# Patient Record
Sex: Female | Born: 1985 | Race: Black or African American | Hispanic: No | Marital: Single | Smoking: Current every day smoker
Health system: Southern US, Community
[De-identification: ages and names within clinical notes are randomized; demographics above are authoritative.]

## PROBLEM LIST (undated history)

## (undated) ENCOUNTER — Inpatient Hospital Stay (HOSPITAL_COMMUNITY): Payer: Self-pay

## (undated) DIAGNOSIS — F329 Major depressive disorder, single episode, unspecified: Secondary | ICD-10-CM

## (undated) DIAGNOSIS — J45909 Unspecified asthma, uncomplicated: Secondary | ICD-10-CM

## (undated) DIAGNOSIS — F32A Depression, unspecified: Secondary | ICD-10-CM

## (undated) DIAGNOSIS — R87629 Unspecified abnormal cytological findings in specimens from vagina: Secondary | ICD-10-CM

## (undated) DIAGNOSIS — F419 Anxiety disorder, unspecified: Secondary | ICD-10-CM

## (undated) DIAGNOSIS — C801 Malignant (primary) neoplasm, unspecified: Secondary | ICD-10-CM

## (undated) DIAGNOSIS — M1990 Unspecified osteoarthritis, unspecified site: Secondary | ICD-10-CM

## (undated) DIAGNOSIS — D649 Anemia, unspecified: Secondary | ICD-10-CM

## (undated) DIAGNOSIS — R519 Headache, unspecified: Secondary | ICD-10-CM

## (undated) DIAGNOSIS — I10 Essential (primary) hypertension: Secondary | ICD-10-CM

## (undated) HISTORY — PX: NO PAST SURGERIES: SHX2092

## (undated) HISTORY — DX: Unspecified abnormal cytological findings in specimens from vagina: R87.629

## (undated) HISTORY — DX: Anemia, unspecified: D64.9

---
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
## 2007-06-17 DIAGNOSIS — O42919 Preterm premature rupture of membranes, unspecified as to length of time between rupture and onset of labor, unspecified trimester: Secondary | ICD-10-CM

## 2007-12-23 ENCOUNTER — Emergency Department (HOSPITAL_COMMUNITY): Admission: EM | Admit: 2007-12-23 | Discharge: 2007-12-23 | Payer: Self-pay | Admitting: Emergency Medicine

## 2008-01-13 ENCOUNTER — Emergency Department (HOSPITAL_COMMUNITY): Admission: EM | Admit: 2008-01-13 | Discharge: 2008-01-13 | Payer: Self-pay | Admitting: Emergency Medicine

## 2009-10-06 ENCOUNTER — Ambulatory Visit (HOSPITAL_COMMUNITY): Admission: RE | Admit: 2009-10-06 | Discharge: 2009-10-06 | Payer: Self-pay | Admitting: Family Medicine

## 2009-10-13 ENCOUNTER — Ambulatory Visit (HOSPITAL_COMMUNITY)
Admission: RE | Admit: 2009-10-13 | Discharge: 2009-10-13 | Payer: Self-pay | Source: Home / Self Care | Admitting: Family Medicine

## 2010-03-16 ENCOUNTER — Emergency Department (HOSPITAL_COMMUNITY)
Admission: EM | Admit: 2010-03-16 | Discharge: 2010-03-16 | Payer: Self-pay | Source: Home / Self Care | Admitting: Emergency Medicine

## 2010-05-14 ENCOUNTER — Emergency Department (HOSPITAL_COMMUNITY)
Admission: EM | Admit: 2010-05-14 | Discharge: 2010-05-14 | Disposition: A | Discharge status: Home / Self Care | Payer: Self-pay | Attending: Emergency Medicine | Admitting: Emergency Medicine

## 2010-05-14 DIAGNOSIS — Y92009 Unspecified place in unspecified non-institutional (private) residence as the place of occurrence of the external cause: Secondary | ICD-10-CM | POA: Insufficient documentation

## 2010-05-14 DIAGNOSIS — S91109A Unspecified open wound of unspecified toe(s) without damage to nail, initial encounter: Secondary | ICD-10-CM | POA: Insufficient documentation

## 2010-05-14 DIAGNOSIS — W268XXA Contact with other sharp object(s), not elsewhere classified, initial encounter: Secondary | ICD-10-CM | POA: Insufficient documentation

## 2010-07-02 NOTE — Consult Note (Signed)
Kristin Simmons, Kristin Simmons              ACCOUNT NO.:  [id number]   MEDICAL RECORD NO.:  [id number]          PATIENT TYPE:  OIB   LOCATION:  A415                          FACILITY:  [facility]   PHYSICIAN:  Lazaro Arms, M.D.   DATE OF BIRTH:  [birth year]   DATE OF CONSULTATION:  DATE OF DISCHARGE:  11/01/2005                                   CONSULTATION   LABOR AND DELIVERY NOTE   Kristin Simmons is a 25 year old, African-American female, gravida 1, para 0, at [redacted]  weeks gestation, who came in for her third visit to the Labor and Delivery  with pre-term contractions.  Her cervix was reported to be a fingertip, 25,  and ballotable.  She was hydrated and underwent Terbutaline.  She had been  on Terbutaline but had not been taking it for about six days.  She was given  ampicillin.  She had already had betamethasone on her previous admission.  Her contractions stopped after Terbutaline protocol.  The baby was reactive.  Her cervix was rechecked.  To me, she is really closed, you cannot get a  fingertip in, but she does have a soft lower uterine segment, and the baby  is ballotable.  As a result, she is discharged to home to restart her  Terbutaline every six hours, and I will see her in the office on Friday.  She was given instructions and precautions for her term prior to that time.      Lazaro Arms, M.D.  Electronically Signed     LHE/MEDQ  D:  11/01/2005  T:  11/01/2005  Job:  [id number]

## 2010-07-02 NOTE — Consult Note (Signed)
Kristin Simmons, Kristin Simmons              ACCOUNT NO.:  [id number]   MEDICAL RECORD NO.:  [id number]          PATIENT TYPE:  EMS   LOCATION:  ED                            FACILITY:  [facility]   PHYSICIAN:  Tilda Burrow, M.D. DATE OF BIRTH:  [birth year]   DATE OF CONSULTATION:  09/09/2004  DATE OF DISCHARGE:                                   CONSULTATION   CHIEF COMPLAINT:  Stomach pain since Sunday September 05, 2004.   HISTORY OF PRESENT ILLNESS:  A 25 year old female gravida 0, para 0,  sexually active without birth control, high school drop out (kicked out)  whom I delivered 17 years ago is seen in the emergency room by Dr. Margretta Ditty  for abdominal pain.  She is afebrile with mild to moderate discomfort with  white count 11,200, 78 neutrophils, normal electrolytes, negative pregnancy  test, normal liver function tests with pelvic ultrasound performed showing  some hemorrhagic fluid in the cul-de-sac.  Mild to moderate volume.  Right  para ovarian cyst with patient currently menstruating with moderate  thickening of the endometrium consistent with menstrual flow.   PHYSICAL EXAMINATION:  Pelvic exam shows nonpurulent bloody fluid, heavy  menstrual flow volume present, uterus is nontender, nonpurulent lochia.   IMPRESSION:  Dysmenorrhea, retrograde menses versus residual fluid from  ovulation.   PLAN:  The patient is requesting to be placed on Depo Provera.  I think that  it is certainly prudent until she can be seen in the health department for  subsequent care.  Will check GC/Chlamydia culture due to unprotected  intercourse with patient to follow up through [facility]  Department for future contraception needs.   DISCHARGE MEDICATIONS:  Tylenol #3 x30 tablets.   FOLLOWUP:  Health Department.       JVF/MEDQ  D:  09/09/2004  T:  09/10/2004  Job:  [id number]

## 2010-07-02 NOTE — H&P (Signed)
NAMEALLURE, GREASER              ACCOUNT NO.:  [id number]   MEDICAL RECORD NO.:  [id number]          PATIENT TYPE:  OIB   LOCATION:  LDR2                          FACILITY:  [facility]   PHYSICIAN:  Tilda Burrow, M.D. DATE OF BIRTH:  May 24, 1985   DATE OF ADMISSION:  11/22/2005  DATE OF DISCHARGE:  LH                                HISTORY & PHYSICAL   REASON FOR ADMISSION:  Pregnancy at 34 weeks and 4 days with preterm uterine  contractions.   MEDICAL HISTORY:  Is negative.   SURGICAL HISTORY:  Is negative.   ALLERGIES:  She has no known allergies.   FAMILY HISTORY:  Positive for hypertension, breast cancer and thyroid  disease.   Blood type is O positive. UDS is positive on initial exam and on admit to  the hospital for [facility]. Rubella is immune. Hepatitis B surface antigen is  negative. HIV is nonreactive. 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 The patient is taking  prophylaxis with Valtrex 1 g p.o. daily. Serology is nonreactive. Pap  normal. GC and chlamydia are negative. AFP:  The patient declined. Twenty-  week hemoglobin 10.9, 28-week hematocrit 32.8. One-hour glucose is 89.  Sickle cell screen is negative.   PHYSICAL EXAMINATION:  VITAL SIGNS:  Are stable.  CERVIX:  Is 1 cm, 90% effaced. Presenting part is -3 station. Membranes are  noted to be bulging.   ASSESSMENT:  Preterm contractions at 34 and 4.   PLAN:  I discussed with Ebone and her mother the impact of THC use on  pregnancy, how it is related to childhood leukemia. Also discussed with her  at length her refusal to take subcu terbutaline. She states that she does  not like to take shots. She is willing to take the pills, but she thinks  that her baby will be fine to be born at this stage and will refuse any  other treatment than the pills.   We are going to monitor for another hour. If no uterine contractions,  discharge her home on the p.o. Brethine with bedrest to follow up in the  office on Friday.      Zerita Boers,  Lanier Clam      Tilda Burrow, M.D.  Electronically Signed    DL/MEDQ  D:  51/88/4166  T:  11/23/2005  Job:  [id number]

## 2010-07-02 NOTE — Op Note (Signed)
NAMESHACORIA, LATIF              ACCOUNT NO.:  [id number]   MEDICAL RECORD NO.:  [id number]          PATIENT TYPE:  INP   LOCATION:  A401                          FACILITY:  [facility]   PHYSICIAN:  Tilda Burrow, M.D. DATE OF BIRTH:  1985/12/23   DATE OF PROCEDURE:  12/11/2005  DATE OF DISCHARGE:                                 OPERATIVE REPORT   Delivery 2:45 p.m.  Roneshia progressed nicely in spontaneous labor.  Epidural was in place and she had excellent analgesic effect.  She had a  significant bradycardia which upon examination showed her to be completely  dilated.  The bradycardia responded to oxygen, position changes.  She then  was able to push nicely under the support of two family members delivering  over an intact perineum a healthy female infant Apgars 8 and 9.  Amniotic  fluid was without malodor.  There were no lacerations requiring repair,  first degree periurethral lacerations only that will heal spontaneously.   There was no nuchal cord.  The head was delivered from the right occiput  anterior position.  The right shoulder and arm delivered before the fetal  body after rotating the baby clockwise sufficiently to allow this to happen.  Cord blood gases were obtained and are documented elsewhere.  Placenta  delivered intact.  Tomasa Blase presentation, three-vessel cord confirmed,  central insertion of the cord into the placenta.  The estimated blood loss  was quite normal at 350 mL with epidural catheter removed, tip was  visualized as intact.      Tilda Burrow, M.D.  Electronically Signed     JVF/MEDQ  D:  12/11/2005  T:  12/12/2005  Job:  [id number]   cc:   Dr. Gerda Diss

## 2010-07-02 NOTE — H&P (Signed)
NAMESUMMERLYN, FICKEL              ACCOUNT NO.:  [id number]   MEDICAL RECORD NO.:  [id number]          PATIENT TYPE:  OIB   LOCATION:  LDR4                          FACILITY:  [facility]   PHYSICIAN:  Tilda Burrow, M.D. DATE OF BIRTH:  16-Apr-1985   DATE OF ADMISSION:  08/15/2005  DATE OF DISCHARGE:  LH                                HISTORY & PHYSICAL   REASON FOR ADMISSION:  Pregnancy at 22 weeks with uterine contractions.   HISTORY OF PRESENT ILLNESS:  Kristin Simmons presents to the hospital complaining of  cramping on and off during the biggest part of the morning that has not  gotten any better.   MEDICAL HISTORY:  Negative.   SURGICAL HISTORY:  Negative.   ALLERGIES:  She has no known allergies.   FAMILY HISTORY:  Positive for hypertension, breast cancer, and thyroid  disease.   PHYSICAL EXAMINATION:  Vital signs are stable.  Fetal heart rate pattern is  stable.  There is an irritable-type contraction pattern noted.  Cervix is  clear, long, firm.   PLAN:  We are going to observation admit, get a CB, a catheterized UA, UDS.  Push fluids since she said she has not had much to drink today, and if her  contractions persist an hour after pushing fluids we are going to give her  Brethine 2.5 p.o. and observe.      Zerita Boers, Lanier Clam      Tilda Burrow, M.D.  Electronically Signed    DL/MEDQ  D:  16/11/9602  T:  08/15/2005  Job:  [id number]

## 2010-07-02 NOTE — H&P (Signed)
NAMEMAURICA, OMURA              ACCOUNT NO.:  [id number]   MEDICAL RECORD NO.:  [id number]          PATIENT TYPE:  OIB   LOCATION:  LDR2                          FACILITY:  [facility]   PHYSICIAN:  Tilda Burrow, M.D. DATE OF BIRTH:  [birth year]   DATE OF ADMISSION:  11/22/2005  DATE OF DISCHARGE:  10/10/2007LH                                HISTORY & PHYSICAL   Kristin Simmons is a 25 year old, gravida 1, para 0 who is 34 weeks 5 days gestation  who came in with complaints of uterine contractions.  She has had multiple  hospital admissions for preterm contractions which never have changed her  cervix.  It appears that may be what is going on right now.  Her cervix has  been evaluated anywhere from fingertip to closed in the past and that is  what we aer getting this evening.  She is having regular mild to moderate  uterine contractions every 2-4 minutes.  Fetal heart rate is reactive with  an occasional very mild variable.  Her urinalysis is negative except for  THC.  Her vital signs are within normal limits.  She is a very petite, small-  framed female which does exaggerate her contractions to some degree.  Initially, Dalynn refused all treatments saying the Victorino Dike in our office  had changed her due date back to sometime in October placing her at [redacted] weeks  gestation.  I tried to explain to Kristin Simmons about how late pregnancy  ultrasounds are much less accurate than early pregnancy ultrasounds.  She  hung up the phone on me early in the conversation.  I did explain all of  that to her mother and explained that we would not ever change the due date  based on a third trimester ultrasound and went into quite a lot of detail as  to the reasons why.  Additionally, I explained to her that at 34-1/[redacted] weeks  gestation, there is a strong possibility of having under developed lungs,  and it was in the baby's best interest to stop the labor if we could.  After  some discussion with her mother, Kristin Simmons did  consent to some subcu  terbutaline and some Ambien but has refused any further treatment.  So, at  this point, that is what we will do.  In the past, we have been successful  at stopping her contractions with terbutaline.  Also hopefully that will be  the case tonight.  I have also offered her some IM narcotic with the hopes  of also quieting her uterus and letting her get some rest.   IMPRESSION:  1. Intrauterine pregnancy at 34-1/2 weeks, preterm contractions versus      preterm labor.  2. Patient refusal of all recommended treatment.   PLAN:  We will offer subcu terbutaline and IM narcotic and p.o. sleeping  pill which at this point is all Kristin Simmons is consenting to.  She specifically  refused an IV and any further tocolytics.      Kristin Simmons, C.N.M.      Tilda Burrow, M.D.  Electronically Signed   FC/MEDQ  D:  11/23/2005  T:  11/23/2005  Job:  [id number]

## 2010-07-02 NOTE — Consult Note (Signed)
[name], [name]              ACCOUNT NO.:  [id number]   MEDICAL RECORD NO.:  [id number]          PATIENT TYPE:  OIB   LOCATION:  A415                          FACILITY:  [facility]   PHYSICIAN:  Tilda Burrow, M.D. DATE OF BIRTH:  04-Jul-1985   DATE OF CONSULTATION:  DATE OF DISCHARGE:  11/25/2005                                   CONSULTATION   Observation x1-1/2 hours.   Kristin Simmons presents at 34 weeks 6 days complaining of pelvic discomfort.  Cervix is fingertip.  Vertex is well applied.  External monitoring shows no  evidence of active labor.  She has _uterine irritability_ and mild  contraction.  She is already on Brethine 5 mg every 4 hours.   PLAN:  To simply add Ambien prescription to her current medication regimen.  Encouraged her to take tub baths and vary activity p.r.n.  Return for  bleeding, gush of fluid, etc.      Tilda Burrow, M.D.  Electronically Signed     JVF/MEDQ  D:  11/25/2005  T:  11/26/2005  Job:  [id number]

## 2010-07-02 NOTE — Op Note (Signed)
Kristin Simmons, Kristin Simmons              ACCOUNT NO.:  [id number]   MEDICAL RECORD NO.:  [id number]          PATIENT TYPE:  INP   LOCATION:  A401                          FACILITY:  [facility]   PHYSICIAN:  Tilda Burrow, M.D. DATE OF BIRTH:  02/25/1985   DATE OF PROCEDURE:  12/11/2005  DATE OF DISCHARGE:                                 OPERATIVE REPORT   PROCEDURE PERFORMED:  Epidural catheter placement.   Continuous lumbar epidural catheter placed using loss of resistance  technique at the L3-4 interspace.  First attempt was successful.  The  patient had scoliosis which was accounted for in the angulation of the  needle which was oriented slightly cephalad and to the right.  At a depth of  4.5 cm we found the epidural space.  Had 5 mL of 1.5% Xylocaine with  epinephrine instilled, then the catheter inserted 3 cm in to the epidural  space with good analgesic effect.  There was slightly patchy effect on the  patient's left side.  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  Nonetheless, she is  maintaining good blood pressures.  Catheter was taped to the back and is  receiving a 12 mL continuous infusion, having received a 7 mL initial bolus  of 0.125% Marcaine with fentanyl solution.  T12 analgesic effect noted.      Tilda Burrow, M.D.  Electronically Signed     JVF/MEDQ  D:  12/11/2005  T:  12/12/2005  Job:  [id number]

## 2010-07-02 NOTE — H&P (Signed)
Kristin Simmons, Kristin Simmons              ACCOUNT NO.:  [id number]   MEDICAL RECORD NO.:  [id number]          PATIENT TYPE:  INP   LOCATION:  A401                          FACILITY:  [facility]   PHYSICIAN:  Tilda Burrow, M.D. DATE OF BIRTH:  Jun 10, 1985   DATE OF ADMISSION:  12/11/2005  DATE OF DISCHARGE:  LH                                HISTORY & PHYSICAL   ADMITTING DIAGNOSIS:  Pregnancy at 37 weeks 1 day latent phase labor.   HISTORY OF PRESENT ILLNESS:  This 25 year old female gravida 1, para 0, LMP?  February 25, 2005 or March 16, 2005, with ultrasound [facility] of January 02, 2006 is admitted at 65 and 1 by today's ultrasound criteria for pregnancy  course followed through our office through 12 prenatal visits with  appropriate weight gain and fundal height.  She is seen in our office with  blood type O positive, urine drug screen positive for THC, Rubella immunity  present, hemoglobin 12, hematocrit 36, hepatitis, HIV, RPR, GC, and  Chlamydia are all negative.  MSAFP declined.  Glucose tolerance test 89 mg  percent, Sickledex negative.  She is admitted for labor management after  presenting early in the morning hours of the 27th of October with  contraction discomfort.  She is having a slow labor prodromal pattern with  long contractions lasting almost 2 minutes occurring about every 10 minutes.  These are barely indentable without any uterine tenderness between  contractions.  Cervical exam shows the cervix to be 2 cm, 100%, -1.  Membrane rupture is performed and cervix easily stretches to 4 cm.  She is  admitted for labor management.   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   SURGICAL HISTORY:  Negative.   ALLERGIES:  None.   MEDICATIONS:  None.   SOCIAL HISTORY:  A high school dropout.  Supportive partner, Scotty Court,  23 years-old. This is her first child.   PHYSICAL EXAMINATION:  GENERAL:  General exam shows a healthy-appearing  African American  female alert and oriented x3, somnolent at this time of  exam at 9:30 a.m.  HEENT:  Pupils equal, round, and reactive.  CARDIOVASCULAR:  Exam unremarkable.  ABDOMEN:  Term-size fetus at 36-37 cm, estimated fetal weight 6 pounds.  CERVIX:  4 cm, 100%, -1, vertex, mid position.  Scalp electrode in place.  Reactive fetal heart rate tracing without decelerations.   PLAN:  Allow to labor spontaneously.  May require Pitocin augmentation  later.  Epidural most likely.  The patient plans to bottle feed.  Has a  suspected female child.      Tilda Burrow, M.D.  Electronically Signed     JVF/MEDQ  D:  12/11/2005  T:  12/12/2005  Job:  [id number]   cc:   [facility]   Scott A. Gerda Diss, MD  Fax: [phone]

## 2010-07-02 NOTE — Consult Note (Signed)
Kristin Simmons, Kristin Simmons              ACCOUNT NO.:  [id number]   MEDICAL RECORD NO.:  [id number]          PATIENT TYPE:  OIB   LOCATION:  LDR1                          FACILITY:  [facility]   PHYSICIAN:  Tilda Burrow, M.D. DATE OF BIRTH:  [birth year]   DATE OF CONSULTATION:  DATE OF DISCHARGE:                                   CONSULTATION   CHIEF COMPLAINT:  Contractions.   HISTORY:  This 25 year old female, gravida 1, para 0 with very petite  stature and extremely prominent uterus within her slim body habitus is seen  in Labor and Delivery at [redacted] weeks gestation, complaining of uterine  contractions. She has an extremely unusual uterine contraction pattern. She  will have multiple bursts of contractions over a 10 minute time frame with 5  or 6 small contractions that palpably are mild to moderate in intensity,  followed by 5-10 minutes of no contractions.  This has been going on for two  hours of observation. Urinalysis is negative.  She will be admitted at this  time for terbutaline protocol.   PAST MEDICAL HISTORY:  Benign.   PAST SURGICAL HISTORY:  Negative.   EARLY CHILDHOOD:  Notable for air pocket on her side, unknown if this  represents pneumothorax or something else.   SOCIAL HISTORY:  She smokes cigarettes, four per day.  Alcohol, denied.  Recreational drugs denied but THC positive in her urine at her initial  visit. She is going for her GED.  Supportive family structure.   PHYSICAL EXAMINATION:  GENERAL:  A healthy appearing fair-complected African-  American female, alert, oriented x 3 in no acute distress.  ABDOMEN:  Palpably barely identifiable contractions on the very protuberant  little uterus.   PLAN:  Terbutaline protocol.      Tilda Burrow, M.D.  Electronically Signed     JVF/MEDQ  D:  10/17/2005  T:  10/17/2005  Job:  [id number]

## 2010-07-02 NOTE — Discharge Summary (Signed)
[name], Kristin Simmons              ACCOUNT NO.:  [id number]   MEDICAL RECORD NO.:  [id number]          PATIENT TYPE:  OIB   LOCATION:  LDR4                          FACILITY:  [facility]   PHYSICIAN:  Tilda Burrow, M.D. DATE OF BIRTH:  1985/03/01   DATE OF ADMISSION:  08/15/2005  DATE OF DISCHARGE:  07/02/2007LH                                 DISCHARGE SUMMARY   ADMITTING DIAGNOSES:  1.  Pregnancy 22 weeks.  2.  Uterine contractions.  3.  Rule out urinary tract infection.   HOSPITAL SUMMARY:  Patient was admitted for cramping all morning with  patient concern resulting in her presenting to the hospital for evaluation  on August 15, 2005.   The patient had a catheterized urinalysis performed which revealed  moderate  hemoglobin, large nitrites, and large leukocyte esterase suggesting urinary  tract infection.  Urine drug screen was positive for cannabinoids.  White  count was normal at 8900, hemoglobin 10, hematocrit 33.  After the diagnosis  of urinary tract infection was made the patient was subsequently discharged  home four and a half hours after initial admission on Macrobid 100 mg b.i.d.  x1 week with follow-up as scheduled in the office.   ADDENDUM:  Urinalysis returns showing 10 to the fifth E. coli sensitive to  all agents including Macrodantin.      Tilda Burrow, M.D.  Electronically Signed     JVF/MEDQ  D:  09/01/2005  T:  09/02/2005  Job:  [id number]

## 2010-07-02 NOTE — Discharge Summary (Signed)
Kristin Simmons, Kristin Simmons              ACCOUNT NO.:  [id number]   MEDICAL RECORD NO.:  [id number]          PATIENT TYPE:  OIB   LOCATION:  LDR1                          FACILITY:  [facility]   PHYSICIAN:  Tilda Burrow, M.D. DATE OF BIRTH:  1985-11-14   DATE OF ADMISSION:  10/17/2005  DATE OF DISCHARGE:  LH                                 DISCHARGE SUMMARY   ADMISSION DIAGNOSIS:  Contractions.   DISCHARGE DIAGNOSES:  1. Preterm labor, resolved.  2. Pregnancy at [redacted] weeks gestation, not delivered.   PROCEDURE PERFORMED:  1. Terbutaline protocol tocolysis.  2. Betamethasone injection times one.   DISCHARGE INSTRUCTIONS:  Follow-up in 24 hours at [facility] for  second betamethasone injection.   HOSPITAL COURSE:  This 25 year old primiparous petite female was admitted  for terbutaline tocolysis after admitting with contraction pattern that is  atypical, but palpably mild to moderate intensity contractions.  The cervix  remains closed.  Urinalysis is negative.  The patient is afebrile with  normal fetal heart rate.   The patient was admitted and placed on terbutaline protocol.  Received group  beta Streptococcus culture.  Betamethasone injection times one.  She was  kept overnight and responded to the four doses of subcutaneous terbutaline,  then switched to oral terbutaline.  She will be discharged home on  terbutaline 2.5 mg per oral every six hours times one week and then  discontinue the terbutaline at that point.  Follow-up in 24 hours for a  second betamethasone shot.      Tilda Burrow, M.D.  Electronically Signed     JVF/MEDQ  D:  10/18/2005  T:  10/18/2005  Job:  [id number]   cc:   [facility]

## 2010-09-08 ENCOUNTER — Emergency Department (HOSPITAL_COMMUNITY)
Admission: EM | Admit: 2010-09-08 | Discharge: 2010-09-08 | Disposition: A | Discharge status: Home / Self Care | Payer: Self-pay | Attending: Emergency Medicine | Admitting: Emergency Medicine

## 2010-09-08 ENCOUNTER — Emergency Department (HOSPITAL_COMMUNITY): Payer: Self-pay

## 2010-09-08 DIAGNOSIS — F17200 Nicotine dependence, unspecified, uncomplicated: Secondary | ICD-10-CM | POA: Insufficient documentation

## 2010-09-08 DIAGNOSIS — J40 Bronchitis, not specified as acute or chronic: Secondary | ICD-10-CM | POA: Insufficient documentation

## 2010-09-08 DIAGNOSIS — J069 Acute upper respiratory infection, unspecified: Secondary | ICD-10-CM | POA: Insufficient documentation

## 2010-09-08 DIAGNOSIS — I10 Essential (primary) hypertension: Secondary | ICD-10-CM | POA: Insufficient documentation

## 2010-09-08 HISTORY — DX: Essential (primary) hypertension: I10

## 2010-09-08 LAB — URINALYSIS, ROUTINE W REFLEX MICROSCOPIC
Glucose, UA: NEGATIVE mg/dL
Leukocytes, UA: NEGATIVE
Specific Gravity, Urine: 1.025 (ref 1.005–1.030)
Urobilinogen, UA: 2 mg/dL — ABNORMAL HIGH (ref 0.0–1.0)

## 2010-09-08 MED ORDER — BENZONATATE 100 MG PO CAPS: 100.0000 mg | ORAL_CAPSULE | Freq: Three times a day (TID) | ORAL | Status: AC

## 2010-09-08 MED ORDER — ALBUTEROL SULFATE HFA 108 (90 BASE) MCG/ACT IN AERS
2.0000 | INHALATION_SPRAY | RESPIRATORY_TRACT | Status: AC
  Administered 2010-09-08: 2 via RESPIRATORY_TRACT
  Filled 2010-09-08: qty 6.7

## 2010-09-08 NOTE — ED Notes (Signed)
Persistent cough for one week, cannot sleep. No other sx

## 2010-09-08 NOTE — ED Notes (Deleted)
Also vomiting intermittently for one week

## 2010-09-08 NOTE — ED Provider Notes (Signed)
History     Chief Complaint  Patient presents with  . Cough   Patient is a 25 y.o. female presenting with cough. The history is provided by the patient.  Cough The current episode started more than 2 days ago. The problem has not changed since onset.The cough is non-productive. There has been no fever. Pertinent negatives include no chest pain, no chills, no sweats, no weight loss, no ear pain, no sore throat, no myalgias, no shortness of breath and no wheezing. She has tried nothing for the symptoms. She is a smoker.  Pt was seen at 0625.  Per pt, c/o gradual onset and persistence of intermittent cough x5 days.  Denies fevers, no CP/SOB, no rash, no abd pain, no N/V/D.     Past Medical History  Diagnosis Date  . Hypertension     History reviewed. No pertinent past surgical history.  History reviewed. No pertinent family history.  History  Substance Use Topics  . Smoking status: Current Everyday Smoker -- 1.0 packs/day  . Smokeless tobacco: Not on file  . Alcohol Use: 10.2 oz/week    17 Shots of liquor per week    OB History    Grav Para Term Preterm Abortions TAB SAB Ect Mult Living                  Review of Systems  Constitutional: Negative for chills and weight loss.  HENT: Negative for ear pain and sore throat.   Respiratory: Positive for cough. Negative for shortness of breath and wheezing.   Cardiovascular: Negative for chest pain.  Musculoskeletal: Negative for myalgias.   ROS: Statement: All systems negative except as marked or noted in the HPI; Constitutional: Negative for fever and chills. ; ; Eyes: Negative for eye pain and discharge. ; ; ENMT: Negative for ear pain, hoarseness, nasal congestion, sinus pressure and sore throat. ; ; Cardiovascular: Negative for chest pain, palpitations, diaphoresis, dyspnea and peripheral edema. ; ; Respiratory: +cough, Negative for wheezing and stridor. ; ; Gastrointestinal: Negative for nausea, vomiting, diarrhea and abdominal  pain. ; ; Genitourinary: Negative for dysuria, flank pain and hematuria. ; ; Musculoskeletal: Negative for back pain and neck pain. ; ; Skin: Negative for rash and skin lesion. ; ; Neuro: Negative for headache, lightheadedness and neck stiffness. ;    Physical Exam  BP 134/102  Pulse 71  Temp(Src) 98.9 F (37.2 C) (Oral)  Resp 16  Ht 5\' 2"  (1.575 m)  Wt 188 lb (85.276 kg)  BMI 34.39 kg/m2  SpO2 100%  LMP 08/21/2010  Physical Exam 0635: Physical examination:  Nursing notes reviewed; Vital signs and O2 SAT reviewed;  Constitutional: Well developed, Well nourished, Well hydrated, In no acute distress; Head:  Normocephalic, atraumatic; Eyes: EOMI, PERRL, No scleral icterus; ENMT: Mouth and pharynx normal, Mucous membranes moist, +edemetous nasal turbinates bilat with clear rhinorrhea. ; Neck: Supple, Full range of motion, No lymphadenopathy; Cardiovascular: Regular rate and rhythm, No murmur, rub, or gallop; Respiratory: Breath sounds clear & equal bilaterally, No rales, rhonchi, wheezes, or rub, Normal respiratory effort/excursion; Chest: Nontender, Movement normal; Abdomen: Soft, Nontender, Nondistended, Normal bowel sounds; Genitourinary: No CVA tenderness; Extremities: Pulses normal, No tenderness, No edema, No calf edema or asymmetry.; Neuro: AA&Ox3, Major CN grossly intact.  No gross focal motor or sensory deficits in extremities.; Skin: Color normal, Warm, Dry   ED Course  Procedures  MDM  MDM Reviewed: nursing note and vitals Interpretation: labs and x-ray   Results for orders placed  during the hospital encounter of 09/08/10  URINALYSIS, ROUTINE W REFLEX MICROSCOPIC      Component Value Range   Color, Urine YELLOW  YELLOW    Appearance CLEAR  CLEAR    Specific Gravity, Urine 1.025  1.005 - 1.030    pH 6.0  5.0 - 8.0    Glucose, UA NEGATIVE  NEGATIVE (mg/dL)   Hgb urine dipstick NEGATIVE  NEGATIVE    Bilirubin Urine NEGATIVE  NEGATIVE    Ketones, ur TRACE (*) NEGATIVE  (mg/dL)   Protein, ur NEGATIVE  NEGATIVE (mg/dL)   Urobilinogen, UA 2.0 (*) 0.0 - 1.0 (mg/dL)   Nitrite NEGATIVE  NEGATIVE    Leukocytes, UA NEGATIVE  NEGATIVE   PREGNANCY, URINE      Component Value Range   Preg Test, Ur NEGATIVE     Dg Chest 2 View  09/08/2010  *RADIOLOGY REPORT*  Clinical Data: Cough.  CHEST - 2 VIEW  Comparison: 12/23/2007  Findings: The heart size and vascularity are normal and the lungs are clear except for slight peribronchial thickening consistent with bronchitis.  No effusions.  No significant osseous abnormality.  IMPRESSION: Mild bronchitic changes.  Original Report Authenticated By: Gwynn Burly, M.D.    7:58 AM:  No pneumonia on CXR.  Likely bronchitis, URI.  Wants to go home now.  Given MDI here teach and treat.  Dx testing d/w pt.  Questions answered.  Verb understanding, agreeable to d/c home with outpt f/u  [facility] Allison Quarry, DO 09/08/10 1609

## 2010-09-08 NOTE — ED Notes (Signed)
Pt to xray

## 2010-11-16 LAB — POCT I-STAT, CHEM 8
BUN: 7
Calcium, Ion: 1.19
Chloride: 104
Hemoglobin: 16 — ABNORMAL HIGH
TCO2: 28

## 2010-11-17 ENCOUNTER — Encounter (HOSPITAL_COMMUNITY): Payer: Self-pay | Admitting: *Deleted

## 2010-11-17 ENCOUNTER — Emergency Department (HOSPITAL_COMMUNITY)
Admission: EM | Admit: 2010-11-17 | Discharge: 2010-11-18 | Disposition: A | Discharge status: Home / Self Care | Payer: Self-pay | Attending: Emergency Medicine | Admitting: Emergency Medicine

## 2010-11-17 DIAGNOSIS — H9209 Otalgia, unspecified ear: Secondary | ICD-10-CM | POA: Insufficient documentation

## 2010-11-17 DIAGNOSIS — H9210 Otorrhea, unspecified ear: Secondary | ICD-10-CM | POA: Insufficient documentation

## 2010-11-17 DIAGNOSIS — H60399 Other infective otitis externa, unspecified ear: Secondary | ICD-10-CM | POA: Insufficient documentation

## 2010-11-17 DIAGNOSIS — H6090 Unspecified otitis externa, unspecified ear: Secondary | ICD-10-CM

## 2010-11-17 DIAGNOSIS — Z79899 Other long term (current) drug therapy: Secondary | ICD-10-CM | POA: Insufficient documentation

## 2010-11-17 DIAGNOSIS — F17200 Nicotine dependence, unspecified, uncomplicated: Secondary | ICD-10-CM | POA: Insufficient documentation

## 2010-11-17 MED ORDER — PENICILLIN V POTASSIUM 250 MG PO TABS
500.0000 mg | ORAL_TABLET | Freq: Once | ORAL | Status: AC
  Administered 2010-11-17: 500 mg via ORAL
  Filled 2010-11-17: qty 2

## 2010-11-17 MED ORDER — NEOMYCIN-POLYMYXIN-HC 3.5-10000-1 OT SOLN
3.0000 [drp] | Freq: Three times a day (TID) | OTIC | Status: DC
  Administered 2010-11-17: 3 [drp] via OTIC
  Filled 2010-11-17: qty 10

## 2010-11-17 MED ORDER — PENICILLIN V POTASSIUM 500 MG PO TABS: ORAL_TABLET | ORAL | Status: DC

## 2010-11-17 MED ORDER — ONDANSETRON HCL 4 MG PO TABS
4.0000 mg | ORAL_TABLET | Freq: Once | ORAL | Status: AC
  Administered 2010-11-17: 4 mg via ORAL
  Filled 2010-11-17: qty 1

## 2010-11-17 MED ORDER — TRAMADOL HCL 50 MG PO TABS
100.0000 mg | ORAL_TABLET | Freq: Once | ORAL | Status: AC
  Administered 2010-11-17: 100 mg via ORAL
  Filled 2010-11-17: qty 2

## 2010-11-17 MED ORDER — TRAMADOL HCL 50 MG PO TABS: ORAL_TABLET | ORAL | Status: DC

## 2010-11-17 NOTE — ED Provider Notes (Signed)
History     CSN: [id number] Arrival date & time: 11/17/2010 10:53 PM  Chief Complaint  Patient presents with  . Otalgia    (Consider location/radiation/quality/duration/timing/severity/associated sxs/prior treatment) Patient is a 25 y.o. female presenting with ear pain. The history is provided by the patient.  Otalgia This is a new problem. The current episode started 2 days ago. There is pain in the left ear. The problem occurs daily. The problem has been gradually worsening. The pain is moderate. Associated symptoms include ear discharge. Pertinent negatives include no hearing loss, no abdominal pain, no neck pain and no cough.    Past Medical History  Diagnosis Date  . Hypertension     History reviewed. No pertinent past surgical history.  History reviewed. No pertinent family history.  History  Substance Use Topics  . Smoking status: Current Everyday Smoker -- 1.0 packs/day  . Smokeless tobacco: Not on file  . Alcohol Use: 10.2 oz/week    17 Shots of liquor per week    OB History    Grav Para Term Preterm Abortions TAB SAB Ect Mult Living                  Review of Systems  Constitutional: Negative for activity change.       All ROS Neg except as noted in HPI  HENT: Positive for ear pain and ear discharge. Negative for hearing loss, nosebleeds and neck pain.   Eyes: Negative for photophobia and discharge.  Respiratory: Negative for cough, shortness of breath and wheezing.   Cardiovascular: Negative for chest pain and palpitations.  Gastrointestinal: Negative for abdominal pain and blood in stool.  Genitourinary: Negative for dysuria, frequency and hematuria.  Musculoskeletal: Negative for back pain and arthralgias.  Skin: Negative.   Neurological: Negative for dizziness, seizures and speech difficulty.  Psychiatric/Behavioral: Negative for hallucinations and confusion.    Allergies  Latex  Home Medications   Current Outpatient Rx  Name Route Sig  Dispense Refill  . ALBUTEROL SULFATE HFA 108 (90 BASE) MCG/ACT IN AERS Inhalation Inhale 2 puffs into the lungs every 6 (six) hours as needed. For asthma     . ETONOGESTREL 68 MG [place] IMPL Subcutaneous Inject 1 each into the skin once.        BP 125/86  Pulse 91  Temp(Src) 98.5 F (36.9 C) (Oral)  Resp 18  Ht 5\' 1"  (1.549 m)  Wt 99 lb (44.906 kg)  BMI 18.71 kg/m2  SpO2 100%  LMP 11/12/2010  Physical Exam  Nursing note and vitals reviewed. Constitutional: She is oriented to person, place, and time. She appears well-developed and well-nourished.  Non-toxic appearance.  HENT:  Head: Normocephalic.  Right Ear: Tympanic membrane and external ear normal.  Left Ear: Tympanic membrane normal.       Pt has a small abscess of the external canal of the left ear. No red streaks or swollen lymph nodes around the ear.  Eyes: EOM and lids are normal. Pupils are equal, round, and reactive to light.  Neck: Normal range of motion. Neck supple. Carotid bruit is not present.  Cardiovascular: Normal rate, regular rhythm, normal heart sounds, intact distal pulses and normal pulses.   Pulmonary/Chest: Breath sounds normal. No respiratory distress.  Abdominal: Soft. Bowel sounds are normal. There is no tenderness. There is no guarding.  Musculoskeletal: Normal range of motion.  Lymphadenopathy:       Head (right side): No submandibular adenopathy present.       Head (left side):  No submandibular adenopathy present.    She has no cervical adenopathy.  Neurological: She is alert and oriented to person, place, and time. She has normal strength. No cranial nerve deficit or sensory deficit.  Skin: Skin is warm and dry.  Psychiatric: She has a normal mood and affect. Her speech is normal.    ED Course  Procedures (including critical care time)  Labs Reviewed - No data to display No results found.   Dx: Abscess left external auditory canal   MDM  I have reviewed nursing notes, vital signs, and all  appropriate lab and imaging results for this patient.        Kathie Dike, [place] 11/22/10 [phone]

## 2010-11-17 NOTE — ED Notes (Signed)
Patient with left ear pain

## 2010-11-23 NOTE — ED Provider Notes (Signed)
Medical screening examination/treatment/procedure(s) were performed by non-physician practitioner and as supervising physician I was immediately available for consultation/collaboration.   Vida Roller, MD 11/23/10 [phone]

## 2011-06-01 ENCOUNTER — Emergency Department (HOSPITAL_COMMUNITY)
Admission: EM | Admit: 2011-06-01 | Discharge: 2011-06-01 | Disposition: A | Discharge status: Home / Self Care | Payer: Self-pay | Attending: Emergency Medicine | Admitting: Emergency Medicine

## 2011-06-01 ENCOUNTER — Encounter (HOSPITAL_COMMUNITY): Payer: Self-pay | Admitting: *Deleted

## 2011-06-01 DIAGNOSIS — M549 Dorsalgia, unspecified: Secondary | ICD-10-CM | POA: Insufficient documentation

## 2011-06-01 DIAGNOSIS — N390 Urinary tract infection, site not specified: Secondary | ICD-10-CM | POA: Insufficient documentation

## 2011-06-01 DIAGNOSIS — F17200 Nicotine dependence, unspecified, uncomplicated: Secondary | ICD-10-CM | POA: Insufficient documentation

## 2011-06-01 DIAGNOSIS — Z79899 Other long term (current) drug therapy: Secondary | ICD-10-CM | POA: Insufficient documentation

## 2011-06-01 DIAGNOSIS — R52 Pain, unspecified: Secondary | ICD-10-CM | POA: Insufficient documentation

## 2011-06-01 DIAGNOSIS — I10 Essential (primary) hypertension: Secondary | ICD-10-CM | POA: Insufficient documentation

## 2011-06-01 LAB — URINALYSIS, ROUTINE W REFLEX MICROSCOPIC
Bilirubin Urine: NEGATIVE
Glucose, UA: NEGATIVE mg/dL
Ketones, ur: NEGATIVE mg/dL
Protein, ur: 100 mg/dL — AB
pH: 6 (ref 5.0–8.0)

## 2011-06-01 LAB — URINE MICROSCOPIC-ADD ON

## 2011-06-01 MED ORDER — NITROFURANTOIN MONOHYD MACRO 100 MG PO CAPS
100.0000 mg | ORAL_CAPSULE | Freq: Once | ORAL | Status: AC
  Administered 2011-06-01: 100 mg via ORAL
  Filled 2011-06-01: qty 1

## 2011-06-01 MED ORDER — IBUPROFEN 400 MG PO TABS
600.0000 mg | ORAL_TABLET | Freq: Once | ORAL | Status: AC
  Administered 2011-06-01: 600 mg via ORAL
  Filled 2011-06-01 (×2): qty 1

## 2011-06-01 MED ORDER — NITROFURANTOIN MONOHYD MACRO 100 MG PO CAPS: 100.0000 mg | ORAL_CAPSULE | Freq: Two times a day (BID) | ORAL | Status: AC

## 2011-06-01 NOTE — Discharge Instructions (Signed)
Back Pain, Adult Low back pain is very common. About 1 in 5 people have back pain.The cause of low back pain is rarely dangerous. The pain often gets better over time.About half of people with a sudden onset of back pain feel better in just 2 weeks. About 8 in 10 people feel better by 6 weeks.  CAUSES Some common causes of back pain include:  Strain of the muscles or ligaments supporting the spine.   Wear and tear (degeneration) of the spinal discs.   Arthritis.   Direct injury to the back.  DIAGNOSIS Most of the time, the direct cause of low back pain is not known.However, back pain can be treated effectively even when the exact cause of the pain is unknown.Answering your caregiver's questions about your overall health and symptoms is one of the most accurate ways to make sure the cause of your pain is not dangerous. If your caregiver needs more information, he or she may order lab work or imaging tests (X-rays or MRIs).However, even if imaging tests show changes in your back, this usually does not require surgery. HOME CARE INSTRUCTIONS For many people, back pain returns.Since low back pain is rarely dangerous, it is often a condition that people can learn to manageon their own.   Remain active. It is stressful on the back to sit or stand in one place. Do not sit, drive, or stand in one place for more than 30 minutes at a time. Take short walks on level surfaces as soon as pain allows.Try to increase the length of time you walk each day.   Do not stay in bed.Resting more than 1 or 2 days can delay your recovery.   Do not avoid exercise or work.Your body is made to move.It is not dangerous to be active, even though your back may hurt.Your back will likely heal faster if you return to being active before your pain is gone.   Pay attention to your body when you bend and lift. Many people have less discomfortwhen lifting if they bend their knees, keep the load close to their  bodies,and avoid twisting. Often, the most comfortable positions are those that put less stress on your recovering back.   Find a comfortable position to sleep. Use a firm mattress and lie on your side with your knees slightly bent. If you lie on your back, put a pillow under your knees.   Only take over-the-counter or prescription medicines as directed by your caregiver. Over-the-counter medicines to reduce pain and inflammation are often the most helpful.Your caregiver may prescribe muscle relaxant drugs.These medicines help dull your pain so you can more quickly return to your normal activities and healthy exercise.   Put ice on the injured area.   Put ice in a plastic bag.   Place a towel between your skin and the bag.   Leave the ice on for 15 to 20 minutes, 3 to 4 times a day for the first 2 to 3 days. After that, ice and heat may be alternated to reduce pain and spasms.   Ask your caregiver about trying back exercises and gentle massage. This may be of some benefit.   Avoid feeling anxious or stressed.Stress increases muscle tension and can worsen back pain.It is important to recognize when you are anxious or stressed and learn ways to manage it.Exercise is a great option.  SEEK MEDICAL CARE IF:  You have pain that is not relieved with rest or medicine.   You have   pain that does not improve in 1 week.   You have new symptoms.   You are generally not feeling well.  SEEK IMMEDIATE MEDICAL CARE IF:   You have pain that radiates from your back into your legs.   You develop new bowel or bladder control problems.   You have unusual weakness or numbness in your arms or legs.   You develop nausea or vomiting.   You develop abdominal pain.   You feel faint.  Document Released: 01/31/2005 Document Revised: 01/20/2011 Document Reviewed: 06/21/2010 [facility] Patient Information 2012 [place], [place].Urinary Tract Infection Infections of the urinary tract can start in several  places. A bladder infection (cystitis), a kidney infection (pyelonephritis), and a prostate infection (prostatitis) are different types of urinary tract infections (UTIs). They usually get better if treated with medicines (antibiotics) that kill germs. Take all the medicine until it is gone. You or your child may feel better in a few days, but TAKE ALL MEDICINE or the infection may not respond and may become more difficult to treat. HOME CARE INSTRUCTIONS   Drink enough water and fluids to keep the urine clear or pale yellow. Cranberry juice is especially recommended, in addition to large amounts of water.   Avoid caffeine, tea, and carbonated beverages. They tend to irritate the bladder.   Alcohol may irritate the prostate.   Only take over-the-counter or prescription medicines for pain, discomfort, or fever as directed by your caregiver.  To prevent further infections:  Empty the bladder often. Avoid holding urine for long periods of time.   After a bowel movement, women should cleanse from front to back. Use each tissue only once.   Empty the bladder before and after sexual intercourse.  FINDING OUT THE RESULTS OF YOUR TEST Not all test results are available during your visit. If your or your child's test results are not back during the visit, make an appointment with your caregiver to find out the results. Do not assume everything is normal if you have not heard from your caregiver or the medical facility. It is important for you to follow up on all test results. SEEK MEDICAL CARE IF:   There is back pain.   Your baby is older than 3 months with a rectal temperature of 100.5 F (38.1 C) or higher for more than 1 day.   Your or your child's problems (symptoms) are no better in 3 days. Return sooner if you or your child is getting worse.  SEEK IMMEDIATE MEDICAL CARE IF:   There is severe back pain or lower abdominal pain.   You or your child develops chills.   You have a fever.    Your baby is older than 3 months with a rectal temperature of 102 F (38.9 C) or higher.   Your baby is 49 months old or younger with a rectal temperature of 100.4 F (38 C) or higher.   There is nausea or vomiting.   There is continued burning or discomfort with urination.  MAKE SURE YOU:   Understand these instructions.   Will watch your condition.   Will get help right away if you are not doing well or get worse.  Document Released: 11/10/2004 Document Revised: 01/20/2011 Document Reviewed: 06/15/2006 [facility] Patient Information 2012 [place], [place].  Take the antibiotic as directed.  Drink plenty of fluids.  Follow up with your MD as needed.

## 2011-06-01 NOTE — ED Provider Notes (Signed)
History     CSN: [id number]  Arrival date & time 06/01/11  [id number]   First MD Initiated Contact with Patient 06/01/11 [phone]      Chief Complaint  Patient presents with  . Back Pain    (Consider location/radiation/quality/duration/timing/severity/associated sxs/prior treatment) HPI Comments: Denies fever or UTI sxs.  No radiation.  No obvious hematuria.  No h/o kidney stones.  Patient is a 26 y.o. female presenting with back pain. The history is provided by the patient. No language interpreter was used.  Back Pain  This is a new problem. The current episode started 3 to 5 hours ago. The problem occurs constantly. The problem has not changed since onset.The pain is associated with no known injury. Pain location: R CVA region. The pain is moderate. The pain is the same all the time. Pertinent negatives include no fever and no dysuria. She has tried nothing for the symptoms.    Past Medical History  Diagnosis Date  . Hypertension     History reviewed. No pertinent past surgical history.  No family history on file.  History  Substance Use Topics  . Smoking status: Current Everyday Smoker -- 1.0 packs/day  . Smokeless tobacco: Not on file  . Alcohol Use: 10.2 oz/week    17 Shots of liquor per week    OB History    Grav Para Term Preterm Abortions TAB SAB Ect Mult Living                  Review of Systems  Constitutional: Negative for fever and chills.  Genitourinary: Negative for dysuria, urgency, frequency, hematuria, decreased urine volume and vaginal bleeding.  Musculoskeletal: Positive for back pain.  All other systems reviewed and are negative.    Allergies  Latex  Home Medications   Current Outpatient Rx  Name Route Sig Dispense Refill  . ALBUTEROL SULFATE HFA 108 (90 BASE) MCG/ACT IN AERS Inhalation Inhale 2 puffs into the lungs every 6 (six) hours as needed. For asthma     . ETONOGESTREL 68 MG [place] IMPL Subcutaneous Inject 1 each into the skin once.      Marland Kitchen  NITROFURANTOIN MONOHYD MACRO 100 MG PO CAPS Oral Take 1 capsule (100 mg total) by mouth 2 (two) times daily. 10 capsule 0  . PENICILLIN V POTASSIUM 500 MG PO TABS  2 po bid with food 28 tablet 0  . TRAMADOL HCL 50 MG PO TABS  1 po q6h prn pain 15 tablet 0    BP 133/89  Pulse 79  Temp(Src) 98.1 F (36.7 C) (Oral)  Resp 16  Ht 5\' 1"  (1.549 m)  Wt 112 lb (50.803 kg)  BMI 21.16 kg/m2  SpO2 100%  Physical Exam  Nursing note and vitals reviewed. Constitutional: She is oriented to person, place, and time. She appears well-developed and well-nourished. No distress.  HENT:  Head: Normocephalic and atraumatic.  Eyes: EOM are normal.  Neck: Normal range of motion.  Cardiovascular: Normal rate, regular rhythm and normal heart sounds.   Pulmonary/Chest: Effort normal and breath sounds normal.  Abdominal: Soft. She exhibits no distension. There is no tenderness.  Genitourinary: No vaginal discharge found.  Musculoskeletal: Normal range of motion.       Arms:      Dull pain.   No PT.  nop radiation   Neurological: She is alert and oriented to person, place, and time.  Skin: Skin is warm and dry.  Psychiatric: She has a normal mood and affect. Judgment normal.  ED Course  Procedures (including critical care time)  Labs Reviewed  URINALYSIS, ROUTINE W REFLEX MICROSCOPIC - Abnormal; Notable for the following:    Hgb urine dipstick LARGE (*)    Protein, ur 100 (*)    Nitrite POSITIVE (*)    Leukocytes, UA TRACE (*)    All other components within normal limits  URINE MICROSCOPIC-ADD ON - Abnormal; Notable for the following:    Squamous Epithelial / LPF FEW (*)    Bacteria, UA MANY (*)    All other components within normal limits  URINE CULTURE   No results found.   1. UTI (lower urinary tract infection)   2. Back pain, acute       MDM  Back pain secondary to UTI.   rx-macrobid, 10 Ibuprofen Increase fluid intake.   F/u with [facility] prn        Worthy Rancher,  PA 06/01/11 1119

## 2011-06-01 NOTE — ED Notes (Signed)
Woke up this morning with lower right sided back pain radiating to RLQ.  Denies injury.  Denies n/v/d.  Also c/o knot above umbilicus.

## 2011-06-01 NOTE — ED Provider Notes (Signed)
Medical screening examination/treatment/procedure(s) were performed by non-physician practitioner and as supervising physician I was immediately available for consultation/collaboration.  Doug Sou, MD 06/01/11 1655

## 2011-06-03 LAB — URINE CULTURE

## 2011-06-04 NOTE — ED Notes (Signed)
Patient treated with Macrobid. Sensitive to same. Per protocol MD.

## 2012-09-04 ENCOUNTER — Emergency Department (HOSPITAL_COMMUNITY): Payer: Medicaid Other

## 2012-09-04 ENCOUNTER — Emergency Department (HOSPITAL_COMMUNITY)
Admission: EM | Admit: 2012-09-04 | Discharge: 2012-09-04 | Discharge status: Against Medical Advice | Payer: Medicaid Other | Attending: Emergency Medicine | Admitting: Emergency Medicine

## 2012-09-04 ENCOUNTER — Encounter (HOSPITAL_COMMUNITY): Payer: Self-pay

## 2012-09-04 DIAGNOSIS — R109 Unspecified abdominal pain: Secondary | ICD-10-CM

## 2012-09-04 DIAGNOSIS — R1013 Epigastric pain: Secondary | ICD-10-CM | POA: Insufficient documentation

## 2012-09-04 DIAGNOSIS — R52 Pain, unspecified: Secondary | ICD-10-CM | POA: Insufficient documentation

## 2012-09-04 DIAGNOSIS — F17200 Nicotine dependence, unspecified, uncomplicated: Secondary | ICD-10-CM | POA: Insufficient documentation

## 2012-09-04 DIAGNOSIS — Z3202 Encounter for pregnancy test, result negative: Secondary | ICD-10-CM | POA: Insufficient documentation

## 2012-09-04 DIAGNOSIS — R0602 Shortness of breath: Secondary | ICD-10-CM | POA: Insufficient documentation

## 2012-09-04 DIAGNOSIS — I10 Essential (primary) hypertension: Secondary | ICD-10-CM | POA: Insufficient documentation

## 2012-09-04 DIAGNOSIS — Z79899 Other long term (current) drug therapy: Secondary | ICD-10-CM | POA: Insufficient documentation

## 2012-09-04 DIAGNOSIS — R079 Chest pain, unspecified: Secondary | ICD-10-CM | POA: Insufficient documentation

## 2012-09-04 DIAGNOSIS — Z91040 Latex allergy status: Secondary | ICD-10-CM | POA: Insufficient documentation

## 2012-09-04 LAB — CBC WITH DIFFERENTIAL/PLATELET
Eosinophils Absolute: 0.6 10*3/uL (ref 0.0–0.7)
Hemoglobin: 13.5 g/dL (ref 12.0–15.0)
Lymphocytes Relative: 34 % (ref 12–46)
Lymphs Abs: 2.3 10*3/uL (ref 0.7–4.0)
MCH: 31.5 pg (ref 26.0–34.0)
Monocytes Relative: 6 % (ref 3–12)
Neutrophils Relative %: 50 % (ref 43–77)
Platelets: 276 10*3/uL (ref 150–400)
RBC: 4.29 MIL/uL (ref 3.87–5.11)
WBC: 6.7 10*3/uL (ref 4.0–10.5)

## 2012-09-04 LAB — URINALYSIS, ROUTINE W REFLEX MICROSCOPIC
Glucose, UA: NEGATIVE mg/dL
Hgb urine dipstick: NEGATIVE
Leukocytes, UA: NEGATIVE
Specific Gravity, Urine: 1.02 (ref 1.005–1.030)
pH: 6 (ref 5.0–8.0)

## 2012-09-04 LAB — COMPREHENSIVE METABOLIC PANEL
ALT: 25 U/L (ref 0–35)
AST: 25 U/L (ref 0–37)
Calcium: 9 mg/dL (ref 8.4–10.5)
Creatinine, Ser: 0.96 mg/dL (ref 0.50–1.10)
GFR calc Af Amer: >90 mL/min (ref >90)
Glucose, Bld: 105 mg/dL — ABNORMAL HIGH (ref 70–99)
Sodium: 134 mEq/L — ABNORMAL LOW (ref 135–145)
Total Protein: 6.8 g/dL (ref 6.0–8.3)

## 2012-09-04 LAB — PREGNANCY, URINE: Preg Test, Ur: NEGATIVE

## 2012-09-04 MED ORDER — SODIUM CHLORIDE 0.9 % IV BOLUS (SEPSIS): 500.0000 mL | Freq: Once | INTRAVENOUS | Status: DC

## 2012-09-04 MED ORDER — SODIUM CHLORIDE 0.9 % IV SOLN: INTRAVENOUS | Status: DC

## 2012-09-04 MED ORDER — ONDANSETRON HCL 4 MG/2ML IJ SOLN
4.0000 mg | Freq: Once | INTRAMUSCULAR | Status: DC
  Filled 2012-09-04: qty 2

## 2012-09-04 MED ORDER — HYDROMORPHONE HCL PF 1 MG/ML IJ SOLN
1.0000 mg | Freq: Once | INTRAMUSCULAR | Status: DC
  Filled 2012-09-04: qty 1

## 2012-09-04 MED ORDER — PANTOPRAZOLE SODIUM 40 MG IV SOLR
40.0000 mg | Freq: Once | INTRAVENOUS | Status: DC
  Filled 2012-09-04: qty 40

## 2012-09-04 NOTE — ED Notes (Signed)
Patient states she does not need anything at this time. 

## 2012-09-04 NOTE — ED Notes (Signed)
EDP back in with pt. Pt is leaving AMA

## 2012-09-04 NOTE — ED Notes (Signed)
In to start IV pt states she does not like needles and refused her IV. EDP aware

## 2012-09-04 NOTE — ED Notes (Signed)
Complain of epigastric pain and burning.

## 2012-09-04 NOTE — ED Provider Notes (Signed)
History    This chart was scribed for Shelda Jakes, MD, by Yevette Edwards, ED Scribe. This patient was seen in room APA03/APA03 and the patient's care was started at 7:27 AM.  CSN: [id number] Arrival date & time 09/04/12  7829  First MD Initiated Contact with Patient 09/04/12 0715     Chief Complaint  Patient presents with  . Abdominal Pain    Patient is a 27 y.o. female presenting with abdominal pain. The history is provided by the patient. No language interpreter was used.  Abdominal Pain This is a new problem. The current episode started 12 to 24 hours ago. The problem occurs constantly. The problem has been gradually worsening. Associated symptoms include chest pain (Epigastric pain radiates to her chest. ), abdominal pain and shortness of breath. Nothing aggravates the symptoms. Nothing relieves the symptoms. She has tried rest for the symptoms. The treatment provided no relief.   HPI Comments: Kristin Simmons is a 27 y.o. female who presents to the Emergency Department complaining of constant, gradually increasening epigastric pain which began 7:30 pm yesterday evening. The pt describes the pain as "burning," and she ranks her pain as 10/10 at its worse and 6/10 currently. She reports that the pain increases when she is attempting to sleep.  The pt states that the pain radiates to her chest, but not to her back, and that it causes her mild SOB. She denies experiencing any nausea, emesis, or diarrhea as associated symptoms. Nothing worsens or lessens the pain. She has a h/o of HTN. The pt is a daily smoker, and she uses alcohol.   Past Medical History  Diagnosis Date  . Hypertension    History reviewed. No pertinent past surgical history. No family history on file. History  Substance Use Topics  . Smoking status: Current Every Day Smoker -- 1.00 packs/day  . Smokeless tobacco: Not on file  . Alcohol Use: 10.2 oz/week    17 Shots of liquor per week   No OB history  provided.  Review of Systems  Constitutional: Negative for fever and chills.  HENT: Negative for sore throat and neck pain.   Eyes: Negative for visual disturbance.  Respiratory: Positive for shortness of breath. Negative for cough.   Cardiovascular: Positive for chest pain (Epigastric pain radiates to her chest. ). Negative for leg swelling.  Gastrointestinal: Positive for abdominal pain. Negative for nausea, vomiting and diarrhea.  Genitourinary: Negative for dysuria.  Musculoskeletal: Negative for back pain.  Skin: Negative for rash.  Hematological: Does not bruise/bleed easily.  All other systems reviewed and are negative.    Allergies  Latex  Home Medications   Current Outpatient Rx  Name  Route  Sig  Dispense  Refill  . albuterol (PROVENTIL HFA;VENTOLIN HFA) 108 (90 BASE) MCG/ACT inhaler   Inhalation   Inhale 2 puffs into the lungs every 6 (six) hours as needed. For asthma          . Etonogestrel (IMPLANON) 68 MG IMPL   Subcutaneous   Inject 1 each into the skin once.           . penicillin v potassium (VEETID) 500 MG tablet      2 po bid with food   28 tablet   0   . traMADol (ULTRAM) 50 MG tablet      1 po q6h prn pain   15 tablet   0    Triage Vitals: BP 138/7  Pulse 77  Temp(Src) 98 F (  36.7 C) (Oral)  Resp 20  Ht 5\' 3"  (1.6 m)  Wt 124 lb (56.246 kg)  BMI 21.97 kg/m2  SpO2 99%  LMP 09/01/2012  Physical Exam  Nursing note and vitals reviewed. Constitutional: She is oriented to person, place, and time. She appears well-developed and well-nourished. No distress.  HENT:  Head: Normocephalic and atraumatic.  Mouth/Throat: Oropharynx is clear and moist. No oropharyngeal exudate.  Eyes: Conjunctivae and EOM are normal. Pupils are equal, round, and reactive to light. Right eye exhibits no discharge. Left eye exhibits no discharge. No scleral icterus.  Neck: Normal range of motion. Neck supple. No JVD present. No tracheal deviation present. No  thyromegaly present.  Cardiovascular: Normal rate, regular rhythm, normal heart sounds and intact distal pulses.  Exam reveals no gallop and no friction rub.   No murmur heard. Pulmonary/Chest: Effort normal and breath sounds normal. No respiratory distress. She has no wheezes. She has no rales.  Abdominal: Soft. Bowel sounds are normal. She exhibits no distension and no mass. There is no tenderness.  Musculoskeletal: Normal range of motion. She exhibits no edema and no tenderness.  Lymphadenopathy:    She has no cervical adenopathy.  Neurological: She is alert and oriented to person, place, and time. Coordination normal.  Skin: Skin is warm and dry. No rash noted. No erythema.  Psychiatric: She has a normal mood and affect. Her behavior is normal.    ED Course  Procedures (including critical care time)  DIAGNOSTIC STUDIES: Oxygen Saturation is 99% on room air, normal by my interpretation.    COORDINATION OF CARE:  7:31 AM-Discussed treatment plan with patient which includes a CT scan, IV fluids, and pain medication, and the patient agreed to the plan.   Labs Reviewed  COMPREHENSIVE METABOLIC PANEL - Abnormal; Notable for the following:    Sodium 134 (*)    Glucose, Bld 105 (*)    BUN 5 (*)    Albumin 3.3 (*)    GFR calc non Af Amer 82 (*)    All other components within normal limits  CBC WITH DIFFERENTIAL - Abnormal; Notable for the following:    Eosinophils Relative 9 (*)    All other components within normal limits  URINALYSIS, ROUTINE W REFLEX MICROSCOPIC - Abnormal; Notable for the following:    Ketones, ur TRACE (*)    All other components within normal limits  LIPASE, BLOOD  PREGNANCY, URINE    Results for orders placed during the hospital encounter of 09/04/12  COMPREHENSIVE METABOLIC PANEL      Result Value Range   Sodium 134 (*) 135 - 145 mEq/L   Potassium 4.4  3.5 - 5.1 mEq/L   Chloride 100  96 - 112 mEq/L   CO2 27  19 - 32 mEq/L   Glucose, Bld 105 (*) 70 -  99 mg/dL   BUN 5 (*) 6 - 23 mg/dL   Creatinine, Ser 0.86  0.50 - 1.10 mg/dL   Calcium 9.0  8.4 - 57.8 mg/dL   Total Protein 6.8  6.0 - 8.3 g/dL   Albumin 3.3 (*) 3.5 - 5.2 g/dL   AST 25  0 - 37 U/L   ALT 25  0 - 35 U/L   Alkaline Phosphatase 66  39 - 117 U/L   Total Bilirubin 0.5  0.3 - 1.2 mg/dL   GFR calc non Af Amer 82 (*) >90 mL/min   GFR calc Af Amer >90  >90 mL/min  LIPASE, BLOOD  Result Value Range   Lipase 23  11 - 59 U/L  CBC WITH DIFFERENTIAL      Result Value Range   WBC 6.7  4.0 - 10.5 K/uL   RBC 4.29  3.87 - 5.11 MIL/uL   Hemoglobin 13.5  12.0 - 15.0 g/dL   HCT 16.1  09.6 - 04.5 %   MCV 92.8  78.0 - 100.0 fL   MCH 31.5  26.0 - 34.0 pg   MCHC 33.9  30.0 - 36.0 g/dL   RDW 40.9  81.1 - 91.4 %   Platelets 276  150 - 400 K/uL   Neutrophils Relative % 50  43 - 77 %   Neutro Abs 3.4  1.7 - 7.7 K/uL   Lymphocytes Relative 34  12 - 46 %   Lymphs Abs 2.3  0.7 - 4.0 K/uL   Monocytes Relative 6  3 - 12 %   Monocytes Absolute 0.4  0.1 - 1.0 K/uL   Eosinophils Relative 9 (*) 0 - 5 %   Eosinophils Absolute 0.6  0.0 - 0.7 K/uL   Basophils Relative 0  0 - 1 %   Basophils Absolute 0.0  0.0 - 0.1 K/uL  URINALYSIS, ROUTINE W REFLEX MICROSCOPIC      Result Value Range   Color, Urine YELLOW  YELLOW   APPearance CLEAR  CLEAR   Specific Gravity, Urine 1.020  1.005 - 1.030   pH 6.0  5.0 - 8.0   Glucose, UA NEGATIVE  NEGATIVE mg/dL   Hgb urine dipstick NEGATIVE  NEGATIVE   Bilirubin Urine NEGATIVE  NEGATIVE   Ketones, ur TRACE (*) NEGATIVE mg/dL   Protein, ur NEGATIVE  NEGATIVE mg/dL   Urobilinogen, UA 0.2  0.0 - 1.0 mg/dL   Nitrite NEGATIVE  NEGATIVE   Leukocytes, UA NEGATIVE  NEGATIVE  PREGNANCY, URINE      Result Value Range   Preg Test, Ur NEGATIVE  NEGATIVE      No results found. 1. Abdominal pain, acute     MDM  Patient refusing workup of any type. Offered ultrasound or CT scan for duct pain medicine patient does not want anything done once to go home in  situ be okay. Patient will be signing out AMA. Certain risk are involved patient was warned of those risks. [name] lab workup so far negative.  I personally performed the services described in this documentation, which was scribed in my presence. The recorded information has been reviewed and is accurate.     Shelda Jakes, MD 09/04/12 [phone]

## 2012-09-04 NOTE — ED Notes (Signed)
Pt agreeded to have IV and CT scan. When nurse attempted to start IV pt jerked her arm back and said she just could not do it. Also, states she has to be out of here by 11 AM because she has a class. EDP aware and will talk to pt again

## 2014-04-12 ENCOUNTER — Emergency Department (HOSPITAL_COMMUNITY): Payer: Medicaid Other

## 2014-04-12 ENCOUNTER — Emergency Department (HOSPITAL_COMMUNITY)
Admission: EM | Admit: 2014-04-12 | Discharge: 2014-04-12 | Disposition: A | Discharge status: Home / Self Care | Payer: Medicaid Other | Attending: Emergency Medicine | Admitting: Emergency Medicine

## 2014-04-12 ENCOUNTER — Encounter (HOSPITAL_COMMUNITY): Payer: Self-pay | Admitting: *Deleted

## 2014-04-12 DIAGNOSIS — R0602 Shortness of breath: Secondary | ICD-10-CM | POA: Insufficient documentation

## 2014-04-12 DIAGNOSIS — Z8659 Personal history of other mental and behavioral disorders: Secondary | ICD-10-CM | POA: Diagnosis not present

## 2014-04-12 DIAGNOSIS — I10 Essential (primary) hypertension: Secondary | ICD-10-CM | POA: Insufficient documentation

## 2014-04-12 DIAGNOSIS — R0789 Other chest pain: Secondary | ICD-10-CM | POA: Diagnosis not present

## 2014-04-12 DIAGNOSIS — R079 Chest pain, unspecified: Secondary | ICD-10-CM | POA: Diagnosis present

## 2014-04-12 DIAGNOSIS — Z720 Tobacco use: Secondary | ICD-10-CM | POA: Diagnosis not present

## 2014-04-12 DIAGNOSIS — Z91040 Latex allergy status: Secondary | ICD-10-CM | POA: Diagnosis not present

## 2014-04-12 HISTORY — DX: Depression, unspecified: F32.A

## 2014-04-12 HISTORY — DX: Major depressive disorder, single episode, unspecified: F32.9

## 2014-04-12 HISTORY — DX: Anxiety disorder, unspecified: F41.9

## 2014-04-12 LAB — COMPREHENSIVE METABOLIC PANEL
ALK PHOS: 57 U/L (ref 39–117)
ALT: 25 U/L (ref 0–35)
ANION GAP: 2 — AB (ref 5–15)
AST: 23 U/L (ref 0–37)
Albumin: 4.2 g/dL (ref 3.5–5.2)
BILIRUBIN TOTAL: 0.7 mg/dL (ref 0.3–1.2)
BUN: 11 mg/dL (ref 6–23)
CHLORIDE: 111 mmol/L (ref 96–112)
CO2: 21 mmol/L (ref 19–32)
Calcium: 8.9 mg/dL (ref 8.4–10.5)
Creatinine, Ser: 1.02 mg/dL (ref 0.50–1.10)
GFR calc Af Amer: 87 mL/min — ABNORMAL LOW (ref >90)
GFR, EST NON AFRICAN AMERICAN: 75 mL/min — AB (ref >90)
GLUCOSE: 122 mg/dL — AB (ref 70–99)
POTASSIUM: 3.5 mmol/L (ref 3.5–5.1)
Sodium: 134 mmol/L — ABNORMAL LOW (ref 135–145)
TOTAL PROTEIN: 7.5 g/dL (ref 6.0–8.3)

## 2014-04-12 LAB — CBC WITH DIFFERENTIAL/PLATELET
BASOS ABS: 0 10*3/uL (ref 0.0–0.1)
Basophils Relative: 1 % (ref 0–1)
EOS ABS: 0.1 10*3/uL (ref 0.0–0.7)
EOS PCT: 2 % (ref 0–5)
HEMATOCRIT: 37.4 % (ref 36.0–46.0)
HEMOGLOBIN: 12.8 g/dL (ref 12.0–15.0)
LYMPHS ABS: 2.2 10*3/uL (ref 0.7–4.0)
Lymphocytes Relative: 35 % (ref 12–46)
MCH: 30 pg (ref 26.0–34.0)
MCHC: 34.2 g/dL (ref 30.0–36.0)
MCV: 87.6 fL (ref 78.0–100.0)
MONO ABS: 0.3 10*3/uL (ref 0.1–1.0)
Monocytes Relative: 4 % (ref 3–12)
NEUTROS ABS: 3.6 10*3/uL (ref 1.7–7.7)
NEUTROS PCT: 58 % (ref 43–77)
PLATELETS: 347 10*3/uL (ref 150–400)
RBC: 4.27 MIL/uL (ref 3.87–5.11)
RDW: 14.4 % (ref 11.5–15.5)
WBC: 6.2 10*3/uL (ref 4.0–10.5)

## 2014-04-12 LAB — SEDIMENTATION RATE: SED RATE: 2 mm/h (ref 0–22)

## 2014-04-12 LAB — D-DIMER, QUANTITATIVE (NOT AT ARMC)

## 2014-04-12 LAB — TROPONIN I: Troponin I: <0.03 ng/mL (ref <0.031)

## 2014-04-12 MED ORDER — NAPROXEN 250 MG PO TABS: ORAL_TABLET | ORAL | Status: DC

## 2014-04-12 MED ORDER — KETOROLAC TROMETHAMINE 30 MG/ML IJ SOLN
30.0000 mg | Freq: Once | INTRAMUSCULAR | Status: AC
  Administered 2014-04-12: 30 mg via INTRAVENOUS
  Filled 2014-04-12: qty 1

## 2014-04-12 NOTE — ED Notes (Addendum)
Pt reporting chest pain that started today.  Pt reporting that she's been weak since yesterday.  Pt reporting increased stress.  Reports breathing is easier if she leans forward and to the right.

## 2014-04-12 NOTE — ED Notes (Signed)
Discharge instructions given, pt demonstrated teach back and verbal understanding. No concerns voiced.  

## 2014-04-12 NOTE — ED Provider Notes (Signed)
CSN: [id number]     Arrival date & time 04/12/14  0035 History   This chart was scribed for Janice Norrie, MD by Chester Holstein, ED Scribe. This patient was seen in room APA08/APA08 and the patient's care was started at 1:02 AM.    Chief Complaint  Patient presents with  . Chest Pain      Patient is a 29 y.o. female presenting with chest pain. The history is provided by the patient. No language interpreter was used.  Chest Pain Associated symptoms: shortness of breath   Associated symptoms: no fever, no nausea and not vomiting    HPI Comments: Kristin Simmons is a 30 y.o. female with PMHx of HTN who presents to the Emergency Department complaining of constant left sided chest pressure with onset today.  Pt notes associated SOB with the pain, headache, and  Lightheadedness that started yesterday. Pt notes lying flat aggravates the pain as does deep breathing. She also did notes a pleuritic component to the pain., and lying on side or sitting up and leaning to right side alleviates the pain. Pt denies similar pain in past. Pt is a smoker 1 pack per week.  Pt notes occasional EtOH use. 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 She sleeps one hour between her shifts.  Pt also has 82 month old at home that the father takes care of while she is at work. Pt states she has not been getting enough sleep. Pt denies nausea, vomiting, fever, and swelling in legs.   Family history is unknown for coronary artery disease.  PCP [facility]   Past Medical History  Diagnosis Date  . Hypertension   . Depression   . Anxiety    History reviewed. No pertinent past surgical history. History reviewed. No pertinent family history. History  Substance Use Topics  . Smoking status: Current Every Day Smoker -- 1.00 packs/day  . Smokeless tobacco: Not on file  . Alcohol Use: 0.6 oz/week    1 Cans of beer per week      Comment: daily   Employed Smokes 1 ppweek  OB History    No data available     Review of Systems  Constitutional: Negative for fever.  Respiratory: Positive for shortness of breath.   Cardiovascular: Positive for chest pain. Negative for leg swelling.  Gastrointestinal: Negative for nausea and vomiting.  All other systems reviewed and are negative.     Allergies  Latex  Home Medications   None, no BCP's BP 128/89 mmHg  Pulse 105  Temp(Src) 98.2 F (36.8 C) (Oral)  Resp 18  Ht 5\' 6"  (1.676 m)  Wt 98 lb (44.453 kg)  BMI 15.83 kg/m2  LMP 11/22/2013  Vital signs normal except for tachycardia  Physical Exam  Constitutional: She is oriented to person, place, and time. She appears well-developed and well-nourished.  Non-toxic appearance. She does not appear ill. No distress.  HENT:  Head: Normocephalic and atraumatic.  Right Ear: External ear normal.  Left Ear: External ear normal.  Nose: Nose normal. No mucosal edema or rhinorrhea.  Mouth/Throat: Oropharynx is clear and moist and mucous membranes are normal. No dental abscesses or uvula swelling.  Eyes: Conjunctivae and EOM are normal. Pupils are equal, round, and reactive to light.  Neck: Normal range of motion and full passive range of motion without pain. Neck supple.  Cardiovascular: Normal rate, regular rhythm  and normal heart sounds.  Exam reveals no gallop and no friction rub.   No murmur heard. Pulmonary/Chest: Effort normal and breath sounds normal. No respiratory distress. She has no wheezes. She has no rhonchi. She has no rales. She exhibits tenderness. She exhibits no crepitus.    Abdominal: Soft. Normal appearance and bowel sounds are normal. She exhibits no distension. There is no tenderness. There is no rebound and no guarding.  Musculoskeletal: Normal range of motion. She exhibits no edema or tenderness.  Moves all extremities well.   Neurological: She is alert and oriented to person, place, and  time. She has normal strength. No cranial nerve deficit.  Skin: Skin is warm, dry and intact. No rash noted. No erythema. No pallor.  Psychiatric: She has a normal mood and affect. Her speech is normal and behavior is normal. Her mood appears not anxious.  Nursing note and vitals reviewed.   ED Course  Procedures (including critical care time)  Medications  ketorolac (TORADOL) 30 MG/ML injection 30 mg (30 mg Intravenous Given 04/12/14 0159)    DIAGNOSTIC STUDIES:  COORDINATION OF CARE: 1:09 AM Discussed treatment plan with patient at beside, the patient agrees with the plan and has no further questions at this time.  Recheck it 2 AM patient still has pain.  Recheck 03:15 patient is sleeping. She states her pain is improved. She is now able to lay flat and even on her left side with minimal discomfort.   Labs Review Results for orders placed or performed during the hospital encounter of 04/12/14  Comprehensive metabolic panel  Result Value Ref Range   Sodium 134 (L) 135 - 145 mmol/L   Potassium 3.5 3.5 - 5.1 mmol/L   Chloride 111 96 - 112 mmol/L   CO2 21 19 - 32 mmol/L   Glucose, Bld 122 (H) 70 - 99 mg/dL   BUN 11 6 - 23 mg/dL   Creatinine, Ser 1.02 0.50 - 1.10 mg/dL   Calcium 8.9 8.4 - 10.5 mg/dL   Total Protein 7.5 6.0 - 8.3 g/dL   Albumin 4.2 3.5 - 5.2 g/dL   AST 23 0 - 37 U/L   ALT 25 0 - 35 U/L   Alkaline Phosphatase 57 39 - 117 U/L   Total Bilirubin 0.7 0.3 - 1.2 mg/dL   GFR calc non Af Amer 75 (L) >90 mL/min   GFR calc Af Amer 87 (L) >90 mL/min   Anion gap 2 (L) 5 - 15  CBC with Differential  Result Value Ref Range   WBC 6.2 4.0 - 10.5 K/uL   RBC 4.27 3.87 - 5.11 MIL/uL   Hemoglobin 12.8 12.0 - 15.0 g/dL   HCT 37.4 36.0 - 46.0 %   MCV 87.6 78.0 - 100.0 fL   MCH 30.0 26.0 - 34.0 pg   MCHC 34.2 30.0 - 36.0 g/dL   RDW 14.4 11.5 - 15.5 %   Platelets 347 150 - 400 K/uL   Neutrophils Relative % 58 43 - 77 %   Neutro Abs 3.6 1.7 - 7.7 K/uL   Lymphocytes Relative  35 12 - 46 %   Lymphs Abs 2.2 0.7 - 4.0 K/uL   Monocytes Relative 4 3 - 12 %   Monocytes Absolute 0.3 0.1 - 1.0 K/uL   Eosinophils Relative 2 0 - 5 %   Eosinophils Absolute 0.1 0.0 - 0.7 K/uL   Basophils Relative 1 0 - 1 %   Basophils Absolute 0.0 0.0 - 0.1 K/uL  Troponin  I  Result Value Ref Range   Troponin I <0.03 <0.031 ng/mL  Sedimentation rate  Result Value Ref Range   Sed Rate 2 0 - 22 mm/hr   Laboratory interpretation all normal     Imaging Review Dg Chest 2 View  04/12/2014   CLINICAL DATA:  Left-sided chest pain radiating to the left shoulder. Shortness of breath. Nonproductive cough starting today. Weakness and lightheadedness for 2 days. History of hypertension.  EXAM: CHEST  2 VIEW  COMPARISON:  09/08/2010  FINDINGS: Hyperinflation. Mild pectus excavatum. The heart size and mediastinal contours are within normal limits. Both lungs are clear. The visualized skeletal structures are unremarkable.  IMPRESSION: No active cardiopulmonary disease.   Electronically Signed   By: Lucienne Capers M.D.   On: 04/12/2014 02:39     EKG Interpretation   Date/Time:  Saturday April 12 2014 00:54:22 EST Ventricular Rate:  96 PR Interval:  126 QRS Duration: 88 QT Interval:  352 QTC Calculation: 445 R Axis:   75 Text Interpretation:  Sinus rhythm RSR' in V1 or V2, probably normal  variant ST elevation suggests acute pericarditis No significant change  since last tracing 23 Dec 2007 Confirmed by [facility]  MD-I,  ([id number]) on  04/12/2014 1:13:32 AM      MDM  although patient's EKG is read as possible acute pericarditis it is unchanged from a prior EKG. her pain was compatible with diagnosis of pericarditis in that it is very positional. She has no pericardial friction rub. [name] pain did improve with anti-inflammatories. Patient has started a new job in the last 2 weeks and she is working 2 shifts every day. She also has a 4 month at home and is getting minimal sleep. Her pain is  felt to be musculoskeletal.    Final diagnoses:  Chest wall pain    New Prescriptions   NAPROXEN (NAPROSYN) 250 MG TABLET    Take 1 po BID with food prn pain    Plan discharge   I personally performed the services described in this documentation, which was scribed in my presence. The recorded information has been reviewed and considered.  Rolland Porter, MD, FACEP     Janice Norrie, MD 04/12/14 [phone]

## 2014-04-12 NOTE — Discharge Instructions (Signed)
Use ice and heat on your chest wall for comfort. Take the naproxen for your chest wall pain. TRY TO GET MORE REST!!! Recheck if you get worse again such as fever, cough, struggling to breathe.  Chest Wall Pain Chest wall pain is pain felt in or around the chest bones and muscles. It may take up to 6 weeks to get better. It may take longer if you are active. Chest wall pain can happen on its own. Other times, things like germs, injury, coughing, or exercise can cause the pain. HOME CARE   Avoid activities that make you tired or cause pain. Try not to use your chest, belly (abdominal), or side muscles. Do not use heavy weights.  Put ice on the sore area.  Put ice in a plastic bag.  Place a towel between your skin and the bag.  Leave the ice on for 15-20 minutes for the first 2 days.  Only take medicine as told by your doctor. GET HELP RIGHT AWAY IF:   You have more pain or are very uncomfortable.  You have a fever.  Your chest pain gets worse.  You have new problems.  You feel sick to your stomach (nauseous) or throw up (vomit).  You start to sweat or feel lightheaded.  You have a cough with mucus (phlegm).  You cough up blood. MAKE SURE YOU:   Understand these instructions.  Will watch your condition.  Will get help right away if you are not doing well or get worse. Document Released: 07/20/2007 Document Revised: 04/25/2011 Document Reviewed: 09/27/2010 [facility] Patient Information 2015 [place], [place]. This information is not intended to replace advice given to you by your health care provider. Make sure you discuss any questions you have with your health care provider.

## 2014-08-13 ENCOUNTER — Emergency Department (HOSPITAL_COMMUNITY)
Admission: EM | Admit: 2014-08-13 | Discharge: 2014-08-14 | Payer: Medicaid Other | Attending: Emergency Medicine | Admitting: Emergency Medicine

## 2014-08-13 ENCOUNTER — Encounter (HOSPITAL_COMMUNITY): Payer: Self-pay | Admitting: Emergency Medicine

## 2014-08-13 ENCOUNTER — Emergency Department (HOSPITAL_COMMUNITY): Payer: Medicaid Other

## 2014-08-13 DIAGNOSIS — F17210 Nicotine dependence, cigarettes, uncomplicated: Secondary | ICD-10-CM | POA: Insufficient documentation

## 2014-08-13 DIAGNOSIS — Z8541 Personal history of malignant neoplasm of cervix uteri: Secondary | ICD-10-CM | POA: Insufficient documentation

## 2014-08-13 DIAGNOSIS — N730 Acute parametritis and pelvic cellulitis: Secondary | ICD-10-CM

## 2014-08-13 DIAGNOSIS — R110 Nausea: Secondary | ICD-10-CM | POA: Insufficient documentation

## 2014-08-13 DIAGNOSIS — N390 Urinary tract infection, site not specified: Secondary | ICD-10-CM

## 2014-08-13 DIAGNOSIS — R102 Pelvic and perineal pain: Secondary | ICD-10-CM

## 2014-08-13 DIAGNOSIS — R109 Unspecified abdominal pain: Secondary | ICD-10-CM | POA: Diagnosis present

## 2014-08-13 DIAGNOSIS — D259 Leiomyoma of uterus, unspecified: Secondary | ICD-10-CM

## 2014-08-13 HISTORY — DX: Malignant (primary) neoplasm, unspecified: C80.1

## 2014-08-13 LAB — URINALYSIS, ROUTINE W REFLEX MICROSCOPIC
Bilirubin Urine: NEGATIVE
GLUCOSE, UA: NEGATIVE mg/dL
HGB URINE DIPSTICK: NEGATIVE
KETONES UR: NEGATIVE mg/dL
Nitrite: NEGATIVE
Specific Gravity, Urine: 1.025 (ref 1.005–1.030)
UROBILINOGEN UA: 1 mg/dL (ref 0.0–1.0)
pH: 6 (ref 5.0–8.0)

## 2014-08-13 LAB — CBC WITH DIFFERENTIAL/PLATELET
BASOS ABS: 0 10*3/uL (ref 0.0–0.1)
BASOS PCT: 0 % (ref 0–1)
EOS ABS: 0.1 10*3/uL (ref 0.0–0.7)
EOS PCT: 1 % (ref 0–5)
HEMATOCRIT: 42 % (ref 36.0–46.0)
HEMOGLOBIN: 14.1 g/dL (ref 12.0–15.0)
LYMPHS ABS: 1.7 10*3/uL (ref 0.7–4.0)
Lymphocytes Relative: 14 % (ref 12–46)
MCH: 30.7 pg (ref 26.0–34.0)
MCHC: 33.6 g/dL (ref 30.0–36.0)
MCV: 91.3 fL (ref 78.0–100.0)
MONO ABS: 0.5 10*3/uL (ref 0.1–1.0)
MONOS PCT: 4 % (ref 3–12)
NEUTROS ABS: 9.8 10*3/uL — AB (ref 1.7–7.7)
NEUTROS PCT: 81 % — AB (ref 43–77)
Platelets: 315 10*3/uL (ref 150–400)
RBC: 4.6 MIL/uL (ref 3.87–5.11)
RDW: 13.4 % (ref 11.5–15.5)
WBC: 12.2 10*3/uL — ABNORMAL HIGH (ref 4.0–10.5)

## 2014-08-13 LAB — BASIC METABOLIC PANEL
ANION GAP: 9 (ref 5–15)
BUN: 9 mg/dL (ref 6–20)
CALCIUM: 9.2 mg/dL (ref 8.9–10.3)
CHLORIDE: 102 mmol/L (ref 101–111)
CO2: 26 mmol/L (ref 22–32)
CREATININE: 1.1 mg/dL — AB (ref 0.44–1.00)
Glucose, Bld: 90 mg/dL (ref 65–99)
POTASSIUM: 4 mmol/L (ref 3.5–5.1)
Sodium: 137 mmol/L (ref 135–145)

## 2014-08-13 LAB — HEPATIC FUNCTION PANEL
ALT: 27 U/L (ref 14–54)
AST: 21 U/L (ref 15–41)
Albumin: 4.3 g/dL (ref 3.5–5.0)
Alkaline Phosphatase: 51 U/L (ref 38–126)
BILIRUBIN INDIRECT: 2.1 mg/dL — AB (ref 0.3–0.9)
Bilirubin, Direct: 0.3 mg/dL (ref 0.1–0.5)
TOTAL PROTEIN: 7.8 g/dL (ref 6.5–8.1)
Total Bilirubin: 2.4 mg/dL — ABNORMAL HIGH (ref 0.3–1.2)

## 2014-08-13 LAB — PREGNANCY, URINE: Preg Test, Ur: NEGATIVE

## 2014-08-13 LAB — URINE MICROSCOPIC-ADD ON

## 2014-08-13 LAB — LIPASE, BLOOD: LIPASE: 15 U/L — AB (ref 22–51)

## 2014-08-13 MED ORDER — DEXTROSE 5 % IV SOLN
1.0000 g | Freq: Once | INTRAVENOUS | Status: AC
  Administered 2014-08-13: 1 g via INTRAVENOUS
  Filled 2014-08-13: qty 10

## 2014-08-13 MED ORDER — ONDANSETRON HCL 4 MG/2ML IJ SOLN
4.0000 mg | Freq: Once | INTRAMUSCULAR | Status: AC
  Administered 2014-08-13: 4 mg via INTRAVENOUS
  Filled 2014-08-13: qty 2

## 2014-08-13 MED ORDER — IOHEXOL 300 MG/ML  SOLN
25.0000 mL | INTRAMUSCULAR | Status: AC
  Administered 2014-08-13: 25 mL via ORAL

## 2014-08-13 MED ORDER — HYDROMORPHONE HCL 1 MG/ML IJ SOLN
1.0000 mg | Freq: Once | INTRAMUSCULAR | Status: AC
  Administered 2014-08-13: 1 mg via INTRAVENOUS
  Filled 2014-08-13: qty 1

## 2014-08-13 MED ORDER — MORPHINE SULFATE 2 MG/ML IJ SOLN
2.0000 mg | Freq: Once | INTRAMUSCULAR | Status: AC
  Administered 2014-08-13: 2 mg via INTRAVENOUS
  Filled 2014-08-13: qty 1

## 2014-08-13 MED ORDER — HYDROMORPHONE HCL 1 MG/ML IJ SOLN
INTRAMUSCULAR | Status: AC
  Administered 2014-08-13: 1 mg via INTRAVENOUS
  Filled 2014-08-13: qty 1

## 2014-08-13 MED ORDER — SODIUM CHLORIDE 0.9 % IV BOLUS (SEPSIS)
1000.0000 mL | Freq: Once | INTRAVENOUS | Status: AC
  Administered 2014-08-13: 1000 mL via INTRAVENOUS

## 2014-08-13 MED ORDER — HYDROMORPHONE HCL 1 MG/ML IJ SOLN
1.0000 mg | Freq: Once | INTRAMUSCULAR | Status: AC
Start: 2014-08-13 — End: 2014-08-13
  Administered 2014-08-13: 1 mg via INTRAVENOUS

## 2014-08-13 MED ORDER — MORPHINE SULFATE 4 MG/ML IJ SOLN
4.0000 mg | Freq: Once | INTRAMUSCULAR | Status: AC
  Administered 2014-08-13: 4 mg via INTRAVENOUS
  Filled 2014-08-13: qty 1

## 2014-08-13 NOTE — ED Notes (Addendum)
Ice given to Pt. Per Dr. Eulis Foster.

## 2014-08-13 NOTE — ED Provider Notes (Signed)
CSN: [id number]     Arrival date & time 08/13/14  1501 History   First MD Initiated Contact with Patient 08/13/14 1806     Chief Complaint  Patient presents with  . Abdominal Pain     Patient is a 29 y.o. female presenting with abdominal pain. The history is provided by the patient.  Abdominal Pain Pain location:  Generalized Pain quality: aching   Pain radiates to:  Does not radiate Pain severity:  Severe Onset quality:  Sudden Duration:  1 day Timing:  Constant Progression:  Worsening Chronicity:  New Relieved by:  Nothing Worsened by:  Movement and palpation Associated symptoms: nausea   Associated symptoms: no constipation, no diarrhea, no dysuria, no fever, no vaginal bleeding and no vomiting   Risk factors: has not had multiple surgeries   pt reports onset of abd pain yesterday She has never had this before She reports h/o cervical cancer but reports not having treatment as of yet   Past Medical History  Diagnosis Date  . Hypertension   . Depression   . Anxiety   . Cancer     cervical   History reviewed. No pertinent past surgical history. No family history on file. History  Substance Use Topics  . Smoking status: Current Every Day Smoker -- 1.00 packs/day  . Smokeless tobacco: Not on file  . Alcohol Use: 0.6 oz/week    1 Cans of beer per week     Comment: daily   OB History    No data available     Review of Systems  Constitutional: Negative for fever.  Gastrointestinal: Positive for nausea and abdominal pain. Negative for vomiting, diarrhea and constipation.  Genitourinary: Negative for dysuria and vaginal bleeding.  All other systems reviewed and are negative.     Allergies  Latex  Home Medications   Prior to Admission medications   Medication Sig Start Date End Date Taking? Authorizing Provider  ferrous sulfate 325 (65 FE) MG tablet Take 325 mg by mouth daily with breakfast.   Yes Historical Provider, MD   BP 116/87 mmHg  Pulse 94   Temp(Src) 98.3 F (36.8 C) (Oral)  Resp 18  SpO2 100%  LMP  Physical Exam CONSTITUTIONAL: uncomfortable, ill appearing HEAD: Normocephalic/atraumatic EYES: EOMI/PERRL ENMT: Mucous membranes moist NECK: supple no meningeal signs SPINE/BACK:entire spine nontender CV: S1/S2 noted, no murmurs/rubs/gallops noted LUNGS: Lungs are clear to auscultation bilaterally, no apparent distress ABDOMEN: soft, mild distention, diffuse moderate tenderness in all 4 quadrants GU:no cva tenderness NEURO: Pt is awake/alert/appropriate, moves all extremitiesx4.  No facial droop.   EXTREMITIES: pulses normal/equal, full ROM SKIN: warm, color normal PSYCH: anxious  ED Course  Procedures  Medications  ondansetron (ZOFRAN) injection 4 mg (4 mg Intravenous Given 08/13/14 1911)  morphine 4 MG/ML injection 4 mg (4 mg Intravenous Given 08/13/14 1912)    7:26 PM Will start with acute abdominal series at this time 7:57 PM Pt with significant abd tenderness Possible mass on xray Will need CT imaging Due to power outage, CT unavailable at this hospital No surgical capabilities Will need transfer to [facility] for CT imaging D/w dr Eulis Foster Accepts in transfer   She reports she is not getting treatment for cervical CA at this time  Labs Review Labs Reviewed  URINALYSIS, [place] (NOT AT [facility]) - Abnormal; Notable for the following:    Protein, ur TRACE (*)    Leukocytes, UA MODERATE (*)    All other components within normal  limits  CBC WITH DIFFERENTIAL/PLATELET - Abnormal; Notable for the following:    WBC 12.2 (*)    Neutrophils Relative % 81 (*)    Neutro Abs 9.8 (*)    All other components within normal limits  BASIC METABOLIC PANEL - Abnormal; Notable for the following:    Creatinine, Ser 1.10 (*)    All other components within normal limits  URINE MICROSCOPIC-ADD ON - Abnormal; Notable for the following:    Squamous Epithelial / LPF FEW (*)    Bacteria, UA MANY (*)    All  other components within normal limits  PREGNANCY, URINE  HEPATIC FUNCTION PANEL  LIPASE, BLOOD    Imaging Review Dg Abd Acute W/chest  08/13/2014   CLINICAL DATA:  Periumbilical abdominal pain since last night. History of cervical cancer, hypertension. Smoker. Initial encounter.  EXAM: DG ABDOMEN ACUTE W/ 1V CHEST  COMPARISON:  Chest radiographs 04/12/2014.  FINDINGS: The heart size and mediastinal contours are normal. The lungs are clear. There is no pleural effusion or pneumothorax. No acute osseous findings are identified. Prominent nipple shadows appear unchanged.  The bowel gas pattern appears nonobstructive. There is an asymmetric 10 cm oval soft tissue density in the right mid abdomen. This could be related to the liver, kidney or gallbladder, although an abdominal mass cannot be excluded based on these radiographs. Small pelvic calcifications are likely phleboliths. No acute osseous findings demonstrated. Occult spinal dysraphism noted at the lumbosacral junction.  IMPRESSION: 1. Cannot exclude right-sided abdominal mass. Further evaluation with abdominal pelvic CT recommended, especially given the patient's history of cervical cancer. 2. No evidence of bowel obstruction. 3. No active cardiopulmonary process.   Electronically Signed   By: Richardean Sale M.D.   On: 08/13/2014 19:39     EKG Interpretation None      MDM   Final diagnoses:  Abdominal pain, unspecified abdominal location    Nursing notes including past medical history and social history reviewed and considered in documentation Labs/vital reviewed myself and considered during evaluation xrays/imaging reviewed by myself and considered during evaluation    Ripley Fraise, MD 08/13/14 6144

## 2014-08-13 NOTE — ED Provider Notes (Signed)
10:00- 2 was transferred here from [facility], hospital emergency department for a CT of the abdomen. She has had abdominal pain for 1 day. She has a history of "cervical cancer", type and extent, are unknown. She has received multiple doses of narcotics, while being treated in the emergency department, and during transport. She denies nausea, vomiting, cough, shortness of breath or chest pain. She finished her last menses, 2 days ago and has begun to spot today. She takes Depo-Provera and her next injection is due in about 2 weeks.  Exam- alert, calm, cooperative. She is somewhat sedated from narcotic analgesia. Abdomen somewhat distended, diffusely tender around the periumbilical area with questionable mass in this region.  00:40- the patient is still comfortable. Pelvic examination was not done earlier and will be done at this time.  Pelvic exam-Normal external female genitalia. Vagina with moderate amount of bloody drainage, which appears thickened with discharge. On bimanual examination the cervix is very tender. There is indistinct mass in the pelvis. There is left adnexal tenderness, without palpable mass. There is no right adnexal tenderness.    CRITICAL CARE Performed by: Richarda Blade Total critical care time: 40 minutes Critical care time was exclusive of separately billable procedures and treating other patients. Critical care was necessary to treat or prevent imminent or life-threatening deterioration. Critical care was time spent personally by me on the following activities: development of treatment plan with patient and/or surrogate as well as nursing, discussions with consultants, evaluation of patient's response to treatment, examination of patient, obtaining history from patient or surrogate, ordering and performing treatments and interventions, ordering and review of laboratory studies, ordering and review of radiographic studies, pulse oximetry and re-evaluation of patient's  condition.  Assessment- pelvic pain, likely multifactorial. There is a large uterine leiomyoma. The acute pain which started within the last 24 hours is likely infectious. Therefore, believe that she has PID. Pelvic ultrasound has been ordered to evaluate for hydrosalpinx versus pyosalpinx.           Final diagnoses:  Abdominal pain, unspecified abdominal location  UTI (lower urinary tract infection)  Pelvic pain in female  PID (acute pelvic inflammatory disease)  Uterine leiomyoma, unspecified location    Medications  iohexol (OMNIPAQUE) 300 MG/ML solution 25 mL (25 mLs Oral Contrast Given 08/13/14 2159)  cefoTEtan (CEFOTAN) 2 g in dextrose 5 % 50 mL IVPB (not administered)  doxycycline (VIBRAMYCIN) 100 mg in dextrose 5 % 250 mL IVPB (not administered)  ondansetron (ZOFRAN) injection 4 mg (4 mg Intravenous Given 08/13/14 1911)  morphine 4 MG/ML injection 4 mg (4 mg Intravenous Given 08/13/14 1912)  HYDROmorphone (DILAUDID) injection 1 mg (1 mg Intravenous Given 08/13/14 1938)  sodium chloride 0.9 % bolus 1,000 mL (0 mLs Intravenous Stopped 08/13/14 2302)  morphine 2 MG/ML injection 2 mg (2 mg Intravenous Given 08/13/14 2010)  cefTRIAXone (ROCEPHIN) 1 g in dextrose 5 % 50 mL IVPB (0 g Intravenous Stopped 08/13/14 2036)  HYDROmorphone (DILAUDID) injection 1 mg (1 mg Intravenous Given 08/13/14 2203)  ondansetron (ZOFRAN) injection 4 mg (4 mg Intravenous Given 08/13/14 2204)  iohexol (OMNIPAQUE) 300 MG/ML solution 80 mL (80 mLs Intravenous Contrast Given 08/14/14 0004)    Patient Vitals for the past 24 hrs:  BP Temp Temp src Pulse Resp SpO2  08/14/14 0108 121/75 mmHg 99.5 F (37.5 C) Oral 110 18 98 %  08/13/14 2330 122/74 mmHg - - 107 - 97 %  08/13/14 2300 120/74 mmHg - - 96 - 99 %  08/13/14  2200 132/77 mmHg - - 107 - 97 %  08/13/14 2036 117/68 mmHg 98.7 F (37.1 C) Axillary 99 18 98 %  08/13/14 1943 114/65 mmHg - - 105 18 97 %  08/13/14 1658 116/87 mmHg 98.3 F (36.8 C) - 94 18 100 %   08/13/14 1509 116/67 mmHg 98.1 F (36.7 C) Oral 89 14 99 %       Results for orders placed or performed during the hospital encounter of 08/13/14  Urinalysis, Routine w reflex microscopic (not at [facility])  Result Value Ref Range   Color, Urine YELLOW YELLOW   APPearance CLEAR CLEAR   Specific Gravity, Urine 1.025 1.005 - 1.030   pH 6.0 5.0 - 8.0   Glucose, UA NEGATIVE NEGATIVE mg/dL   Hgb urine dipstick NEGATIVE NEGATIVE   Bilirubin Urine NEGATIVE NEGATIVE   Ketones, ur NEGATIVE NEGATIVE mg/dL   Protein, ur TRACE (A) NEGATIVE mg/dL   Urobilinogen, UA 1.0 0.0 - 1.0 mg/dL   Nitrite NEGATIVE NEGATIVE   Leukocytes, UA MODERATE (A) NEGATIVE  CBC with Differential  Result Value Ref Range   WBC 12.2 (H) 4.0 - 10.5 K/uL   RBC 4.60 3.87 - 5.11 MIL/uL   Hemoglobin 14.1 12.0 - 15.0 g/dL   HCT 42.0 36.0 - 46.0 %   MCV 91.3 78.0 - 100.0 fL   MCH 30.7 26.0 - 34.0 pg   MCHC 33.6 30.0 - 36.0 g/dL   RDW 13.4 11.5 - 15.5 %   Platelets 315 150 - 400 K/uL   Neutrophils Relative % 81 (H) 43 - 77 %   Neutro Abs 9.8 (H) 1.7 - 7.7 K/uL   Lymphocytes Relative 14 12 - 46 %   Lymphs Abs 1.7 0.7 - 4.0 K/uL   Monocytes Relative 4 3 - 12 %   Monocytes Absolute 0.5 0.1 - 1.0 K/uL   Eosinophils Relative 1 0 - 5 %   Eosinophils Absolute 0.1 0.0 - 0.7 K/uL   Basophils Relative 0 0 - 1 %   Basophils Absolute 0.0 0.0 - 0.1 K/uL  Basic metabolic panel  Result Value Ref Range   Sodium 137 135 - 145 mmol/L   Potassium 4.0 3.5 - 5.1 mmol/L   Chloride 102 101 - 111 mmol/L   CO2 26 22 - 32 mmol/L   Glucose, Bld 90 65 - 99 mg/dL   BUN 9 6 - 20 mg/dL   Creatinine, Ser 1.10 (H) 0.44 - 1.00 mg/dL   Calcium 9.2 8.9 - 10.3 mg/dL   GFR calc non Af Amer >60 >60 mL/min   GFR calc Af Amer >60 >60 mL/min   Anion gap 9 5 - 15  Pregnancy, urine  Result Value Ref Range   Preg Test, Ur NEGATIVE NEGATIVE  Urine microscopic-add on  Result Value Ref Range   Squamous Epithelial / LPF FEW (A) RARE   WBC, UA TOO  NUMEROUS TO COUNT <3 WBC/hpf   Bacteria, UA MANY (A) RARE  Hepatic function panel  Result Value Ref Range   Total Protein 7.8 6.5 - 8.1 g/dL   Albumin 4.3 3.5 - 5.0 g/dL   AST 21 15 - 41 U/L   ALT 27 14 - 54 U/L   Alkaline Phosphatase 51 38 - 126 U/L   Total Bilirubin 2.4 (H) 0.3 - 1.2 mg/dL   Bilirubin, Direct 0.3 0.1 - 0.5 mg/dL   Indirect Bilirubin 2.1 (H) 0.3 - 0.9 mg/dL  Lipase, blood  Result Value Ref Range   Lipase 15 (L)  22 - 51 U/L     Ct Abdomen Pelvis W Contrast  08/14/2014   CLINICAL DATA:  Severe lower abdominal pain for 1 day. History of cancer, hypertension.  EXAM: CT ABDOMEN AND PELVIS WITH CONTRAST  TECHNIQUE: Multidetector CT imaging of the abdomen and pelvis was performed using the standard protocol following bolus administration of intravenous contrast.  CONTRAST:  35mL OMNIPAQUE IOHEXOL 300 MG/ML  SOLN  COMPARISON:  Acute abdominal series March 14, 2014  FINDINGS: LUNG BASES: Included view of the lung bases are clear. Visualized heart and pericardium are unremarkable.  SOLID ORGANS: The liver demonstrates 7 mm flash filling hemangioma in RIGHT lobe of the liver, mild focal fatty infiltration about the falciform ligament, mild hepatomegaly. Spleen, gallbladder, pancreas and adrenal glands are unremarkable.  GASTROINTESTINAL TRACT: The stomach, small and large bowel are normal in course and caliber without inflammatory changes. Moderate amount of retained large bowel stool. Normal appendix.  KIDNEYS/ URINARY TRACT: Kidneys are orthotopic, demonstrating symmetric enhancement. 3 mm RIGHT upper pole nephrolithiasis. No hydronephrosis or solid renal masses. The unopacified ureters are normal in course and caliber. Urinary bladder is partially distended and unremarkable.  PERITONEUM/RETROPERITONEUM: Aortoiliac vessels are normal in course and caliber. No lymphadenopathy by CT size criteria. Enlarged, likely leiomyomatosis uterus. Tubular cystic structure in LEFT lower quadrant  measuring at least 12 a 50 mm. Small amount of free fluid in the pelvis. No intraperitoneal free air.  SOFT TISSUE/OSSEOUS STRUCTURES: Non-suspicious.  IMPRESSION: Enlarged, likely leiomyomatosis uterus. In addition, tubular cystic structure in LEFT pelvis could reflect hydrosalpinx, versus pyosalpinx and would be better characterized on pelvic sonogram.  Hepatomegaly, no abdominal mass.  3 mm nonobstructing RIGHT upper pole nephrolithiasis.   Electronically Signed   By: Elon Alas M.D.   On: 08/14/2014 00:33   Dg Abd Acute W/chest  08/13/2014   CLINICAL DATA:  Periumbilical abdominal pain since last night. History of cervical cancer, hypertension. Smoker. Initial encounter.  EXAM: DG ABDOMEN ACUTE W/ 1V CHEST  COMPARISON:  Chest radiographs 04/12/2014.  FINDINGS: The heart size and mediastinal contours are normal. The lungs are clear. There is no pleural effusion or pneumothorax. No acute osseous findings are identified. Prominent nipple shadows appear unchanged.  The bowel gas pattern appears nonobstructive. There is an asymmetric 10 cm oval soft tissue density in the right mid abdomen. This could be related to the liver, kidney or gallbladder, although an abdominal mass cannot be excluded based on these radiographs. Small pelvic calcifications are likely phleboliths. No acute osseous findings demonstrated. Occult spinal dysraphism noted at the lumbosacral junction.  IMPRESSION: 1. Cannot exclude right-sided abdominal mass. Further evaluation with abdominal pelvic CT recommended, especially given the patient's history of cervical cancer. 2. No evidence of bowel obstruction. 3. No active cardiopulmonary process.   Electronically Signed   By: Richardean Sale M.D.   On: 08/13/2014 19:39     08:81- care to Dr. Roxanne Mins to evaluate after pelvic ultrasound and consider disposition. He will likely need to contact gynecology for help with ongoing management.  Daleen Bo, MD 08/16/14 [phone]

## 2014-08-13 NOTE — ED Notes (Signed)
Pt c/o abd pain since last night.  Denies n/v/d.   

## 2014-08-13 NOTE — ED Notes (Signed)
Pt requesting something for pain and food and drink, EDP made aware

## 2014-08-14 ENCOUNTER — Emergency Department (HOSPITAL_COMMUNITY): Payer: Medicaid Other

## 2014-08-14 ENCOUNTER — Encounter (HOSPITAL_COMMUNITY): Payer: Self-pay

## 2014-08-14 LAB — GC/CHLAMYDIA PROBE AMP (~~LOC~~) NOT AT ARMC
Chlamydia: NEGATIVE
NEISSERIA GONORRHEA: POSITIVE — AB

## 2014-08-14 LAB — WET PREP, GENITAL
Clue Cells Wet Prep HPF POC: NONE SEEN
Trich, Wet Prep: NONE SEEN
Yeast Wet Prep HPF POC: NONE SEEN

## 2014-08-14 LAB — HIV ANTIBODY (ROUTINE TESTING W REFLEX): HIV SCREEN 4TH GENERATION: NONREACTIVE

## 2014-08-14 LAB — RPR: RPR Ser Ql: NONREACTIVE

## 2014-08-14 MED ORDER — CIPROFLOXACIN HCL 500 MG PO TABS: 500.0000 mg | ORAL_TABLET | Freq: Two times a day (BID) | ORAL | Status: DC

## 2014-08-14 MED ORDER — IOHEXOL 300 MG/ML  SOLN
80.0000 mL | Freq: Once | INTRAMUSCULAR | Status: AC | PRN
  Administered 2014-08-14: 80 mL via INTRAVENOUS

## 2014-08-14 MED ORDER — METOCLOPRAMIDE HCL 5 MG/ML IJ SOLN
10.0000 mg | Freq: Once | INTRAMUSCULAR | Status: AC
  Administered 2014-08-14: 10 mg via INTRAVENOUS
  Filled 2014-08-14: qty 2

## 2014-08-14 MED ORDER — DOXYCYCLINE HYCLATE 100 MG PO CAPS: 100.0000 mg | ORAL_CAPSULE | Freq: Two times a day (BID) | ORAL | Status: DC

## 2014-08-14 MED ORDER — DEXTROSE 5 % IV SOLN
100.0000 mg | Freq: Once | INTRAVENOUS | Status: DC
  Filled 2014-08-14: qty 100

## 2014-08-14 MED ORDER — OXYCODONE-ACETAMINOPHEN 5-325 MG PO TABS: 1.0000 | ORAL_TABLET | ORAL | Status: DC | PRN

## 2014-08-14 MED ORDER — DEXTROSE 5 % IV SOLN
2.0000 g | Freq: Two times a day (BID) | INTRAVENOUS | Status: DC
  Administered 2014-08-14: 2 g via INTRAVENOUS
  Filled 2014-08-14 (×2): qty 2

## 2014-08-14 MED ORDER — DOXYCYCLINE HYCLATE 100 MG PO TABS
100.0000 mg | ORAL_TABLET | Freq: Once | ORAL | Status: AC
  Administered 2014-08-14: 100 mg via ORAL
  Filled 2014-08-14: qty 1

## 2014-08-14 MED ORDER — HYDROMORPHONE HCL 1 MG/ML IJ SOLN
1.0000 mg | Freq: Once | INTRAMUSCULAR | Status: AC
  Administered 2014-08-14: 1 mg via INTRAVENOUS
  Filled 2014-08-14: qty 1

## 2014-08-14 MED ORDER — DIPHENHYDRAMINE HCL 50 MG/ML IJ SOLN
25.0000 mg | Freq: Once | INTRAMUSCULAR | Status: AC
  Administered 2014-08-14: 25 mg via INTRAVENOUS
  Filled 2014-08-14: qty 1

## 2014-08-14 MED FILL — Fentanyl Citrate Preservative Free (PF) Inj 100 MCG/2ML: INTRAMUSCULAR | Qty: 2 | Status: AC

## 2014-08-14 NOTE — ED Notes (Signed)
Patient in ultrasound.

## 2014-08-14 NOTE — ED Provider Notes (Signed)
Patient initially seen and evaluated by Dr. Eulis Foster with diagnosis of PID. She has been sent for ultrasound to evaluate for possible hydrosalpinx or pyosalpinx. Ultrasound shows no evidence of hydrosalpinx or pyosalpinx. She has required a lot of pain medication and I have discussed the case with Dr. Elonda Husky of gynecology service to see if she should be admitted for pain control. He felt that she should be managed as an outpatient if possible. He did recommend adding ciprofloxacin to her outpatient regimen. I discussed with the patient whether she would be comfortable going home with analgesia 6 with the understanding that she could return if she did not get adequate pain relief and patient was agreeable to this. She is discharged with prescriptions for doxycycline, ciprofloxacin, and oxycodone-acetaminophen.   Results for orders placed or performed during the hospital encounter of 08/13/14  Urinalysis, Routine w reflex microscopic (not at [facility])  Result Value Ref Range   Color, Urine YELLOW YELLOW   APPearance CLEAR CLEAR   Specific Gravity, Urine 1.025 1.005 - 1.030   pH 6.0 5.0 - 8.0   Glucose, UA NEGATIVE NEGATIVE mg/dL   Hgb urine dipstick NEGATIVE NEGATIVE   Bilirubin Urine NEGATIVE NEGATIVE   Ketones, ur NEGATIVE NEGATIVE mg/dL   Protein, ur TRACE (A) NEGATIVE mg/dL   Urobilinogen, UA 1.0 0.0 - 1.0 mg/dL   Nitrite NEGATIVE NEGATIVE   Leukocytes, UA MODERATE (A) NEGATIVE  CBC with Differential  Result Value Ref Range   WBC 12.2 (H) 4.0 - 10.5 K/uL   RBC 4.60 3.87 - 5.11 MIL/uL   Hemoglobin 14.1 12.0 - 15.0 g/dL   HCT 42.0 36.0 - 46.0 %   MCV 91.3 78.0 - 100.0 fL   MCH 30.7 26.0 - 34.0 pg   MCHC 33.6 30.0 - 36.0 g/dL   RDW 13.4 11.5 - 15.5 %   Platelets 315 150 - 400 K/uL   Neutrophils Relative % 81 (H) 43 - 77 %   Neutro Abs 9.8 (H) 1.7 - 7.7 K/uL   Lymphocytes Relative 14 12 - 46 %   Lymphs Abs 1.7 0.7 - 4.0 K/uL   Monocytes Relative 4 3 - 12 %   Monocytes Absolute 0.5 0.1 - 1.0  K/uL   Eosinophils Relative 1 0 - 5 %   Eosinophils Absolute 0.1 0.0 - 0.7 K/uL   Basophils Relative 0 0 - 1 %   Basophils Absolute 0.0 0.0 - 0.1 K/uL  Basic metabolic panel  Result Value Ref Range   Sodium 137 135 - 145 mmol/L   Potassium 4.0 3.5 - 5.1 mmol/L   Chloride 102 101 - 111 mmol/L   CO2 26 22 - 32 mmol/L   Glucose, Bld 90 65 - 99 mg/dL   BUN 9 6 - 20 mg/dL   Creatinine, Ser 1.10 (H) 0.44 - 1.00 mg/dL   Calcium 9.2 8.9 - 10.3 mg/dL   GFR calc non Af Amer >60 >60 mL/min   GFR calc Af Amer >60 >60 mL/min   Anion gap 9 5 - 15  Pregnancy, urine  Result Value Ref Range   Preg Test, Ur NEGATIVE NEGATIVE  Urine microscopic-add on  Result Value Ref Range   Squamous Epithelial / LPF FEW (A) RARE   WBC, UA TOO NUMEROUS TO COUNT <3 WBC/hpf   Bacteria, UA MANY (A) RARE  Hepatic function panel  Result Value Ref Range   Total Protein 7.8 6.5 - 8.1 g/dL   Albumin 4.3 3.5 - 5.0 g/dL   AST 21  15 - 41 U/L   ALT 27 14 - 54 U/L   Alkaline Phosphatase 51 38 - 126 U/L   Total Bilirubin 2.4 (H) 0.3 - 1.2 mg/dL   Bilirubin, Direct 0.3 0.1 - 0.5 mg/dL   Indirect Bilirubin 2.1 (H) 0.3 - 0.9 mg/dL  Lipase, blood  Result Value Ref Range   Lipase 15 (L) 22 - 51 U/L   US Transvaginal Non-ob  08/14/2014   CLINICAL DATA:  Pelvic pain, follow-up CT scan.  EXAM: TRANSABDOMINAL AND TRANSVAGINAL ULTRASOUND OF PELVIS  TECHNIQUE: Both transabdominal and transvaginal ultrasound examinations of the pelvis were performed. Transabdominal technique was performed for global imaging of the pelvis including uterus, ovaries, adnexal regions, and pelvic cul-de-sac. It was necessary to proceed with endovaginal exam following the transabdominal exam to visualize the adnexa and endometrium.  COMPARISON:  CT abdomen and pelvis August 14, 2014 at 0008 hours. MRI of the pelvis October 13, 2009  FINDINGS: Uterus  Measurements: 9.7 x 4.6 x 5.7 cm. No fibroids or other mass visualized. Mild heterogeneous echotexture can be  seen with adenomyosis.  Endometrium  Thickness: 4 mm. No focal abnormality visualized. Ill-defined endometrial/myometrial junction.  Right ovary  Not sonographically identified, patient was unable to tolerate further imaging.  Left ovary  Measurements: 3.6 x 1.6 x 2.5 cm. Normal appearance/no adnexal mass.  Other findings  Tubular free fluid in the LEFT lower quadrant which does not extend to the ovary.  IMPRESSION: Heterogeneous uterus with appearance of adenomyosis, no discrete leiomyoma.  Free fluid within LEFT pelvis could reflect ruptured ovarian cyst, without sonographic findings of hydrosalpinx.   Electronically Signed   By: Elon Alas M.D.   On: 08/14/2014 01:45   US Pelvis Complete  08/14/2014   CLINICAL DATA:  Pelvic pain, follow-up CT scan.  EXAM: TRANSABDOMINAL AND TRANSVAGINAL ULTRASOUND OF PELVIS  TECHNIQUE: Both transabdominal and transvaginal ultrasound examinations of the pelvis were performed. Transabdominal technique was performed for global imaging of the pelvis including uterus, ovaries, adnexal regions, and pelvic cul-de-sac. It was necessary to proceed with endovaginal exam following the transabdominal exam to visualize the adnexa and endometrium.  COMPARISON:  CT abdomen and pelvis August 14, 2014 at 0008 hours. MRI of the pelvis October 13, 2009  FINDINGS: Uterus  Measurements: 9.7 x 4.6 x 5.7 cm. No fibroids or other mass visualized. Mild heterogeneous echotexture can be seen with adenomyosis.  Endometrium  Thickness: 4 mm. No focal abnormality visualized. Ill-defined endometrial/myometrial junction.  Right ovary  Not sonographically identified, patient was unable to tolerate further imaging.  Left ovary  Measurements: 3.6 x 1.6 x 2.5 cm. Normal appearance/no adnexal mass.  Other findings  Tubular free fluid in the LEFT lower quadrant which does not extend to the ovary.  IMPRESSION: Heterogeneous uterus with appearance of adenomyosis, no discrete leiomyoma.  Free fluid within LEFT  pelvis could reflect ruptured ovarian cyst, without sonographic findings of hydrosalpinx.   Electronically Signed   By: Elon Alas M.D.   On: 08/14/2014 01:45   Ct Abdomen Pelvis W Contrast  08/14/2014   CLINICAL DATA:  Severe lower abdominal pain for 1 day. History of cancer, hypertension.  EXAM: CT ABDOMEN AND PELVIS WITH CONTRAST  TECHNIQUE: Multidetector CT imaging of the abdomen and pelvis was performed using the standard protocol following bolus administration of intravenous contrast.  CONTRAST:  38mL OMNIPAQUE IOHEXOL 300 MG/ML  SOLN  COMPARISON:  Acute abdominal series March 14, 2014  FINDINGS: LUNG BASES: Included view of the lung bases are  clear. Visualized heart and pericardium are unremarkable.  SOLID ORGANS: The liver demonstrates 7 mm flash filling hemangioma in RIGHT lobe of the liver, mild focal fatty infiltration about the falciform ligament, mild hepatomegaly. Spleen, gallbladder, pancreas and adrenal glands are unremarkable.  GASTROINTESTINAL TRACT: The stomach, small and large bowel are normal in course and caliber without inflammatory changes. Moderate amount of retained large bowel stool. Normal appendix.  KIDNEYS/ URINARY TRACT: Kidneys are orthotopic, demonstrating symmetric enhancement. 3 mm RIGHT upper pole nephrolithiasis. No hydronephrosis or solid renal masses. The unopacified ureters are normal in course and caliber. Urinary bladder is partially distended and unremarkable.  PERITONEUM/RETROPERITONEUM: Aortoiliac vessels are normal in course and caliber. No lymphadenopathy by CT size criteria. Enlarged, likely leiomyomatosis uterus. Tubular cystic structure in LEFT lower quadrant measuring at least 12 a 50 mm. Small amount of free fluid in the pelvis. No intraperitoneal free air.  SOFT TISSUE/OSSEOUS STRUCTURES: Non-suspicious.  IMPRESSION: Enlarged, likely leiomyomatosis uterus. In addition, tubular cystic structure in LEFT pelvis could reflect hydrosalpinx, versus  pyosalpinx and would be better characterized on pelvic sonogram.  Hepatomegaly, no abdominal mass.  3 mm nonobstructing RIGHT upper pole nephrolithiasis.   Electronically Signed   By: Elon Alas M.D.   On: 08/14/2014 00:33   Dg Abd Acute W/chest  08/13/2014   CLINICAL DATA:  Periumbilical abdominal pain since last night. History of cervical cancer, hypertension. Smoker. Initial encounter.  EXAM: DG ABDOMEN ACUTE W/ 1V CHEST  COMPARISON:  Chest radiographs 04/12/2014.  FINDINGS: The heart size and mediastinal contours are normal. The lungs are clear. There is no pleural effusion or pneumothorax. No acute osseous findings are identified. Prominent nipple shadows appear unchanged.  The bowel gas pattern appears nonobstructive. There is an asymmetric 10 cm oval soft tissue density in the right mid abdomen. This could be related to the liver, kidney or gallbladder, although an abdominal mass cannot be excluded based on these radiographs. Small pelvic calcifications are likely phleboliths. No acute osseous findings demonstrated. Occult spinal dysraphism noted at the lumbosacral junction.  IMPRESSION: 1. Cannot exclude right-sided abdominal mass. Further evaluation with abdominal pelvic CT recommended, especially given the patient's history of cervical cancer. 2. No evidence of bowel obstruction. 3. No active cardiopulmonary process.   Electronically Signed   By: Richardean Sale M.D.   On: 08/13/2014 32:20      Delora Fuel, MD 25/42/70 [id number]

## 2014-08-14 NOTE — Discharge Instructions (Signed)
Return if pain is not being adequately controlled, or if you start running a high fever or start vomiting.  Pelvic Inflammatory Disease Pelvic inflammatory disease (PID) refers to an infection in some or all of the female organs. The infection can be in the uterus, ovaries, fallopian tubes, or the surrounding tissues in the pelvis. PID can cause abdominal or pelvic pain that comes on suddenly (acute pelvic pain). PID is a serious infection because it can lead to lasting (chronic) pelvic pain or the inability to have children (infertile).  CAUSES  The infection is often caused by the normal bacteria found in the vaginal tissues. PID may also be caused by an infection that is spread during sexual contact. PID can also occur following:   The birth of a baby.   A miscarriage.   An abortion.   Major pelvic surgery.   The use of an intrauterine device (IUD).   A sexual assault.  RISK FACTORS Certain factors can put a person at higher risk for PID, such as:  Being younger than 25 years.  Being sexually active at [place] age.  Usingnonbarrier contraception.  Havingmultiple sexual partners.  Having sex with someone who has symptoms of a genital infection.  Using oral contraception. Other times, certain behaviors can increase the possibility of getting PID, such as:  Having sex during your period.  Using a vaginal douche.  Having an intrauterine device (IUD) in place. SYMPTOMS   Abdominal or pelvic pain.   Fever.   Chills.   Abnormal vaginal discharge.  Abnormal uterine bleeding.   Unusual pain shortly after finishing your period. DIAGNOSIS  Your caregiver will choose some of the following methods to make a diagnosis, such as:   Performinga physical exam and history. A pelvic exam typically reveals a very tender uterus and surrounding pelvis.   Ordering laboratory tests including a pregnancy test, blood tests, and urine test.  Orderingcultures of the  vagina and cervix to check for a sexually transmitted infection (STI).  Performing an ultrasound.   Performing a laparoscopic procedure to look inside the pelvis.  TREATMENT   Antibiotic medicines may be prescribed and taken by mouth.   Sexual partners may be treated when the infection is caused by a sexually transmitted disease (STD).   Hospitalization may be needed to give antibiotics intravenously.  Surgery may be needed, but this is rare. It may take weeks until you are completely well. If you are diagnosed with PID, you should also be checked for human immunodeficiency virus (HIV). HOME CARE INSTRUCTIONS   If given, take your antibiotics as directed. Finish the medicine even if you start to feel better.   Only take over-the-counter or prescription medicines for pain, discomfort, or fever as directed by your caregiver.   Do not have sexual intercourse until treatment is completed or as directed by your caregiver. If PID is confirmed, your recent sexual partner(s) will need treatment.   Keep your follow-up appointments. SEEK MEDICAL CARE IF:   You have increased or abnormal vaginal discharge.   You need prescription medicine for your pain.   You vomit.   You cannot take your medicines.   Your partner has an STD.  SEEK IMMEDIATE MEDICAL CARE IF:   You have a fever.   You have increased abdominal or pelvic pain.   You have chills.   You have pain when you urinate.   You are not better after 72 hours following treatment.  MAKE SURE YOU:   Understand these instructions.  Will watch your condition.  Will get help right away if you are not doing well or get worse. Document Released: 01/31/2005 Document Revised: 05/28/2012 Document Reviewed: 01/27/2011 [facility] Patient Information 2015 [place], [place]. This information is not intended to replace advice given to you by your health care provider. Make sure you discuss any questions you have with your  health care provider.  Ciprofloxacin tablets What is this medicine? CIPROFLOXACIN (sip roe FLOX a sin) is a quinolone antibiotic. It is used to treat certain kinds of bacterial infections. It will not work for colds, flu, or other viral infections. This medicine may be used for other purposes; ask your health care provider or pharmacist if you have questions. COMMON BRAND NAME(S): Cipro What should I tell my health care provider before I take this medicine? They need to know if you have any of these conditions: -bone problems -cerebral disease -joint problems -irregular heartbeat -kidney disease -liver disease -myasthenia gravis -seizure disorder -tendon problems -an unusual or allergic reaction to ciprofloxacin, other antibiotics or medicines, foods, dyes, or preservatives -pregnant or trying to get pregnant -breast-feeding How should I use this medicine? Take this medicine by mouth with a glass of water. Follow the directions on the prescription label. Take your medicine at regular intervals. Do not take your medicine more often than directed. Take all of your medicine as directed even if you think your are better. Do not skip doses or stop your medicine early. You can take this medicine with food or on an empty stomach. It can be taken with a meal that contains dairy or calcium, but do not take it alone with a dairy product, like milk or yogurt or calcium-fortified juice. A special MedGuide will be given to you by the pharmacist with each prescription and refill. Be sure to read this information carefully each time. Talk to your pediatrician regarding the use of this medicine in children. Special care may be needed. Overdosage: If you think you have taken too much of this medicine contact a poison control center or emergency room at once. NOTE: This medicine is only for you. Do not share this medicine with others. What if I miss a dose? If you miss a dose, take it as soon as you can.  If it is almost time for your next dose, take only that dose. Do not take double or extra doses. What may interact with this medicine? Do not take this medicine with any of the following medications: -cisapride -droperidol -terfenadine -tizanidine This medicine may also interact with the following medications: -antacids -birth control pills -caffeine -cyclosporin -didanosine (ddI) buffered tablets or powder -medicines for diabetes -medicines for inflammation like ibuprofen, naproxen -methotrexate -multivitamins -omeprazole -phenytoin -probenecid -sucralfate -theophylline -warfarin This list may not describe all possible interactions. Give your health care provider a list of all the medicines, herbs, non-prescription drugs, or dietary supplements you use. Also tell them if you smoke, drink alcohol, or use illegal drugs. Some items may interact with your medicine. What should I watch for while using this medicine? Tell your doctor or health care professional if your symptoms do not improve. Do not treat diarrhea with over the counter products. Contact your doctor if you have diarrhea that lasts more than 2 days or if it is severe and watery. You may get drowsy or dizzy. Do not drive, use machinery, or do anything that needs mental alertness until you know how this medicine affects you. Do not stand or sit up quickly, especially if you  are an older patient. This reduces the risk of dizzy or fainting spells. This medicine can make you more sensitive to the sun. Keep out of the sun. If you cannot avoid being in the sun, wear protective clothing and use sunscreen. Do not use sun lamps or tanning beds/booths. Avoid antacids, aluminum, calcium, iron, magnesium, and zinc products for 6 hours before and 2 hours after taking a dose of this medicine. What side effects may I notice from receiving this medicine? Side effects that you should report to your doctor or health care professional as soon  as possible: - allergic reactions like skin rash, itching or hives, swelling of the face, lips, or tongue - breathing problems - confusion, nightmares or hallucinations - feeling faint or lightheaded, falls - irregular heartbeat - joint, muscle or tendon pain or swelling - pain or trouble passing urine -persistent headache with or without blurred vision - redness, blistering, peeling or loosening of the skin, including inside the mouth - seizure - unusual pain, numbness, tingling, or weakness Side effects that usually do not require medical attention (report to your doctor or health care professional if they continue or are bothersome): - diarrhea - nausea or stomach upset - white patches or sores in the mouth This list may not describe all possible side effects. Call your doctor for medical advice about side effects. You may report side effects to FDA at 1-800-FDA-1088. Where should I keep my medicine? Keep out of the reach of children. Store at room temperature below 30 degrees C (86 degrees F). Keep container tightly closed. Throw away any unused medicine after the expiration date. NOTE: This sheet is a summary. It may not cover all possible information. If you have questions about this medicine, talk to your doctor, pharmacist, or health care provider.  2015, Elsevier/Gold Standard. (2012-09-06 16:10:46)  Doxycycline tablets or capsules What is this medicine? DOXYCYCLINE (dox i SYE kleen) is a tetracycline antibiotic. It kills certain bacteria or stops their growth. It is used to treat many kinds of infections, like dental, skin, respiratory, and urinary tract infections. It also treats acne, Lyme disease, malaria, and certain sexually transmitted infections. This medicine may be used for other purposes; ask your health care provider or pharmacist if you have questions. COMMON BRAND NAME(S): Acticlate, Adoxa, Adoxa CK, Adoxa Pak, Adoxa TT, Alodox, Avidoxy, Doxal, Monodox,  Morgidox 1x, Morgidox 1x Kit, Morgidox 2x, Morgidox 2x Kit, Ocudox, Vibra-Tabs, Vibramycin What should I tell my health care provider before I take this medicine? They need to know if you have any of these conditions: -liver disease -long exposure to sunlight like working outdoors -stomach problems like colitis -an unusual or allergic reaction to doxycycline, tetracycline antibiotics, other medicines, foods, dyes, or preservatives -pregnant or trying to get pregnant -breast-feeding How should I use this medicine? Take this medicine by mouth with a full glass of water. Follow the directions on the prescription label. It is best to take this medicine without food, but if it upsets your stomach take it with food. Take your medicine at regular intervals. Do not take your medicine more often than directed. Take all of your medicine as directed even if you think you are better. Do not skip doses or stop your medicine early. Talk to your pediatrician regarding the use of this medicine in children. Special care may be needed. While this drug may be prescribed for children as young as 66 years old for selected conditions, precautions do apply. Overdosage: If you think you have  taken too much of this medicine contact a poison control center or emergency room at once. NOTE: This medicine is only for you. Do not share this medicine with others. What if I miss a dose? If you miss a dose, take it as soon as you can. If it is almost time for your next dose, take only that dose. Do not take double or extra doses. What may interact with this medicine? -antacids -barbiturates -birth control pills -bismuth subsalicylate -carbamazepine -methoxyflurane -other antibiotics -phenytoin -vitamins that contain iron -warfarin This list may not describe all possible interactions. Give your health care provider a list of all the medicines, herbs, non-prescription drugs, or dietary supplements you use. Also tell them if  you smoke, drink alcohol, or use illegal drugs. Some items may interact with your medicine. What should I watch for while using this medicine? Tell your doctor or health care professional if your symptoms do not improve. Do not treat diarrhea with over the counter products. Contact your doctor if you have diarrhea that lasts more than 2 days or if it is severe and watery. Do not take this medicine just before going to bed. It may not dissolve properly when you lay down and can cause pain in your throat. Drink plenty of fluids while taking this medicine to also help reduce irritation in your throat. This medicine can make you more sensitive to the sun. Keep out of the sun. If you cannot avoid being in the sun, wear protective clothing and use sunscreen. Do not use sun lamps or tanning beds/booths. Birth control pills may not work properly while you are taking this medicine. Talk to your doctor about using an extra method of birth control. If you are being treated for a sexually transmitted infection, avoid sexual contact until you have finished your treatment. Your sexual partner may also need treatment. Avoid antacids, aluminum, calcium, magnesium, and iron products for 4 hours before and 2 hours after taking a dose of this medicine. If you are using this medicine to prevent malaria, you should still protect yourself from contact with mosquitos. Stay in screened-in areas, use mosquito nets, keep your body covered, and use an insect repellent. What side effects may I notice from receiving this medicine? Side effects that you should report to your doctor or health care professional as soon as possible: -allergic reactions like skin rash, itching or hives, swelling of the face, lips, or tongue -difficulty breathing -fever -itching in the rectal or genital area -pain on swallowing -redness, blistering, peeling or loosening of the skin, including inside the mouth -severe stomach pain or cramps -unusual  bleeding or bruising -unusually weak or tired -yellowing of the eyes or skin Side effects that usually do not require medical attention (report to your doctor or health care professional if they continue or are bothersome): -diarrhea -loss of appetite -nausea, vomiting This list may not describe all possible side effects. Call your doctor for medical advice about side effects. You may report side effects to FDA at 1-800-FDA-1088. Where should I keep my medicine? Keep out of the reach of children. Store at room temperature, below 30 degrees C (86 degrees F). Protect from light. Keep container tightly closed. Throw away any unused medicine after the expiration date. Taking this medicine after the expiration date can make you seriously ill. NOTE: This sheet is a summary. It may not cover all possible information. If you have questions about this medicine, talk to your doctor, pharmacist, or health care provider.  2015, Elsevier/Gold Standard. (2012-12-07 13:58:06)  Acetaminophen; Oxycodone tablets What is this medicine? ACETAMINOPHEN; OXYCODONE (a set a MEE noe fen; ox i KOE done) is a pain reliever. It is used to treat mild to moderate pain. This medicine may be used for other purposes; ask your health care provider or pharmacist if you have questions. COMMON BRAND NAME(S): Endocet, Magnacet, Narvox, Percocet, Perloxx, Primalev, Primlev, Roxicet, Xolox What should I tell my health care provider before I take this medicine? They need to know if you have any of these conditions: -brain tumor -Crohn's disease, inflammatory bowel disease, or ulcerative colitis -drug abuse or addiction -head injury -heart or circulation problems -if you often drink alcohol -kidney disease or problems going to the bathroom -liver disease -lung disease, asthma, or breathing problems -an unusual or allergic reaction to acetaminophen, oxycodone, other opioid analgesics, other medicines, foods, dyes, or  preservatives -pregnant or trying to get pregnant -breast-feeding How should I use this medicine? Take this medicine by mouth with a full glass of water. Follow the directions on the prescription label. Take your medicine at regular intervals. Do not take your medicine more often than directed. Talk to your pediatrician regarding the use of this medicine in children. Special care may be needed. Patients over 27 years old may have a stronger reaction and need a smaller dose. Overdosage: If you think you have taken too much of this medicine contact a poison control center or emergency room at once. NOTE: This medicine is only for you. Do not share this medicine with others. What if I miss a dose? If you miss a dose, take it as soon as you can. If it is almost time for your next dose, take only that dose. Do not take double or extra doses. What may interact with this medicine? -alcohol -antihistamines -barbiturates like amobarbital, butalbital, butabarbital, methohexital, pentobarbital, phenobarbital, thiopental, and secobarbital -benztropine -drugs for bladder problems like solifenacin, trospium, oxybutynin, tolterodine, hyoscyamine, and methscopolamine -drugs for breathing problems like ipratropium and tiotropium -drugs for certain stomach or intestine problems like propantheline, homatropine methylbromide, glycopyrrolate, atropine, belladonna, and dicyclomine -general anesthetics like etomidate, ketamine, nitrous oxide, propofol, desflurane, enflurane, halothane, isoflurane, and sevoflurane -medicines for depression, anxiety, or psychotic disturbances -medicines for sleep -muscle relaxants -naltrexone -narcotic medicines (opiates) for pain -phenothiazines like perphenazine, thioridazine, chlorpromazine, mesoridazine, fluphenazine, prochlorperazine, promazine, and trifluoperazine -scopolamine -tramadol -trihexyphenidyl This list may not describe all possible interactions. Give your health  care provider a list of all the medicines, herbs, non-prescription drugs, or dietary supplements you use. Also tell them if you smoke, drink alcohol, or use illegal drugs. Some items may interact with your medicine. What should I watch for while using this medicine? Tell your doctor or health care professional if your pain does not go away, if it gets worse, or if you have new or a different type of pain. You may develop tolerance to the medicine. Tolerance means that you will need a higher dose of the medication for pain relief. Tolerance is normal and is expected if you take this medicine for a long time. Do not suddenly stop taking your medicine because you may develop a severe reaction. Your body becomes used to the medicine. This does NOT mean you are addicted. Addiction is a behavior related to getting and using a drug for a non-medical reason. If you have pain, you have a medical reason to take pain medicine. Your doctor will tell you how much medicine to take. If your doctor wants you to  stop the medicine, the dose will be slowly lowered over time to avoid any side effects. You may get drowsy or dizzy. Do not drive, use machinery, or do anything that needs mental alertness until you know how this medicine affects you. Do not stand or sit up quickly, especially if you are an older patient. This reduces the risk of dizzy or fainting spells. Alcohol may interfere with the effect of this medicine. Avoid alcoholic drinks. There are different types of narcotic medicines (opiates) for pain. If you take more than one type at the same time, you may have more side effects. Give your health care provider a list of all medicines you use. Your doctor will tell you how much medicine to take. Do not take more medicine than directed. Call emergency for help if you have problems breathing. The medicine will cause constipation. Try to have a bowel movement at least every 2 to 3 days. If you do not have a bowel movement  for 3 days, call your doctor or health care professional. Do not take Tylenol (acetaminophen) or medicines that have acetaminophen with this medicine. Too much acetaminophen can be very dangerous. Many nonprescription medicines contain acetaminophen. Always read the labels carefully to avoid taking more acetaminophen. What side effects may I notice from receiving this medicine? Side effects that you should report to your doctor or health care professional as soon as possible: -allergic reactions like skin rash, itching or hives, swelling of the face, lips, or tongue -breathing difficulties, wheezing -confusion -light headedness or fainting spells -severe stomach pain -unusually weak or tired -yellowing of the skin or the whites of the eyes Side effects that usually do not require medical attention (report to your doctor or health care professional if they continue or are bothersome): -dizziness -drowsiness -nausea -vomiting This list may not describe all possible side effects. Call your doctor for medical advice about side effects. You may report side effects to FDA at 1-800-FDA-1088. Where should I keep my medicine? Keep out of the reach of children. This medicine can be abused. Keep your medicine in a safe place to protect it from theft. Do not share this medicine with anyone. Selling or giving away this medicine is dangerous and against the law. Store at room temperature between 20 and 25 degrees C (68 and 77 degrees F). Keep container tightly closed. Protect from light. This medicine may cause accidental overdose and death if it is taken by other adults, children, or pets. Flush any unused medicine down the toilet to reduce the chance of harm. Do not use the medicine after the expiration date. NOTE: This sheet is a summary. It may not cover all possible information. If you have questions about this medicine, talk to your doctor, pharmacist, or health care provider.  2015, [employer]vier/[employer]  Standard. (2012-09-24 13:17:35)

## 2014-08-15 ENCOUNTER — Telehealth (HOSPITAL_BASED_OUTPATIENT_CLINIC_OR_DEPARTMENT_OTHER): Payer: Self-pay | Admitting: Emergency Medicine

## 2014-08-15 NOTE — Telephone Encounter (Signed)
Positive Gonorrhea culture Chart sent to EDP for review

## 2014-08-16 ENCOUNTER — Telehealth: Payer: Self-pay | Admitting: Emergency Medicine

## 2014-08-17 ENCOUNTER — Telehealth: Payer: Self-pay | Admitting: Emergency Medicine

## 2014-08-18 ENCOUNTER — Telehealth (HOSPITAL_COMMUNITY): Payer: Self-pay

## 2014-08-18 NOTE — ED Notes (Signed)
Unable to reach by telephone. Letter sent to address on record.  

## 2014-08-22 ENCOUNTER — Telehealth (HOSPITAL_COMMUNITY): Payer: Self-pay

## 2014-08-22 NOTE — Telephone Encounter (Signed)
Pt calling for STD results after receiving letter in mail.  ID verified x 2.  Pt informed (+) gonorrhea and tx received in ED is approp. Tx., abstain from sex for 2 wks and notify partner(s) for testing and tx.

## 2015-01-30 ENCOUNTER — Emergency Department (HOSPITAL_COMMUNITY)
Admission: EM | Admit: 2015-01-30 | Discharge: 2015-01-30 | Disposition: A | Discharge status: Home Health | Payer: Medicaid Other | Attending: Emergency Medicine | Admitting: Emergency Medicine

## 2015-01-30 ENCOUNTER — Emergency Department (HOSPITAL_COMMUNITY): Payer: Medicaid Other

## 2015-01-30 ENCOUNTER — Encounter (HOSPITAL_COMMUNITY): Payer: Self-pay | Admitting: Emergency Medicine

## 2015-01-30 DIAGNOSIS — Y998 Other external cause status: Secondary | ICD-10-CM | POA: Insufficient documentation

## 2015-01-30 DIAGNOSIS — Z91040 Latex allergy status: Secondary | ICD-10-CM | POA: Insufficient documentation

## 2015-01-30 DIAGNOSIS — F17200 Nicotine dependence, unspecified, uncomplicated: Secondary | ICD-10-CM | POA: Diagnosis not present

## 2015-01-30 DIAGNOSIS — Y9389 Activity, other specified: Secondary | ICD-10-CM | POA: Insufficient documentation

## 2015-01-30 DIAGNOSIS — I10 Essential (primary) hypertension: Secondary | ICD-10-CM | POA: Diagnosis not present

## 2015-01-30 DIAGNOSIS — Z79899 Other long term (current) drug therapy: Secondary | ICD-10-CM | POA: Insufficient documentation

## 2015-01-30 DIAGNOSIS — S81011A Laceration without foreign body, right knee, initial encounter: Secondary | ICD-10-CM

## 2015-01-30 DIAGNOSIS — Y9289 Other specified places as the place of occurrence of the external cause: Secondary | ICD-10-CM | POA: Insufficient documentation

## 2015-01-30 DIAGNOSIS — R000 Tachycardia, unspecified: Secondary | ICD-10-CM | POA: Diagnosis not present

## 2015-01-30 DIAGNOSIS — Z792 Long term (current) use of antibiotics: Secondary | ICD-10-CM | POA: Diagnosis not present

## 2015-01-30 DIAGNOSIS — Z8659 Personal history of other mental and behavioral disorders: Secondary | ICD-10-CM | POA: Insufficient documentation

## 2015-01-30 DIAGNOSIS — Z8541 Personal history of malignant neoplasm of cervix uteri: Secondary | ICD-10-CM | POA: Diagnosis not present

## 2015-01-30 DIAGNOSIS — S8991XA Unspecified injury of right lower leg, initial encounter: Secondary | ICD-10-CM | POA: Diagnosis present

## 2015-01-30 MED ORDER — POVIDONE-IODINE 10 % EX SOLN
CUTANEOUS | Status: AC
  Filled 2015-01-30: qty 118

## 2015-01-30 MED ORDER — BUPIVACAINE-EPINEPHRINE (PF) 0.5% -1:200000 IJ SOLN
INTRAMUSCULAR | Status: AC
  Filled 2015-01-30: qty 30

## 2015-01-30 NOTE — Discharge Instructions (Signed)
Keep the wound clean and dry. Use triple antibiotic ointment on the wound to prevent infection. Ice packs may help with the pain.  You can take ibuprofen 600 mg + acetaminophen 650 mg 4 times a day for pain. The sutures need to be removed in 12-14 days. Recheck sooner if it gets infected. NO KNEELING or bending your knee hard like running or the stitches may pop open.    Laceration Care, Adult A laceration is a cut that goes through all layers of the skin. The cut also goes into the tissue that is right under the skin. Some cuts heal on their own. Others need to be closed with stitches (sutures), staples, skin adhesive strips, or wound glue. Taking care of your cut lowers your risk of infection and helps your cut to heal better. HOW TO TAKE CARE OF YOUR CUT For stitches or staples:  Keep the wound clean and dry.  If you were given a bandage (dressing), you should change it at least one time per day or as told by your doctor. You should also change it if it gets wet or dirty.  Keep the wound completely dry for the first 24 hours or as told by your doctor. After that time, you may take a shower or a bath. However, make sure that the wound is not soaked in water until after the stitches or staples have been removed.  Clean the wound one time each day or as told by your doctor:  Wash the wound with soap and water.  Rinse the wound with water until all of the soap comes off.  Pat the wound dry with a clean towel. Do not rub the wound.  After you clean the wound, put a thin layer of antibiotic ointment on it as told by your doctor. This ointment:  Helps to prevent infection.  Keeps the bandage from sticking to the wound.  Have your stitches or staples removed as told by your doctor.   General Instructions  To help prevent scarring, make sure to cover your wound with sunscreen whenever you are outside after stitches are removed, after adhesive strips are removed, or when wound glue stays  in place and the wound is healed. Make sure to wear a sunscreen of at least 30 SPF.  Take over-the-counter and prescription medicines only as told by your doctor.  If you were given antibiotic medicine or ointment, take or apply it as told by your doctor. Do not stop using the antibiotic even if your wound is getting better.  Do not scratch or pick at the wound.  Keep all follow-up visits as told by your doctor. This is important.  Check your wound every day for signs of infection. Watch for:  Redness, swelling, or pain.  Fluid, blood, or pus.  Raise (elevate) the injured area above the level of your heart while you are sitting or lying down, if possible. GET HELP IF:  You got a tetanus shot and you have any of these problems at the injection site:  Swelling.  Very bad pain.  Redness.  Bleeding.  You have a fever.  A wound that was closed breaks open.  You notice a bad smell coming from your wound or your bandage.  You notice something coming out of the wound, such as wood or glass.  Medicine does not help your pain.  You have more redness, swelling, or pain at the site of your wound.  You have fluid, blood, or pus coming from  your wound.  You notice a change in the color of your skin near your wound.  You need to change the bandage often because fluid, blood, or pus is coming from the wound.  You start to have a new rash.  You start to have numbness around the wound. GET HELP RIGHT AWAY IF:  You have very bad swelling around the wound.  Your pain suddenly gets worse and is very bad.  You notice painful lumps near the wound or on skin that is anywhere on your body.  You have a red streak going away from your wound.  The wound is on your hand or foot and you cannot move a finger or toe like you usually can.  The wound is on your hand or foot and you notice that your fingers or toes look pale or bluish.   This information is not intended to replace advice  given to you by your health care provider. Make sure you discuss any questions you have with your health care provider.   Document Released: 07/20/2007 Document Revised: 06/17/2014 Document Reviewed: 01/27/2014 Elsevier Interactive Patient Education [employer].
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
## 2015-07-22 ENCOUNTER — Encounter (HOSPITAL_COMMUNITY): Payer: Self-pay | Admitting: Emergency Medicine

## 2015-07-22 ENCOUNTER — Emergency Department (HOSPITAL_COMMUNITY)
Admission: EM | Admit: 2015-07-22 | Discharge: 2015-07-22 | Disposition: A | Discharge status: Home / Self Care | Payer: Medicaid Other | Attending: Emergency Medicine | Admitting: Emergency Medicine

## 2015-07-22 DIAGNOSIS — Z8589 Personal history of malignant neoplasm of other organs and systems: Secondary | ICD-10-CM | POA: Insufficient documentation

## 2015-07-22 DIAGNOSIS — Z791 Long term (current) use of non-steroidal anti-inflammatories (NSAID): Secondary | ICD-10-CM | POA: Insufficient documentation

## 2015-07-22 DIAGNOSIS — I10 Essential (primary) hypertension: Secondary | ICD-10-CM | POA: Insufficient documentation

## 2015-07-22 DIAGNOSIS — F329 Major depressive disorder, single episode, unspecified: Secondary | ICD-10-CM | POA: Insufficient documentation

## 2015-07-22 DIAGNOSIS — N938 Other specified abnormal uterine and vaginal bleeding: Secondary | ICD-10-CM | POA: Diagnosis not present

## 2015-07-22 DIAGNOSIS — F17200 Nicotine dependence, unspecified, uncomplicated: Secondary | ICD-10-CM | POA: Diagnosis not present

## 2015-07-22 DIAGNOSIS — N939 Abnormal uterine and vaginal bleeding, unspecified: Secondary | ICD-10-CM | POA: Diagnosis present

## 2015-07-22 LAB — WET PREP, GENITAL
Clue Cells Wet Prep HPF POC: NONE SEEN
Sperm: NONE SEEN
Trich, Wet Prep: NONE SEEN
WBC, Wet Prep HPF POC: NONE SEEN
Yeast Wet Prep HPF POC: NONE SEEN

## 2015-07-22 LAB — URINALYSIS, ROUTINE W REFLEX MICROSCOPIC
Bilirubin Urine: NEGATIVE
Glucose, UA: NEGATIVE mg/dL
Ketones, ur: NEGATIVE mg/dL
LEUKOCYTES UA: NEGATIVE
NITRITE: NEGATIVE
PH: 6 (ref 5.0–8.0)
Protein, ur: 30 mg/dL — AB
SPECIFIC GRAVITY, URINE: 1.025 (ref 1.005–1.030)

## 2015-07-22 LAB — BASIC METABOLIC PANEL WITH GFR
Anion gap: 5 (ref 5–15)
BUN: 10 mg/dL (ref 6–20)
CO2: 27 mmol/L (ref 22–32)
Calcium: 8.7 mg/dL — ABNORMAL LOW (ref 8.9–10.3)
Chloride: 106 mmol/L (ref 101–111)
Creatinine, Ser: 0.89 mg/dL (ref 0.44–1.00)
GFR calc Af Amer: >60 mL/min
GFR calc non Af Amer: >60 mL/min
Glucose, Bld: 82 mg/dL (ref 65–99)
Potassium: 4.3 mmol/L (ref 3.5–5.1)
Sodium: 138 mmol/L (ref 135–145)

## 2015-07-22 LAB — URINE MICROSCOPIC-ADD ON

## 2015-07-22 LAB — CBC WITH DIFFERENTIAL/PLATELET
Basophils Absolute: 0 10*3/uL (ref 0.0–0.1)
Basophils Relative: 1 %
EOS PCT: 3 %
Eosinophils Absolute: 0.1 10*3/uL (ref 0.0–0.7)
HEMATOCRIT: 36.6 % (ref 36.0–46.0)
Hemoglobin: 12.3 g/dL (ref 12.0–15.0)
LYMPHS PCT: 41 %
Lymphs Abs: 1.8 10*3/uL (ref 0.7–4.0)
MCH: 30.9 pg (ref 26.0–34.0)
MCHC: 33.6 g/dL (ref 30.0–36.0)
MCV: 92 fL (ref 78.0–100.0)
MONO ABS: 0.3 10*3/uL (ref 0.1–1.0)
MONOS PCT: 7 %
NEUTROS ABS: 2.2 10*3/uL (ref 1.7–7.7)
Neutrophils Relative %: 48 %
PLATELETS: 234 10*3/uL (ref 150–400)
RBC: 3.98 MIL/uL (ref 3.87–5.11)
RDW: 14.2 % (ref 11.5–15.5)
WBC: 4.4 10*3/uL (ref 4.0–10.5)

## 2015-07-22 LAB — POC URINE PREG, ED: Preg Test, Ur: NEGATIVE

## 2015-07-22 MED ORDER — SODIUM CHLORIDE 0.9 % IV BOLUS (SEPSIS)
1000.0000 mL | Freq: Once | INTRAVENOUS | Status: AC
  Administered 2015-07-22: 1000 mL via INTRAVENOUS

## 2015-07-22 MED ORDER — IBUPROFEN 400 MG PO TABS: 400.0000 mg | ORAL_TABLET | Freq: Four times a day (QID) | ORAL | Status: DC | PRN

## 2015-07-22 MED ORDER — KETOROLAC TROMETHAMINE 30 MG/ML IJ SOLN
30.0000 mg | Freq: Once | INTRAMUSCULAR | Status: AC
  Administered 2015-07-22: 30 mg via INTRAVENOUS
  Filled 2015-07-22: qty 1

## 2015-07-22 MED ORDER — HYDROCODONE-ACETAMINOPHEN 5-325 MG PO TABS: 1.0000 | ORAL_TABLET | ORAL | Status: DC | PRN

## 2015-07-22 NOTE — ED Provider Notes (Signed)
CSN: [id number]     Arrival date & time 07/22/15  1036 History  By signing my name below, I, Kristin Simmons, attest that this documentation has been prepared under the direction and in the presence of No att. providers found.   Electronically Signed: Nicole Simmons, ED Scribe. 07/22/2015. 12:36 PM   Chief Complaint  Patient presents with  . Vaginal Bleeding    The history is provided by the patient. No language interpreter was used.   HPI Comments: Kristin Simmons is a 30 y.o. female who presents to the Emergency Department complaining of gradual onset, vaginal bleeding, ongoing since last night. When pt woke up this morning, she reports blood "was covering the bed". Pt reports mild, cramping, abdominal pain. She states her last normal period was in January but she had spotting last month. Pt does not believe she is pregnant. No other associated symptoms noted. No worsening or alleviating factors noted. Pt denies any other pertinent symptoms.  Past Medical History  Diagnosis Date  . Hypertension   . Depression   . Anxiety   . Cancer [facility])     cervical   History reviewed. No pertinent past surgical history. History reviewed. No pertinent family history. Social History  Substance Use Topics  . Smoking status: Current Every Day Smoker -- 1.00 packs/day  . Smokeless tobacco: None  . Alcohol Use: 0.6 oz/week    1 Cans of beer per week     Comment: daily   OB History    No data available     Review of Systems  Gastrointestinal: Positive for abdominal pain.  Genitourinary: Positive for vaginal bleeding.  All other systems reviewed and are negative.    Allergies  Latex  Home Medications   Prior to Admission medications   Medication Sig Start Date End Date Taking? Authorizing Provider  HYDROcodone-acetaminophen (NORCO/VICODIN) 5-325 MG tablet Take 1 tablet by mouth every 4 (four) hours as needed. 07/22/15   Isla Pence, MD  ibuprofen (ADVIL,MOTRIN) 400 MG tablet Take  1 tablet (400 mg total) by mouth every 6 (six) hours as needed. 07/22/15   Isla Pence, MD   BP 153/112 mmHg  Pulse 71  Temp(Src) 98.2 F (36.8 C) (Oral)  Resp 16  Ht 5\' 4"  (1.626 m)  Wt 112 lb (50.803 kg)  BMI 19.22 kg/m2  SpO2 100%  LMP 02/21/2015 Physical Exam  Constitutional: She is oriented to person, place, and time. She appears well-developed and well-nourished.  HENT:  Head: Normocephalic.  Eyes: EOM are normal.  Neck: Normal range of motion.  Cardiovascular: Normal rate, regular rhythm and normal heart sounds.  Exam reveals no gallop.   No murmur heard. Pulmonary/Chest: Effort normal and breath sounds normal. No respiratory distress. She has no wheezes. She has no rales.  Abdominal: Soft. Bowel sounds are normal. She exhibits no distension. There is tenderness. There is no rebound and no guarding.  Suprapubic TTP.  Genitourinary: Uterus is tender. Right adnexum displays no tenderness. Left adnexum displays no tenderness. There is bleeding in the vagina.  Musculoskeletal: Normal range of motion.  Neurological: She is alert and oriented to person, place, and time.  Psychiatric: She has a normal mood and affect.  Nursing note and vitals reviewed.   ED Course  Procedures (including critical care time) DIAGNOSTIC STUDIES: Oxygen Saturation is 100% on RA, normal by my interpretation.    COORDINATION OF CARE: 11:02 AM Discussed treatment plan with pt at bedside and pt agreed to plan.  Labs Review Labs  Reviewed  BASIC METABOLIC PANEL - Abnormal; Notable for the following:    Calcium 8.7 (*)    All other components within normal limits  URINALYSIS, ROUTINE W REFLEX MICROSCOPIC (NOT AT [facility]) - Abnormal; Notable for the following:    Hgb urine dipstick LARGE (*)    Protein, ur 30 (*)    All other components within normal limits  URINE MICROSCOPIC-ADD ON - Abnormal; Notable for the following:    Squamous Epithelial / LPF 0-5 (*)    Bacteria, UA RARE (*)    All other  components within normal limits  WET PREP, GENITAL  CBC WITH DIFFERENTIAL/PLATELET  POC URINE PREG, ED  GC/CHLAMYDIA PROBE AMP ([place]) NOT AT [employer]    Imaging Review No results found. I have personally reviewed and evaluated these images and lab results as part of my medical decision-making.   EKG Interpretation None      MDM  PT FEELS A LITTLE BETTER.  I SUSPECT HEAVY BLEEDING IS DUE TO ABNORMAL PERIODS FOR THE LAST FEW MONTHS.   VITALS ARE STABLE.  NO INDICATION FOR PROVERA AT THIS TIME AS BLEEDING STARTED ONLY TODAY AND IT IS NOT TOO BAD ON EXAM.  PT IS INSTR TO F/U WITH OBGYN.  RETURN IF WORSE. Final diagnoses:  DUB (dysfunctional uterine bleeding)    I personally performed the services described in this documentation, which was scribed in my presence. The recorded information has been reviewed and is accurate.   Isla Pence, MD 07/22/15 [phone]

## 2015-07-22 NOTE — Discharge Instructions (Signed)

## 2015-07-22 NOTE — ED Notes (Signed)
Pt reports vaginal bleeding and abdominal cramping since this morning with clots, pt reports last "real" period was in January but spotted last month.  PT denies being pregnant.

## 2015-07-22 NOTE — ED Notes (Signed)
MD at bedside. 

## 2015-07-23 LAB — GC/CHLAMYDIA PROBE AMP (~~LOC~~) NOT AT ARMC
CHLAMYDIA, DNA PROBE: NEGATIVE
NEISSERIA GONORRHEA: NEGATIVE

## 2015-11-29 ENCOUNTER — Encounter (HOSPITAL_COMMUNITY): Payer: Self-pay | Admitting: *Deleted

## 2015-11-29 ENCOUNTER — Emergency Department (HOSPITAL_COMMUNITY)
Admission: EM | Admit: 2015-11-29 | Discharge: 2015-11-29 | Disposition: A | Discharge status: Home / Self Care | Payer: Medicaid Other | Attending: Emergency Medicine | Admitting: Emergency Medicine

## 2015-11-29 ENCOUNTER — Emergency Department (HOSPITAL_COMMUNITY): Payer: Medicaid Other

## 2015-11-29 DIAGNOSIS — Z8541 Personal history of malignant neoplasm of cervix uteri: Secondary | ICD-10-CM | POA: Insufficient documentation

## 2015-11-29 DIAGNOSIS — I10 Essential (primary) hypertension: Secondary | ICD-10-CM | POA: Diagnosis not present

## 2015-11-29 DIAGNOSIS — F17200 Nicotine dependence, unspecified, uncomplicated: Secondary | ICD-10-CM | POA: Diagnosis not present

## 2015-11-29 DIAGNOSIS — M549 Dorsalgia, unspecified: Secondary | ICD-10-CM | POA: Insufficient documentation

## 2015-11-29 DIAGNOSIS — J40 Bronchitis, not specified as acute or chronic: Secondary | ICD-10-CM | POA: Diagnosis not present

## 2015-11-29 DIAGNOSIS — Z91040 Latex allergy status: Secondary | ICD-10-CM | POA: Insufficient documentation

## 2015-11-29 DIAGNOSIS — R05 Cough: Secondary | ICD-10-CM | POA: Diagnosis present

## 2015-11-29 LAB — PREGNANCY, URINE: Preg Test, Ur: POSITIVE — AB

## 2015-11-29 MED ORDER — AMOXICILLIN 500 MG PO CAPS: 500.0000 mg | ORAL_CAPSULE | Freq: Three times a day (TID) | ORAL | 0 refills | Status: DC

## 2015-11-29 MED ORDER — ALBUTEROL SULFATE HFA 108 (90 BASE) MCG/ACT IN AERS
2.0000 | INHALATION_SPRAY | RESPIRATORY_TRACT | Status: DC | PRN
  Administered 2015-11-29: 2 via RESPIRATORY_TRACT
  Filled 2015-11-29: qty 6.7

## 2015-11-29 NOTE — ED Provider Notes (Signed)
[facility] DEPT Provider Note   CSN: [id number] Arrival date & time: 11/29/15  1105  By signing my name below, I, Avnee Patel, attest that this documentation has been prepared under the direction and in the presence of Milton Ferguson, MD  Electronically Signed: Delton Prairie, ED Scribe. 11/29/15. 11:32 AM.   History   Chief Complaint Chief Complaint  Patient presents with  . Cough  . Diarrhea    The history is provided by the patient. No language interpreter was used.  Cough  The current episode started yesterday. The problem has not changed since onset.There has been no fever. Associated symptoms include sore throat, shortness of breath and wheezing. Pertinent negatives include no chest pain and no headaches. She has tried nothing for the symptoms. She is a smoker.  Diarrhea  Associated symptoms include cough. Pertinent negatives include no abdominal pain and no headaches.   HPI Comments:  Kristin Simmons is a 30 y.o. female who presents to the Emergency Department complaining of a productive cough x 1 day. Pt state her cough produces yellow phlegm. She notes associated sore throat, wheezing, SOB, chest discomfort and mild back pain. Pt states she is a smoker and smokes ~ 1 pack a day. No alleviating factors noted. Pt denies any other complaints at this time.   Past Medical History:  Diagnosis Date  . Anxiety   . Cancer (HCC)    cervical  . Depression   . Hypertension     There are no active problems to display for this patient.   No past surgical history on file.  OB History    No data available       Home Medications    Prior to Admission medications   Medication Sig Start Date End Date Taking? Authorizing Provider  HYDROcodone-acetaminophen (NORCO/VICODIN) 5-325 MG tablet Take 1 tablet by mouth every 4 (four) hours as needed. 07/22/15   Isla Pence, MD  ibuprofen (ADVIL,MOTRIN) 400 MG tablet Take 1 tablet (400 mg total) by mouth every 6 (six) hours as  needed. 07/22/15   Isla Pence, MD    Family History No family history on file.  Social History Social History  Substance Use Topics  . Smoking status: Current Every Day Smoker    Packs/day: 1.00  . Smokeless tobacco: Never Used  . Alcohol use 0.6 oz/week    1 Cans of beer per week     Comment: daily     Allergies   Latex   Review of Systems Review of Systems  Constitutional: Negative for appetite change and fatigue.  HENT: Positive for sore throat. Negative for congestion, ear discharge and sinus pressure.   Eyes: Negative for discharge.  Respiratory: Positive for cough, chest tightness, shortness of breath and wheezing.   Cardiovascular: Negative for chest pain.  Gastrointestinal: Negative for abdominal pain and diarrhea.  Genitourinary: Negative for frequency and hematuria.  Musculoskeletal: Positive for back pain.  Skin: Negative for rash.  Neurological: Negative for seizures and headaches.  Psychiatric/Behavioral: Negative for hallucinations.     Physical Exam Updated Vital Signs BP 117/83 (BP Location: Left Arm)   Pulse 82   Temp 97.9 F (36.6 C) (Temporal)   Resp 18   Ht 5\' 4"  (1.626 m)   Wt 110 lb (49.9 kg)   LMP 10/27/2015   SpO2 100%   BMI 18.88 kg/m   Physical Exam  Constitutional: She is oriented to person, place, and time. She appears well-developed.  HENT:  Head: Normocephalic.  Eyes: Conjunctivae  and EOM are normal. No scleral icterus.  Neck: Neck supple. No thyromegaly present.  Cardiovascular: Normal rate and regular rhythm.  Exam reveals no gallop and no friction rub.   No murmur heard. Pulmonary/Chest: No stridor. She has wheezes. She has no rales. She exhibits no tenderness.  Mild wheezing throughout.   Abdominal: She exhibits no distension. There is no tenderness. There is no rebound.  Musculoskeletal: Normal range of motion. She exhibits no edema.  Lymphadenopathy:    She has no cervical adenopathy.  Neurological: She is  oriented to person, place, and time. She exhibits normal muscle tone. Coordination normal.  Skin: No rash noted. No erythema.  Psychiatric: She has a normal mood and affect. Her behavior is normal.     ED Treatments / Results  DIAGNOSTIC STUDIES:  Oxygen Saturation is 100% on RA, normal by my interpretation.    COORDINATION OF CARE:  11:29 AM Discussed treatment plan with pt at bedside and pt agreed to plan.  Labs (all labs ordered are listed, but only abnormal results are displayed) Labs Reviewed - No data to display  EKG  EKG Interpretation None       Radiology No results found.  Procedures Procedures (including critical care time)  Medications Ordered in ED Medications - No data to display   Initial Impression / Assessment and Plan / ED Course  I have reviewed the triage vital signs and the nursing notes.  Pertinent labs & imaging results that were available during my care of the patient were reviewed by me and considered in my medical decision making (see chart for details).  Clinical Course      Final Clinical Impressions(s) / ED Diagnoses   Final diagnoses:  None    New Prescriptions New Prescriptions   No medications on file       Milton Ferguson, MD 12/02/15 1237

## 2015-11-29 NOTE — Discharge Instructions (Signed)
Use the albuterol inhaler every 4-6 hours as needed for wheezing and cough. Follow-up with your family doctor if not improving. [facility] with [facility] for [facility]

## 2015-11-29 NOTE — ED Triage Notes (Addendum)
Pt comes in with cough and nasal congestion starting yesterday day. Sore throat is present. Denies any vomiting. Pt has had diarrhea for 3 days. Denies abdominal pain.   Also, pt states she is late for her period and thinks she could be pregnant.

## 2015-11-29 NOTE — ED Provider Notes (Signed)
[facility] DEPT Provider Note   CSN: [id number] Arrival date & time: 11/29/15  1105     History   Chief Complaint Chief Complaint  Patient presents with  . Cough  . Diarrhea    HPI Kristin Simmons is a 30 y.o. female.  HPI  Past Medical History:  Diagnosis Date  . Anxiety   . Cancer (HCC)    cervical  . Depression   . Hypertension     There are no active problems to display for this patient.   No past surgical history on file.  OB History    No data available       Home Medications    Prior to Admission medications   Medication Sig Start Date End Date Taking? Authorizing Provider  amoxicillin (AMOXIL) 500 MG capsule Take 1 capsule (500 mg total) by mouth 3 (three) times daily. 11/29/15   Milton Ferguson, MD  HYDROcodone-acetaminophen (NORCO/VICODIN) 5-325 MG tablet Take 1 tablet by mouth every 4 (four) hours as needed. Patient not taking: Reported on 11/29/2015 07/22/15   Isla Pence, MD  ibuprofen (ADVIL,MOTRIN) 400 MG tablet Take 1 tablet (400 mg total) by mouth every 6 (six) hours as needed. Patient not taking: Reported on 11/29/2015 07/22/15   Isla Pence, MD    Family History No family history on file.  Social History Social History  Substance Use Topics  . Smoking status: Current Every Day Smoker    Packs/day: 1.00  . Smokeless tobacco: Never Used  . Alcohol use 0.6 oz/week    1 Cans of beer per week     Comment: daily     Allergies   Latex   Review of Systems Review of Systems   Physical Exam Updated Vital Signs BP 117/83 (BP Location: Left Arm)   Pulse 82   Temp 97.9 F (36.6 C) (Temporal)   Resp 18   Ht 5\' 4"  (1.626 m)   Wt 110 lb (49.9 kg)   LMP 10/27/2015   SpO2 100%   BMI 18.88 kg/m   Physical Exam   ED Treatments / Results  Labs (all labs ordered are listed, but only abnormal results are displayed) Labs Reviewed  PREGNANCY, URINE - Abnormal; Notable for the following:       Result Value   Preg Test,  Ur POSITIVE (*)    All other components within normal limits    EKG  EKG Interpretation None       Radiology Dg Chest 2 View  Result Date: 11/29/2015 CLINICAL DATA:  Cough and congestion EXAM: CHEST  2 VIEW COMPARISON:  August 13, 2014 FINDINGS: The heart size and mediastinal contours are within normal limits. Both lungs are clear. The visualized skeletal structures are unremarkable. IMPRESSION: No active cardiopulmonary disease. Electronically Signed   By: Dorise Bullion III M.D   On: 11/29/2015 12:09    Procedures Procedures (including critical care time)  Medications Ordered in ED Medications  albuterol (PROVENTIL HFA;VENTOLIN HFA) 108 (90 Base) MCG/ACT inhaler 2 puff (2 puffs Inhalation Given 11/29/15 1213)     Initial Impression / Assessment and Plan / ED Course  I have reviewed the triage vital signs and the nursing notes.  Pertinent labs & imaging results that were available during my care of the patient were reviewed by me and considered in my medical decision making (see chart for details).  Clinical Course    Patient with bronchitis and bronchospasm. She is put on amoxicillin and albuterol. Patient also had a positive pregnancy  test. According to her last menstrual period she should be about [redacted] weeks pregnant. Patient will follow-up with OB/GYN  The chart was scribed for me under my direct supervision.  I personally performed the history, physical, and medical decision making and all procedures in the evaluation of this patient..   Final Clinical Impressions(s) / ED Diagnoses   Final diagnoses:  Bronchitis    New Prescriptions New Prescriptions   AMOXICILLIN (AMOXIL) 500 MG CAPSULE    Take 1 capsule (500 mg total) by mouth 3 (three) times daily.     Milton Ferguson, MD 11/29/15 [phone]

## 2015-12-08 ENCOUNTER — Ambulatory Visit (INDEPENDENT_AMBULATORY_CARE_PROVIDER_SITE_OTHER): Payer: Medicaid Other | Admitting: Adult Health

## 2015-12-08 ENCOUNTER — Encounter: Payer: Self-pay | Admitting: Adult Health

## 2015-12-08 VITALS — BP 130/60 | HR 94 | Ht 64.0 in | Wt 103.0 lb

## 2015-12-08 DIAGNOSIS — F329 Major depressive disorder, single episode, unspecified: Secondary | ICD-10-CM

## 2015-12-08 DIAGNOSIS — Z3201 Encounter for pregnancy test, result positive: Secondary | ICD-10-CM

## 2015-12-08 DIAGNOSIS — N938 Other specified abnormal uterine and vaginal bleeding: Secondary | ICD-10-CM

## 2015-12-08 DIAGNOSIS — O3680X0 Pregnancy with inconclusive fetal viability, not applicable or unspecified: Secondary | ICD-10-CM

## 2015-12-08 DIAGNOSIS — Z3490 Encounter for supervision of normal pregnancy, unspecified, unspecified trimester: Secondary | ICD-10-CM

## 2015-12-08 DIAGNOSIS — F17200 Nicotine dependence, unspecified, uncomplicated: Secondary | ICD-10-CM | POA: Diagnosis not present

## 2015-12-08 DIAGNOSIS — N926 Irregular menstruation, unspecified: Secondary | ICD-10-CM

## 2015-12-08 DIAGNOSIS — F32A Depression, unspecified: Secondary | ICD-10-CM

## 2015-12-08 LAB — POCT URINE PREGNANCY: PREG TEST UR: POSITIVE — AB

## 2015-12-08 MED ORDER — PRENATAL PLUS 27-1 MG PO TABS: 1.0000 | ORAL_TABLET | Freq: Every day | ORAL | 11 refills | Status: DC

## 2015-12-08 MED ORDER — ESCITALOPRAM OXALATE 10 MG PO TABS: 10.0000 mg | ORAL_TABLET | Freq: Every day | ORAL | 11 refills | Status: DC

## 2015-12-08 NOTE — Progress Notes (Signed)
Subjective:     Patient ID: Kristin Simmons, female   DOB: 1985/08/22, 30 y.o.   MRN: [id number]  HPI [name] is a 30 year old black female,single, in for UPT, has missed a period then had spotting but it stopped.She is a smoker but is trying to decrease her cigarettes. She has history of depression but is not on meds. She has been in jail in past, but has her children.  Review of Systems +missed period, then spotted but it stopped Reviewed past medical,surgical, social and family history. Reviewed medications and allergies.     Objective:   Physical Exam BP 130/60 (BP Location: Left Arm, Patient Position: Sitting, Cuff Size: Normal)   Pulse 94   Ht 5\' 4"  (1.626 m)   Wt 103 lb (46.7 kg)   LMP 10/27/2015 (Approximate)   BMI 17.68 kg/m    UPT +, about 6 weeks by LMP with EDD 08/02/16, Skin warm and dry. Neck: mid line trachea, normal thyroid, good ROM, no lymphadenopathy noted. Lungs: clear to ausculation bilaterally. Cardiovascular: regular rate and rhythm.Abdomen is soft and non tender, PHQ 9 score 13, and she says she is depressed but denies any suicidal ideations. Will Rx lexapro, she thinks she has taken before in past.Medicaid form given.  Face time 20 minutes.  Assessment:     1. Pregnancy examination or test, positive result   2. Pregnancy, unspecified gestational age   38. Current every day smoker   4. Depression, unspecified depression type   5. Encounter to determine fetal viability of pregnancy, not applicable or unspecified fetus       Plan:     Meds ordered this encounter  Medications  . prenatal vitamin w/FE, FA (PRENATAL 1 + 1) 27-1 MG TABS tablet    Sig: Take 1 tablet by mouth daily at 12 noon.    Dispense:  30 each    Refill:  11    Order Specific Question:   Supervising Provider    Answer:   Elonda Husky, LUTHER H [[id number]]  . escitalopram (LEXAPRO) 10 MG tablet    Sig: Take 1 tablet (10 mg total) by mouth daily.    Dispense:  30 tablet    Refill:  11    Order  Specific Question:   Supervising Provider    Answer:   Tania Ade H [[id number]]  Take lexapro daily   Return in 1 week for dating [place] Decrease cigarettes Review handout on first trimester

## 2015-12-08 NOTE — Patient Instructions (Addendum)
First Trimester of Pregnancy The first trimester of pregnancy is from week 1 until the end of week 12 (months 1 through 3). A week after a sperm fertilizes an egg, the egg will implant on the wall of the uterus. This embryo will begin to develop into a baby. Genes from you and your partner are forming the baby. The female genes determine whether the baby is a boy or a girl. At 6-8 weeks, the eyes and face are formed, and the heartbeat can be seen on ultrasound. At the end of 12 weeks, all the baby's organs are formed.  Now that you are pregnant, you will want to do everything you can to have a healthy baby. Two of the most important things are to get good prenatal care and to follow your health care provider's instructions. Prenatal care is all the medical care you receive before the baby's birth. This care will help prevent, find, and treat any problems during the pregnancy and childbirth. BODY CHANGES Your body goes through many changes during pregnancy. The changes vary from woman to woman.   You may gain or lose a couple of pounds at first.  You may feel sick to your stomach (nauseous) and throw up (vomit). If the vomiting is uncontrollable, call your health care provider.  You may tire easily.  You may develop headaches that can be relieved by medicines approved by your health care provider.  You may urinate more often. Painful urination may mean you have a bladder infection.  You may develop heartburn as a result of your pregnancy.  You may develop constipation because certain hormones are causing the muscles that push waste through your intestines to slow down.  You may develop hemorrhoids or swollen, bulging veins (varicose veins).  Your breasts may begin to grow larger and become tender. Your nipples may stick out more, and the tissue that surrounds them (areola) may become darker.  Your gums may bleed and may be sensitive to brushing and flossing.  Dark spots or blotches (chloasma,  mask of pregnancy) may develop on your face. This will likely fade after the baby is born.  Your menstrual periods will stop.  You may have a loss of appetite.  You may develop cravings for certain kinds of food.  You may have changes in your emotions from day to day, such as being excited to be pregnant or being concerned that something may go wrong with the pregnancy and baby.  You may have more vivid and strange dreams.  You may have changes in your hair. These can include thickening of your hair, rapid growth, and changes in texture. Some women also have hair loss during or after pregnancy, or hair that feels dry or thin. Your hair will most likely return to normal after your baby is born. WHAT TO EXPECT AT YOUR PRENATAL VISITS During a routine prenatal visit:  You will be weighed to make sure you and the baby are growing normally.  Your blood pressure will be taken.  Your abdomen will be measured to track your baby's growth.  The fetal heartbeat will be listened to starting around week 10 or 12 of your pregnancy.  Test results from any previous visits will be discussed. Your health care provider may ask you:  How you are feeling.  If you are feeling the baby move.  If you have had any abnormal symptoms, such as leaking fluid, bleeding, severe headaches, or abdominal cramping.  If you are using any tobacco products,   including cigarettes, chewing tobacco, and electronic cigarettes.  If you have any questions. Other tests that may be performed during your first trimester include:  Blood tests to find your blood type and to check for the presence of any previous infections. They will also be used to check for low iron levels (anemia) and Rh antibodies. Later in the pregnancy, blood tests for diabetes will be done along with other tests if problems develop.  Urine tests to check for infections, diabetes, or protein in the urine.  An ultrasound to confirm the proper growth  and development of the baby.  An amniocentesis to check for possible genetic problems.  Fetal screens for spina bifida and Down syndrome.  You may need other tests to make sure you and the baby are doing well.  HIV (human immunodeficiency virus) testing. Routine prenatal testing includes screening for HIV, unless you choose not to have this test. HOME CARE INSTRUCTIONS  Medicines  Follow your health care provider's instructions regarding medicine use. Specific medicines may be either safe or unsafe to take during pregnancy.  Take your prenatal vitamins as directed.  If you develop constipation, try taking a stool softener if your health care provider approves. Diet  Eat regular, well-balanced meals. Choose a variety of foods, such as meat or vegetable-based protein, fish, milk and low-fat dairy products, vegetables, fruits, and whole grain breads and cereals. Your health care provider will help you determine the amount of weight gain that is right for you.  Avoid raw meat and uncooked cheese. These carry germs that can cause birth defects in the baby.  Eating four or five small meals rather than three large meals a day may help relieve nausea and vomiting. If you start to feel nauseous, eating a few soda crackers can be helpful. Drinking liquids between meals instead of during meals also seems to help nausea and vomiting.  If you develop constipation, eat more high-fiber foods, such as fresh vegetables or fruit and whole grains. Drink enough fluids to keep your urine clear or pale yellow. Activity and Exercise  Exercise only as directed by your health care provider. Exercising will help you:  Control your weight.  Stay in shape.  Be prepared for labor and delivery.  Experiencing pain or cramping in the lower abdomen or low back is a good sign that you should stop exercising. Check with your health care provider before continuing normal exercises.  Try to avoid standing for long  periods of time. Move your legs often if you must stand in one place for a long time.  Avoid heavy lifting.  Wear low-heeled shoes, and practice good posture.  You may continue to have sex unless your health care provider directs you otherwise. Relief of Pain or Discomfort  Wear a good support bra for breast tenderness.   Take warm sitz baths to soothe any pain or discomfort caused by hemorrhoids. Use hemorrhoid cream if your health care provider approves.   Rest with your legs elevated if you have leg cramps or low back pain.  If you develop varicose veins in your legs, wear support hose. Elevate your feet for 15 minutes, 3-4 times a day. Limit salt in your diet. Prenatal Care  Schedule your prenatal visits by the twelfth week of pregnancy. They are usually scheduled monthly at first, then more often in the last 2 months before delivery.  Write down your questions. Take them to your prenatal visits.  Keep all your prenatal visits as directed by your   health care provider. Safety  Wear your seat belt at all times when driving.  Make a list of emergency phone numbers, including numbers for family, friends, the hospital, and police and fire departments. General Tips  Ask your health care provider for a referral to a local prenatal education class. Begin classes no later than at the beginning of month 6 of your pregnancy.  Ask for help if you have counseling or nutritional needs during pregnancy. Your health care provider can offer advice or refer you to specialists for help with various needs.  Do not use hot tubs, steam rooms, or saunas.  Do not douche or use tampons or scented sanitary pads.  Do not cross your legs for long periods of time.  Avoid cat litter boxes and soil used by cats. These carry germs that can cause birth defects in the baby and possibly loss of the fetus by miscarriage or stillbirth.  Avoid all smoking, herbs, alcohol, and medicines not prescribed by  your health care provider. Chemicals in these affect the formation and growth of the baby.  Do not use any tobacco products, including cigarettes, chewing tobacco, and electronic cigarettes. If you need help quitting, ask your health care provider. You may receive counseling support and other resources to help you quit.  Schedule a dentist appointment. At home, brush your teeth with a soft toothbrush and be gentle when you floss. SEEK MEDICAL CARE IF:   You have dizziness.  You have mild pelvic cramps, pelvic pressure, or nagging pain in the abdominal area.  You have persistent nausea, vomiting, or diarrhea.  You have a bad smelling vaginal discharge.  You have pain with urination.  You notice increased swelling in your face, hands, legs, or ankles. SEEK IMMEDIATE MEDICAL CARE IF:   You have a fever.  You are leaking fluid from your vagina.  You have spotting or bleeding from your vagina.  You have severe abdominal cramping or pain.  You have rapid weight gain or loss.  You vomit blood or material that looks like coffee grounds.  You are exposed to [place] measles and have never had them.  You are exposed to fifth disease or chickenpox.  You develop a severe headache.  You have shortness of breath.  You have any kind of trauma, such as from a fall or a car accident.   This information is not intended to replace advice given to you by your health care provider. Make sure you discuss any questions you have with your health care provider.   Document Released: 01/25/2001 Document Revised: 02/21/2014 Document Reviewed: 12/11/2012 Elsevier Interactive Patient Education [employer]. Return in 1 week for [place]  Take lexapro  Decrease cigarettes

## 2015-12-15 ENCOUNTER — Ambulatory Visit (INDEPENDENT_AMBULATORY_CARE_PROVIDER_SITE_OTHER): Payer: Medicaid Other

## 2015-12-15 DIAGNOSIS — O3680X0 Pregnancy with inconclusive fetal viability, not applicable or unspecified: Secondary | ICD-10-CM | POA: Diagnosis not present

## 2015-12-15 DIAGNOSIS — Z3A09 9 weeks gestation of pregnancy: Secondary | ICD-10-CM

## 2015-12-15 NOTE — Progress Notes (Signed)
[place] 8+3 wks,single IUP w/ys,pos fht 152 bpm,normal ov's bilat,crl 19.5 mm

## 2015-12-24 ENCOUNTER — Encounter: Payer: Medicaid Other | Admitting: Women's Health

## 2015-12-24 ENCOUNTER — Encounter: Payer: Self-pay | Admitting: *Deleted

## 2016-01-13 ENCOUNTER — Other Ambulatory Visit (HOSPITAL_COMMUNITY)
Admission: RE | Admit: 2016-01-13 | Discharge: 2016-01-13 | Disposition: A | Discharge status: Home / Self Care | Payer: Medicaid Other | Source: Ambulatory Visit | Attending: Obstetrics & Gynecology | Admitting: Obstetrics & Gynecology

## 2016-01-13 ENCOUNTER — Ambulatory Visit (INDEPENDENT_AMBULATORY_CARE_PROVIDER_SITE_OTHER): Payer: Medicaid Other | Admitting: Women's Health

## 2016-01-13 ENCOUNTER — Encounter: Payer: Self-pay | Admitting: Women's Health

## 2016-01-13 VITALS — BP 102/70 | HR 74 | Wt 107.0 lb

## 2016-01-13 DIAGNOSIS — Z124 Encounter for screening for malignant neoplasm of cervix: Secondary | ICD-10-CM | POA: Diagnosis not present

## 2016-01-13 DIAGNOSIS — O09219 Supervision of pregnancy with history of pre-term labor, unspecified trimester: Secondary | ICD-10-CM

## 2016-01-13 DIAGNOSIS — Z113 Encounter for screening for infections with a predominantly sexual mode of transmission: Secondary | ICD-10-CM | POA: Diagnosis present

## 2016-01-13 DIAGNOSIS — O99311 Alcohol use complicating pregnancy, first trimester: Secondary | ICD-10-CM | POA: Diagnosis not present

## 2016-01-13 DIAGNOSIS — O99331 Smoking (tobacco) complicating pregnancy, first trimester: Secondary | ICD-10-CM

## 2016-01-13 DIAGNOSIS — Z01411 Encounter for gynecological examination (general) (routine) with abnormal findings: Secondary | ICD-10-CM | POA: Insufficient documentation

## 2016-01-13 DIAGNOSIS — O99341 Other mental disorders complicating pregnancy, first trimester: Secondary | ICD-10-CM | POA: Diagnosis not present

## 2016-01-13 DIAGNOSIS — F329 Major depressive disorder, single episode, unspecified: Secondary | ICD-10-CM

## 2016-01-13 DIAGNOSIS — Z3481 Encounter for supervision of other normal pregnancy, first trimester: Secondary | ICD-10-CM

## 2016-01-13 DIAGNOSIS — Z1151 Encounter for screening for human papillomavirus (HPV): Secondary | ICD-10-CM | POA: Insufficient documentation

## 2016-01-13 DIAGNOSIS — Z3490 Encounter for supervision of normal pregnancy, unspecified, unspecified trimester: Secondary | ICD-10-CM | POA: Insufficient documentation

## 2016-01-13 DIAGNOSIS — R636 Underweight: Secondary | ICD-10-CM | POA: Insufficient documentation

## 2016-01-13 DIAGNOSIS — Z3A13 13 weeks gestation of pregnancy: Secondary | ICD-10-CM

## 2016-01-13 DIAGNOSIS — F17200 Nicotine dependence, unspecified, uncomplicated: Secondary | ICD-10-CM

## 2016-01-13 DIAGNOSIS — O09899 Supervision of other high risk pregnancies, unspecified trimester: Secondary | ICD-10-CM | POA: Insufficient documentation

## 2016-01-13 DIAGNOSIS — F32A Depression, unspecified: Secondary | ICD-10-CM

## 2016-01-13 LAB — OB RESULTS CONSOLE GC/CHLAMYDIA: Gonorrhea: NEGATIVE

## 2016-01-13 MED ORDER — ESCITALOPRAM OXALATE 20 MG PO TABS: 20.0000 mg | ORAL_TABLET | Freq: Every day | ORAL | 6 refills | Status: DC

## 2016-01-13 MED ORDER — DOXYLAMINE-PYRIDOXINE 10-10 MG PO TBEC: DELAYED_RELEASE_TABLET | ORAL | 6 refills | Status: DC

## 2016-01-13 NOTE — Patient Instructions (Signed)

## 2016-01-13 NOTE — Progress Notes (Signed)
Subjective:  Kristin Simmons is a 30 y.o. [phone] African American female at [redacted]w[redacted]d by 8wk u/s, being seen today for her first obstetrical visit.  Her obstetrical history is significant for PTB x 2- 28 & 24wks d/t PTL, took 17P last pregnancy and delivered @ 40wks; etoh during pregnancy- drank margarita on her bday 12d ago- states she craved beer entire last pregnancy and drank small amts, smoker: 1ppd prior to pregnancy, now only 3-4/day and working on cutting those out; depression- was started on lexapro 10mg  at preg test visit, states she can't tell any difference. Denies SI/HI.  States prior to pregnancy drank ~ 3x/wk, has only had that one margarita since learning of pregnancy. Pregnancy history fully reviewed. H/O abnormal pap last year, never went back for f/u  Patient reports n/v- requests meds. Denies vb, cramping, uti s/s, abnormal/malodorous vag d/c, or vulvovaginal itching/irritation.  BP 102/70   Pulse 74   Wt 107 lb (48.5 kg)   LMP 10/27/2015 (Approximate)   BMI 18.37 kg/m   HISTORY: OB History  Gravida Para Term Preterm AB Living  5 3 1 2 1 3   SAB TAB Ectopic Multiple Live Births  1       3    # Outcome Date GA Lbr Len/2nd Weight Sex Delivery Anes PTL Lv  5 Current           4 SAB 2016          3 Term 12/02/13 [redacted]w[redacted]d  5 lb 4 oz (2.381 kg) F Vag-Spont   LIV  2 Preterm 06/17/07 [redacted]w[redacted]d  4 lb 11 oz (2.126 kg) F Vag-Spont   LIV  1 Preterm 12/11/05 [redacted]w[redacted]d  5 lb 9 oz (2.523 kg) F Vag-Spont   LIV     Past Medical History:  Diagnosis Date  . Anemia   . Anxiety   . Cancer (HCC)    cervical  . Depression   . Hypertension   . Vaginal Pap smear, abnormal    History reviewed. No pertinent surgical history. Family History  Problem Relation Age of Onset  . Alzheimer's disease Paternal Grandmother   . Cancer Maternal Grandmother   . Cancer Maternal Grandfather   . Hypertension Father   . Anemia Mother   . Hypertension Mother   . Thyroid disease Mother   . Diabetes Sister    . Hypertension Sister   . Mental illness Brother   . Asthma Daughter   . Bronchitis Daughter   . Asthma Daughter   . Bronchitis Daughter   . Asthma Daughter   . Bronchitis Daughter     Exam   System:     General: Well developed & nourished, no acute distress   Skin: Warm & dry, normal coloration and turgor, no rashes   Neurologic: Alert & oriented, normal mood   Cardiovascular: Regular rate & rhythm   Respiratory: Effort & rate normal, LCTAB, acyanotic   Abdomen: Soft, non tender   Extremities: normal strength, tone   Pelvic Exam:    Perineum: Normal perineum   Vulva: Normal, no lesions   Vagina:  Normal mucosa, normal discharge   Cervix: Normal, bulbous, appears closed   Uterus: Normal size/shape/contour for GA   Thin prep pap smear obtained w/ reflex high risk HPV cotesting FHR: 160 via doppler   Assessment:   Pregnancy: [id number] Patient Active Problem List   Diagnosis Date Noted  . Supervision of normal pregnancy 01/13/2016  . Smoker 01/13/2016  . Depression 01/13/2016  . Alcohol  use affecting pregnancy, first trimester 01/13/2016  . History of preterm delivery, currently pregnant 01/13/2016    [redacted]w[redacted]d [id number] New OB visit ETOH during pregnancy N/V Depression H/O PTB x 2 Smoker Underweight  Plan:  Initial labs drawn Continue prenatal vitamins Problem list reviewed and updated Reviewed n/v relief measures and warning s/s to report Rx diclegis, prior auth approved through [employer] today Advised no further ETOH use during pregnancy Reviewed recommended weight gain based on pre-gravid BMI Encouraged well-balanced diet Genetic Screening discussed Integrated Screen/AFP: declined Cystic fibrosis screening discussed declined Ultrasound discussed; fetal survey: requested Follow up in 2 weeks for visit [facility] completed Offered 17P, accepted, ordered today Advised smoking cessation, discussed risks to fetus while pregnant, to infant pp, and to herself.  Offered QuitlineNC, declined   Increased Lexapro to 20mg  daily Pt spoke w/ pregnancy care manager after visit today  Tawnya Crook CNM, [facility] 01/13/2016 1:27 PM

## 2016-01-14 LAB — CBC
HEMATOCRIT: 40 % (ref 34.0–46.6)
HEMOGLOBIN: 12.7 g/dL (ref 11.1–15.9)
MCH: 30.2 pg (ref 26.6–33.0)
MCHC: 31.8 g/dL (ref 31.5–35.7)
MCV: 95 fL (ref 79–97)
Platelets: 356 10*3/uL (ref 150–379)
RBC: 4.21 x10E6/uL (ref 3.77–5.28)
RDW: 13.7 % (ref 12.3–15.4)
WBC: 9.3 10*3/uL (ref 3.4–10.8)

## 2016-01-14 LAB — UNABLE TO VOID

## 2016-01-14 LAB — HIV ANTIBODY (ROUTINE TESTING W REFLEX): HIV Screen 4th Generation wRfx: NONREACTIVE

## 2016-01-14 LAB — RUBELLA SCREEN: RUBELLA: 1.74 {index} (ref >0.99)

## 2016-01-14 LAB — RPR: RPR: NONREACTIVE

## 2016-01-14 LAB — ABO/RH: Rh Factor: POSITIVE

## 2016-01-14 LAB — HEPATITIS B SURFACE ANTIGEN: HEP B S AG: NEGATIVE

## 2016-01-14 LAB — VARICELLA ZOSTER ANTIBODY, IGG: Varicella zoster IgG: 1623 index (ref >165)

## 2016-01-14 LAB — ANTIBODY SCREEN: ANTIBODY SCREEN: NEGATIVE

## 2016-01-15 LAB — CYTOLOGY - PAP
Chlamydia: NEGATIVE
DIAGNOSIS: UNDETERMINED — AB
HPV (WINDOPATH): NOT DETECTED
Neisseria Gonorrhea: NEGATIVE

## 2016-01-18 ENCOUNTER — Encounter: Payer: Self-pay | Admitting: Women's Health

## 2016-01-18 ENCOUNTER — Telehealth: Payer: Self-pay | Admitting: *Deleted

## 2016-01-18 DIAGNOSIS — R87619 Unspecified abnormal cytological findings in specimens from cervix uteri: Secondary | ICD-10-CM | POA: Insufficient documentation

## 2016-01-20 ENCOUNTER — Other Ambulatory Visit: Payer: Medicaid Other

## 2016-01-20 ENCOUNTER — Encounter: Payer: Medicaid Other | Admitting: Advanced Practice Midwife

## 2016-01-26 ENCOUNTER — Telehealth: Payer: Self-pay | Admitting: *Deleted

## 2016-01-26 NOTE — Telephone Encounter (Signed)
Spoke with pt. Pt had some bleeding after pap. Bleeding had stopped, but yesterday, she started bleeding again. Pt had sex. I advised it can be normal to have bleeding after a BM or after sex. Pt was advised to not have sex for 7 days from the last time she wipes any color. Pt voiced understanding. Pt missed appt last week so call was transferred to front desk to reschedule. [place]

## 2016-02-01 ENCOUNTER — Telehealth: Payer: Self-pay | Admitting: *Deleted

## 2016-02-01 NOTE — Telephone Encounter (Signed)
Pt aware pap was slightly abnormal and it needs to be repeated in 1 year. Pt voiced understanding. [place]

## 2016-02-04 ENCOUNTER — Encounter: Payer: Self-pay | Admitting: Obstetrics and Gynecology

## 2016-02-04 ENCOUNTER — Ambulatory Visit (INDEPENDENT_AMBULATORY_CARE_PROVIDER_SITE_OTHER): Payer: Medicaid Other | Admitting: Obstetrics and Gynecology

## 2016-02-04 ENCOUNTER — Other Ambulatory Visit: Payer: Medicaid Other

## 2016-02-04 VITALS — BP 104/60 | HR 79 | Wt 104.8 lb

## 2016-02-04 DIAGNOSIS — Z3482 Encounter for supervision of other normal pregnancy, second trimester: Secondary | ICD-10-CM

## 2016-02-04 DIAGNOSIS — Z3A16 16 weeks gestation of pregnancy: Secondary | ICD-10-CM | POA: Diagnosis not present

## 2016-02-04 DIAGNOSIS — O09899 Supervision of other high risk pregnancies, unspecified trimester: Secondary | ICD-10-CM

## 2016-02-04 DIAGNOSIS — Z331 Pregnant state, incidental: Secondary | ICD-10-CM | POA: Diagnosis not present

## 2016-02-04 DIAGNOSIS — O99332 Smoking (tobacco) complicating pregnancy, second trimester: Secondary | ICD-10-CM | POA: Diagnosis not present

## 2016-02-04 DIAGNOSIS — O09212 Supervision of pregnancy with history of pre-term labor, second trimester: Secondary | ICD-10-CM | POA: Diagnosis not present

## 2016-02-04 DIAGNOSIS — O09219 Supervision of pregnancy with history of pre-term labor, unspecified trimester: Secondary | ICD-10-CM

## 2016-02-04 DIAGNOSIS — Z1389 Encounter for screening for other disorder: Secondary | ICD-10-CM | POA: Diagnosis not present

## 2016-02-04 DIAGNOSIS — O99342 Other mental disorders complicating pregnancy, second trimester: Secondary | ICD-10-CM

## 2016-02-04 LAB — POCT URINALYSIS DIPSTICK
GLUCOSE UA: NEGATIVE
Ketones, UA: NEGATIVE
Leukocytes, UA: NEGATIVE
NITRITE UA: NEGATIVE
Protein, UA: NEGATIVE
RBC UA: NEGATIVE

## 2016-02-04 NOTE — Progress Notes (Signed)
Patient ID: Kristin Simmons, female   DOB: 02-Nov-1985, 30 y.o.   MRN: [id number]   High Risk Pregnancy HROB Diagnosis(es):   H/o PTB x 2 (28 & 24w)  [id number] [redacted]w[redacted]d Estimated Date of Delivery: 07/23/16    BP weight and urine results reviewed and noted. Blood pressure 104/60, pulse 79, weight 104 lb 12.8 oz (47.5 kg), last menstrual period 10/27/2015.  Urinalysis:NEGATIVE for all                  HPI: The patient is being seen today for ongoing management of the above.  Chief Complaint  Patient presents with  . Routine Prenatal Visit     Today she reports she had a syncopal episode last night, fell forward and struck her upper lip on the stove. She denies additional injuries and states she is doing otherwise well. Pt also states she is doing well avoiding alcohol after her last discussion in this office. She states she is interested in tubal ligation after delivery. Pt states her smallest child that delivered at 24w weighed 4 lbs.   Patient reports good fetal movement, denies any bleeding and no rupture of membranes symptoms or regular contractions.   Fundal Height:  10 cm Fetal Heart rate:  138 bpm Physical Examination: Abdomen - soft, nontender, nondistended, no masses or organomegaly                                     Edema:  none              HEENT: swollen upper lip with intact teeth and external 1.5 cm lac, in apposition. Fetal Surveillance Testing today:  none  Lab and sonogram results have been reviewed.  Assessment:  1.  Pregnancy at [redacted]w[redacted]d,  [id number]   :  Estimated Date of Delivery: 07/23/16                         2.  H/o PTB x 2 (28 & 24w) dubious history                         3 face/LIP injury allegedly due to fall. Medication(s) Plans:  Start 17P at [place]:  17P at 17w  Follow up in 2 weeks for appointment for high risk OB care, weekly for 17P   By signing my name below, I, Hansel Feinstein, attest that this documentation has been prepared under the  direction and in the presence of Jonnie Kind, MD. Electronically Signed: Hansel Feinstein, ED Scribe. 02/04/16. 10:52 AM.  I personally performed the services described in this documentation, which was SCRIBED in my presence. The recorded information has been reviewed and considered accurate. It has been edited as necessary during review. Jonnie Kind, MD

## 2016-02-05 LAB — URINE CULTURE: Organism ID, Bacteria: NO GROWTH

## 2016-02-12 NOTE — Telephone Encounter (Signed)
Was unable to reach pt by phone, she has been in for ov since.

## 2016-02-15 NOTE — L&D Delivery Note (Addendum)
31 y/o [id number] at [redacted]w[redacted]d, came by EMS in active labor, found to be complete with a bulging bag. Ampicillin x1, betamethasone x1, and magnesium 6mg  bolus was given prior to delivery. Patient delivered a viable female infant in cephalic, LOA position, en caul after two involuntary pushes. No nuchal cord. Baby vigorous and attempting to cry. Cord clamped x2 and cut, sent to warmer to be assessed by NICU team/NNT. Cord gas collected and sent. Placenta delivered spontaneously intact, with 3VC. Fundus firm on exam with massage and pitocin. Bladder emptied on bed pan Good hemostasis noted.  Anesthesia: None (attempted nitrous) Laceration: None Suture: N/A Good hemostasis noted. EBL: 61cc  Mom recovering in LDR.   Baby to NICU, vigorous.   Apgars: Pending, baby to NICU Weight: 610 gm; 1 lb 5.5 oz  Cord gas pH 7.39 (verbal)  Katherine Basset, DO [facility] [employer] [facility] [place] [facility] 04/11/2016, 9:17 PM   This note was originally written by me, has NOT been edited by Para March, RN.  Zenda Alpers, DO  04/18/2016 1:57 AM

## 2016-02-18 ENCOUNTER — Encounter: Payer: Medicaid Other | Admitting: Obstetrics and Gynecology

## 2016-02-18 ENCOUNTER — Encounter: Payer: Medicaid Other | Admitting: Advanced Practice Midwife

## 2016-02-18 ENCOUNTER — Encounter (INDEPENDENT_AMBULATORY_CARE_PROVIDER_SITE_OTHER): Payer: Self-pay

## 2016-02-19 ENCOUNTER — Encounter: Payer: Self-pay | Admitting: Obstetrics & Gynecology

## 2016-02-19 ENCOUNTER — Ambulatory Visit (INDEPENDENT_AMBULATORY_CARE_PROVIDER_SITE_OTHER): Payer: Medicaid Other | Admitting: Obstetrics & Gynecology

## 2016-02-19 VITALS — BP 95/48 | HR 87 | Wt 111.0 lb

## 2016-02-19 DIAGNOSIS — O99332 Smoking (tobacco) complicating pregnancy, second trimester: Secondary | ICD-10-CM

## 2016-02-19 DIAGNOSIS — O99342 Other mental disorders complicating pregnancy, second trimester: Secondary | ICD-10-CM

## 2016-02-19 DIAGNOSIS — Z3A18 18 weeks gestation of pregnancy: Secondary | ICD-10-CM

## 2016-02-19 DIAGNOSIS — Z3482 Encounter for supervision of other normal pregnancy, second trimester: Secondary | ICD-10-CM

## 2016-02-19 DIAGNOSIS — O09212 Supervision of pregnancy with history of pre-term labor, second trimester: Secondary | ICD-10-CM | POA: Diagnosis not present

## 2016-02-19 MED ORDER — HYDROXYPROGESTERONE CAPROATE 250 MG/ML IM OIL
250.0000 mg | TOPICAL_OIL | Freq: Once | INTRAMUSCULAR | Status: AC
  Administered 2016-02-19: 250 mg via INTRAMUSCULAR

## 2016-02-19 NOTE — Progress Notes (Signed)
[id number] [redacted]w[redacted]d Estimated Date of Delivery: 07/23/16  Blood pressure (!) 95/48, pulse 87, weight 111 lb (50.3 kg), last menstrual period 10/27/2015.   BP weight and urine results all reviewed and noted.  Please refer to the obstetrical flow sheet for the fundal height and fetal heart rate documentation:  Patient reports good fetal movement, denies any bleeding and no rupture of membranes symptoms or regular contractions. Patient is without complaints. All questions were answered.  No orders of the defined types were placed in this encounter.   Plan:  Continued routine obstetrical care, sonogram 2 weeks  No Follow-up on file.

## 2016-02-26 ENCOUNTER — Encounter: Payer: Self-pay | Admitting: *Deleted

## 2016-02-26 ENCOUNTER — Ambulatory Visit (INDEPENDENT_AMBULATORY_CARE_PROVIDER_SITE_OTHER): Payer: Medicaid Other | Admitting: *Deleted

## 2016-02-26 VITALS — BP 121/67 | HR 80 | Ht 64.0 in | Wt 112.5 lb

## 2016-02-26 DIAGNOSIS — Z331 Pregnant state, incidental: Secondary | ICD-10-CM

## 2016-02-26 DIAGNOSIS — Z1389 Encounter for screening for other disorder: Secondary | ICD-10-CM | POA: Diagnosis not present

## 2016-02-26 DIAGNOSIS — O09212 Supervision of pregnancy with history of pre-term labor, second trimester: Secondary | ICD-10-CM | POA: Diagnosis not present

## 2016-02-26 DIAGNOSIS — O09892 Supervision of other high risk pregnancies, second trimester: Secondary | ICD-10-CM

## 2016-02-26 DIAGNOSIS — Z3482 Encounter for supervision of other normal pregnancy, second trimester: Secondary | ICD-10-CM

## 2016-02-26 LAB — POCT URINALYSIS DIPSTICK
Blood, UA: NEGATIVE
GLUCOSE UA: NEGATIVE
KETONES UA: NEGATIVE
LEUKOCYTES UA: NEGATIVE
NITRITE UA: NEGATIVE
Protein, UA: NEGATIVE

## 2016-02-26 MED ORDER — HYDROXYPROGESTERONE CAPROATE 250 MG/ML IM OIL
250.0000 mg | TOPICAL_OIL | Freq: Once | INTRAMUSCULAR | Status: AC
  Administered 2016-02-26: 250 mg via INTRAMUSCULAR

## 2016-02-26 NOTE — Progress Notes (Signed)
Pt here for 17P. Pt tolerated shot well. Return in 1 week for next shot. JSY 

## 2016-02-27 LAB — PMP SCREEN PROFILE (10S), URINE
AMPHETAMINE SCRN UR: NEGATIVE ng/mL
BARBITURATE SCRN UR: NEGATIVE ng/mL
Benzodiazepine Screen, Urine: NEGATIVE ng/mL
CANNABINOIDS UR QL SCN: NEGATIVE ng/mL
COCAINE(METAB.) SCREEN, URINE: POSITIVE ng/mL
Creatinine(Crt), U: 37.4 mg/dL (ref 20.0–300.0)
METHADONE SCREEN, URINE: NEGATIVE ng/mL
OPIATE SCRN UR: NEGATIVE ng/mL
Oxycodone+Oxymorphone Ur Ql Scn: NEGATIVE ng/mL
PCP Scrn, Ur: NEGATIVE ng/mL
Ph of Urine: 7.9 (ref 4.5–8.9)
Propoxyphene, Screen: NEGATIVE ng/mL

## 2016-03-04 ENCOUNTER — Other Ambulatory Visit: Payer: Self-pay | Admitting: Obstetrics & Gynecology

## 2016-03-04 ENCOUNTER — Ambulatory Visit: Payer: Medicaid Other

## 2016-03-04 ENCOUNTER — Encounter: Payer: Self-pay | Admitting: *Deleted

## 2016-03-04 ENCOUNTER — Ambulatory Visit (INDEPENDENT_AMBULATORY_CARE_PROVIDER_SITE_OTHER): Payer: Medicaid Other | Admitting: *Deleted

## 2016-03-04 VITALS — BP 112/50 | HR 86 | Ht 64.0 in | Wt 112.0 lb

## 2016-03-04 DIAGNOSIS — Z363 Encounter for antenatal screening for malformations: Secondary | ICD-10-CM

## 2016-03-04 DIAGNOSIS — O09212 Supervision of pregnancy with history of pre-term labor, second trimester: Secondary | ICD-10-CM

## 2016-03-04 DIAGNOSIS — O09892 Supervision of other high risk pregnancies, second trimester: Secondary | ICD-10-CM

## 2016-03-04 DIAGNOSIS — Z3A20 20 weeks gestation of pregnancy: Secondary | ICD-10-CM | POA: Diagnosis not present

## 2016-03-04 MED ORDER — HYDROXYPROGESTERONE CAPROATE 250 MG/ML IM OIL
250.0000 mg | TOPICAL_OIL | Freq: Once | INTRAMUSCULAR | Status: AC
  Administered 2016-03-04: 250 mg via INTRAMUSCULAR

## 2016-03-04 NOTE — Progress Notes (Signed)
Pt here for 17P. Pt tolerated shot well. Return in 1 week for 17P. JSY

## 2016-03-11 ENCOUNTER — Other Ambulatory Visit: Payer: Self-pay | Admitting: Obstetrics and Gynecology

## 2016-03-11 ENCOUNTER — Other Ambulatory Visit: Payer: Medicaid Other

## 2016-03-11 ENCOUNTER — Other Ambulatory Visit (INDEPENDENT_AMBULATORY_CARE_PROVIDER_SITE_OTHER): Payer: Medicaid Other

## 2016-03-11 ENCOUNTER — Encounter: Payer: Self-pay | Admitting: Obstetrics and Gynecology

## 2016-03-11 ENCOUNTER — Ambulatory Visit: Payer: Medicaid Other

## 2016-03-11 ENCOUNTER — Ambulatory Visit (INDEPENDENT_AMBULATORY_CARE_PROVIDER_SITE_OTHER): Payer: Medicaid Other | Admitting: Obstetrics and Gynecology

## 2016-03-11 VITALS — BP 126/68 | HR 72 | Wt 116.4 lb

## 2016-03-11 DIAGNOSIS — Z363 Encounter for antenatal screening for malformations: Secondary | ICD-10-CM

## 2016-03-11 DIAGNOSIS — Z3482 Encounter for supervision of other normal pregnancy, second trimester: Secondary | ICD-10-CM

## 2016-03-11 DIAGNOSIS — Z3A21 21 weeks gestation of pregnancy: Secondary | ICD-10-CM

## 2016-03-11 DIAGNOSIS — Z1389 Encounter for screening for other disorder: Secondary | ICD-10-CM

## 2016-03-11 DIAGNOSIS — O09212 Supervision of pregnancy with history of pre-term labor, second trimester: Secondary | ICD-10-CM | POA: Diagnosis not present

## 2016-03-11 DIAGNOSIS — O99332 Smoking (tobacco) complicating pregnancy, second trimester: Secondary | ICD-10-CM

## 2016-03-11 DIAGNOSIS — Z331 Pregnant state, incidental: Secondary | ICD-10-CM | POA: Diagnosis not present

## 2016-03-11 DIAGNOSIS — O321XX1 Maternal care for breech presentation, fetus 1: Secondary | ICD-10-CM

## 2016-03-11 DIAGNOSIS — Z8751 Personal history of pre-term labor: Secondary | ICD-10-CM

## 2016-03-11 DIAGNOSIS — O99342 Other mental disorders complicating pregnancy, second trimester: Secondary | ICD-10-CM

## 2016-03-11 LAB — POCT URINALYSIS DIPSTICK
Blood, UA: NEGATIVE
GLUCOSE UA: NEGATIVE
Ketones, UA: NEGATIVE
Leukocytes, UA: NEGATIVE
NITRITE UA: NEGATIVE
Protein, UA: NEGATIVE

## 2016-03-11 MED ORDER — HYDROXYPROGESTERONE CAPROATE 250 MG/ML IM OIL
250.0000 mg | TOPICAL_OIL | Freq: Once | INTRAMUSCULAR | Status: AC
  Administered 2016-03-11: 250 mg via INTRAMUSCULAR

## 2016-03-11 NOTE — Progress Notes (Signed)
[place] 20+6 wks,breech,ant fundal pl gr 0, cx 4.3 cm,svp of fluid 5.6 cm,normal right ovary,left ovary not visualized,fhr 140 bpm,efw 346 g,anatomy complete,no obvious abnormalities seen

## 2016-03-11 NOTE — Progress Notes (Signed)
Kristin Simmons is a 31 y.o. female   High Risk Pregnancy HROB Diagnosis(es):   H/o PTB x 2 (28 & 24w)  [id number] [redacted]w[redacted]d Estimated Date of Delivery: 07/23/16    HPI: The patient is being seen today for ongoing management of the above. No pt complaints at this time. Pt notes desire for tubal postpartum   BP weight and urine results reviewed and noted. Blood pressure 126/68, pulse 72, weight 116 lb 6.4 oz (52.8 kg), last menstrual period 10/27/2015.   Urinalysis:NEGATIVE   Lab and sonogram results have been reviewed. Comments: normal; Review of anatomy scan shows nml female infant    Assessment:  1.  Pregnancy at [redacted]w[redacted]d,  [id number]   :  Estimated Date of Delivery: 07/23/16                         2.  H/o PTB x 2 (28 & 24w) dubious history  Medication(s) Plans:  Continue 17P  Treatment Plan:  Follow up in 4 weeks for appointment for high risk OB care  By signing my name below, I, Evelene Croon, attest that this documentation has been prepared under the direction and in the presence of Jonnie Kind, MD . Electronically Signed: Evelene Croon, Scribe. 03/11/2016. 1:49 PM. I personally performed the services described in this documentation, which was SCRIBED in my presence. The recorded information has been reviewed and considered accurate. It has been edited as necessary during review. Jonnie Kind, MD

## 2016-03-11 NOTE — Progress Notes (Signed)
17P given in left ventrogluteal with no complications. Pt to return next week for next injection.

## 2016-03-18 ENCOUNTER — Ambulatory Visit: Payer: Medicaid Other

## 2016-03-28 ENCOUNTER — Encounter: Payer: Medicaid Other | Admitting: Women's Health

## 2016-03-29 ENCOUNTER — Encounter: Payer: Medicaid Other | Admitting: Obstetrics & Gynecology

## 2016-04-01 ENCOUNTER — Ambulatory Visit (INDEPENDENT_AMBULATORY_CARE_PROVIDER_SITE_OTHER): Payer: Medicaid Other | Admitting: *Deleted

## 2016-04-01 DIAGNOSIS — O09212 Supervision of pregnancy with history of pre-term labor, second trimester: Secondary | ICD-10-CM | POA: Diagnosis not present

## 2016-04-01 DIAGNOSIS — O09899 Supervision of other high risk pregnancies, unspecified trimester: Secondary | ICD-10-CM

## 2016-04-01 DIAGNOSIS — Z8751 Personal history of pre-term labor: Secondary | ICD-10-CM

## 2016-04-01 DIAGNOSIS — O09219 Supervision of pregnancy with history of pre-term labor, unspecified trimester: Secondary | ICD-10-CM

## 2016-04-01 MED ORDER — HYDROXYPROGESTERONE CAPROATE 250 MG/ML IM OIL
250.0000 mg | TOPICAL_OIL | Freq: Once | INTRAMUSCULAR | Status: AC
  Administered 2016-04-01: 250 mg via INTRAMUSCULAR

## 2016-04-08 ENCOUNTER — Encounter: Payer: Self-pay | Admitting: *Deleted

## 2016-04-08 ENCOUNTER — Ambulatory Visit (INDEPENDENT_AMBULATORY_CARE_PROVIDER_SITE_OTHER): Payer: Medicaid Other | Admitting: *Deleted

## 2016-04-08 VITALS — BP 102/60 | HR 76 | Wt 116.0 lb

## 2016-04-08 DIAGNOSIS — Z1389 Encounter for screening for other disorder: Secondary | ICD-10-CM

## 2016-04-08 DIAGNOSIS — O09212 Supervision of pregnancy with history of pre-term labor, second trimester: Secondary | ICD-10-CM | POA: Diagnosis not present

## 2016-04-08 DIAGNOSIS — Z8751 Personal history of pre-term labor: Secondary | ICD-10-CM

## 2016-04-08 DIAGNOSIS — Z331 Pregnant state, incidental: Secondary | ICD-10-CM

## 2016-04-08 MED ORDER — HYDROXYPROGESTERONE CAPROATE 250 MG/ML IM OIL
250.0000 mg | TOPICAL_OIL | Freq: Once | INTRAMUSCULAR | Status: AC
  Administered 2016-04-08: 250 mg via INTRAMUSCULAR

## 2016-04-11 ENCOUNTER — Inpatient Hospital Stay (HOSPITAL_COMMUNITY)
Admission: AD | Admit: 2016-04-11 | Discharge: 2016-04-13 | DRG: 775 | Disposition: A | Discharge status: Home / Self Care | Payer: Medicaid Other | Source: Ambulatory Visit | Attending: Obstetrics and Gynecology | Admitting: Obstetrics and Gynecology

## 2016-04-11 ENCOUNTER — Encounter (HOSPITAL_COMMUNITY): Payer: Self-pay | Admitting: *Deleted

## 2016-04-11 ENCOUNTER — Encounter: Payer: Medicaid Other | Admitting: Women's Health

## 2016-04-11 ENCOUNTER — Telehealth: Payer: Self-pay | Admitting: *Deleted

## 2016-04-11 DIAGNOSIS — Z3A25 25 weeks gestation of pregnancy: Secondary | ICD-10-CM

## 2016-04-11 DIAGNOSIS — F1210 Cannabis abuse, uncomplicated: Secondary | ICD-10-CM | POA: Diagnosis present

## 2016-04-11 DIAGNOSIS — O99314 Alcohol use complicating childbirth: Secondary | ICD-10-CM | POA: Diagnosis present

## 2016-04-11 DIAGNOSIS — Z91040 Latex allergy status: Secondary | ICD-10-CM | POA: Diagnosis not present

## 2016-04-11 DIAGNOSIS — F1410 Cocaine abuse, uncomplicated: Secondary | ICD-10-CM | POA: Diagnosis not present

## 2016-04-11 DIAGNOSIS — Z87891 Personal history of nicotine dependence: Secondary | ICD-10-CM

## 2016-04-11 DIAGNOSIS — O99324 Drug use complicating childbirth: Secondary | ICD-10-CM | POA: Diagnosis not present

## 2016-04-11 DIAGNOSIS — Z3042 Encounter for surveillance of injectable contraceptive: Secondary | ICD-10-CM

## 2016-04-11 LAB — CBC
HCT: 30.9 % — ABNORMAL LOW (ref 36.0–46.0)
HEMOGLOBIN: 11 g/dL — AB (ref 12.0–15.0)
MCH: 30.5 pg (ref 26.0–34.0)
MCHC: 35.6 g/dL (ref 30.0–36.0)
MCV: 85.6 fL (ref 78.0–100.0)
PLATELETS: 263 10*3/uL (ref 150–400)
RBC: 3.61 MIL/uL — AB (ref 3.87–5.11)
RDW: 13.9 % (ref 11.5–15.5)
WBC: 10.8 10*3/uL — ABNORMAL HIGH (ref 4.0–10.5)

## 2016-04-11 LAB — RAPID URINE DRUG SCREEN, HOSP PERFORMED
AMPHETAMINES: NOT DETECTED
BARBITURATES: NOT DETECTED
Benzodiazepines: NOT DETECTED
Cocaine: NOT DETECTED
OPIATES: NOT DETECTED
TETRAHYDROCANNABINOL: NOT DETECTED

## 2016-04-11 LAB — TYPE AND SCREEN
ABO/RH(D): O POS
ANTIBODY SCREEN: NEGATIVE

## 2016-04-11 LAB — ABO/RH: ABO/RH(D): O POS

## 2016-04-11 MED ORDER — OXYCODONE-ACETAMINOPHEN 5-325 MG PO TABS: 1.0000 | ORAL_TABLET | ORAL | Status: DC | PRN

## 2016-04-11 MED ORDER — PHENYLEPHRINE 40 MCG/ML (10ML) SYRINGE FOR IV PUSH (FOR BLOOD PRESSURE SUPPORT)
80.0000 ug | PREFILLED_SYRINGE | INTRAVENOUS | Status: DC | PRN
  Filled 2016-04-11: qty 5

## 2016-04-11 MED ORDER — LACTATED RINGERS IV SOLN: 500.0000 mL | INTRAVENOUS | Status: DC | PRN

## 2016-04-11 MED ORDER — MAGNESIUM SULFATE BOLUS VIA INFUSION
4.0000 g | Freq: Once | INTRAVENOUS | Status: DC
  Filled 2016-04-11: qty 500

## 2016-04-11 MED ORDER — LIDOCAINE HCL (PF) 1 % IJ SOLN
30.0000 mL | INTRAMUSCULAR | Status: DC | PRN
  Filled 2016-04-11: qty 30

## 2016-04-11 MED ORDER — DIPHENHYDRAMINE HCL 50 MG/ML IJ SOLN: 12.5000 mg | INTRAMUSCULAR | Status: DC | PRN

## 2016-04-11 MED ORDER — SOD CITRATE-CITRIC ACID 500-334 MG/5ML PO SOLN: 30.0000 mL | ORAL | Status: DC | PRN

## 2016-04-11 MED ORDER — EPHEDRINE 5 MG/ML INJ
10.0000 mg | INTRAVENOUS | Status: DC | PRN
  Filled 2016-04-11: qty 4

## 2016-04-11 MED ORDER — OXYCODONE-ACETAMINOPHEN 5-325 MG PO TABS
2.0000 | ORAL_TABLET | ORAL | Status: DC | PRN
  Administered 2016-04-11: 2 via ORAL
  Filled 2016-04-11: qty 2

## 2016-04-11 MED ORDER — MAGNESIUM SULFATE 50 % IJ SOLN: 6.0000 g | Freq: Once | INTRAMUSCULAR | Status: DC

## 2016-04-11 MED ORDER — MAGNESIUM SULFATE BOLUS VIA INFUSION
6.0000 g | Freq: Once | INTRAVENOUS | Status: AC
  Administered 2016-04-11: 6 g via INTRAVENOUS
  Filled 2016-04-11: qty 500

## 2016-04-11 MED ORDER — FLEET ENEMA 7-19 GM/118ML RE ENEM: 1.0000 | ENEMA | RECTAL | Status: DC | PRN

## 2016-04-11 MED ORDER — ONDANSETRON HCL 4 MG/2ML IJ SOLN: 4.0000 mg | Freq: Four times a day (QID) | INTRAMUSCULAR | Status: DC | PRN

## 2016-04-11 MED ORDER — PHENYLEPHRINE 40 MCG/ML (10ML) SYRINGE FOR IV PUSH (FOR BLOOD PRESSURE SUPPORT)
PREFILLED_SYRINGE | INTRAVENOUS | Status: AC
  Filled 2016-04-11: qty 20

## 2016-04-11 MED ORDER — IBUPROFEN 600 MG PO TABS
600.0000 mg | ORAL_TABLET | Freq: Four times a day (QID) | ORAL | Status: DC
  Administered 2016-04-11 – 2016-04-13 (×7): 600 mg via ORAL
  Filled 2016-04-11 (×7): qty 1

## 2016-04-11 MED ORDER — OXYTOCIN BOLUS FROM INFUSION
500.0000 mL | Freq: Once | INTRAVENOUS | Status: AC
  Administered 2016-04-11: 500 mL via INTRAVENOUS

## 2016-04-11 MED ORDER — AMPICILLIN SODIUM 2 G IJ SOLR
2.0000 g | Freq: Once | INTRAMUSCULAR | Status: AC
  Administered 2016-04-11: 2 g via INTRAVENOUS
  Filled 2016-04-11: qty 2000

## 2016-04-11 MED ORDER — FENTANYL CITRATE (PF) 100 MCG/2ML IJ SOLN
INTRAMUSCULAR | Status: AC
  Administered 2016-04-11: 100 ug
  Filled 2016-04-11: qty 2

## 2016-04-11 MED ORDER — LACTATED RINGERS IV SOLN: 500.0000 mL | Freq: Once | INTRAVENOUS | Status: DC

## 2016-04-11 MED ORDER — BETAMETHASONE SOD PHOS & ACET 6 (3-3) MG/ML IJ SUSP
12.0000 mg | Freq: Once | INTRAMUSCULAR | Status: AC
  Administered 2016-04-11: 12 mg via INTRAMUSCULAR
  Filled 2016-04-11: qty 2

## 2016-04-11 MED ORDER — LACTATED RINGERS IV SOLN
INTRAVENOUS | Status: DC
  Administered 2016-04-11: 19:00:00 via INTRAVENOUS

## 2016-04-11 MED ORDER — FENTANYL 2.5 MCG/ML BUPIVACAINE 1/10 % EPIDURAL INFUSION (WH - ANES): 14.0000 mL/h | INTRAMUSCULAR | Status: DC | PRN

## 2016-04-11 MED ORDER — MAGNESIUM SULFATE 50 % IJ SOLN
2.0000 g/h | INTRAVENOUS | Status: DC
  Administered 2016-04-11: 2 g/h via INTRAVENOUS
  Filled 2016-04-11: qty 80

## 2016-04-11 MED ORDER — MAGNESIUM SULFATE 50 % IJ SOLN: 2.0000 g/h | INTRAVENOUS | Status: DC

## 2016-04-11 MED ORDER — OXYTOCIN 40 UNITS IN LACTATED RINGERS INFUSION - SIMPLE MED
2.5000 [IU]/h | INTRAVENOUS | Status: DC
  Filled 2016-04-11: qty 1000

## 2016-04-11 MED ORDER — FENTANYL CITRATE (PF) 100 MCG/2ML IJ SOLN: 100.0000 ug | Freq: Once | INTRAMUSCULAR | Status: DC

## 2016-04-11 MED ORDER — ACETAMINOPHEN 325 MG PO TABS
650.0000 mg | ORAL_TABLET | ORAL | Status: DC | PRN
  Administered 2016-04-12: 650 mg via ORAL
  Filled 2016-04-11: qty 2

## 2016-04-11 MED ORDER — FENTANYL 2.5 MCG/ML BUPIVACAINE 1/10 % EPIDURAL INFUSION (WH - ANES)
INTRAMUSCULAR | Status: DC
Start: 2016-04-11 — End: 2016-04-11
  Filled 2016-04-11: qty 100

## 2016-04-11 NOTE — H&P (Signed)
LABOR AND DELIVERY ADMISSION HISTORY AND PHYSICAL NOTE  Kristin Simmons is a 31 y.o. female [phone] with IUP at [redacted]w[redacted]d by 8 wk [place] presenting for preterm labor.   Patient states this morning she started to have some contractions that progressed and became very intense this evening, came in by EMS. She had some light bright red blood per vagina this AM, when arrived in MAU the VB became more.   In MAU noted to by complete with bulging bag and vertex presentation by bedside US.   She reports positive fetal movement. She denies leakage of fluid.  Prenatal History/Complications:  Past Medical History: Past Medical History:  Diagnosis Date  . Anemia   . Anxiety   . Cancer (HCC)    cervical  . Depression   . Hypertension   . Vaginal Pap smear, abnormal     Past Surgical History: No past surgical history on file.  Obstetrical History: OB History    Gravida Para Term Preterm AB Living   5 3 1 2 1 3    SAB TAB Ectopic Multiple Live Births   1       3      Social History: Social History   Social History  . Marital status: Single    Spouse name: N/A  . Number of children: N/A  . Years of education: N/A   Social History Main Topics  . Smoking status: Former Smoker    Packs/day: 0.50    Years: 15.00    Types: E-cigarettes  . Smokeless tobacco: Never Used     Comment: stopped ecig 3 days ago  . Alcohol use No  . Drug use: No  . Sexual activity: Yes    Birth control/ protection: None   Other Topics Concern  . Not on file   Social History Narrative  . No narrative on file    Family History: Family History  Problem Relation Age of Onset  . Alzheimer's disease Paternal Grandmother   . Cancer Maternal Grandmother   . Cancer Maternal Grandfather   . Hypertension Father   . Anemia Mother   . Hypertension Mother   . Thyroid disease Mother   . Diabetes Sister   . Hypertension Sister   . Mental illness Brother   . Asthma Daughter   . Bronchitis Daughter   . Asthma  Daughter   . Bronchitis Daughter   . Asthma Daughter   . Bronchitis Daughter     Allergies: Allergies  Allergen Reactions  . Latex Itching and Rash    Prescriptions Prior to Admission  Medication Sig Dispense Refill Last Dose  . Doxylamine-Pyridoxine (DICLEGIS) 10-10 MG TBEC 2 tabs q hs, if sx persist add 1 tab q am on day 3, if sx persist add 1 tab q afternoon on day 4 (Patient not taking: Reported on 03/11/2016) 100 tablet 6 Not Taking  . escitalopram (LEXAPRO) 20 MG tablet Take 1 tablet (20 mg total) by mouth daily. (Patient not taking: Reported on 03/11/2016) 30 tablet 6 Not Taking  . prenatal vitamin w/FE, FA (PRENATAL 1 + 1) 27-1 MG TABS tablet Take 1 tablet by mouth daily at 12 noon. 30 each 11 Taking     Review of Systems   All systems reviewed and negative except as stated in HPI  Last menstrual period 10/27/2015. General appearance: alert, cooperative, appears stated age and moderate distress Lungs: clear to auscultation bilaterally Heart: regular rate and rhythm Abdomen: soft, non-tender; bowel sounds normal Extremities: No calf swelling  or tenderness Presentation: cephalic by bedside US Fetal monitoring: 155, min to mod var, no accels no decels Uterine activity: Frequent contractions on admission Dilation: 10 Exam by:: Altamease Oiler NP   Prenatal labs: ABO, Rh: O/Positive/-- (11/29 1525) Antibody: Negative (11/29 1525) Rubella: !Error! IMMUNE RPR: Non Reactive (11/29 1525)  HBsAg: Negative (11/29 1525)  HIV: Non Reactive (11/29 1525)  GBS:   Unknown 1 hr Glucola: Unknown (too early) Genetic screening:  declined Anatomy US: Normal, female  Prenatal Transfer Tool  Maternal Diabetes: No Genetic Screening: Declined Maternal Ultrasounds/Referrals: Normal Fetal Ultrasounds or other Referrals:  None Maternal Substance Abuse:  Yes:  Type: Smoker, Marijuana, Cocaine, Other: Alcohol Significant Maternal Medications:  Meds include: Other:  Makena; Lexapro Significant  Maternal Lab Results: Lab values include: Other:  GBS unknown  No results found for this or any previous visit (from the past 24 hour(s)).  Patient Active Problem List   Diagnosis Date Noted  . Preterm delivery 04/11/2016  . Abnormal Pap smear of cervix 01/18/2016  . Supervision of normal pregnancy 01/13/2016  . Smoker 01/13/2016  . Depression 01/13/2016  . Alcohol use affecting pregnancy, first trimester 01/13/2016  . History of preterm delivery, currently pregnant 01/13/2016  . Underweight 01/13/2016    Assessment: Kristin Simmons is a 31 y.o. [phone] at [redacted]w[redacted]d here for precipitous/imminent preterm labor.   #Labor: Preterm, intact; 6g Mag bolus followed by 2g per hour for CP prophylaxis and tocolysis; BMZ given, repeat in 24 hours #Pain:  Nothing for right now #FWB:  Cat II (25 week tracing, normal) #ID:   GBS unknown - Ampicillin for GBS prophylaxis #MOF:  Unsure - will address later #MOC: Unsure - will address later #Circ:  N/A - girl   Katherine Basset, DO [place] for [facility], [facility] 04/11/2016, 6:59 PM

## 2016-04-11 NOTE — OB Triage Provider Note (Signed)
I was called to the room STAT by RN stating the patient was feeling pelvic pressure and bright red vaginal blood was noted on the bed  Patient appeared in pain.  Speculum done:  Vagina - membranes bulging.  Bimanual exam: Dilation: 10 Dilation Complete Date: 04/11/16 Dilation Complete Time: 1830 Presentation: Vertex Exam by:: J.  NP Chaperone present for exam.   + heart tones  Delivery is imminent  Patient rushed to birthing suits. Dr. Vanetta Shawl called, NICU notified. Betamethasone ordered  Lezlie Lye, NP 04/11/2016 10:37 PM

## 2016-04-11 NOTE — MAU Note (Signed)
1827, pt arrived by EMS, c/o contractions and bleeding.  Rasch NP to bedside. Pt has hx of PTD x2

## 2016-04-11 NOTE — Progress Notes (Addendum)
Assumed care of pt at this time. Pt texting. No complaints at this time. Denies rectal pressure. Will cont to monitor.   1950: Nitrous oxide initiated. Pt breathing thru ctx.   1958: pt cont to breathe nitrous oxide thru ctx.   2007: anesthesia notified. Report status of pt given. In another room placing epidural.  2027: pt cont to breath nitrous oxide during ctx. Awaiting anesthesia.   2033: pt c/o increasing pressure 2034: dr. Vanetta Shawl at bs.   2048: birth of viable female. NICU team called.  2050: NICU team at bs.   2057: 154mcg fentanyl admin per MD order  2101: delivery of  placenta  2217: notified High risk ob.   2218: pt to 3rd floor via wheelchair.
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
## 2016-04-12 ENCOUNTER — Encounter (HOSPITAL_COMMUNITY): Payer: Self-pay

## 2016-04-12 LAB — RPR: RPR Ser Ql: NONREACTIVE

## 2016-04-12 MED ORDER — SENNOSIDES-DOCUSATE SODIUM 8.6-50 MG PO TABS
2.0000 | ORAL_TABLET | ORAL | Status: DC
  Administered 2016-04-12 – 2016-04-13 (×2): 2 via ORAL
  Filled 2016-04-12 (×2): qty 2

## 2016-04-12 MED ORDER — SODIUM CHLORIDE 0.9 % IV SOLN: 250.0000 mL | INTRAVENOUS | Status: DC | PRN

## 2016-04-12 MED ORDER — OXYTOCIN 40 UNITS IN LACTATED RINGERS INFUSION - SIMPLE MED: 2.5000 [IU]/h | INTRAVENOUS | Status: DC | PRN

## 2016-04-12 MED ORDER — SIMETHICONE 80 MG PO CHEW
80.0000 mg | CHEWABLE_TABLET | ORAL | Status: DC | PRN
  Administered 2016-04-12: 80 mg via ORAL
  Filled 2016-04-12: qty 1

## 2016-04-12 MED ORDER — BENZOCAINE-MENTHOL 20-0.5 % EX AERO: 1.0000 "application " | INHALATION_SPRAY | CUTANEOUS | Status: DC | PRN

## 2016-04-12 MED ORDER — ACETAMINOPHEN 325 MG PO TABS
650.0000 mg | ORAL_TABLET | ORAL | Status: DC | PRN
  Administered 2016-04-12: 650 mg via ORAL
  Filled 2016-04-12: qty 2

## 2016-04-12 MED ORDER — COCONUT OIL OIL
1.0000 "application " | TOPICAL_OIL | Status: DC | PRN
  Administered 2016-04-13: 1 via TOPICAL
  Filled 2016-04-12: qty 120

## 2016-04-12 MED ORDER — FAMOTIDINE 20 MG PO TABS
20.0000 mg | ORAL_TABLET | Freq: Two times a day (BID) | ORAL | Status: DC
  Administered 2016-04-12 – 2016-04-13 (×2): 20 mg via ORAL
  Filled 2016-04-12 (×2): qty 1

## 2016-04-12 MED ORDER — GI COCKTAIL ~~LOC~~
30.0000 mL | Freq: Once | ORAL | Status: AC
  Administered 2016-04-12: 30 mL via ORAL
  Filled 2016-04-12: qty 30

## 2016-04-12 MED ORDER — ONDANSETRON HCL 4 MG PO TABS: 4.0000 mg | ORAL_TABLET | ORAL | Status: DC | PRN

## 2016-04-12 MED ORDER — DIPHENHYDRAMINE HCL 25 MG PO CAPS: 25.0000 mg | ORAL_CAPSULE | Freq: Four times a day (QID) | ORAL | Status: DC | PRN

## 2016-04-12 MED ORDER — SODIUM CHLORIDE 0.9% FLUSH: 3.0000 mL | INTRAVENOUS | Status: DC | PRN

## 2016-04-12 MED ORDER — ESCITALOPRAM OXALATE 20 MG PO TABS
20.0000 mg | ORAL_TABLET | Freq: Every day | ORAL | Status: DC
  Administered 2016-04-12 – 2016-04-13 (×2): 20 mg via ORAL
  Filled 2016-04-12 (×3): qty 1

## 2016-04-12 MED ORDER — OXYCODONE-ACETAMINOPHEN 5-325 MG PO TABS
1.0000 | ORAL_TABLET | Freq: Four times a day (QID) | ORAL | Status: DC | PRN
  Administered 2016-04-12: 1 via ORAL
  Filled 2016-04-12: qty 1

## 2016-04-12 MED ORDER — DIBUCAINE 1 % RE OINT: 1.0000 "application " | TOPICAL_OINTMENT | RECTAL | Status: DC | PRN

## 2016-04-12 MED ORDER — ZOLPIDEM TARTRATE 5 MG PO TABS
5.0000 mg | ORAL_TABLET | Freq: Every evening | ORAL | Status: DC | PRN
  Administered 2016-04-12: 5 mg via ORAL
  Filled 2016-04-12: qty 1

## 2016-04-12 MED ORDER — ONDANSETRON HCL 4 MG/2ML IJ SOLN: 4.0000 mg | INTRAMUSCULAR | Status: DC | PRN

## 2016-04-12 MED ORDER — SODIUM CHLORIDE 0.9% FLUSH: 3.0000 mL | Freq: Two times a day (BID) | INTRAVENOUS | Status: DC

## 2016-04-12 MED ORDER — PRENATAL MULTIVITAMIN CH
1.0000 | ORAL_TABLET | Freq: Every day | ORAL | Status: DC
  Administered 2016-04-12: 1 via ORAL
  Filled 2016-04-12: qty 1

## 2016-04-12 MED ORDER — WITCH HAZEL-GLYCERIN EX PADS: 1.0000 "application " | MEDICATED_PAD | CUTANEOUS | Status: DC | PRN

## 2016-04-12 MED ORDER — TETANUS-DIPHTH-ACELL PERTUSSIS 5-2.5-18.5 LF-MCG/0.5 IM SUSP: 0.5000 mL | Freq: Once | INTRAMUSCULAR | Status: DC
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
## 2016-04-12 NOTE — Lactation Note (Signed)
This note was copied from a baby's chart. Lactation Consultation Note  Patient Name: Kristin Simmons [id number] Date: 04/12/2016 Reason for consult: Initial assessment;NICU baby Breastfeeding consultation services and support information given to patient.  Providing Breastmilk For Your Baby in NICU booklet also given.  Mom states she would like to pump so her breasts get bigger.  Discussed the importance of breast milk for early baby.  Instructed on supply and demand.  Instructed to pump 8-12 times/24 hours.  Explained to mom she may not obtain much the first few days.  Taught hand expression to follow pumping.  Mom has talked to [facility] and arranged to pick up a pump after discharge.  Encouraged to call for assist/cocnerns.  Maternal Data Has patient been taught Hand Expression?: Yes  Feeding    LATCH Score/Interventions                      Lactation Tools Discussed/Used WIC Program: Yes Pump Review: Setup, frequency, and cleaning;Milk Storage Initiated by:: [place] Date initiated:: 04/12/16   Consult Status Consult Status: Follow-up Date: 04/13/16 Follow-up type: In-patient    Ave Filter 04/12/2016, 11:34 AM

## 2016-04-12 NOTE — Progress Notes (Signed)
Post Partum Day 1 Subjective: complains of chest discomfort and SOB. No diaphoresis, radiation of chest discomfort, nausea. No pleurisy  Objective: Blood pressure (!) 112/97, pulse 63, temperature 97.8 F (36.6 C), temperature source Oral, resp. rate 18, last menstrual period 10/27/2015, SpO2 100 %.  Physical Exam:  General: alert, cooperative and no distress. Pt comfortable in bed. Lochia: appropriate Uterine Fundus: firm DVT Evaluation: No evidence of DVT seen on physical exam. Negative Homan's sign.   Recent Labs  04/11/16 1840  HGB 11.0*  HCT 30.9*    Assessment/Plan: GI cocktail - if not effective, will get CXR and labs, although pt very comfortable.   LOS: 1 day   Truett Mainland 04/12/2016, 10:05 AM

## 2016-04-12 NOTE — Progress Notes (Signed)
Pt sleeping when I attempted to visit.  Will attempt another visit tomorrow or follow up in NICU.  [place], [place] Pager, [phone] 3:01 PM    04/12/16 1500  Clinical Encounter Type  Visited With Patient not available

## 2016-04-13 ENCOUNTER — Encounter: Payer: Medicaid Other | Admitting: Obstetrics and Gynecology

## 2016-04-13 DIAGNOSIS — Z3042 Encounter for surveillance of injectable contraceptive: Secondary | ICD-10-CM

## 2016-04-13 MED ORDER — DOCUSATE SODIUM 100 MG PO CAPS: 100.0000 mg | ORAL_CAPSULE | Freq: Two times a day (BID) | ORAL | 0 refills | Status: DC

## 2016-04-13 MED ORDER — IBUPROFEN 600 MG PO TABS: 600.0000 mg | ORAL_TABLET | Freq: Four times a day (QID) | ORAL | 0 refills | Status: DC

## 2016-04-13 MED ORDER — MEDROXYPROGESTERONE ACETATE 150 MG/ML IM SUSP
150.0000 mg | Freq: Once | INTRAMUSCULAR | Status: AC
  Administered 2016-04-13: 150 mg via INTRAMUSCULAR
  Filled 2016-04-13: qty 1

## 2016-04-13 NOTE — Lactation Note (Signed)
This note was copied from a baby's chart. Lactation Consultation Note  Patient Name: Kristin Simmons [id number] Date: 04/13/2016 Reason for consult: Follow-up assessment   With this mom of a NICU baby, now 1 hours old, and 25 4/7 weeks CGA, very small, , weight 1 lb 5.5 oz. I gave mom coconut ol to apply to her nipples prior to pumfotable fit. I also reviewed hand expession with mom. She had a steady flow of colostrum/transitional milk dripping into the bottles. Basic pumping teaching done with mom, and she has a [facility] appoinment for 3 pm today, at [facility], to get a DEP. Mom encouraged to eat when hungry, and stay hydrated. Mom will call for questions/conerns.    Maternal Data    Feeding    LATCH Score/Interventions                      Lactation Tools Discussed/Used WIC Program:  (mom has appointmetn today a 3 pm at [facility] for a DEP)   Consult Status Consult Status: PRN Follow-up type: In-patient (NICU)    [name] 04/13/2016, 12:35 PM

## 2016-04-13 NOTE — Discharge Summary (Signed)
OB Discharge Summary     Patient Name: Kristin Simmons DOB: 1985-04-03 MRN: [id number]  Date of admission: 04/11/2016 Delivering MD: Katherine Basset [facility]   Date of discharge: 04/13/2016  Admitting diagnosis: 26w ctx, bleeding Intrauterine pregnancy: [redacted]w[redacted]d     Secondary diagnosis:  Active Problems:   Preterm delivery   Encounter for management and injection of injectable progestin contraceptive  Additional problems: None     Discharge diagnosis: Preterm Pregnancy Delivered                                                                                                Post partum procedures:Birth control given  Augmentation: None, spontaneous vaginal delivery, en caul.  Complications: None  Hospital course:  Onset of Labor With Vaginal Delivery     31 y.o. yo [id number] at [redacted]w[redacted]d was admitted in Active Labor on 04/11/2016. Patient had an uncomplicated precipitous preterm labor course as follows:  Membrane Rupture Time/Date: 8:48 PM ,04/11/2016   Intrapartum Procedures: Episiotomy: None [1]                                         Lacerations:  None [1]  Patient had a delivery of a Viable infant en caul. 04/11/2016  Information for the patient's newborn:  Miel, Doung [id number]  Delivery Method: Vaginal, Spontaneous Delivery (Filed from Delivery Summary)    Pateint had an uncomplicated postpartum course.  She is ambulating, tolerating a regular diet, passing flatus, and urinating well. Social work has seen the patient, declines any barrier to care. Patient is discharged home in stable condition on 04/13/16.   Physical exam  Vitals:   04/12/16 0827 04/12/16 1040 04/12/16 1616 04/13/16 0800  BP: (!) 112/97 117/70 133/84 120/75  Pulse: 63 69 62 (!) 55  Resp: 18 18 18 18   Temp: 97.8 F (36.6 C) 98.7 F (37.1 C)  98.4 F (36.9 C)  TempSrc: Oral Oral  Oral  SpO2: 100% 100% 98%    General: alert, cooperative and no distress Lochia: appropriate Uterine Fundus:  firm Incision: N/A DVT Evaluation: No evidence of DVT seen on physical exam. Negative Homan's sign. No cords or calf tenderness. Labs: Lab Results  Component Value Date   WBC 10.8 (H) 04/11/2016   HGB 11.0 (L) 04/11/2016   HCT 30.9 (L) 04/11/2016   MCV 85.6 04/11/2016   PLT 263 04/11/2016   CMP Latest Ref Rng & Units 07/22/2015  Glucose 65 - 99 mg/dL 82  BUN 6 - 20 mg/dL 10  Creatinine 0.44 - 1.00 mg/dL 0.89  Sodium 135 - 145 mmol/L 138  Potassium 3.5 - 5.1 mmol/L 4.3  Chloride 101 - 111 mmol/L 106  CO2 22 - 32 mmol/L 27  Calcium 8.9 - 10.3 mg/dL 8.7(L)  Total Protein 6.5 - 8.1 g/dL -  Total Bilirubin 0.3 - 1.2 mg/dL -  Alkaline Phos 38 - 126 U/L -  AST 15 - 41 U/L -  ALT 14 - 54 U/L -    Discharge  instruction: per After Visit Summary and "Baby and Me Booklet".  After visit meds:  Allergies as of 04/13/2016      Reactions   Latex Itching, Rash      Medication List    TAKE these medications   albuterol 108 (90 Base) MCG/ACT inhaler Commonly known as:  PROVENTIL HFA;VENTOLIN HFA Inhale 2 puffs into the lungs every 6 (six) hours as needed for wheezing or shortness of breath.   docusate sodium 100 MG capsule Commonly known as:  COLACE Take 1 capsule (100 mg total) by mouth 2 (two) times daily.   escitalopram 20 MG tablet Commonly known as:  LEXAPRO Take 1 tablet (20 mg total) by mouth daily.   ibuprofen 600 MG tablet Commonly known as:  ADVIL,MOTRIN Take 1 tablet (600 mg total) by mouth every 6 (six) hours.   prenatal vitamin w/FE, FA 27-1 MG Tabs tablet Take 1 tablet by mouth daily at 12 noon.       Diet: routine diet  Activity: Advance as tolerated. Pelvic rest for 6 weeks.   Outpatient follow up:6 weeks Follow up Appt:Future Appointments Date Time Provider [place]  04/13/2016 1:30 PM Jonnie Kind, MD [facility]   Follow up Visit:No Follow-up on file.  Postpartum contraception: Depo Provera - given in hospital prior to  discharge  Newborn Data: Live born female  Birth Weight: 1 lb 5.5 oz (610 g) APGAR: ,   Baby Feeding: Bottle and Breast Disposition:NICU - preterm at 16 weeks   04/13/2016 Katherine Basset, DO  OB Fellow

## 2016-04-13 NOTE — Progress Notes (Signed)
Pt  Ambulated out teaching complete  

## 2016-04-13 NOTE — Progress Notes (Signed)
Post Partum Day 2 Subjective: no complaints, up ad lib, voiding, tolerating PO and + flatus  Objective: Blood pressure 120/75, pulse (!) 55, temperature 98.4 F (36.9 C), temperature source Oral, resp. rate 18, last menstrual period 10/27/2015, SpO2 98 %.  Physical Exam:  General: alert, cooperative and no distress Lochia: appropriate Uterine Fundus: firm Incision: healing well DVT Evaluation: No evidence of DVT seen on physical exam.   Recent Labs  04/11/16 1840  HGB 11.0*  HCT 30.9*    Assessment/Plan: Social Work consult and Contraception Depo   LOS: 2 days   Clyde Hill Bing, DO, PGY-1 04/13/2016, 9:18 AM   OB FELLOW POSTPARTUM PROGRESS NOTE ATTESTATION  I have seen and examined this patient and agree with above documentation in the resident's note. Patient is discharged today, spoke with Education officer, museum, no barriers to discharge. Baby to stay in NICU until full term. Depo Provera to be given before discharge. See discharge summary.  Katherine Basset, DO OB Fellow 9:59 AM

## 2016-04-13 NOTE — Discharge Instructions (Signed)
Preterm Birth °Preterm birth is a birth that happens before 37 weeks of pregnancy. Most pregnancies last about 39-41 weeks. Every week in the womb is important and is beneficial to the health of the infant. Infants born before 37 weeks of pregnancy are at a higher risk for complications. Depending on when the infant was born, he or she may be: °· Late preterm. Born between 32 weeks and 37 weeks of pregnancy. °· Very preterm. Born at less than 32 weeks of pregnancy. °· Extremely preterm. Born at less than 25 weeks of pregnancy. °The earlier a baby is born, the more likely the child will have issues related to prematurity. Complications and problems that can be seen in infants born too early include: °· Problems breathing (respiratory distress syndrome). °· Low birth weight. °· Problems feeding. °· Sleeping problems. °· Yellowing of the skin (jaundice). °· Infections such as pneumonia. °Babies born very preterm or extremely preterm are at risk for more serious medical issues. These include: °· More severe breathing issues. °· Eyesight issues. °· Brain development issues (intraventricular hemorrhage). °· Behavioral and emotional development issues. °· Growth and developmental delays. °· Cerebral palsy. °· Serious feeding or bowel complications (necrotizing enterocolitis). °What are the causes? °There are two broad categories of preterm birth. °· Spontaneous preterm birth. This is a birth resulting from preterm labor (not medically induced) or preterm premature rupture of membranes (PPROM). °· Indicated preterm birth. This is a birth resulting from labor being medically induced due to health, personal, or social reasons. °What increases the risk? °Preterm birth may be related to certain medical conditions, lifestyle factors, or demographic factors encountered by the mother or fetus. °· Medical conditions include: °¨ Multiple gestations (twins, triplets, and so on). °¨ Infection. °¨ Diabetes. °¨ Heart disease. °¨ Kidney  disease. °¨ Cervical or uterine abnormalities. °¨ Being underweight. °¨ High blood pressure or preeclampsia. °¨ Premature rupture of membranes (PROM). °¨ Birth defects in the fetus. °· Lifestyle factors include: °¨ Poor prenatal care. °¨ Poor nutrition or anemia. °¨ Cigarette smoking. °¨ Consuming alcohol. °¨ High levels of stress and lack of social or emotional support. °¨ Exposure to chemical or environmental toxins. °¨ Substance abuse. °· Demographic factors include: °¨ African-American ethnicity. °¨ Age (younger than 18 or older than 29 years of age). °¨ Low socioeconomic status. °Women with a history of preterm labor or who become pregnant within 18 months of giving birth are also at increased risk for preterm birth. °How is this diagnosed? °Your health care provider may request additional tests to diagnose underlying complications resulting from preterm birth. Tests on the infant may include: °· Physical exam. °· Blood tests. °· Chest X-rays. °· Heart-lung monitoring. °How is this treated? °After birth, special care will be taken to assess any problems or complications for the infant. Supportive care will be provided for the infant. Treatment depends on what problems are present and any complications that develop. Some preterm infants are cared for in a neonatal intensive care unit. In general, care may include: °· Maintaining temperature and oxygen in a clear heated box (baby isolette). °· Monitoring the infant's heart rate, breathing, and level of oxygen in the blood. °· Monitoring for signs of infection and, if needed, giving IV antibiotic medicine. °· Inserting a feeding tube (nose, mouth) or giving IV nutrition if unable to feed. °· Inserting a breathing tube (ventilation). °· Respiration support (continuous positive airway pressure [CPAP] or oxygen). °Treatment will change as the infant builds up strength and is able   to breathe and eat on his or her own. For some infants, no special treatment is  necessary. Parents may be educated on the potential health risks of prematurity to the infant. Follow these instructions at home:  Understand your infant's special conditions and needs. It may be reassuring to learn about infant CPR.  Monitor your infant in the car seat until he or she grows and matures. Infant car seats can cause breathing difficulties for preterm infants.  Keep your infant warm. Dress your infant in layers and keep him or her away from drafts, especially in cold months of the year.  Wash your hands thoroughly after going to the bathroom or changing a diaper. Late preterm infants may be more prone to infection.  Follow all your health care provider's instructions for providing support and care to your preterm infant.  Get support from organizations and groups that understand your challenges.  Follow up with your infant's health care provider as directed. Prevention  There are some things you can do to help lower your risk of having a preterm infant in the future. These include:  Good prenatal care throughout the entire pregnancy. See a health care provider regularly for advice and tests.  Management of underlying medical conditions.  Proper self-care and lifestyle changes.  Proper diet and weight control.  Watching for signs of various infections. Where to find more information: March of Dimes: www.marchofdimes.com Prematurity.org: www.prematurity.org Contact a health care provider if:  Your infant has feeding difficulties.  Your infant has sleeping difficulties.  Your infant has breathing difficulties.  Your infant's skin starts to look yellow.  Your infant shows signs of infection, such as a stuffy nose, fever, crying, or bluish color of the skin. This information is not intended to replace advice given to you by your health care provider. Make sure you discuss any questions you have with your health care provider. Document Released: 04/23/2003 Document  Revised: 07/09/2015 Document Reviewed: 08/30/2012 Elsevier Interactive Patient Education  2017 [employer].   Preventing Preterm Birth Preterm birth is when your baby is delivered between 33 weeks and 23 weeks of pregnancy. A full-term pregnancy lasts for at least 37 weeks. Preterm birth can be dangerous for your baby because the last few weeks of pregnancy are an important time for your baby's brain and lungs to grow. Many things can cause a baby to be born early. Sometimes the cause is not known. There are certain factors that make you more likely to experience preterm birth, such as:  Having a previous baby born preterm.  Being pregnant with twins or other multiples.  Having had fertility treatment.  Being overweight or underweight at the start of your pregnancy.  Having any of the following during pregnancy:  An infection, including a urinary tract infection (UTI) or an STI (sexually transmitted infection).  High blood pressure.  Diabetes.  Vaginal bleeding.  Being age 31 or older.  Being age 2 or younger.  Getting pregnant within 6 months of a previous pregnancy.  Suffering extreme stress or physical or emotional abuse during pregnancy.  Standing for long periods of time during pregnancy, such as working at a job that requires standing. What are the risks? The most serious risk of preterm birth is that the baby may not survive. This is more likely to happen if a baby is born before 42 weeks. Other risks and complications of preterm birth may include your baby having:  Breathing problems.  Brain damage that affects movement and coordination (  cerebral palsy).  Feeding difficulties.  Vision or hearing problems.  Infections or inflammation of the digestive tract (colitis).  Developmental delays.  Learning disabilities.  Higher risk for diabetes, heart disease, and high blood pressure later in life. What can I do to lower my risk? Medical care  The most  important thing you can do to lower your risk for preterm birth is to get routine medical care during pregnancy (prenatal care). If you have a high risk of preterm birth, you may be referred to a health care provider who specializes in managing high-risk pregnancies (perinatologist). You may be given medicine to help prevent preterm birth. Lifestyle changes  Certain lifestyle changes can also lower your risk of preterm birth:  Wait at least 6 months after a pregnancy to become pregnant again.  Try to plan pregnancy for when you are between 6 and 80 years old.  Get to a healthy weight before getting pregnant. If you are overweight, work with your health care provider to safely lose weight.  Do not use any products that contain nicotine or tobacco, such as cigarettes and e-cigarettes. If you need help quitting, ask your health care provider.  Do not drink alcohol.  Do not use drugs. Where to find support: For more support, consider:  Talking with your health care provider.  Talking with a therapist or substance abuse counselor, if you need help quitting.  Working with a diet and nutrition specialist (dietitian) or a Physiological scientist to maintain a healthy weight.  Joining a support group. Where to find more information: Learn more about preventing preterm birth from:  Centers for Disease Control and Prevention: VoipObserver.com.br  March of Dimes: marchofdimes.org/complications/premature-babies.aspx  American Pregnancy Association: americanpregnancy.org/labor-and-birth/premature-labor Contact a health care provider if:  You have any of the following signs of preterm labor before 37 weeks:  A change or increase in vaginal discharge.  Fluid leaking from your vagina.  Pressure or cramps in your lower abdomen.  A backache that does not go away or gets worse.  Regular tightening (contractions) in your lower  abdomen. Summary  Preterm birth means having your baby during weeks 42-37 of pregnancy.  Preterm birth may put your baby at risk for physical and mental problems.  Getting good prenatal care can help prevent preterm birth.  You can lower your risk of preterm birth by making certain lifestyle changes, such as not smoking and not using alcohol. This information is not intended to replace advice given to you by your health care provider. Make sure you discuss any questions you have with your health care provider. Document Released: 03/17/2015 Document Revised: 10/10/2015 Document Reviewed: 10/10/2015 Elsevier Interactive Patient Education  2017 [place].   Vaginal Delivery, Care After Refer to this sheet in the next few weeks. These instructions provide you with information about caring for yourself after vaginal delivery. Your health care provider may also give you more specific instructions. Your treatment has been planned according to current medical practices, but problems sometimes occur. Call your health care provider if you have any problems or questions. What can I expect after the procedure? After vaginal delivery, it is common to have:  Some bleeding from your vagina.  Soreness in your abdomen, your vagina, and the area of skin between your vaginal opening and your anus (perineum).  Pelvic cramps.  Fatigue. Follow these instructions at home: Medicines   Take over-the-counter and prescription medicines only as told by your health care provider.  If you were prescribed an antibiotic medicine,  take it as told by your health care provider. Do not stop taking the antibiotic until it is finished. Driving    Do not drive or operate heavy machinery while taking prescription pain medicine.  Do not drive for 24 hours if you received a sedative. Lifestyle   Do not drink alcohol. This is especially important if you are breastfeeding or taking medicine to relieve pain.  Do  not use tobacco products, including cigarettes, chewing tobacco, or e-cigarettes. If you need help quitting, ask your health care provider. Eating and drinking   Drink at least 8 eight-ounce glasses of water every day unless you are told not to by your health care provider. If you choose to breastfeed your baby, you may need to drink more water than this.  Eat high-fiber foods every day. These foods may help prevent or relieve constipation. High-fiber foods include:  Whole grain cereals and breads.  Brown rice.  Beans.  Fresh fruits and vegetables. Activity   Return to your normal activities as told by your health care provider. Ask your health care provider what activities are safe for you.  Rest as much as possible. Try to rest or take a nap when your baby is sleeping.  Do not lift anything that is heavier than your baby or 10 lb (4.5 kg) until your health care provider says that it is safe.  Talk with your health care provider about when you can engage in sexual activity. This may depend on your:  Risk of infection.  Rate of healing.  Comfort and desire to engage in sexual activity. Vaginal Care   If you have an episiotomy or a vaginal tear, check the area every day for signs of infection. Check for:  More redness, swelling, or pain.  More fluid or blood.  Warmth.  Pus or a bad smell.  Do not use tampons or douches until your health care provider says this is safe.  Watch for any blood clots that may pass from your vagina. These may look like clumps of dark red, brown, or black discharge. General instructions   Keep your perineum clean and dry as told by your health care provider.  Wear loose, comfortable clothing.  Wipe from front to back when you use the toilet.  Ask your health care provider if you can shower or take a bath. If you had an episiotomy or a perineal tear during labor and delivery, your health care provider may tell you not to take baths for a  certain length of time.  Wear a bra that supports your breasts and fits you well.  If possible, have someone help you with household activities and help care for your baby for at least a few days after you leave the hospital.  Keep all follow-up visits for you and your baby as told by your health care provider. This is important. Contact a health care provider if:  You have:  Vaginal discharge that has a bad smell.  Difficulty urinating.  Pain when urinating.  A sudden increase or decrease in the frequency of your bowel movements.  More redness, swelling, or pain around your episiotomy or vaginal tear.  More fluid or blood coming from your episiotomy or vaginal tear.  Pus or a bad smell coming from your episiotomy or vaginal tear.  A fever.  A rash.  Little or no interest in activities you used to enjoy.  Questions about caring for yourself or your baby.  Your episiotomy or vaginal tear feels  warm to the touch.  Your episiotomy or vaginal tear is separating or does not appear to be healing.  Your breasts are painful, hard, or turn red.  You feel unusually sad or worried.  You feel nauseous or you vomit.  You pass large blood clots from your vagina. If you pass a blood clot from your vagina, save it to show to your health care provider. Do not flush blood clots down the toilet without having your health care provider look at them.  You urinate more than usual.  You are dizzy or light-headed.  You have not breastfed at all and you have not had a menstrual period for 12 weeks after delivery.  You have stopped breastfeeding and you have not had a menstrual period for 12 weeks after you stopped breastfeeding. Get help right away if:  You have:  Pain that does not go away or does not get better with medicine.  Chest pain.  Difficulty breathing.  Blurred vision or spots in your vision.  Thoughts about hurting yourself or your baby.  You develop pain in your  abdomen or in one of your legs.  You develop a severe headache.  You faint.  You bleed from your vagina so much that you fill two sanitary pads in one hour. This information is not intended to replace advice given to you by your health care provider. Make sure you discuss any questions you have with your health care provider. Document Released: 01/29/2000 Document Revised: 07/15/2015 Document Reviewed: 02/15/2015 Elsevier Interactive Patient Education  2017 [employer].

## 2016-04-19 NOTE — Addendum Note (Signed)
Addended by: Gaylyn Rong A on: 04/19/2016 04:01 PM   Modules accepted: Level of Service

## 2016-04-19 NOTE — Progress Notes (Signed)
Makena 250mg  given IM right ventrogluteal at [id number] with no complications.

## 2016-04-29 ENCOUNTER — Ambulatory Visit (INDEPENDENT_AMBULATORY_CARE_PROVIDER_SITE_OTHER): Payer: Medicaid Other | Admitting: Obstetrics and Gynecology

## 2016-04-29 ENCOUNTER — Encounter: Payer: Self-pay | Admitting: Obstetrics and Gynecology

## 2016-04-29 VITALS — BP 128/82 | HR 71 | Wt 107.6 lb

## 2016-04-29 DIAGNOSIS — R1084 Generalized abdominal pain: Secondary | ICD-10-CM

## 2016-04-29 DIAGNOSIS — N898 Other specified noninflammatory disorders of vagina: Secondary | ICD-10-CM | POA: Diagnosis not present

## 2016-04-29 DIAGNOSIS — B9689 Other specified bacterial agents as the cause of diseases classified elsewhere: Secondary | ICD-10-CM | POA: Diagnosis not present

## 2016-04-29 DIAGNOSIS — N760 Acute vaginitis: Secondary | ICD-10-CM | POA: Diagnosis not present

## 2016-04-29 DIAGNOSIS — Z118 Encounter for screening for other infectious and parasitic diseases: Secondary | ICD-10-CM

## 2016-04-29 DIAGNOSIS — Z1159 Encounter for screening for other viral diseases: Secondary | ICD-10-CM

## 2016-04-29 MED ORDER — METRONIDAZOLE 500 MG PO TABS: 500.0000 mg | ORAL_TABLET | Freq: Two times a day (BID) | ORAL | 1 refills | Status: DC

## 2016-04-29 NOTE — Patient Instructions (Signed)

## 2016-04-29 NOTE — Progress Notes (Signed)
°   [facility] Visit  04/29/2016          Patient name: Kristin Simmons MRN [id number]  Date of birth: [birth year]  CC & HPI:  Kristin Simmons is a 31 y.o. female presenting today for periumbilical/epigastric abdominal pain. No modifying factors noted. She is ~ 3 weeks postpartum and notes continued light vaginal bleeding that is improving. She has received a Depo shot for birth control. She has no other acute complaints at this time.   ROS:  ROS Otherwise negative for acute change except as noted in the HPI.  Pertinent History Reviewed:   Reviewed: Significant for cervical cancer Medical         Past Medical History:  Diagnosis Date   Anemia    Anxiety    Cancer ([place])    cervical   Depression    Hypertension    Vaginal Pap smear, abnormal                               Surgical Hx:    Past Surgical History:  Procedure Laterality Date   NO PAST SURGERIES     Medications: Reviewed & Updated - see associated section                       Current Outpatient Prescriptions:    albuterol (PROVENTIL HFA;VENTOLIN HFA) 108 (90 Base) MCG/ACT inhaler, Inhale 2 puffs into the lungs every 6 (six) hours as needed for wheezing or shortness of breath., Disp: , Rfl:    docusate sodium (COLACE) 100 MG capsule, Take 1 capsule (100 mg total) by mouth 2 (two) times daily., Disp: 30 capsule, Rfl: 0   escitalopram (LEXAPRO) 20 MG tablet, Take 1 tablet (20 mg total) by mouth daily., Disp: 30 tablet, Rfl: 6   ibuprofen (ADVIL,MOTRIN) 600 MG tablet, Take 1 tablet (600 mg total) by mouth every 6 (six) hours., Disp: 30 tablet, Rfl: 0   prenatal vitamin w/FE, FA (PRENATAL 1 + 1) 27-1 MG TABS tablet, Take 1 tablet by mouth daily at 12 noon., Disp: 30 each, Rfl: 11   Social History: Reviewed -  reports that she has been smoking Cigarettes.  She has a 15.00 pack-year smoking history. She has never used smokeless tobacco.  Objective Findings:  Vitals: Blood pressure 128/82, pulse 71,  weight 107 lb 9.6 oz (48.8 kg), last menstrual period 10/27/2015, unknown if currently breastfeeding.  Physical Examination: General appearance - alert, well appearing, and in no distress Mental status - alert, oriented to person, place, and time Pelvic -  VULVA: normal appearing vulva with no masses, tenderness or lesions, VAGINA:  Moderate-heavy vaginal discharge UTERUS: 8-10 weeks size, minimally enlarged WET MOUNT done - results:  normal lochaie   Assessment & Plan:   A:  1.Bacterial Vaginosis  2. Functional GI pain   P:  1. Metronidazole for 7 days  2. Stool softeners      By signing my name below, I, Evelene Croon, attest that this documentation has been prepared under the direction and in the presence of Jonnie Kind, MD . Electronically Signed: Evelene Croon, Scribe. 04/29/2016. 9:50 AM.I personally performed the services described in this documentation, which was SCRIBED in my presence. The recorded information has been reviewed and considered accurate. It has been edited as necessary during review. Jonnie Kind, MD

## 2016-05-02 LAB — GC/CHLAMYDIA PROBE AMP
Chlamydia trachomatis, NAA: NEGATIVE
NEISSERIA GONORRHOEAE BY PCR: NEGATIVE

## 2016-05-18 ENCOUNTER — Ambulatory Visit (INDEPENDENT_AMBULATORY_CARE_PROVIDER_SITE_OTHER): Payer: Medicaid Other | Admitting: Advanced Practice Midwife

## 2016-05-18 ENCOUNTER — Encounter: Payer: Self-pay | Admitting: Advanced Practice Midwife
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
## 2016-06-29 ENCOUNTER — Ambulatory Visit: Payer: Medicaid Other

## 2016-06-29 ENCOUNTER — Encounter: Payer: Self-pay | Admitting: *Deleted

## 2016-07-27 ENCOUNTER — Ambulatory Visit: Payer: Medicaid Other | Admitting: Women's Health

## 2016-10-27 ENCOUNTER — Emergency Department (HOSPITAL_COMMUNITY)
Admission: EM | Admit: 2016-10-27 | Discharge: 2016-10-27 | Disposition: A | Discharge status: Home / Self Care | Payer: Medicaid Other | Attending: Emergency Medicine | Admitting: Emergency Medicine

## 2016-10-27 ENCOUNTER — Encounter (HOSPITAL_COMMUNITY): Payer: Self-pay | Admitting: *Deleted

## 2016-10-27 DIAGNOSIS — R51 Headache: Secondary | ICD-10-CM | POA: Insufficient documentation

## 2016-10-27 DIAGNOSIS — R519 Headache, unspecified: Secondary | ICD-10-CM

## 2016-10-27 DIAGNOSIS — Z79899 Other long term (current) drug therapy: Secondary | ICD-10-CM | POA: Insufficient documentation

## 2016-10-27 DIAGNOSIS — I10 Essential (primary) hypertension: Secondary | ICD-10-CM | POA: Diagnosis not present

## 2016-10-27 DIAGNOSIS — Z8541 Personal history of malignant neoplasm of cervix uteri: Secondary | ICD-10-CM | POA: Insufficient documentation

## 2016-10-27 DIAGNOSIS — R251 Tremor, unspecified: Secondary | ICD-10-CM

## 2016-10-27 DIAGNOSIS — F17210 Nicotine dependence, cigarettes, uncomplicated: Secondary | ICD-10-CM | POA: Insufficient documentation

## 2016-10-27 LAB — CBC WITH DIFFERENTIAL/PLATELET
Basophils Absolute: 0 10*3/uL (ref 0.0–0.1)
Basophils Relative: 0 %
EOS ABS: 0 10*3/uL (ref 0.0–0.7)
Eosinophils Relative: 0 %
HCT: 39.9 % (ref 36.0–46.0)
Hemoglobin: 13.6 g/dL (ref 12.0–15.0)
LYMPHS ABS: 2.3 10*3/uL (ref 0.7–4.0)
Lymphocytes Relative: 28 %
MCH: 30.6 pg (ref 26.0–34.0)
MCHC: 34.1 g/dL (ref 30.0–36.0)
MCV: 89.7 fL (ref 78.0–100.0)
Monocytes Absolute: 0.3 10*3/uL (ref 0.1–1.0)
Monocytes Relative: 4 %
Neutro Abs: 5.5 10*3/uL (ref 1.7–7.7)
Neutrophils Relative %: 68 %
Platelets: 298 10*3/uL (ref 150–400)
RBC: 4.45 MIL/uL (ref 3.87–5.11)
RDW: 14.6 % (ref 11.5–15.5)
WBC: 8.3 10*3/uL (ref 4.0–10.5)

## 2016-10-27 LAB — BASIC METABOLIC PANEL
Anion gap: 9 (ref 5–15)
BUN: 9 mg/dL (ref 6–20)
CHLORIDE: 105 mmol/L (ref 101–111)
CO2: 23 mmol/L (ref 22–32)
CREATININE: 0.86 mg/dL (ref 0.44–1.00)
Calcium: 8.6 mg/dL — ABNORMAL LOW (ref 8.9–10.3)
GFR calc Af Amer: >60 mL/min (ref >60)
GFR calc non Af Amer: >60 mL/min (ref >60)
Glucose, Bld: 121 mg/dL — ABNORMAL HIGH (ref 65–99)
POTASSIUM: 3.8 mmol/L (ref 3.5–5.1)
SODIUM: 137 mmol/L (ref 135–145)

## 2016-10-27 LAB — URINALYSIS, ROUTINE W REFLEX MICROSCOPIC
Bilirubin Urine: NEGATIVE
Glucose, UA: NEGATIVE mg/dL
Hgb urine dipstick: NEGATIVE
Ketones, ur: NEGATIVE mg/dL
LEUKOCYTES UA: NEGATIVE
NITRITE: NEGATIVE
Protein, ur: NEGATIVE mg/dL
SPECIFIC GRAVITY, URINE: 1.011 (ref 1.005–1.030)
pH: 6 (ref 5.0–8.0)

## 2016-10-27 LAB — PREGNANCY, URINE: PREG TEST UR: NEGATIVE

## 2016-10-27 NOTE — ED Triage Notes (Signed)
Pt c/o headache, shaking, nausea a few days ago, trouble sleeping a few weeks ago,

## 2016-10-27 NOTE — Discharge Instructions (Signed)
Continue your medications as previously prescribed.  Follow-up with your prescribing physician to discuss your medications.

## 2016-10-27 NOTE — ED Provider Notes (Signed)
[facility] DEPT Provider Note   CSN: [id number] Arrival date & time: 10/27/16  0227     History   Chief Complaint Chief Complaint  Patient presents with  . Headache    HPI Kristin Simmons is a 31 y.o. female.  Patient is a 31 year old female with past medical history of bipolar disorder, anxiety, depression, hypertension. She presents today for evaluation of headache, feeling shaky, and generalized malaise over the past several weeks. She reports she was started on circumflex well and Zoloft 2 months ago and is concerned this may be causing her symptoms. She denies any fevers or chills. She denies any visual disturbances. Her last menstrual period was one week ago and was normal.   The history is provided by the patient.  Headache   This is a new problem. Episode onset: 2 weeks ago. Episode frequency: intermittently. The problem has been gradually worsening. The headache is associated with nothing. The pain is located in the frontal region. The quality of the pain is described as throbbing. The pain is moderate. The pain does not radiate. Pertinent negatives include no anorexia and no fever. She has tried nothing for the symptoms.    Past Medical History:  Diagnosis Date  . Anemia   . Anxiety   . Cancer (HCC)    cervical  . Depression   . Hypertension   . Vaginal Pap smear, abnormal     Patient Active Problem List   Diagnosis Date Noted  . BV (bacterial vaginosis) 04/29/2016  . Encounter for management and injection of injectable progestin contraceptive 04/13/2016  . Preterm delivery 04/11/2016  . Abnormal Pap smear of cervix 01/18/2016  . Supervision of normal pregnancy 01/13/2016  . Smoker 01/13/2016  . Depression 01/13/2016  . Alcohol use affecting pregnancy, first trimester 01/13/2016  . History of preterm delivery, currently pregnant 01/13/2016  . Underweight 01/13/2016    Past Surgical History:  Procedure Laterality Date  . NO PAST SURGERIES      OB  History    Gravida Para Term Preterm AB Living   5 4 1 3 1 4    SAB TAB Ectopic Multiple Live Births   1       4       Home Medications    Prior to Admission medications   Medication Sig Start Date End Date Taking? Authorizing Provider  QUEtiapine (SEROQUEL) 100 MG tablet Take 100 mg by mouth at bedtime.   Yes [provider]  sertraline (ZOLOFT) 100 MG tablet Take 100 mg by mouth daily.   Yes [provider]  albuterol (PROVENTIL HFA;VENTOLIN HFA) 108 (90 Base) MCG/ACT inhaler Inhale 2 puffs into the lungs every 6 (six) hours as needed for wheezing or shortness of breath.    [provider]  docusate sodium (COLACE) 100 MG capsule Take 1 capsule (100 mg total) by mouth 2 (two) times daily. 04/13/16   Mumaw, Lauralyn Primes, DO  escitalopram (LEXAPRO) 20 MG tablet Take 1 tablet (20 mg total) by mouth daily. 01/13/16   Roma Schanz, CNM  ibuprofen (ADVIL,MOTRIN) 600 MG tablet Take 1 tablet (600 mg total) by mouth every 6 (six) hours. 04/13/16   Mumaw, Lauralyn Primes, DO  prenatal vitamin w/FE, FA (PRENATAL 1 + 1) 27-1 MG TABS tablet Take 1 tablet by mouth daily at 12 noon. 12/08/15   Estill Dooms, NP    Family History Family History  Problem Relation Age of Onset  . Alzheimer's disease Paternal Grandmother   .  Cancer Maternal Grandmother   . Cancer Maternal Grandfather   . Hypertension Father   . Anemia Mother   . Hypertension Mother   . Thyroid disease Mother   . Diabetes Sister   . Hypertension Sister   . Mental illness Brother   . Asthma Daughter   . Bronchitis Daughter   . Asthma Daughter   . Bronchitis Daughter   . Asthma Daughter   . Bronchitis Daughter     Social History Social History  Substance Use Topics  . Smoking status: Current Every Day Smoker    Packs/day: 1.00    Years: 15.00    Types: Cigarettes  . Smokeless tobacco: Never Used     Comment: stopped ecig 3 days ago  . Alcohol use 0.6 oz/week    1 Cans of  beer per week     Allergies   Latex   Review of Systems Review of Systems  Constitutional: Negative for fever.  Gastrointestinal: Negative for anorexia.  Neurological: Positive for headaches.  All other systems reviewed and are negative.    Physical Exam Updated Vital Signs BP (!) 142/99   Pulse (!) 114   Temp 98.4 F (36.9 C) (Oral)   Resp 20   Ht 5\' 4"  (1.626 m)   Wt 54.4 kg (120 lb)   LMP 10/16/2016   SpO2 99%   BMI 20.60 kg/m   Physical Exam  Constitutional: She is oriented to person, place, and time. She appears well-developed and well-nourished. No distress.  HENT:  Head: Normocephalic and atraumatic.  Mouth/Throat: Oropharynx is clear and moist.  Eyes: Pupils are equal, round, and reactive to light. EOM are normal.  Neck: Normal range of motion. Neck supple.  Cardiovascular: Normal rate and regular rhythm.  Exam reveals no gallop and no friction rub.   No murmur heard. Pulmonary/Chest: Effort normal and breath sounds normal. No respiratory distress. She has no wheezes.  Abdominal: Soft. Bowel sounds are normal. She exhibits no distension. There is no tenderness.  Musculoskeletal: Normal range of motion.  Lymphadenopathy:    She has no cervical adenopathy.  Neurological: She is alert and oriented to person, place, and time. No cranial nerve deficit. She exhibits normal muscle tone. Coordination normal.  Skin: Skin is warm and dry. She is not diaphoretic.  Nursing note and vitals reviewed.    ED Treatments / Results  Labs (all labs ordered are listed, but only abnormal results are displayed) Labs Reviewed  BASIC METABOLIC PANEL  CBC WITH DIFFERENTIAL/PLATELET  URINALYSIS, ROUTINE W REFLEX MICROSCOPIC  PREGNANCY, URINE    EKG  EKG Interpretation None       Radiology No results found.  Procedures Procedures (including critical care time)  Medications Ordered in ED Medications - No data to display   Initial Impression / Assessment and  Plan / ED Course  I have reviewed the triage vital signs and the nursing notes.  Pertinent labs & imaging results that were available during my care of the patient were reviewed by me and considered in my medical decision making (see chart for details).  Patient presents here with multiple complaints including shakiness, headache, and generally not feeling well over the past several weeks. Her physical examination is unremarkable and laboratory studies are normal. Her EKG is unchanged and urinalysis and pregnancy test are both negative.  I am uncertain as to the exact etiology of her symptoms, however nothing appears emergent. She is been ambulatory throughout the department with no difficulty and appears clinically well.  Her symptoms may be side effects from her recently prescribed seroquel and zoloft. I will have her follow-up with her prescribing physician to determine the appropriate course of action.  Final Clinical Impressions(s) / ED Diagnoses   Final diagnoses:  None    New Prescriptions New Prescriptions   No medications on file     Veryl Speak, MD 10/27/16 [phone]

## 2016-12-15 IMAGING — DX DG CHEST 2V
2 series · 2 of 2 positions shown · non-contrast
Comparison: August 13, 2014

CLINICAL DATA: Cough and congestion

EXAM:
CHEST  2 VIEW

[chest pa]
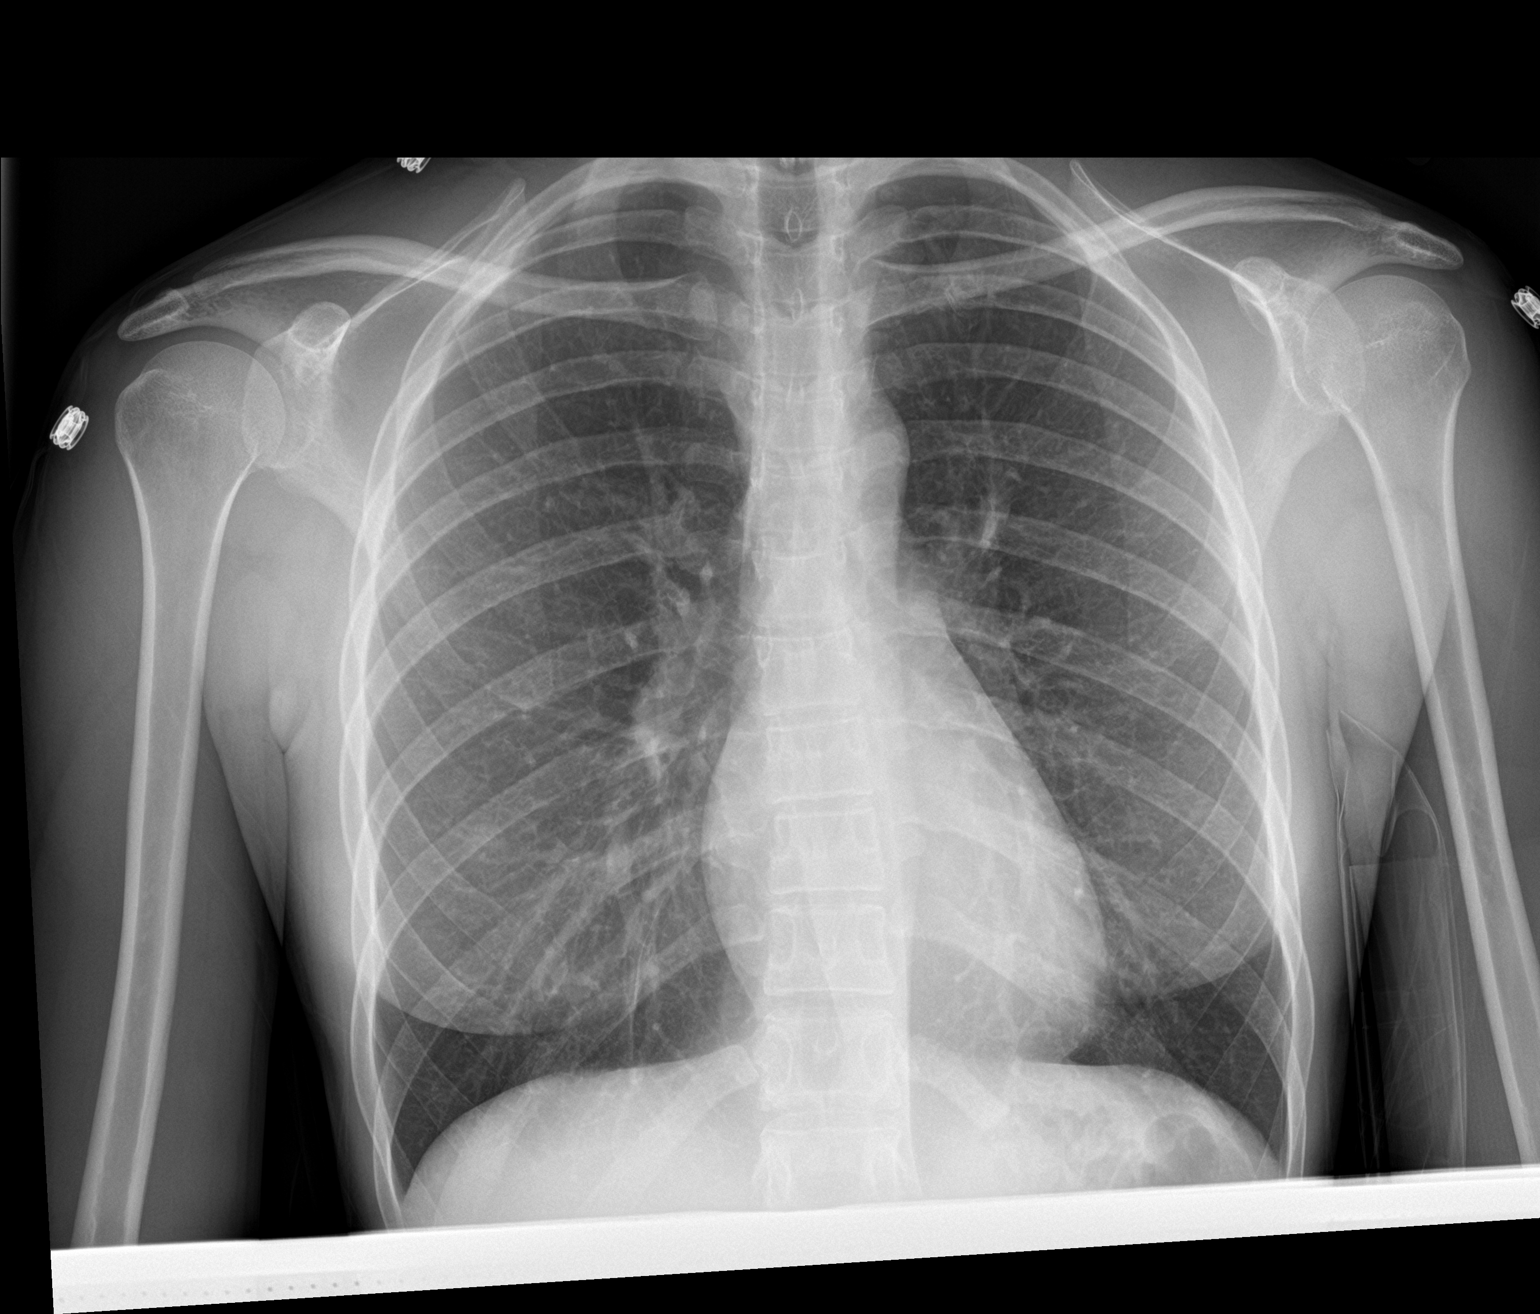

[chest lat]
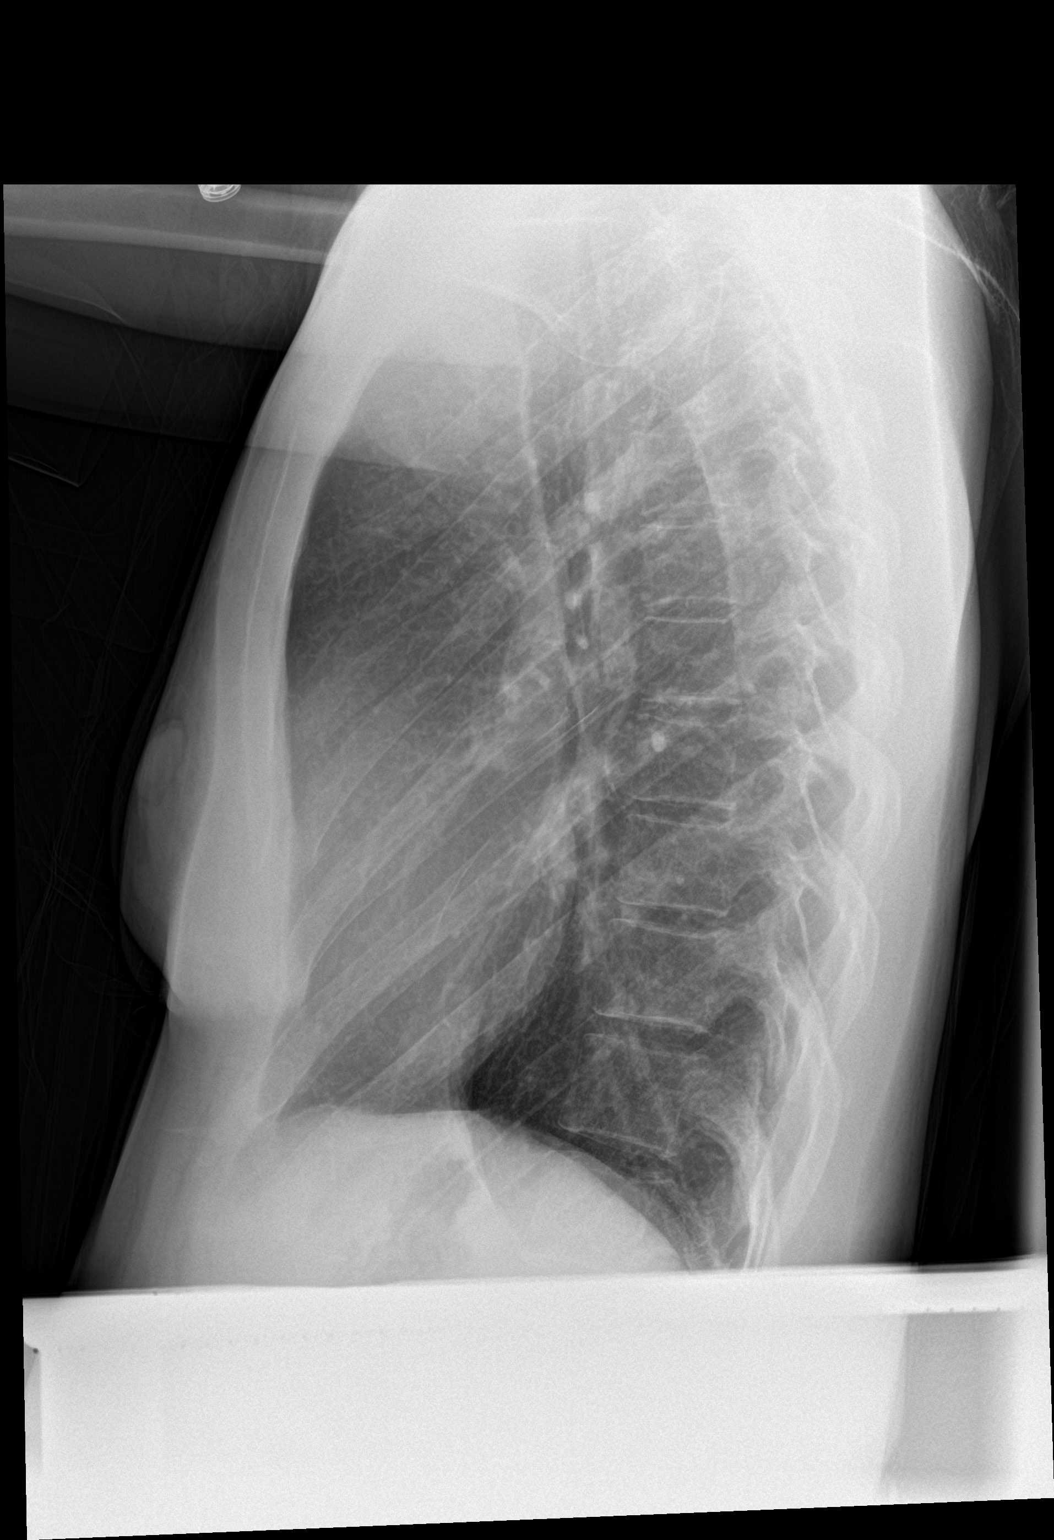

[2 of 2 positions shown; findings below may reference images not displayed]

FINDINGS: The heart size and mediastinal contours are within normal limits.
Both lungs are clear. The visualized skeletal structures are
unremarkable.
IMPRESSION: No active cardiopulmonary disease.

## 2017-05-11 ENCOUNTER — Emergency Department (HOSPITAL_COMMUNITY)
Admission: EM | Admit: 2017-05-11 | Discharge: 2017-05-12 | Disposition: A | Discharge status: Home / Self Care | Payer: Medicaid Other | Attending: Emergency Medicine | Admitting: Emergency Medicine

## 2017-05-11 ENCOUNTER — Encounter (HOSPITAL_COMMUNITY): Payer: Self-pay | Admitting: Emergency Medicine

## 2017-05-11 ENCOUNTER — Other Ambulatory Visit: Payer: Self-pay

## 2017-05-11 DIAGNOSIS — R197 Diarrhea, unspecified: Secondary | ICD-10-CM

## 2017-05-11 DIAGNOSIS — Z79899 Other long term (current) drug therapy: Secondary | ICD-10-CM | POA: Insufficient documentation

## 2017-05-11 DIAGNOSIS — F329 Major depressive disorder, single episode, unspecified: Secondary | ICD-10-CM | POA: Insufficient documentation

## 2017-05-11 DIAGNOSIS — F17210 Nicotine dependence, cigarettes, uncomplicated: Secondary | ICD-10-CM | POA: Insufficient documentation

## 2017-05-11 DIAGNOSIS — Z8541 Personal history of malignant neoplasm of cervix uteri: Secondary | ICD-10-CM | POA: Insufficient documentation

## 2017-05-11 DIAGNOSIS — R112 Nausea with vomiting, unspecified: Secondary | ICD-10-CM | POA: Insufficient documentation

## 2017-05-11 DIAGNOSIS — I10 Essential (primary) hypertension: Secondary | ICD-10-CM | POA: Insufficient documentation

## 2017-05-11 DIAGNOSIS — F419 Anxiety disorder, unspecified: Secondary | ICD-10-CM | POA: Insufficient documentation

## 2017-05-11 DIAGNOSIS — Z91040 Latex allergy status: Secondary | ICD-10-CM | POA: Insufficient documentation

## 2017-05-11 LAB — I-STAT CHEM 8, ED
BUN: 9 mg/dL (ref 6–20)
CHLORIDE: 106 mmol/L (ref 101–111)
Calcium, Ion: 1.15 mmol/L (ref 1.15–1.40)
Creatinine, Ser: 1.2 mg/dL — ABNORMAL HIGH (ref 0.44–1.00)
Glucose, Bld: 112 mg/dL — ABNORMAL HIGH (ref 65–99)
HCT: 45 % (ref 36.0–46.0)
HEMOGLOBIN: 15.3 g/dL — AB (ref 12.0–15.0)
POTASSIUM: 3.7 mmol/L (ref 3.5–5.1)
SODIUM: 143 mmol/L (ref 135–145)
TCO2: 22 mmol/L (ref 22–32)

## 2017-05-11 LAB — POC URINE PREG, ED: Preg Test, Ur: NEGATIVE

## 2017-05-11 MED ORDER — SODIUM CHLORIDE 0.9 % IV BOLUS
1000.0000 mL | Freq: Once | INTRAVENOUS | Status: AC
  Administered 2017-05-11: 1000 mL via INTRAVENOUS

## 2017-05-11 MED ORDER — ONDANSETRON HCL 4 MG/2ML IJ SOLN
4.0000 mg | Freq: Once | INTRAMUSCULAR | Status: AC
  Administered 2017-05-11: 4 mg via INTRAVENOUS
  Filled 2017-05-11: qty 2

## 2017-05-11 NOTE — ED Triage Notes (Signed)
Pt c/o vomiting since yesterday with diarrhea since this morning and has felt weak, pt reports increased stress

## 2017-05-11 NOTE — ED Provider Notes (Signed)
[facility] EMERGENCY DEPARTMENT Provider Note   CSN: [id number] Arrival date & time: 05/11/17  2229     History   Chief Complaint Chief Complaint  Patient presents with  . Emesis    HPI Kristin Simmons is a 32 y.o. female.  The history is provided by the patient.  Emesis   This is a new problem. The current episode started 12 to 24 hours ago. The problem occurs 2 to 4 times per day. The problem has been gradually worsening. The emesis has an appearance of stomach contents. There has been no fever. Associated symptoms include cough and diarrhea. Pertinent negatives include no abdominal pain and no fever.  Patient with history of anemia/anxiety presents with vomiting and diarrhea for the past 12-24 hours.  She reports multiple episodes of both.  She reports both nonbloody.  No fever.  No new abdominal pain.  She reports chronic back pain.  She also reports mild cough.  No travel.  No sick contacts. She reports dizziness upon standing. Past Medical History:  Diagnosis Date  . Anemia   . Anxiety   . Cancer (HCC)    cervical  . Depression   . Hypertension   . Vaginal Pap smear, abnormal     Patient Active Problem List   Diagnosis Date Noted  . BV (bacterial vaginosis) 04/29/2016  . Encounter for management and injection of injectable progestin contraceptive 04/13/2016  . Preterm delivery 04/11/2016  . Abnormal Pap smear of cervix 01/18/2016  . Supervision of normal pregnancy 01/13/2016  . Smoker 01/13/2016  . Depression 01/13/2016  . Alcohol use affecting pregnancy, first trimester 01/13/2016  . History of preterm delivery, currently pregnant 01/13/2016  . Underweight 01/13/2016    Past Surgical History:  Procedure Laterality Date  . NO PAST SURGERIES       OB History    Gravida  5   Para  4   Term  1   Preterm  3   AB  1   Living  4     SAB  1   TAB      Ectopic      Multiple      Live Births  4            Home Medications    Prior  to Admission medications   Medication Sig Start Date End Date Taking? Authorizing Provider  albuterol (PROVENTIL HFA;VENTOLIN HFA) 108 (90 Base) MCG/ACT inhaler Inhale 2 puffs into the lungs every 6 (six) hours as needed for wheezing or shortness of breath.    [provider]  docusate sodium (COLACE) 100 MG capsule Take 1 capsule (100 mg total) by mouth 2 (two) times daily. 04/13/16   Mumaw, Lauralyn Primes, DO  escitalopram (LEXAPRO) 20 MG tablet Take 1 tablet (20 mg total) by mouth daily. 01/13/16   Roma Schanz, CNM  ibuprofen (ADVIL,MOTRIN) 600 MG tablet Take 1 tablet (600 mg total) by mouth every 6 (six) hours. 04/13/16   Mumaw, Lauralyn Primes, DO  prenatal vitamin w/FE, FA (PRENATAL 1 + 1) 27-1 MG TABS tablet Take 1 tablet by mouth daily at 12 noon. 12/08/15   Estill Dooms, NP  QUEtiapine (SEROQUEL) 100 MG tablet Take 100 mg by mouth at bedtime.    [provider]  sertraline (ZOLOFT) 100 MG tablet Take 100 mg by mouth daily.    [provider]    Family History Family History  Problem Relation Age of Onset  . Alzheimer's disease  Paternal Grandmother   . Cancer Maternal Grandmother   . Cancer Maternal Grandfather   . Hypertension Father   . Anemia Mother   . Hypertension Mother   . Thyroid disease Mother   . Diabetes Sister   . Hypertension Sister   . Mental illness Brother   . Asthma Daughter   . Bronchitis Daughter   . Asthma Daughter   . Bronchitis Daughter   . Asthma Daughter   . Bronchitis Daughter     Social History Social History   Tobacco Use  . Smoking status: Current Every Day Smoker    Packs/day: 1.00    Years: 15.00    Pack years: 15.00    Types: Cigarettes  . Smokeless tobacco: Never Used  . Tobacco comment: stopped ecig 3 days ago  Substance Use Topics  . Alcohol use: Yes    Alcohol/week: 0.6 - 1.2 oz    Types: 1 - 2 Cans of beer per week    Comment: monthly  . Drug use: No    Types: Marijuana      Allergies   Latex   Review of Systems Review of Systems  Constitutional: Negative for fever.  Respiratory: Positive for cough.   Cardiovascular: Negative for chest pain.  Gastrointestinal: Positive for diarrhea and vomiting. Negative for abdominal pain.  Genitourinary: Positive for menstrual problem. Negative for dysuria.  Neurological: Positive for light-headedness.  All other systems reviewed and are negative.    Physical Exam Updated Vital Signs BP (!) 113/93 (BP Location: Right Arm)   Pulse (!) 112   Temp 98.5 F (36.9 C) (Oral)   Resp 14   Ht 1.626 m (5\' 4" )   Wt 56.7 kg (125 lb)   LMP  (Approximate)   SpO2 98%   BMI 21.46 kg/m   Physical Exam CONSTITUTIONAL: Well developed/well nourished HEAD: Normocephalic/atraumatic EYES: EOMI/PERRL, no icterus ENMT: Mucous membranes dry NECK: supple no meningeal signs SPINE/BACK:entire spine nontender CV: S1/S2 noted, no murmurs/rubs/gallops noted LUNGS: Lungs are clear to auscultation bilaterally, no apparent distress ABDOMEN: soft, nontender, no rebound or guarding, bowel sounds noted throughout abdomen GU:no cva tenderness NEURO: Pt is awake/alert/appropriate, moves all extremitiesx4.  No facial droop.   EXTREMITIES: pulses normal/equal, full ROM SKIN: warm, color normal PSYCH: no abnormalities of mood noted, alert and oriented to situation   ED Treatments / Results  Labs (all labs ordered are listed, but only abnormal results are displayed) Labs Reviewed  URINALYSIS, ROUTINE W REFLEX MICROSCOPIC - Abnormal; Notable for the following components:      Result Value   Color, Urine COLORLESS (*)    Specific Gravity, Urine 1.002 (*)    All other components within normal limits  I-STAT CHEM 8, ED - Abnormal; Notable for the following components:   Creatinine, Ser 1.20 (*)    Glucose, Bld 112 (*)    Hemoglobin 15.3 (*)    All other components within normal limits  POC URINE PREG, ED     EKG None  Radiology No results found.  Procedures Procedures (including critical care time)  Medications Ordered in ED Medications  ondansetron (ZOFRAN) injection 4 mg (4 mg Intravenous Given 05/11/17 2340)  sodium chloride 0.9 % bolus 1,000 mL (0 mLs Intravenous Stopped 05/12/17 0025)     Initial Impression / Assessment and Plan / ED Course  I have reviewed the triage vital signs and the nursing notes.  Pertinent labs  results that were available during my care of the patient were reviewed by me  and considered in my medical decision making (see chart for details).     11:49 PM Plan to rehydrate and reassess.  Patient has no focal abdominal tenderness.  At time of discharge:  Patient improved.  She ambulated without difficulty.  She is taking p.o. Fluids. No abdominal pain reported.  I feel she is appropriate for discharge.  Suspect viral illness. She reports recent irregular periods Will refer to GYN.  She also reports she may have cervical cancer but has been lost to follow-up.  I strongly encouraged her to call OB/GYN for further evaluation in the next week Final Clinical Impressions(s) / ED Diagnoses   Final diagnoses:  Nausea vomiting and diarrhea    ED Discharge Orders    None       Ripley Fraise, MD 05/12/17 [phone]

## 2017-05-12 ENCOUNTER — Other Ambulatory Visit: Payer: Self-pay

## 2017-05-12 LAB — URINALYSIS, ROUTINE W REFLEX MICROSCOPIC
Bilirubin Urine: NEGATIVE
Glucose, UA: NEGATIVE mg/dL
Hgb urine dipstick: NEGATIVE
KETONES UR: NEGATIVE mg/dL
Leukocytes, UA: NEGATIVE
NITRITE: NEGATIVE
PH: 6 (ref 5.0–8.0)
Protein, ur: NEGATIVE mg/dL
Specific Gravity, Urine: 1.002 — ABNORMAL LOW (ref 1.005–1.030)

## 2017-05-12 MED ORDER — ONDANSETRON 8 MG PO TBDP: ORAL_TABLET | ORAL | 0 refills | Status: DC

## 2017-05-12 NOTE — ED Notes (Signed)
Pt ambulated down hallway with steady gait, pt denies dizziness or weakness with ambulation.

## 2017-05-12 NOTE — Discharge Instructions (Addendum)
°  SEEK IMMEDIATE MEDICAL ATTENTION IF: The pain does not go away or becomes severe, particularly over the next 8-12 hours.  A temperature above 100.61F develops.  Repeated vomiting occurs (multiple episodes).  The pain becomes localized to portions of the abdomen. The right side could possibly be appendicitis. In an adult, the left lower portion of the abdomen could be colitis or diverticulitis.  Blood is being passed in stools or vomit (bright red or black tarry stools).  Return also if you develop chest pain, difficulty breathing, dizziness or fainting, or become confused, poorly responsive, or inconsolable.  As we discussed, please follow-up with gynecology for concerns about your missed periods, as well as follow-up for her cervical cancer

## 2017-05-12 NOTE — ED Notes (Signed)
Pt states she is two weeks late for her period menstrual cycle.  POC urine pregnancy test negative

## 2017-05-12 NOTE — ED Notes (Signed)
Pt given water, pt states "I feel much better now"

## 2017-07-29 ENCOUNTER — Emergency Department (HOSPITAL_COMMUNITY)
Admission: EM | Admit: 2017-07-29 | Discharge: 2017-07-29 | Disposition: A | Discharge status: Home / Self Care | Payer: Medicaid Other | Source: Home / Self Care

## 2017-07-29 NOTE — ED Notes (Signed)
Called for triage, no answer

## 2017-07-29 NOTE — ED Notes (Signed)
2nd call for triage, no answer.

## 2017-09-05 ENCOUNTER — Other Ambulatory Visit: Payer: Self-pay | Admitting: Obstetrics and Gynecology

## 2017-09-05 DIAGNOSIS — O3680X0 Pregnancy with inconclusive fetal viability, not applicable or unspecified: Secondary | ICD-10-CM

## 2017-09-11 ENCOUNTER — Ambulatory Visit (INDEPENDENT_AMBULATORY_CARE_PROVIDER_SITE_OTHER): Payer: Medicaid Other

## 2017-09-11 DIAGNOSIS — O3680X0 Pregnancy with inconclusive fetal viability, not applicable or unspecified: Secondary | ICD-10-CM | POA: Diagnosis not present

## 2017-09-11 NOTE — Progress Notes (Signed)
[place] 9+4 wks,single IUP w/ys,positive fht 157 bpm,normal ovaries bilat,subchorionic hemorrhage 3.9 x .9 x 1.7,crl 27.4 mm

## 2017-09-12 DIAGNOSIS — F419 Anxiety disorder, unspecified: Secondary | ICD-10-CM | POA: Diagnosis present

## 2017-09-13 DIAGNOSIS — F4321 Adjustment disorder with depressed mood: Secondary | ICD-10-CM | POA: Insufficient documentation

## 2017-09-20 ENCOUNTER — Ambulatory Visit (INDEPENDENT_AMBULATORY_CARE_PROVIDER_SITE_OTHER): Payer: Medicaid Other | Admitting: Women's Health

## 2017-09-20 ENCOUNTER — Encounter: Payer: Self-pay | Admitting: Women's Health

## 2017-09-20 ENCOUNTER — Other Ambulatory Visit: Payer: Self-pay | Admitting: Women's Health

## 2017-09-20 VITALS — BP 127/78 | HR 75 | Ht 64.0 in | Wt 129.0 lb

## 2017-09-20 DIAGNOSIS — N760 Acute vaginitis: Secondary | ICD-10-CM | POA: Diagnosis not present

## 2017-09-20 DIAGNOSIS — Z3201 Encounter for pregnancy test, result positive: Secondary | ICD-10-CM | POA: Diagnosis not present

## 2017-09-20 DIAGNOSIS — B9689 Other specified bacterial agents as the cause of diseases classified elsewhere: Secondary | ICD-10-CM

## 2017-09-20 DIAGNOSIS — N938 Other specified abnormal uterine and vaginal bleeding: Secondary | ICD-10-CM | POA: Diagnosis not present

## 2017-09-20 DIAGNOSIS — O209 Hemorrhage in early pregnancy, unspecified: Secondary | ICD-10-CM

## 2017-09-20 LAB — POCT URINE PREGNANCY: PREG TEST UR: POSITIVE — AB

## 2017-09-20 LAB — POCT WET PREP (WET MOUNT)
CLUE CELLS WET PREP WHIFF POC: POSITIVE
TRICHOMONAS WET PREP HPF POC: ABSENT

## 2017-09-20 MED ORDER — METRONIDAZOLE 500 MG PO TABS: 500.0000 mg | ORAL_TABLET | Freq: Two times a day (BID) | ORAL | 0 refills | Status: DC

## 2017-09-20 NOTE — Progress Notes (Signed)
   Work-in GYN VISIT Patient name: Kristin Simmons MRN [id number]  Date of birth: 04-07-1985 Chief Complaint:   having vaginal bleeding (started yesterday)  History of Present Illness:   Kristin Simmons is a 32 y.o. [phone] African American female at [redacted]w[redacted]d being seen today as a walk-in for report of vaginal bleeding since yesterday. Started off as bright red, heavy yesterday, has lightened today. No cramping. Last sex 4d ago. Denies abnormal discharge, itching/odor/irritation.  Taking pnv. No n/v, some gagging. Has new ob 8/22.   No LMP recorded (lmp unknown). Patient is pregnant. Review of Systems:   Pertinent items are noted in HPI Denies fever/chills, dizziness, headaches, visual disturbances, fatigue, shortness of breath, chest pain, abdominal pain, vomiting, abnormal vaginal discharge/itching/odor/irritation, problems with periods, bowel movements, urination, or intercourse unless otherwise stated above.  Pertinent History Reviewed:  Reviewed past medical,surgical, social, obstetrical and family history.  Reviewed problem list, medications and allergies. Physical Assessment:   Vitals:   09/20/17 1552  BP: 127/78  Pulse: 75  Weight: 129 lb (58.5 kg)  Height: 5\' 4"  (1.626 m)  Body mass index is 22.14 kg/m.       Physical Examination:   General appearance: alert, well appearing, and in no distress  Mental status: alert, oriented to person, place, and time  Skin: warm & dry   Cardiovascular: normal heart rate noted  Respiratory: normal respiratory effort, no distress  Abdomen: soft, non-tender   Pelvic: VULVA: normal appearing vulva with no masses, tenderness or lesions, VAGINA: normal appearing vagina with normal color and mod amt maroon colored malodorous discharge, no lesions, CERVIX: normal appearing cervix without discharge or lesions  Extremities: no edema   Results for orders placed or performed in visit on 09/20/17 (from the past 24 hour(s))  POCT urine pregnancy   Collection Time: 09/20/17  3:56 PM  Result Value Ref Range   Preg Test, Ur Positive (A) Negative  POCT Wet Prep [facility])   Collection Time: 09/20/17  4:10 PM  Result Value Ref Range   Source Wet Prep POC vaginal    WBC, Wet Prep HPF POC few    Bacteria Wet Prep HPF POC Few Few   BACTERIA WET PREP MORPHOLOGY POC     Clue Cells Wet Prep HPF POC Many (A) None   Clue Cells Wet Prep Whiff POC Positive Whiff    Yeast Wet Prep HPF POC None None   KOH Wet Prep POC  None   Trichomonas Wet Prep HPF POC Absent Absent    Assessment & Plan:  1) [redacted]w[redacted]d pregnant  2) Vaginal bleeding> Rh+, pelvic rest x at least 7d from last time seeing anything that resembles blood. Also send gc/ct. Discussed warning s/s to report, reasons to seek care.  3) BV> Rx metronidazole 500mg  BID x 7d for BV, no sex while taking   Meds:  Meds ordered this encounter  Medications  . metroNIDAZOLE (FLAGYL) 500 MG tablet    Sig: Take 1 tablet (500 mg total) by mouth 2 (two) times daily.    Dispense:  14 tablet    Refill:  0    Order Specific Question:   Supervising Provider    Answer:   Tania Ade H [[id number]]    Orders Placed This Encounter  Procedures  . GC/Chlamydia Probe Amp  . POCT urine pregnancy  . POCT Wet Prep [facility])    Return for As scheduled.  [place], [facility] 09/20/2017 4:13 PM

## 2017-09-22 LAB — GC/CHLAMYDIA PROBE AMP
Chlamydia trachomatis, NAA: NEGATIVE
NEISSERIA GONORRHOEAE BY PCR: NEGATIVE

## 2017-10-05 ENCOUNTER — Ambulatory Visit: Payer: Medicaid Other | Admitting: *Deleted

## 2017-10-05 ENCOUNTER — Encounter: Payer: Medicaid Other | Admitting: Women's Health

## 2017-10-19 ENCOUNTER — Encounter: Payer: Self-pay | Admitting: Women's Health

## 2017-10-19 ENCOUNTER — Ambulatory Visit (INDEPENDENT_AMBULATORY_CARE_PROVIDER_SITE_OTHER): Payer: Medicaid Other | Admitting: Women's Health

## 2017-10-19 ENCOUNTER — Ambulatory Visit: Payer: Medicaid Other | Admitting: *Deleted

## 2017-10-19 VITALS — BP 111/76 | HR 78 | Wt 132.0 lb

## 2017-10-19 DIAGNOSIS — Z1389 Encounter for screening for other disorder: Secondary | ICD-10-CM

## 2017-10-19 DIAGNOSIS — Z87898 Personal history of other specified conditions: Secondary | ICD-10-CM | POA: Insufficient documentation

## 2017-10-19 DIAGNOSIS — O99332 Smoking (tobacco) complicating pregnancy, second trimester: Secondary | ICD-10-CM

## 2017-10-19 DIAGNOSIS — O09212 Supervision of pregnancy with history of pre-term labor, second trimester: Secondary | ICD-10-CM | POA: Diagnosis not present

## 2017-10-19 DIAGNOSIS — O09899 Supervision of other high risk pregnancies, unspecified trimester: Secondary | ICD-10-CM

## 2017-10-19 DIAGNOSIS — Z3490 Encounter for supervision of normal pregnancy, unspecified, unspecified trimester: Secondary | ICD-10-CM | POA: Insufficient documentation

## 2017-10-19 DIAGNOSIS — F17210 Nicotine dependence, cigarettes, uncomplicated: Secondary | ICD-10-CM

## 2017-10-19 DIAGNOSIS — F1491 Cocaine use, unspecified, in remission: Secondary | ICD-10-CM | POA: Insufficient documentation

## 2017-10-19 DIAGNOSIS — Z363 Encounter for antenatal screening for malformations: Secondary | ICD-10-CM

## 2017-10-19 DIAGNOSIS — O09219 Supervision of pregnancy with history of pre-term labor, unspecified trimester: Secondary | ICD-10-CM

## 2017-10-19 DIAGNOSIS — Z331 Pregnant state, incidental: Secondary | ICD-10-CM

## 2017-10-19 DIAGNOSIS — Z3482 Encounter for supervision of other normal pregnancy, second trimester: Secondary | ICD-10-CM

## 2017-10-19 DIAGNOSIS — Z3A15 15 weeks gestation of pregnancy: Secondary | ICD-10-CM

## 2017-10-19 MED ORDER — DOXYLAMINE-PYRIDOXINE 10-10 MG PO TBEC: DELAYED_RELEASE_TABLET | ORAL | 6 refills | Status: DC

## 2017-10-19 NOTE — Progress Notes (Signed)
INITIAL OBSTETRICAL VISIT Patient name: Kristin Simmons MRN [id number]  Date of birth: 04-19-1985 Chief Complaint:   Initial Prenatal Visit ("burning in stomach")  History of Present Illness:   Kristin Simmons is a 32 y.o. [phone] African American female at [redacted]w[redacted]d by 9wk u/s, with an Estimated Date of Delivery: 04/12/18 being seen today for her initial obstetrical visit.   Her obstetrical history is significant for 28, 24, 25wk PTB d/t labor, one 40wk birth.   Today she reports n/v, problems sleeping. H/O cocaine use, etoh during pregnancy- denies any of that this pregnancy. Smoking 4-5cigarettes/day, down from 1ppd prior to pregnancy. Does not have custody of her children right now, in foster care x 2 years, gets them back 9/26. Center of abdomen has been 'burning', and has knot above belly button that comes and goes x 49yr.  No LMP recorded (lmp unknown). Patient is pregnant. Last pap recently at [facility] per pt. Results were: normal Review of Systems:   Pertinent items are noted in HPI Denies cramping/contractions, leakage of fluid, vaginal bleeding, abnormal vaginal discharge w/ itching/odor/irritation, headaches, visual changes, shortness of breath, chest pain, abdominal pain, severe nausea/vomiting, or problems with urination or bowel movements unless otherwise stated above.  Pertinent History Reviewed:  Reviewed past medical,surgical, social, obstetrical and family history.  Reviewed problem list, medications and allergies. OB History  Gravida Para Term Preterm AB Living  6 4 1 3 1 4   SAB TAB Ectopic Multiple Live Births  1       4    # Outcome Date GA Lbr Len/2nd Weight Sex Delivery Anes PTL Lv  6 Current           5 Preterm 04/11/16 [redacted]w[redacted]d  1 lb 5 oz (0.595 kg) F Vag-Spont None N LIV     Complications: Preterm labor  4 SAB 2016          3 Term 12/02/13 [redacted]w[redacted]d  5 lb 4 oz (2.381 kg) F Vag-Spont None N LIV  2 Preterm 06/17/07 [redacted]w[redacted]d  4 lb 11 oz (2.126 kg) F Vag-Spont None N LIV   Complications: Preterm premature rupture of membranes (PPROM) delivered, current hospitalization  1 Preterm 12/11/05 [redacted]w[redacted]d  5 lb 9 oz (2.523 kg) F Vag-Spont EPI N LIV     Complications: Preterm premature rupture of membranes (PPROM) delivered, current hospitalization   Physical Assessment:   Vitals:   10/19/17 1434  BP: 111/76  Pulse: 78  Weight: 132 lb (59.9 kg)  Body mass index is 22.66 kg/m.       Physical Examination:  General appearance - well appearing, and in no distress  Mental status - alert, oriented to person, place, and time  Psych:  She has a normal mood and affect  Skin - warm and dry, normal color, no suspicious lesions noted  Chest - effort normal, all lung fields clear to auscultation bilaterally  Heart - normal rate and regular rhythm  Abdomen - soft, nontender, ~1cm firm round mass above umbilicus, co-exam w/ JVF feels it is a hernia/sq fat  Extremities:  No swelling or varicosities noted  Thin prep pap is not done  Fetal Heart Rate (bpm): 150 via doppler  No results found for this or any previous visit (from the past 24 hour(s)).  Assessment & Plan:  1) Low-Risk Pregnancy [id number] at [redacted]w[redacted]d with an Estimated Date of Delivery: 04/12/18   2) Initial OB visit  3) H/O PTB x 3> 28, 24, 25wks, offered Makena, accepted, ordered today,  start next week  4) H/O cocaine and etoh use> denies current use  5) Smoker> has cut down from 1ppd to 4-5/day, counseled x 3-60mins, advised cessation, discussed risks to fetus while pregnant, to infant pp, and to herself.   6) N/V> rx diclegis  Meds:  Meds ordered this encounter  Medications  . Doxylamine-Pyridoxine (DICLEGIS) 10-10 MG TBEC    Sig: 2 tabs q hs, if sx persist add 1 tab q am on day 3, if sx persist add 1 tab q afternoon on day 4    Dispense:  100 tablet    Refill:  6    Order Specific Question:   Supervising Provider    Answer:   Florian Buff [[id number]]    Initial labs obtained Get pap results from  [facility] Continue prenatal vitamins Reviewed n/v relief measures and warning s/s to report Reviewed recommended weight gain based on pre-gravid BMI Encouraged well-balanced diet Genetic Screening discussed Quad Screen: declined Cystic fibrosis screening discussed declined Ultrasound discussed; fetal survey: requested CCNC completed>spoke w/ PCM Declined flu shot. She was advised that the flu shot is recommended during pregnancy to help protect her and her baby. We discussed that it is an inactivated vaccine-so side effects are minimal, it is considered safe to receive during any trimester, and pregnant women are at a higher risk of developing potential complications from the flu, including death. She was given printed information from the CDC regarding the flu shot and the flu.    Follow-up: Return in about 1 week (around 10/26/2017) for Weekly 17P, then 3wks for LROB, anatomy u/s.   Orders Placed This Encounter  Procedures  . GC/Chlamydia Probe Amp  . Urine Culture  . US OB Comp + 14 Wk  . Urinalysis, Routine w reflex microscopic  . Obstetric Panel, Including HIV  . Pain Management Screening Profile (10S)  . POC Urinalysis Dipstick OB    Roma Schanz CNM, [facility] 10/19/2017 3:47 PM

## 2017-10-19 NOTE — Patient Instructions (Addendum)
Kristin Simmons, I greatly value your feedback.  If you receive a survey following your visit with [place] today, we appreciate you taking the time to fill it out.  Thanks, Knute Neu, CNM, WHNP-BC  Tips to Help You Sleep Better:   Get into a bedtime routine, try to do the same thing every night before going to bed to try to help your body wind down  Warm baths  Avoid caffeine for at least 3 hours before going to sleep   Keep your room at a slightly cooler temperature, can try running a fan  Turn off TV, lights, phone, electronics  Lots of pillows if needed to help you get comfortable  Lavender scented items can help you sleep. You can place lavender essential oil on a cotton ball and place under your pillowcase, or place in a diffuser. [employer] has a lavender scented sleep line (plug-ins, sprays, etc). Look in the pillow aisle for lavender scented pillows.   If none of the above things help, you can try 1/2 to 1 tablet of benadryl, unisom, or tylenol pm. Do not take this every night, only when you really need it.      Second Trimester of Pregnancy The second trimester is from week 14 through week 27 (months 4 through 6). The second trimester is often a time when you feel your best. Your body has adjusted to being pregnant, and you begin to feel better physically. Usually, morning sickness has lessened or quit completely, you may have more energy, and you may have an increase in appetite. The second trimester is also a time when the fetus is growing rapidly. At the end of the sixth month, the fetus is about 9 inches long and weighs about 1 pounds. You will likely begin to feel the baby move (quickening) between 16 and 20 weeks of pregnancy. Body changes during your second trimester Your body continues to go through many changes during your second trimester. The changes vary from woman to woman.  Your weight will continue to increase. You will notice your lower abdomen bulging out.  You may  begin to get stretch marks on your hips, abdomen, and breasts.  You may develop headaches that can be relieved by medicines. The medicines should be approved by your health care provider.  You may urinate more often because the fetus is pressing on your bladder.  You may develop or continue to have heartburn as a result of your pregnancy.  You may develop constipation because certain hormones are causing the muscles that push waste through your intestines to slow down.  You may develop hemorrhoids or swollen, bulging veins (varicose veins).  You may have back pain. This is caused by: ? Weight gain. ? Pregnancy hormones that are relaxing the joints in your pelvis. ? A shift in weight and the muscles that support your balance.  Your breasts will continue to grow and they will continue to become tender.  Your gums may bleed and may be sensitive to brushing and flossing.  Dark spots or blotches (chloasma, mask of pregnancy) may develop on your face. This will likely fade after the baby is born.  A dark line from your belly button to the pubic area (linea nigra) may appear. This will likely fade after the baby is born.  You may have changes in your hair. These can include thickening of your hair, rapid growth, and changes in texture. Some women also have hair loss during or after pregnancy, or hair that  feels dry or thin. Your hair will most likely return to normal after your baby is born.  What to expect at prenatal visits During a routine prenatal visit:  You will be weighed to make sure you and the fetus are growing normally.  Your blood pressure will be taken.  Your abdomen will be measured to track your baby's growth.  The fetal heartbeat will be listened to.  Any test results from the previous visit will be discussed.  Your health care provider may ask you:  How you are feeling.  If you are feeling the baby move.  If you have had any abnormal symptoms, such as leaking  fluid, bleeding, severe headaches, or abdominal cramping.  If you are using any tobacco products, including cigarettes, chewing tobacco, and electronic cigarettes.  If you have any questions.  Other tests that may be performed during your second trimester include:  Blood tests that check for: ? Low iron levels (anemia). ? High blood sugar that affects pregnant women (gestational diabetes) between 28 and 28 weeks. ? Rh antibodies. This is to check for a protein on red blood cells (Rh factor).  Urine tests to check for infections, diabetes, or protein in the urine.  An ultrasound to confirm the proper growth and development of the baby.  An amniocentesis to check for possible genetic problems.  Fetal screens for spina bifida and Down syndrome.  HIV (human immunodeficiency virus) testing. Routine prenatal testing includes screening for HIV, unless you choose not to have this test.  Follow these instructions at home: Medicines  Follow your health care provider's instructions regarding medicine use. Specific medicines may be either safe or unsafe to take during pregnancy.  Take a prenatal vitamin that contains at least 600 micrograms (mcg) of folic acid.  If you develop constipation, try taking a stool softener if your health care provider approves. Eating and drinking  Eat a balanced diet that includes fresh fruits and vegetables, whole grains, good sources of protein such as meat, eggs, or tofu, and low-fat dairy. Your health care provider will help you determine the amount of weight gain that is right for you.  Avoid raw meat and uncooked cheese. These carry germs that can cause birth defects in the baby.  If you have low calcium intake from food, talk to your health care provider about whether you should take a daily calcium supplement.  Limit foods that are high in fat and processed sugars, such as fried and sweet foods.  To prevent constipation: ? Drink enough fluid to keep  your urine clear or pale yellow. ? Eat foods that are high in fiber, such as fresh fruits and vegetables, whole grains, and beans. Activity  Exercise only as directed by your health care provider. Most women can continue their usual exercise routine during pregnancy. Try to exercise for 30 minutes at least 5 days a week. Stop exercising if you experience uterine contractions.  Avoid heavy lifting, wear low heel shoes, and practice good posture.  A sexual relationship may be continued unless your health care provider directs you otherwise. Relieving pain and discomfort  Wear a good support bra to prevent discomfort from breast tenderness.  Take warm sitz baths to soothe any pain or discomfort caused by hemorrhoids. Use hemorrhoid cream if your health care provider approves.  Rest with your legs elevated if you have leg cramps or low back pain.  If you develop varicose veins, wear support hose. Elevate your feet for 15 minutes, 3-4 times  a day. Limit salt in your diet. Prenatal Care  Write down your questions. Take them to your prenatal visits.  Keep all your prenatal visits as told by your health care provider. This is important. Safety  Wear your seat belt at all times when driving.  Make a list of emergency phone numbers, including numbers for family, friends, the hospital, and police and fire departments. General instructions  Ask your health care provider for a referral to a local prenatal education class. Begin classes no later than the beginning of month 6 of your pregnancy.  Ask for help if you have counseling or nutritional needs during pregnancy. Your health care provider can offer advice or refer you to specialists for help with various needs.  Do not use hot tubs, steam rooms, or saunas.  Do not douche or use tampons or scented sanitary pads.  Do not cross your legs for long periods of time.  Avoid cat litter boxes and soil used by cats. These carry germs that can  cause birth defects in the baby and possibly loss of the fetus by miscarriage or stillbirth.  Avoid all smoking, herbs, alcohol, and unprescribed drugs. Chemicals in these products can affect the formation and growth of the baby.  Do not use any products that contain nicotine or tobacco, such as cigarettes and e-cigarettes. If you need help quitting, ask your health care provider.  Visit your dentist if you have not gone yet during your pregnancy. Use a soft toothbrush to brush your teeth and be gentle when you floss. Contact a health care provider if:  You have dizziness.  You have mild pelvic cramps, pelvic pressure, or nagging pain in the abdominal area.  You have persistent nausea, vomiting, or diarrhea.  You have a bad smelling vaginal discharge.  You have pain when you urinate. Get help right away if:  You have a fever.  You are leaking fluid from your vagina.  You have spotting or bleeding from your vagina.  You have severe abdominal cramping or pain.  You have rapid weight gain or weight loss.  You have shortness of breath with chest pain.  You notice sudden or extreme swelling of your face, hands, ankles, feet, or legs.  You have not felt your baby move in over an hour.  You have severe headaches that do not go away when you take medicine.  You have vision changes. Summary  The second trimester is from week 14 through week 27 (months 4 through 6). It is also a time when the fetus is growing rapidly.  Your body goes through many changes during pregnancy. The changes vary from woman to woman.  Avoid all smoking, herbs, alcohol, and unprescribed drugs. These chemicals affect the formation and growth your baby.  Do not use any tobacco products, such as cigarettes, chewing tobacco, and e-cigarettes. If you need help quitting, ask your health care provider.  Contact your health care provider if you have any questions. Keep all prenatal visits as told by your health  care provider. This is important. This information is not intended to replace advice given to you by your health care provider. Make sure you discuss any questions you have with your health care provider. Document Released: 01/25/2001 Document Revised: 07/09/2015 Document Reviewed: 04/03/2012 Elsevier Interactive Patient Education  2017 [employer].

## 2017-10-26 ENCOUNTER — Ambulatory Visit (INDEPENDENT_AMBULATORY_CARE_PROVIDER_SITE_OTHER): Payer: Medicaid Other

## 2017-10-26 VITALS — BP 124/68 | HR 98 | Ht 64.0 in | Wt 134.0 lb

## 2017-10-26 DIAGNOSIS — O09212 Supervision of pregnancy with history of pre-term labor, second trimester: Secondary | ICD-10-CM | POA: Diagnosis not present

## 2017-10-26 DIAGNOSIS — Z331 Pregnant state, incidental: Secondary | ICD-10-CM

## 2017-10-26 DIAGNOSIS — Z1389 Encounter for screening for other disorder: Secondary | ICD-10-CM

## 2017-10-26 DIAGNOSIS — Z3482 Encounter for supervision of other normal pregnancy, second trimester: Secondary | ICD-10-CM

## 2017-10-26 DIAGNOSIS — O09892 Supervision of other high risk pregnancies, second trimester: Secondary | ICD-10-CM

## 2017-10-26 LAB — POCT URINALYSIS DIPSTICK OB
Glucose, UA: NEGATIVE
Ketones, UA: NEGATIVE
LEUKOCYTES UA: NEGATIVE
NITRITE UA: NEGATIVE
PROTEIN: NEGATIVE
RBC UA: NEGATIVE

## 2017-10-26 MED ORDER — HYDROXYPROGESTERONE CAPROATE 275 MG/1.1ML ~~LOC~~ SOAJ
275.0000 mg | Freq: Once | SUBCUTANEOUS | Status: AC
  Administered 2017-10-26: 275 mg via SUBCUTANEOUS

## 2017-10-26 NOTE — Progress Notes (Signed)
Pt here for first Makena 275 mg IM given lt arm SQ. Tolerated well. Return 1 week for next injection. Pad CMA

## 2017-11-02 ENCOUNTER — Ambulatory Visit (INDEPENDENT_AMBULATORY_CARE_PROVIDER_SITE_OTHER): Payer: Medicaid Other

## 2017-11-02 VITALS — BP 113/62 | HR 90 | Ht 64.0 in | Wt 131.0 lb

## 2017-11-02 DIAGNOSIS — O09212 Supervision of pregnancy with history of pre-term labor, second trimester: Secondary | ICD-10-CM | POA: Diagnosis not present

## 2017-11-02 DIAGNOSIS — Z331 Pregnant state, incidental: Secondary | ICD-10-CM

## 2017-11-02 DIAGNOSIS — Z1389 Encounter for screening for other disorder: Secondary | ICD-10-CM

## 2017-11-02 DIAGNOSIS — O09892 Supervision of other high risk pregnancies, second trimester: Secondary | ICD-10-CM

## 2017-11-02 LAB — POCT URINALYSIS DIPSTICK OB
Blood, UA: NEGATIVE
GLUCOSE, UA: NEGATIVE
Ketones, UA: NEGATIVE
Leukocytes, UA: NEGATIVE
Nitrite, UA: NEGATIVE
POC,PROTEIN,UA: NEGATIVE

## 2017-11-02 MED ORDER — HYDROXYPROGESTERONE CAPROATE 275 MG/1.1ML ~~LOC~~ SOAJ
275.0000 mg | Freq: Once | SUBCUTANEOUS | Status: AC
  Administered 2017-11-02: 275 mg via SUBCUTANEOUS

## 2017-11-02 NOTE — Progress Notes (Signed)
Pt here 17p injection given rt arm SQ. Tolerated well.return 1 week for next injection.Pad CMA

## 2017-11-07 ENCOUNTER — Ambulatory Visit (INDEPENDENT_AMBULATORY_CARE_PROVIDER_SITE_OTHER): Payer: Medicaid Other

## 2017-11-07 ENCOUNTER — Ambulatory Visit (INDEPENDENT_AMBULATORY_CARE_PROVIDER_SITE_OTHER): Payer: Medicaid Other | Admitting: Advanced Practice Midwife

## 2017-11-07 ENCOUNTER — Encounter: Payer: Self-pay | Admitting: Advanced Practice Midwife

## 2017-11-07 VITALS — BP 105/61 | HR 95

## 2017-11-07 DIAGNOSIS — Z363 Encounter for antenatal screening for malformations: Secondary | ICD-10-CM | POA: Diagnosis not present

## 2017-11-07 DIAGNOSIS — Z3482 Encounter for supervision of other normal pregnancy, second trimester: Secondary | ICD-10-CM

## 2017-11-07 DIAGNOSIS — O09219 Supervision of pregnancy with history of pre-term labor, unspecified trimester: Secondary | ICD-10-CM

## 2017-11-07 DIAGNOSIS — O09899 Supervision of other high risk pregnancies, unspecified trimester: Secondary | ICD-10-CM

## 2017-11-07 DIAGNOSIS — Z3A17 17 weeks gestation of pregnancy: Secondary | ICD-10-CM

## 2017-11-07 DIAGNOSIS — Z331 Pregnant state, incidental: Secondary | ICD-10-CM

## 2017-11-07 DIAGNOSIS — Z1389 Encounter for screening for other disorder: Secondary | ICD-10-CM

## 2017-11-07 LAB — POCT URINALYSIS DIPSTICK OB
GLUCOSE, UA: NEGATIVE
KETONES UA: NEGATIVE
Leukocytes, UA: NEGATIVE
Nitrite, UA: NEGATIVE
POC,PROTEIN,UA: NEGATIVE
RBC UA: NEGATIVE

## 2017-11-07 NOTE — Progress Notes (Signed)
.    [id number] [redacted]w[redacted]d Estimated Date of Delivery: 04/12/18  Blood pressure 105/61, pulse 95, not currently breastfeeding.   BP weight and urine results all reviewed and noted.  Please refer to the obstetrical flow sheet for the fundal height and fetal heart rate documentation:  Patient denies any bleeding and no rupture of membranes symptoms or regular contractions. Patient is without complaints. All questions were answered.   Physical Assessment:   Vitals:   11/07/17 1457  BP: 105/61  Pulse: 95  There is no height or weight on file to calculate BMI.        Physical Examination:   General appearance: Well appearing, and in no distress  Mental status: Alert, oriented to person, place, and time  Skin: Warm & dry  Cardiovascular: Normal heart rate noted  Respiratory: Normal respiratory effort, no distress  Abdomen: Soft, gravid, nontender  Pelvic: Cervical exam deferred         Extremities:    Fetal Status:         [place] 17+5 wks,breech/transverse right,cx 4.3 cm,posterior pl gr 0,normal ovaries bilat,fhr 142 bpm,svp of fluid 4.5 cm,EFW 194 g 28%,anatomy complete,no obvious abnormalities   Results for orders placed or performed in visit on 11/07/17 (from the past 24 hour(s))  POC Urinalysis Dipstick OB   Collection Time: 11/07/17  3:01 PM  Result Value Ref Range   Color, UA     Clarity, UA     Glucose, UA Negative Negative   Bilirubin, UA     Ketones, UA neg    Spec Grav, UA     Blood, UA neg    pH, UA     POC Protein UA Negative Negative, Trace   Urobilinogen, UA     Nitrite, UA neg    Leukocytes, UA Negative Negative   Appearance     Odor       Orders Placed This Encounter  Procedures  . POC Urinalysis Dipstick OB    Plan:  Continued routine obstetrical care,   Return in about 4 weeks (around 12/05/2017) for HROB, 17P Weekly.

## 2017-11-07 NOTE — Progress Notes (Signed)
[place] 17+5 wks,breech/transverse right,cx 4.3 cm,posterior pl gr 0,normal ovaries bilat,fhr 142 bpm,svp of fluid 4.5 cm,EFW 194 g 28%,anatomy complete,no obvious abnormalities

## 2017-11-08 LAB — PMP SCREEN PROFILE (10S), URINE
AMPHETAMINE SCREEN URINE: NEGATIVE ng/mL
BARBITURATE SCREEN URINE: NEGATIVE ng/mL
BENZODIAZEPINE SCREEN, URINE: NEGATIVE ng/mL
CANNABINOIDS UR QL SCN: NEGATIVE ng/mL
Cocaine (Metab) Scrn, Ur: NEGATIVE ng/mL
Creatinine(Crt), U: 237.3 mg/dL (ref 20.0–300.0)
Methadone Screen, Urine: NEGATIVE ng/mL
OPIATE SCREEN URINE: NEGATIVE ng/mL
OXYCODONE+OXYMORPHONE UR QL SCN: NEGATIVE ng/mL
PHENCYCLIDINE QUANTITATIVE URINE: NEGATIVE ng/mL
Ph of Urine: 7.3 (ref 4.5–8.9)
Propoxyphene Scrn, Ur: NEGATIVE ng/mL

## 2017-11-09 LAB — URINE CULTURE

## 2017-11-09 LAB — GC/CHLAMYDIA PROBE AMP
CHLAMYDIA, DNA PROBE: NEGATIVE
Neisseria gonorrhoeae by PCR: NEGATIVE

## 2017-11-16 ENCOUNTER — Encounter: Payer: Self-pay | Admitting: *Deleted

## 2017-11-16 ENCOUNTER — Ambulatory Visit: Payer: Medicaid Other | Admitting: *Deleted

## 2017-11-17 NOTE — Progress Notes (Signed)
Patient not in waiting room when called back.

## 2017-11-21 ENCOUNTER — Ambulatory Visit: Payer: Medicaid Other

## 2017-11-23 ENCOUNTER — Other Ambulatory Visit: Payer: Self-pay

## 2017-11-23 ENCOUNTER — Encounter: Payer: Self-pay | Admitting: *Deleted

## 2017-11-23 ENCOUNTER — Encounter (INDEPENDENT_AMBULATORY_CARE_PROVIDER_SITE_OTHER): Payer: Self-pay

## 2017-11-23 ENCOUNTER — Ambulatory Visit (INDEPENDENT_AMBULATORY_CARE_PROVIDER_SITE_OTHER): Payer: Medicaid Other | Admitting: *Deleted

## 2017-11-23 VITALS — BP 139/79 | HR 107 | Wt 137.0 lb

## 2017-11-23 DIAGNOSIS — Z8751 Personal history of pre-term labor: Secondary | ICD-10-CM

## 2017-11-23 DIAGNOSIS — Z1389 Encounter for screening for other disorder: Secondary | ICD-10-CM

## 2017-11-23 DIAGNOSIS — Z331 Pregnant state, incidental: Secondary | ICD-10-CM

## 2017-11-23 LAB — POCT URINALYSIS DIPSTICK OB
Blood, UA: NEGATIVE
Glucose, UA: NEGATIVE
KETONES UA: NEGATIVE
Leukocytes, UA: NEGATIVE
NITRITE UA: NEGATIVE
PROTEIN: NEGATIVE

## 2017-11-23 MED ORDER — HYDROXYPROGESTERONE CAPROATE 275 MG/1.1ML ~~LOC~~ SOAJ
275.0000 mg | Freq: Once | SUBCUTANEOUS | Status: AC
  Administered 2017-11-23: 275 mg via SUBCUTANEOUS

## 2017-11-23 NOTE — Progress Notes (Signed)
Pt given Makena 275mg  SQ left arm without complications. Advised to return in 1 week for next injection. Denies vaginal bleeding, LOF, contractions. Has felt some baby movement.

## 2017-11-24 ENCOUNTER — Telehealth: Payer: Self-pay | Admitting: *Deleted

## 2017-11-24 MED ORDER — CITRANATAL ASSURE 35-1 & 300 MG PO MISC: ORAL | 11 refills | Status: DC

## 2017-11-24 NOTE — Telephone Encounter (Signed)
Pt is requesting Rx for PNV. Samples given until Rx could be sent in.  Advised that I would send her request to a provider and she could check with her pharmacy in the next 24 hours.  [facility] samples x 10 boxes Lot [id number] Exp 12/19

## 2017-11-30 ENCOUNTER — Ambulatory Visit: Payer: Medicaid Other

## 2017-12-01 ENCOUNTER — Encounter: Payer: Self-pay | Admitting: *Deleted

## 2017-12-01 ENCOUNTER — Ambulatory Visit (INDEPENDENT_AMBULATORY_CARE_PROVIDER_SITE_OTHER): Payer: Medicaid Other | Admitting: *Deleted

## 2017-12-01 VITALS — BP 123/74 | HR 95

## 2017-12-01 DIAGNOSIS — O09219 Supervision of pregnancy with history of pre-term labor, unspecified trimester: Secondary | ICD-10-CM | POA: Diagnosis not present

## 2017-12-01 DIAGNOSIS — O09899 Supervision of other high risk pregnancies, unspecified trimester: Secondary | ICD-10-CM

## 2017-12-01 MED ORDER — HYDROXYPROGESTERONE CAPROATE 275 MG/1.1ML ~~LOC~~ SOAJ
275.0000 mg | Freq: Once | SUBCUTANEOUS | Status: AC
  Administered 2017-12-01: 275 mg via SUBCUTANEOUS

## 2017-12-01 NOTE — Addendum Note (Signed)
Addended by: Octaviano Glow on: 12/01/2017 01:52 PM   Modules accepted: Level of Service

## 2017-12-01 NOTE — Progress Notes (Signed)
17P given in right arm with no complications. Pt to return next week for injection. Urine sample not provided. Pt stated "I went while I was at the [employer]

## 2017-12-05 ENCOUNTER — Encounter: Payer: Self-pay | Admitting: Obstetrics and Gynecology

## 2017-12-05 ENCOUNTER — Other Ambulatory Visit: Payer: Self-pay

## 2017-12-05 ENCOUNTER — Encounter (HOSPITAL_COMMUNITY): Payer: Self-pay

## 2017-12-05 ENCOUNTER — Inpatient Hospital Stay (HOSPITAL_COMMUNITY)
Admission: AD | Admit: 2017-12-05 | Discharge: 2017-12-05 | Disposition: A | Discharge status: Home / Self Care | Payer: Medicaid Other | Source: Ambulatory Visit | Attending: Obstetrics and Gynecology | Admitting: Obstetrics and Gynecology

## 2017-12-05 ENCOUNTER — Ambulatory Visit (INDEPENDENT_AMBULATORY_CARE_PROVIDER_SITE_OTHER): Payer: Medicaid Other | Admitting: Obstetrics and Gynecology

## 2017-12-05 VITALS — BP 126/72 | HR 88 | Wt 137.4 lb

## 2017-12-05 DIAGNOSIS — F419 Anxiety disorder, unspecified: Secondary | ICD-10-CM | POA: Diagnosis not present

## 2017-12-05 DIAGNOSIS — Z79899 Other long term (current) drug therapy: Secondary | ICD-10-CM | POA: Diagnosis not present

## 2017-12-05 DIAGNOSIS — Z1389 Encounter for screening for other disorder: Secondary | ICD-10-CM

## 2017-12-05 DIAGNOSIS — Z3A21 21 weeks gestation of pregnancy: Secondary | ICD-10-CM

## 2017-12-05 DIAGNOSIS — O26892 Other specified pregnancy related conditions, second trimester: Secondary | ICD-10-CM | POA: Diagnosis present

## 2017-12-05 DIAGNOSIS — F329 Major depressive disorder, single episode, unspecified: Secondary | ICD-10-CM | POA: Insufficient documentation

## 2017-12-05 DIAGNOSIS — O98312 Other infections with a predominantly sexual mode of transmission complicating pregnancy, second trimester: Secondary | ICD-10-CM | POA: Insufficient documentation

## 2017-12-05 DIAGNOSIS — O99332 Smoking (tobacco) complicating pregnancy, second trimester: Secondary | ICD-10-CM | POA: Diagnosis not present

## 2017-12-05 DIAGNOSIS — A599 Trichomoniasis, unspecified: Secondary | ICD-10-CM | POA: Diagnosis not present

## 2017-12-05 DIAGNOSIS — F17210 Nicotine dependence, cigarettes, uncomplicated: Secondary | ICD-10-CM | POA: Insufficient documentation

## 2017-12-05 DIAGNOSIS — O99342 Other mental disorders complicating pregnancy, second trimester: Secondary | ICD-10-CM | POA: Insufficient documentation

## 2017-12-05 DIAGNOSIS — R102 Pelvic and perineal pain: Secondary | ICD-10-CM | POA: Diagnosis not present

## 2017-12-05 DIAGNOSIS — Z8249 Family history of ischemic heart disease and other diseases of the circulatory system: Secondary | ICD-10-CM | POA: Diagnosis not present

## 2017-12-05 DIAGNOSIS — Z331 Pregnant state, incidental: Secondary | ICD-10-CM

## 2017-12-05 DIAGNOSIS — O09212 Supervision of pregnancy with history of pre-term labor, second trimester: Secondary | ICD-10-CM

## 2017-12-05 DIAGNOSIS — O162 Unspecified maternal hypertension, second trimester: Secondary | ICD-10-CM | POA: Insufficient documentation

## 2017-12-05 DIAGNOSIS — Z3492 Encounter for supervision of normal pregnancy, unspecified, second trimester: Secondary | ICD-10-CM

## 2017-12-05 DIAGNOSIS — Z3482 Encounter for supervision of other normal pregnancy, second trimester: Secondary | ICD-10-CM

## 2017-12-05 DIAGNOSIS — O98812 Other maternal infectious and parasitic diseases complicating pregnancy, second trimester: Secondary | ICD-10-CM

## 2017-12-05 DIAGNOSIS — A5901 Trichomonal vulvovaginitis: Secondary | ICD-10-CM

## 2017-12-05 DIAGNOSIS — Z609 Problem related to social environment, unspecified: Secondary | ICD-10-CM | POA: Insufficient documentation

## 2017-12-05 LAB — URINALYSIS, ROUTINE W REFLEX MICROSCOPIC
Bilirubin Urine: NEGATIVE
GLUCOSE, UA: NEGATIVE mg/dL
Hgb urine dipstick: NEGATIVE
Ketones, ur: NEGATIVE mg/dL
Leukocytes, UA: NEGATIVE
Nitrite: NEGATIVE
Protein, ur: NEGATIVE mg/dL
SPECIFIC GRAVITY, URINE: 1.005 (ref 1.005–1.030)
pH: 6 (ref 5.0–8.0)

## 2017-12-05 LAB — WET PREP, GENITAL
Clue Cells Wet Prep HPF POC: NONE SEEN
Yeast Wet Prep HPF POC: NONE SEEN

## 2017-12-05 MED ORDER — METRONIDAZOLE 500 MG PO TABS
2000.0000 mg | ORAL_TABLET | Freq: Once | ORAL | Status: AC
  Administered 2017-12-05: 2000 mg via ORAL
  Filled 2017-12-05: qty 4

## 2017-12-05 NOTE — MAU Provider Note (Signed)
History     CSN: [id number]  Arrival date and time: 12/05/17 1257   First Provider Initiated Contact with Patient 12/05/17 1416      Chief Complaint  Patient presents with  . Pelvic Pain   HPI  Kristin Simmons is a 32 y.o. [id number] at [redacted]w[redacted]d who presents to MAU with chief complaint of abdominal pressure for the past three weeks. Patient states she feels a "lightning throb" in her vagina as she changes positions or during her work day when she is walking for multiple hours. Patient works third shift and states she is unable to rest during her work shift and spends her time off "running up and down stairs doing all the chores" for her household.   Denies vaginal bleeding, leaking of fluid, decreased fetal movement, fever, falls, or recent illness.    Patient Active Problem List   Diagnosis Date Noted  . Trichomoniasis 12/05/2017  . Supervision of normal pregnancy 10/19/2017  . History of cocaine use 10/19/2017  . Abnormal Pap smear of cervix 01/18/2016  . Smoker 01/13/2016  . Depression 01/13/2016  . Alcohol use affecting pregnancy, first trimester 01/13/2016  . History of preterm delivery, currently pregnant 01/13/2016  . Underweight 01/13/2016    OB History    Gravida  6   Para  4   Term  1   Preterm  3   AB  1   Living  4     SAB  1   TAB      Ectopic      Multiple      Live Births  4           Past Medical History:  Diagnosis Date  . Anemia   . Anxiety   . Cancer (HCC)    cervical  . Depression   . Hypertension   . Vaginal Pap smear, abnormal     Past Surgical History:  Procedure Laterality Date  . NO PAST SURGERIES      Family History  Problem Relation Age of Onset  . Alzheimer's disease Paternal Grandmother   . Cancer Maternal Grandmother   . Cancer Maternal Grandfather   . Hypertension Father   . Anemia Mother   . Hypertension Mother   . Thyroid disease Mother   . Diabetes Sister   . Hypertension Sister   . Mental illness  Brother   . Asthma Daughter   . Bronchitis Daughter   . Asthma Daughter   . Bronchitis Daughter   . Asthma Daughter   . Bronchitis Daughter     Social History   Tobacco Use  . Smoking status: Current Every Day Smoker    Packs/day: 0.25    Years: 15.00    Pack years: 3.75    Types: Cigarettes  . Smokeless tobacco: Never Used  Substance Use Topics  . Alcohol use: Not Currently    Alcohol/week: 1.0 - 2.0 standard drinks    Types: 1 - 2 Cans of beer per week  . Drug use: No    Types: Marijuana    Comment: last use 2007    Allergies:  Allergies  Allergen Reactions  . Latex Itching and Rash    Medications Prior to Admission  Medication Sig Dispense Refill Last Dose  . Doxylamine-Pyridoxine (DICLEGIS) 10-10 MG TBEC 2 tabs q hs, if sx persist add 1 tab q am on day 3, if sx persist add 1 tab q afternoon on day 4 100 tablet 6 Taking  . Prenat  w/o A-FeCbGl-DSS-FA-DHA (CITRANATAL ASSURE) 35-1 & 300 MG tablet One tablet and one capsule daily (Patient not taking: Reported on 12/01/2017) 60 tablet 11 Not Taking  . prenatal vitamin w/FE, FA (PRENATAL 1 + 1) 27-1 MG TABS tablet Take 1 tablet by mouth daily at 12 noon. 30 each 11 Taking  . QUEtiapine (SEROQUEL) 100 MG tablet Take 100 mg by mouth at bedtime.   Not Taking  . sertraline (ZOLOFT) 100 MG tablet Take 100 mg by mouth daily.   Not Taking    Review of Systems  Constitutional: Negative for chills, fatigue and fever.  Respiratory: Negative for shortness of breath.   Gastrointestinal: Negative for abdominal pain, nausea and vomiting.       Denies abdominal cramps, endorses pressure  Genitourinary: Positive for vaginal pain. Negative for difficulty urinating, dyspareunia, flank pain, pelvic pain, vaginal bleeding and vaginal discharge.  Musculoskeletal: Negative for back pain.  Neurological: Negative for dizziness and headaches.  All other systems reviewed and are negative.  Physical Exam   Blood pressure 125/69, pulse 96,  temperature 98.4 F (36.9 C), resp. rate 18, height 5\' 4"  (1.626 m), weight 62.1 kg, not currently breastfeeding.  Physical Exam  Nursing note and vitals reviewed. Constitutional: She is oriented to person, place, and time. She appears well-developed and well-nourished.  Respiratory: Effort normal and breath sounds normal.  GI: Soft. She exhibits no distension. There is no tenderness. There is no rebound and no CVA tenderness.  Gravid  Musculoskeletal: Normal range of motion.  Neurological: She is alert and oriented to person, place, and time. She has normal reflexes.  Skin: Skin is warm.  Psychiatric: She has a normal mood and affect. Her behavior is normal. Judgment and thought content normal.    MAU Course  Procedures  MDM  --Patient unable to stay for 20 minutes of evaluation following Flagyl administration due to child care issue --Likely pubic symphysis pain combined with round ligament pain. Discussed that I cannot write patient out of work for these complaints  Patient Vitals for the past 24 hrs:  BP Temp Pulse Resp Height Weight  12/05/17 1429 125/69 - - - - -  12/05/17 1327 119/73 98.4 F (36.9 C) 96 18 - -  12/05/17 1315 - - - - 5\' 4"  (1.626 m) 62.1 kg    Results for orders placed or performed during the hospital encounter of 12/05/17 (from the past 24 hour(s))  Urinalysis, Routine w reflex microscopic     Status: Abnormal   Collection Time: 12/05/17  1:40 PM  Result Value Ref Range   Color, Urine AMBER (A) YELLOW   APPearance CLOUDY (A) CLEAR   Specific Gravity, Urine 1.005 1.005 - 1.030   pH 6.0 5.0 - 8.0   Glucose, UA NEGATIVE NEGATIVE mg/dL   Hgb urine dipstick NEGATIVE NEGATIVE   Bilirubin Urine NEGATIVE NEGATIVE   Ketones, ur NEGATIVE NEGATIVE mg/dL   Protein, ur NEGATIVE NEGATIVE mg/dL   Nitrite NEGATIVE NEGATIVE   Leukocytes, UA NEGATIVE NEGATIVE  Wet prep, genital     Status: Abnormal   Collection Time: 12/05/17  1:47 PM  Result Value Ref Range    Yeast Wet Prep HPF POC NONE SEEN NONE SEEN   Trich, Wet Prep PRESENT (A) NONE SEEN   Clue Cells Wet Prep HPF POC NONE SEEN NONE SEEN   WBC, Wet Prep HPF POC FEW (A) NONE SEEN   Sperm PRESENT     Meds ordered this encounter  Medications  . metroNIDAZOLE (FLAGYL) tablet  2,000 mg   Assessment and Plan  --32 y.o. [id number] at [redacted]w[redacted]d  --FHT 142 by Doppler --Round ligament vs. Pubic symphysis pain. Patient to consider maternity belt, compression socks for work --Hx PTD, compliant with weekly 17P at [facility] --POS Trichomoniasis, treated in MAU. Given paper rx for expedited partner treatment --Discharge home in stable condition  Darlina Rumpf, [place] 12/05/2017, 3:12 PM

## 2017-12-05 NOTE — Discharge Instructions (Signed)

## 2017-12-05 NOTE — Progress Notes (Signed)
HIGH-RISK PREGNANCY VISIT Patient name: Kristin Simmons MRN [id number]  Date of birth: 1985-12-21 Chief Complaint:   Routine Prenatal Visit (went to [facility] today with throbbing pain and diarrhea)  History of Present Illness:   Kristin Simmons is a 32 y.o. [id number] female at [redacted]w[redacted]d with an Estimated Date of Delivery: 04/12/18 being seen today for ongoing management of a high-risk pregnancy complicated byhistory of prior preterm labor. She obtains 17p weekly. She notes that she feels "shooting" pain in her abdominal region, which she is contributing to the round ligament pain. 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 She would like a BTL. She was evaluated today and diagnosed with trich as well as she was given her first treatment of flagyl today.    Today she reports shooting pain to abdomen. Contractions: Not present. Vag. Bleeding: None.  Movement: Present. denies leaking of fluid.  Review of Systems:   Pertinent items are noted in HPI Denies abnormal vaginal discharge w/ itching/odor/irritation, headaches, visual changes, shortness of breath, chest pain, abdominal pain, severe nausea/vomiting, or problems with urination or bowel movements unless otherwise stated above.   Pertinent History Reviewed:  Reviewed past medical,surgical, social, obstetrical and family history.  Reviewed problem list, medications and allergies. Physical Assessment:   Vitals:   12/05/17 1525  BP: 126/72  Pulse: 88  Weight: 137 lb 6.4 oz (62.3 kg)  Body mass index is 23.58 kg/m.           Physical Examination:   General appearance: alert, well appearing, and in no distress  Mental status: alert, oriented to person, place, and time  Skin: warm & dry   Extremities: Edema: None    Cardiovascular: normal heart rate noted  Respiratory: normal respiratory effort, no distress  Abdomen: gravid, soft, non-tender  Pelvic: Vagina  with heavy discharge consistent for trich.  Cervical exam performed. On bedside US, Cervix is on average 3.4 cm in length with funneling above it, below a 2.1 cm cylinder. Cervical length measured 3 times and at 3.6, 3.3, and 3.4 cm.  Fetal Status: Fetal Heart Rate (bpm): 156 Fundal Height: 24 cm Movement: Present    Fetal Surveillance Testing today: Bedside US   Results for orders placed or performed during the hospital encounter of 12/05/17 (from the past 24 hour(s))  Urinalysis, Routine w reflex microscopic   Collection Time: 12/05/17  1:40 PM  Result Value Ref Range   Color, Urine AMBER (A) YELLOW   APPearance CLOUDY (A) CLEAR   Specific Gravity, Urine 1.005 1.005 - 1.030   pH 6.0 5.0 - 8.0   Glucose, UA NEGATIVE NEGATIVE mg/dL   Hgb urine dipstick NEGATIVE NEGATIVE   Bilirubin Urine NEGATIVE NEGATIVE   Ketones, ur NEGATIVE NEGATIVE mg/dL   Protein, ur NEGATIVE NEGATIVE mg/dL   Nitrite NEGATIVE NEGATIVE   Leukocytes, UA NEGATIVE NEGATIVE  Wet prep, genital   Collection Time: 12/05/17  1:47 PM  Result Value Ref Range   Yeast Wet Prep HPF POC NONE SEEN NONE SEEN   Trich, Wet Prep PRESENT (A) NONE SEEN   Clue Cells Wet Prep HPF POC NONE SEEN NONE SEEN   WBC, Wet Prep HPF POC FEW (A) NONE SEEN   Sperm PRESENT     Assessment & Plan:  1) High-risk pregnancy [id number] at [redacted]w[redacted]d with an Estimated Date of Delivery: 04/12/18 , good cervical length but FUNNELLING ABOVE THE 3.5 CM CERVIX.  2)hX PRIOR Pre-term labor, stable with 17p injections weekly  3) Ttrichomoniasis vaginitis, treated today  Meds: No orders of the defined types were placed in this encounter.   Labs/procedures today:   Treatment Plan:  Continue with Flagyl Rx  Reviewed: Preterm labor symptoms and general obstetric precautions including but not limited to vaginal bleeding, contractions, leaking of fluid and fetal movement were reviewed in detail with the patient.  All questions were answered.  Follow-up: No  follow-ups on file.  Orders Placed This Encounter  Procedures  . POC Urinalysis Dipstick OB   Jonnie Kind, MD 12/05/2017 4:02 PM  By signing my name below, I, Soijett Blue, attest that this documentation has been prepared under the direction and in the presence of Jonnie Kind, MD. Electronically Signed: Soijett Blue, Presenter, broadcasting. 12/05/17. 4:02 PM.  I personally performed the services described in this documentation, which was SCRIBED in my presence. The recorded information has been reviewed and considered accurate. It has been edited as necessary during review. Jonnie Kind, MD

## 2017-12-05 NOTE — MAU Note (Signed)
Flagyl given at 1427, pt was instructed that she needed to stay 20 min following. RN explained reasons for instructing pt to stay in case of reaction pt did not wait full 20 min, provider made aware.

## 2017-12-05 NOTE — Addendum Note (Signed)
Addended by: Linton Rump on: 12/05/2017 04:28 PM   Modules accepted: Orders

## 2017-12-05 NOTE — MAU Note (Addendum)
Pt presents to MAU with c/o pelvic and vaginal pain that started x 3-4 weeks, usually happens while working due to standing. Pt also states that she has had diarrhea x 1 month, 4 times in the last 24 hours. Pt denies VB and LOF. +FM

## 2017-12-07 ENCOUNTER — Ambulatory Visit (INDEPENDENT_AMBULATORY_CARE_PROVIDER_SITE_OTHER): Payer: Medicaid Other

## 2017-12-07 VITALS — BP 124/74 | HR 100 | Ht 64.0 in | Wt 139.0 lb

## 2017-12-07 DIAGNOSIS — O09212 Supervision of pregnancy with history of pre-term labor, second trimester: Secondary | ICD-10-CM

## 2017-12-07 DIAGNOSIS — O09892 Supervision of other high risk pregnancies, second trimester: Secondary | ICD-10-CM

## 2017-12-07 DIAGNOSIS — Z1389 Encounter for screening for other disorder: Secondary | ICD-10-CM

## 2017-12-07 DIAGNOSIS — Z331 Pregnant state, incidental: Secondary | ICD-10-CM

## 2017-12-07 LAB — POCT URINALYSIS DIPSTICK OB
Glucose, UA: NEGATIVE
Leukocytes, UA: NEGATIVE
NITRITE UA: NEGATIVE
RBC UA: NEGATIVE

## 2017-12-07 MED ORDER — HYDROXYPROGESTERONE CAPROATE 275 MG/1.1ML ~~LOC~~ SOAJ
275.0000 mg | Freq: Once | SUBCUTANEOUS | Status: AC
  Administered 2017-12-07: 275 mg via SUBCUTANEOUS

## 2017-12-07 NOTE — Progress Notes (Signed)
Pt here for Makena 275 mg given SQ lt arm. Tolerated well. Return 1 week for next injection.Sample [facility] four box given.Fraser Din CMA

## 2017-12-13 ENCOUNTER — Ambulatory Visit: Payer: Medicaid Other

## 2017-12-13 VITALS — BP 115/72 | HR 84 | Ht 64.0 in | Wt 137.0 lb

## 2017-12-13 DIAGNOSIS — O09892 Supervision of other high risk pregnancies, second trimester: Secondary | ICD-10-CM

## 2017-12-13 DIAGNOSIS — O09212 Supervision of pregnancy with history of pre-term labor, second trimester: Principal | ICD-10-CM

## 2017-12-13 MED ORDER — HYDROXYPROGESTERONE CAPROATE 275 MG/1.1ML ~~LOC~~ SOAJ
275.0000 mg | Freq: Once | SUBCUTANEOUS | Status: AC
  Administered 2017-12-13: 275 mg via SUBCUTANEOUS

## 2017-12-13 NOTE — Progress Notes (Signed)
PT here Makena injection 275 mg SQ rt  Arm. Tolerated well. Return 1 week for next injection. Pad CMA

## 2017-12-18 ENCOUNTER — Other Ambulatory Visit: Payer: Self-pay | Admitting: Obstetrics and Gynecology

## 2017-12-18 DIAGNOSIS — Z0375 Encounter for suspected cervical shortening ruled out: Secondary | ICD-10-CM

## 2017-12-20 ENCOUNTER — Ambulatory Visit (INDEPENDENT_AMBULATORY_CARE_PROVIDER_SITE_OTHER): Payer: Medicaid Other

## 2017-12-20 ENCOUNTER — Encounter: Payer: Self-pay | Admitting: Obstetrics and Gynecology

## 2017-12-20 ENCOUNTER — Ambulatory Visit (INDEPENDENT_AMBULATORY_CARE_PROVIDER_SITE_OTHER): Payer: Medicaid Other | Admitting: Obstetrics and Gynecology

## 2017-12-20 ENCOUNTER — Other Ambulatory Visit: Payer: Self-pay

## 2017-12-20 VITALS — BP 130/73 | HR 86 | Wt 139.0 lb

## 2017-12-20 DIAGNOSIS — Z0375 Encounter for suspected cervical shortening ruled out: Secondary | ICD-10-CM

## 2017-12-20 DIAGNOSIS — Z3A23 23 weeks gestation of pregnancy: Secondary | ICD-10-CM

## 2017-12-20 DIAGNOSIS — O09219 Supervision of pregnancy with history of pre-term labor, unspecified trimester: Secondary | ICD-10-CM

## 2017-12-20 DIAGNOSIS — O09212 Supervision of pregnancy with history of pre-term labor, second trimester: Secondary | ICD-10-CM

## 2017-12-20 DIAGNOSIS — Z3482 Encounter for supervision of other normal pregnancy, second trimester: Secondary | ICD-10-CM

## 2017-12-20 DIAGNOSIS — O09899 Supervision of other high risk pregnancies, unspecified trimester: Secondary | ICD-10-CM

## 2017-12-20 DIAGNOSIS — Z1389 Encounter for screening for other disorder: Secondary | ICD-10-CM

## 2017-12-20 DIAGNOSIS — A599 Trichomoniasis, unspecified: Secondary | ICD-10-CM

## 2017-12-20 DIAGNOSIS — Z331 Pregnant state, incidental: Secondary | ICD-10-CM

## 2017-12-20 DIAGNOSIS — Z3402 Encounter for supervision of normal first pregnancy, second trimester: Secondary | ICD-10-CM

## 2017-12-20 LAB — POCT URINALYSIS DIPSTICK OB
Blood, UA: NEGATIVE
Glucose, UA: NEGATIVE
KETONES UA: NEGATIVE
Leukocytes, UA: NEGATIVE
Nitrite, UA: NEGATIVE
POC,PROTEIN,UA: NEGATIVE

## 2017-12-20 MED ORDER — HYDROXYPROGESTERONE CAPROATE 275 MG/1.1ML ~~LOC~~ SOAJ
275.0000 mg | SUBCUTANEOUS | Status: AC
  Administered 2017-12-20 – 2018-03-05 (×7): 275 mg via SUBCUTANEOUS

## 2017-12-20 NOTE — Progress Notes (Signed)
Pt given makena 275mg  SQ left arm without complications. Advised to return in 1 week for next injection.

## 2017-12-20 NOTE — Assessment & Plan Note (Signed)
Proof of cure______

## 2017-12-20 NOTE — Progress Notes (Signed)
Patient ID: NESA DISTEL, female   DOB: 1985/05/10, 32 y.o.   MRN: [id number]    [facility] PREGNANCY VISIT Patient name: KIERRA JEZEWSKI MRN [id number]  Date of birth: Jan 07, 1986 Chief Complaint:   Routine Prenatal Visit (u/s today; 17P)  History of Present Illness:   MARLIN BRYS is a 32 y.o. [id number] female at [redacted]w[redacted]d with an Estimated Date of Delivery: 04/12/18 being seen today for ongoing management of a high-risk pregnancy complicated by prior preterm delivery. Considering Nexplanon for birthcontrol, had an IUD and didn't like it says she could feel IUD sexually Today she reports no complaints.  . Vag. Bleeding: None.  Movement: Present. denies leaking of fluid.  Review of Systems:   Pertinent items are noted in HPI Denies abnormal vaginal discharge w/ itching/odor/irritation, headaches, visual changes, shortness of breath, chest pain, abdominal pain, severe nausea/vomiting, or problems with urination or bowel movements unless otherwise stated above. Pertinent History Reviewed:  Reviewed past medical,surgical, social, obstetrical and family history.  Reviewed problem list, medications and allergies. Physical Assessment:   Vitals:   12/20/17 1520  BP: 130/73  Pulse: 86  Weight: 139 lb (63 kg)  Body mass index is 23.86 kg/m.           Physical Examination:   General appearance: alert, well appearing, and in no distress and oriented to person, place, and time  Mental status: alert, oriented to person, place, and time, normal mood, behavior, speech, dress, motor activity, and thought processes, affect appropriate to mood  Skin: warm & dry   Extremities: Edema: None    Cardiovascular: normal heart rate noted  Respiratory: normal respiratory effort, no distress  Abdomen: gravid, soft, non-tender  Pelvic: Cervical exam deferred         Fetal Status: Fetal Heart Rate (bpm): 164 u/s Fundal Height: 24 cm Movement: Present    Fetal Surveillance Testing today: [place] 61+6  wks,cephalic,cx length 4.3 cm w/and w/o pressure,posterior placenta gr 1,afi 15 cm,fhr 164 bpm  Results for orders placed or performed in visit on 12/20/17 (from the past 24 hour(s))  POC Urinalysis Dipstick OB   Collection Time: 12/20/17  3:19 PM  Result Value Ref Range   Color, UA     Clarity, UA     Glucose, UA Negative Negative   Bilirubin, UA     Ketones, UA neg    Spec Grav, UA     Blood, UA neg    pH, UA     POC,PROTEIN,UA Negative Negative, Trace   Urobilinogen, UA     Nitrite, UA neg    Leukocytes, UA Negative Negative   Appearance     Odor      Assessment & Plan:  1) High-risk pregnancy [id number] at [redacted]w[redacted]d with an Estimated Date of Delivery: 04/12/18   2) prior at least 1 pre-term delivery, stable, currently on 17 P  Meds:  Meds ordered this encounter  Medications  . HYDROXYprogesterone Caproate SOAJ 275 mg    Labs/procedures today: U/S, 17p injection given  Treatment Plan:   1 week f/u 17p injection 4 week f/u for HROB visit Continue routine obstetrical care  Follow-up: Return in about 1 week (around 12/27/2017) for 17P, then 4 week ob visit.  Orders Placed This Encounter  Procedures  . POC Urinalysis Dipstick OB   By signing my name below, I, Samul Dada, attest that this documentation has been prepared under the direction and in the presence of Jonnie Kind, MD. Electronically Signed: Samul Dada Medical  Scribe. 12/20/17. 3:31 PM.  I personally performed the services described in this documentation, which was SCRIBED in my presence. The recorded information has been reviewed and considered accurate. It has been edited as necessary during review. Jonnie Kind, MD

## 2017-12-20 NOTE — Progress Notes (Signed)
[place] 72+1 wks,cephalic,cx length 4.3 cm w/and w/o pressure,posterior placenta gr 1,afi 15 cm,fhr 164 bpm

## 2017-12-27 ENCOUNTER — Ambulatory Visit (INDEPENDENT_AMBULATORY_CARE_PROVIDER_SITE_OTHER): Payer: Medicaid Other | Admitting: *Deleted

## 2017-12-27 ENCOUNTER — Encounter: Payer: Self-pay | Admitting: *Deleted

## 2017-12-27 ENCOUNTER — Encounter (INDEPENDENT_AMBULATORY_CARE_PROVIDER_SITE_OTHER): Payer: Self-pay

## 2017-12-27 VITALS — BP 119/68 | HR 91 | Ht 64.0 in | Wt 140.0 lb

## 2017-12-27 DIAGNOSIS — Z3482 Encounter for supervision of other normal pregnancy, second trimester: Secondary | ICD-10-CM

## 2017-12-27 DIAGNOSIS — O09212 Supervision of pregnancy with history of pre-term labor, second trimester: Secondary | ICD-10-CM | POA: Diagnosis not present

## 2017-12-27 DIAGNOSIS — Z1389 Encounter for screening for other disorder: Secondary | ICD-10-CM

## 2017-12-27 DIAGNOSIS — Z331 Pregnant state, incidental: Secondary | ICD-10-CM

## 2017-12-27 DIAGNOSIS — O09892 Supervision of other high risk pregnancies, second trimester: Secondary | ICD-10-CM

## 2017-12-27 LAB — POCT URINALYSIS DIPSTICK OB
Blood, UA: NEGATIVE
GLUCOSE, UA: NEGATIVE
Ketones, UA: NEGATIVE
LEUKOCYTES UA: NEGATIVE
Nitrite, UA: NEGATIVE
PROTEIN: NEGATIVE

## 2017-12-27 NOTE — Progress Notes (Signed)
Pt here for 17P. Pt received shot in right arm. Pt tolerated shot. Return in 1 week for next shot. [place]

## 2018-01-03 ENCOUNTER — Encounter (INDEPENDENT_AMBULATORY_CARE_PROVIDER_SITE_OTHER): Payer: Self-pay

## 2018-01-03 ENCOUNTER — Ambulatory Visit: Payer: Medicaid Other | Admitting: *Deleted

## 2018-01-03 DIAGNOSIS — Z8751 Personal history of pre-term labor: Secondary | ICD-10-CM

## 2018-01-03 NOTE — Progress Notes (Signed)
Pt in for 17 P. Refused to leave urine sample. Stated that she is in a hurry.  17 P given  in left arm.

## 2018-01-04 ENCOUNTER — Encounter (HOSPITAL_COMMUNITY): Payer: Self-pay | Admitting: Emergency Medicine

## 2018-01-04 ENCOUNTER — Other Ambulatory Visit: Payer: Self-pay

## 2018-01-04 ENCOUNTER — Emergency Department (HOSPITAL_COMMUNITY): Payer: Medicaid Other

## 2018-01-04 ENCOUNTER — Emergency Department (HOSPITAL_COMMUNITY)
Admission: EM | Admit: 2018-01-04 | Discharge: 2018-01-04 | Disposition: A | Discharge status: Home / Self Care | Payer: Medicaid Other | Attending: Emergency Medicine | Admitting: Emergency Medicine

## 2018-01-04 DIAGNOSIS — F17210 Nicotine dependence, cigarettes, uncomplicated: Secondary | ICD-10-CM | POA: Insufficient documentation

## 2018-01-04 DIAGNOSIS — J189 Pneumonia, unspecified organism: Secondary | ICD-10-CM

## 2018-01-04 DIAGNOSIS — R059 Cough, unspecified: Secondary | ICD-10-CM

## 2018-01-04 DIAGNOSIS — R110 Nausea: Secondary | ICD-10-CM | POA: Diagnosis present

## 2018-01-04 DIAGNOSIS — Z79899 Other long term (current) drug therapy: Secondary | ICD-10-CM | POA: Diagnosis not present

## 2018-01-04 DIAGNOSIS — I10 Essential (primary) hypertension: Secondary | ICD-10-CM | POA: Diagnosis not present

## 2018-01-04 DIAGNOSIS — R197 Diarrhea, unspecified: Secondary | ICD-10-CM | POA: Insufficient documentation

## 2018-01-04 DIAGNOSIS — R05 Cough: Secondary | ICD-10-CM

## 2018-01-04 LAB — URINALYSIS, ROUTINE W REFLEX MICROSCOPIC
Bilirubin Urine: NEGATIVE
Glucose, UA: NEGATIVE mg/dL
HGB URINE DIPSTICK: NEGATIVE
Ketones, ur: NEGATIVE mg/dL
Leukocytes, UA: NEGATIVE
Nitrite: NEGATIVE
Protein, ur: NEGATIVE mg/dL
SPECIFIC GRAVITY, URINE: 1.008 (ref 1.005–1.030)
pH: 7 (ref 5.0–8.0)

## 2018-01-04 LAB — CBC WITH DIFFERENTIAL/PLATELET
Abs Immature Granulocytes: 0.05 10*3/uL (ref 0.00–0.07)
BASOS ABS: 0 10*3/uL (ref 0.0–0.1)
BASOS PCT: 1 %
EOS ABS: 0.1 10*3/uL (ref 0.0–0.5)
Eosinophils Relative: 2 %
HCT: 26.1 % — ABNORMAL LOW (ref 36.0–46.0)
Hemoglobin: 8.6 g/dL — ABNORMAL LOW (ref 12.0–15.0)
IMMATURE GRANULOCYTES: 1 %
Lymphocytes Relative: 30 %
Lymphs Abs: 2.6 10*3/uL (ref 0.7–4.0)
MCH: 29.5 pg (ref 26.0–34.0)
MCHC: 33 g/dL (ref 30.0–36.0)
MCV: 89.4 fL (ref 80.0–100.0)
Monocytes Absolute: 0.5 10*3/uL (ref 0.1–1.0)
Monocytes Relative: 6 %
NEUTROS ABS: 5.4 10*3/uL (ref 1.7–7.7)
NEUTROS PCT: 60 %
NRBC: 0 % (ref 0.0–0.2)
Platelets: 270 10*3/uL (ref 150–400)
RBC: 2.92 MIL/uL — AB (ref 3.87–5.11)
RDW: 12.8 % (ref 11.5–15.5)
WBC: 8.7 10*3/uL (ref 4.0–10.5)

## 2018-01-04 LAB — COMPREHENSIVE METABOLIC PANEL
ALBUMIN: 3.1 g/dL — AB (ref 3.5–5.0)
ALT: 14 U/L (ref 0–44)
AST: 16 U/L (ref 15–41)
Alkaline Phosphatase: 41 U/L (ref 38–126)
Anion gap: 9 (ref 5–15)
CHLORIDE: 107 mmol/L (ref 98–111)
CO2: 22 mmol/L (ref 22–32)
Calcium: 8.8 mg/dL — ABNORMAL LOW (ref 8.9–10.3)
Creatinine, Ser: 0.65 mg/dL (ref 0.44–1.00)
GFR calc Af Amer: >60 mL/min (ref >60)
Glucose, Bld: 99 mg/dL (ref 70–99)
POTASSIUM: 3.7 mmol/L (ref 3.5–5.1)
Sodium: 138 mmol/L (ref 135–145)
Total Bilirubin: 0.7 mg/dL (ref 0.3–1.2)
Total Protein: 6.3 g/dL — ABNORMAL LOW (ref 6.5–8.1)

## 2018-01-04 LAB — POC OCCULT BLOOD, ED: Fecal Occult Bld: NEGATIVE

## 2018-01-04 MED ORDER — SODIUM CHLORIDE 0.9 % IV BOLUS
1000.0000 mL | Freq: Once | INTRAVENOUS | Status: AC
  Administered 2018-01-04: 1000 mL via INTRAVENOUS

## 2018-01-04 MED ORDER — METOCLOPRAMIDE HCL 10 MG PO TABS: 10.0000 mg | ORAL_TABLET | Freq: Four times a day (QID) | ORAL | 0 refills | Status: DC | PRN

## 2018-01-04 MED ORDER — SODIUM CHLORIDE 0.9 % IV SOLN
1.0000 g | Freq: Once | INTRAVENOUS | Status: AC
  Administered 2018-01-04: 1 g via INTRAVENOUS
  Filled 2018-01-04: qty 10

## 2018-01-04 MED ORDER — AZITHROMYCIN 250 MG PO TABS: 250.0000 mg | ORAL_TABLET | Freq: Every day | ORAL | 0 refills | Status: DC

## 2018-01-04 MED ORDER — SODIUM CHLORIDE 0.9 % IV SOLN
INTRAVENOUS | Status: DC | PRN
  Administered 2018-01-04: 250 mL via INTRAVENOUS

## 2018-01-04 NOTE — ED Triage Notes (Signed)
Pt states having n/v/d x 3 weeks and a cough worsening at night x 1 week. Saw OB yesterday and states they did not treated her

## 2018-01-04 NOTE — ED Notes (Signed)
Patient transported to X-ray 

## 2018-01-04 NOTE — ED Provider Notes (Signed)
[facility] EMERGENCY DEPARTMENT Provider Note   CSN: [id number] Arrival date & time: 01/04/18  1216     History   Chief Complaint Chief Complaint  Patient presents with  . Nausea    HPI Kristin Simmons is a 32 y.o. female.  HPI Patient states that she has had 1 week of cough.  She states this is worse at night.  She is exposed to multiple children with similar symptoms.  She denies any fever or chills.  She does have occasional nausea and posttussive emesis.  She has had several weeks of loose stools.  She denies any abdominal pain, vaginal bleeding.  She is roughly 7 months pregnant with seen by her OB/GYN yesterday. Past Medical History:  Diagnosis Date  . Anemia   . Anxiety   . Cancer (HCC)    cervical  . Depression   . Hypertension   . Vaginal Pap smear, abnormal     Patient Active Problem List   Diagnosis Date Noted  . Trichomoniasis 12/05/2017  . High risk social situation  12/05/2017  . Supervision of normal pregnancy 10/19/2017  . History of cocaine use 10/19/2017  . Abnormal Pap smear of cervix 01/18/2016  . Smoker 01/13/2016  . Depression 01/13/2016  . Alcohol use affecting pregnancy, first trimester 01/13/2016  . History of preterm delivery, currently pregnant 01/13/2016  . Underweight 01/13/2016    Past Surgical History:  Procedure Laterality Date  . NO PAST SURGERIES       OB History    Gravida  6   Para  4   Term  1   Preterm  3   AB  1   Living  4     SAB  1   TAB      Ectopic      Multiple      Live Births  4            Home Medications    Prior to Admission medications   Medication Sig Start Date End Date Taking? Authorizing Provider  prenatal vitamin w/FE, FA (PRENATAL 1 + 1) 27-1 MG TABS tablet Take 1 tablet by mouth daily at 12 noon. 12/08/15  Yes Derrek Monaco A, NP  QUEtiapine (SEROQUEL) 100 MG tablet Take 100 mg by mouth at bedtime.   Yes [provider]  sertraline (ZOLOFT) 100 MG tablet  Take 100 mg by mouth daily.   Yes [provider]  azithromycin (ZITHROMAX) 250 MG tablet Take 1 tablet (250 mg total) by mouth daily. Take first 2 tablets together, then 1 every day until finished. 01/04/18   Julianne Rice, MD  metoCLOPramide (REGLAN) 10 MG tablet Take 1 tablet (10 mg total) by mouth every 6 (six) hours as needed for nausea. 01/04/18   Julianne Rice, MD    Family History Family History  Problem Relation Age of Onset  . Alzheimer's disease Paternal Grandmother   . Cancer Maternal Grandmother   . Cancer Maternal Grandfather   . Hypertension Father   . Anemia Mother   . Hypertension Mother   . Thyroid disease Mother   . Diabetes Sister   . Hypertension Sister   . Mental illness Brother   . Asthma Daughter   . Bronchitis Daughter   . Asthma Daughter   . Bronchitis Daughter   . Asthma Daughter   . Bronchitis Daughter     Social History Social History   Tobacco Use  . Smoking status: Current Every Day Smoker  Packs/day: 0.25    Years: 15.00    Pack years: 3.75    Types: Cigarettes  . Smokeless tobacco: Never Used  Substance Use Topics  . Alcohol use: Not Currently    Alcohol/week: 1.0 - 2.0 standard drinks    Types: 1 - 2 Cans of beer per week  . Drug use: No    Types: Marijuana    Comment: last use 2007     Allergies   Latex   Review of Systems Review of Systems  Constitutional: Negative for chills and fever.  HENT: Negative for sore throat and trouble swallowing.   Respiratory: Positive for cough. Negative for shortness of breath.   Cardiovascular: Negative for chest pain, palpitations and leg swelling.  Gastrointestinal: Positive for diarrhea, nausea and vomiting. Negative for abdominal pain, blood in stool and constipation.  Genitourinary: Negative for dysuria, flank pain and frequency.  Musculoskeletal: Negative for back pain, myalgias and neck pain.  Skin: Negative for rash and wound.  Neurological: Negative for dizziness,  weakness, light-headedness, numbness and headaches.  Psychiatric/Behavioral: The patient is nervous/anxious.   All other systems reviewed and are negative.    Physical Exam Updated Vital Signs BP 114/70   Pulse 82   Temp 98.7 F (37.1 C) (Oral)   Resp 18   Ht 5\' 4"  (1.626 m)   Wt 63.5 kg   LMP  (LMP Unknown)   SpO2 100%   BMI 24.03 kg/m   Physical Exam  Constitutional: She is oriented to person, place, and time. She appears well-developed and well-nourished. No distress.  HENT:  Head: Normocephalic and atraumatic.  Mouth/Throat: Oropharynx is clear and moist.  Eyes: Pupils are equal, round, and reactive to light. EOM are normal.  Neck: Normal range of motion. Neck supple.  Cardiovascular: Normal rate and regular rhythm. Exam reveals no gallop and no friction rub.  No murmur heard. Pulmonary/Chest: Effort normal.  Diminished breath sounds in bilateral bases  Abdominal: Soft. Bowel sounds are normal. There is no tenderness. There is no rebound and no guarding.  Gravid nontender abdomen  Musculoskeletal: Normal range of motion. She exhibits no edema or tenderness.  No lower extremity swelling, asymmetry or tenderness.  No midline thoracic or lumbar tenderness.  No CVA tenderness.  Neurological: She is alert and oriented to person, place, and time.  Moving all extremities without focal deficit.  Sensation intact.  Skin: Skin is warm and dry. No rash noted. She is not diaphoretic. No erythema.  Psychiatric: Her behavior is normal.  Anxious  Nursing note and vitals reviewed.    ED Treatments / Results  Labs (all labs ordered are listed, but only abnormal results are displayed) Labs Reviewed  CBC WITH DIFFERENTIAL/PLATELET - Abnormal; Notable for the following components:      Result Value   RBC 2.92 (*)    Hemoglobin 8.6 (*)    HCT 26.1 (*)    All other components within normal limits  COMPREHENSIVE METABOLIC PANEL - Abnormal; Notable for the following components:    BUN <5 (*)    Calcium 8.8 (*)    Total Protein 6.3 (*)    Albumin 3.1 (*)    All other components within normal limits  URINALYSIS, ROUTINE W REFLEX MICROSCOPIC  POC OCCULT BLOOD, ED    EKG None  Radiology Dg Chest 1 View  Result Date: 01/04/2018 CLINICAL DATA:  Cough.  Nausea, vomiting, diarrhea. EXAM: CHEST  1 VIEW COMPARISON:  11/29/2015. FINDINGS: Mediastinum and hilar structures normal. Heart size normal.  Nodular opacity noted over the left lung base. This may represent a nipple shadow. Repeat PA lateral chest x-ray with nipple markers suggested. Mild bibasilar atelectasis/infiltrates. No pleural effusion or pneumothorax. IMPRESSION: 1.  Mild bibasilar atelectasis/infiltrates. 2. Nodular density noted over the left lung base. This may represent a nipple shadow. Repeat PA and lateral chest x-ray suggested with nipple markers. Electronically Signed   By: Marcello Moores  Register   On: 01/04/2018 13:45    Procedures Procedures (including critical care time)  Medications Ordered in ED Medications  0.9 %  sodium chloride infusion (250 mLs Intravenous New Bag/Given 01/04/18 1504)  sodium chloride 0.9 % bolus 1,000 mL (0 mLs Intravenous Stopped 01/04/18 1433)  cefTRIAXone (ROCEPHIN) 1 g in sodium chloride 0.9 % 100 mL IVPB (0 g Intravenous Stopped 01/04/18 1637)     Initial Impression / Assessment and Plan / ED Course  I have reviewed the triage vital signs and the nursing notes.  Pertinent labs & imaging results that were available during my care of the patient were reviewed by me and considered in my medical decision making (see chart for details).     Questionable bibasilar pneumonia on x-ray.  Given patient's respiratory symptoms will treat with antibiotics.  Patient is well-appearing.  No vomiting in the emergency department.  She is anemic.  Negative Hemoccult stool.  This may be related to her pregnancy.  She is advised to follow closely with her OB/GYN.  Return precautions  given.  Final Clinical Impressions(s) / ED Diagnoses   Final diagnoses:  Community acquired pneumonia, unspecified laterality  Nausea  Diarrhea, unspecified type    ED Discharge Orders         Ordered    azithromycin (ZITHROMAX) 250 MG tablet  Daily     01/04/18 1800    metoCLOPramide (REGLAN) 10 MG tablet  Every 6 hours PRN     01/04/18 1800           Julianne Rice, MD 01/04/18 1810

## 2018-01-10 ENCOUNTER — Other Ambulatory Visit: Payer: Self-pay

## 2018-01-10 ENCOUNTER — Ambulatory Visit (INDEPENDENT_AMBULATORY_CARE_PROVIDER_SITE_OTHER): Payer: Medicaid Other | Admitting: *Deleted

## 2018-01-10 ENCOUNTER — Encounter: Payer: Self-pay | Admitting: *Deleted

## 2018-01-10 ENCOUNTER — Encounter (INDEPENDENT_AMBULATORY_CARE_PROVIDER_SITE_OTHER): Payer: Self-pay

## 2018-01-10 VITALS — BP 130/76 | HR 95 | Wt 140.0 lb

## 2018-01-10 DIAGNOSIS — O09899 Supervision of other high risk pregnancies, unspecified trimester: Secondary | ICD-10-CM

## 2018-01-10 DIAGNOSIS — O09219 Supervision of pregnancy with history of pre-term labor, unspecified trimester: Secondary | ICD-10-CM | POA: Diagnosis not present

## 2018-01-10 DIAGNOSIS — Z331 Pregnant state, incidental: Secondary | ICD-10-CM

## 2018-01-10 DIAGNOSIS — Z1389 Encounter for screening for other disorder: Secondary | ICD-10-CM

## 2018-01-10 LAB — POCT URINALYSIS DIPSTICK OB
Glucose, UA: NEGATIVE
KETONES UA: NEGATIVE
Leukocytes, UA: NEGATIVE
NITRITE UA: NEGATIVE
RBC UA: NEGATIVE

## 2018-01-10 NOTE — Progress Notes (Signed)
Pt given Makena 275mg  SQ right arm without complications. Denies vaginal bleeding, LOF, contractions. States baby is moving well. Advised to return in 1 week for next injection.

## 2018-01-17 ENCOUNTER — Encounter: Payer: Medicaid Other | Admitting: Advanced Practice Midwife

## 2018-01-18 ENCOUNTER — Encounter: Payer: Medicaid Other | Admitting: Advanced Practice Midwife

## 2018-01-23 ENCOUNTER — Ambulatory Visit (INDEPENDENT_AMBULATORY_CARE_PROVIDER_SITE_OTHER): Payer: Medicaid Other | Admitting: Obstetrics & Gynecology

## 2018-01-23 VITALS — BP 137/70 | HR 94 | Wt 139.0 lb

## 2018-01-23 DIAGNOSIS — Z3493 Encounter for supervision of normal pregnancy, unspecified, third trimester: Secondary | ICD-10-CM

## 2018-01-23 DIAGNOSIS — Z1389 Encounter for screening for other disorder: Secondary | ICD-10-CM

## 2018-01-23 DIAGNOSIS — Z3A28 28 weeks gestation of pregnancy: Secondary | ICD-10-CM

## 2018-01-23 DIAGNOSIS — Z331 Pregnant state, incidental: Secondary | ICD-10-CM

## 2018-01-23 LAB — POCT URINALYSIS DIPSTICK OB
Blood, UA: NEGATIVE
Glucose, UA: NEGATIVE
KETONES UA: NEGATIVE
Leukocytes, UA: NEGATIVE
NITRITE UA: NEGATIVE
PROTEIN: NEGATIVE

## 2018-01-23 NOTE — Progress Notes (Signed)
   LOW-RISK PREGNANCY VISIT Patient name: Kristin Simmons MRN [id number]  Date of birth: [birth year] Chief Complaint:   Routine Prenatal Visit  History of Present Illness:   Kristin Simmons is a 32 y.o. [id number] female at [redacted]w[redacted]d with an Estimated Date of Delivery: 04/12/18 being seen today for ongoing management of a low-risk pregnancy.  Today she reports no complaints. Contractions: Not present. Vag. Bleeding: None.  Movement: Present. denies leaking of fluid. Review of Systems:   Pertinent items are noted in HPI Denies abnormal vaginal discharge w/ itching/odor/irritation, headaches, visual changes, shortness of breath, chest pain, abdominal pain, severe nausea/vomiting, or problems with urination or bowel movements unless otherwise stated above. Pertinent History Reviewed:  Reviewed past medical,surgical, social, obstetrical and family history.  Reviewed problem list, medications and allergies. Physical Assessment:   Vitals:   01/23/18 1523  BP: 137/70  Pulse: 94  Weight: 139 lb (63 kg)  Body mass index is 23.86 kg/m.        Physical Examination:   General appearance: Well appearing, and in no distress  Mental status: Alert, oriented to person, place, and time  Skin: Warm & dry  Cardiovascular: Normal heart rate noted  Respiratory: Normal respiratory effort, no distress  Abdomen: Soft, gravid, nontender  Pelvic: Cervical exam deferred         Extremities: Edema: None  Fetal Status:     Movement: Present    Results for orders placed or performed in visit on 01/23/18 (from the past 24 hour(s))  POC Urinalysis Dipstick OB   Collection Time: 01/23/18  3:25 PM  Result Value Ref Range   Color, UA     Clarity, UA     Glucose, UA Negative Negative   Bilirubin, UA     Ketones, UA neg    Spec Grav, UA     Blood, UA neg    pH, UA     POC,PROTEIN,UA Negative Negative, Trace, Small (1+), Moderate (2+), Large (3+), 4+   Urobilinogen, UA     Nitrite, UA neg    Leukocytes, UA  Negative Negative   Appearance     Odor      Assessment & Plan:  1) Low-risk pregnancy [id number] at [redacted]w[redacted]d with an Estimated Date of Delivery: 04/12/18   2) History PTD, on 17P weekly   Meds: No orders of the defined types were placed in this encounter.  Labs/procedures today:   Plan:  Continue routine obstetrical care   Reviewed: Preterm labor symptoms and general obstetric precautions including but not limited to vaginal bleeding, contractions, leaking of fluid and fetal movement were reviewed in detail with the patient.  All questions were answered  Follow-up: Return in about 1 week (around 01/30/2018) for PN2 + makena.  Orders Placed This Encounter  Procedures  . POC Urinalysis Dipstick OB   Florian Buff  01/23/2018 3:45 PM

## 2018-01-24 ENCOUNTER — Ambulatory Visit: Payer: Medicaid Other

## 2018-01-29 ENCOUNTER — Other Ambulatory Visit: Payer: Medicaid Other

## 2018-01-29 ENCOUNTER — Encounter: Payer: Medicaid Other | Admitting: Obstetrics & Gynecology

## 2018-01-29 DIAGNOSIS — Z131 Encounter for screening for diabetes mellitus: Secondary | ICD-10-CM

## 2018-01-29 DIAGNOSIS — Z3483 Encounter for supervision of other normal pregnancy, third trimester: Secondary | ICD-10-CM

## 2018-01-29 DIAGNOSIS — Z3A29 29 weeks gestation of pregnancy: Secondary | ICD-10-CM

## 2018-01-30 LAB — CBC
HEMATOCRIT: 28.1 % — AB (ref 34.0–46.6)
Hemoglobin: 9.3 g/dL — ABNORMAL LOW (ref 11.1–15.9)
MCH: 29.7 pg (ref 26.6–33.0)
MCHC: 33.1 g/dL (ref 31.5–35.7)
MCV: 90 fL (ref 79–97)
Platelets: 265 10*3/uL (ref 150–450)
RBC: 3.13 x10E6/uL — ABNORMAL LOW (ref 3.77–5.28)
RDW: 13.1 % (ref 12.3–15.4)
WBC: 7.8 10*3/uL (ref 3.4–10.8)

## 2018-01-30 LAB — ANTIBODY SCREEN: Antibody Screen: NEGATIVE

## 2018-01-30 LAB — GLUCOSE TOLERANCE, 2 HOURS W/ 1HR
Glucose, 1 hour: 130 mg/dL (ref 65–179)
Glucose, 2 hour: 111 mg/dL (ref 65–152)
Glucose, Fasting: 82 mg/dL (ref 65–91)

## 2018-01-30 LAB — HIV ANTIBODY (ROUTINE TESTING W REFLEX): HIV Screen 4th Generation wRfx: NONREACTIVE

## 2018-01-30 LAB — RPR: RPR Ser Ql: NONREACTIVE

## 2018-01-30 NOTE — Progress Notes (Signed)
This encounter was created in error - please disregard.

## 2018-02-02 ENCOUNTER — Encounter: Payer: Medicaid Other | Admitting: Obstetrics & Gynecology

## 2018-02-05 ENCOUNTER — Encounter: Payer: Self-pay | Admitting: Obstetrics & Gynecology

## 2018-02-05 ENCOUNTER — Ambulatory Visit (INDEPENDENT_AMBULATORY_CARE_PROVIDER_SITE_OTHER): Payer: Medicaid Other | Admitting: Obstetrics & Gynecology

## 2018-02-05 VITALS — BP 129/66 | HR 91 | Wt 141.5 lb

## 2018-02-05 DIAGNOSIS — Z331 Pregnant state, incidental: Secondary | ICD-10-CM

## 2018-02-05 DIAGNOSIS — Z3A30 30 weeks gestation of pregnancy: Secondary | ICD-10-CM

## 2018-02-05 DIAGNOSIS — Z3493 Encounter for supervision of normal pregnancy, unspecified, third trimester: Secondary | ICD-10-CM

## 2018-02-05 DIAGNOSIS — Z1389 Encounter for screening for other disorder: Secondary | ICD-10-CM

## 2018-02-05 LAB — POCT URINALYSIS DIPSTICK OB
GLUCOSE, UA: NEGATIVE
Ketones, UA: NEGATIVE
Leukocytes, UA: NEGATIVE
Nitrite, UA: NEGATIVE
POC,PROTEIN,UA: NEGATIVE
RBC UA: NEGATIVE

## 2018-02-05 NOTE — Progress Notes (Signed)
   LOW-RISK PREGNANCY VISIT Patient name: Kristin Simmons MRN [id number]  Date of birth: March 15, 1985 Chief Complaint:   Routine Prenatal Visit  History of Present Illness:   Kristin Simmons is a 32 y.o. [id number] female at 107w4d with an Estimated Date of Delivery: 04/12/18 being seen today for ongoing management of a low-risk pregnancy.  Today she reports no complaints. Contractions: Not present. Vag. Bleeding: None.  Movement: Present. denies leaking of fluid. Review of Systems:   Pertinent items are noted in HPI Denies abnormal vaginal discharge w/ itching/odor/irritation, headaches, visual changes, shortness of breath, chest pain, abdominal pain, severe nausea/vomiting, or problems with urination or bowel movements unless otherwise stated above. Pertinent History Reviewed:  Reviewed past medical,surgical, social, obstetrical and family history.  Reviewed problem list, medications and allergies. Physical Assessment:   Vitals:   02/05/18 1435  BP: 129/66  Pulse: 91  Weight: 141 lb 8 oz (64.2 kg)  Body mass index is 24.29 kg/m.        Physical Examination:   General appearance: Well appearing, and in no distress  Mental status: Alert, oriented to person, place, and time  Skin: Warm & dry  Cardiovascular: Normal heart rate noted  Respiratory: Normal respiratory effort, no distress  Abdomen: Soft, gravid, nontender  Pelvic: Cervical exam deferred         Extremities:    Fetal Status: Fetal Heart Rate (bpm): 147 Fundal Height: 31 cm Movement: Present    Results for orders placed or performed in visit on 02/05/18 (from the past 24 hour(s))  POC Urinalysis Dipstick OB   Collection Time: 02/05/18  2:45 PM  Result Value Ref Range   Color, UA     Clarity, UA     Glucose, UA Negative Negative   Bilirubin, UA     Ketones, UA neg    Spec Grav, UA     Blood, UA neg    pH, UA     POC,PROTEIN,UA Negative Negative, Trace, Small (1+), Moderate (2+), Large (3+), 4+   Urobilinogen, UA       Nitrite, UA neg    Leukocytes, UA Negative Negative   Appearance     Odor      Assessment & Plan:  1) Low-risk pregnancy [id number] at [redacted]w[redacted]d with an Estimated Date of Delivery: 04/12/18   2) wants to sign BTL papers   Meds: No orders of the defined types were placed in this encounter.  Labs/procedures today:   Plan:  Continue routine obstetrical care   Reviewed: Preterm labor symptoms and general obstetric precautions including but not limited to vaginal bleeding, contractions, leaking of fluid and fetal movement were reviewed in detail with the patient.  All questions were answered  Follow-up: Return in about 2 weeks (around 02/19/2018) for [place].  Orders Placed This Encounter  Procedures  . POC Urinalysis Dipstick OB   Mertie Clause Eure  02/05/2018 3:12 PM

## 2018-02-12 ENCOUNTER — Ambulatory Visit: Payer: Medicaid Other

## 2018-02-13 ENCOUNTER — Ambulatory Visit (INDEPENDENT_AMBULATORY_CARE_PROVIDER_SITE_OTHER): Payer: Medicaid Other

## 2018-02-13 ENCOUNTER — Encounter (INDEPENDENT_AMBULATORY_CARE_PROVIDER_SITE_OTHER): Payer: Self-pay

## 2018-02-13 VITALS — BP 130/80 | HR 92 | Ht 64.0 in | Wt 142.0 lb

## 2018-02-13 DIAGNOSIS — Z331 Pregnant state, incidental: Secondary | ICD-10-CM

## 2018-02-13 DIAGNOSIS — O09893 Supervision of other high risk pregnancies, third trimester: Secondary | ICD-10-CM

## 2018-02-13 DIAGNOSIS — Z3493 Encounter for supervision of normal pregnancy, unspecified, third trimester: Secondary | ICD-10-CM

## 2018-02-13 DIAGNOSIS — Z1389 Encounter for screening for other disorder: Secondary | ICD-10-CM

## 2018-02-13 DIAGNOSIS — O09213 Supervision of pregnancy with history of pre-term labor, third trimester: Principal | ICD-10-CM

## 2018-02-13 LAB — POCT URINALYSIS DIPSTICK OB
GLUCOSE, UA: NEGATIVE
Ketones, UA: NEGATIVE
Nitrite, UA: NEGATIVE
RBC UA: NEGATIVE

## 2018-02-13 MED ORDER — HYDROXYPROGESTERONE CAPROATE 275 MG/1.1ML ~~LOC~~ SOAJ
275.0000 mg | Freq: Once | SUBCUTANEOUS | Status: AC
  Administered 2018-02-13: 275 mg via SUBCUTANEOUS

## 2018-02-13 NOTE — Progress Notes (Signed)
PT here for [facility] 275mg  given SQ lt arm. Tolerated well. Return 1 week for next injection.Pad CMA

## 2018-02-14 NOTE — L&D Delivery Note (Signed)
Patient: Kristin Simmons MRN: [id number]  GBS status: negative, IAP given: not indicated  Patient is a 33 y.o. now [id number] s/p NSVD at [redacted]w[redacted]d, who was admitted for SOL/SROM. SROM 5h 22m prior to delivery with clear fluid.    Delivery Note At 6:33 AM a viable female was delivered via Vaginal, Spontaneous (Presentation: cephalic; OA).  APGAR: 9, 9; weight 5 lb 9.4 oz (2535 g).   Placenta status: intact, 3-vessel.  Cord:  with the following complications: none.   Anesthesia: epidural  Episiotomy: None Lacerations: None Suture Repair: None Est. Blood Loss (mL): 0  Mom to postpartum.  Baby to Couplet care / Skin to Skin.  Aura Camps 03/26/2018, 10:08 AM   Head delivered OA. No nuchal cord present. Shoulder and body delivered in usual fashion. Infant with spontaneous cry, placed on mother's abdomen, dried and bulb suctioned. Cord clamped x 2 after 1-minute delay, and cut by family member. Cord blood drawn. Placenta delivered spontaneously with gentle cord traction. Fundus firm with massage and Pitocin. Perineum inspected and found to have no lacerations, with good hemostasis achieved.

## 2018-02-19 ENCOUNTER — Ambulatory Visit (INDEPENDENT_AMBULATORY_CARE_PROVIDER_SITE_OTHER): Payer: Medicaid Other | Admitting: Obstetrics & Gynecology

## 2018-02-19 VITALS — BP 126/76 | HR 94 | Wt 140.0 lb

## 2018-02-19 DIAGNOSIS — Z331 Pregnant state, incidental: Secondary | ICD-10-CM

## 2018-02-19 DIAGNOSIS — Z3A32 32 weeks gestation of pregnancy: Secondary | ICD-10-CM

## 2018-02-19 DIAGNOSIS — Z3483 Encounter for supervision of other normal pregnancy, third trimester: Secondary | ICD-10-CM

## 2018-02-19 DIAGNOSIS — Z1389 Encounter for screening for other disorder: Secondary | ICD-10-CM

## 2018-02-19 LAB — POCT URINALYSIS DIPSTICK OB
Glucose, UA: NEGATIVE
Ketones, UA: NEGATIVE
LEUKOCYTES UA: NEGATIVE
NITRITE UA: NEGATIVE
POC,PROTEIN,UA: NEGATIVE
RBC UA: NEGATIVE

## 2018-02-19 NOTE — Progress Notes (Signed)
   LOW-RISK PREGNANCY VISIT Patient name: MCKINZEY ENTWISTLE MRN [id number]  Date of birth: December 19, 1985 Chief Complaint:   Routine Prenatal Visit (17P)  History of Present Illness:   SHELINA LUO is a 33 y.o. [id number] female at [redacted]w[redacted]d with an Estimated Date of Delivery: 04/12/18 being seen today for ongoing management of a low-risk pregnancy.  Today she reports no complaints. Contractions: Not present. Vag. Bleeding: None.  Movement: Present. denies leaking of fluid. Review of Systems:   Pertinent items are noted in HPI Denies abnormal vaginal discharge w/ itching/odor/irritation, headaches, visual changes, shortness of breath, chest pain, abdominal pain, severe nausea/vomiting, or problems with urination or bowel movements unless otherwise stated above. Pertinent History Reviewed:  Reviewed past medical,surgical, social, obstetrical and family history.  Reviewed problem list, medications and allergies. Physical Assessment:   Vitals:   02/19/18 1501  BP: 126/76  Pulse: 94  Weight: 140 lb (63.5 kg)  Body mass index is 24.03 kg/m.        Physical Examination:   General appearance: Well appearing, and in no distress  Mental status: Alert, oriented to person, place, and time  Skin: Warm & dry  Cardiovascular: Normal heart rate noted  Respiratory: Normal respiratory effort, no distress  Abdomen: Soft, gravid, nontender  Pelvic: Cervical exam deferred         Extremities: Edema: None  Fetal Status: Fetal Heart Rate (bpm): 150 Fundal Height: 33 cm Movement: Present    Results for orders placed or performed in visit on 02/19/18 (from the past 24 hour(s))  POC Urinalysis Dipstick OB   Collection Time: 02/19/18  3:10 PM  Result Value Ref Range   Color, UA     Clarity, UA     Glucose, UA Negative Negative   Bilirubin, UA     Ketones, UA neg    Spec Grav, UA     Blood, UA neg    pH, UA     POC,PROTEIN,UA Negative Negative, Trace, Small (1+), Moderate (2+), Large (3+), 4+   Urobilinogen, UA     Nitrite, UA neg    Leukocytes, UA Negative Negative   Appearance     Odor      Assessment & Plan:  1) Low-risk pregnancy [id number] at [redacted]w[redacted]d with an Estimated Date of Delivery: 04/12/18      Meds: No orders of the defined types were placed in this encounter.  Labs/procedures today:   Plan:  Continue routine obstetrical care   Reviewed: Preterm labor symptoms and general obstetric precautions including but not limited to vaginal bleeding, contractions, leaking of fluid and fetal movement were reviewed in detail with the patient.  All questions were answered  Follow-up: Return in about 2 weeks (around 03/05/2018) for [place].  Orders Placed This Encounter  Procedures  . POC Urinalysis Dipstick OB   Florian Buff  02/19/2018 3:17 PM

## 2018-02-26 ENCOUNTER — Ambulatory Visit (INDEPENDENT_AMBULATORY_CARE_PROVIDER_SITE_OTHER): Payer: Medicaid Other | Admitting: *Deleted

## 2018-02-26 VITALS — BP 131/74 | HR 95 | Wt 142.0 lb

## 2018-02-26 DIAGNOSIS — Z8751 Personal history of pre-term labor: Secondary | ICD-10-CM

## 2018-02-26 NOTE — Progress Notes (Signed)
Makena given SQ in left arm. Patient tolerated well.

## 2018-02-27 ENCOUNTER — Telehealth: Payer: Self-pay | Admitting: *Deleted

## 2018-02-27 NOTE — Telephone Encounter (Signed)
Patient states she is having pain in her leg in which Dr Elonda Husky told her at her last visit she had sciatica.  She is requesting something for the pain as she is having difficulty moving and walking.  Informed patient that sciatica is common during pregnancy and encouraged her to try leaning over her bed or getting on her hands and knees to relieve some of the pain. Patient states she has tried that and it is not helping.  I also informed her that pain medication is typically not prescribed during pregnancy as it crosses the placenta and to the baby.  Patient stated ok and that she would discuss at her next visit.

## 2018-03-05 ENCOUNTER — Ambulatory Visit (INDEPENDENT_AMBULATORY_CARE_PROVIDER_SITE_OTHER): Payer: Medicaid Other | Admitting: Obstetrics & Gynecology

## 2018-03-05 VITALS — BP 121/72 | HR 87 | Wt 139.5 lb

## 2018-03-05 DIAGNOSIS — Z331 Pregnant state, incidental: Secondary | ICD-10-CM

## 2018-03-05 DIAGNOSIS — Z3483 Encounter for supervision of other normal pregnancy, third trimester: Secondary | ICD-10-CM

## 2018-03-05 DIAGNOSIS — Z1389 Encounter for screening for other disorder: Secondary | ICD-10-CM

## 2018-03-05 DIAGNOSIS — Z3A34 34 weeks gestation of pregnancy: Secondary | ICD-10-CM

## 2018-03-05 LAB — POCT URINALYSIS DIPSTICK OB
GLUCOSE, UA: NEGATIVE
Leukocytes, UA: NEGATIVE
Nitrite, UA: NEGATIVE
RBC UA: NEGATIVE

## 2018-03-05 NOTE — Progress Notes (Signed)
   LOW-RISK PREGNANCY VISIT Patient name: Kristin Simmons MRN [id number]  Date of birth: 06/04/1985 Chief Complaint:   Routine Prenatal Visit  History of Present Illness:   Kristin Simmons is a 33 y.o. [id number] female at [redacted]w[redacted]d with an Estimated Date of Delivery: 04/12/18 being seen today for ongoing management of a low-risk pregnancy.  Today she reports backache. Contractions: Not present. Vag. Bleeding: None.  Movement: Present. denies leaking of fluid. Review of Systems:   Pertinent items are noted in HPI Denies abnormal vaginal discharge w/ itching/odor/irritation, headaches, visual changes, shortness of breath, chest pain, abdominal pain, severe nausea/vomiting, or problems with urination or bowel movements unless otherwise stated above. Pertinent History Reviewed:  Reviewed past medical,surgical, social, obstetrical and family history.  Reviewed problem list, medications and allergies. Physical Assessment:   Vitals:   03/05/18 1405  BP: 121/72  Pulse: 87  Weight: 139 lb 8 oz (63.3 kg)  Body mass index is 23.95 kg/m.        Physical Examination:   General appearance: Well appearing, and in no distress  Mental status: Alert, oriented to person, place, and time  Skin: Warm & dry  Cardiovascular: Normal heart rate noted  Respiratory: Normal respiratory effort, no distress  Abdomen: Soft, gravid, nontender  Pelvic: Cervical exam deferred         Extremities: Edema: None  Fetal Status: Fetal Heart Rate (bpm): 135 Fundal Height: 35 cm Movement: Present    Results for orders placed or performed in visit on 03/05/18 (from the past 24 hour(s))  POC Urinalysis Dipstick OB   Collection Time: 03/05/18  2:08 PM  Result Value Ref Range   Color, UA     Clarity, UA     Glucose, UA Negative Negative   Bilirubin, UA     Ketones, UA large    Spec Grav, UA     Blood, UA neg    pH, UA     POC,PROTEIN,UA Small (1+) Negative, Trace, Small (1+), Moderate (2+), Large (3+), 4+   Urobilinogen, UA     Nitrite, UA neg    Leukocytes, UA Negative Negative   Appearance     Odor      Assessment & Plan:  1) Low-risk pregnancy [id number] at [redacted]w[redacted]d with an Estimated Date of Delivery: 04/12/18   2) Hx of PTL/PTD   Meds: No orders of the defined types were placed in this encounter.  Labs/procedures today: 17P  Plan:  Continue routine obstetrical care   Reviewed: Preterm labor symptoms and general obstetric precautions including but not limited to vaginal bleeding, contractions, leaking of fluid and fetal movement were reviewed in detail with the patient.  All questions were answered  Follow-up: Return in about 2 weeks (around 03/19/2018) for 1 week 17P, 2 weeks ob visit, LROB.  Orders Placed This Encounter  Procedures  . POC Urinalysis Dipstick OB   Mertie Clause   03/05/2018 2:18 PM

## 2018-03-12 ENCOUNTER — Ambulatory Visit (INDEPENDENT_AMBULATORY_CARE_PROVIDER_SITE_OTHER): Payer: Medicaid Other

## 2018-03-12 VITALS — BP 127/74 | HR 103 | Ht 64.0 in | Wt 140.6 lb

## 2018-03-12 DIAGNOSIS — Z1389 Encounter for screening for other disorder: Secondary | ICD-10-CM

## 2018-03-12 DIAGNOSIS — Z331 Pregnant state, incidental: Secondary | ICD-10-CM

## 2018-03-12 DIAGNOSIS — O09213 Supervision of pregnancy with history of pre-term labor, third trimester: Secondary | ICD-10-CM

## 2018-03-12 DIAGNOSIS — O09893 Supervision of other high risk pregnancies, third trimester: Secondary | ICD-10-CM

## 2018-03-12 LAB — POCT URINALYSIS DIPSTICK OB
Blood, UA: NEGATIVE
Glucose, UA: NEGATIVE
KETONES UA: NEGATIVE
Leukocytes, UA: NEGATIVE
NITRITE UA: NEGATIVE

## 2018-03-12 MED ORDER — HYDROXYPROGESTERONE CAPROATE 275 MG/1.1ML ~~LOC~~ SOAJ
275.0000 mg | Freq: Once | SUBCUTANEOUS | Status: AC
  Administered 2018-03-12: 275 mg via SUBCUTANEOUS

## 2018-03-12 NOTE — Progress Notes (Signed)
PT here for makena 275 mg SQ rt arm. Tolerated well. Return 1 weeks for next shot. Pad CMA

## 2018-03-19 ENCOUNTER — Encounter: Payer: Self-pay | Admitting: Obstetrics & Gynecology

## 2018-03-19 ENCOUNTER — Ambulatory Visit (INDEPENDENT_AMBULATORY_CARE_PROVIDER_SITE_OTHER): Payer: Medicaid Other | Admitting: Obstetrics & Gynecology

## 2018-03-19 VITALS — BP 124/77 | HR 80 | Wt 142.5 lb

## 2018-03-19 DIAGNOSIS — Z3483 Encounter for supervision of other normal pregnancy, third trimester: Secondary | ICD-10-CM | POA: Diagnosis not present

## 2018-03-19 DIAGNOSIS — Z3A36 36 weeks gestation of pregnancy: Secondary | ICD-10-CM | POA: Diagnosis not present

## 2018-03-19 DIAGNOSIS — Z331 Pregnant state, incidental: Secondary | ICD-10-CM

## 2018-03-19 DIAGNOSIS — Z1389 Encounter for screening for other disorder: Secondary | ICD-10-CM

## 2018-03-19 LAB — POCT URINALYSIS DIPSTICK OB
GLUCOSE, UA: NEGATIVE
Nitrite, UA: NEGATIVE
POC,PROTEIN,UA: NEGATIVE

## 2018-03-19 NOTE — Progress Notes (Signed)
   LOW-RISK PREGNANCY VISIT Patient name: Kristin Simmons MRN [id number]  Date of birth: 1986-01-05 Chief Complaint:   Routine Prenatal Visit (GBS, GC/CHL; having trouble walking)  History of Present Illness:   Kristin Simmons is a 33 y.o. [id number] female at [redacted]w[redacted]d with an Estimated Date of Delivery: 04/12/18 being seen today for ongoing management of a low-risk pregnancy.  Today she reports backache. Contractions: Irregular. Vag. Bleeding: None.  Movement: Present. denies leaking of fluid. Review of Systems:   Pertinent items are noted in HPI Denies abnormal vaginal discharge w/ itching/odor/irritation, headaches, visual changes, shortness of breath, chest pain, abdominal pain, severe nausea/vomiting, or problems with urination or bowel movements unless otherwise stated above. Pertinent History Reviewed:  Reviewed past medical,surgical, social, obstetrical and family history.  Reviewed problem list, medications and allergies. Physical Assessment:   Vitals:   03/19/18 1442  BP: 124/77  Pulse: 80  Weight: 142 lb 8 oz (64.6 kg)  Body mass index is 24.46 kg/m.        Physical Examination:   General appearance: Well appearing, and in no distress  Mental status: Alert, oriented to person, place, and time  Skin: Warm & dry  Cardiovascular: Normal heart rate noted  Respiratory: Normal respiratory effort, no distress  Abdomen: Soft, gravid, nontender  Pelvic: Cervical exam performed  Dilation: 1 Effacement (%): Thick Station: -3FT TH -3  Extremities: Edema: None  Fetal Status: Fetal Heart Rate (bpm): 145 Fundal Height: 35 cm Movement: Present Presentation: Vertex  Results for orders placed or performed in visit on 03/19/18 (from the past 24 hour(s))  POC Urinalysis Dipstick OB   Collection Time: 03/19/18  2:43 PM  Result Value Ref Range   Color, UA     Clarity, UA     Glucose, UA Negative Negative   Bilirubin, UA     Ketones, UA 1+    Spec Grav, UA     Blood, UA trace    pH, UA      POC,PROTEIN,UA Negative Negative, Trace, Small (1+), Moderate (2+), Large (3+), 4+   Urobilinogen, UA     Nitrite, UA neg    Leukocytes, UA Small (1+) (A) Negative   Appearance     Odor      Assessment & Plan:  1) Low-risk pregnancy [id number] at [redacted]w[redacted]d with an Estimated Date of Delivery: 04/12/18      Meds: No orders of the defined types were placed in this encounter.  Labs/procedures today:   Plan:  Continue routine obstetrical care   Reviewed: Term labor symptoms and general obstetric precautions including but not limited to vaginal bleeding, contractions, leaking of fluid and fetal movement were reviewed in detail with the patient.  All questions were answered  Follow-up: Return in about 1 week (around 03/26/2018) for LROB.  Orders Placed This Encounter  Procedures  . GC/Chlamydia Probe Amp  . Strep Gp B NAA  . POC Urinalysis Dipstick OB   Florian Buff 03/19/2018 3:28 PM

## 2018-03-21 LAB — STREP GP B NAA: STREP GROUP B AG: NEGATIVE

## 2018-03-22 LAB — GC/CHLAMYDIA PROBE AMP
CHLAMYDIA, DNA PROBE: NEGATIVE
Neisseria gonorrhoeae by PCR: NEGATIVE

## 2018-03-26 ENCOUNTER — Encounter: Payer: Medicaid Other | Admitting: Women's Health

## 2018-03-26 ENCOUNTER — Inpatient Hospital Stay (HOSPITAL_COMMUNITY): Payer: Medicaid Other | Admitting: Anesthesiology

## 2018-03-26 ENCOUNTER — Encounter (HOSPITAL_COMMUNITY): Payer: Self-pay | Admitting: *Deleted

## 2018-03-26 ENCOUNTER — Inpatient Hospital Stay (HOSPITAL_COMMUNITY)
Admission: EM | Admit: 2018-03-26 | Discharge: 2018-03-28 | DRG: 798 | Disposition: A | Discharge status: Home / Self Care | Payer: Medicaid Other | Attending: Obstetrics and Gynecology | Admitting: Obstetrics and Gynecology

## 2018-03-26 ENCOUNTER — Other Ambulatory Visit: Payer: Self-pay

## 2018-03-26 DIAGNOSIS — O09219 Supervision of pregnancy with history of pre-term labor, unspecified trimester: Secondary | ICD-10-CM

## 2018-03-26 DIAGNOSIS — O134 Gestational [pregnancy-induced] hypertension without significant proteinuria, complicating childbirth: Secondary | ICD-10-CM | POA: Diagnosis present

## 2018-03-26 DIAGNOSIS — R1084 Generalized abdominal pain: Secondary | ICD-10-CM

## 2018-03-26 DIAGNOSIS — O99334 Smoking (tobacco) complicating childbirth: Secondary | ICD-10-CM | POA: Diagnosis present

## 2018-03-26 DIAGNOSIS — O4292 Full-term premature rupture of membranes, unspecified as to length of time between rupture and onset of labor: Secondary | ICD-10-CM | POA: Diagnosis present

## 2018-03-26 DIAGNOSIS — F17210 Nicotine dependence, cigarettes, uncomplicated: Secondary | ICD-10-CM | POA: Diagnosis present

## 2018-03-26 DIAGNOSIS — Z3A37 37 weeks gestation of pregnancy: Secondary | ICD-10-CM

## 2018-03-26 DIAGNOSIS — F419 Anxiety disorder, unspecified: Secondary | ICD-10-CM | POA: Diagnosis present

## 2018-03-26 DIAGNOSIS — O99311 Alcohol use complicating pregnancy, first trimester: Secondary | ICD-10-CM

## 2018-03-26 DIAGNOSIS — O09899 Supervision of other high risk pregnancies, unspecified trimester: Secondary | ICD-10-CM

## 2018-03-26 DIAGNOSIS — F32A Depression, unspecified: Secondary | ICD-10-CM | POA: Diagnosis present

## 2018-03-26 DIAGNOSIS — F1491 Cocaine use, unspecified, in remission: Secondary | ICD-10-CM

## 2018-03-26 DIAGNOSIS — F329 Major depressive disorder, single episode, unspecified: Secondary | ICD-10-CM | POA: Diagnosis present

## 2018-03-26 DIAGNOSIS — O9902 Anemia complicating childbirth: Secondary | ICD-10-CM | POA: Diagnosis present

## 2018-03-26 DIAGNOSIS — F17200 Nicotine dependence, unspecified, uncomplicated: Secondary | ICD-10-CM | POA: Diagnosis present

## 2018-03-26 DIAGNOSIS — D649 Anemia, unspecified: Secondary | ICD-10-CM | POA: Diagnosis present

## 2018-03-26 DIAGNOSIS — A599 Trichomoniasis, unspecified: Secondary | ICD-10-CM | POA: Diagnosis present

## 2018-03-26 DIAGNOSIS — O165 Unspecified maternal hypertension, complicating the puerperium: Secondary | ICD-10-CM | POA: Diagnosis present

## 2018-03-26 DIAGNOSIS — Z3483 Encounter for supervision of other normal pregnancy, third trimester: Secondary | ICD-10-CM

## 2018-03-26 DIAGNOSIS — O4202 Full-term premature rupture of membranes, onset of labor within 24 hours of rupture: Secondary | ICD-10-CM

## 2018-03-26 DIAGNOSIS — O4290 Premature rupture of membranes, unspecified as to length of time between rupture and onset of labor, unspecified weeks of gestation: Secondary | ICD-10-CM | POA: Diagnosis present

## 2018-03-26 DIAGNOSIS — Z302 Encounter for sterilization: Secondary | ICD-10-CM

## 2018-03-26 DIAGNOSIS — Z609 Problem related to social environment, unspecified: Secondary | ICD-10-CM

## 2018-03-26 DIAGNOSIS — O99344 Other mental disorders complicating childbirth: Secondary | ICD-10-CM | POA: Diagnosis present

## 2018-03-26 DIAGNOSIS — Z87898 Personal history of other specified conditions: Secondary | ICD-10-CM

## 2018-03-26 LAB — COMPREHENSIVE METABOLIC PANEL
ALT: 10 U/L (ref 0–44)
AST: 16 U/L (ref 15–41)
Albumin: 3 g/dL — ABNORMAL LOW (ref 3.5–5.0)
Alkaline Phosphatase: 78 U/L (ref 38–126)
Anion gap: 10 (ref 5–15)
BUN: <5 mg/dL — ABNORMAL LOW (ref 6–20)
CO2: 20 mmol/L — ABNORMAL LOW (ref 22–32)
Calcium: 9 mg/dL (ref 8.9–10.3)
Chloride: 106 mmol/L (ref 98–111)
Creatinine, Ser: 0.58 mg/dL (ref 0.44–1.00)
GFR calc Af Amer: >60 mL/min (ref >60)
GFR calc non Af Amer: >60 mL/min (ref >60)
Glucose, Bld: 98 mg/dL (ref 70–99)
POTASSIUM: 3.3 mmol/L — AB (ref 3.5–5.1)
Sodium: 136 mmol/L (ref 135–145)
Total Bilirubin: 0.7 mg/dL (ref 0.3–1.2)
Total Protein: 6.2 g/dL — ABNORMAL LOW (ref 6.5–8.1)

## 2018-03-26 LAB — RAPID URINE DRUG SCREEN, HOSP PERFORMED
Amphetamines: NOT DETECTED
Barbiturates: NOT DETECTED
Benzodiazepines: NOT DETECTED
Cocaine: NOT DETECTED
Opiates: NOT DETECTED
TETRAHYDROCANNABINOL: NOT DETECTED

## 2018-03-26 LAB — CBC
HCT: 26.5 % — ABNORMAL LOW (ref 36.0–46.0)
Hemoglobin: 9.2 g/dL — ABNORMAL LOW (ref 12.0–15.0)
MCH: 30.3 pg (ref 26.0–34.0)
MCHC: 34.7 g/dL (ref 30.0–36.0)
MCV: 87.2 fL (ref 80.0–100.0)
Platelets: 335 10*3/uL (ref 150–400)
RBC: 3.04 MIL/uL — ABNORMAL LOW (ref 3.87–5.11)
RDW: 13.7 % (ref 11.5–15.5)
WBC: 8.9 10*3/uL (ref 4.0–10.5)
nRBC: 0 % (ref 0.0–0.2)

## 2018-03-26 LAB — PROTEIN / CREATININE RATIO, URINE
Creatinine, Urine: 66 mg/dL
Protein Creatinine Ratio: 0.24 mg/mg{Cre} — ABNORMAL HIGH (ref 0.00–0.15)
Total Protein, Urine: 16 mg/dL

## 2018-03-26 LAB — TYPE AND SCREEN
ABO/RH(D): O POS
Antibody Screen: NEGATIVE

## 2018-03-26 LAB — RPR: RPR Ser Ql: NONREACTIVE

## 2018-03-26 LAB — HEPATITIS B SURFACE ANTIGEN: Hepatitis B Surface Ag: NEGATIVE

## 2018-03-26 MED ORDER — IBUPROFEN 600 MG PO TABS
600.0000 mg | ORAL_TABLET | Freq: Four times a day (QID) | ORAL | Status: DC
  Administered 2018-03-26 – 2018-03-28 (×9): 600 mg via ORAL
  Filled 2018-03-26 (×9): qty 1

## 2018-03-26 MED ORDER — DIBUCAINE 1 % RE OINT: 1.0000 "application " | TOPICAL_OINTMENT | RECTAL | Status: DC | PRN

## 2018-03-26 MED ORDER — TETANUS-DIPHTH-ACELL PERTUSSIS 5-2.5-18.5 LF-MCG/0.5 IM SUSP: 0.5000 mL | Freq: Once | INTRAMUSCULAR | Status: DC

## 2018-03-26 MED ORDER — COCONUT OIL OIL: 1.0000 "application " | TOPICAL_OIL | Status: DC | PRN

## 2018-03-26 MED ORDER — SIMETHICONE 80 MG PO CHEW: 80.0000 mg | CHEWABLE_TABLET | ORAL | Status: DC | PRN

## 2018-03-26 MED ORDER — MEASLES, MUMPS & RUBELLA VAC IJ SOLR
0.5000 mL | Freq: Once | INTRAMUSCULAR | Status: DC
  Filled 2018-03-26: qty 0.5

## 2018-03-26 MED ORDER — FENTANYL 2.5 MCG/ML BUPIVACAINE 1/10 % EPIDURAL INFUSION (WH - ANES)
14.0000 mL/h | INTRAMUSCULAR | Status: DC | PRN
  Administered 2018-03-26: 14 mL/h via EPIDURAL
  Filled 2018-03-26: qty 100

## 2018-03-26 MED ORDER — LACTATED RINGERS IV SOLN
INTRAVENOUS | Status: DC
  Administered 2018-03-26: via INTRAVENOUS

## 2018-03-26 MED ORDER — ACETAMINOPHEN 325 MG PO TABS
650.0000 mg | ORAL_TABLET | ORAL | Status: DC | PRN
  Administered 2018-03-26 – 2018-03-28 (×6): 650 mg via ORAL
  Filled 2018-03-26 (×6): qty 2

## 2018-03-26 MED ORDER — LACTATED RINGERS IV SOLN: 500.0000 mL | Freq: Once | INTRAVENOUS | Status: DC

## 2018-03-26 MED ORDER — EPHEDRINE 5 MG/ML INJ
10.0000 mg | INTRAVENOUS | Status: DC | PRN
  Filled 2018-03-26: qty 2

## 2018-03-26 MED ORDER — ONDANSETRON HCL 4 MG/2ML IJ SOLN: 4.0000 mg | INTRAMUSCULAR | Status: DC | PRN

## 2018-03-26 MED ORDER — ACETAMINOPHEN 325 MG PO TABS: 650.0000 mg | ORAL_TABLET | ORAL | Status: DC | PRN

## 2018-03-26 MED ORDER — LACTATED RINGERS IV SOLN
INTRAVENOUS | Status: DC
  Administered 2018-03-26: 03:00:00 via INTRAVENOUS

## 2018-03-26 MED ORDER — ONDANSETRON HCL 4 MG PO TABS: 4.0000 mg | ORAL_TABLET | ORAL | Status: DC | PRN

## 2018-03-26 MED ORDER — BENZOCAINE-MENTHOL 20-0.5 % EX AERO
1.0000 "application " | INHALATION_SPRAY | CUTANEOUS | Status: DC | PRN
  Administered 2018-03-26: 1 via TOPICAL
  Filled 2018-03-26: qty 56

## 2018-03-26 MED ORDER — ONDANSETRON HCL 4 MG/2ML IJ SOLN: 4.0000 mg | Freq: Four times a day (QID) | INTRAMUSCULAR | Status: DC | PRN

## 2018-03-26 MED ORDER — OXYCODONE HCL 5 MG PO TABS
5.0000 mg | ORAL_TABLET | Freq: Four times a day (QID) | ORAL | Status: AC | PRN
  Administered 2018-03-26 – 2018-03-27 (×2): 5 mg via ORAL
  Filled 2018-03-26 (×3): qty 1

## 2018-03-26 MED ORDER — SENNOSIDES-DOCUSATE SODIUM 8.6-50 MG PO TABS
2.0000 | ORAL_TABLET | ORAL | Status: DC
  Administered 2018-03-26 – 2018-03-27 (×2): 2 via ORAL
  Filled 2018-03-26 (×2): qty 2

## 2018-03-26 MED ORDER — LIDOCAINE-EPINEPHRINE (PF) 2 %-1:200000 IJ SOLN
INTRAMUSCULAR | Status: DC | PRN
  Administered 2018-03-26: 3 mL via EPIDURAL
  Administered 2018-03-26: 4 mL via EPIDURAL

## 2018-03-26 MED ORDER — SERTRALINE HCL 50 MG PO TABS
50.0000 mg | ORAL_TABLET | Freq: Every day | ORAL | Status: DC
  Administered 2018-03-26 – 2018-03-27 (×2): 50 mg via ORAL
  Filled 2018-03-26 (×2): qty 1

## 2018-03-26 MED ORDER — PRENATAL MULTIVITAMIN CH
1.0000 | ORAL_TABLET | Freq: Every day | ORAL | Status: DC
  Administered 2018-03-26 – 2018-03-28 (×2): 1 via ORAL
  Filled 2018-03-26 (×3): qty 1

## 2018-03-26 MED ORDER — LACTATED RINGERS IV SOLN: 500.0000 mL | INTRAVENOUS | Status: DC | PRN

## 2018-03-26 MED ORDER — OXYTOCIN BOLUS FROM INFUSION
500.0000 mL | Freq: Once | INTRAVENOUS | Status: AC
  Administered 2018-03-26: 500 mL via INTRAVENOUS

## 2018-03-26 MED ORDER — SOD CITRATE-CITRIC ACID 500-334 MG/5ML PO SOLN: 30.0000 mL | ORAL | Status: DC | PRN

## 2018-03-26 MED ORDER — PHENYLEPHRINE 40 MCG/ML (10ML) SYRINGE FOR IV PUSH (FOR BLOOD PRESSURE SUPPORT)
80.0000 ug | PREFILLED_SYRINGE | INTRAVENOUS | Status: DC | PRN
  Filled 2018-03-26: qty 10

## 2018-03-26 MED ORDER — DIPHENHYDRAMINE HCL 25 MG PO CAPS
25.0000 mg | ORAL_CAPSULE | Freq: Four times a day (QID) | ORAL | Status: DC | PRN
  Administered 2018-03-26: 25 mg via ORAL
  Filled 2018-03-26: qty 1

## 2018-03-26 MED ORDER — OXYTOCIN 40 UNITS IN NORMAL SALINE INFUSION - SIMPLE MED
2.5000 [IU]/h | INTRAVENOUS | Status: DC
  Administered 2018-03-26: 2.5 [IU]/h via INTRAVENOUS
  Filled 2018-03-26: qty 1000

## 2018-03-26 MED ORDER — FENTANYL CITRATE (PF) 100 MCG/2ML IJ SOLN: 100.0000 ug | INTRAMUSCULAR | Status: DC | PRN

## 2018-03-26 MED ORDER — PHENYLEPHRINE 40 MCG/ML (10ML) SYRINGE FOR IV PUSH (FOR BLOOD PRESSURE SUPPORT)
80.0000 ug | PREFILLED_SYRINGE | INTRAVENOUS | Status: DC | PRN
  Filled 2018-03-26 (×2): qty 10

## 2018-03-26 MED ORDER — DIPHENHYDRAMINE HCL 50 MG/ML IJ SOLN: 12.5000 mg | INTRAMUSCULAR | Status: DC | PRN

## 2018-03-26 MED ORDER — LIDOCAINE HCL (PF) 1 % IJ SOLN
30.0000 mL | INTRAMUSCULAR | Status: DC | PRN
  Filled 2018-03-26: qty 30

## 2018-03-26 MED ORDER — WITCH HAZEL-GLYCERIN EX PADS: 1.0000 "application " | MEDICATED_PAD | CUTANEOUS | Status: DC | PRN

## 2018-03-26 NOTE — Anesthesia Procedure Notes (Signed)
Epidural Patient location during procedure: OB Start time: 03/26/2018 2:55 AM End time: 03/26/2018 3:10 AM  Staffing Anesthesiologist: Freddrick March, MD Performed: anesthesiologist   Preanesthetic Checklist Completed: patient identified, pre-op evaluation, timeout performed, IV checked, risks and benefits discussed and monitors and equipment checked  Epidural Patient position: sitting Prep: site prepped and draped and DuraPrep Patient monitoring: continuous pulse ox, blood pressure, heart rate and cardiac monitor Approach: midline Location: L3-L4 Injection technique: LOR air  Needle:  Needle type: Tuohy  Needle gauge: 17 G Needle length: 9 cm Needle insertion depth: 5 cm Catheter type: closed end flexible Catheter size: 19 Gauge Catheter at skin depth: 10 cm Test dose: negative  Assessment Sensory level: T8 Events: blood not aspirated, injection not painful, no injection resistance, negative IV test and no paresthesia  Additional Notes Patient identified. Risks/Benefits/Options discussed with patient including but not limited to bleeding, infection, nerve damage, paralysis, failed block, incomplete pain control, headache, blood pressure changes, nausea, vomiting, reactions to medication both or allergic, itching and postpartum back pain. Confirmed with bedside nurse the patient's most recent platelet count. Confirmed with patient that they are not currently taking any anticoagulation, have any bleeding history or any family history of bleeding disorders. Patient expressed understanding and wished to proceed. All questions were answered. Sterile technique was used throughout the entire procedure. Please see nursing notes for vital signs. Test dose was given through epidural catheter and negative prior to continuing to dose epidural or start infusion. Warning signs of high block given to the patient including shortness of breath, tingling/numbness in hands, complete motor block,  or any concerning symptoms with instructions to call for help. Patient was given instructions on fall risk and not to get out of bed. All questions and concerns addressed with instructions to call with any issues or inadequate analgesia.  Reason for block:procedure for pain

## 2018-03-26 NOTE — ED Triage Notes (Signed)
Pt c/o contractions and my water broke 10 minutes ago, pt assisted to bed, has "green" fluid leakning from vaginal area, Dr Tomi Bamberger at bedside, vaginal exam performed by Dr Tomi Bamberger with report of cervical dilation to "two finger tips"

## 2018-03-26 NOTE — H&P (Signed)
LABOR AND DELIVERY ADMISSION HISTORY AND PHYSICAL NOTE  Kristin Simmons is a 33 y.o. female [phone] with IUP at [redacted]w[redacted]d by 9 week [place] presenting for PROM.  Reports that water broke around 1am then contractions started shortly after.  She reports positive fetal movement. She denies leakage of fluid or vaginal bleeding.  Prenatal History/Complications: PNC at [facility] Pregnancy complications:  - smoker - depression - alcohol use in pregnancy - hx of preterm delivery - hx of cocaine use  Past Medical History: Past Medical History:  Diagnosis Date  . Anemia   . Anxiety   . Cancer (HCC)    cervical  . Depression   . Hypertension   . Vaginal Pap smear, abnormal     Past Surgical History: Past Surgical History:  Procedure Laterality Date  . NO PAST SURGERIES      Obstetrical History: OB History    Gravida  6   Para  4   Term  1   Preterm  3   AB  1   Living  4     SAB  1   TAB      Ectopic      Multiple      Live Births  4           Social History: Social History   Socioeconomic History  . Marital status: Single    Spouse name: Not on file  . Number of children: 4  . Years of education: Not on file  . Highest education level: Not on file  Occupational History  . Not on file  Social Needs  . Financial resource strain: Not on file  . Food insecurity:    Worry: Not on file    Inability: Not on file  . Transportation needs:    Medical: Not on file    Non-medical: Not on file  Tobacco Use  . Smoking status: Current Every Day Smoker    Packs/day: 0.25    Years: 15.00    Pack years: 3.75    Types: Cigarettes  . Smokeless tobacco: Never Used  Substance and Sexual Activity  . Alcohol use: Not Currently    Alcohol/week: 1.0 - 2.0 standard drinks    Types: 1 - 2 Cans of beer per week  . Drug use: No    Types: Marijuana    Comment: last use 2007  . Sexual activity: Yes    Birth control/protection: None  Lifestyle  . Physical activity:    Days  per week: Not on file    Minutes per session: Not on file  . Stress: Not on file  Relationships  . Social connections:    Talks on phone: Not on file    Gets together: Not on file    Attends religious service: Not on file    Active member of club or organization: Not on file    Attends meetings of clubs or organizations: Not on file    Relationship status: Not on file  Other Topics Concern  . Not on file  Social History Narrative  . Not on file    Family History: Family History  Problem Relation Age of Onset  . Alzheimer's disease Paternal Grandmother   . Cancer Maternal Grandmother   . Cancer Maternal Grandfather   . Hypertension Father   . Anemia Mother   . Hypertension Mother   . Thyroid disease Mother   . Diabetes Sister   . Hypertension Sister   . Mental illness Brother   .  Asthma Daughter   . Bronchitis Daughter   . Asthma Daughter   . Bronchitis Daughter   . Asthma Daughter   . Bronchitis Daughter     Allergies: Allergies  Allergen Reactions  . Latex Itching and Rash    Facility-Administered Medications Prior to Admission  Medication Dose Route Frequency Provider Last Rate Last Dose  . HYDROXYprogesterone Caproate SOAJ 275 mg  275 mg Subcutaneous Weekly Jonnie Kind, MD   275 mg at 03/05/18 1438   Medications Prior to Admission  Medication Sig Dispense Refill Last Dose  . prenatal vitamin w/FE, FA (PRENATAL 1 + 1) 27-1 MG TABS tablet Take 1 tablet by mouth daily at 12 noon. 30 each 11 Taking  . QUEtiapine (SEROQUEL) 100 MG tablet Take 100 mg by mouth at bedtime.   Not Taking  . sertraline (ZOLOFT) 100 MG tablet Take 100 mg by mouth daily.   Not Taking     Review of Systems  All systems reviewed and negative except as stated in HPI  Physical Exam Blood pressure (!) 146/93, pulse 86, temperature 98.1 F (36.7 C), temperature source Oral, resp. rate 19, height 5\' 4"  (1.626 m), weight 64.6 kg, SpO2 100 %, not currently breastfeeding. General  appearance: alert, oriented, distress due to pain Lungs: normal respiratory effort Heart: regular rate Abdomen: soft, non-tender; gravid, FH appropriate for GA Extremities: No calf swelling or tenderness Presentation: cephalic Fetal monitoring: baseline 120s/minimal variability/10x10/contractions difficult to trace, but no obvious decels  Uterine activity: difficult to trace, pt reports regular painful contractions     Prenatal labs: ABO, Rh:  O positive Antibody: Negative (12/16 0916) Rubella:  Immune RPR: Non Reactive (12/16 0916)  HBsAg:   Ordered on admission HIV: Non Reactive (12/16 0916)  GC/Chlamydia: negative GBS: Negative (02/03 1600)  2-hr GTT: 82/130/111 Genetic screening:  declined Anatomy US: normal  Prenatal Transfer Tool  Maternal Diabetes: No Genetic Screening: Declined Maternal Ultrasounds/Referrals: Normal Fetal Ultrasounds or other Referrals:  None Maternal Substance Abuse:  No Significant Maternal Medications:  None Significant Maternal Lab Results: None  No results found for this or any previous visit (from the past 24 hour(s)).  Patient Active Problem List   Diagnosis Date Noted  . Trichomoniasis 12/05/2017  . High risk social situation  12/05/2017  . Supervision of normal pregnancy 10/19/2017  . History of cocaine use 10/19/2017  . Abnormal Pap smear of cervix 01/18/2016  . Smoker 01/13/2016  . Depression 01/13/2016  . Alcohol use affecting pregnancy, first trimester 01/13/2016  . History of preterm delivery, currently pregnant 01/13/2016  . Underweight 01/13/2016    Assessment: Kristin Simmons is a 33 y.o. [id number] at [redacted]w[redacted]d here for PROM at term  #Labor: progressing. Will augment with pitocin if necessary  #Pain: Desires epidural #FWB: Cat 2 (reassuring) #ID:  GBS neg #MOF: Bottle #MOC: Nexplanon #Circ:  NA  Aura Camps, MD  03/26/2018, 2:05 AM

## 2018-03-26 NOTE — ED Provider Notes (Signed)
[facility] EMERGENCY DEPARTMENT Provider Note   CSN: [id number] Arrival date & time: 03/26/18  0055  Time seen 12:55 AM   History   Chief Complaint Chief Complaint  Patient presents with  . Labor Eval    HPI Kristin Simmons is a 33 y.o. female.  HPI patient states she is [redacted] weeks pregnant followed by [facility].  She is G6, P4 Ab1, she states her prior pregnancies were all premature.  She states 10 minutes prior to arrival she had acute onset of abdominal pain that "comes and goes" and states her water broke.  PCP Patient, No Pcp Per   Past Medical History:  Diagnosis Date  . Anemia   . Anxiety   . Cancer (HCC)    cervical  . Depression   . Hypertension   . Vaginal Pap smear, abnormal     Patient Active Problem List   Diagnosis Date Noted  . Trichomoniasis 12/05/2017  . High risk social situation  12/05/2017  . Supervision of normal pregnancy 10/19/2017  . History of cocaine use 10/19/2017  . Abnormal Pap smear of cervix 01/18/2016  . Smoker 01/13/2016  . Depression 01/13/2016  . Alcohol use affecting pregnancy, first trimester 01/13/2016  . History of preterm delivery, currently pregnant 01/13/2016  . Underweight 01/13/2016    Past Surgical History:  Procedure Laterality Date  . NO PAST SURGERIES       OB History    Gravida  6   Para  4   Term  1   Preterm  3   AB  1   Living  4     SAB  1   TAB      Ectopic      Multiple      Live Births  4            Home Medications    Prior to Admission medications   Medication Sig Start Date End Date Taking? Authorizing Provider  prenatal vitamin w/FE, FA (PRENATAL 1 + 1) 27-1 MG TABS tablet Take 1 tablet by mouth daily at 12 noon. 12/08/15   Estill Dooms, NP  QUEtiapine (SEROQUEL) 100 MG tablet Take 100 mg by mouth at bedtime.    [provider]  sertraline (ZOLOFT) 100 MG tablet Take 100 mg by mouth daily.    [provider]    Family History Family  History  Problem Relation Age of Onset  . Alzheimer's disease Paternal Grandmother   . Cancer Maternal Grandmother   . Cancer Maternal Grandfather   . Hypertension Father   . Anemia Mother   . Hypertension Mother   . Thyroid disease Mother   . Diabetes Sister   . Hypertension Sister   . Mental illness Brother   . Asthma Daughter   . Bronchitis Daughter   . Asthma Daughter   . Bronchitis Daughter   . Asthma Daughter   . Bronchitis Daughter     Social History Social History   Tobacco Use  . Smoking status: Current Every Day Smoker    Packs/day: 0.25    Years: 15.00    Pack years: 3.75    Types: Cigarettes  . Smokeless tobacco: Never Used  Substance Use Topics  . Alcohol use: Not Currently    Alcohol/week: 1.0 - 2.0 standard drinks    Types: 1 - 2 Cans of beer per week  . Drug use: No    Types: Marijuana    Comment: last use 2007  Allergies   Latex   Review of Systems Review of Systems  All other systems reviewed and are negative.    Physical Exam Updated Vital Signs BP (!) 149/82   Pulse 91   Temp 98.7 F (37.1 C) (Oral)   Resp (!) 24   Ht 5\' 4"  (1.626 m)   Wt 64.6 kg   LMP  (LMP Unknown)   SpO2 100%   BMI 24.45 kg/m   Physical Exam Vitals signs and nursing note reviewed. Exam conducted with a chaperone present.  Constitutional:      General: She is in acute distress.     Appearance: Normal appearance.  HENT:     Head: Normocephalic and atraumatic.     Right Ear: External ear normal.     Left Ear: External ear normal.     Nose: Nose normal.  Eyes:     Extraocular Movements: Extraocular movements intact.     Conjunctiva/sclera: Conjunctivae normal.  Neck:     Musculoskeletal: Normal range of motion.  Cardiovascular:     Rate and Rhythm: Normal rate.  Pulmonary:     Effort: Pulmonary effort is normal. No respiratory distress.  Abdominal:     Comments: Abdomen is consistent with dates, the baby seems to be laying on the left side.    Genitourinary:    General: Normal vulva.     Comments: Patient is noted to have some clear fluid coming from her vagina.  There is no blood.  When I examined her she is dilated approximately 2 finger widths.  Patient was examined with sterile gloves. Musculoskeletal: Normal range of motion.  Skin:    General: Skin is warm and dry.  Neurological:     General: No focal deficit present.     Mental Status: She is alert and oriented to person, place, and time.     Cranial Nerves: No cranial nerve deficit.  Psychiatric:        Mood and Affect: Mood normal.        Behavior: Behavior normal.        Thought Content: Thought content normal.      ED Treatments / Results  Labs (all labs ordered are listed, but only abnormal results are displayed) Labs Reviewed - No data to display  EKG None  Radiology No results found.  Procedures Procedures (including critical care time)  Medications Ordered in ED Medications - No data to display   Initial Impression / Assessment and Plan / ED Course  I have reviewed the triage vital signs and the nursing notes.  Pertinent labs & imaging results that were available during my care of the patient were reviewed by me and considered in my medical decision making (see chart for details).     1 AM patient was discussed with Dr. Elonda Husky, [facility] on-call.  I was going to test her fluid but he states it is not necessary.  He states to send patient to MAU.  1:05 AM patient discussed with Dr. Arlina Robes , at [facility] and he states to have her sent to the MAU.  Fetal heart rate was in the 130s.  EMS was contacted.  IV was started.  Final Clinical Impressions(s) / ED Diagnoses   Final diagnoses:  Amniotic fluid leaking  Generalized abdominal pain    ED Discharge Orders    None     Transfer to [facility] MAU  Rolland Porter, MD, Barbette Or, MD 03/26/18 [phone]
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
## 2018-03-26 NOTE — ED Notes (Addendum)
Pt gave verbal permission to be transferred, form signed by spouse, witnessed by Luis Abed and Chiropodist

## 2018-03-26 NOTE — Clinical Social Work Maternal (Addendum)
CLINICAL SOCIAL WORK MATERNAL/CHILD NOTE  Patient Details  Name: Kristin Simmons MRN: [id number] Date of Birth: Dec 26, 1985  Date:  03/26/2018  Clinical Social Worker Initiating Note:  Kingsley Spittle LCSW  Date/Time: Initiated:  03/26/18/1410     Child's Name:  Kristin Simmons   Biological Parents:  Mother, Father(FOB: Kristin Simmons)   Need for Interpreter:  None   Reason for Referral:  Current CPS Involvement   Address:   [address], [place]    Phone number:  [phone] (home)     Additional phone number: N/A  Household Members/Support Persons (HM/SP):       HM/SP Name Relationship DOB or Age  HM/SP -1        HM/SP -2        HM/SP -3        HM/SP -4        HM/SP -5        HM/SP -6        HM/SP -7        HM/SP -8          Natural Supports (not living in the home):  Children, Spouse/significant other(Kristin Simmons (12), Kristin Simmons (10), Kristin Simmons (4), Kristin Simmons (2))   Professional Supports:     Employment: Full-time(not working at the moment )   Type of Work: Clinical biochemist    Education:      Homebound arranged:    Museum/gallery curator Resources:  Medicaid   Other Resources:  [employer], Physicist, medical    Cultural/Religious Considerations Which May Impact Care:  N/A  Strengths:  Ability to meet basic needs , Home prepared for child (No pediatrcian chosen)   Psychotropic Medications:         Pediatrician:       Pediatrician List:   [place]      Pediatrician Fax Number:    Risk Factors/Current Problems:  Other (Comment)(CPS involvement )   Cognitive State:  Alert    Mood/Affect:  Flat , Irritable , Calm    CSW Assessment: CSW met with MOB via bedside after receiving consult due to other four children not being in MOB's custody. MOB seemed guarded and not very fourth coming during conversation however answered all of CSW's questions. This is MOB's 5th  child, all girls- Kristin Simmons(42), Kristin Simmons (10), Kristin Simmons (4), Kristin Simmons (2), and Kristin Simmons (newborn). MOB was open regarding not having custody of other 4 children- 2 oldest children are living with her sister and 2 youngest children are living with "Ms. Kristin Simmons" who is their foster parent. MOB stated children were placed into foster care due to her drug use. FOB, Kristin Simmons, is currently involved in care however MOB/ FOB are not living together at this time. MOB informed CSW that she has a court case this Thursday 2/13 to regain custody of her children.   CSW questioned how MOB was feeling emotionally- MOB stated "fine". CSW questioned if MOB has ever had any anxiety/ depression- MOB stated she was previously taking Zoloft and Seroquel for depression but stopped taking it once she found out she was pregnant. MOB voiced wanting to talk to her MD about possibly starting the prescriptions again. MOB stated she felt comfortable asking the MD questions about prescriptions/ wanting to start them. CSW also encouraged MOB to evaluate her emotions during postpartum and reach out to her supports in the event she feels  anxious/ depressed. CSW briefly spoke with MOB regarding SIDS, safe sleeping and second hand smoke, MOB stated she is a current smoker and is aware of removing her shirt. MOB also stated "all my kids have asthma".   CSW informed MOB of CPS notification and umbilical cord drug screen. MOB voiced understanding but seemed frustrated but stating "ive worked hard to get my kids back and I dont need anymore trouble". CSW will continue to follow up. CPS report has been completed with [facility]. At this time there are barriers to discharge- CSW will follow up with CPS.   CSW Plan/Description:  Sudden Infant Death Syndrome (SIDS) Education, Perinatal Mood and Anxiety Disorder (PMADs) Education, Hospital Drug Screen Policy Information, Child Protective Service Report , CSW Awaiting CPS Disposition Plan, CSW Will  Continue to Monitor Umbilical Cord Tissue Drug Screen Results and Make Report if Kristin Spence, LCSW 03/26/2018, 2:50 PM

## 2018-03-26 NOTE — Anesthesia Preprocedure Evaluation (Signed)
Anesthesia Evaluation  Patient identified by MRN, date of birth, ID band Patient awake    Reviewed: Allergy & Precautions, NPO status , [name] Chart, lab work & pertinent test results  Airway Mallampati: I  TM Distance: >3 FB Neck ROM: Full    Dental no notable dental hx.    Pulmonary neg pulmonary ROS, Current Smoker,    Pulmonary exam normal breath sounds clear to auscultation       Cardiovascular hypertension, negative cardio ROS Normal cardiovascular exam Rhythm:Regular Rate:Normal     Neuro/Psych PSYCHIATRIC DISORDERS Anxiety Depression negative neurological ROS     GI/Hepatic negative GI ROS, (+)     substance abuse  alcohol use and cocaine use,   Endo/Other  negative endocrine ROS  Renal/GU negative Renal ROS  negative genitourinary   Musculoskeletal negative musculoskeletal ROS (+)   Abdominal   Peds  Hematology  (+) Blood dyscrasia, anemia ,   Anesthesia Other Findings   Reproductive/Obstetrics (+) Pregnancy                             Anesthesia Physical Anesthesia Plan  ASA: II  Anesthesia Plan: Epidural   Post-op Pain Management:    Induction:   PONV Risk Score and Plan: Treatment may vary due to age or medical condition  Airway Management Planned: Natural Airway  Additional Equipment:   Intra-op Plan:   Post-operative Plan:   Informed Consent: I have reviewed the patients History and Physical, chart, labs and discussed the procedure including the risks, benefits and alternatives for the proposed anesthesia with the patient or authorized representative who has indicated his/her understanding and acceptance.       Plan Discussed with: Anesthesiologist  Anesthesia Plan Comments: (Patient identified. Risks, benefits, options discussed with patient including but not limited to bleeding, infection, nerve damage, paralysis, failed block, incomplete pain  control, headache, blood pressure changes, nausea, vomiting, reactions to medication, itching, and post partum back pain. Confirmed with bedside nurse the patient's most recent platelet count. Confirmed with the patient that they are not taking any anticoagulation, have any bleeding history or any family history of bleeding disorders. Patient expressed understanding and wishes to proceed. All questions were answered. )        Anesthesia Quick Evaluation

## 2018-03-26 NOTE — Anesthesia Postprocedure Evaluation (Signed)
Anesthesia Post Note  Patient: Kristin Simmons  Procedure(s) Performed: AN AD HOC LABOR EPIDURAL     Patient location during evaluation: Mother Baby Anesthesia Type: Epidural Level of consciousness: awake and alert and oriented Pain management: satisfactory to patient Vital Signs Assessment: post-procedure vital signs reviewed and stable Respiratory status: respiratory function stable Cardiovascular status: stable Postop Assessment: no headache, no backache, epidural receding, patient able to bend at knees, no signs of nausea or vomiting and adequate PO intake Anesthetic complications: no    Last Vitals:  Vitals:   03/26/18 0800 03/26/18 0925  BP: 123/80 115/85  Pulse: 67 67  Resp: 18 18  Temp: 36.6 C 36.6 C  SpO2:      Last Pain:  Vitals:   03/26/18 0925  TempSrc: Oral  PainSc: 0-No pain   Pain Goal:                   ,

## 2018-03-27 ENCOUNTER — Inpatient Hospital Stay (HOSPITAL_COMMUNITY): Payer: Medicaid Other | Admitting: Certified Registered Nurse Anesthetist

## 2018-03-27 ENCOUNTER — Encounter (HOSPITAL_COMMUNITY)
Admission: EM | Disposition: A | Discharge status: Home / Self Care | Payer: Self-pay | Source: Home / Self Care | Attending: Obstetrics and Gynecology

## 2018-03-27 ENCOUNTER — Encounter (HOSPITAL_COMMUNITY): Payer: Self-pay

## 2018-03-27 DIAGNOSIS — Z302 Encounter for sterilization: Secondary | ICD-10-CM

## 2018-03-27 HISTORY — PX: TUBAL LIGATION: SHX77

## 2018-03-27 SURGERY — LIGATION, FALLOPIAN TUBE, POSTPARTUM
Anesthesia: Epidural | Site: Abdomen | Laterality: Bilateral | Wound class: Clean Contaminated

## 2018-03-27 MED ORDER — OXYCODONE HCL 5 MG PO TABS
5.0000 mg | ORAL_TABLET | ORAL | Status: DC | PRN
  Administered 2018-03-27 – 2018-03-28 (×4): 5 mg via ORAL
  Filled 2018-03-27 (×4): qty 1

## 2018-03-27 MED ORDER — PROMETHAZINE HCL 25 MG/ML IJ SOLN: 6.2500 mg | INTRAMUSCULAR | Status: DC | PRN

## 2018-03-27 MED ORDER — FENTANYL CITRATE (PF) 100 MCG/2ML IJ SOLN: 25.0000 ug | INTRAMUSCULAR | Status: DC | PRN

## 2018-03-27 MED ORDER — FAMOTIDINE 20 MG PO TABS
40.0000 mg | ORAL_TABLET | Freq: Once | ORAL | Status: AC
  Administered 2018-03-27: 40 mg via ORAL
  Filled 2018-03-27: qty 2

## 2018-03-27 MED ORDER — ACETAMINOPHEN 10 MG/ML IV SOLN: 1000.0000 mg | Freq: Once | INTRAVENOUS | Status: DC | PRN

## 2018-03-27 MED ORDER — SODIUM CHLORIDE 0.9 % IR SOLN
Status: DC | PRN
  Administered 2018-03-27: 1

## 2018-03-27 MED ORDER — BUPIVACAINE HCL (PF) 0.25 % IJ SOLN
INTRAMUSCULAR | Status: DC | PRN
  Administered 2018-03-27: 30 mL

## 2018-03-27 MED ORDER — FENTANYL CITRATE (PF) 100 MCG/2ML IJ SOLN
INTRAMUSCULAR | Status: DC | PRN
  Administered 2018-03-27: 50 ug via INTRAVENOUS

## 2018-03-27 MED ORDER — LACTATED RINGERS IV SOLN
INTRAVENOUS | Status: DC | PRN
  Administered 2018-03-27: 09:00:00 via INTRAVENOUS

## 2018-03-27 MED ORDER — SODIUM BICARBONATE 8.4 % IV SOLN
INTRAVENOUS | Status: DC | PRN
  Administered 2018-03-27 (×3): 5 mL via EPIDURAL

## 2018-03-27 MED ORDER — LACTATED RINGERS IV SOLN: INTRAVENOUS | Status: DC

## 2018-03-27 MED ORDER — BUPIVACAINE HCL (PF) 0.25 % IJ SOLN
INTRAMUSCULAR | Status: AC
  Filled 2018-03-27: qty 30

## 2018-03-27 MED ORDER — ONDANSETRON HCL 4 MG/2ML IJ SOLN
INTRAMUSCULAR | Status: AC
  Filled 2018-03-27: qty 2

## 2018-03-27 MED ORDER — MEPERIDINE HCL 25 MG/ML IJ SOLN: 6.2500 mg | INTRAMUSCULAR | Status: DC | PRN

## 2018-03-27 MED ORDER — ONDANSETRON HCL 4 MG/2ML IJ SOLN
INTRAMUSCULAR | Status: DC | PRN
  Administered 2018-03-27: 4 mg via INTRAVENOUS

## 2018-03-27 MED ORDER — MIDAZOLAM HCL 5 MG/5ML IJ SOLN
INTRAMUSCULAR | Status: DC | PRN
  Administered 2018-03-27 (×2): 1 mg via INTRAVENOUS

## 2018-03-27 MED ORDER — ACETAMINOPHEN 160 MG/5ML PO SOLN
325.0000 mg | Freq: Once | ORAL | Status: AC
  Administered 2018-03-27: 650 mg via ORAL
  Filled 2018-03-27: qty 20.3

## 2018-03-27 MED ORDER — FENTANYL CITRATE (PF) 100 MCG/2ML IJ SOLN
INTRAMUSCULAR | Status: AC
  Filled 2018-03-27: qty 2

## 2018-03-27 MED ORDER — MIDAZOLAM HCL 2 MG/2ML IJ SOLN
INTRAMUSCULAR | Status: AC
  Filled 2018-03-27: qty 2

## 2018-03-27 MED ORDER — METOCLOPRAMIDE HCL 10 MG PO TABS
10.0000 mg | ORAL_TABLET | Freq: Once | ORAL | Status: AC
  Administered 2018-03-27: 10 mg via ORAL
  Filled 2018-03-27: qty 1

## 2018-03-27 MED ORDER — ACETAMINOPHEN 325 MG PO TABS: 325.0000 mg | ORAL_TABLET | Freq: Once | ORAL | Status: AC

## 2018-03-27 SURGICAL SUPPLY — 22 items
BLADE SURG 11 STRL SS (BLADE) ×3 IMPLANT
CLIP FILSHIE TUBAL LIGA STRL (Clip) ×3 IMPLANT
DRESSING OPSITE X SMALL 2X3 (GAUZE/BANDAGES/DRESSINGS) ×2 IMPLANT
DRSG OPSITE POSTOP 3X4 (GAUZE/BANDAGES/DRESSINGS) ×3 IMPLANT
DURAPREP 26ML APPLICATOR (WOUND CARE) ×3 IMPLANT
GLOVE BIOGEL PI IND STRL 7.0 (GLOVE) ×1 IMPLANT
GLOVE BIOGEL PI IND STRL 7.5 (GLOVE) ×1 IMPLANT
GLOVE BIOGEL PI INDICATOR 7.0 (GLOVE) ×2
GLOVE BIOGEL PI INDICATOR 7.5 (GLOVE) ×2
GLOVE ECLIPSE 7.5 STRL STRAW (GLOVE) ×3 IMPLANT
GOWN STRL REUS W/TWL LRG LVL3 (GOWN DISPOSABLE) ×6 IMPLANT
HIBICLENS CHG 4% 4OZ BTL (MISCELLANEOUS) ×3 IMPLANT
NEEDLE HYPO 22GX1.5 SAFETY (NEEDLE) ×3 IMPLANT
NS IRRIG 1000ML POUR BTL (IV SOLUTION) ×3 IMPLANT
PACK ABDOMINAL MINOR (CUSTOM PROCEDURE TRAY) ×3 IMPLANT
PROTECTOR NERVE ULNAR (MISCELLANEOUS) ×3 IMPLANT
SPONGE LAP 4X18 RFD (DISPOSABLE) IMPLANT
SUT VICRYL 0 UR6 27IN ABS (SUTURE) ×3 IMPLANT
SUT VICRYL 4-0 PS2 18IN ABS (SUTURE) ×3 IMPLANT
SYR CONTROL 10ML LL (SYRINGE) ×3 IMPLANT
TOWEL OR 17X24 6PK STRL BLUE (TOWEL DISPOSABLE) ×6 IMPLANT
TRAY FOLEY W/BAG SLVR 14FR (SET/KITS/TRAYS/PACK) ×3 IMPLANT

## 2018-03-27 NOTE — Progress Notes (Signed)
Post Partum Day 1 Subjective: no complaints, up ad lib, voiding, tolerating PO, + flatus and Patient was pleasant and cooperative. She denies experiencing HA, dizziness, SOB, CHX pain or fatiue. Admits to some cramping but is well controlled on medication.   Objective: Blood pressure 122/80, pulse 67, temperature 98 F (36.7 C), temperature source Oral, resp. rate 18, height 5\' 4"  (1.626 m), weight 64.6 kg, SpO2 100 %, unknown if currently breastfeeding.  Physical Exam:  General: alert, cooperative, appears stated age and no distress Lochia: appropriate Uterine Fundus: firm Incision: N/A DVT Evaluation: No evidence of DVT seen on physical exam.  Recent Labs    03/26/18 0228  HGB 9.2*  HCT 26.5*    Assessment/Plan: Contraception BTL papers have been signed, patient has been NPO sine midnight and has had no issue bottle feeding.   LOS: 1 day   Marcello Fennel 03/27/2018, 7:46 AM

## 2018-03-27 NOTE — Progress Notes (Signed)
Post Partum Day 1 Subjective: no complaints, up ad lib, voiding and tolerating PO  Objective: Blood pressure 126/85, pulse 62, temperature 98 F (36.7 C), temperature source Oral, resp. rate 18, height 5\' 4"  (1.626 m), weight 64.6 kg, SpO2 99 %, unknown if currently breastfeeding.  Physical Exam:  General: alert, cooperative and no distress Lochia: appropriate Uterine Fundus: firm DVT Evaluation: No evidence of DVT seen on physical exam. Negative Homan's sign. No cords or calf tenderness. No significant calf/ankle edema.  Recent Labs    03/26/18 0228  HGB 9.2*  HCT 26.5*    Assessment/Plan: BTL today. NPO since last night. No other complaints SW consult: currently barriers to discharge.   Risks of procedure discussed with patient including but not limited to: risk of regret, permanence of method, bleeding, infection, injury to surrounding organs and need for additional procedures.  Failure risk of 1 -2 % with increased risk of ectopic gestation if pregnancy occurs was also discussed with patient.    Truett Mainland, DO 03/27/2018 8:48 AM      LOS: 1 day   Truett Mainland 03/27/2018, 8:47 AM

## 2018-03-27 NOTE — Progress Notes (Signed)
CSW spoke with CPS worker Vikki Ports [phone] ext. [phone]) via telephone.  CPS informed CSW that there are barriers to infant discharging to MOB.  CPS will meet with MOB today at hospital to develop a safety disposition plan. CPS will follow-up with CSW after meeting with MOB.  There are barriers to infant's discharge.   Laurey Arrow, MSW, LCSW Clinical Social Work [phone]

## 2018-03-27 NOTE — Progress Notes (Signed)
CSW spoke with CPS worker Vikki Ports [phone] ext. [phone]) via telephone.  CPS informed CSW that there are barriers to infant discharging to M[facility].  CPS will meet with MOB today at hospital to develop a safety disposition plan. CPS will follow-up with CSW after meeting with MOB.  There are barriers to infant's discharge.   Laurey Arrow, MSW, LCSW Clinical Social Work [phone]

## 2018-03-27 NOTE — Anesthesia Preprocedure Evaluation (Signed)
Anesthesia Evaluation  Patient identified by MRN, date of birth, ID band Patient awake    Reviewed: Allergy & Precautions, NPO status , [name] Chart, lab work & pertinent test results  Airway Mallampati: I  TM Distance: >3 FB Neck ROM: Full    Dental  (+) Teeth Intact, Dental Advisory Given   Pulmonary Current Smoker,    breath sounds clear to auscultation       Cardiovascular hypertension,  Rhythm:Regular Rate:Normal     Neuro/Psych Anxiety Depression    GI/Hepatic negative GI ROS, Neg liver ROS,   Endo/Other  negative endocrine ROS  Renal/GU negative Renal ROS     Musculoskeletal negative musculoskeletal ROS (+)   Abdominal Normal abdominal exam  (+)   Peds  Hematology   Anesthesia Other Findings   Reproductive/Obstetrics                             Anesthesia Physical Anesthesia Plan  ASA: II  Anesthesia Plan: Epidural   Post-op Pain Management:    Induction: Intravenous  PONV Risk Score and Plan: 2 and Ondansetron and Propofol infusion  Airway Management Planned: Natural Airway and Simple Face Mask  Additional Equipment: None  Intra-op Plan:   Post-operative Plan:   Informed Consent: I have reviewed the patients History and Physical, chart, labs and discussed the procedure including the risks, benefits and alternatives for the proposed anesthesia with the patient or authorized representative who has indicated his/her understanding and acceptance.       Plan Discussed with: CRNA  Anesthesia Plan Comments:         Anesthesia Quick Evaluation

## 2018-03-27 NOTE — Op Note (Addendum)
Kristin Simmons 03/27/2018  PREOPERATIVE DIAGNOSIS:  Undesired fertility  POSTOPERATIVE DIAGNOSIS:  Undesired fertility  PROCEDURE:  Postpartum Bilateral Tubal Sterilization using Filshie Clips   SURGEON: Surgeon(s) and Role:    * Truett Mainland, DO - Primary    * Glenice Bow, DO - Fellow  ANESTHESIA:  Epidural COMPLICATIONS:  None immediate.  ESTIMATED BLOOD LOSS:  3 mL FLUIDS: 1500 cc LR. URINE OUTPUT:  In/out cath to be done at completion of case  INDICATIONS: 33 y.o. yo [id number]  with undesired fertility,status post vaginal delivery, desires permanent sterilization. Risks and benefits of procedure discussed with patient including permanence of method, bleeding, infection, injury to surrounding organs and need for additional procedures. Risk failure of 0.5-1% with increased risk of ectopic gestation if pregnancy occurs was also discussed with patient.   FINDINGS:  Normal uterus, tubes, and ovaries.  TECHNIQUE:  The patient was taken to the operating room where her epidural anesthesia was dosed up to surgical level and found to be adequate.  She was then placed in the dorsal supine position and prepped and draped in sterile fashion.  After an adequate timeout was performed, attention was turned to the patient's abdomen where a small transverse skin incision was made under the umbilical fold. The incision was taken down to the layer of fascia using the scalpel, and fascia was incised, and extended bilaterally using Mayo scissors. The peritoneum was entered in a blunt fashion. Attention was then turned to the patient's uterus, and right fallopian tube was identified and followed out to the fimbriated end.  A Filshie clip was placed on the left fallopian tube about 2 cm from the cornual attachment, with care given to incorporate the underlying mesosalpinx.  A similar process was carried out on the left side allowing for bilateral tubal sterilization.  Good hemostasis was noted  overall.The instruments were then removed from the patient's abdomen and the fascial incision was repaired with 0 Vicryl, and the skin was closed with a 3-0 Monocryl subcuticular stitch. 20cc of 0.25% Marcaine injected into subcutaneous tissue after procedure. The patient tolerated the procedure well.  Sponge, lap, and needle counts were correct times two.  The patient was then taken to the recovery room in stable condition.  Lambert Mody. Juleen China, DO OB/GYN Fellow

## 2018-03-27 NOTE — Anesthesia Postprocedure Evaluation (Signed)
Anesthesia Post Note  Patient: Kristin Simmons  Procedure(s) Performed: POST PARTUM TUBAL LIGATION (Bilateral Abdomen)     Patient location during evaluation: PACU Anesthesia Type: Epidural Level of consciousness: oriented and awake and alert Pain management: pain level controlled Vital Signs Assessment: post-procedure vital signs reviewed and stable Respiratory status: spontaneous breathing, respiratory function stable and patient connected to nasal cannula oxygen Cardiovascular status: blood pressure returned to baseline and stable Postop Assessment: no headache, no backache, no apparent nausea or vomiting, epidural receding and patient able to bend at knees Anesthetic complications: no    Last Vitals:  Vitals:   03/27/18 1115 03/27/18 1132  BP: 109/74 109/77  Pulse: 61 62  Resp: 18 18  Temp: 36.8 C 36.8 C  SpO2: 96% 100%    Last Pain:  Vitals:   03/27/18 1132  TempSrc: Oral  PainSc: 0-No pain   Pain Goal: Patients Stated Pain Goal: 3 (03/27/18 0745)              Epidural/Spinal Function Cutaneous sensation: Able to Wiggle Toes (03/27/18 1132), Patient able to flex knees: Yes (03/27/18 1132), Patient able to lift hips off bed: Yes (03/27/18 1132), Back pain beyond tenderness at insertion site: No (03/27/18 1132), Progressively worsening motor and/or sensory loss: No (03/27/18 1132), Bowel and/or bladder incontinence post epidural: No (03/27/18 1132)  Effie Berkshire

## 2018-03-27 NOTE — Anesthesia Postprocedure Evaluation (Signed)
Anesthesia Post Note  Patient: Kristin Simmons  Procedure(s) Performed: POST PARTUM TUBAL LIGATION (Bilateral Abdomen)     Patient location during evaluation: Mother Baby Anesthesia Type: Epidural Level of consciousness: awake and alert Pain management: pain level controlled Vital Signs Assessment: post-procedure vital signs reviewed and stable Respiratory status: spontaneous breathing, nonlabored ventilation and respiratory function stable Cardiovascular status: stable Postop Assessment: no headache, no backache, epidural receding, no apparent nausea or vomiting, patient able to bend at knees, adequate PO intake and able to ambulate Anesthetic complications: no    Last Vitals:  Vitals:   03/27/18 1132 03/27/18 1238  BP: 109/77 120/82  Pulse: 62 65  Resp: 18 18  Temp: 36.8 C 36.7 C  SpO2: 100% 100%    Last Pain:  Vitals:   03/27/18 1238  TempSrc: Oral  PainSc: 1    Pain Goal: Patients Stated Pain Goal: 3 (03/27/18 0745)              Epidural/Spinal Function Cutaneous sensation: Able to Wiggle Toes (03/27/18 1238), Patient able to flex knees: Yes (03/27/18 1238), Patient able to lift hips off bed: Yes (03/27/18 1238), Back pain beyond tenderness at insertion site: No (03/27/18 1238), Progressively worsening motor and/or sensory loss: No (03/27/18 1238), Bowel and/or bladder incontinence post epidural: No (03/27/18 1238)  , [place]

## 2018-03-27 NOTE — Progress Notes (Signed)
CSW met with CPS worker Vikki Ports.  CPS worker confirmed that MOB has a court hearing on Thursday (2/13) and the county is recommending trial placement with MOB for MOB's older children.  CPS communicated that MOB has been compliant with her CPS plan and at this time there are no barriers to infant discharging to [facility] when medically ready.   CPS plans to continue to offer family resources and supports after infant discharges.   Laurey Arrow, MSW, LCSW Clinical Social Work [phone]

## 2018-03-27 NOTE — Addendum Note (Signed)
Addendum  created 03/27/18 1306 by Hewitt Blade, CRNA   Clinical Note Signed

## 2018-03-27 NOTE — Transfer of Care (Signed)
Immediate Anesthesia Transfer of Care Note  Patient: Kristin Simmons  Procedure(s) Performed: POST PARTUM TUBAL LIGATION (Bilateral Abdomen)  Patient Location: PACU  Anesthesia Type:Epidural  Level of Consciousness: awake, alert  and oriented  Airway & Oxygen Therapy: Patient Spontanous Breathing and Patient connected to nasal cannula oxygen  Post-op Assessment: Report given to RN and Post -op Vital signs reviewed and stable  Post vital signs: Reviewed and stable  Last Vitals:  Vitals Value Taken Time  BP    Temp    Pulse    Resp    SpO2      Last Pain:  Vitals:   03/27/18 0745  TempSrc: Oral  PainSc: 3       Patients Stated Pain Goal: 3 (00/86/76 1950)  Complications: No apparent anesthesia complications

## 2018-03-28 MED ORDER — OXYCODONE HCL 5 MG PO TABS: 5.0000 mg | ORAL_TABLET | Freq: Four times a day (QID) | ORAL | 0 refills | Status: AC | PRN

## 2018-03-28 MED ORDER — AMLODIPINE BESYLATE 10 MG PO TABS
10.0000 mg | ORAL_TABLET | Freq: Every day | ORAL | Status: DC
  Administered 2018-03-28: 10 mg via ORAL
  Filled 2018-03-28: qty 1

## 2018-03-28 MED ORDER — IBUPROFEN 600 MG PO TABS: 600.0000 mg | ORAL_TABLET | Freq: Four times a day (QID) | ORAL | 0 refills | Status: DC

## 2018-03-28 MED ORDER — SERTRALINE HCL 50 MG PO TABS: 50.0000 mg | ORAL_TABLET | Freq: Every day | ORAL | 0 refills | Status: DC

## 2018-03-28 MED ORDER — ACETAMINOPHEN 325 MG PO TABS: 650.0000 mg | ORAL_TABLET | Freq: Four times a day (QID) | ORAL | 0 refills | Status: DC | PRN

## 2018-03-28 MED ORDER — AMLODIPINE BESYLATE 10 MG PO TABS: 10.0000 mg | ORAL_TABLET | Freq: Every day | ORAL | 1 refills | Status: DC

## 2018-03-29 ENCOUNTER — Encounter (HOSPITAL_COMMUNITY): Payer: Self-pay | Admitting: Family Medicine

## 2018-04-02 ENCOUNTER — Encounter: Payer: Self-pay | Admitting: *Deleted

## 2018-04-18 ENCOUNTER — Encounter: Payer: Self-pay | Admitting: Women's Health

## 2018-04-18 ENCOUNTER — Ambulatory Visit (INDEPENDENT_AMBULATORY_CARE_PROVIDER_SITE_OTHER): Payer: Medicaid Other | Admitting: Women's Health

## 2018-04-18 VITALS — BP 170/100 | HR 80 | Ht 64.0 in | Wt 127.0 lb

## 2018-04-18 DIAGNOSIS — R102 Pelvic and perineal pain: Secondary | ICD-10-CM | POA: Diagnosis not present

## 2018-04-18 DIAGNOSIS — B9689 Other specified bacterial agents as the cause of diseases classified elsewhere: Secondary | ICD-10-CM

## 2018-04-18 DIAGNOSIS — Z0130 Encounter for examination of blood pressure without abnormal findings: Secondary | ICD-10-CM | POA: Diagnosis not present

## 2018-04-18 DIAGNOSIS — N760 Acute vaginitis: Secondary | ICD-10-CM | POA: Diagnosis not present

## 2018-04-18 DIAGNOSIS — O165 Unspecified maternal hypertension, complicating the puerperium: Secondary | ICD-10-CM

## 2018-04-18 LAB — POCT WET PREP (WET MOUNT)
Clue Cells Wet Prep Whiff POC: POSITIVE
Trichomonas Wet Prep HPF POC: ABSENT

## 2018-04-18 MED ORDER — TRIAMTERENE-HCTZ 37.5-25 MG PO TABS: 1.0000 | ORAL_TABLET | Freq: Every day | ORAL | 1 refills | Status: DC

## 2018-04-18 MED ORDER — METRONIDAZOLE 500 MG PO TABS: 500.0000 mg | ORAL_TABLET | Freq: Two times a day (BID) | ORAL | 0 refills | Status: DC

## 2018-04-18 NOTE — Patient Instructions (Signed)
Take both blood pressure medicines at least an hour before you come to have your blood pressure checked

## 2018-04-18 NOTE — Progress Notes (Signed)
GYN VISIT Patient name: Kristin Simmons MRN [id number]  Date of birth: Mar 20, 1985 Chief Complaint:   Blood Pressure Check  History of Present Illness:   Kristin Simmons is a 33 y.o. [id number] African American female 3wks s/p SVB, being seen today for bp check. 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 Did not come for bp check as scheduled. No h/o HTN during /after previous pregnancies, same FOB. Having headaches, no visual changes/epigastric pain. Having lower pelvic pain, has been taking oxycodone that was rx'd at d/c from hospital, and hasn't been helping. Discussed with the patient and all questioned fully answered. She will call me if any problems arise. Bottlefeeding. Denies problems w/ PPD/anxiety. Had BTL in hospital, hasn't been sexually active since birth.   No LMP recorded. Review of Systems:   Pertinent items are noted in HPI Denies fever/chills, dizziness, headaches, visual disturbances, fatigue, shortness of breath, chest pain, abdominal pain, vomiting, abnormal vaginal discharge/itching/odor/irritation, problems with periods, bowel movements, urination, or intercourse unless otherwise stated above.  Pertinent History Reviewed:  Reviewed past medical,surgical, social, obstetrical and family history.  Reviewed problem list, medications and allergies. Physical Assessment:   Vitals:   04/18/18 1540 04/18/18 1541  BP: (!) 173/101 (!) 170/100  Pulse: 62 80  Weight:  127 lb (57.6 kg)  Height:  5\' 4"  (1.626 m)  Body mass index is 21.8 kg/m.       Physical Examination:   General appearance: alert, well appearing, and in no distress  Mental status: alert, oriented to person, place, and time  Skin: warm & dry   Cardiovascular: normal heart rate noted  Respiratory: normal respiratory effort, no distress  Abdomen: soft, tender bilateral lower pelvis, not in center  Pelvic: UTERUS: uterus is normal size, shape, consistency and nontender, ADNEXA:  normal adnexa in size, bilateral tenderness, Lt >Rt, and no masses Malodorous light red d/c on exam glove  Extremities: no edema, DTRs 2+, no clonus  Results for orders placed or performed in visit on 04/18/18 (from the past 24 hour(s))  POCT Wet Prep [facility])   Collection Time: 04/18/18  4:12 PM  Result Value Ref Range   Source Wet Prep POC vaginal    WBC, Wet Prep HPF POC mod    Bacteria Wet Prep HPF POC Few Few   BACTERIA WET PREP MORPHOLOGY POC     Clue Cells Wet Prep HPF POC Many (A) None   Clue Cells Wet Prep Whiff POC Positive Whiff    Yeast Wet Prep HPF POC None None   KOH Wet Prep POC     Trichomonas Wet Prep HPF POC Absent Absent    Assessment & Plan:  1) 3wks s/p SVB  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 metroNIDAZOLE (FLAGYL) 500 MG tablet    Sig: Take 1 tablet (500 mg total) by mouth 2 (two) times daily.    Dispense:  14 tablet    Refill:  0    Order Specific Question:   Supervising Provider    Answer:   Elonda Husky, LUTHER H [[id number]]  . triamterene-hydrochlorothiazide (MAXZIDE-25) 37.5-25 MG tablet    Sig: Take 1 tablet by mouth daily.  Dispense:  30 tablet    Refill:  1    Order Specific Question:   Supervising Provider    Answer:   Tania Ade H [[id number]]    Orders Placed This Encounter  Procedures  . GC/Chlamydia Probe Amp  . POCT Wet Prep [facility])    Return in about 2 days (around 04/20/2018) for bp check with nurse; then 3wks from now for pp visit.  [place], [facility] 04/18/2018 4:14 PM

## 2018-04-20 ENCOUNTER — Encounter: Payer: Self-pay | Admitting: Obstetrics & Gynecology

## 2018-05-07 ENCOUNTER — Telehealth: Payer: Self-pay | Admitting: *Deleted

## 2018-05-07 NOTE — Telephone Encounter (Signed)
Attempted to call patient regarding COVID 19 restrictions. Patient phone rang once and voicemail full.

## 2018-05-08 ENCOUNTER — Ambulatory Visit: Payer: Medicaid Other | Admitting: Women's Health

## 2018-05-08 ENCOUNTER — Telehealth: Payer: Self-pay | Admitting: Women's Health

## 2018-05-08 NOTE — Telephone Encounter (Signed)
Patient had an appointment with Knute Neu today for postpartum and missed it.  Please review, phone visit or reschedule?  [phone]

## 2018-05-08 NOTE — Telephone Encounter (Signed)
Spoke with pt letting her know she will need to reschedule postpartum visit. Advised to call tomorrow after 8:30 to schedule appt. Pt voiced understanding. [place]

## 2018-10-04 ENCOUNTER — Telehealth: Payer: Self-pay | Admitting: Women's Health

## 2018-10-04 ENCOUNTER — Telehealth: Payer: Self-pay | Admitting: *Deleted

## 2018-10-04 NOTE — Telephone Encounter (Signed)
Pt requesting a refill of her blood pressure medication.

## 2018-10-04 NOTE — Telephone Encounter (Signed)
Called patient to let her know she needs office visit for BP follow-up.  Unable to leave message.

## 2019-01-21 IMAGING — DX DG CHEST 1V
1 series · 1 of 1 positions shown · non-contrast
Comparison: 11/29/2015.

CLINICAL DATA: Cough.  Nausea, vomiting, diarrhea.

EXAM:
CHEST  1 VIEW

[chest pa]
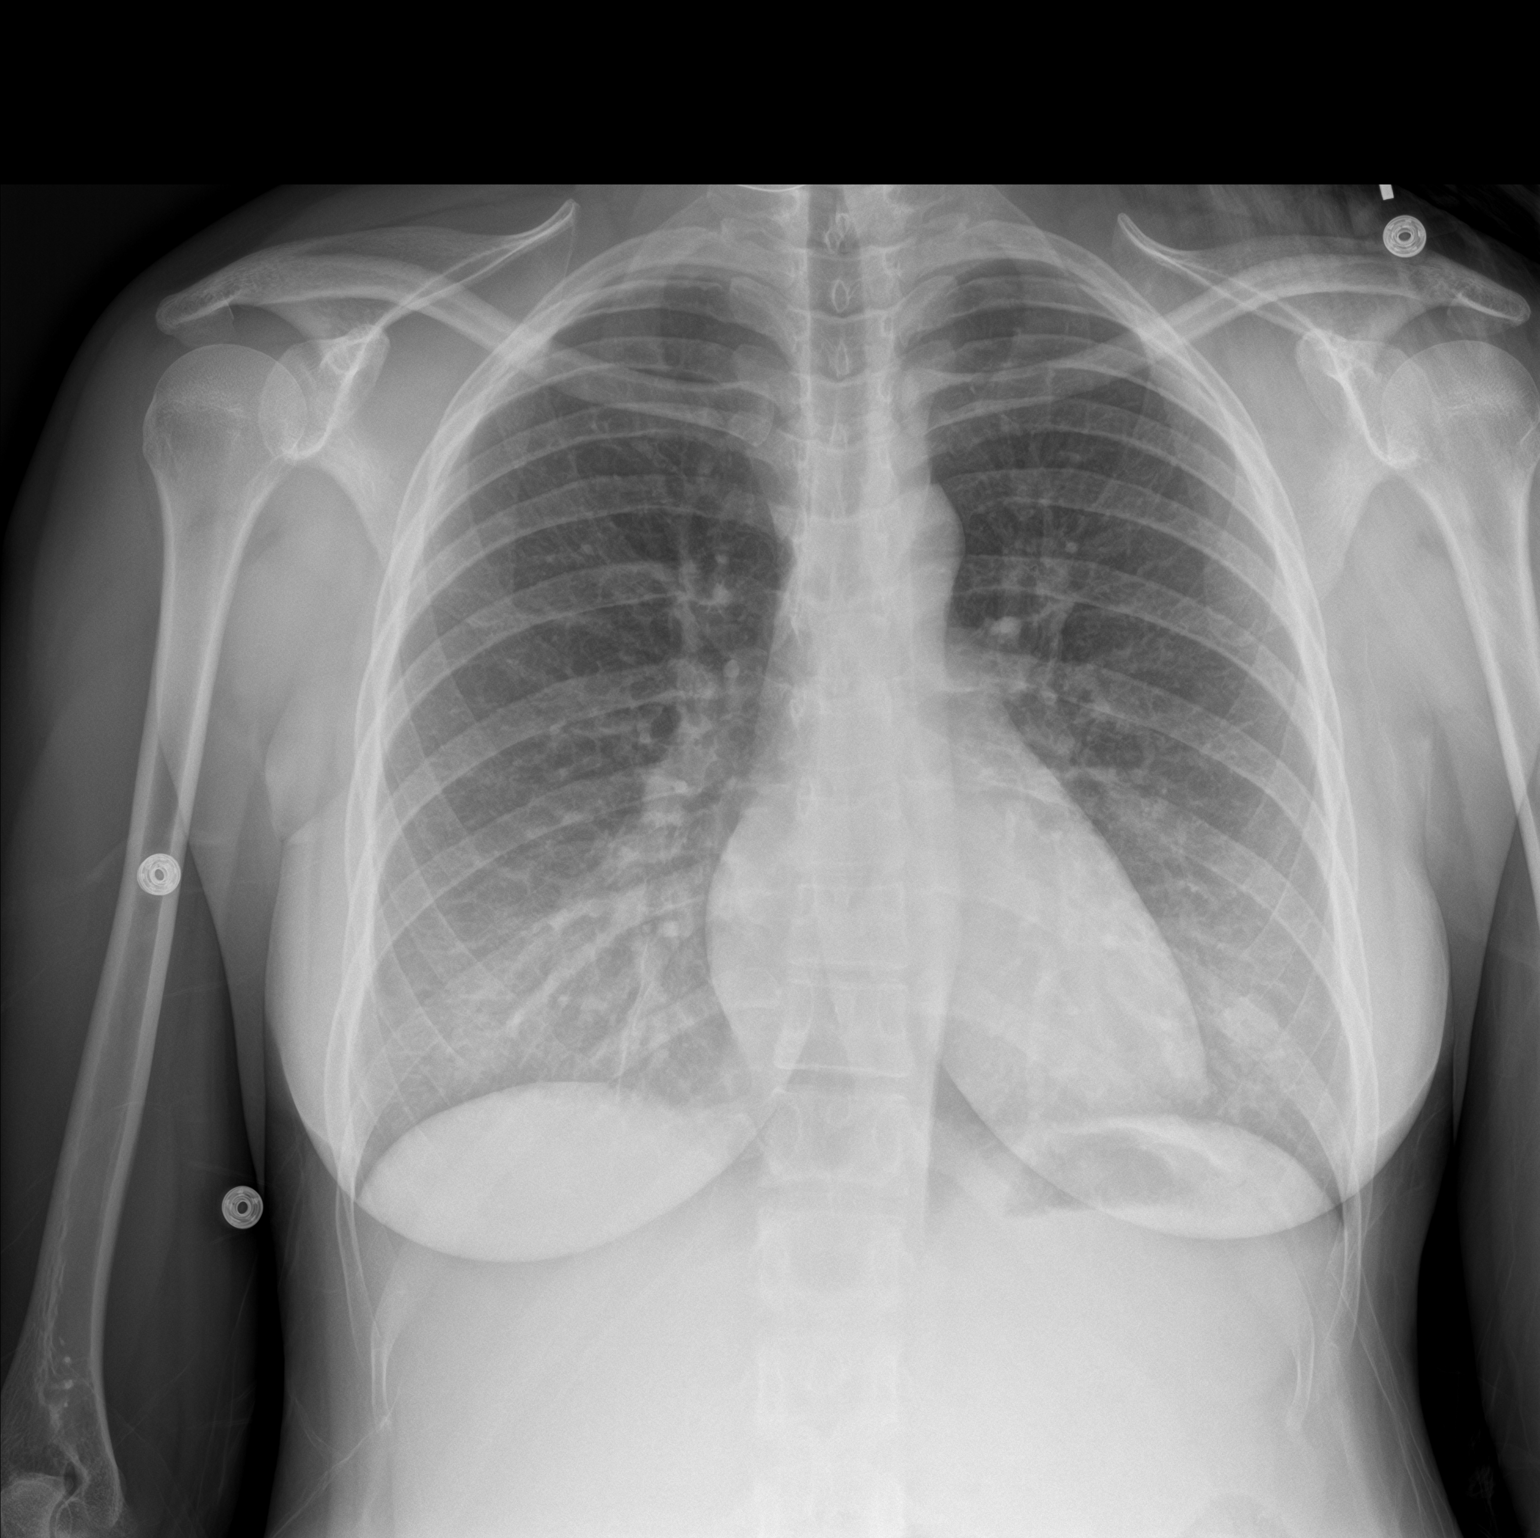

[1 of 1 positions shown; findings below may reference images not displayed]

FINDINGS: Mediastinum and hilar structures normal. Heart size normal. Nodular
opacity noted over the left lung base. This may represent a nipple
shadow. Repeat PA lateral chest x-ray with nipple markers suggested.
Mild bibasilar atelectasis/infiltrates. No pleural effusion or
pneumothorax.
IMPRESSION: 1.  Mild bibasilar atelectasis/infiltrates.

2. Nodular density noted over the left lung base. This may represent
a nipple shadow. Repeat PA and lateral chest x-ray suggested with
nipple markers.

## 2019-07-02 ENCOUNTER — Other Ambulatory Visit: Payer: Self-pay

## 2019-07-02 ENCOUNTER — Encounter (HOSPITAL_COMMUNITY): Payer: Self-pay | Admitting: Emergency Medicine

## 2019-07-02 ENCOUNTER — Emergency Department (HOSPITAL_COMMUNITY)
Admission: EM | Admit: 2019-07-02 | Discharge: 2019-07-02 | Disposition: A | Discharge status: Home / Self Care | Payer: Medicaid Other | Attending: Emergency Medicine | Admitting: Emergency Medicine

## 2019-07-02 DIAGNOSIS — A549 Gonococcal infection, unspecified: Secondary | ICD-10-CM | POA: Diagnosis not present

## 2019-07-02 DIAGNOSIS — I10 Essential (primary) hypertension: Secondary | ICD-10-CM | POA: Diagnosis not present

## 2019-07-02 DIAGNOSIS — Z202 Contact with and (suspected) exposure to infections with a predominantly sexual mode of transmission: Secondary | ICD-10-CM

## 2019-07-02 DIAGNOSIS — Z79899 Other long term (current) drug therapy: Secondary | ICD-10-CM | POA: Insufficient documentation

## 2019-07-02 DIAGNOSIS — Z87891 Personal history of nicotine dependence: Secondary | ICD-10-CM | POA: Diagnosis not present

## 2019-07-02 DIAGNOSIS — Z91040 Latex allergy status: Secondary | ICD-10-CM | POA: Insufficient documentation

## 2019-07-02 LAB — PREGNANCY, URINE: Preg Test, Ur: NEGATIVE

## 2019-07-02 LAB — URINALYSIS, ROUTINE W REFLEX MICROSCOPIC
Bilirubin Urine: NEGATIVE
Glucose, UA: NEGATIVE mg/dL
Hgb urine dipstick: NEGATIVE
Ketones, ur: NEGATIVE mg/dL
Leukocytes,Ua: NEGATIVE
Nitrite: NEGATIVE
Protein, ur: NEGATIVE mg/dL
Specific Gravity, Urine: 1.012 (ref 1.005–1.030)
pH: 8 (ref 5.0–8.0)

## 2019-07-02 MED ORDER — CEFTRIAXONE SODIUM 500 MG IJ SOLR
500.0000 mg | Freq: Once | INTRAMUSCULAR | Status: AC
  Administered 2019-07-02: 500 mg via INTRAMUSCULAR
  Filled 2019-07-02: qty 500

## 2019-07-02 MED ORDER — AZITHROMYCIN 250 MG PO TABS
1000.0000 mg | ORAL_TABLET | Freq: Once | ORAL | Status: AC
  Administered 2019-07-02: 1000 mg via ORAL
  Filled 2019-07-02: qty 4

## 2019-07-02 MED ORDER — LIDOCAINE HCL (PF) 1 % IJ SOLN
INTRAMUSCULAR | Status: AC
  Filled 2019-07-02: qty 2

## 2019-07-02 NOTE — Discharge Instructions (Addendum)
We have sent your urine for we have tested your urine for gonorrhea and chlamydia today.  The results will take a couple of days to come back.  We have treated you for gonorrhea and chlamydia.  Please follow-up with your primary care doctor.  Return to the emergency department if you have any new or worsening symptoms

## 2019-07-02 NOTE — ED Triage Notes (Signed)
Pt reports she would like to be checked for STD, reports she has not had sex with a specific partner in about one month but she found his paperwork where he was tested for STDs; reports hx of chlamydia; no symptoms

## 2019-07-02 NOTE — ED Provider Notes (Signed)
[facility] EMERGENCY DEPARTMENT Provider Note   CSN: [id number] Arrival date & time: 07/02/19  1656     History Chief Complaint  Patient presents with  . Exposure to STD    Kristin Simmons is a 34 y.o. female.  Patient is a 34 year old female with a history of anemia, anxiety, hypertension presenting to the emergency department for STD check.  Patient reports that she had sexual intercourse with her female partner 1 month ago.  She reports recently that she found out that her partner had cheated on her prior to their sexual intercourse.  She reports she would like to be tested.  She denies any symptoms including vaginal discharge, vaginal bleeding, pelvic pain, dysuria.  She reports that she does not want a pelvic exam and would like her testing to be through her urine.  She declines any HIV testing as she reports she sees the health department for this        Past Medical History:  Diagnosis Date  . Anemia   . Anxiety   . Cancer (HCC)    cervical  . Depression   . Hypertension   . Vaginal Pap smear, abnormal     Patient Active Problem List   Diagnosis Date Noted  . PROM (premature rupture of membranes) 03/26/2018  . Postpartum hypertension 03/26/2018  . Trichomoniasis 12/05/2017  . High risk social situation  12/05/2017  . Supervision of normal pregnancy 10/19/2017  . History of cocaine use 10/19/2017  . Adjustment disorder with depressed mood 09/13/2017  . Anxiety disorder, unspecified 09/12/2017  . Abnormal Pap smear of cervix 01/18/2016  . Smoker 01/13/2016  . Depression 01/13/2016  . Alcohol use affecting pregnancy, first trimester 01/13/2016  . History of preterm delivery, currently pregnant 01/13/2016  . Underweight 01/13/2016    Past Surgical History:  Procedure Laterality Date  . NO PAST SURGERIES    . TUBAL LIGATION Bilateral 03/27/2018   Procedure: POST PARTUM TUBAL LIGATION;  Surgeon: Truett Mainland, DO;  Location: [place];  Service:  Gynecology;  Laterality: Bilateral;     OB History    Gravida  6   Para  5   Term  2   Preterm  3   AB  1   Living  5     SAB  1   TAB      Ectopic      Multiple  0   Live Births  5           Family History  Problem Relation Age of Onset  . Alzheimer's disease Paternal Grandmother   . Cancer Maternal Grandmother   . Cancer Maternal Grandfather   . Hypertension Father   . Anemia Mother   . Hypertension Mother   . Thyroid disease Mother   . Diabetes Sister   . Hypertension Sister   . Mental illness Brother   . Asthma Daughter   . Bronchitis Daughter   . Asthma Daughter   . Bronchitis Daughter   . Asthma Daughter   . Bronchitis Daughter     Social History   Tobacco Use  . Smoking status: Former Smoker    Packs/day: 0.25    Years: 15.00    Pack years: 3.75    Types: Cigarettes  . Smokeless tobacco: Never Used  Substance Use Topics  . Alcohol use: Not Currently    Alcohol/week: 1.0 - 2.0 standard drinks    Types: 1 - 2 Cans of beer per week  .  Drug use: No    Types: Marijuana    Comment: last use 2007    Home Medications Prior to Admission medications   Medication Sig Start Date End Date Taking? Authorizing Provider  amLODipine (NORVASC) 10 MG tablet Take 1 tablet (10 mg total) by mouth daily. 03/28/18   Roma Schanz, CNM  metroNIDAZOLE (FLAGYL) 500 MG tablet Take 1 tablet (500 mg total) by mouth 2 (two) times daily. 04/18/18   Roma Schanz, CNM  prenatal vitamin w/FE, FA (PRENATAL 1 + 1) 27-1 MG TABS tablet Take 1 tablet by mouth daily at 12 noon. Patient not taking: Reported on 04/18/2018 12/08/15   Derrek Monaco A, NP  sertraline (ZOLOFT) 50 MG tablet Take 1 tablet (50 mg total) by mouth at bedtime. 03/28/18   Daisy Floro, DO  triamterene-hydrochlorothiazide (MAXZIDE-25) 37.5-25 MG tablet Take 1 tablet by mouth daily. 04/18/18   Roma Schanz, CNM    Allergies    Latex  Review of Systems   Review of Systems    Constitutional: Negative for fever.  Gastrointestinal: Negative for abdominal pain and nausea.  Genitourinary: Negative for dysuria, flank pain, menstrual problem, pelvic pain, vaginal bleeding and vaginal discharge.  Musculoskeletal: Negative for back pain.    Physical Exam Updated Vital Signs BP (!) 158/107 (BP Location: Left Arm)   Pulse 83   Temp 98.4 F (36.9 C) (Oral)   Resp 16   Ht 5\' 4"  (1.626 m)   Wt 49.9 kg   LMP 06/24/2019   SpO2 98%   BMI 18.88 kg/m   Physical Exam Vitals and nursing note reviewed.  Constitutional:      General: She is not in acute distress.    Appearance: Normal appearance. She is not ill-appearing, toxic-appearing or diaphoretic.  HENT:     Head: Normocephalic.  Eyes:     Conjunctiva/sclera: Conjunctivae normal.  Cardiovascular:     Rate and Rhythm: Normal rate and regular rhythm.  Pulmonary:     Effort: Pulmonary effort is normal.  Skin:    General: Skin is dry.  Neurological:     Mental Status: She is alert.  Psychiatric:        Mood and Affect: Mood normal.     ED Results / Procedures / Treatments   Labs (all labs ordered are listed, but only abnormal results are displayed) Labs Reviewed  URINALYSIS, ROUTINE W REFLEX MICROSCOPIC  PREGNANCY, URINE  GC/CHLAMYDIA PROBE AMP ([place]) NOT AT [facility]    EKG None  Radiology No results found.  Procedures Procedures (including critical care time)  Medications Ordered in ED Medications  lidocaine (PF) (XYLOCAINE) 1 % injection (has no administration in time range)  cefTRIAXone (ROCEPHIN) injection 500 mg (500 mg Intramuscular Given 07/02/19 1802)  azithromycin (ZITHROMAX) tablet 1,000 mg (1,000 mg Oral Given 07/02/19 1802)    ED Course  I have reviewed the triage vital signs and the nursing notes.  Pertinent labs & imaging results that were available during my care of the patient were reviewed by me and considered in my medical decision making (see chart for  details).  Clinical Course as of Jul 01 1808  Tue Jul 02, 2019  1743 Patient presents today to the emergency department for STD testing.  She is asymptomatic.  She declines pelvic exam and wants testing through her urine.  I explained to her the limitations of testing through her urine versus a pelvic exam.  She understands.  She also declines HIV testing.  She  did opt to be treated for gonorrhea and chlamydia prior to being discharged.   [KM]    Clinical Course User Index [KM] Kristine Royal   MDM Rules/Calculators/A&P                      Based on review of vitals, medical screening exam, lab work and/or imaging, there does not appear to be an acute, emergent etiology for the patient's symptoms. Counseled pt on good return precautions and encouraged both PCP and ED follow-up as needed.  Prior to discharge, I also discussed incidental imaging findings with patient in detail and advised appropriate, recommended follow-up in detail.  Clinical Impression: 1. STD exposure     Disposition: Discharge  Prior to providing a prescription for a controlled substance, I independently reviewed the patient's recent prescription history on the [place]. The patient had no recent or regular prescriptions and was deemed appropriate for a brief, less than 3 day prescription of narcotic for acute analgesia.  This note was prepared with assistance of Systems analyst. Occasional wrong-word or sound-a-like substitutions may have occurred due to the inherent limitations of voice recognition software.  Final Clinical Impression(s) / ED Diagnoses Final diagnoses:  STD exposure    Rx / DC Orders ED Discharge Orders    None       Kristine Royal 07/02/19 1810    Margette Fast, MD 07/03/19 1241

## 2019-07-03 ENCOUNTER — Other Ambulatory Visit: Payer: Self-pay | Admitting: Women's Health

## 2019-07-05 LAB — GC/CHLAMYDIA PROBE AMP (~~LOC~~) NOT AT ARMC
Chlamydia: NEGATIVE
Comment: NEGATIVE
Comment: NORMAL
Neisseria Gonorrhea: POSITIVE — AB

## 2021-03-14 ENCOUNTER — Other Ambulatory Visit: Payer: Self-pay

## 2021-03-14 ENCOUNTER — Emergency Department (HOSPITAL_COMMUNITY): Payer: Medicaid Other

## 2021-03-14 ENCOUNTER — Emergency Department (HOSPITAL_COMMUNITY)
Admission: EM | Admit: 2021-03-14 | Discharge: 2021-03-14 | Disposition: A | Discharge status: Home / Self Care | Payer: Medicaid Other | Attending: Emergency Medicine | Admitting: Emergency Medicine

## 2021-03-14 ENCOUNTER — Encounter (HOSPITAL_COMMUNITY): Payer: Self-pay | Admitting: Emergency Medicine

## 2021-03-14 DIAGNOSIS — Z91040 Latex allergy status: Secondary | ICD-10-CM | POA: Diagnosis not present

## 2021-03-14 DIAGNOSIS — I160 Hypertensive urgency: Secondary | ICD-10-CM | POA: Insufficient documentation

## 2021-03-14 DIAGNOSIS — Z8541 Personal history of malignant neoplasm of cervix uteri: Secondary | ICD-10-CM | POA: Insufficient documentation

## 2021-03-14 DIAGNOSIS — I10 Essential (primary) hypertension: Secondary | ICD-10-CM | POA: Diagnosis not present

## 2021-03-14 DIAGNOSIS — R209 Unspecified disturbances of skin sensation: Secondary | ICD-10-CM | POA: Insufficient documentation

## 2021-03-14 DIAGNOSIS — M546 Pain in thoracic spine: Secondary | ICD-10-CM | POA: Diagnosis present

## 2021-03-14 DIAGNOSIS — Z79899 Other long term (current) drug therapy: Secondary | ICD-10-CM | POA: Insufficient documentation

## 2021-03-14 DIAGNOSIS — N9489 Other specified conditions associated with female genital organs and menstrual cycle: Secondary | ICD-10-CM | POA: Diagnosis not present

## 2021-03-14 LAB — RAPID URINE DRUG SCREEN, HOSP PERFORMED
Amphetamines: NOT DETECTED
Barbiturates: NOT DETECTED
Benzodiazepines: POSITIVE — AB
Cocaine: NOT DETECTED
Opiates: NOT DETECTED
Tetrahydrocannabinol: NOT DETECTED

## 2021-03-14 LAB — CBC WITH DIFFERENTIAL/PLATELET
Abs Immature Granulocytes: 0.02 10*3/uL (ref 0.00–0.07)
Basophils Absolute: 0.1 10*3/uL (ref 0.0–0.1)
Basophils Relative: 1 %
Eosinophils Absolute: 0.1 10*3/uL (ref 0.0–0.5)
Eosinophils Relative: 2 %
HCT: 36.8 % (ref 36.0–46.0)
Hemoglobin: 12.6 g/dL (ref 12.0–15.0)
Immature Granulocytes: 0 %
Lymphocytes Relative: 34 %
Lymphs Abs: 2.3 10*3/uL (ref 0.7–4.0)
MCH: 34.4 pg — ABNORMAL HIGH (ref 26.0–34.0)
MCHC: 34.2 g/dL (ref 30.0–36.0)
MCV: 100.5 fL — ABNORMAL HIGH (ref 80.0–100.0)
Monocytes Absolute: 0.3 10*3/uL (ref 0.1–1.0)
Monocytes Relative: 4 %
Neutro Abs: 4.1 10*3/uL (ref 1.7–7.7)
Neutrophils Relative %: 59 %
Platelets: 317 10*3/uL (ref 150–400)
RBC: 3.66 MIL/uL — ABNORMAL LOW (ref 3.87–5.11)
RDW: 13.8 % (ref 11.5–15.5)
WBC: 7 10*3/uL (ref 4.0–10.5)
nRBC: 0 % (ref 0.0–0.2)

## 2021-03-14 LAB — COMPREHENSIVE METABOLIC PANEL
ALT: 47 U/L — ABNORMAL HIGH (ref 0–44)
AST: 60 U/L — ABNORMAL HIGH (ref 15–41)
Albumin: 3.5 g/dL (ref 3.5–5.0)
Alkaline Phosphatase: 66 U/L (ref 38–126)
Anion gap: 5 (ref 5–15)
BUN: 8 mg/dL (ref 6–20)
CO2: 24 mmol/L (ref 22–32)
Calcium: 8.2 mg/dL — ABNORMAL LOW (ref 8.9–10.3)
Chloride: 103 mmol/L (ref 98–111)
Creatinine, Ser: 0.62 mg/dL (ref 0.44–1.00)
GFR, Estimated: >60 mL/min (ref >60)
Glucose, Bld: 94 mg/dL (ref 70–99)
Potassium: 4.1 mmol/L (ref 3.5–5.1)
Sodium: 132 mmol/L — ABNORMAL LOW (ref 135–145)
Total Bilirubin: 0.5 mg/dL (ref 0.3–1.2)
Total Protein: 7 g/dL (ref 6.5–8.1)

## 2021-03-14 LAB — I-STAT CHEM 8, ED
BUN: 6 mg/dL (ref 6–20)
Calcium, Ion: 1.15 mmol/L (ref 1.15–1.40)
Chloride: 101 mmol/L (ref 98–111)
Creatinine, Ser: 0.7 mg/dL (ref 0.44–1.00)
Glucose, Bld: 93 mg/dL (ref 70–99)
HCT: 44 % (ref 36.0–46.0)
Hemoglobin: 15 g/dL (ref 12.0–15.0)
Potassium: 4.3 mmol/L (ref 3.5–5.1)
Sodium: 136 mmol/L (ref 135–145)
TCO2: 24 mmol/L (ref 22–32)

## 2021-03-14 LAB — TROPONIN I (HIGH SENSITIVITY): Troponin I (High Sensitivity): <2 ng/L (ref <18)

## 2021-03-14 LAB — I-STAT BETA HCG BLOOD, ED (MC, WL, AP ONLY): I-stat hCG, quantitative: <5 m[IU]/mL (ref <5)

## 2021-03-14 MED ORDER — HYDROMORPHONE HCL 1 MG/ML IJ SOLN
1.0000 mg | Freq: Once | INTRAMUSCULAR | Status: AC
  Administered 2021-03-14: 1 mg via INTRAVENOUS
  Filled 2021-03-14: qty 1

## 2021-03-14 MED ORDER — TRIAMTERENE-HCTZ 37.5-25 MG PO TABS: 1.0000 | ORAL_TABLET | Freq: Every day | ORAL | 1 refills | Status: DC

## 2021-03-14 MED ORDER — IBUPROFEN 600 MG PO TABS: 600.0000 mg | ORAL_TABLET | Freq: Four times a day (QID) | ORAL | 0 refills | Status: DC | PRN

## 2021-03-14 MED ORDER — KETOROLAC TROMETHAMINE 15 MG/ML IJ SOLN
15.0000 mg | Freq: Once | INTRAMUSCULAR | Status: AC
  Administered 2021-03-14: 15 mg via INTRAVENOUS
  Filled 2021-03-14: qty 1

## 2021-03-14 MED ORDER — METHOCARBAMOL 500 MG PO TABS: 500.0000 mg | ORAL_TABLET | Freq: Three times a day (TID) | ORAL | 0 refills | Status: DC | PRN

## 2021-03-14 MED ORDER — AMLODIPINE BESYLATE 10 MG PO TABS: 10.0000 mg | ORAL_TABLET | Freq: Every day | ORAL | 1 refills | Status: DC

## 2021-03-14 MED ORDER — AMLODIPINE BESYLATE 5 MG PO TABS
10.0000 mg | ORAL_TABLET | Freq: Once | ORAL | Status: AC
  Administered 2021-03-14: 10 mg via ORAL
  Filled 2021-03-14: qty 2

## 2021-03-14 MED ORDER — IOHEXOL 350 MG/ML SOLN
100.0000 mL | Freq: Once | INTRAVENOUS | Status: AC | PRN
  Administered 2021-03-14: 100 mL via INTRAVENOUS

## 2021-03-14 NOTE — Discharge Instructions (Signed)
It is important to take the blood pressure medicines prescribed to keep your blood pressure under control.  You are also being prescribed ibuprofen and a muscle relaxer.  Do not drive, drink alcohol, or taking combination with other medicines.  You may still take Tylenol for pain.  If at any point you develop new or worsening back pain, chest pain, numbness or weakness in the arms or legs, incontinence, fever, or any other new/concerning symptoms the return to the ER for evaluation.

## 2021-03-14 NOTE — ED Notes (Signed)
Patient reports itching to back after pain medication. MD made aware.

## 2021-03-14 NOTE — ED Triage Notes (Signed)
Pt to the ED with back pain and right arm numbness and tingling that began last night.

## 2021-03-14 NOTE — ED Provider Notes (Signed)
[facility] EMERGENCY DEPARTMENT Provider Note   CSN: [id number] Arrival date & time: 03/14/21  1359     History  Chief Complaint  Patient presents with   Back Pain    Kristin Simmons is a 36 y.o. female.  HPI 36 year old female presents with severe right-sided thoracic back pain.  For started around 4 AM.  Pain feels like someone hit her in the back.  At around 8 AM she noticed that she was getting some tingling in her right arm depending on position.  Tingling seems to be whenever she is laying flat and her arm is hanging low.  Otherwise she notes that when she lifts her right arm above her head it seems like her back pain gets a little better.  There is no headache, neck pain, abdominal pain.  No weakness in her extremities and there is no incontinence.  There is no real numbness as opposed to the tingling she gets on and off.  Sitting upright like she is when I saw her is the best position.  Her chest has been hurting a little bit but she thinks it is from holding her shoulder above her head.  She has tried a heating pad but nothing else for pain.  She denies she could be pregnant.  When asked about social history she endorses previous cocaine use years ago but denies any recent cocaine use. No history of cancer or weight loss. She has a history of HTN but states she does not take medicine for this  Home Medications Prior to Admission medications   Medication Sig Start Date End Date Taking? Authorizing Provider  ibuprofen (ADVIL) 600 MG tablet Take 1 tablet (600 mg total) by mouth every 6 (six) hours as needed (for pain). 03/14/21  Yes Sherwood Gambler, MD  methocarbamol (ROBAXIN) 500 MG tablet Take 1 tablet (500 mg total) by mouth every 8 (eight) hours as needed for muscle spasms. 03/14/21  Yes Sherwood Gambler, MD  amLODipine (NORVASC) 10 MG tablet Take 1 tablet (10 mg total) by mouth daily. 03/14/21   Sherwood Gambler, MD  prenatal vitamin w/FE, FA (PRENATAL 1 + 1) 27-1 MG TABS tablet  Take 1 tablet by mouth daily at 12 noon. Patient not taking: Reported on 04/18/2018 12/08/15   Estill Dooms, NP  triamterene-hydrochlorothiazide (MAXZIDE-25) 37.5-25 MG tablet Take 1 tablet by mouth daily. 03/14/21   Sherwood Gambler, MD      Allergies    Latex    Review of Systems   Review of Systems  Respiratory:  Negative for shortness of breath.   Cardiovascular:  Positive for chest pain.  Gastrointestinal:  Negative for abdominal pain.  Musculoskeletal:  Positive for back pain. Negative for neck pain.  Neurological:  Positive for numbness. Negative for weakness and headaches.   Physical Exam Updated Vital Signs BP (!) 177/118    Pulse 64    Temp 98.3 F (36.8 C) (Oral)    Resp 14    Ht 5\' 4"  (1.626 m)    LMP 02/13/2021 (Approximate)    SpO2 100%    BMI 18.88 kg/m  Physical Exam Vitals and nursing note reviewed.  Constitutional:      Appearance: She is well-developed.  HENT:     Head: Normocephalic and atraumatic.  Cardiovascular:     Rate and Rhythm: Normal rate and regular rhythm.     Pulses:          Radial pulses are 2+ on the right side and 2+ on  the left side.     Heart sounds: Normal heart sounds.  Pulmonary:     Effort: Pulmonary effort is normal.     Breath sounds: Normal breath sounds.  Abdominal:     Palpations: Abdomen is soft.     Tenderness: There is no abdominal tenderness.  Musculoskeletal:     Cervical back: No tenderness. No spinous process tenderness or muscular tenderness.     Thoracic back: No tenderness.  Skin:    General: Skin is warm and dry.  Neurological:     Mental Status: She is alert.     Comments: 5/5 strength in all 4 extremities. Grossly normal sensation    ED Results / Procedures / Treatments   Labs (all labs ordered are listed, but only abnormal results are displayed) Labs Reviewed  COMPREHENSIVE METABOLIC PANEL - Abnormal; Notable for the following components:      Result Value   Sodium 132 (*)    Calcium 8.2 (*)    AST  60 (*)    ALT 47 (*)    All other components within normal limits  CBC WITH DIFFERENTIAL/PLATELET - Abnormal; Notable for the following components:   RBC 3.66 (*)    MCV 100.5 (*)    MCH 34.4 (*)    All other components within normal limits  RAPID URINE DRUG SCREEN, HOSP PERFORMED - Abnormal; Notable for the following components:   Benzodiazepines POSITIVE (*)    All other components within normal limits  I-STAT BETA HCG BLOOD, ED ([facility], [facility], [facility] ONLY)  I-STAT CHEM 8, ED  TROPONIN I (HIGH SENSITIVITY)    EKG EKG Interpretation  Date/Time:  Sunday March 14 2021 15:43:51 EST Ventricular Rate:  90 PR Interval:  143 QRS Duration: 69 QT Interval:  392 QTC Calculation: 480 R Axis:   74 Text Interpretation: Sinus rhythm Probable left atrial enlargement RSR' in V1 or V2, probably normal variant Borderline T abnormalities, anterior leads ST elev, probable normal early repol pattern Borderline prolonged QT interval Confirmed by Sherwood Gambler [phone]) on 03/14/2021 4:23:22 PM  Radiology DG Chest 2 View  Result Date: 03/14/2021 CLINICAL DATA:  Right-sided thoracic pain. EXAM: CHEST - 2 VIEW COMPARISON:  January 04, 2018 FINDINGS: The heart size and mediastinal contours are within normal limits. No focal consolidation. No pleural effusion. No pneumothorax. Nodular opacities project over the bilateral hemithorax, consistent with nipple shadows. The visualized skeletal structures are unremarkable. IMPRESSION: No active cardiopulmonary disease. Electronically Signed   By: Dahlia Bailiff M.D.   On: 03/14/2021 15:46   CT Angio Chest/Abd/Pel for Dissection W and/or Wo Contrast  Result Date: 03/14/2021 CLINICAL DATA:  Acute aortic syndrome suspected. back pain and right arm numbness and tingling that began last night. Hx of cervical cancer, HTN. EXAM: CT ANGIOGRAPHY CHEST, ABDOMEN AND PELVIS TECHNIQUE: Non-contrast CT of the chest was initially obtained. Multidetector CT imaging through the chest,  abdomen and pelvis was performed using the standard protocol during bolus administration of intravenous contrast. Multiplanar reconstructed images and MIPs were obtained and reviewed to evaluate the vascular anatomy. RADIATION DOSE REDUCTION: This exam was performed according to the departmental dose-optimization program which includes automated exposure control, adjustment of the mA and/or kV according to patient size and/or use of iterative reconstruction technique. CONTRAST:  153mL OMNIPAQUE IOHEXOL 350 MG/ML SOLN COMPARISON:  None. FINDINGS: CTA CHEST FINDINGS Cardiovascular: Preferential opacification of the thoracic aorta. No evidence of thoracic aortic aneurysm or dissection. Normal heart size. No pericardial effusion. The main pulmonary artery is  normal in caliber. No central or segmental pulmonary embolus. Mediastinum/Nodes: No enlarged mediastinal, hilar, or axillary lymph nodes. Thyroid gland, trachea, and esophagus demonstrate no significant findings. Lungs/Pleura: No focal consolidation. No pulmonary nodule. No pulmonary mass. No pleural effusion. No pneumothorax. Musculoskeletal: No chest wall abnormality. No suspicious lytic or blastic osseous lesions. No acute displaced fracture. Review of the MIP images confirms the above findings. CTA ABDOMEN AND PELVIS FINDINGS VASCULAR Aorta: Atherosclerotic plaque. Normal caliber aorta without aneurysm, dissection, vasculitis or significant stenosis. Celiac: Patent without evidence of aneurysm, dissection, vasculitis or significant stenosis. SMA: Patent without evidence of aneurysm, dissection, vasculitis or significant stenosis. Renals: Both renal arteries are patent without evidence of aneurysm, dissection, vasculitis, fibromuscular dysplasia or significant stenosis. IMA: Patent without evidence of aneurysm, dissection, vasculitis or significant stenosis. Inflow: Patent without evidence of aneurysm, dissection, vasculitis or significant stenosis. Veins: No  obvious venous abnormality within the limitations of this arterial phase study. Review of the MIP images confirms the above findings. NON-VASCULAR Hepatobiliary: No focal liver abnormality. No gallstones, gallbladder wall thickening, or pericholecystic fluid. No biliary dilatation. Pancreas: No focal lesion. Normal pancreatic contour. No surrounding inflammatory changes. No main pancreatic ductal dilatation. Spleen: Normal in size without focal abnormality. Adrenals/Urinary Tract: No adrenal nodule bilaterally. Bilateral kidneys enhance symmetrically. No hydronephrosis. No hydroureter. The urinary bladder is unremarkable. Stomach/Bowel: Stomach is within normal limits. No evidence of bowel wall thickening or dilatation. The appendix is not definitely identified with no inflammatory changes in the right lower quadrant to suggest acute appendicitis. Lymphatic: No lymphadenopathy. Reproductive: Bilateral tubal ligation. Prominent sized uterus. Otherwise uterus and bilateral adnexa are unremarkable. Other: Nonspecific trace free fluid within the pelvis. No intraperitoneal free gas. No organized fluid collection. Musculoskeletal: No abdominal wall hernia or abnormality. No suspicious lytic or blastic osseous lesions. No acute displaced fracture. Multilevel degenerative changes of the spine. Review of the MIP images confirms the above findings. IMPRESSION: 1. No acute aortic abnormality. 2. No pulmonary embolus. 3. No acute intrathoracic, intra-abdominal, intrapelvic abnormality. Electronically Signed   By: Iven Finn M.D.   On: 03/14/2021 17:10    Procedures Procedures    Medications Ordered in ED Medications  HYDROmorphone (DILAUDID) injection 1 mg (1 mg Intravenous Given 03/14/21 1535)  iohexol (OMNIPAQUE) 350 MG/ML injection 100 mL (100 mLs Intravenous Contrast Given 03/14/21 1636)  ketorolac (TORADOL) 15 MG/ML injection 15 mg (15 mg Intravenous Given 03/14/21 1753)  amLODipine (NORVASC) tablet 10 mg (10  mg Oral Given 03/14/21 1839)    ED Course/ Medical Decision Making/ A&P                           Medical Decision Making Amount and/or Complexity of Data Reviewed Labs: ordered. Radiology: ordered.  Risk Prescription drug management.   Patient ultimately appears to probably have muscular back pain. With her intermittent tingling there could be some radiculopathy. However her neuro exam is benign. Intact distal pulses. Given her HTN and no reproducible symptoms, workup for aortic dissection pursued. I have viewed CXR and CT images, no acute pneumothorax, PE or dissection. Trop negative and ECG without ischemia on my view. Thus ACS seems quite unlikely.   Labs otherwise without acute abnormalities that would cause her symptoms. She is not pregnant.  Currently pain is much better. I think treatment of her HTN with meds she's supposed to be on is prudent. I have prescribed these, in addition to Ibuprofen and robaxin. Doubt spinal cord emergency. Appears stable  for d/c home with return precautions.         Final Clinical Impression(s) / ED Diagnoses Final diagnoses:  Acute right-sided thoracic back pain  Hypertensive urgency    Rx / DC Orders ED Discharge Orders          Ordered    amLODipine (NORVASC) 10 MG tablet  Daily        03/14/21 1828    triamterene-hydrochlorothiazide (MAXZIDE-25) 37.5-25 MG tablet  Daily        03/14/21 1828    ibuprofen (ADVIL) 600 MG tablet  Every 6 hours PRN        03/14/21 1828    methocarbamol (ROBAXIN) 500 MG tablet  Every 8 hours PRN        03/14/21 1828              Sherwood Gambler, MD 03/15/21 [phone]

## 2021-03-17 ENCOUNTER — Other Ambulatory Visit: Payer: Self-pay

## 2021-03-17 ENCOUNTER — Encounter (HOSPITAL_COMMUNITY): Payer: Self-pay | Admitting: *Deleted

## 2021-03-17 ENCOUNTER — Emergency Department (HOSPITAL_COMMUNITY)
Admission: EM | Admit: 2021-03-17 | Discharge: 2021-03-17 | Disposition: A | Discharge status: Home / Self Care | Payer: Medicaid Other | Attending: Emergency Medicine | Admitting: Emergency Medicine

## 2021-03-17 DIAGNOSIS — Z79899 Other long term (current) drug therapy: Secondary | ICD-10-CM | POA: Diagnosis not present

## 2021-03-17 DIAGNOSIS — I160 Hypertensive urgency: Secondary | ICD-10-CM

## 2021-03-17 DIAGNOSIS — R202 Paresthesia of skin: Secondary | ICD-10-CM | POA: Diagnosis not present

## 2021-03-17 DIAGNOSIS — M546 Pain in thoracic spine: Secondary | ICD-10-CM

## 2021-03-17 DIAGNOSIS — M25511 Pain in right shoulder: Secondary | ICD-10-CM | POA: Insufficient documentation

## 2021-03-17 MED ORDER — OMEPRAZOLE 20 MG PO CPDR: 20.0000 mg | DELAYED_RELEASE_CAPSULE | Freq: Every day | ORAL | 0 refills | Status: DC

## 2021-03-17 MED ORDER — LIDOCAINE 5 % EX PTCH: 1.0000 | MEDICATED_PATCH | CUTANEOUS | 0 refills | Status: DC

## 2021-03-17 MED ORDER — AMLODIPINE BESYLATE 5 MG PO TABS
10.0000 mg | ORAL_TABLET | Freq: Once | ORAL | Status: AC
Start: 2021-03-17 — End: 2021-03-17
  Administered 2021-03-17: 10 mg via ORAL
  Filled 2021-03-17: qty 2

## 2021-03-17 MED ORDER — OXYCODONE-ACETAMINOPHEN 5-325 MG PO TABS
1.0000 | ORAL_TABLET | Freq: Once | ORAL | Status: AC
  Administered 2021-03-17: 1 via ORAL
  Filled 2021-03-17: qty 1

## 2021-03-17 MED ORDER — OXYCODONE-ACETAMINOPHEN 5-325 MG PO TABS
1.0000 | ORAL_TABLET | Freq: Four times a day (QID) | ORAL | 0 refills | Status: DC | PRN
Start: 2021-03-17 — End: 2022-03-09

## 2021-03-17 NOTE — ED Triage Notes (Signed)
Pt c/o right shoulder pain that radiates down right arm with tingling in her right fingers. Symptoms started 03/13/21. Pt reports she was seen here on 03/14/21 but hasn't been able to sleep since due to the pain.

## 2021-03-17 NOTE — Discharge Instructions (Addendum)
Call Dr. Ruthe Mannan office to arrange a follow-up appointment for next week if your symptoms are not improving.  Please follow-up with your primary care provider.  I have listed some clinics that you may contact to establish primary care if needed.  Return to emergency department for any new or worsening symptoms.  Stop taking the ibuprofen as this is what is likely causing your stomach upset.  Your blood pressure today was elevated.  It is important that you take your blood pressure medication every day as directed.

## 2021-03-19 NOTE — ED Provider Notes (Signed)
[facility] EMERGENCY DEPARTMENT Provider Note   CSN: [id number] Arrival date & time: 03/17/21  1138     History  Chief Complaint  Patient presents with   Arm Pain    Kristin Simmons is a 35 y.o. female.   Arm Pain Pertinent negatives include no headaches.      Kristin Simmons is a 36 y.o. female who presents to the Emergency Department complaining of persistent right shoulder and upper back pain.  She was seen here on 03/13/21 for same.  She was prescribed NSAID and muscle relaxer which she states are not controlling her pain.  She has intermittent numbness and tingling into her right arm as well.  She denies headaches, chest pain and shortness of breath.  No discoloration or swelling of her arm.  Denies known injury.    Home Medications Prior to Admission medications   Medication Sig Start Date End Date Taking? Authorizing Provider  lidocaine (LIDODERM) 5 % Place 1 patch onto the skin daily. Remove & Discard patch within 12 hours or as directed by MD 03/17/21  Yes , , PA-C  omeprazole (PRILOSEC) 20 MG capsule Take 1 capsule (20 mg total) by mouth daily. 03/17/21  Yes , , PA-C  oxyCODONE-acetaminophen (PERCOCET/ROXICET) 5-325 MG tablet Take 1 tablet by mouth every 6 (six) hours as needed. 03/17/21  Yes , , PA-C  amLODipine (NORVASC) 10 MG tablet Take 1 tablet (10 mg total) by mouth daily. 03/14/21   Sherwood Gambler, MD  methocarbamol (ROBAXIN) 500 MG tablet Take 1 tablet (500 mg total) by mouth every 8 (eight) hours as needed for muscle spasms. 03/14/21   Sherwood Gambler, MD  prenatal vitamin w/FE, FA (PRENATAL 1 + 1) 27-1 MG TABS tablet Take 1 tablet by mouth daily at 12 noon. Patient not taking: Reported on 04/18/2018 12/08/15   Estill Dooms, NP  triamterene-hydrochlorothiazide (MAXZIDE-25) 37.5-25 MG tablet Take 1 tablet by mouth daily. 03/14/21   Sherwood Gambler, MD      Allergies    Latex    Review of Systems   Review of Systems   Constitutional:  Negative for chills and fever.  HENT:  Negative for congestion.   Eyes:  Negative for visual disturbance.  Gastrointestinal:  Negative for nausea and vomiting.  Musculoskeletal:  Positive for arthralgias (right shoulder and right upper back pain). Negative for back pain.  Skin:  Negative for rash.  Neurological:  Negative for dizziness, weakness and headaches.       Intermittent tingling right arm  Psychiatric/Behavioral:  Negative for confusion.   All other systems reviewed and are negative.  Physical Exam Updated Vital Signs BP (!) 139/111    Pulse 80    Temp (!) 97.4 F (36.3 C) (Oral)    Resp 12    Ht 5\' 4"  (1.626 m)    Wt 56.2 kg    LMP  (Within Weeks)    SpO2 99%    BMI 21.28 kg/m  Physical Exam Vitals and nursing note reviewed.  Constitutional:      General: She is not in acute distress.    Appearance: Normal appearance.  HENT:     Head: Atraumatic.  Cardiovascular:     Rate and Rhythm: Normal rate and regular rhythm.     Pulses: Normal pulses.  Pulmonary:     Effort: Pulmonary effort is normal. No respiratory distress.  Musculoskeletal:        General: Normal range of motion.     Cervical back: Normal  range of motion. No tenderness.     Comments: Ttp of the right trapezius and upper rhomboid muscles. No edema pain reproduced with ROM of the right shoulder   Skin:    General: Skin is warm.     Capillary Refill: Capillary refill takes less than 2 seconds.     Findings: No rash.  Neurological:     General: No focal deficit present.     Mental Status: She is alert.     Sensory: Sensation is intact. No sensory deficit.     Motor: Motor function is intact. No weakness.     Coordination: Coordination is intact.    ED Results / Procedures / Treatments   Labs (all labs ordered are listed, but only abnormal results are displayed) Labs Reviewed - No data to display  EKG None  Radiology No results found.  Procedures Procedures    Medications  Ordered in ED Medications  oxyCODONE-acetaminophen (PERCOCET/ROXICET) 5-325 MG per tablet 1 tablet (1 tablet Oral Given 03/17/21 1313)  amLODipine (NORVASC) tablet 10 mg (10 mg Oral Given 03/17/21 1440)    ED Course/ Medical Decision Making/ A&P                           Medical Decision Making Pt here for pain control.  Seen here 03/14/21 for same.  States muscle relaxer and Ibuprofen not helping.    No new injury.  Unable to sleep due to pain.    Amount and/or Complexity of Data Reviewed External Data Reviewed: labs, radiology and notes.    Details: ER notes, imaging and labs from prior visit on 03/14/21 reviewed by me.  CT angio chest and CXR w/o evidence of acute findings.  Risk Prescription drug management.   Pt here for pain control of right shoulder/arm  sx's not controlled with NSAID.  She is non toxic appearing.  Doubt septic joint, sx's possibly radicular.  No reported injury.  No indication for repeat imaging at this time.   She is hypertensive, but admits to not taking her anti hypertensive medications.  Discussed risks involoved in medication non compliance.  Labs from 03/14/21 reviewed.  No evidence of end organ damage.  She verbalized understanding and agrees to take her medications when she returns home.    I will provide short course of narcotic, database reviewed.  She appears appropriate for d/c home.  Discussed return precautions.            Final Clinical Impression(s) / ED Diagnoses Final diagnoses:  Acute right-sided thoracic back pain  Hypertensive urgency    Rx / DC Orders ED Discharge Orders          Ordered    oxyCODONE-acetaminophen (PERCOCET/ROXICET) 5-325 MG tablet  Every 6 hours PRN        03/17/21 1450    lidocaine (LIDODERM) 5 %  Every 24 hours        03/17/21 1450    omeprazole (PRILOSEC) 20 MG capsule  Daily        03/17/21 1451              Kem Parkinson, PA-C 03/20/21 1120    Godfrey Pick, MD 03/20/21 1730

## 2021-04-15 ENCOUNTER — Encounter: Payer: Self-pay | Admitting: Orthopaedic Surgery

## 2021-04-15 ENCOUNTER — Ambulatory Visit (INDEPENDENT_AMBULATORY_CARE_PROVIDER_SITE_OTHER): Payer: Medicaid Other

## 2021-04-15 ENCOUNTER — Other Ambulatory Visit: Payer: Self-pay

## 2021-04-15 ENCOUNTER — Ambulatory Visit (INDEPENDENT_AMBULATORY_CARE_PROVIDER_SITE_OTHER): Payer: Medicaid Other | Admitting: Orthopaedic Surgery

## 2021-04-15 VITALS — Ht 64.0 in | Wt 120.0 lb

## 2021-04-15 DIAGNOSIS — M4722 Other spondylosis with radiculopathy, cervical region: Secondary | ICD-10-CM | POA: Diagnosis not present

## 2021-04-15 DIAGNOSIS — M542 Cervicalgia: Secondary | ICD-10-CM | POA: Diagnosis not present

## 2021-04-15 MED ORDER — PREDNISONE 10 MG (21) PO TBPK: ORAL_TABLET | ORAL | 0 refills | Status: DC

## 2021-04-15 NOTE — Progress Notes (Signed)
? ?Office Visit Note ?  ?Patient: Kristin Simmons           ?Date of Birth: Feb 02, 1986           ?MRN: [id number] ?Visit Date: 04/15/2021 ?             ?Requested by: Royce Macadamia D., PA-C ?[address] ?[address] ?[place],  [place] [address] ?PCP: Raiford Simmonds., PA-C ? ? ?Assessment & Plan: ?Visit Diagnoses:  ?1. Neck pain   ?2. Other spondylosis with radiculopathy, cervical region   ? ? ?Plan: Prednisone Dosepak 10 mg sent into her pharmacy she lives in [place].  Outpatient therapy at [facility] for neck and right arm radicular pain evaluation and treatment.  Recheck 4 weeks if she is having persistent symptoms we will proceed with MRI imaging of her cervical spine. ? ?Follow-Up Instructions: Return in about 4 weeks (around 05/13/2021).  ? ?Orders:  ?Orders Placed This Encounter  ?Procedures  ? XR Cervical Spine 2 or 3 views  ? ?Meds ordered this encounter  ?Medications  ? predniSONE (STERAPRED UNI-PAK 21 TAB) 10 MG (21) TBPK tablet  ?  Sig: TAKE 6,5,4,3,2,1 one tablet less each day with food.  ?  Dispense:  21 tablet  ?  Refill:  0  ? ? ? ? Procedures: ?No procedures performed ? ? ?Clinical Data: ?No additional findings. ? ? ?Subjective: ?Chief Complaint  ?Patient presents with  ? Neck - Pain  ? Right Arm - Pain  ? ? ?HPI 36 year old female here with onset of pain 03/14/2020 after she had slept on her daughter's bed fall asleep on the clock couch.  Patient woke with pain in her neck radiates down to her right arm and her fourth and fifth finger but also some numbness thumb and index finger.  She keeps her elbow bent has pain with neck rotation at times she states she cannot turn to the right.  She had some posterior headaches.  No past history of significant neck injury she is the mother of 5 children, past history of depression, anxiety alcohol use cocaine use, underweight.  Patient denies gait problems.  She states the pain is 8 out of 10. ? ?Review of Systems no myelopathic symptoms although review  of systems noncontributory to HPI. ? ? ?Objective: ?Vital Signs: Ht 5\' 4"  (1.626 m)   Wt 120 lb (54.4 kg)   BMI 20.60 kg/m?  ? ?Physical Exam ?Constitutional:   ?   Appearance: She is well-developed.  ?HENT:  ?   Head: Normocephalic.  ?   Right Ear: External ear normal.  ?   Left Ear: External ear normal. There is no impacted cerumen.  ?Eyes:  ?   Pupils: Pupils are equal, round, and reactive to light.  ?Neck:  ?   Thyroid: No thyromegaly.  ?   Trachea: No tracheal deviation.  ?Cardiovascular:  ?   Rate and Rhythm: Normal rate.  ?Pulmonary:  ?   Effort: Pulmonary effort is normal.  ?Abdominal:  ?   Palpations: Abdomen is soft.  ?Musculoskeletal:  ?   Cervical back: No rigidity.  ?Skin: ?   General: Skin is warm and dry.  ?Neurological:  ?   Mental Status: She is alert and oriented to person, place, and time.  ?Psychiatric:     ?   Behavior: Behavior normal.  ? ? ?Ortho Exam patient has brachial plexus tenderness on the right arm is thin she has pain that radiates to the dorsum  of the wrist with palpation over the radial nerve distal third of the arm.  Lateral condyle is moderately tender.  She has some discoloration over the elbow where she has been applying ice packs to her arm.  Positive Spurling on the right negative on the left no lower extremity clonus normal heel toe gait. ? ?Specialty Comments:  ?No specialty comments available. ? ?Imaging: ?No results found. ? ? ?PMFS History: ?Patient Active Problem List  ? Diagnosis Date Noted  ? Other spondylosis with radiculopathy, cervical region 04/15/2021  ? PROM (premature rupture of membranes) 03/26/2018  ? Postpartum hypertension 03/26/2018  ? Trichomoniasis 12/05/2017  ? High risk social situation  12/05/2017  ? Supervision of normal pregnancy 10/19/2017  ? History of cocaine use 10/19/2017  ? Adjustment disorder with depressed mood 09/13/2017  ? Anxiety disorder, unspecified 09/12/2017  ? Abnormal Pap smear of cervix 01/18/2016  ? Smoker 01/13/2016  ?  Depression 01/13/2016  ? Alcohol use affecting pregnancy, first trimester 01/13/2016  ? History of preterm delivery, currently pregnant 01/13/2016  ? Underweight 01/13/2016  ? ?Past Medical History:  ?Diagnosis Date  ? Anemia   ? Anxiety   ? Cancer [facility])   ? cervical  ? Depression   ? Hypertension   ? Vaginal Pap smear, abnormal   ?  ?Family History  ?Problem Relation Age of Onset  ? Alzheimer's disease Paternal Grandmother   ? Cancer Maternal Grandmother   ? Cancer Maternal Grandfather   ? Hypertension Father   ? Anemia Mother   ? Hypertension Mother   ? Thyroid disease Mother   ? Diabetes Sister   ? Hypertension Sister   ? Mental illness Brother   ? Asthma Daughter   ? Bronchitis Daughter   ? Asthma Daughter   ? Bronchitis Daughter   ? Asthma Daughter   ? Bronchitis Daughter   ?  ?Past Surgical History:  ?Procedure Laterality Date  ? NO PAST SURGERIES    ? TUBAL LIGATION Bilateral 03/27/2018  ? Procedure: POST PARTUM TUBAL LIGATION;  Surgeon: Truett Mainland, DO;  Location: [place];  Service: Gynecology;  Laterality: Bilateral;  ? ?Social History  ? ?Occupational History  ? Not on file  ?Tobacco Use  ? Smoking status: Every Day  ?  Packs/day: 0.25  ?  Years: 15.00  ?  Pack years: 3.75  ?  Types: Cigarettes  ? Smokeless tobacco: Never  ?Vaping Use  ? Vaping Use: Never used  ?Substance and Sexual Activity  ? Alcohol use: Not Currently  ?  Alcohol/week: 14.0 standard drinks  ?  Types: 14 Cans of beer per week  ?  Comment: 2 beers daily  ? Drug use: No  ?  Types: Marijuana  ?  Comment: last use 2007  ? Sexual activity: Yes  ?  Birth control/protection: None  ? ? ? ? ? ? ?

## 2021-04-15 NOTE — Addendum Note (Signed)
Addended by: Meyer Cory on: 04/15/2021 03:30 PM ? ? Modules accepted: Orders ? ?

## 2021-04-22 ENCOUNTER — Other Ambulatory Visit: Payer: Self-pay

## 2021-04-22 ENCOUNTER — Ambulatory Visit (HOSPITAL_COMMUNITY): Payer: Medicaid Other | Attending: Orthopaedic Surgery | Admitting: Physical Therapy

## 2021-04-22 ENCOUNTER — Encounter (HOSPITAL_COMMUNITY): Payer: Self-pay | Admitting: Physical Therapy

## 2021-04-22 DIAGNOSIS — M4722 Other spondylosis with radiculopathy, cervical region: Secondary | ICD-10-CM | POA: Insufficient documentation

## 2021-04-22 DIAGNOSIS — M542 Cervicalgia: Secondary | ICD-10-CM | POA: Diagnosis present

## 2021-04-22 DIAGNOSIS — M5412 Radiculopathy, cervical region: Secondary | ICD-10-CM | POA: Diagnosis present

## 2021-04-22 NOTE — Therapy (Signed)
?OUTPATIENT PHYSICAL THERAPY CERVICAL EVALUATION ? ? ?Patient Name: Kristin Simmons ?MRN: [id number] ?DOB:03-24-1985, 36 y.o., female ?Today's Date: 04/22/2021 ? ? PT End of Session - 04/22/21 1445   ? ? Visit Number 1   ? Number of Visits 8   ? Date for PT Re-Evaluation 05/20/21   ? Authorization Type Medicaid (check auth)   ? Authorization - Visit Number 1   ? Authorization - Number of Visits 1   ? PT Start Time 7078   ? PT Stop Time 1435   ? PT Time Calculation (min) 42 min   ? Activity Tolerance Patient limited by pain   ? Behavior During Therapy [facility] for tasks assessed/performed   ? ?  ?  ? ?  ? ? ?Past Medical History:  ?Diagnosis Date  ? Anemia   ? Anxiety   ? Cancer [facility])   ? cervical  ? Depression   ? Hypertension   ? Vaginal Pap smear, abnormal   ? ?Past Surgical History:  ?Procedure Laterality Date  ? NO PAST SURGERIES    ? TUBAL LIGATION Bilateral 03/27/2018  ? Procedure: POST PARTUM TUBAL LIGATION;  Surgeon: Truett Mainland, DO;  Location: [place];  Service: Gynecology;  Laterality: Bilateral;  ? ?Patient Active Problem List  ? Diagnosis Date Noted  ? Other spondylosis with radiculopathy, cervical region 04/15/2021  ? PROM (premature rupture of membranes) 03/26/2018  ? Postpartum hypertension 03/26/2018  ? Trichomoniasis 12/05/2017  ? High risk social situation  12/05/2017  ? Supervision of normal pregnancy 10/19/2017  ? History of cocaine use 10/19/2017  ? Adjustment disorder with depressed mood 09/13/2017  ? Anxiety disorder, unspecified 09/12/2017  ? Abnormal Pap smear of cervix 01/18/2016  ? Smoker 01/13/2016  ? Depression 01/13/2016  ? Alcohol use affecting pregnancy, first trimester 01/13/2016  ? History of preterm delivery, currently pregnant 01/13/2016  ? Underweight 01/13/2016  ? ? ?PCP: Royce Macadamia D., PA-C ? ?REFERRING PROVIDER: Marybelle Killings, MD ? ?REFERRING DIAG: M54.2 (ICD-10-CM) - Neck pain M47.22 (ICD-10-CM) - Other spondylosis with radiculopathy, cervical region  ? ?THERAPY  DIAG:  ?Cervicalgia ? ?Radiculopathy, cervical region ? ?ONSET DATE: 03/14/21 ? ?SUBJECTIVE:                                                                                                                                                                                                        ? ?SUBJECTIVE STATEMENT: ?Patient presents to therapy with complaint of insidious neck pain beginning 03/14/21. She says she woke up that way. She went to the Doctor  and they sent her to the hospital. She had x-rays and was referred to specialist who referred her here today. She reports no feeling in the lateral 3 fingers of her RT hand. She denies medication for this issue. She has not had any nerve conduction testing.  ? ?PERTINENT HISTORY:  ?NA ? ?PAIN:  ?Are you having pain? Yes ?NPRS scale: 8/10 ?Pain location: RT arm/ axillary  ?Pain orientation: Right  ?PAIN TYPE: sharp and tingling ?Pain description: constant, sharp, and stabbing  ?Aggravating factors: nighttime, laying down, movement  ?Relieving factors: keeping propped up ( in guarded, elbow flexed position) ? ?PRECAUTIONS: None ? ?WEIGHT BEARING RESTRICTIONS No ? ?FALLS:  ?Has patient fallen in last 6 months? No ?Number of falls:  ? ?LIVING ENVIRONMENT: ?Lives with: lives with their family ? ? ?OCCUPATION: Unemployed  ? ?PLOF: Independent ? ?PATIENT GOALS Get function back and start working again  ? ?OBJECTIVE:  ? ?DIAGNOSTIC FINDINGS:  ? Xray from 04/15/21: reversal of mid cervical curvature C5-6 and C6-7 disc base  ?narrowing with 1 mm retrolisthesis C6-7 with posterior spurring.   ? ?COGNITION: ?Overall cognitive status: Within functional limits for tasks assessed ? ? ?POSTURE:  ?Rounded shoulders, slouched seated posture ? ?PALPATION: ?   ?Mod tenderness to palpation about posterior RT deltoid, axillary, RT upper trap  ? ?CERVICAL AROM/PROM ? ?A/PROM A/PROM (deg) ?04/22/2021  ?Flexion 28  ?Extension 6  ?Right lateral flexion   ?Left lateral flexion   ?Right rotation 45   ?Left rotation 60  ? (Blank rows = not tested) ? ?UE AROM/PROM: ? ?Min restriction in RT shoulder flexion  ? ?UE MMT: ? ?MMT Right ?04/22/2021 Left ?04/22/2021  ?Shoulder flexion 4-/5 4+/5  ?Shoulder extension    ?Shoulder abduction    ?Shoulder adduction    ?Shoulder extension 4-/5 4+/5  ?Shoulder internal rotation 3+/5 4+/5  ?Shoulder external rotation    ?Middle trapezius    ?Lower trapezius    ?Elbow flexion    ?Elbow extension    ?Wrist flexion    ?Wrist extension    ?Wrist ulnar deviation    ?Wrist radial deviation    ?Wrist pronation    ?Wrist supination    ?Grip strength    ? (Blank rows = not tested) ? ? ?TODAY'S TREATMENT:  ?04/22/21 ? Overhead tricep stretch 5 x 15"  ?Shoulder IR stretch 2 x 15"  ?Chin tuck x 10 ? ? ?PATIENT EDUCATION:  ?Education details: On Evaluation findings, POC and HEP  ?Person educated: Patient ?Education method: Explanation and Demonstration ?Education comprehension: verbalized understanding and returned demonstration ? ? ?HOME EXERCISE PROGRAM: ?Access Code: [email or web address] ?URL: [email or web address] ?Date: 04/22/2021 ?Prepared by: Josue Hector ? ?Exercises ?Seated Cervical Retraction - 3 x daily - 7 x weekly - 2 sets - 10 reps - 3 second hold ?Standing Overhead Triceps Stretch - 3 x daily - 7 x weekly - 1 sets - 10 reps - 10-15 sec hold ?Standing Shoulder Internal Rotation Stretch with Hands Behind Back - 3 x daily - 7 x weekly - 1 sets - 10 reps - 10-15 sec hold ? ? ?ASSESSMENT: ? ?CLINICAL IMPRESSION: ? ?Patient is a 36 y.o. female who presents to physical therapy with complaint of neck pain. Patient demonstrates decreased strength, ROM restriction, reduced flexibility, increased tenderness to palpation and postural abnormalities which are likely contributing to symptoms of pain and are negatively impacting patient ability to perform ADLs. Patient will benefit from skilled physical therapy services to address these deficits to reduce pain and  improve level of function  with ADLs ? ?OBJECTIVE IMPAIRMENTS decreased ROM, decreased strength, hypomobility, increased fascial restrictions, impaired flexibility, impaired sensation, impaired UE functional use, improper body mechanics, postural dysfunction, and pain.  ? ?ACTIVITY LIMITATIONS cleaning, community activity, driving, occupation, laundry, yard work, and shopping.  ? ?REHAB POTENTIAL: Good ? ?CLINICAL DECISION MAKING: Stable/uncomplicated ? ?EVALUATION COMPLEXITY: Low ? ? ?GOALS: ? ?SHORT TERM GOALS: ? ? ?Patient will be independent with initial HEP and self-management strategies to improve functional outcomes ?Baseline:  ?Target date: 05/06/2021 ?Goal status: INITIAL ? ? ?LONG TERM GOALS: ? ?Patient will be independent with advanced HEP and self-management strategies to improve functional outcomes ?Baseline:  ?Target date: 05/20/2021 ?Goal status: INITIAL ? ?2.  Patient improve RT cervical rotation by 10 degrees in order to improve ability to scan environment for safety and while driving. ?Baseline:  ?Target date: 05/20/2021 ?Goal status: INITIAL ? ?3.  Patient will report at least 70% overall improvement in subjective complaint to indicate improvement in ability to perform ADLs. ?Baseline:  ?Target date: 05/20/2021 ?Goal status: INITIAL ? ?PT FREQUENCY: 2x/week ? ?PT DURATION: 4 weeks ? ?PLANNED INTERVENTIONS: Therapeutic exercises, Therapeutic activity, Neuromuscular re-education, Balance training, Gait training, Patient/Family education, Joint manipulation, Joint mobilization, DME instructions, Dry Needling, Electrical stimulation, Spinal manipulation, Spinal mobilization, Taping, Traction, and Manual therapy ? ?PLAN FOR NEXT SESSION: Assess response to HEP. Progress pain free cervical and RT shoulder mobility as able. STM to RT axillary region for pain and restriction.  ? ?2:47 PM, 04/22/21 ?Josue Hector PT DPT  ?Physical Therapist with [place]  ?[facility]  ?[phone] ? ? ? ?  ?

## 2021-04-28 ENCOUNTER — Ambulatory Visit (HOSPITAL_COMMUNITY): Payer: Medicaid Other | Admitting: Physical Therapy

## 2021-04-28 ENCOUNTER — Telehealth (HOSPITAL_COMMUNITY): Payer: Self-pay | Admitting: Physical Therapy

## 2021-04-28 NOTE — Telephone Encounter (Signed)
First no Show:   ?Called Patient and left her a message of when her next appointment was. ? ?[name], PT CLT ?[phone]  ? ? ?

## 2021-04-30 ENCOUNTER — Telehealth (HOSPITAL_COMMUNITY): Payer: Self-pay | Admitting: Physical Therapy

## 2021-04-30 ENCOUNTER — Ambulatory Visit (HOSPITAL_COMMUNITY): Payer: Medicaid Other | Admitting: Physical Therapy

## 2021-04-30 NOTE — Telephone Encounter (Signed)
2nd no show: ? ?Called pt to remind her of her next appointment.  Which is at 1:15 pm on Wed 3/22.  All other appointments have been cancelled and pt will have to make one appointment at a time.  ? ?[name], PT CLT ?[phone]  ?

## 2021-05-05 ENCOUNTER — Encounter (HOSPITAL_COMMUNITY): Payer: Medicaid Other | Admitting: Physical Therapy

## 2021-05-05 ENCOUNTER — Telehealth (HOSPITAL_COMMUNITY): Payer: Self-pay | Admitting: Physical Therapy

## 2021-05-05 NOTE — Telephone Encounter (Signed)
Pt did not show for 3rd consecutive scheduled PT appointment.  Called and spoke with pateint regarding NS policy and discharge from therapy at this time.  Educated she would have to obtain a new PT order in order to return. ? ?Teena Irani, PTA/CLT, WTA ?[phone] ? ?

## 2021-05-07 ENCOUNTER — Encounter (HOSPITAL_COMMUNITY): Payer: Medicaid Other | Admitting: Physical Therapy

## 2021-05-11 ENCOUNTER — Encounter (HOSPITAL_COMMUNITY): Payer: Medicaid Other | Admitting: Physical Therapy

## 2021-05-13 ENCOUNTER — Ambulatory Visit: Payer: Medicaid Other | Admitting: Orthopaedic Surgery

## 2021-05-14 ENCOUNTER — Encounter (HOSPITAL_COMMUNITY): Payer: Medicaid Other | Admitting: Physical Therapy

## 2021-05-18 ENCOUNTER — Encounter (HOSPITAL_COMMUNITY): Payer: Medicaid Other | Admitting: Physical Therapy

## 2021-05-20 ENCOUNTER — Encounter (HOSPITAL_COMMUNITY): Payer: Medicaid Other | Admitting: Physical Therapy

## 2022-01-20 ENCOUNTER — Encounter (HOSPITAL_COMMUNITY): Payer: Self-pay | Admitting: Physical Therapy

## 2022-01-20 NOTE — Therapy (Signed)
[place], [place], [address] Phone: [phone]   Fax:  [phone]  Patient Details  Name: Kristin Simmons MRN: [id number] Date of Birth: 12-26-1985 Referring Provider:  No ref. provider found  Encounter Date: 01/20/2022  PHYSICAL THERAPY DISCHARGE SUMMARY  Visits from Start of Care: 1  Current functional level related to goals / functional outcomes: NA   Remaining deficits: NA   Education / Equipment: Patient DC per non return    Patient agrees to discharge. Patient goals were not met. Patient is being discharged due to not returning since the last visit.   Elizbeth Squires, PT 01/20/2022, 11:32 AM  [facility] [address] [place], [place], [address] Phone: [phone]   Fax:  [phone]

## 2022-01-31 ENCOUNTER — Emergency Department (HOSPITAL_COMMUNITY): Payer: Medicaid Other

## 2022-01-31 ENCOUNTER — Emergency Department (HOSPITAL_COMMUNITY)
Admission: EM | Admit: 2022-01-31 | Discharge: 2022-01-31 | Disposition: A | Discharge status: Home / Self Care | Payer: Medicaid Other | Attending: Emergency Medicine | Admitting: Emergency Medicine

## 2022-01-31 ENCOUNTER — Encounter (HOSPITAL_COMMUNITY): Payer: Self-pay

## 2022-01-31 DIAGNOSIS — R1110 Vomiting, unspecified: Secondary | ICD-10-CM | POA: Diagnosis not present

## 2022-01-31 DIAGNOSIS — Z859 Personal history of malignant neoplasm, unspecified: Secondary | ICD-10-CM | POA: Insufficient documentation

## 2022-01-31 DIAGNOSIS — K0889 Other specified disorders of teeth and supporting structures: Secondary | ICD-10-CM | POA: Diagnosis present

## 2022-01-31 DIAGNOSIS — I720 Aneurysm of carotid artery: Secondary | ICD-10-CM | POA: Insufficient documentation

## 2022-01-31 DIAGNOSIS — Z91040 Latex allergy status: Secondary | ICD-10-CM | POA: Insufficient documentation

## 2022-01-31 DIAGNOSIS — A691 Other Vincent's infections: Secondary | ICD-10-CM

## 2022-01-31 DIAGNOSIS — K0501 Acute gingivitis, non-plaque induced: Secondary | ICD-10-CM | POA: Diagnosis not present

## 2022-01-31 LAB — BASIC METABOLIC PANEL
Anion gap: 9 (ref 5–15)
BUN: 7 mg/dL (ref 6–20)
CO2: 28 mmol/L (ref 22–32)
Calcium: 9 mg/dL (ref 8.9–10.3)
Chloride: 97 mmol/L — ABNORMAL LOW (ref 98–111)
Creatinine, Ser: 0.68 mg/dL (ref 0.44–1.00)
GFR, Estimated: >60 mL/min (ref >60)
Glucose, Bld: 111 mg/dL — ABNORMAL HIGH (ref 70–99)
Potassium: 3.9 mmol/L (ref 3.5–5.1)
Sodium: 134 mmol/L — ABNORMAL LOW (ref 135–145)

## 2022-01-31 LAB — CBC WITH DIFFERENTIAL/PLATELET
Abs Immature Granulocytes: 0.04 10*3/uL (ref 0.00–0.07)
Basophils Absolute: 0 10*3/uL (ref 0.0–0.1)
Basophils Relative: 0 %
Eosinophils Absolute: 0 10*3/uL (ref 0.0–0.5)
Eosinophils Relative: 0 %
HCT: 37.7 % (ref 36.0–46.0)
Hemoglobin: 13.4 g/dL (ref 12.0–15.0)
Immature Granulocytes: 0 %
Lymphocytes Relative: 11 %
Lymphs Abs: 1 10*3/uL (ref 0.7–4.0)
MCH: 33.6 pg (ref 26.0–34.0)
MCHC: 35.5 g/dL (ref 30.0–36.0)
MCV: 94.5 fL (ref 80.0–100.0)
Monocytes Absolute: 0.5 10*3/uL (ref 0.1–1.0)
Monocytes Relative: 5 %
Neutro Abs: 7.6 10*3/uL (ref 1.7–7.7)
Neutrophils Relative %: 84 %
Platelets: 227 10*3/uL (ref 150–400)
RBC: 3.99 MIL/uL (ref 3.87–5.11)
RDW: 14.7 % (ref 11.5–15.5)
WBC: 9.2 10*3/uL (ref 4.0–10.5)
nRBC: 0 % (ref 0.0–0.2)

## 2022-01-31 LAB — I-STAT CHEM 8, ED
BUN: 5 mg/dL — ABNORMAL LOW (ref 6–20)
Calcium, Ion: 1.12 mmol/L — ABNORMAL LOW (ref 1.15–1.40)
Chloride: 93 mmol/L — ABNORMAL LOW (ref 98–111)
Creatinine, Ser: 0.7 mg/dL (ref 0.44–1.00)
Glucose, Bld: 104 mg/dL — ABNORMAL HIGH (ref 70–99)
HCT: 47 % — ABNORMAL HIGH (ref 36.0–46.0)
Hemoglobin: 16 g/dL — ABNORMAL HIGH (ref 12.0–15.0)
Potassium: 4.1 mmol/L (ref 3.5–5.1)
Sodium: 134 mmol/L — ABNORMAL LOW (ref 135–145)
TCO2: 29 mmol/L (ref 22–32)

## 2022-01-31 LAB — I-STAT BETA HCG BLOOD, ED (MC, WL, AP ONLY): I-stat hCG, quantitative: <5 m[IU]/mL (ref <5)

## 2022-01-31 MED ORDER — IBUPROFEN 600 MG PO TABS: 600.0000 mg | ORAL_TABLET | Freq: Four times a day (QID) | ORAL | 0 refills | Status: DC | PRN

## 2022-01-31 MED ORDER — CHLORHEXIDINE GLUCONATE 0.12% ORAL RINSE (MEDLINE KIT): 15.0000 mL | Freq: Two times a day (BID) | OROMUCOSAL | 0 refills | Status: AC

## 2022-01-31 MED ORDER — ONDANSETRON HCL 4 MG PO TABS: 4.0000 mg | ORAL_TABLET | Freq: Four times a day (QID) | ORAL | 0 refills | Status: DC

## 2022-01-31 MED ORDER — AMOXICILLIN-POT CLAVULANATE 875-125 MG PO TABS: 1.0000 | ORAL_TABLET | Freq: Two times a day (BID) | ORAL | 0 refills | Status: DC

## 2022-01-31 MED ORDER — ONDANSETRON 4 MG PO TBDP
4.0000 mg | ORAL_TABLET | Freq: Once | ORAL | Status: AC
  Administered 2022-01-31: 4 mg via ORAL
  Filled 2022-01-31: qty 1

## 2022-01-31 MED ORDER — METRONIDAZOLE 500 MG PO TABS: 500.0000 mg | ORAL_TABLET | Freq: Two times a day (BID) | ORAL | 0 refills | Status: DC

## 2022-01-31 MED ORDER — HYDROCODONE-ACETAMINOPHEN 5-325 MG PO TABS
1.0000 | ORAL_TABLET | Freq: Once | ORAL | Status: AC
  Administered 2022-01-31: 1 via ORAL
  Filled 2022-01-31: qty 1

## 2022-01-31 MED ORDER — IBUPROFEN 400 MG PO TABS
600.0000 mg | ORAL_TABLET | Freq: Once | ORAL | Status: AC
  Administered 2022-01-31: 600 mg via ORAL
  Filled 2022-01-31: qty 2

## 2022-01-31 MED ORDER — AMOXICILLIN-POT CLAVULANATE 875-125 MG PO TABS
1.0000 | ORAL_TABLET | Freq: Once | ORAL | Status: AC
  Administered 2022-01-31: 1 via ORAL
  Filled 2022-01-31: qty 1

## 2022-01-31 MED ORDER — MAGIC MOUTHWASH W/LIDOCAINE
15.0000 mL | Freq: Once | ORAL | Status: AC
  Administered 2022-01-31: 5 mL via ORAL
  Filled 2022-01-31: qty 15

## 2022-01-31 MED ORDER — METRONIDAZOLE 500 MG PO TABS
500.0000 mg | ORAL_TABLET | Freq: Once | ORAL | Status: AC
  Administered 2022-01-31: 500 mg via ORAL
  Filled 2022-01-31: qty 1

## 2022-01-31 MED ORDER — IOHEXOL 300 MG/ML  SOLN
75.0000 mL | Freq: Once | INTRAMUSCULAR | Status: AC | PRN
  Administered 2022-01-31: 75 mL via INTRAVENOUS

## 2022-01-31 NOTE — Discharge Instructions (Addendum)
At the visit today was overall consistent with acute necrotizing ulcerative gingivitis.  See information packet for details/instructions.  Will treat this with antibiotics over the next 7 days as well as with oral rinses twice daily and ibuprofen for pain management.  Take Zofran as needed for nausea.  I have attached information for ENT/dentistry for follow-up.  Please call at your earliest convenience to set up an appointment.  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  Please see the vascular specialist number to call to set up an appointment.  Please do not hesitate to return to emergency department if the worrisome signs and symptoms we discussed become apparent.

## 2022-01-31 NOTE — ED Triage Notes (Signed)
Pt c/o generalized dental pain since yesterday. Pt also endorses emesis earlier this morning.

## 2022-01-31 NOTE — ED Provider Notes (Signed)
[facility] EMERGENCY DEPARTMENT Provider Note   CSN: [id number] Arrival date & time: 01/31/22  1216     History  Chief Complaint  Patient presents with   Dental Pain   Emesis    Kristin Simmons is a 36 y.o. female.   Dental Pain Emesis   36 year old female presents emergency department with complaints of generalized "come pain.  Patient reports symptom onset yesterday night of diffuse gum pain with subsequent bleeding.  Denies history of similar symptoms in the past.  She reports emesis upon awakening earlier this morning but denies any current feelings of nausea.  Denies blood thinner use.  Denies any acute localized dental pain.  Has noticed swelling on the inside part of her right upper gum as of this morning.  Denies any known drainage from affected site.  Denies fever, difficulty breathing/throat swelling/closing on her.  Past medical history significant for hypertension, anemia, malignancy  Home Medications Prior to Admission medications   Medication Sig Start Date End Date Taking? Authorizing Provider  amoxicillin-clavulanate (AUGMENTIN) 875-125 MG tablet Take 1 tablet by mouth every 12 (twelve) hours. 01/31/22  Yes Dion Saucier A, PA  chlorhexidine gluconate, MEDLINE KIT, (PERIDEX) 0.12 % solution Use as directed 15 mLs in the mouth or throat 2 (two) times daily for 7 days. 01/31/22 02/07/22 Yes Dion Saucier A, PA  ibuprofen (ADVIL) 600 MG tablet Take 1 tablet (600 mg total) by mouth every 6 (six) hours as needed. 01/31/22  Yes Dion Saucier A, PA  metroNIDAZOLE (FLAGYL) 500 MG tablet Take 1 tablet (500 mg total) by mouth 2 (two) times daily. 01/31/22  Yes Dion Saucier A, PA  ondansetron (ZOFRAN) 4 MG tablet Take 1 tablet (4 mg total) by mouth every 6 (six) hours. 01/31/22  Yes Dion Saucier A, PA  amLODipine (NORVASC) 10 MG tablet Take 1 tablet (10 mg total) by mouth daily. 03/14/21   Sherwood Gambler, MD  lidocaine (LIDODERM) 5 % Place 1 patch onto the skin  daily. Remove & Discard patch within 12 hours or as directed by MD 03/17/21   Triplett, Tammy, PA-C  methocarbamol (ROBAXIN) 500 MG tablet Take 1 tablet (500 mg total) by mouth every 8 (eight) hours as needed for muscle spasms. Patient not taking: Reported on 04/15/2021 03/14/21   Sherwood Gambler, MD  omeprazole (PRILOSEC) 20 MG capsule Take 1 capsule (20 mg total) by mouth daily. 03/17/21   Triplett, Tammy, PA-C  oxyCODONE-acetaminophen (PERCOCET/ROXICET) 5-325 MG tablet Take 1 tablet by mouth every 6 (six) hours as needed. Patient not taking: Reported on 04/15/2021 03/17/21   Triplett, Lynelle Smoke, PA-C  predniSONE (STERAPRED UNI-PAK 21 TAB) 10 MG (21) TBPK tablet TAKE 6,5,4,3,2,1 one tablet less each day with food. 04/15/21   Marybelle Killings, MD  prenatal vitamin w/FE, FA (PRENATAL 1 + 1) 27-1 MG TABS tablet Take 1 tablet by mouth daily at 12 noon. Patient not taking: Reported on 04/18/2018 12/08/15   Estill Dooms, NP  triamterene-hydrochlorothiazide (MAXZIDE-25) 37.5-25 MG tablet Take 1 tablet by mouth daily. 03/14/21   Sherwood Gambler, MD      Allergies    Latex    Review of Systems   Review of Systems  Gastrointestinal:  Positive for vomiting.  All other systems reviewed and are negative.   Physical Exam Updated Vital Signs BP (!) 174/115 (BP Location: Right Arm)   Pulse 90   Temp 98.2 F (36.8 C)   Resp 18   Ht _0  (1.626 m)   Wt 49.9  kg   LMP 01/25/2022 (Exact Date)   SpO2 99%   BMI 18.88 kg/m  Physical Exam Vitals and nursing note reviewed.  Constitutional:      General: She is not in acute distress.    Appearance: She is well-developed.  HENT:     Head: Normocephalic and atraumatic.     Mouth/Throat:      Comments: Diffusely tender gingiva and dark appearing.  With caries noted at root teeth diffusely.  No active bleeding noted.  Mass noted on palate as indicated above.  Area tender to the touch with no obvious drainage; firm to the touch as well.  Even bilaterally symmetrical  from the chin.  Tonsils are 1+ bilaterally.  No obvious sublingual or submandibular swelling noted. Eyes:     Conjunctiva/sclera: Conjunctivae normal.  Cardiovascular:     Rate and Rhythm: Normal rate and regular rhythm.     Heart sounds: No murmur heard. Pulmonary:     Effort: Pulmonary effort is normal. No respiratory distress.     Breath sounds: Normal breath sounds.  Abdominal:     Palpations: Abdomen is soft.     Tenderness: There is no abdominal tenderness.  Musculoskeletal:        General: No swelling.     Cervical back: Neck supple.  Skin:    General: Skin is warm and dry.     Capillary Refill: Capillary refill takes less than 2 seconds.  Neurological:     Mental Status: She is alert.  Psychiatric:        Mood and Affect: Mood normal.     ED Results / Procedures / Treatments   Labs (all labs ordered are listed, but only abnormal results are displayed) Labs Reviewed  BASIC METABOLIC PANEL - Abnormal; Notable for the following components:      Result Value   Sodium 134 (*)    Chloride 97 (*)    Glucose, Bld 111 (*)    All other components within normal limits  I-STAT CHEM 8, ED - Abnormal; Notable for the following components:   Sodium 134 (*)    Chloride 93 (*)    BUN 5 (*)    Glucose, Bld 104 (*)    Calcium, Ion 1.12 (*)    Hemoglobin 16.0 (*)    HCT 47.0 (*)    All other components within normal limits  CBC WITH DIFFERENTIAL/PLATELET  I-STAT BETA HCG BLOOD, ED ([facility], [facility], [facility] ONLY)    EKG None  Radiology CT Maxillofacial W Contrast  Result Date: 01/31/2022 CLINICAL DATA:  Pain and swelling.  Dental pain since yesterday. EXAM: CT MAXILLOFACIAL WITH CONTRAST TECHNIQUE: Multidetector CT imaging of the maxillofacial structures was performed with intravenous contrast. Multiplanar CT image reconstructions were also generated. RADIATION DOSE REDUCTION: This exam was performed according to the departmental dose-optimization program which includes automated exposure  control, adjustment of the mA and/or kV according to patient size and/or use of iterative reconstruction technique. CONTRAST:  53m OMNIPAQUE IOHEXOL 300 MG/ML  SOLN COMPARISON:  None Available. FINDINGS: Osseous: Bones of the mid face appear normal. Temporomandibular joints are normal. No CT evidence of dental decay a or periodontal disease, with exception of the following. 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 Orbits: Normal Sinuses: No significant sinus finding. Mild mucosal thickening of the right maxillary sinus floor. Soft tissues: Question mild soft tissue swelling around the lips. No evidence of soft tissue abscess. 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 This is a very concerning lesion and neurovascular consultation in the near term is recommended. IMPRESSION: 1. Question mild soft tissue swelling around the lips. No evidence of soft tissue abscess. 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 This could be the source of the regional inflammation. 2. 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 This is a very concerning lesion and neurovascular consultation in the near term is recommended. Electronically Signed   By: Nelson Chimes M.D.   On: 01/31/2022 16:24    Procedures Procedures    Medications Ordered in ED Medications  ondansetron (ZOFRAN-ODT) disintegrating tablet 4 mg (4 mg Oral Given 01/31/22 1339)  ibuprofen (ADVIL) tablet 600 mg (600 mg Oral Given 01/31/22 1338)  magic mouthwash w/lidocaine (5 mLs Oral Given 01/31/22 1445)  iohexol (OMNIPAQUE) 300 MG/ML solution 75 mL (75 mLs Intravenous Contrast Given 01/31/22 1608)  amoxicillin-clavulanate (AUGMENTIN) 875-125 MG per tablet 1 tablet (1 tablet Oral Given 01/31/22 1851)  metroNIDAZOLE (FLAGYL) tablet 500 mg (500 mg Oral Given 01/31/22 1851)  HYDROcodone-acetaminophen (NORCO/VICODIN) 5-325 MG per  tablet 1 tablet (1 tablet Oral Given 01/31/22 1851)    ED Course/ Medical Decision Making/ A&P Clinical Course as of 01/31/22 1854  Mon Jan 31, 2022  1843 Sodium(!): 134 [CR]    Clinical Course User Index [CR] Wilnette Kales, PA                           Medical Decision Making Amount and/or Complexity of Data Reviewed Labs: ordered. Radiology: ordered.  Risk Prescription drug management.   This patient presents to the ED for concern of mouth pain, this involves an extensive number of treatment options, and is a complaint that carries with it a high risk of complications and morbidity.  The differential diagnosis includes necrotizing ulcerative gingivitis, Ludwig angina, Lemierre's disease, periapical abscess, peritonsillar abscess, anaphylaxis   Co morbidities that complicate the patient evaluation  See HPI   Additional history obtained:  Additional history obtained from EMR External records from outside source obtained and reviewed including hospital records   Lab Tests:  I Ordered, and personally interpreted labs.  The pertinent results include: No leukocytosis noted.  No evidence of anemia.  Platelets within normal range.  Mild hyponatremia with sodium of 134 and mild decrease in chloride of 97; patient tolerating p.o. and elected for outpatient oral rehydration.  Renal function within normal limits.  Beta-hCG negative   Imaging Studies ordered:  I ordered imaging studies including CT maxillofacial I independently visualized and interpreted imaging which showed mild soft tissue swelling around lips.  No evidence of soft tissue abscess.  Illness position of right maxillary tooth 6 with mild lucency around root.  2.5 mm aneurysm arising from right carotid terminus. I agree with the radiologist interpretation   Cardiac Monitoring: / EKG:  The patient was maintained on a cardiac monitor.  I personally viewed and interpreted the cardiac monitored which showed an  underlying rhythm of: Sinus rhythm   Consultations Obtained:  I requested consultation with attending physician Dr. Pearline Cables who evaluated the patient independently and was agreement with treatment plan going forward   Problem List / ED Course / Critical interventions / Medication management  Necrotizing ulcerative gingivitis/carotid artery aneurysm I ordered medication including Zofran for nausea, Advil for pain   Reevaluation of the patient after these medicines showed that the patient improved I have  reviewed the patients home medicines and have made adjustments as needed   Social Determinants of Health:  Poor dental care.  Chronic cigarette use.  Denies illicit drug use   Test / Admission - Considered:  Necrotizing ulcerative gingivitis/carotid artery aneurysm Vitals signs significant for hypertension with blood pressure 174/115; patient with seemingly chronically elevated blood pressure from prior ER visits. Otherwise within normal range and stable throughout visit. Laboratory/imaging studies significant for: See above Patient symptoms mostly concerning for necrotizing ulcerative gingivitis.  Will treat with antibiotic therapy outpatient, chlorhexidine mouthwash, ibuprofen for pain..  No evidence of Ludwig angina, Lemierre's disease.  An incidental finding of carotid artery aneurysm as indicated in scan above; patient educated regarding findings today as well as need for close vascular follow-up outpatient.  Patient also recommended to follow-up with ENT as well as dentistry outpatient for reevaluation of current diagnosis as well as addressing affected teeth.  Patient acknowledged understanding and was agreeable with treatment plan going forward. Worrisome signs and symptoms were discussed with the patient, and the patient acknowledged understanding to return to the ED if noticed. Patient was stable upon discharge.          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

## 2022-02-01 ENCOUNTER — Telehealth: Payer: Self-pay

## 2022-02-01 NOTE — Telephone Encounter (Signed)
[facility] called and advised that patient was approved to come into the office - December 27th at 9:45 with Dr. Kathyrn Sheriff. They will reach out to schedule the patient/confirm appointment

## 2022-02-09 DIAGNOSIS — I671 Cerebral aneurysm, nonruptured: Secondary | ICD-10-CM | POA: Diagnosis not present

## 2022-02-16 ENCOUNTER — Other Ambulatory Visit (HOSPITAL_COMMUNITY): Payer: Self-pay | Admitting: Neurosurgery

## 2022-02-16 DIAGNOSIS — I671 Cerebral aneurysm, nonruptured: Secondary | ICD-10-CM

## 2022-02-24 ENCOUNTER — Other Ambulatory Visit: Payer: Self-pay | Admitting: Neurosurgery

## 2022-03-02 NOTE — Progress Notes (Signed)
Surgical Instructions    Your procedure is scheduled on Tuesday, 03/08/22.  Report to [facility] Main Entrance "A" at 8:00 A.M., then check in with the Admitting office.  Call this number if you have problems the morning of surgery:  [phone]   If you have any questions prior to your surgery date call [phone]: Open Monday-Friday 8am-4pm If you experience any cold or flu symptoms such as cough, fever, chills, shortness of breath, etc. between now and your scheduled surgery, please notify us at the above number     Remember:  Do not eat after midnight the night before your surgery  You may drink clear liquids until 7:00am the morning of your surgery.   Clear liquids allowed are: Water, Non-Citrus Juices (without pulp), Carbonated Beverages, Clear Tea, Black Coffee ONLY (NO MILK, CREAM OR POWDERED CREAMER of any kind), and Gatorade    Take these medicines the morning of surgery with A SIP OF WATER: NONE  As of today, STOP taking any Aspirin (unless otherwise instructed by your surgeon) Aleve, Naproxen, Ibuprofen, Motrin, Advil, Goody's, BC's, all herbal medications, fish oil, and all vitamins.           Do not wear jewelry or makeup. Do not wear lotions, powders, perfumes or deodorant. Do not shave 48 hours prior to surgery.   Do not bring valuables to the hospital. Do not wear nail polish, gel polish, artificial nails, or any other type of covering on natural nails (fingers and toes) If you have artificial nails or gel coating that need to be removed by a nail salon, please have this removed prior to surgery. Artificial nails or gel coating may interfere with anesthesia's ability to adequately monitor your vital signs.  [facility] is not responsible for any belongings or valuables.    Do NOT Smoke (Tobacco/Vaping)  24 hours prior to your procedure  If you use a CPAP at night, you may bring your mask for your overnight stay.   Contacts, glasses, hearing aids, dentures or  partials may not be worn into surgery, please bring cases for these belongings   For patients admitted to the hospital, discharge time will be determined by your treatment team.   Patients discharged the day of surgery will not be allowed to drive home, and someone needs to stay with them for 24 hours.   SURGICAL WAITING ROOM VISITATION Patients having surgery or a procedure may have no more than 2 support people in the waiting area - these visitors may rotate.   Children under the age of 37 must have an adult with them who is not the patient. If the patient needs to stay at the hospital during part of their recovery, the visitor guidelines for inpatient rooms apply. Pre-op nurse will coordinate an appropriate time for 1 support person to accompany patient in pre-op.  This support person may not rotate.   Please refer to [email or web address] for the visitor guidelines for Inpatients (after your surgery is over and you are in a regular room).    Special instructions:    Oral Hygiene is also important to reduce your risk of infection.  Remember - BRUSH YOUR TEETH THE MORNING OF SURGERY WITH YOUR REGULAR TOOTHPASTE   [place]- Preparing For Surgery  Before surgery, you can play an important role. Because skin is not sterile, your skin needs to be as free of germs as possible. You can reduce the number of germs on your skin by washing with CHG (chlorahexidine gluconate) Soap  before surgery.  CHG is an antiseptic cleaner which kills germs and bonds with the skin to continue killing germs even after washing.     Please do not use if you have an allergy to CHG or antibacterial soaps. If your skin becomes reddened/irritated stop using the CHG.  Do not shave (including legs and underarms) for at least 48 hours prior to first CHG shower. It is OK to shave your face.  Please follow these instructions carefully.     Shower the NIGHT BEFORE  SURGERY and the MORNING OF SURGERY with CHG Soap.   If you chose to wash your hair, wash your hair first as usual with your normal shampoo. After you shampoo, rinse your hair and body thoroughly to remove the shampoo.  Then [employer] and genitals (private parts) with your normal soap and rinse thoroughly to remove soap.  After that Use CHG Soap as you would any other liquid soap. You can apply CHG directly to the skin and wash gently with a scrungie or a clean washcloth.   Apply the CHG Soap to your body ONLY FROM THE NECK DOWN.  Do not use on open wounds or open sores. Avoid contact with your eyes, ears, mouth and genitals (private parts). Wash Face and genitals (private parts)  with your normal soap.   Wash thoroughly, paying special attention to the area where your surgery will be performed.  Thoroughly rinse your body with warm water from the neck down.  DO NOT shower/wash with your normal soap after using and rinsing off the CHG Soap.  Pat yourself dry with a CLEAN TOWEL.  Wear CLEAN PAJAMAS to bed the night before surgery  Place CLEAN SHEETS on your bed the night before your surgery  DO NOT SLEEP WITH PETS.   Day of Surgery: Take a shower with CHG soap. Wear Clean/Comfortable clothing the morning of surgery Do not apply any deodorants/lotions.   Remember to brush your teeth WITH YOUR REGULAR TOOTHPASTE.    If you received a COVID test during your pre-op visit, it is requested that you wear a mask when out in public, stay away from anyone that may not be feeling well, and notify your surgeon if you develop symptoms. If you have been in contact with anyone that has tested positive in the last 10 days, please notify your surgeon.    Please read over the following fact sheets that you were given.

## 2022-03-03 ENCOUNTER — Encounter (HOSPITAL_COMMUNITY)
Admission: RE | Admit: 2022-03-03 | Discharge: 2022-03-03 | Disposition: A | Discharge status: Home / Self Care | Payer: 59 | Source: Ambulatory Visit | Attending: Neurosurgery | Admitting: Neurosurgery

## 2022-03-03 ENCOUNTER — Encounter (HOSPITAL_COMMUNITY): Payer: Self-pay

## 2022-03-03 ENCOUNTER — Other Ambulatory Visit: Payer: Self-pay

## 2022-03-03 VITALS — BP 140/87 | HR 98 | Temp 97.9°F | Resp 16 | Ht 64.0 in | Wt 110.5 lb

## 2022-03-03 DIAGNOSIS — R69 Illness, unspecified: Secondary | ICD-10-CM | POA: Diagnosis not present

## 2022-03-03 DIAGNOSIS — Z8541 Personal history of malignant neoplasm of cervix uteri: Secondary | ICD-10-CM | POA: Insufficient documentation

## 2022-03-03 DIAGNOSIS — I671 Cerebral aneurysm, nonruptured: Secondary | ICD-10-CM | POA: Insufficient documentation

## 2022-03-03 DIAGNOSIS — Z01812 Encounter for preprocedural laboratory examination: Secondary | ICD-10-CM | POA: Diagnosis not present

## 2022-03-03 DIAGNOSIS — I10 Essential (primary) hypertension: Secondary | ICD-10-CM | POA: Insufficient documentation

## 2022-03-03 DIAGNOSIS — D649 Anemia, unspecified: Secondary | ICD-10-CM | POA: Insufficient documentation

## 2022-03-03 DIAGNOSIS — F17200 Nicotine dependence, unspecified, uncomplicated: Secondary | ICD-10-CM | POA: Insufficient documentation

## 2022-03-03 DIAGNOSIS — Z01818 Encounter for other preprocedural examination: Secondary | ICD-10-CM

## 2022-03-03 DIAGNOSIS — F1491 Cocaine use, unspecified, in remission: Secondary | ICD-10-CM | POA: Insufficient documentation

## 2022-03-03 LAB — COMPREHENSIVE METABOLIC PANEL
ALT: 31 U/L (ref 0–44)
AST: 49 U/L — ABNORMAL HIGH (ref 15–41)
Albumin: 4.2 g/dL (ref 3.5–5.0)
Alkaline Phosphatase: 55 U/L (ref 38–126)
Anion gap: 12 (ref 5–15)
BUN: 5 mg/dL — ABNORMAL LOW (ref 6–20)
CO2: 25 mmol/L (ref 22–32)
Calcium: 9.4 mg/dL (ref 8.9–10.3)
Chloride: 98 mmol/L (ref 98–111)
Creatinine, Ser: 0.86 mg/dL (ref 0.44–1.00)
GFR, Estimated: >60 mL/min (ref >60)
Glucose, Bld: 92 mg/dL (ref 70–99)
Potassium: 3.8 mmol/L (ref 3.5–5.1)
Sodium: 135 mmol/L (ref 135–145)
Total Bilirubin: 1.2 mg/dL (ref 0.3–1.2)
Total Protein: 8.1 g/dL (ref 6.5–8.1)

## 2022-03-03 LAB — CBC
HCT: 38.3 % (ref 36.0–46.0)
Hemoglobin: 13.8 g/dL (ref 12.0–15.0)
MCH: 34.3 pg — ABNORMAL HIGH (ref 26.0–34.0)
MCHC: 36 g/dL (ref 30.0–36.0)
MCV: 95.3 fL (ref 80.0–100.0)
Platelets: 183 10*3/uL (ref 150–400)
RBC: 4.02 MIL/uL (ref 3.87–5.11)
RDW: 14.4 % (ref 11.5–15.5)
WBC: 5.6 10*3/uL (ref 4.0–10.5)
nRBC: 0 % (ref 0.0–0.2)

## 2022-03-03 LAB — TYPE AND SCREEN
ABO/RH(D): O POS
Antibody Screen: NEGATIVE

## 2022-03-03 NOTE — Progress Notes (Signed)
PCP - Royce Macadamia PA-C Cardiologist - Denies  PPM/ICD - Denies  Chest x-ray - N/A EKG - 03/14/21 Stress Test - Denies ECHO - Denies Cardiac Cath - Denies  Sleep Study - Denies  Diabetes: Denies  Blood Thinner Instructions: N/A Aspirin Instructions: N/A  ERAS Protcol - Yes PRE-SURGERY Ensure or G2- No  COVID TEST- N/A   Anesthesia review: Yes, previous EKG abnormal  Patient denies shortness of breath, fever, cough and chest pain at PAT appointment   All instructions explained to the patient, with a verbal understanding of the material. Patient agrees to go over the instructions while at home for a better understanding. Patient also instructed to self quarantine after being tested for COVID-19. The opportunity to ask questions was provided.

## 2022-03-04 NOTE — Anesthesia Preprocedure Evaluation (Addendum)
Anesthesia Evaluation  Patient identified by MRN, date of birth, ID band Patient awake    Reviewed: Allergy & Precautions, NPO status , [name] Chart, lab work & pertinent test results  History of Anesthesia Complications Negative for: history of anesthetic complications  Airway Mallampati: II  TM Distance: >3 FB Neck ROM: Full    Dental  (+) Dental Advisory Given, Teeth Intact   Pulmonary Current Smoker and Patient abstained from smoking.   Pulmonary exam normal        Cardiovascular hypertension, Pt. on medications Normal cardiovascular exam     Neuro/Psych  PSYCHIATRIC DISORDERS Anxiety Depression    negative neurological ROS     GI/Hepatic negative GI ROS,,,(+)     substance abuse  alcohol use and cocaine use  Endo/Other  negative endocrine ROS    Renal/GU negative Renal ROS     Musculoskeletal  (+) Arthritis ,    Abdominal   Peds  Hematology negative hematology ROS (+)   Anesthesia Other Findings   Reproductive/Obstetrics  s/p tubal ligation                              Anesthesia Physical Anesthesia Plan  ASA: 3  Anesthesia Plan: General   Post-op Pain Management: Tylenol PO (pre-op)*   Induction: Intravenous  PONV Risk Score and Plan: 2 and Treatment may vary due to age or medical condition, Ondansetron, Dexamethasone and Midazolam  Airway Management Planned: Oral ETT  Additional Equipment: Arterial line  Intra-op Plan:   Post-operative Plan: Extubation in OR  Informed Consent: I have reviewed the patients History and Physical, chart, labs and discussed the procedure including the risks, benefits and alternatives for the proposed anesthesia with the patient or authorized representative who has indicated his/her understanding and acceptance.     Dental advisory given  Plan Discussed with: CRNA and Anesthesiologist  Anesthesia Plan Comments: ( )        Anesthesia Quick Evaluation

## 2022-03-04 NOTE — Progress Notes (Signed)
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  History includes smoking, HTN, anemia, cervical cancer, cerebral aneurysm, tubal sterilization 03/27/18.  BP documented 138/110. Recheck was 140/87. She was on amlodipine 10 mg daily and Maxzide 37.5-25 mg daily, but said she had run out of medications, but has been taking her mother's losartan as needed if she feels her BP is high. She was advised that she should not be taking other individual's medication, but instead encouraged to [facility] as as possible for BP re-evaluation and resumption of BP medications as appropriate, preferable prior to surgery since a significantly elevated BP could lead to case cancellation or delay. I will plan to call Dr. Cleotilde Neer office on Monday (03/07/22), as his office was closed prior to 5:00 PM today (03/04/22). I did send him a staff message.   Reviewed with anesthesiologist Wallis Bamberg, MD.   VS: BP (!) 138/110   Pulse 98   Temp 36.6 C   Resp 16   Ht '5\' 4"'$  (1.626 m)   Wt 50.1 kg   LMP 02/25/2022   SpO2 100%   BMI 18.97 kg/m  BP Readings from Last 3 Encounters:  03/03/22 (!) 138/110  01/31/22 (!) 188/121  03/17/21 (!) 139/111     PROVIDERS: Raiford Simmonds., PA-C is PCP. [facility].     LABS: Labs reviewed: Acceptable for surgery. AST 49, previously 60 03/14/21. ALT, total bili, PLT count normal.  (all labs ordered are listed, but only abnormal results are displayed)  Labs Reviewed  COMPREHENSIVE METABOLIC PANEL - Abnormal; Notable for the following components:      Result  Value   BUN 5 (*)    AST 49 (*)    All other components within normal limits  CBC - Abnormal; Notable for the following components:   MCH 34.3 (*)    All other components within normal limits  TYPE AND SCREEN    IMAGES: CT Maxillofacial 01/31/22: IMPRESSION: 1. Question mild soft tissue swelling around the lips. No evidence of soft tissue abscess. 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 This could be the source of the regional inflammation. 2. 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 This is a very concerning lesion and neurovascular consultation in the near term is recommended.  CTA Chest/abd/pelvis 03/14/21: IMPRESSION: 1. No acute aortic abnormality. 2. No pulmonary embolus. 3. No acute intrathoracic, intra-abdominal, intrapelvic abnormality.    EKG: 03/14/21: Sinus rhythm Probable left atrial enlargement RSR' in V1 or V2, probably normal variant Borderline T abnormalities, anterior leads ST elev, probable normal early repol pattern Borderline prolonged QT interval Confirmed by Sherwood Gambler [phone]) on 03/14/2021 4:23:22 PM - Borderline anterior T wave abnormalities in V2-3 appear new, other EKG appears stable when compared to 04/12/14 tracing.   CV: N/A  Past Medical History:  Diagnosis Date   Anemia    Anxiety    Cancer ([place])    cervical   Depression    Hypertension    Vaginal Pap smear, abnormal     Past Surgical History:  Procedure Laterality Date   NO PAST SURGERIES     TUBAL LIGATION Bilateral 03/27/2018   Procedure: POST PARTUM TUBAL LIGATION;  Surgeon: Truett Mainland, DO;  Location: [facility];  Service: Gynecology;  Laterality: Bilateral;    MEDICATIONS:  amLODipine (NORVASC) 10 MG tablet   amoxicillin-clavulanate (AUGMENTIN) 875-125 MG tablet   ibuprofen (ADVIL) 600 MG tablet   lidocaine (LIDODERM) 5 %   methocarbamol (ROBAXIN) 500 MG tablet   metroNIDAZOLE (FLAGYL) 500 MG tablet    omeprazole (PRILOSEC) 20 MG capsule   ondansetron (ZOFRAN) 4 MG tablet   oxyCODONE-acetaminophen (PERCOCET/ROXICET) 5-325 MG tablet   predniSONE (STERAPRED UNI-PAK 21 TAB) 10 MG (21) TBPK tablet   prenatal vitamin w/FE, FA (PRENATAL 1 + 1) 27-1 MG TABS tablet   triamterene-hydrochlorothiazide (MAXZIDE-25) 37.5-25 MG tablet   No current facility-administered medications for this encounter.    Myra Gianotti, PA-C Surgical Short Stay/Anesthesiology [facility] Phone [phone] [facility] Phone [phone] 03/04/2022 6:42 PM

## 2022-03-08 ENCOUNTER — Encounter (HOSPITAL_COMMUNITY): Payer: Self-pay | Admitting: Neurosurgery

## 2022-03-08 ENCOUNTER — Other Ambulatory Visit: Payer: Self-pay

## 2022-03-08 ENCOUNTER — Inpatient Hospital Stay (HOSPITAL_COMMUNITY)
Admission: RE | Admit: 2022-03-08 | Discharge: 2022-03-08 | Disposition: A | Discharge status: Home / Self Care | Payer: 59 | Source: Ambulatory Visit | Attending: Neurosurgery | Admitting: Neurosurgery

## 2022-03-08 ENCOUNTER — Observation Stay (HOSPITAL_BASED_OUTPATIENT_CLINIC_OR_DEPARTMENT_OTHER): Payer: 59 | Admitting: Certified Registered Nurse Anesthetist

## 2022-03-08 ENCOUNTER — Encounter (HOSPITAL_COMMUNITY)
Admission: AD | Disposition: A | Discharge status: Home / Self Care | Payer: Self-pay | Source: Home / Self Care | Attending: Neurosurgery

## 2022-03-08 ENCOUNTER — Observation Stay (HOSPITAL_COMMUNITY): Payer: 59 | Admitting: Vascular Surgery

## 2022-03-08 ENCOUNTER — Inpatient Hospital Stay (HOSPITAL_COMMUNITY)
Admission: AD | Admit: 2022-03-08 | Discharge: 2022-03-09 | DRG: 027 | Disposition: A | Discharge status: Home / Self Care | Payer: 59 | Attending: Neurosurgery | Admitting: Neurosurgery

## 2022-03-08 DIAGNOSIS — Z91148 Patient's other noncompliance with medication regimen for other reason: Secondary | ICD-10-CM

## 2022-03-08 DIAGNOSIS — R69 Illness, unspecified: Secondary | ICD-10-CM | POA: Diagnosis not present

## 2022-03-08 DIAGNOSIS — I671 Cerebral aneurysm, nonruptured: Secondary | ICD-10-CM

## 2022-03-08 DIAGNOSIS — F17210 Nicotine dependence, cigarettes, uncomplicated: Secondary | ICD-10-CM | POA: Diagnosis present

## 2022-03-08 DIAGNOSIS — Z91040 Latex allergy status: Secondary | ICD-10-CM

## 2022-03-08 DIAGNOSIS — F418 Other specified anxiety disorders: Secondary | ICD-10-CM | POA: Diagnosis not present

## 2022-03-08 DIAGNOSIS — Z8541 Personal history of malignant neoplasm of cervix uteri: Secondary | ICD-10-CM

## 2022-03-08 DIAGNOSIS — I10 Essential (primary) hypertension: Secondary | ICD-10-CM

## 2022-03-08 DIAGNOSIS — K0889 Other specified disorders of teeth and supporting structures: Secondary | ICD-10-CM | POA: Diagnosis present

## 2022-03-08 HISTORY — PX: RADIOLOGY WITH ANESTHESIA: SHX6223

## 2022-03-08 HISTORY — PX: IR TRANSCATH/EMBOLIZ: IMG695

## 2022-03-08 HISTORY — PX: IR NEURO EACH ADD'L AFTER BASIC UNI RIGHT (MS): IMG5374

## 2022-03-08 HISTORY — PX: IR ANGIOGRAM FOLLOW UP STUDY: IMG697

## 2022-03-08 HISTORY — PX: IR ANGIO VERTEBRAL SEL VERTEBRAL BILAT MOD SED: IMG5369

## 2022-03-08 HISTORY — PX: IR ANGIO INTRA EXTRACRAN SEL INTERNAL CAROTID BILAT MOD SED: IMG5363

## 2022-03-08 HISTORY — PX: IR 3D INDEPENDENT WKST: IMG2385

## 2022-03-08 LAB — MRSA NEXT GEN BY PCR, NASAL: MRSA by PCR Next Gen: NOT DETECTED

## 2022-03-08 LAB — POCT PREGNANCY, URINE: Preg Test, Ur: NEGATIVE

## 2022-03-08 SURGERY — IR WITH ANESTHESIA
Anesthesia: General

## 2022-03-08 MED ORDER — LIDOCAINE 2% (20 MG/ML) 5 ML SYRINGE
INTRAMUSCULAR | Status: DC | PRN
  Administered 2022-03-08: 60 mg via INTRAVENOUS

## 2022-03-08 MED ORDER — LIDOCAINE HCL 1 % IJ SOLN
INTRAMUSCULAR | Status: AC
  Filled 2022-03-08: qty 20

## 2022-03-08 MED ORDER — MORPHINE SULFATE (PF) 2 MG/ML IV SOLN
1.0000 mg | INTRAVENOUS | Status: DC | PRN
  Administered 2022-03-08: 2 mg via INTRAVENOUS
  Administered 2022-03-08: 1 mg via INTRAVENOUS
  Filled 2022-03-08 (×2): qty 1

## 2022-03-08 MED ORDER — TRIAMTERENE-HCTZ 37.5-25 MG PO TABS
1.0000 | ORAL_TABLET | Freq: Every day | ORAL | Status: DC
  Administered 2022-03-09: 1 via ORAL
  Filled 2022-03-08: qty 1

## 2022-03-08 MED ORDER — CLEVIDIPINE BUTYRATE 0.5 MG/ML IV EMUL
0.0000 mg/h | INTRAVENOUS | Status: DC
  Administered 2022-03-08 – 2022-03-09 (×2): 2 mg/h via INTRAVENOUS
  Filled 2022-03-08 (×2): qty 50

## 2022-03-08 MED ORDER — OXYCODONE HCL 5 MG/5ML PO SOLN: 5.0000 mg | Freq: Once | ORAL | Status: DC | PRN

## 2022-03-08 MED ORDER — PROMETHAZINE HCL 25 MG/ML IJ SOLN: 6.2500 mg | INTRAMUSCULAR | Status: DC | PRN

## 2022-03-08 MED ORDER — DEXAMETHASONE SODIUM PHOSPHATE 10 MG/ML IJ SOLN
INTRAMUSCULAR | Status: DC | PRN
  Administered 2022-03-08: 8 mg via INTRAVENOUS

## 2022-03-08 MED ORDER — IOHEXOL 300 MG/ML  SOLN
100.0000 mL | Freq: Once | INTRAMUSCULAR | Status: AC | PRN
  Administered 2022-03-08: 21 mL via INTRA_ARTERIAL

## 2022-03-08 MED ORDER — AMLODIPINE BESYLATE 10 MG PO TABS
10.0000 mg | ORAL_TABLET | Freq: Every day | ORAL | Status: DC
  Administered 2022-03-09: 10 mg via ORAL
  Filled 2022-03-08: qty 1

## 2022-03-08 MED ORDER — HYDRALAZINE HCL 20 MG/ML IJ SOLN
10.0000 mg | Freq: Once | INTRAMUSCULAR | Status: AC
  Administered 2022-03-08: 10 mg via INTRAVENOUS
  Filled 2022-03-08: qty 1

## 2022-03-08 MED ORDER — HYDRALAZINE HCL 20 MG/ML IJ SOLN: 10.0000 mg | INTRAMUSCULAR | Status: DC | PRN

## 2022-03-08 MED ORDER — HEPARIN SODIUM (PORCINE) 5000 UNIT/ML IJ SOLN
5000.0000 [IU] | Freq: Three times a day (TID) | INTRAMUSCULAR | Status: DC
  Administered 2022-03-08 – 2022-03-09 (×2): 5000 [IU] via SUBCUTANEOUS
  Filled 2022-03-08 (×2): qty 1

## 2022-03-08 MED ORDER — FENTANYL CITRATE (PF) 100 MCG/2ML IJ SOLN
INTRAMUSCULAR | Status: AC
  Filled 2022-03-08: qty 2

## 2022-03-08 MED ORDER — ONDANSETRON HCL 4 MG/2ML IJ SOLN
4.0000 mg | INTRAMUSCULAR | Status: DC | PRN
  Administered 2022-03-08: 4 mg via INTRAVENOUS
  Filled 2022-03-08: qty 2

## 2022-03-08 MED ORDER — PROMETHAZINE HCL 25 MG PO TABS: 25.0000 mg | ORAL_TABLET | Freq: Four times a day (QID) | ORAL | Status: DC | PRN

## 2022-03-08 MED ORDER — LACTATED RINGERS IV SOLN: INTRAVENOUS | Status: DC | PRN

## 2022-03-08 MED ORDER — HEPARIN SODIUM (PORCINE) 1000 UNIT/ML IJ SOLN
INTRAMUSCULAR | Status: DC | PRN
  Administered 2022-03-08: 5000 [IU] via INTRAVENOUS

## 2022-03-08 MED ORDER — SODIUM CHLORIDE 0.9 % IV SOLN: INTRAVENOUS | Status: DC

## 2022-03-08 MED ORDER — HYDROCODONE-ACETAMINOPHEN 5-325 MG PO TABS: 1.0000 | ORAL_TABLET | ORAL | Status: DC | PRN

## 2022-03-08 MED ORDER — ESMOLOL HCL 100 MG/10ML IV SOLN
INTRAVENOUS | Status: DC | PRN
  Administered 2022-03-08 (×2): 30 mg via INTRAVENOUS

## 2022-03-08 MED ORDER — PHENYLEPHRINE HCL-NACL 20-0.9 MG/250ML-% IV SOLN
INTRAVENOUS | Status: DC | PRN
  Administered 2022-03-08: 10 ug/min via INTRAVENOUS

## 2022-03-08 MED ORDER — ONDANSETRON HCL 4 MG PO TABS: 4.0000 mg | ORAL_TABLET | ORAL | Status: DC | PRN

## 2022-03-08 MED ORDER — OXYCODONE HCL 5 MG PO TABS
5.0000 mg | ORAL_TABLET | Freq: Four times a day (QID) | ORAL | Status: DC | PRN
  Administered 2022-03-08: 5 mg via ORAL
  Administered 2022-03-09: 10 mg via ORAL
  Filled 2022-03-08: qty 1
  Filled 2022-03-08: qty 2

## 2022-03-08 MED ORDER — ACETAMINOPHEN 500 MG PO TABS
1000.0000 mg | ORAL_TABLET | Freq: Once | ORAL | Status: AC
  Administered 2022-03-08: 1000 mg via ORAL
  Filled 2022-03-08: qty 2

## 2022-03-08 MED ORDER — PROMETHAZINE HCL 25 MG RE SUPP: 25.0000 mg | Freq: Four times a day (QID) | RECTAL | Status: DC | PRN

## 2022-03-08 MED ORDER — SUGAMMADEX SODIUM 200 MG/2ML IV SOLN
INTRAVENOUS | Status: DC | PRN
  Administered 2022-03-08: 250 mg via INTRAVENOUS

## 2022-03-08 MED ORDER — SODIUM CHLORIDE 0.9 % IV SOLN
25.0000 mg | Freq: Four times a day (QID) | INTRAVENOUS | Status: DC | PRN
  Administered 2022-03-08: 25 mg via INTRAVENOUS
  Filled 2022-03-08: qty 1

## 2022-03-08 MED ORDER — ONDANSETRON HCL 4 MG/2ML IJ SOLN
INTRAMUSCULAR | Status: DC | PRN
  Administered 2022-03-08 (×2): 4 mg via INTRAVENOUS

## 2022-03-08 MED ORDER — IOHEXOL 300 MG/ML  SOLN
100.0000 mL | Freq: Once | INTRAMUSCULAR | Status: AC | PRN
  Administered 2022-03-08: 28 mL via INTRA_ARTERIAL

## 2022-03-08 MED ORDER — FENTANYL CITRATE (PF) 100 MCG/2ML IJ SOLN
25.0000 ug | INTRAMUSCULAR | Status: DC | PRN
  Administered 2022-03-08: 50 ug via INTRAVENOUS

## 2022-03-08 MED ORDER — PANTOPRAZOLE SODIUM 40 MG IV SOLR
40.0000 mg | Freq: Every day | INTRAVENOUS | Status: DC
  Administered 2022-03-08: 40 mg via INTRAVENOUS
  Filled 2022-03-08: qty 10

## 2022-03-08 MED ORDER — LACTATED RINGERS IV SOLN: INTRAVENOUS | Status: DC

## 2022-03-08 MED ORDER — LABETALOL HCL 5 MG/ML IV SOLN
10.0000 mg | INTRAVENOUS | Status: DC | PRN
  Administered 2022-03-08: 40 mg via INTRAVENOUS
  Administered 2022-03-08: 10 mg via INTRAVENOUS
  Administered 2022-03-08: 20 mg via INTRAVENOUS
  Administered 2022-03-08 (×3): 40 mg via INTRAVENOUS
  Administered 2022-03-08: 20 mg via INTRAVENOUS
  Filled 2022-03-08 (×2): qty 8
  Filled 2022-03-08 (×2): qty 4
  Filled 2022-03-08 (×3): qty 8

## 2022-03-08 MED ORDER — ACETAMINOPHEN 325 MG PO TABS: 650.0000 mg | ORAL_TABLET | Freq: Four times a day (QID) | ORAL | Status: DC | PRN

## 2022-03-08 MED ORDER — SUGAMMADEX SODIUM 200 MG/2ML IV SOLN: INTRAVENOUS | Status: DC | PRN

## 2022-03-08 MED ORDER — ORAL CARE MOUTH RINSE: 15.0000 mL | Freq: Once | OROMUCOSAL | Status: AC

## 2022-03-08 MED ORDER — OXYCODONE HCL 5 MG PO TABS: 5.0000 mg | ORAL_TABLET | Freq: Once | ORAL | Status: DC | PRN

## 2022-03-08 MED ORDER — ACETAMINOPHEN 650 MG RE SUPP: 650.0000 mg | RECTAL | Status: DC | PRN

## 2022-03-08 MED ORDER — LABETALOL HCL 5 MG/ML IV SOLN: 10.0000 mg | INTRAVENOUS | Status: DC | PRN

## 2022-03-08 MED ORDER — PROPOFOL 10 MG/ML IV BOLUS
INTRAVENOUS | Status: DC | PRN
  Administered 2022-03-08: 180 mg via INTRAVENOUS

## 2022-03-08 MED ORDER — FENTANYL CITRATE (PF) 250 MCG/5ML IJ SOLN
INTRAMUSCULAR | Status: DC | PRN
  Administered 2022-03-08 (×2): 50 ug via INTRAVENOUS

## 2022-03-08 MED ORDER — ROCURONIUM BROMIDE 10 MG/ML (PF) SYRINGE
PREFILLED_SYRINGE | INTRAVENOUS | Status: DC | PRN
  Administered 2022-03-08: 40 mg via INTRAVENOUS
  Administered 2022-03-08: 60 mg via INTRAVENOUS

## 2022-03-08 MED ORDER — MIDAZOLAM HCL 2 MG/2ML IJ SOLN
INTRAMUSCULAR | Status: AC
  Filled 2022-03-08: qty 2

## 2022-03-08 MED ORDER — ACETAMINOPHEN 325 MG PO TABS: 650.0000 mg | ORAL_TABLET | ORAL | Status: DC | PRN

## 2022-03-08 MED ORDER — CHLORHEXIDINE GLUCONATE CLOTH 2 % EX PADS
6.0000 | MEDICATED_PAD | Freq: Every day | CUTANEOUS | Status: DC
  Administered 2022-03-08: 6 via TOPICAL

## 2022-03-08 MED ORDER — CHLORHEXIDINE GLUCONATE 0.12 % MT SOLN
15.0000 mL | Freq: Once | OROMUCOSAL | Status: AC
  Administered 2022-03-08: 15 mL via OROMUCOSAL
  Filled 2022-03-08: qty 15

## 2022-03-08 MED ORDER — MIDAZOLAM HCL 2 MG/2ML IJ SOLN
INTRAMUSCULAR | Status: DC | PRN
  Administered 2022-03-08: 2 mg via INTRAVENOUS

## 2022-03-08 NOTE — Brief Op Note (Signed)
  NEUROSURGERY BRIEF OPERATIVE  NOTE   PREOP DX: Aneurysm  POSTOP DX: Same  PROCEDURE: Diagnostic cerebral angiogram, Coil embolization of RICA aneurysm  SURGEON: Dr. Consuella Lose, MD  ANESTHESIA: GETA   APPROACH: Right trans-femoral  EBL: Minimal  SPECIMENS: None  COMPLICATIONS: None  CONDITION: Stable to recovery  FINDINGS (Full report in [facility]): 1. 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 2. 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   NConsuella Lose MD [facility]

## 2022-03-08 NOTE — Transfer of Care (Signed)
Immediate Anesthesia Transfer of Care Note  Patient: Kristin Simmons  Procedure(s) Performed: Diagnostic angiogram, Possible Coil Embolization of Aneurysm  Patient Location: PACU  Anesthesia Type:General  Level of Consciousness: awake, alert , and oriented  Airway & Oxygen Therapy: Patient Spontanous Breathing and Patient connected to nasal cannula oxygen  Post-op Assessment: Report given to RN and Post -op Vital signs reviewed and stable  Post vital signs: Reviewed and stable  Last Vitals:  Vitals Value Taken Time  BP 137/99 03/08/22 1207  Temp    Pulse 94 03/08/22 1210  Resp 18 03/08/22 1210  SpO2 100 % 03/08/22 1210  Vitals shown include unvalidated device data.  Last Pain:  Vitals:   03/08/22 0859  TempSrc: Oral         Complications: No notable events documented.

## 2022-03-08 NOTE — Anesthesia Procedure Notes (Signed)
Arterial Line Insertion Start/End1/23/2024 10:27 AM, 03/08/2022 10:30 AM Performed by: Audry Pili, MD, anesthesiologist  Patient location: OOR procedure area. Preanesthetic checklist: patient identified, IV checked, risks and benefits discussed, surgical consent, monitors and equipment checked, pre-op evaluation, timeout performed and anesthesia consent Patient sedated Left, radial was placed Catheter size: 20 G Hand hygiene performed   Attempts: 1 Procedure performed without using ultrasound guided technique. Following insertion, dressing applied and Biopatch. Post procedure assessment: unchanged and normal  Patient tolerated the procedure well with no immediate complications.

## 2022-03-08 NOTE — Anesthesia Procedure Notes (Signed)
Procedure Name: Intubation Date/Time: 03/08/2022 10:26 AM  Performed by: Inda Coke, CRNAPre-anesthesia Checklist: Patient identified, Emergency Drugs available, Suction available, Timeout performed and Patient being monitored Patient Re-evaluated:Patient Re-evaluated prior to induction Oxygen Delivery Method: Circle system utilized Preoxygenation: Pre-oxygenation with 100% oxygen Induction Type: IV induction Ventilation: Mask ventilation without difficulty Laryngoscope Size: Mac and 3 Grade View: Grade I Tube type: Oral Tube size: 7.0 mm Number of attempts: 1 Airway Equipment and Method: Stylet Placement Confirmation: ETT inserted through vocal cords under direct vision, positive ETCO2, CO2 detector and breath sounds checked- equal and bilateral Secured at: 22 cm Tube secured with: Tape Dental Injury: Teeth and Oropharynx as per pre-operative assessment

## 2022-03-08 NOTE — Progress Notes (Signed)
[name] SBP >160 uncontrolled despite PRN labetalol being given. New onset of vomiting, and the pressure behind her eye has began throbbing and turned into a migraine. Kristin Simmons aware, cleviprex and phenergan orders received but if problem persists once blood pressure is below 160, get a CT.

## 2022-03-08 NOTE — Progress Notes (Signed)
Patient complaining of right eye stinging pressure. When assessed, the patient is able to look to the left smoothly, but when looking to the right, her right eye twitches. Dr. Kathyrn Sheriff notified and aware. MD instructed to monitor closely.

## 2022-03-08 NOTE — Anesthesia Postprocedure Evaluation (Signed)
Anesthesia Post Note  Patient: Kristin Simmons  Procedure(s) Performed: Diagnostic angiogram, Possible Coil Embolization of Aneurysm     Patient location during evaluation: PACU Anesthesia Type: General Level of consciousness: awake and alert Pain management: pain level controlled Vital Signs Assessment: post-procedure vital signs reviewed and stable Respiratory status: spontaneous breathing, nonlabored ventilation and respiratory function stable Cardiovascular status: stable and blood pressure returned to baseline Anesthetic complications: no   No notable events documented.  Last Vitals:  Vitals:   03/08/22 1400 03/08/22 1430  BP: (!) 137/97   Pulse: 82 80  Resp: 16 17  Temp:    SpO2: 94% 99%    Last Pain:  Vitals:   03/08/22 1230  TempSrc:   PainSc: [place]

## 2022-03-08 NOTE — H&P (Signed)
Chief Complaint   Aneurysm  History of Present Illness  Kristin Simmons is a 37 year old woman seen for initial consultation as a referral from recent trips to the emergency department. At the time the patient was having some dental pain. As part of her workup in the ED she underwent maxillofacial CT with contrast which incidentally discovered a right carotid aneurysm. She was therefore referred for neurosurgical evaluation. Upon questioning, the patient has dental pain has largely improved. She does not report any chronic history of headaches or visual changes. No new numbness tingling or weakness.   Of note, the patient does have a history of hypertension, and admits poor medication compliance. No history of diabetes, heart disease, previous stroke, or lung, liver, or kidney disease. She is not on any blood thinners or anti-platelet agents. She is a active smoker. There is a history of ruptured brain aneurysm in her paternal aunt   Past Medical History   Past Medical History:  Diagnosis Date   Anemia    Anxiety    Cancer ([place])    cervical   Depression    Hypertension    Vaginal Pap smear, abnormal     Past Surgical History   Past Surgical History:  Procedure Laterality Date   NO PAST SURGERIES     TUBAL LIGATION Bilateral 03/27/2018   Procedure: POST PARTUM TUBAL LIGATION;  Surgeon: Truett Mainland, DO;  Location: [facility];  Service: Gynecology;  Laterality: Bilateral;    Social History   Social History   Tobacco Use   Smoking status: Every Day    Packs/day: 0.50    Years: 15.00    Total pack years: 7.50    Types: Cigarettes   Smokeless tobacco: Never  Vaping Use   Vaping Use: Never used  Substance Use Topics   Alcohol use: Yes    Alcohol/week: 14.0 standard drinks of alcohol    Types: 14 Cans of beer per week    Comment: 2 beers daily   Drug use: No    Types: Marijuana    Comment: last use 2007    Medications   Prior to Admission medications    Medication Sig Start Date End Date Taking? Authorizing Provider  amLODipine (NORVASC) 10 MG tablet Take 1 tablet (10 mg total) by mouth daily. Patient not taking: Reported on 02/25/2022 03/14/21   Sherwood Gambler, MD  amoxicillin-clavulanate (AUGMENTIN) 875-125 MG tablet Take 1 tablet by mouth every 12 (twelve) hours. Patient not taking: Reported on 02/25/2022 01/31/22   Dion Saucier A, PA  ibuprofen (ADVIL) 600 MG tablet Take 1 tablet (600 mg total) by mouth every 6 (six) hours as needed. Patient not taking: Reported on 02/25/2022 01/31/22   Dion Saucier A, PA  lidocaine (LIDODERM) 5 % Place 1 patch onto the skin daily. Remove & Discard patch within 12 hours or as directed by MD Patient not taking: Reported on 02/25/2022 03/17/21   Triplett, Tammy, PA-C  methocarbamol (ROBAXIN) 500 MG tablet Take 1 tablet (500 mg total) by mouth every 8 (eight) hours as needed for muscle spasms. Patient not taking: Reported on 04/15/2021 03/14/21   Sherwood Gambler, MD  metroNIDAZOLE (FLAGYL) 500 MG tablet Take 1 tablet (500 mg total) by mouth 2 (two) times daily. Patient not taking: Reported on 02/25/2022 01/31/22   Dion Saucier A, PA  omeprazole (PRILOSEC) 20 MG capsule Take 1 capsule (20 mg total) by mouth daily. Patient not taking: Reported on 02/25/2022 03/17/21   Kem Parkinson, PA-C  ondansetron [facility])  4 MG tablet Take 1 tablet (4 mg total) by mouth every 6 (six) hours. Patient not taking: Reported on 02/25/2022 01/31/22   Dion Saucier A, PA  oxyCODONE-acetaminophen (PERCOCET/ROXICET) 5-325 MG tablet Take 1 tablet by mouth every 6 (six) hours as needed. Patient not taking: Reported on 04/15/2021 03/17/21   Triplett, Lynelle Smoke, PA-C  predniSONE (STERAPRED UNI-PAK 21 TAB) 10 MG (21) TBPK tablet TAKE 6,5,4,3,2,1 one tablet less each day with food. Patient not taking: Reported on 02/25/2022 04/15/21   Marybelle Killings, MD  prenatal vitamin w/FE, FA (PRENATAL 1 + 1) 27-1 MG TABS tablet Take 1 tablet by mouth daily at 12  noon. Patient not taking: Reported on 04/18/2018 12/08/15   Estill Dooms, NP  triamterene-hydrochlorothiazide (MAXZIDE-25) 37.5-25 MG tablet Take 1 tablet by mouth daily. Patient not taking: Reported on 02/25/2022 03/14/21   Sherwood Gambler, MD    Allergies   Allergies  Allergen Reactions   Latex Itching and Rash    Review of Systems  ROS  Neurologic Exam  Awake, alert, oriented Memory and concentration grossly intact Speech fluent, appropriate CN grossly intact Motor exam: Upper Extremities Deltoid Bicep Tricep Grip  Right 5/5 5/5 5/5 5/5  Left 5/5 5/5 5/5 5/5   Lower Extremities IP Quad PF DF EHL  Right 5/5 5/5 5/5 5/5 5/5  Left 5/5 5/5 5/5 5/5 5/5   Sensation grossly intact to LT  Imaging  Maxillofacial CT with contrast does reveal an ~76m RICA aneurysm  Impression  - 37y.o. 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 She will therefore require treatment of her aneurysm  Plan  - Will plan on diagnostic angiogram, likely coil embolization of RICA aneurysm  I have reviewed the indications for the procedure as well as the details of the procedure and the expected postoperative course and recovery at length with the patient in the office. We have also reviewed in detail the risks, benefits, and alternatives to the procedure. We did review the possibility of inability to coil the aneurysm which may require a future procedure for treatment of the aneurysm (stent vs. open surgery). All questions were answered and Kristin HAUSERrovided informed consent to proceed.  NeConsuella LoseMD [facility]

## 2022-03-09 ENCOUNTER — Encounter (HOSPITAL_COMMUNITY): Payer: Self-pay | Admitting: Neurosurgery

## 2022-03-09 MED ORDER — PANTOPRAZOLE SODIUM 40 MG PO TBEC: 40.0000 mg | DELAYED_RELEASE_TABLET | Freq: Every day | ORAL | Status: DC

## 2022-03-09 MED ORDER — ACETAMINOPHEN 325 MG PO TABS: 650.0000 mg | ORAL_TABLET | ORAL | Status: DC | PRN

## 2022-03-09 NOTE — Progress Notes (Signed)
  NEUROSURGERY PROGRESS NOTE   No issues overnight. Much improved HA/retro-orbital pain today. No access site issues.  EXAM:  BP (!) 130/97   Pulse 69   Temp 98.1 F (36.7 C) (Oral)   Resp 17   Ht '5\' 4"'$  (1.626 m)   Wt 43.2 kg   LMP 02/25/2022   SpO2 90%   BMI 16.35 kg/m   Awake, alert, oriented  Speech fluent, appropriate  CN grossly intact  5/5 BUE/BLE  Right groin soft  IMPRESSION:  37 y.o. female POD#1 s/p coil embolization RICA aneurysm, doing well.  PLAN: - d/c A-line - Mobilize this am - d/c home   Kristin Lose, MD [facility]

## 2022-03-09 NOTE — Progress Notes (Signed)
Patient discharged home. All instructions covered. Patient was educated on compliance with hypertension medications and all follow up appt. IVs removed patient dressed herself, ambulated to the restroom and wheelchair for transport to her mothers car. All belongings sent with patient and mother.

## 2022-03-09 NOTE — Discharge Summary (Signed)
Physician Discharge Summary  Patient ID: Kristin Simmons MRN: [id number] DOB/AGE: [birth year] 37 y.o.  Admit date: 03/08/2022 Discharge date: 03/09/2022  Admission Diagnoses:  Cerebral aneurysm non-ruptured  Discharge Diagnoses:  Same Principal Problem:   Cerebral aneurysm Active Problems:   Aneurysm, cerebral, nonruptured   Discharged Condition: Stable  Hospital Course:  Kristin Simmons is a 37 y.o. female admitted after elective aneurysm coiling. She was at baseline on POD#1, tolerating diet, voiding normally, and ambulating without assistance. She was discharged home in stable condition.  Treatments: Surgery - RICA aneurysm coil embolization  Discharge Exam: Blood pressure (!) 130/97, pulse 69, temperature 98.1 F (36.7 C), temperature source Oral, resp. rate 17, height '5\' 4"'$  (1.626 m), weight 43.2 kg, last menstrual period 02/25/2022, SpO2 90 %. Awake, alert, oriented Speech fluent, appropriate CN grossly intact 5/5 BUE/BLE Wound c/d/i  Disposition: Discharge disposition: 01-Home or Self Care       Discharge Instructions     Call MD for:  redness, tenderness, or signs of infection (pain, swelling, redness, odor or green/yellow discharge around incision site)   Complete by: As directed    Call MD for:  temperature >100.4   Complete by: As directed    Diet - low sodium heart healthy   Complete by: As directed    Discharge instructions   Complete by: As directed    Walk at home as much as possible, at least 4 times / day   Increase activity slowly   Complete by: As directed    Lifting restrictions   Complete by: As directed    No lifting > 10 lbs   May shower / Bathe   Complete by: As directed    48 hours after surgery   May walk up steps   Complete by: As directed    No wound care   Complete by: As directed    Other Restrictions   Complete by: As directed    No bending/twisting at waist      Allergies as of 03/09/2022       Reactions   Latex  Itching, Rash        Medication List     STOP taking these medications    amoxicillin-clavulanate 875-125 MG tablet Commonly known as: AUGMENTIN   ibuprofen 600 MG tablet Commonly known as: ADVIL   lidocaine 5 % Commonly known as: Lidoderm   methocarbamol 500 MG tablet Commonly known as: ROBAXIN   metroNIDAZOLE 500 MG tablet Commonly known as: FLAGYL   omeprazole 20 MG capsule Commonly known as: PRILOSEC   ondansetron 4 MG tablet Commonly known as: ZOFRAN   oxyCODONE-acetaminophen 5-325 MG tablet Commonly known as: PERCOCET/ROXICET   predniSONE 10 MG (21) Tbpk tablet Commonly known as: STERAPRED UNI-PAK 21 TAB   prenatal vitamin w/FE, FA 27-1 MG Tabs tablet       TAKE these medications    acetaminophen 325 MG tablet Commonly known as: TYLENOL Take 2 tablets (650 mg total) by mouth every 4 (four) hours as needed for mild pain (temp > 100.5).   amLODipine 10 MG tablet Commonly known as: NORVASC Take 1 tablet (10 mg total) by mouth daily.   triamterene-hydrochlorothiazide 37.5-25 MG tablet Commonly known as: MAXZIDE-25 Take 1 tablet by mouth daily.        Follow-up Information     Consuella Lose, MD Follow up.   Specialty: Neurosurgery Contact information: [address] [place] [place] [address] [place] [address] [phone]  SignedJairo Ben 03/09/2022, 9:53 AM

## 2022-05-06 ENCOUNTER — Other Ambulatory Visit: Payer: Self-pay | Admitting: Neurosurgery

## 2022-05-23 ENCOUNTER — Encounter (HOSPITAL_COMMUNITY)
Admission: RE | Admit: 2022-05-23 | Discharge: 2022-05-23 | Disposition: A | Discharge status: Home / Self Care | Payer: Medicaid Other | Source: Ambulatory Visit | Attending: Neurosurgery | Admitting: Neurosurgery

## 2022-05-23 ENCOUNTER — Other Ambulatory Visit: Payer: Self-pay

## 2022-05-23 ENCOUNTER — Encounter (HOSPITAL_COMMUNITY): Payer: Self-pay

## 2022-05-23 VITALS — BP 124/89 | HR 72 | Temp 98.2°F | Resp 18 | Ht 64.0 in | Wt 113.6 lb

## 2022-05-23 DIAGNOSIS — I10 Essential (primary) hypertension: Secondary | ICD-10-CM | POA: Diagnosis not present

## 2022-05-23 DIAGNOSIS — Z01818 Encounter for other preprocedural examination: Secondary | ICD-10-CM | POA: Insufficient documentation

## 2022-05-23 DIAGNOSIS — J45909 Unspecified asthma, uncomplicated: Secondary | ICD-10-CM | POA: Insufficient documentation

## 2022-05-23 DIAGNOSIS — I671 Cerebral aneurysm, nonruptured: Secondary | ICD-10-CM | POA: Diagnosis not present

## 2022-05-23 HISTORY — DX: Headache, unspecified: R51.9

## 2022-05-23 HISTORY — DX: Unspecified osteoarthritis, unspecified site: M19.90

## 2022-05-23 HISTORY — DX: Unspecified asthma, uncomplicated: J45.909

## 2022-05-23 LAB — CBC
HCT: 39.1 % (ref 36.0–46.0)
Hemoglobin: 13 g/dL (ref 12.0–15.0)
MCH: 31.6 pg (ref 26.0–34.0)
MCHC: 33.2 g/dL (ref 30.0–36.0)
MCV: 95.1 fL (ref 80.0–100.0)
Platelets: 324 K/uL (ref 150–400)
RBC: 4.11 MIL/uL (ref 3.87–5.11)
RDW: 14.3 % (ref 11.5–15.5)
WBC: 6.4 K/uL (ref 4.0–10.5)
nRBC: 0 % (ref 0.0–0.2)

## 2022-05-23 LAB — COMPREHENSIVE METABOLIC PANEL
ALT: 20 U/L (ref 0–44)
AST: 21 U/L (ref 15–41)
Albumin: 3.7 g/dL (ref 3.5–5.0)
Alkaline Phosphatase: 43 U/L (ref 38–126)
Anion gap: 8 (ref 5–15)
BUN: 7 mg/dL (ref 6–20)
CO2: 25 mmol/L (ref 22–32)
Calcium: 9.1 mg/dL (ref 8.9–10.3)
Chloride: 102 mmol/L (ref 98–111)
Creatinine, Ser: 0.74 mg/dL (ref 0.44–1.00)
GFR, Estimated: >60 mL/min (ref >60)
Glucose, Bld: 80 mg/dL (ref 70–99)
Potassium: 3.6 mmol/L (ref 3.5–5.1)
Sodium: 135 mmol/L (ref 135–145)
Total Bilirubin: 0.7 mg/dL (ref 0.3–1.2)
Total Protein: 7.1 g/dL (ref 6.5–8.1)

## 2022-05-23 NOTE — Progress Notes (Addendum)
Surgical Instructions   Your procedure is scheduled on Tuesday, 05/31/22 at 7:30 AM  Report to [facility] Main Entrance "A" at 5:30 AM then check in with the Admitting office.  Call this number if you have problems the morning of surgery:  [phone]   If you have any questions prior to your surgery date call [phone]: Open Monday-Friday 8am-4pm If you experience any cold or flu symptoms such as cough, fever, chills, shortness of breath, etc. between now and your scheduled surgery, please notify us at the above number    Remember:  Do not eat or drink after midnight the night before your surgery   Take these medicines the morning of surgery with A SIP OF WATER: None  As of today, STOP taking any Aspirin (unless otherwise instructed by your surgeon) Aleve, Naproxen, Ibuprofen, Motrin, Advil, Goody's, BC's, all herbal medications, fish oil and all vitamins.  [facility] is not responsible for any belongings or valuables.    Do NOT Smoke (Tobacco/Vaping)  24 hours prior to your procedure   Contacts, glasses, hearing aids, dentures or partials may not be worn into surgery, please bring cases for these belongings   For patients admitted to the hospital, discharge time will be determined by your treatment team.   SURGICAL WAITING ROOM VISITATION Patients having surgery or a procedure may have no more than 2 support people in the waiting area - these visitors may rotate.   Children under the age of 109 must have an adult with them who is not the patient. If the patient needs to stay at the hospital during part of their recovery, the visitor guidelines for inpatient rooms apply. Pre-op nurse will coordinate an appropriate time for 1 support person to accompany patient in pre-op.  This support person may not rotate.   Please refer to [email or web address] for the visitor guidelines for Inpatients (after your surgery is over and you are in a  regular room).    [facility]- Preparing For Surgery  Before surgery, you can play an important role. Because skin is not sterile, your skin needs to be as free of germs as possible. You can reduce the number of germs on your skin by washing with CHG (chlorahexidine gluconate) Soap before surgery.  CHG is an antiseptic cleaner which kills germs and bonds with the skin to continue killing germs even after washing.    Please do not use if you have an allergy to CHG or antibacterial soaps. If your skin becomes reddened/irritated stop using the CHG.  Do not shave (including legs and underarms) for at least 48 hours prior to first CHG shower. It is OK to shave your face.  Please follow these instructions carefully.    Shower the NIGHT BEFORE SURGERY and the MORNING OF SURGERY with CHG Soap.   If you chose to wash your hair, wash your hair first as usual with your normal shampoo. After you shampoo, rinse your hair and body thoroughly to remove the shampoo.  Then [employer] and genitals (private parts) with your normal soap and rinse thoroughly to remove soap.  After that Use CHG Soap as you would any other liquid soap. You can apply CHG directly to the skin and wash gently with a scrungie or a clean washcloth.   Apply the CHG Soap to your body ONLY FROM THE NECK DOWN.  Do not use on open wounds or open sores. Avoid contact with your eyes, ears, mouth and genitals (private parts). Wash Face and genitals (  private parts)  with your normal soap.   Wash thoroughly, paying special attention to the area where your surgery will be performed.  Thoroughly rinse your body with warm water from the neck down.  DO NOT shower/wash with your normal soap after using and rinsing off the CHG Soap.  Pat yourself dry with a CLEAN TOWEL.  Wear CLEAN PAJAMAS to bed the night before surgery  Place CLEAN SHEETS on your bed the night before your surgery  DO NOT SLEEP WITH PETS.  Day of Surgery: Shower as above Do  not wear jewelry or makeup. Do not wear lotions, powders, perfume or deodorant. Do not shave 48 hours prior to surgery.   Do not bring valuables to the hospital. Do not wear nail polish, gel polish, artificial nails, or any other type of covering on natural nails (fingers and toes) If you have artificial nails or gel coating that need to be removed by a nail salon, please have this removed prior to surgery. Artificial nails or gel coating may interfere with anesthesia's ability to adequately monitor your vital signs.  Special instructions:    Oral Hygiene is also important to reduce your risk of infection.  Remember - BRUSH YOUR TEETH THE MORNING OF SURGERY WITH YOUR REGULAR TOOTHPASTE   Please read over the fact sheets that you were given.

## 2022-05-23 NOTE — Progress Notes (Signed)
PCP - Kizzie Furnish, PA  EKG - 05/23/22      Anesthesia review: for Revonda Standard to review, wrote a note on 03/03/22. Pt's BP today was 124/89  Patient denies shortness of breath, fever, cough and chest pain at PAT appointment   All instructions explained to the patient, with a verbal understanding of the material. Patient agrees to go over the instructions while at home for a better understanding. Patient also instructed to self quarantine after being tested for COVID-19. The opportunity to ask questions was provided.

## 2022-05-24 NOTE — Anesthesia Preprocedure Evaluation (Addendum)
Anesthesia Evaluation  Patient identified by MRN, date of birth, ID band Patient awake    Reviewed: Allergy & Precautions, NPO status , [name] Chart, lab work & pertinent test results  History of Anesthesia Complications Negative for: history of anesthetic complications  Airway Mallampati: II  TM Distance: >3 FB Neck ROM: Full    Dental  (+) Dental Advisory Given   Pulmonary Current Smoker and Patient abstained from smoking.   breath sounds clear to auscultation       Cardiovascular hypertension, Pt. on medications (-) angina  Rhythm:Regular Rate:Normal     Neuro/Psych  Headaches  Anxiety Depression    Cerebral aneurysm    GI/Hepatic ,GERD  Controlled,,(+)     substance abuse  cocaine use  Endo/Other  negative endocrine ROS    Renal/GU negative Renal ROS     Musculoskeletal   Abdominal   Peds  Hematology  (+) Blood dyscrasia, anemia   Anesthesia Other Findings   Reproductive/Obstetrics                              Anesthesia Physical Anesthesia Plan  ASA: 3  Anesthesia Plan: General   Post-op Pain Management: Tylenol PO (pre-op)*   Induction: Intravenous  PONV Risk Score and Plan: 2 and Ondansetron and Dexamethasone  Airway Management Planned: Oral ETT  Additional Equipment: Arterial line  Intra-op Plan:   Post-operative Plan: Extubation in OR  Informed Consent: I have reviewed the patients History and Physical, chart, labs and discussed the procedure including the risks, benefits and alternatives for the proposed anesthesia with the patient or authorized representative who has indicated his/her understanding and acceptance.     Dental advisory given  Plan Discussed with: CRNA and Surgeon  Anesthesia Plan Comments: (PAT note written 05/24/2022 by Shonna Chock, PA-C.  )        Anesthesia Quick Evaluation

## 2022-05-24 NOTE — Progress Notes (Signed)
Anesthesia Chart Review:  Case: [id number] Date/Time: 05/31/22 0715   Procedure: PTERIONAL CRANIOTOMY FOR CLIPPING OF MCA ANEURYSM (Right)   Anesthesia type: General   Pre-op diagnosis: CEREBRAL ANEURYSM   [facility] / [facility]   Surgeons: Lisbeth Renshaw, MD       DISCUSSION: Patient is a 37 year old female scheduled for the above procedure. She underwent diagnostic cerebral angiogram on 03/08/22 showing 6 mm right ophthalmic aneurysm, and irregular 4 mm aneurysm at that origin of the right anterior temporal branch. She underwent coil embolization of the right ophthalmic aneurysm, and is now for intervention for right MCA aneurysm.   History includes smoking, HTN, anemia, asthma, cervical cancer, cerebral aneurysm (s/p coil embolization of RICA/ophthalmic aneurysm 03/08/22), tubal sterilization (03/27/18). Alcohol use is documented as 14 standard drinks per week.  Prior to her 03/08/22 coil embolization of RICA aneurysm, she was taking her mother's losartan's as needed if she felt her BP was high. (She had previously been on amlodipine 10 mg daily and Maxzide 37.5-25 mg daily.). She reported that her BP readings had improved and now she is not taking any anti-hypertensives. BP at 05/23/22 PAT was 124/89.   Anesthesia team to evaluate on the day of surgery.   VS: BP 124/89   Pulse 72   Temp 36.8 C (Oral)   Resp 18   Ht 5\' 4"  (1.626 m)   Wt 51.5 kg   LMP 04/26/2022   SpO2 100%   BMI 19.50 kg/m  BP Readings from Last 3 Encounters:  05/23/22 124/89  03/09/22 (!) 130/96  03/04/22 (!) 140/87    PROVIDERS: Tylene Fantasia., PA-C is PCP. [facility]       LABS: Labs reviewed: Acceptable for surgery. (all labs ordered are listed, but only abnormal results are displayed)  Labs Reviewed  COMPREHENSIVE METABOLIC PANEL  CBC  TYPE AND SCREEN     IMAGES: Diagnostic Cerebral Angiogram with Coil Embolization 03/08/22: IMPRESSION: 1.  Successful coil embolization of a 6 mm right ophthalmic aneurysm with minimal neck filling post treatment. 2. Irregular appearing aneurysm at the origin of the right anterior temporal branch measuring approximately 4 mm in maximal dimension.  CTA Chest/abd/pelvis 03/14/21: IMPRESSION: 1. No acute aortic abnormality. 2. No pulmonary embolus. 3. No acute intrathoracic, intra-abdominal, intrapelvic abnormality.     EKG:  EKG 05/23/22: Normal sinus rhythm RSR prime leads V1 and V2, probably normal variant. Confirmed by Tessa Lerner [phone]) on 05/23/2022 11:47:59 PM  EKG 03/14/21: Sinus rhythm Probable left atrial enlargement RSR' in V1 or V2, probably normal variant Borderline T abnormalities, anterior leads ST elev, probable normal early repol pattern Borderline prolonged QT interval Confirmed by Pricilla Loveless [phone]) on 03/14/2021 4:23:22 PM - Borderline anterior T wave abnormalities in V2-3 appear new, other EKG appears stable when compared to 04/12/14 tracing.     CV: N/A  Past Medical History:  Diagnosis Date   Anemia    Anxiety    Arthritis    Asthma    Cancer    cervical   Depression    Headache    Hypertension    She was taking her mother's blood pressure meds but states she doesn't need bp meds now.   Vaginal Pap smear, abnormal     Past Surgical History:  Procedure Laterality Date   IR 3D INDEPENDENT WKST  03/08/2022   IR ANGIO INTRA EXTRACRAN SEL INTERNAL CAROTID BILAT MOD SED  03/08/2022   IR  ANGIO VERTEBRAL SEL VERTEBRAL BILAT MOD SED  03/08/2022   IR ANGIOGRAM FOLLOW UP STUDY  03/08/2022   IR ANGIOGRAM FOLLOW UP STUDY  03/08/2022   IR ANGIOGRAM FOLLOW UP STUDY  03/08/2022   IR NEURO EACH ADD'L AFTER BASIC UNI RIGHT (MS)  03/08/2022   IR TRANSCATH/EMBOLIZ  03/08/2022   NO PAST SURGERIES     RADIOLOGY WITH ANESTHESIA N/A 03/08/2022   Procedure: Diagnostic angiogram, Possible Coil Embolization of Aneurysm;  Surgeon: Lisbeth Renshaw, MD;  Location: [facility] OR;  Service:  Radiology;  Laterality: N/A;   TUBAL LIGATION Bilateral 03/27/2018   Procedure: POST PARTUM TUBAL LIGATION;  Surgeon: Levie Heritage, DO;  [facility]: [facility];  Service: Gynecology;  Laterality: Bilateral;    MEDICATIONS:  naproxen sodium (ALEVE) 220 MG tablet   acetaminophen (TYLENOL) 325 MG tablet   amLODipine (NORVASC) 10 MG tablet   triamterene-hydrochlorothiazide (MAXZIDE-25) 37.5-25 MG tablet   No current facility-administered medications for this encounter.   She is not currently taking any routine medications.  Shonna Chock, PA-C Surgical Short Stay/Anesthesiology [facility] Phone [phone] [facility] Phone [phone] 05/24/2022 6:57 PM

## 2022-05-31 ENCOUNTER — Other Ambulatory Visit: Payer: Self-pay

## 2022-05-31 ENCOUNTER — Inpatient Hospital Stay (HOSPITAL_COMMUNITY): Payer: Medicaid Other | Admitting: Certified Registered"

## 2022-05-31 ENCOUNTER — Inpatient Hospital Stay (HOSPITAL_COMMUNITY): Payer: Medicaid Other

## 2022-05-31 ENCOUNTER — Encounter (HOSPITAL_COMMUNITY)
Admission: RE | Disposition: A | Discharge status: Rehabilitation Facility | Payer: Self-pay | Source: Home / Self Care | Attending: Neurosurgery

## 2022-05-31 ENCOUNTER — Inpatient Hospital Stay (HOSPITAL_COMMUNITY): Payer: Medicaid Other | Admitting: Vascular Surgery

## 2022-05-31 ENCOUNTER — Encounter (HOSPITAL_COMMUNITY): Payer: Self-pay | Admitting: Neurosurgery

## 2022-05-31 ENCOUNTER — Inpatient Hospital Stay (HOSPITAL_COMMUNITY)
Admission: RE | Admit: 2022-05-31 | Discharge: 2022-06-21 | DRG: 020 | Disposition: A | Discharge status: Rehabilitation Facility | Payer: Medicaid Other | Attending: Neurosurgery | Admitting: Neurosurgery

## 2022-05-31 DIAGNOSIS — Z820 Family history of epilepsy and other diseases of the nervous system: Secondary | ICD-10-CM

## 2022-05-31 DIAGNOSIS — I10 Essential (primary) hypertension: Secondary | ICD-10-CM | POA: Diagnosis present

## 2022-05-31 DIAGNOSIS — I6011 Nontraumatic subarachnoid hemorrhage from right middle cerebral artery: Principal | ICD-10-CM | POA: Diagnosis not present

## 2022-05-31 DIAGNOSIS — R739 Hyperglycemia, unspecified: Secondary | ICD-10-CM | POA: Diagnosis present

## 2022-05-31 DIAGNOSIS — E871 Hypo-osmolality and hyponatremia: Secondary | ICD-10-CM | POA: Diagnosis not present

## 2022-05-31 DIAGNOSIS — D6489 Other specified anemias: Secondary | ICD-10-CM | POA: Diagnosis not present

## 2022-05-31 DIAGNOSIS — J9589 Other postprocedural complications and disorders of respiratory system, not elsewhere classified: Secondary | ICD-10-CM | POA: Diagnosis not present

## 2022-05-31 DIAGNOSIS — I671 Cerebral aneurysm, nonruptured: Secondary | ICD-10-CM | POA: Diagnosis present

## 2022-05-31 DIAGNOSIS — I63511 Cerebral infarction due to unspecified occlusion or stenosis of right middle cerebral artery: Secondary | ICD-10-CM | POA: Diagnosis not present

## 2022-05-31 DIAGNOSIS — R1312 Dysphagia, oropharyngeal phase: Secondary | ICD-10-CM | POA: Diagnosis not present

## 2022-05-31 DIAGNOSIS — I720 Aneurysm of carotid artery: Secondary | ICD-10-CM | POA: Diagnosis present

## 2022-05-31 DIAGNOSIS — L299 Pruritus, unspecified: Secondary | ICD-10-CM | POA: Diagnosis not present

## 2022-05-31 DIAGNOSIS — I67848 Other cerebrovascular vasospasm and vasoconstriction: Secondary | ICD-10-CM | POA: Diagnosis not present

## 2022-05-31 DIAGNOSIS — F17290 Nicotine dependence, other tobacco product, uncomplicated: Secondary | ICD-10-CM | POA: Diagnosis present

## 2022-05-31 DIAGNOSIS — J14 Pneumonia due to Hemophilus influenzae: Secondary | ICD-10-CM | POA: Diagnosis not present

## 2022-05-31 DIAGNOSIS — E876 Hypokalemia: Secondary | ICD-10-CM | POA: Diagnosis not present

## 2022-05-31 DIAGNOSIS — I97418 Intraoperative hemorrhage and hematoma of a circulatory system organ or structure complicating other circulatory system procedure: Secondary | ICD-10-CM | POA: Diagnosis not present

## 2022-05-31 DIAGNOSIS — Z8541 Personal history of malignant neoplasm of cervix uteri: Secondary | ICD-10-CM

## 2022-05-31 DIAGNOSIS — Z9889 Other specified postprocedural states: Principal | ICD-10-CM

## 2022-05-31 DIAGNOSIS — F17210 Nicotine dependence, cigarettes, uncomplicated: Secondary | ICD-10-CM | POA: Diagnosis not present

## 2022-05-31 DIAGNOSIS — I619 Nontraumatic intracerebral hemorrhage, unspecified: Secondary | ICD-10-CM | POA: Diagnosis not present

## 2022-05-31 DIAGNOSIS — J1569 Pneumonia due to other gram-negative bacteria: Secondary | ICD-10-CM | POA: Diagnosis not present

## 2022-05-31 DIAGNOSIS — I609 Nontraumatic subarachnoid hemorrhage, unspecified: Secondary | ICD-10-CM

## 2022-05-31 DIAGNOSIS — T361X5A Adverse effect of cephalosporins and other beta-lactam antibiotics, initial encounter: Secondary | ICD-10-CM | POA: Diagnosis not present

## 2022-05-31 DIAGNOSIS — J690 Pneumonitis due to inhalation of food and vomit: Secondary | ICD-10-CM | POA: Diagnosis present

## 2022-05-31 DIAGNOSIS — G9732 Intraoperative hemorrhage and hematoma of a nervous system organ or structure complicating other procedure: Secondary | ICD-10-CM | POA: Diagnosis not present

## 2022-05-31 DIAGNOSIS — I6010 Nontraumatic subarachnoid hemorrhage from unspecified middle cerebral artery: Secondary | ICD-10-CM

## 2022-05-31 DIAGNOSIS — M25512 Pain in left shoulder: Secondary | ICD-10-CM | POA: Diagnosis not present

## 2022-05-31 DIAGNOSIS — Z91148 Patient's other noncompliance with medication regimen for other reason: Secondary | ICD-10-CM

## 2022-05-31 DIAGNOSIS — R569 Unspecified convulsions: Secondary | ICD-10-CM | POA: Diagnosis not present

## 2022-05-31 DIAGNOSIS — G936 Cerebral edema: Secondary | ICD-10-CM | POA: Diagnosis not present

## 2022-05-31 DIAGNOSIS — F418 Other specified anxiety disorders: Secondary | ICD-10-CM

## 2022-05-31 DIAGNOSIS — I6389 Other cerebral infarction: Secondary | ICD-10-CM | POA: Diagnosis not present

## 2022-05-31 DIAGNOSIS — G935 Compression of brain: Secondary | ICD-10-CM | POA: Diagnosis not present

## 2022-05-31 DIAGNOSIS — I739 Peripheral vascular disease, unspecified: Secondary | ICD-10-CM | POA: Diagnosis not present

## 2022-05-31 DIAGNOSIS — A4159 Other Gram-negative sepsis: Secondary | ICD-10-CM | POA: Diagnosis not present

## 2022-05-31 DIAGNOSIS — G9782 Other postprocedural complications and disorders of nervous system: Secondary | ICD-10-CM | POA: Diagnosis not present

## 2022-05-31 DIAGNOSIS — K5900 Constipation, unspecified: Secondary | ICD-10-CM | POA: Diagnosis not present

## 2022-05-31 DIAGNOSIS — Z8249 Family history of ischemic heart disease and other diseases of the circulatory system: Secondary | ICD-10-CM

## 2022-05-31 DIAGNOSIS — R29712 NIHSS score 12: Secondary | ICD-10-CM | POA: Diagnosis not present

## 2022-05-31 DIAGNOSIS — R7989 Other specified abnormal findings of blood chemistry: Secondary | ICD-10-CM | POA: Diagnosis not present

## 2022-05-31 DIAGNOSIS — R29810 Facial weakness: Secondary | ICD-10-CM | POA: Diagnosis not present

## 2022-05-31 DIAGNOSIS — G8194 Hemiplegia, unspecified affecting left nondominant side: Secondary | ICD-10-CM | POA: Diagnosis not present

## 2022-05-31 DIAGNOSIS — I959 Hypotension, unspecified: Secondary | ICD-10-CM | POA: Diagnosis not present

## 2022-05-31 DIAGNOSIS — Z8349 Family history of other endocrine, nutritional and metabolic diseases: Secondary | ICD-10-CM

## 2022-05-31 DIAGNOSIS — Z818 Family history of other mental and behavioral disorders: Secondary | ICD-10-CM

## 2022-05-31 DIAGNOSIS — Z91040 Latex allergy status: Secondary | ICD-10-CM

## 2022-05-31 DIAGNOSIS — Z825 Family history of asthma and other chronic lower respiratory diseases: Secondary | ICD-10-CM

## 2022-05-31 DIAGNOSIS — I629 Nontraumatic intracranial hemorrhage, unspecified: Secondary | ICD-10-CM | POA: Diagnosis not present

## 2022-05-31 DIAGNOSIS — Z833 Family history of diabetes mellitus: Secondary | ICD-10-CM

## 2022-05-31 DIAGNOSIS — D75839 Thrombocytosis, unspecified: Secondary | ICD-10-CM | POA: Diagnosis not present

## 2022-05-31 DIAGNOSIS — G9349 Other encephalopathy: Secondary | ICD-10-CM | POA: Diagnosis not present

## 2022-05-31 DIAGNOSIS — Z79899 Other long term (current) drug therapy: Secondary | ICD-10-CM

## 2022-05-31 DIAGNOSIS — R140 Abdominal distension (gaseous): Secondary | ICD-10-CM | POA: Diagnosis not present

## 2022-05-31 DIAGNOSIS — R0689 Other abnormalities of breathing: Secondary | ICD-10-CM | POA: Diagnosis not present

## 2022-05-31 DIAGNOSIS — R451 Restlessness and agitation: Secondary | ICD-10-CM | POA: Diagnosis not present

## 2022-05-31 DIAGNOSIS — J988 Other specified respiratory disorders: Secondary | ICD-10-CM | POA: Diagnosis not present

## 2022-05-31 DIAGNOSIS — R3589 Other polyuria: Secondary | ICD-10-CM | POA: Diagnosis not present

## 2022-05-31 DIAGNOSIS — Z01818 Encounter for other preprocedural examination: Secondary | ICD-10-CM

## 2022-05-31 DIAGNOSIS — R339 Retention of urine, unspecified: Secondary | ICD-10-CM | POA: Diagnosis not present

## 2022-05-31 HISTORY — PX: CRANIOTOMY: SHX93

## 2022-05-31 LAB — BPAM RBC
Blood Product Expiration Date: 202405152359
ISSUE DATE / TIME: 202404160829
ISSUE DATE / TIME: 202404160955
Unit Type and Rh: 5100
Unit Type and Rh: 5100
Unit Type and Rh: 5100

## 2022-05-31 LAB — POCT I-STAT 7, (LYTES, BLD GAS, ICA,H+H)
Acid-base deficit: 6 mmol/L — ABNORMAL HIGH (ref 0.0–2.0)
Bicarbonate: 17.6 mmol/L — ABNORMAL LOW (ref 20.0–28.0)
Calcium, Ion: 1.14 mmol/L — ABNORMAL LOW (ref 1.15–1.40)
HCT: 46 % (ref 36.0–46.0)
Hemoglobin: 15.6 g/dL — ABNORMAL HIGH (ref 12.0–15.0)
O2 Saturation: 100 %
Patient temperature: 97.5
Potassium: 3.7 mmol/L (ref 3.5–5.1)
Sodium: 132 mmol/L — ABNORMAL LOW (ref 135–145)
TCO2: 18 mmol/L — ABNORMAL LOW (ref 22–32)
pCO2 arterial: 27.9 mmHg — ABNORMAL LOW (ref 32–48)
pH, Arterial: 7.405 (ref 7.35–7.45)
pO2, Arterial: 239 mmHg — ABNORMAL HIGH (ref 83–108)

## 2022-05-31 LAB — TYPE AND SCREEN
ABO/RH(D): O POS
Unit division: 0
Unit division: 0

## 2022-05-31 LAB — POCT PREGNANCY, URINE: Preg Test, Ur: NEGATIVE

## 2022-05-31 LAB — PREPARE RBC (CROSSMATCH)

## 2022-05-31 LAB — MRSA NEXT GEN BY PCR, NASAL: MRSA by PCR Next Gen: NOT DETECTED

## 2022-05-31 SURGERY — CRANIOTOMY INTRACRANIAL ANEURYSM FOR CAROTID
Anesthesia: General | Laterality: Right

## 2022-05-31 MED ORDER — PROPOFOL 10 MG/ML IV BOLUS
INTRAVENOUS | Status: AC
  Filled 2022-05-31: qty 20

## 2022-05-31 MED ORDER — DOCUSATE SODIUM 50 MG/5ML PO LIQD
100.0000 mg | Freq: Two times a day (BID) | ORAL | Status: DC
  Administered 2022-05-31 (×2): 100 mg
  Filled 2022-05-31 (×2): qty 10

## 2022-05-31 MED ORDER — SODIUM CHLORIDE 0.9 % IV SOLN: INTRAVENOUS | Status: DC

## 2022-05-31 MED ORDER — FENTANYL CITRATE (PF) 250 MCG/5ML IJ SOLN
INTRAMUSCULAR | Status: AC
  Filled 2022-05-31: qty 5

## 2022-05-31 MED ORDER — PROPOFOL 1000 MG/100ML IV EMUL
0.0000 ug/kg/min | INTRAVENOUS | Status: DC
  Administered 2022-05-31 – 2022-06-02 (×3): 15 ug/kg/min via INTRAVENOUS
  Administered 2022-06-04: 45 ug/kg/min via INTRAVENOUS
  Filled 2022-05-31 (×7): qty 100

## 2022-05-31 MED ORDER — INDOCYANINE GREEN 25 MG IV SOLR
INTRAVENOUS | Status: AC
  Filled 2022-05-31: qty 10

## 2022-05-31 MED ORDER — PANTOPRAZOLE SODIUM 40 MG IV SOLR
40.0000 mg | Freq: Every day | INTRAVENOUS | Status: DC
Start: 2022-05-31 — End: 2022-05-31

## 2022-05-31 MED ORDER — MIDAZOLAM HCL 2 MG/2ML IJ SOLN
INTRAMUSCULAR | Status: DC | PRN
  Administered 2022-05-31: 1 mg via INTRAVENOUS

## 2022-05-31 MED ORDER — ORAL CARE MOUTH RINSE: 15.0000 mL | Freq: Once | OROMUCOSAL | Status: AC

## 2022-05-31 MED ORDER — FENTANYL CITRATE PF 50 MCG/ML IJ SOSY
50.0000 ug | PREFILLED_SYRINGE | INTRAMUSCULAR | Status: DC | PRN
  Administered 2022-05-31: 50 ug via INTRAVENOUS
  Administered 2022-05-31 – 2022-06-01 (×4): 100 ug via INTRAVENOUS
  Filled 2022-05-31 (×5): qty 2

## 2022-05-31 MED ORDER — DEXAMETHASONE SODIUM PHOSPHATE 10 MG/ML IJ SOLN
INTRAMUSCULAR | Status: AC
  Filled 2022-05-31: qty 1

## 2022-05-31 MED ORDER — BUPIVACAINE HCL (PF) 0.5 % IJ SOLN
INTRAMUSCULAR | Status: AC
  Filled 2022-05-31: qty 30

## 2022-05-31 MED ORDER — TRIAMTERENE-HCTZ 37.5-25 MG PO TABS
1.0000 | ORAL_TABLET | Freq: Every day | ORAL | Status: DC
  Filled 2022-05-31: qty 1

## 2022-05-31 MED ORDER — LEVETIRACETAM IN NACL 500 MG/100ML IV SOLN
500.0000 mg | Freq: Two times a day (BID) | INTRAVENOUS | Status: DC
  Administered 2022-05-31 – 2022-06-07 (×14): 500 mg via INTRAVENOUS
  Filled 2022-05-31 (×15): qty 100

## 2022-05-31 MED ORDER — HYDRALAZINE HCL 20 MG/ML IJ SOLN
10.0000 mg | INTRAMUSCULAR | Status: DC | PRN
  Administered 2022-06-03 (×4): 20 mg via INTRAVENOUS
  Administered 2022-06-04 – 2022-06-06 (×3): 40 mg via INTRAVENOUS
  Administered 2022-06-11: 20 mg via INTRAVENOUS
  Filled 2022-05-31 (×2): qty 1
  Filled 2022-05-31: qty 2
  Filled 2022-05-31 (×3): qty 1
  Filled 2022-05-31 (×4): qty 2
  Filled 2022-05-31: qty 1

## 2022-05-31 MED ORDER — SODIUM CHLORIDE 0.9 % IV SOLN
0.1000 ug/kg/min | INTRAVENOUS | Status: DC
  Filled 2022-05-31: qty 5000

## 2022-05-31 MED ORDER — MIDAZOLAM HCL 2 MG/2ML IJ SOLN
INTRAMUSCULAR | Status: AC
  Filled 2022-05-31: qty 2

## 2022-05-31 MED ORDER — BACITRACIN ZINC 500 UNIT/GM EX OINT
TOPICAL_OINTMENT | CUTANEOUS | Status: AC
  Filled 2022-05-31: qty 28.35

## 2022-05-31 MED ORDER — 0.9 % SODIUM CHLORIDE (POUR BTL) OPTIME
TOPICAL | Status: DC | PRN
  Administered 2022-05-31 (×3): 1000 mL

## 2022-05-31 MED ORDER — ORAL CARE MOUTH RINSE
15.0000 mL | OROMUCOSAL | Status: DC
  Administered 2022-05-31 – 2022-06-05 (×63): 15 mL via OROMUCOSAL

## 2022-05-31 MED ORDER — ACETAMINOPHEN 500 MG PO TABS: 1000.0000 mg | ORAL_TABLET | Freq: Once | ORAL | Status: AC

## 2022-05-31 MED ORDER — LIDOCAINE 2% (20 MG/ML) 5 ML SYRINGE
INTRAMUSCULAR | Status: DC | PRN
  Administered 2022-05-31: 20 mg via INTRAVENOUS

## 2022-05-31 MED ORDER — ACETAMINOPHEN 650 MG RE SUPP: 650.0000 mg | RECTAL | Status: DC | PRN

## 2022-05-31 MED ORDER — CHLORHEXIDINE GLUCONATE 0.12 % MT SOLN: 15.0000 mL | Freq: Once | OROMUCOSAL | Status: AC

## 2022-05-31 MED ORDER — HYDRALAZINE HCL 20 MG/ML IJ SOLN
INTRAMUSCULAR | Status: AC
  Administered 2022-05-31: 40 mg via INTRAVENOUS
  Filled 2022-05-31: qty 1

## 2022-05-31 MED ORDER — NIMODIPINE 30 MG PO CAPS
60.0000 mg | ORAL_CAPSULE | ORAL | Status: DC
  Administered 2022-06-11 (×2): 60 mg via ORAL
  Filled 2022-05-31 (×4): qty 2

## 2022-05-31 MED ORDER — ACETAMINOPHEN 500 MG PO TABS
ORAL_TABLET | ORAL | Status: AC
  Administered 2022-05-31: 1000 mg via ORAL
  Filled 2022-05-31: qty 2

## 2022-05-31 MED ORDER — FUROSEMIDE 10 MG/ML IJ SOLN
INTRAMUSCULAR | Status: DC | PRN
  Administered 2022-05-31: 10 mg via INTRAMUSCULAR

## 2022-05-31 MED ORDER — THROMBIN 20000 UNITS EX SOLR
CUTANEOUS | Status: AC
  Filled 2022-05-31: qty 20000

## 2022-05-31 MED ORDER — INDOCYANINE GREEN 25 MG IV SOLR
INTRAVENOUS | Status: DC | PRN
  Administered 2022-05-31: 12.5 mg via INTRAVENOUS

## 2022-05-31 MED ORDER — PANTOPRAZOLE SODIUM 40 MG IV SOLR: 40.0000 mg | Freq: Every day | INTRAVENOUS | Status: DC

## 2022-05-31 MED ORDER — CHLORHEXIDINE GLUCONATE CLOTH 2 % EX PADS
6.0000 | MEDICATED_PAD | Freq: Every day | CUTANEOUS | Status: DC
  Administered 2022-05-31 – 2022-06-21 (×22): 6 via TOPICAL

## 2022-05-31 MED ORDER — CHLORHEXIDINE GLUCONATE 0.12 % MT SOLN
OROMUCOSAL | Status: AC
  Administered 2022-05-31: 15 mL via OROMUCOSAL
  Filled 2022-05-31: qty 15

## 2022-05-31 MED ORDER — STROKE: EARLY STAGES OF RECOVERY BOOK
Freq: Once | Status: AC
  Filled 2022-05-31: qty 1

## 2022-05-31 MED ORDER — FAMOTIDINE 20 MG PO TABS
20.0000 mg | ORAL_TABLET | Freq: Two times a day (BID) | ORAL | Status: DC
  Administered 2022-05-31 – 2022-06-04 (×10): 20 mg
  Filled 2022-05-31 (×10): qty 1

## 2022-05-31 MED ORDER — PHENYLEPHRINE HCL-NACL 20-0.9 MG/250ML-% IV SOLN
INTRAVENOUS | Status: DC | PRN
  Administered 2022-05-31: 15 ug/min via INTRAVENOUS

## 2022-05-31 MED ORDER — MANNITOL 20 % IV SOLN: INTRAVENOUS | Status: DC | PRN

## 2022-05-31 MED ORDER — SODIUM CHLORIDE 0.9 % IV SOLN: INTRAVENOUS | Status: DC | PRN

## 2022-05-31 MED ORDER — LIDOCAINE HCL (PF) 1 % IJ SOLN
INTRAMUSCULAR | Status: DC | PRN
  Administered 2022-05-31: 5 mL
  Administered 2022-05-31: 4 mL

## 2022-05-31 MED ORDER — LACTATED RINGERS IV SOLN: INTRAVENOUS | Status: DC

## 2022-05-31 MED ORDER — LIDOCAINE 2% (20 MG/ML) 5 ML SYRINGE
INTRAMUSCULAR | Status: AC
  Filled 2022-05-31: qty 5

## 2022-05-31 MED ORDER — ROCURONIUM BROMIDE 10 MG/ML (PF) SYRINGE
PREFILLED_SYRINGE | INTRAVENOUS | Status: AC
  Filled 2022-05-31: qty 10

## 2022-05-31 MED ORDER — CEFAZOLIN SODIUM-DEXTROSE 2-4 GM/100ML-% IV SOLN
INTRAVENOUS | Status: AC
  Filled 2022-05-31: qty 100

## 2022-05-31 MED ORDER — NIMODIPINE 6 MG/ML PO SOLN
60.0000 mg | ORAL | Status: DC
  Administered 2022-05-31 – 2022-06-14 (×81): 60 mg
  Filled 2022-05-31 (×81): qty 10

## 2022-05-31 MED ORDER — PROPOFOL 500 MG/50ML IV EMUL
INTRAVENOUS | Status: DC | PRN
  Administered 2022-05-31: 25 ug/kg/min via INTRAVENOUS

## 2022-05-31 MED ORDER — SODIUM CHLORIDE 0.9 % IV SOLN: 10.0000 mL/h | Freq: Once | INTRAVENOUS | Status: DC

## 2022-05-31 MED ORDER — PROPOFOL 10 MG/ML IV BOLUS
INTRAVENOUS | Status: DC | PRN
  Administered 2022-05-31: 50 mg via INTRAVENOUS
  Administered 2022-05-31: 30 mg via INTRAVENOUS
  Administered 2022-05-31: 20 mg via INTRAVENOUS
  Administered 2022-05-31: 200 mg via INTRAVENOUS
  Administered 2022-05-31: 30 mg via INTRAVENOUS
  Administered 2022-05-31: 50 mg via INTRAVENOUS

## 2022-05-31 MED ORDER — CHLORHEXIDINE GLUCONATE CLOTH 2 % EX PADS: 6.0000 | MEDICATED_PAD | Freq: Once | CUTANEOUS | Status: DC

## 2022-05-31 MED ORDER — FUROSEMIDE 10 MG/ML IJ SOLN
INTRAMUSCULAR | Status: AC
  Filled 2022-05-31: qty 4

## 2022-05-31 MED ORDER — THROMBIN 20000 UNITS EX SOLR
CUTANEOUS | Status: DC | PRN
  Administered 2022-05-31: 20 mL via TOPICAL

## 2022-05-31 MED ORDER — NICARDIPINE HCL IN NACL 20-0.86 MG/200ML-% IV SOLN
3.0000 mg/h | INTRAVENOUS | Status: DC
  Administered 2022-05-31: 3 mg/h via INTRAVENOUS
  Administered 2022-05-31 (×2): 10 mg/h via INTRAVENOUS
  Administered 2022-05-31: 7.5 mg/h via INTRAVENOUS
  Filled 2022-05-31: qty 400
  Filled 2022-05-31 (×3): qty 200

## 2022-05-31 MED ORDER — AMLODIPINE BESYLATE 10 MG PO TABS
10.0000 mg | ORAL_TABLET | Freq: Every day | ORAL | Status: DC
  Administered 2022-05-31: 10 mg
  Filled 2022-05-31: qty 1

## 2022-05-31 MED ORDER — CEFAZOLIN SODIUM-DEXTROSE 2-4 GM/100ML-% IV SOLN
2.0000 g | INTRAVENOUS | Status: AC
  Administered 2022-05-31: 2 g via INTRAVENOUS

## 2022-05-31 MED ORDER — POLYETHYLENE GLYCOL 3350 17 G PO PACK
17.0000 g | PACK | Freq: Every day | ORAL | Status: DC
  Administered 2022-05-31 – 2022-06-02 (×3): 17 g
  Filled 2022-05-31 (×3): qty 1

## 2022-05-31 MED ORDER — LIDOCAINE HCL (PF) 1 % IJ SOLN
INTRAMUSCULAR | Status: AC
  Filled 2022-05-31: qty 30

## 2022-05-31 MED ORDER — ACETAMINOPHEN 160 MG/5ML PO SOLN
650.0000 mg | ORAL | Status: DC | PRN
  Administered 2022-06-05 – 2022-06-20 (×25): 650 mg
  Filled 2022-05-31 (×26): qty 20.3

## 2022-05-31 MED ORDER — MORPHINE SULFATE (PF) 2 MG/ML IV SOLN: 1.0000 mg | INTRAVENOUS | Status: DC | PRN

## 2022-05-31 MED ORDER — SENNOSIDES-DOCUSATE SODIUM 8.6-50 MG PO TABS
1.0000 | ORAL_TABLET | Freq: Two times a day (BID) | ORAL | Status: DC
  Administered 2022-05-31 – 2022-06-03 (×6): 1
  Filled 2022-05-31 (×6): qty 1

## 2022-05-31 MED ORDER — ONDANSETRON HCL 4 MG/2ML IJ SOLN
INTRAMUSCULAR | Status: AC
  Filled 2022-05-31: qty 2

## 2022-05-31 MED ORDER — ORAL CARE MOUTH RINSE: 15.0000 mL | OROMUCOSAL | Status: DC | PRN

## 2022-05-31 MED ORDER — THROMBIN 5000 UNITS EX SOLR
CUTANEOUS | Status: AC
  Filled 2022-05-31: qty 5000

## 2022-05-31 MED ORDER — FENTANYL CITRATE PF 50 MCG/ML IJ SOSY
50.0000 ug | PREFILLED_SYRINGE | INTRAMUSCULAR | Status: DC | PRN
  Administered 2022-05-31 (×2): 50 ug via INTRAVENOUS
  Filled 2022-05-31 (×2): qty 1

## 2022-05-31 MED ORDER — BACITRACIN ZINC 500 UNIT/GM EX OINT
TOPICAL_OINTMENT | CUTANEOUS | Status: DC | PRN
  Administered 2022-05-31: 1 via TOPICAL

## 2022-05-31 MED ORDER — SODIUM CHLORIDE 0.9 % IV SOLN
10.0000 mL/h | Freq: Once | INTRAVENOUS | Status: AC
  Administered 2022-05-31: 10 mL/h via INTRAVENOUS

## 2022-05-31 MED ORDER — HEMOSTATIC AGENTS (NO CHARGE) OPTIME
TOPICAL | Status: DC | PRN
  Administered 2022-05-31: 1 via TOPICAL

## 2022-05-31 MED ORDER — ESMOLOL HCL 100 MG/10ML IV SOLN: INTRAVENOUS | Status: DC | PRN

## 2022-05-31 MED ORDER — FENTANYL CITRATE (PF) 250 MCG/5ML IJ SOLN
INTRAMUSCULAR | Status: DC | PRN
  Administered 2022-05-31: 50 ug via INTRAVENOUS
  Administered 2022-05-31: 100 ug via INTRAVENOUS
  Administered 2022-05-31: 150 ug via INTRAVENOUS
  Administered 2022-05-31 (×2): 50 ug via INTRAVENOUS
  Administered 2022-05-31: 100 ug via INTRAVENOUS

## 2022-05-31 MED ORDER — TRIAMTERENE-HCTZ 37.5-25 MG PO TABS
1.0000 | ORAL_TABLET | Freq: Every day | ORAL | Status: DC
  Administered 2022-05-31 – 2022-06-07 (×8): 1
  Filled 2022-05-31 (×9): qty 1

## 2022-05-31 MED ORDER — DEXMEDETOMIDINE HCL IN NACL 80 MCG/20ML IV SOLN
INTRAVENOUS | Status: AC
  Filled 2022-05-31: qty 20

## 2022-05-31 MED ORDER — BUPIVACAINE HCL 0.5 % IJ SOLN
INTRAMUSCULAR | Status: DC | PRN
  Administered 2022-05-31: 4 mL
  Administered 2022-05-31: 5 mL

## 2022-05-31 MED ORDER — ESMOLOL HCL-SODIUM CHLORIDE 2000 MG/100ML IV SOLN
INTRAVENOUS | Status: DC | PRN
  Administered 2022-05-31: 20 mg via INTRAVENOUS

## 2022-05-31 MED ORDER — AMLODIPINE BESYLATE 10 MG PO TABS: 10.0000 mg | ORAL_TABLET | Freq: Every day | ORAL | Status: DC

## 2022-05-31 MED ORDER — ROCURONIUM BROMIDE 10 MG/ML (PF) SYRINGE
PREFILLED_SYRINGE | INTRAVENOUS | Status: DC | PRN
  Administered 2022-05-31: 60 mg via INTRAVENOUS
  Administered 2022-05-31: 30 mg via INTRAVENOUS
  Administered 2022-05-31: 40 mg via INTRAVENOUS
  Administered 2022-05-31: 20 mg via INTRAVENOUS
  Administered 2022-05-31: 30 mg via INTRAVENOUS
  Administered 2022-05-31: 20 mg via INTRAVENOUS

## 2022-05-31 MED ORDER — ACETAMINOPHEN 325 MG PO TABS
650.0000 mg | ORAL_TABLET | ORAL | Status: DC | PRN
  Administered 2022-06-17 – 2022-06-21 (×3): 650 mg via ORAL
  Filled 2022-05-31 (×3): qty 2

## 2022-05-31 MED ORDER — THROMBIN 5000 UNITS EX SOLR
OROMUCOSAL | Status: DC | PRN
  Administered 2022-05-31: 5 mL via TOPICAL

## 2022-05-31 MED ORDER — DEXAMETHASONE SODIUM PHOSPHATE 10 MG/ML IJ SOLN
INTRAMUSCULAR | Status: DC | PRN
  Administered 2022-05-31: 10 mg via INTRAVENOUS

## 2022-05-31 MED ORDER — CHLORHEXIDINE GLUCONATE CLOTH 2 % EX PADS
6.0000 | MEDICATED_PAD | Freq: Once | CUTANEOUS | Status: DC
  Administered 2022-05-31: 6 via TOPICAL

## 2022-05-31 MED ORDER — ONDANSETRON HCL 4 MG/2ML IJ SOLN
INTRAMUSCULAR | Status: DC | PRN
  Administered 2022-05-31: 4 mg via INTRAVENOUS

## 2022-05-31 MED ORDER — LEVETIRACETAM IN NACL 1000 MG/100ML IV SOLN
1000.0000 mg | Freq: Once | INTRAVENOUS | Status: AC
  Administered 2022-05-31: 1000 mg via INTRAVENOUS
  Filled 2022-05-31: qty 100

## 2022-05-31 SURGICAL SUPPLY — 109 items
APL SKNCLS STERI-STRIP NONHPOA (GAUZE/BANDAGES/DRESSINGS)
BAG COUNTER SPONGE SURGICOUNT (BAG) ×1 IMPLANT
BAG SPNG CNTER NS LX DISP (BAG) ×1
BAND INSRT 18 STRL LF DISP RB (MISCELLANEOUS) ×2
BAND RUBBER #18 3X1/16 STRL (MISCELLANEOUS) ×2 IMPLANT
BENZOIN TINCTURE PRP APPL 2/3 (GAUZE/BANDAGES/DRESSINGS) IMPLANT
BIT DRILL WIRE PASS 1.3MM (BIT) IMPLANT
BLADE SAW GIGLI 16 STRL (MISCELLANEOUS) IMPLANT
BLADE SURG 15 STRL LF DISP TIS (BLADE) ×1 IMPLANT
BLADE SURG 15 STRL SS (BLADE) ×1
BNDG CMPR 75X41 PLY HI ABS (GAUZE/BANDAGES/DRESSINGS) ×1
BNDG GAUZE DERMACEA FLUFF 4 (GAUZE/BANDAGES/DRESSINGS) IMPLANT
BNDG GZE DERMACEA 4 6PLY (GAUZE/BANDAGES/DRESSINGS) ×4
BNDG STRETCH 4X75 STRL LF (GAUZE/BANDAGES/DRESSINGS) IMPLANT
BUR ACORN 6.0 PRECISION (BURR) IMPLANT
BUR MATCHSTICK NEURO 3.0 LAGG (BURR) IMPLANT
BUR ROUND FLUTED 4 SOFT TCH (BURR) IMPLANT
BUR ROUND FLUTED 5 RND (BURR) IMPLANT
BUR SPIRAL ROUTER 2.3 (BUR) IMPLANT
CABLE BIPOLOR RESECTION CORD (MISCELLANEOUS) IMPLANT
CANISTER SUCT 3000ML PPV (MISCELLANEOUS) ×1 IMPLANT
CLIP ANEURY TI PERM MINI STR 5 (Clip) IMPLANT
CLIP ANEURY TI TEMP MINI STR 7 (Clip) IMPLANT
CLIP TI MEDIUM 6 (CLIP) IMPLANT
COVER MAYO STAND STRL (DRAPES) IMPLANT
DRAPE INCISE IOBAN 66X45 STRL (DRAPES) IMPLANT
DRAPE LAPAROTOMY 100X72X124 (DRAPES) IMPLANT
DRAPE MICROSCOPE SLANT 54X150 (MISCELLANEOUS) ×1 IMPLANT
DRAPE NEUROLOGICAL W/INCISE (DRAPES) ×1 IMPLANT
DRAPE WARM FLUID 44X44 (DRAPES) ×1 IMPLANT
DRILL WIRE PASS 1.3MM (BIT)
DRSG ADAPTIC 3X8 NADH LF (GAUZE/BANDAGES/DRESSINGS) ×1 IMPLANT
DRSG OPSITE POSTOP 4X6 (GAUZE/BANDAGES/DRESSINGS) IMPLANT
DRSG TELFA 3X8 NADH STRL (GAUZE/BANDAGES/DRESSINGS) ×1 IMPLANT
DURAGUARD 04CMX04CM IMPLANT
DURAGUARD 06CMX08CM IMPLANT
DURAPREP 6ML APPLICATOR 50/CS (WOUND CARE) ×1 IMPLANT
ELECT BLADE 4.0 EZ CLEAN MEGAD (MISCELLANEOUS) ×1
ELECT REM PT RETURN 9FT ADLT (ELECTROSURGICAL) ×1
ELECTRODE BLDE 4.0 EZ CLN MEGD (MISCELLANEOUS) IMPLANT
ELECTRODE REM PT RTRN 9FT ADLT (ELECTROSURGICAL) ×1 IMPLANT
EVACUATOR SILICONE 100CC (DRAIN) IMPLANT
FORCEPS BIPOLAR SPETZLER 8 1.0 (NEUROSURGERY SUPPLIES) IMPLANT
GAUZE 4X4 16PLY ~~LOC~~+RFID DBL (SPONGE) IMPLANT
GAUZE SPONGE 4X4 12PLY STRL (GAUZE/BANDAGES/DRESSINGS) IMPLANT
GLOVE BIO SURGEON STRL SZ7.5 (GLOVE) IMPLANT
GLOVE BIOGEL PI IND STRL 7.5 (GLOVE) ×2 IMPLANT
GLOVE BIOGEL PI IND STRL 8 (GLOVE) IMPLANT
GLOVE ECLIPSE 7.0 STRL STRAW (GLOVE) ×2 IMPLANT
GLOVE SS PI 9.0 STRL (GLOVE) IMPLANT
GLOVE SURG SS PI 7.0 STRL IVOR (GLOVE) IMPLANT
GLOVE SURG SS PI 8.0 STRL IVOR (GLOVE) IMPLANT
GOWN STRL REUS W/ TWL LRG LVL3 (GOWN DISPOSABLE) ×2 IMPLANT
GOWN STRL REUS W/ TWL XL LVL3 (GOWN DISPOSABLE) IMPLANT
GOWN STRL REUS W/TWL 2XL LVL3 (GOWN DISPOSABLE) IMPLANT
GOWN STRL REUS W/TWL LRG LVL3 (GOWN DISPOSABLE) ×2
GOWN STRL REUS W/TWL XL LVL3 (GOWN DISPOSABLE) ×2
GRAFT DURAGEN MATRIX 3WX3L (Graft) ×1 IMPLANT
GRAFT DURAGEN MATRIX 3X3 SNGL (Graft) IMPLANT
HEMOSTAT POWDER KIT SURGIFOAM (HEMOSTASIS) IMPLANT
HEMOSTAT SURGICEL 2X14 (HEMOSTASIS) ×1 IMPLANT
HOOK DURA (MISCELLANEOUS) ×1 IMPLANT
HOOK DURA 1/2IN (MISCELLANEOUS) ×1 IMPLANT
KIT BASIN OR (CUSTOM PROCEDURE TRAY) ×1 IMPLANT
KIT DRAIN CSF ACCUDRAIN (MISCELLANEOUS) IMPLANT
KIT TURNOVER KIT B (KITS) ×1 IMPLANT
KNIFE ARACHNOID DISP AM-24-S (MISCELLANEOUS) IMPLANT
NDL HYPO 25X1 1.5 SAFETY (NEEDLE) ×1 IMPLANT
NDL SPNL 25GX3.5 QUINCKE BL (NEEDLE) IMPLANT
NEEDLE HYPO 22GX1.5 SAFETY (NEEDLE) IMPLANT
NEEDLE HYPO 25X1 1.5 SAFETY (NEEDLE) ×1 IMPLANT
NEEDLE SPNL 25GX3.5 QUINCKE BL (NEEDLE) IMPLANT
NS IRRIG 1000ML POUR BTL (IV SOLUTION) ×1 IMPLANT
PACK CRANIOTOMY CUSTOM (CUSTOM PROCEDURE TRAY) ×1 IMPLANT
PAD ARMBOARD 7.5X6 YLW CONV (MISCELLANEOUS) ×1 IMPLANT
PATTIES SURGICAL .25X.25 (GAUZE/BANDAGES/DRESSINGS) IMPLANT
PATTIES SURGICAL .5 X.5 (GAUZE/BANDAGES/DRESSINGS) IMPLANT
PATTIES SURGICAL .5 X3 (DISPOSABLE) IMPLANT
PATTIES SURGICAL 1/4 X 3 (GAUZE/BANDAGES/DRESSINGS) IMPLANT
PATTIES SURGICAL 1X1 (DISPOSABLE) IMPLANT
PENCIL BUTTON HOLSTER BLD 10FT (ELECTRODE) IMPLANT
PERFORATOR LRG  14-11MM (BIT)
PERFORATOR LRG 14-11MM (BIT) IMPLANT
PIN MAYFIELD SKULL DISP (PIN) IMPLANT
SOL ELECTROSURG ANTI STICK (MISCELLANEOUS) ×1
SOLUTION ELECTROSURG ANTI STCK (MISCELLANEOUS) ×1 IMPLANT
SPIKE FLUID TRANSFER (MISCELLANEOUS) ×1 IMPLANT
SPONGE NEURO XRAY DETECT 1X3 (DISPOSABLE) IMPLANT
SPONGE SURGIFOAM ABS GEL 100 (HEMOSTASIS) ×1 IMPLANT
STAPLER VISISTAT 35W (STAPLE) ×1 IMPLANT
STOCKINETTE 6  STRL (DRAPES) ×1
STOCKINETTE 6 STRL (DRAPES) ×1 IMPLANT
SUT ETHILON 3 0 FSL (SUTURE) IMPLANT
SUT NURALON 4 0 TR CR/8 (SUTURE) ×2 IMPLANT
SUT VIC AB 0 CT1 18XCR BRD8 (SUTURE) ×2 IMPLANT
SUT VIC AB 0 CT1 8-18 (SUTURE) ×2
SUT VIC AB 3-0 SH 8-18 (SUTURE) ×1 IMPLANT
TAPE CLOTH 1X10 TAN NS (GAUZE/BANDAGES/DRESSINGS) ×1 IMPLANT
TIP NONSTICK .5MMX23CM (INSTRUMENTS)
TIP NONSTICK .5X23 (INSTRUMENTS) IMPLANT
TOWEL GREEN STERILE (TOWEL DISPOSABLE) ×1 IMPLANT
TOWEL GREEN STERILE FF (TOWEL DISPOSABLE) ×1 IMPLANT
TRAY FOL W/BAG SLVR 16FR STRL (SET/KITS/TRAYS/PACK) IMPLANT
TRAY FOLEY MTR SLVR 16FR STAT (SET/KITS/TRAYS/PACK) IMPLANT
TRAY FOLEY W/BAG SLVR 16FR LF (SET/KITS/TRAYS/PACK) ×1
TUBE CONNECTING 12X1/4 (SUCTIONS) IMPLANT
TUBING FEATHERFLOW (TUBING) IMPLANT
UNDERPAD 30X36 HEAVY ABSORB (UNDERPADS AND DIAPERS) IMPLANT
WATER STERILE IRR 1000ML POUR (IV SOLUTION) ×1 IMPLANT

## 2022-05-31 NOTE — Progress Notes (Signed)
Dr. Sandford Craze made aware of patient's elevated BP readings this am. No new orders received.

## 2022-05-31 NOTE — Op Note (Signed)
NEUROSURGERY OPERATIVE NOTE   PREOP DIAGNOSIS:  Right Middle Cerebral Artery Aneurysm   POSTOP DIAGNOSIS:  Subarachnoid Hemorrhage Right Middle Cerebral Artery Aneurysm  PROCEDURE: Right pterional craniectomy for clipping of middle cerebral artery aneurysm, complex due to intraoperative rupture and need for temporary MCA occlusion Placement of bone flap in subcutaneous abdominal pocket Use of intraoperative microscope for microdissection Use of intraoperative ICG video angiography  SURGEON: Dr. Lisbeth Renshaw, MD  ASSISTANT: Dr. Julio Sicks, MD  ANESTHESIA: General Endotracheal  EBL: 250cc  SPECIMENS: None  DRAINS: None  COMPLICATIONS: Intraoperative aneurysm rupture prior to exposure  CONDITION: Hemodynamically stable, intubated to ICU  HISTORY: Kristin Simmons is a 37 y.o. 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  Patient further underwent diagnostic cerebral angiogram and coil embolization of a large right ophthalmic aneurysm.  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  This was not felt to be amenable to endovascular coil embolization and given her young age and morphology of this aneurysm, surgical clipping was recommended.  The risks, benefits, and alternatives to surgery were all reviewed in detail with the patient.  After all her questions were answered informed consent was obtained and witnessed.  PROCEDURE IN DETAIL: The patient was brought to the operating room. After induction of general anesthesia, the patient was positioned on the operative table in the [place] head holder in the supine position. All pressure points were meticulously padded.  Right-sided curvilinear frontotemporal skin incision was then marked out and prepped and draped in the usual sterile fashion.  After timeout was conducted, skin incision was  infiltrated with local anesthetic without epinephrine.  Incision was then made sharply and carried down through the galea.  Raney clips were applied.  Bovie electrocautery was used to dissect through the periosteum as well as the superficial temporal fascia and the temporalis muscle.  Single piece myocutaneous flap was then elevated and reflected anteriorly.  High-speed drill was used to create a bur hole along the pterion, superior temporal line, and just above the root of the zygoma.  A standard frontotemporal craniotomy flap was then elevated.  The dura was noted to be somewhat tense, and the anesthesia service was asked to administer 50 g dose of mannitol.  High-speed drill was used to drill down the lesser wing of the sphenoid in order to obtain unobstructed access to the optical carotid cistern.  Hemostasis on the epidural plane and the bone edges was easily secured.  At this point the dura was opened sharply and reflected anteriorly.  I immediately noted a significant amount of subarachnoid hemorrhage surrounding the sylvian fissure, more than would be expected from local trauma during drilling of the bur hole.  Over the course of approximately 2 minutes, the subarachnoid hemorrhage became significantly worse, associated with drastic brain edema and herniation of the brain through the craniotomy defect.  The anesthesia service was asked to administer another dose of 50 g of mannitol presuming intraoperative aneurysm rupture.  The microscope was brought into the field, and a subfrontal approach was employed to rapidly identify the optic nerve and the optical carotid cistern was opened.  I was able to identify the supraclinoid internal carotid artery as well as the previously coiled ophthalmic aneurysm.  There was arterial bleeding throughout the cisterns, although it did appear that the arterial hemorrhage was emanating from the proximal sylvian fissure, suggesting rupture of the middle cerebral  artery  aneurysm rather than the previously coiled ophthalmic aneurysm.  The retractor system was used to gently retract the temporal lobe and the frontal lobe to expose the sylvian fissure.  The sylvian fissure was then dissected in a distal to proximal fashion to identify initially the distal M2 segments.  These were traced back towards the medial fissure and I was able to identify the M1 segment and the ICA bifurcation.  A location for temporary occlusion of the M1 for proximal control was identified and a temporary clip was placed.  The anesthesia service was concurrently asked to administer a bolus dose of propofol to induce burst suppression in order to decrease CMR O2.  I was then able to dissect around the right middle cerebral artery bifurcation and identify the relevant vessels including the right M1, both M2 segments, the anterior temporal branch, and the irregular, bilobed anterior temporal aneurysm.  The bipolar was used to coagulate a portion of the medial temporal lobe in order to fully dissect the dome and identify the margins of the aneurysm neck.  A mini straight titanium clip was then selected and placed across the neck of the aneurysm taking care to preserve the M2 segments and the anterior temporal branch.  Once this was done, the temporary clip was removed from the M1 segment.  No active bleeding was identified.  This point, the anesthesia service was asked to give a dose of 12.5 mg of ICG.  Intraoperative fluorescence video angiography was conducted which revealed patency of the internal carotid artery, M1 segment, anterior temporal branch, and both M2 segments.  There did not appear to be any filling of the ophthalmic aneurysm neck nor was there any filling of the clipped middle cerebral artery aneurysm.  The table mounted retractors were then removed.  No active bleeding within the subarachnoid space or brain surface was identified.  While the brain swelling appeared to be somewhat improved,  there was still significant herniation of the brain to the level of the outer table of the skull.  I therefore elected to leave the bone flap off.  The dura was flapped over the brain surface.  A layer of DuraGen followed by a layer of dura guard was placed.  The skin was then replaced and the temporalis muscle and fascia was closed with interrupted 0 Vicryl stitches.  Donnetta Hutching was closed with interrupted 0 and 3-0 Vicryl stitches.  The skin was then closed with staples.  While the back table was Sterile, the drapes were removed and sterile dressing was applied.  The skin of the abdomen was then prepped and draped for placement of the bone flap subcutaneously.  A linear right-sided incision was infiltrated with local anesthetic.  The incision was then made sharply and carried down through the subcutaneous tissue until the fat notified.  The skin was then undermined and the bone flap was placed.  The skin was then closed in multiple layers using interrupted 0 and 3-0 Vicryl stitches.  Skin was closed with staples.  Bacitracin ointment and sterile dressing was applied.  At the end of the case all sponge, needle, instrument, and cottonoid counts were correct.  After discussion with the anesthesia service, we elected to keep the patient intubated and take her to the intensive care unit.  She remained stable from a hemodynamic standpoint.  Her pupils were 2 mm bilaterally at the end of surgery.   Lisbeth Renshaw, MD [facility]

## 2022-05-31 NOTE — Anesthesia Procedure Notes (Addendum)
Procedure Name: Intubation Date/Time: 05/31/2022 8:12 AM  Performed by: Marny Lowenstein, CRNAPre-anesthesia Checklist: Patient identified, Emergency Drugs available, Suction available and Patient being monitored Patient Re-evaluated:Patient Re-evaluated prior to induction Oxygen Delivery Method: Circle System Utilized Preoxygenation: Pre-oxygenation with 100% oxygen Induction Type: IV induction Ventilation: Mask ventilation without difficulty Laryngoscope Size: Miller and 2 Grade View: Grade I Tube type: Oral Tube size: 7.0 mm Number of attempts: 1 Airway Equipment and Method: Stylet and Oral airway Placement Confirmation: ETT inserted through vocal cords under direct vision, positive ETCO2 and breath sounds checked- equal and bilateral Secured at: 23 cm Tube secured with: Tape Dental Injury: Teeth and Oropharynx as per pre-operative assessment

## 2022-05-31 NOTE — Consult Note (Signed)
NAME:  Kristin Simmons, MRN:  [id number], DOB:  1985/11/08, LOS: 0 ADMISSION DATE:  05/31/2022, CONSULTATION DATE:  4/16 REFERRING MD:  Dr. Conchita Paris, CHIEF COMPLAINT:  R MCA aneurysm s/p crani w/ clipping   History of Present Illness:  36 year old female who underwent elective repair of right MCA artery aneurysm not amenable to endovascular treatment.  This was complicated by intraoperative aneurysm rupture and need for right craniectomy and temporary right MCA occlusion to enable clipping.  She received 100 g of mannitol in 2 divided doses during the procedure Head CT showed right frontal and temporal lobe edema with subarachnoid hemorrhage, decreased along the right lower sylvian fissure She was brought to the ICU sedated on high-dose propofol and intubated  Pertinent  Medical History  Status post coil embolization of right ophthalmic aneurysm Hypertension  Past Medical History:  Diagnosis Date   Anemia    Anxiety    Arthritis    Asthma    Cancer    cervical   Depression    Headache    Hypertension    She was taking her mother's blood pressure meds but states she doesn't need bp meds now.   Vaginal Pap smear, abnormal      Significant Hospital Events: Including procedures, antibiotic start and stop dates in addition to other pertinent events     Interim History / Subjective:  Critically ill, intubated and sedated, no distress  Objective   Blood pressure (!) 144/104, pulse 87, temperature 98.4 F (36.9 C), temperature source Oral, resp. rate 20, height  (1.626 m), weight 52.2 kg, last menstrual period 04/26/2022, SpO2 99 %.        Intake/Output Summary (Last 24 hours) at 05/31/2022 1224 Last data filed at 05/31/2022 1130 Gross per 24 hour  Intake 2130 ml  Output 1650 ml  Net 480 ml   Filed Weights   05/31/22 0542  Weight: 52.2 kg    Examination: General: Young woman, sedated, no distress HENT: Pinpoint pupils, oral ETT Lungs: Clear to auscultation, no  accessory muscle use Cardiovascular: S1-S2 regular, bradycardia 60s Abdomen: Soft nontender abdomen Extremities: No edema Neuro: Sedated, RASS -4,  Beta-hCG negative   Resolved Hospital Problem list     Assessment & Plan:  R MCA aneurysm s/p crani w/ clipping -Recent coil embolization on 02/2022 for right ophthalmic aneurysm P: -post op care per NSG -SBP goal less than 160  Post op vent mgt Plan: -check cxr to confirm ETT position -LTVV strategy with tidal volumes of 6-8 cc/kg ideal body weight -check ABG and adjust settings accordingly  -Wean PEEP/FiO2 for SpO2 >95% -VAP bundle in place -Daily SAT and SBT -PAD protocol in place -propofol and intermittent fentanyl for goal RASS -2     Best Practice (right click and "Reselect all SmartList Selections" daily)   Diet/type: NPO w/ meds via tube DVT prophylaxis: SCD GI prophylaxis: PPI Lines: N/A Foley:  N/A Code Status:  full code Last date of multidisciplinary goals of care discussion [NA]  Labs   CBC: No results for input(s): "WBC", "NEUTROABS", "HGB", "HCT", "MCV", "PLT" in the last 168 hours.  Basic Metabolic Panel: No results for input(s): "NA", "K", "CL", "CO2", "GLUCOSE", "BUN", "CREATININE", "CALCIUM", "MG", "PHOS" in the last 168 hours. GFR: Estimated Creatinine Clearance: 81.7 mL/min (by C-G formula based on SCr of 0.74 mg/dL). No results for input(s): "PROCALCITON", "WBC", "LATICACIDVEN" in the last 168 hours.  Liver Function Tests: No results for input(s): "AST", "ALT", "ALKPHOS", "BILITOT", "PROT", "ALBUMIN" in  the last 168 hours. No results for input(s): "LIPASE", "AMYLASE" in the last 168 hours. No results for input(s): "AMMONIA" in the last 168 hours.  ABG    Component Value Date/Time   TCO2 29 01/31/2022 1459     Coagulation Profile: No results for input(s): "INR", "PROTIME" in the last 168 hours.  Cardiac Enzymes: No results for input(s): "CKTOTAL", "CKMB", "CKMBINDEX", "TROPONINI" in  the last 168 hours.  HbA1C: No results found for: "HGBA1C"  CBG: No results for input(s): "GLUCAP" in the last 168 hours.  Review of Systems:   Unable to obtain since intubated and sedated  Past Medical History:  She,  has a past medical history of Anemia, Anxiety, Arthritis, Asthma, Cancer, Depression, Headache, Hypertension, and Vaginal Pap smear, abnormal.   Surgical History:   Past Surgical History:  Procedure Laterality Date   IR 3D INDEPENDENT WKST  03/08/2022   IR ANGIO INTRA EXTRACRAN SEL INTERNAL CAROTID BILAT MOD SED  03/08/2022   IR ANGIO VERTEBRAL SEL VERTEBRAL BILAT MOD SED  03/08/2022   IR ANGIOGRAM FOLLOW UP STUDY  03/08/2022   IR ANGIOGRAM FOLLOW UP STUDY  03/08/2022   IR ANGIOGRAM FOLLOW UP STUDY  03/08/2022   IR NEURO EACH ADD'L AFTER BASIC UNI RIGHT (MS)  03/08/2022   IR TRANSCATH/EMBOLIZ  03/08/2022   NO PAST SURGERIES     RADIOLOGY WITH ANESTHESIA N/A 03/08/2022   Procedure: Diagnostic angiogram, Possible Coil Embolization of Aneurysm;  Surgeon: Lisbeth Renshaw, MD;  Location: [facility] OR;  Service: Radiology;  Laterality: N/A;   TUBAL LIGATION Bilateral 03/27/2018   Procedure: POST PARTUM TUBAL LIGATION;  Surgeon: Levie Heritage, DO;  Location: [facility];  Service: Gynecology;  Laterality: Bilateral;     Social History:   reports that she has been smoking cigarettes. She has a 7.50 pack-year smoking history. She has never used smokeless tobacco. She reports current alcohol use of about 14.0 standard drinks of alcohol per week. She reports that she does not use drugs.   Family History:  Her family history includes Alzheimer's disease in her paternal grandmother; Anemia in her mother; Asthma in her daughter, daughter, and daughter; Bronchitis in her daughter, daughter, and daughter; Cancer in her maternal grandfather and maternal grandmother; Diabetes in her sister; Hypertension in her father, mother, and sister; Mental illness in her brother; Thyroid disease in  her mother.   Allergies Allergies  Allergen Reactions   Latex Itching and Rash     Home Medications  Prior to Admission medications   Medication Sig Start Date End Date Taking? Authorizing Provider  acetaminophen (TYLENOL) 325 MG tablet Take 2 tablets (650 mg total) by mouth every 4 (four) hours as needed for mild pain (temp > 100.5). 03/09/22  Yes Lisbeth Renshaw, MD  amLODipine (NORVASC) 10 MG tablet Take 1 tablet (10 mg total) by mouth daily. 03/14/21  Yes Pricilla Loveless, MD  naproxen sodium (ALEVE) 220 MG tablet Take 220 mg by mouth daily as needed (in the AM).   Yes [provider]  triamterene-hydrochlorothiazide (MAXZIDE-25) 37.5-25 MG tablet Take 1 tablet by mouth daily. 03/14/21  Yes Pricilla Loveless, MD     Critical care time: 40 minutes    Comer Locket. Vassie Loll MD [facility] 05/31/2022, 12:25 PM  Please see [email or web address] for pager details.  From 7A-7P if no response, please call [phone]. After hours, please call ELink [phone].

## 2022-05-31 NOTE — Transfer of Care (Signed)
Immediate Anesthesia Transfer of Care Note  Patient: Kristin Simmons  Procedure(s) Performed: RIGHT PTERIONAL CRANIOTOMY FOR CLIPPING OF MIDDLE CEREBRAL ARTERY  ANEURYSM WITH PLACEMENT OF BONEFLAP IN ABDOMEN (Right)  Patient Location: ICU  Anesthesia Type:General  Level of Consciousness: Patient remains intubated per anesthesia plan  Airway & Oxygen Therapy: Patient remains intubated per anesthesia plan and Patient placed on Ventilator (see vital sign flow sheet for setting)  Post-op Assessment: Report given to RN and Post -op Vital signs reviewed and stable  Post vital signs: Reviewed and stable  Last Vitals:  Vitals Value Taken Time  BP 133/101 05/31/22 1220  Temp    Pulse 60 05/31/22 1241  Resp 18 05/31/22 1241  SpO2 100 % 05/31/22 1241  Vitals shown include unvalidated device data.  Last Pain:  Vitals:   05/31/22 0559  TempSrc:   PainSc: 0-No pain      Patients Stated Pain Goal: 0 (05/31/22 0559)  Complications: No notable events documented.

## 2022-05-31 NOTE — Anesthesia Procedure Notes (Signed)
Arterial Line Insertion Start/End4/16/2024 7:25 AM, 05/31/2022 7:30 AM Performed by: Jairo Ben, MD, Marny Lowenstein, CRNA, CRNA  Patient location: PACU. Preanesthetic checklist: patient identified, IV checked, site marked, risks and benefits discussed, surgical consent, monitors and equipment checked, pre-op evaluation and timeout performed Lidocaine 1% used for infiltration and patient sedated Left, radial was placed Catheter size: 20 G Hand hygiene performed  and maximum sterile barriers used   Attempts: 3 Procedure performed without using ultrasound guided technique. Following insertion, Biopatch and dressing applied. Post procedure assessment: normal  Post procedure complications: unsuccessful attempts. Patient tolerated the procedure well with no immediate complications.

## 2022-05-31 NOTE — H&P (Signed)
Chief Complaint   Aneurysm  History of Present Illness  Kristin Simmons is a 37 year old woman seen for initial consultation as a referral from recent trips to the emergency department. At the time the patient was having some dental pain. As part of her workup in the ED she underwent maxillofacial CT with contrast which incidentally discovered a right carotid aneurysm. She was therefore referred for neurosurgical evaluation. Upon questioning, the patient has dental pain has largely improved. She does not report any chronic history of headaches or visual changes. No new numbness tingling or weakness. 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  Of note, the patient does have a history of hypertension, and admits poor medication compliance. No history of diabetes, heart disease, previous stroke, or lung, liver, or kidney disease. She is not on any blood thinners or anti-platelet agents. She is a active smoker. There is a history of ruptured brain aneurysm in her paternal aunt   Past Medical History   Past Medical History:  Diagnosis Date   Anemia    Anxiety    Arthritis    Asthma    Cancer    cervical   Depression    Headache    Hypertension    She was taking her mother's blood pressure meds but states she doesn't need bp meds now.   Vaginal Pap smear, abnormal     Past Surgical History   Past Surgical History:  Procedure Laterality Date   IR 3D INDEPENDENT WKST  03/08/2022   IR ANGIO INTRA EXTRACRAN SEL INTERNAL CAROTID BILAT MOD SED  03/08/2022   IR ANGIO VERTEBRAL SEL VERTEBRAL BILAT MOD SED  03/08/2022   IR ANGIOGRAM FOLLOW UP STUDY  03/08/2022   IR ANGIOGRAM FOLLOW UP STUDY  03/08/2022   IR ANGIOGRAM FOLLOW UP STUDY  03/08/2022   IR NEURO EACH ADD'L AFTER BASIC UNI  RIGHT (MS)  03/08/2022   IR TRANSCATH/EMBOLIZ  03/08/2022   NO PAST SURGERIES     RADIOLOGY WITH ANESTHESIA N/A 03/08/2022   Procedure: Diagnostic angiogram, Possible Coil Embolization of Aneurysm;  Surgeon: Lisbeth Renshaw, MD;  Location: [facility] OR;  Service: Radiology;  Laterality: N/A;   TUBAL LIGATION Bilateral 03/27/2018   Procedure: POST PARTUM TUBAL LIGATION;  Surgeon: Levie Heritage, DO;  Location: [facility];  Service: Gynecology;  Laterality: Bilateral;    Social History   Social History   Tobacco Use   Smoking status: Every Day    Packs/day: 0.50    Years: 15.00    Additional pack years: 0.00    Total pack years: 7.50    Types: Cigarettes   Smokeless tobacco: Never  Vaping Use   Vaping Use: Every day   Substances: Nicotine  Substance Use Topics   Alcohol use: Yes    Alcohol/week: 14.0 standard drinks of alcohol    Types: 14 Cans of beer per week    Comment: 2 beers daily   Drug use: No    Types: Marijuana    Comment: last use 2007    Medications   Prior to Admission medications   Medication Sig Start Date End Date Taking? Authorizing Provider  amLODipine (NORVASC) 10 MG tablet Take 1 tablet (10 mg total) by mouth daily. Patient not taking: Reported on 02/25/2022 03/14/21   Pricilla Loveless, MD  amoxicillin-clavulanate (AUGMENTIN) [phone]  MG tablet Take 1 tablet by mouth every 12 (twelve) hours. Patient not taking: Reported on 02/25/2022 01/31/22   Sherian Maroon A, PA  ibuprofen (ADVIL) 600 MG tablet Take 1 tablet (600 mg total) by mouth every 6 (six) hours as needed. Patient not taking: Reported on 02/25/2022 01/31/22   Sherian Maroon A, PA  lidocaine (LIDODERM) 5 % Place 1 patch onto the skin daily. Remove & Discard patch within 12 hours or as directed by MD Patient not taking: Reported on 02/25/2022 03/17/21   Triplett, Tammy, PA-C  methocarbamol (ROBAXIN) 500 MG tablet Take 1 tablet (500 mg total) by mouth every 8 (eight) hours as needed for muscle  spasms. Patient not taking: Reported on 04/15/2021 03/14/21   Pricilla Loveless, MD  metroNIDAZOLE (FLAGYL) 500 MG tablet Take 1 tablet (500 mg total) by mouth 2 (two) times daily. Patient not taking: Reported on 02/25/2022 01/31/22   Sherian Maroon A, PA  omeprazole (PRILOSEC) 20 MG capsule Take 1 capsule (20 mg total) by mouth daily. Patient not taking: Reported on 02/25/2022 03/17/21   Triplett, Tammy, PA-C  ondansetron (ZOFRAN) 4 MG tablet Take 1 tablet (4 mg total) by mouth every 6 (six) hours. Patient not taking: Reported on 02/25/2022 01/31/22   Sherian Maroon A, PA  oxyCODONE-acetaminophen (PERCOCET/ROXICET) 5-325 MG tablet Take 1 tablet by mouth every 6 (six) hours as needed. Patient not taking: Reported on 04/15/2021 03/17/21   Triplett, Babette Relic, PA-C  predniSONE (STERAPRED UNI-PAK 21 TAB) 10 MG (21) TBPK tablet TAKE 6,5,4,3,2,1 one tablet less each day with food. Patient not taking: Reported on 02/25/2022 04/15/21   Eldred Manges, MD  prenatal vitamin w/FE, FA (PRENATAL 1 + 1) 27-1 MG TABS tablet Take 1 tablet by mouth daily at 12 noon. Patient not taking: Reported on 04/18/2018 12/08/15   Adline Potter, NP  triamterene-hydrochlorothiazide (MAXZIDE-25) 37.5-25 MG tablet Take 1 tablet by mouth daily. Patient not taking: Reported on 02/25/2022 03/14/21   Pricilla Loveless, MD    Allergies   Allergies  Allergen Reactions   Latex Itching and Rash    Review of Systems  ROS  Neurologic Exam  Awake, alert, oriented Memory and concentration grossly intact Speech fluent, appropriate CN grossly intact Motor exam: Upper Extremities Deltoid Bicep Tricep Grip  Right 5/5 5/5 5/5 5/5  Left 5/5 5/5 5/5 5/5   Lower Extremities IP Quad PF DF EHL  Right 5/5 5/5 5/5 5/5 5/5  Left 5/5 5/5 5/5 5/5 5/5   Sensation grossly intact to LT   Impression  - 37 y.o. female with incidental discovery of multiple right-sided aneurysms now s/p coil embolization of RICA aneurysm and untreated irregular right  anterior temporal aneurysm.  Plan  - Will proceed with right pterional crani for clipping of MCA aneurysm  I have reviewed the indications for the procedure as well as the details of the procedure and the expected postoperative course and recovery at length with the patient in the office. We have also reviewed in detail the risks, benefits, and alternatives to the procedure. All questions were answered and Kristin Simmons provided informed consent to proceed.  Lisbeth Renshaw, MD [facility]

## 2022-06-01 ENCOUNTER — Inpatient Hospital Stay (HOSPITAL_COMMUNITY): Payer: Medicaid Other

## 2022-06-01 ENCOUNTER — Encounter (HOSPITAL_COMMUNITY): Payer: Self-pay | Admitting: Neurosurgery

## 2022-06-01 DIAGNOSIS — Z9889 Other specified postprocedural states: Secondary | ICD-10-CM | POA: Diagnosis not present

## 2022-06-01 DIAGNOSIS — J988 Other specified respiratory disorders: Secondary | ICD-10-CM

## 2022-06-01 DIAGNOSIS — I609 Nontraumatic subarachnoid hemorrhage, unspecified: Secondary | ICD-10-CM | POA: Diagnosis not present

## 2022-06-01 LAB — CBC WITH DIFFERENTIAL/PLATELET
Abs Immature Granulocytes: 0.12 10*3/uL — ABNORMAL HIGH (ref 0.00–0.07)
Basophils Absolute: 0 10*3/uL (ref 0.0–0.1)
Basophils Relative: 0 %
Eosinophils Absolute: 0 10*3/uL (ref 0.0–0.5)
Eosinophils Relative: 0 %
HCT: 34.6 % — ABNORMAL LOW (ref 36.0–46.0)
Hemoglobin: 12.5 g/dL (ref 12.0–15.0)
Immature Granulocytes: 1 %
Lymphocytes Relative: 4 %
Lymphs Abs: 0.7 10*3/uL (ref 0.7–4.0)
MCH: 32.2 pg (ref 26.0–34.0)
MCHC: 36.1 g/dL — ABNORMAL HIGH (ref 30.0–36.0)
MCV: 89.2 fL (ref 80.0–100.0)
Monocytes Absolute: 1 10*3/uL (ref 0.1–1.0)
Monocytes Relative: 5 %
Neutro Abs: 15.9 10*3/uL — ABNORMAL HIGH (ref 1.7–7.7)
Neutrophils Relative %: 90 %
Platelets: 248 10*3/uL (ref 150–400)
RBC: 3.88 MIL/uL (ref 3.87–5.11)
RDW: 15 % (ref 11.5–15.5)
WBC: 17.8 10*3/uL — ABNORMAL HIGH (ref 4.0–10.5)
nRBC: 0 % (ref 0.0–0.2)

## 2022-06-01 LAB — POCT I-STAT, CHEM 8
BUN: 7 mg/dL (ref 6–20)
BUN: 7 mg/dL (ref 6–20)
Calcium, Ion: 1.11 mmol/L — ABNORMAL LOW (ref 1.15–1.40)
Calcium, Ion: 1.01 mmol/L — ABNORMAL LOW (ref 1.15–1.40)
Chloride: 99 mmol/L (ref 98–111)
Chloride: 95 mmol/L — ABNORMAL LOW (ref 98–111)
Creatinine, Ser: 0.7 mg/dL (ref 0.44–1.00)
Creatinine, Ser: 0.7 mg/dL (ref 0.44–1.00)
Glucose, Bld: 123 mg/dL — ABNORMAL HIGH (ref 70–99)
Glucose, Bld: 137 mg/dL — ABNORMAL HIGH (ref 70–99)
HCT: 29 % — ABNORMAL LOW (ref 36.0–46.0)
HCT: 37 % (ref 36.0–46.0)
Hemoglobin: 9.9 g/dL — ABNORMAL LOW (ref 12.0–15.0)
Hemoglobin: 12.6 g/dL (ref 12.0–15.0)
Potassium: 3.4 mmol/L — ABNORMAL LOW (ref 3.5–5.1)
Potassium: 4.2 mmol/L (ref 3.5–5.1)
Sodium: 131 mmol/L — ABNORMAL LOW (ref 135–145)
Sodium: 126 mmol/L — ABNORMAL LOW (ref 135–145)
TCO2: 20 mmol/L — ABNORMAL LOW (ref 22–32)
TCO2: 20 mmol/L — ABNORMAL LOW (ref 22–32)

## 2022-06-01 LAB — COMPREHENSIVE METABOLIC PANEL
ALT: 17 U/L (ref 0–44)
AST: 25 U/L (ref 15–41)
Albumin: 3 g/dL — ABNORMAL LOW (ref 3.5–5.0)
Alkaline Phosphatase: 36 U/L — ABNORMAL LOW (ref 38–126)
Anion gap: 7 (ref 5–15)
BUN: 8 mg/dL (ref 6–20)
CO2: 20 mmol/L — ABNORMAL LOW (ref 22–32)
Calcium: 7.4 mg/dL — ABNORMAL LOW (ref 8.9–10.3)
Chloride: 112 mmol/L — ABNORMAL HIGH (ref 98–111)
Creatinine, Ser: 0.77 mg/dL (ref 0.44–1.00)
GFR, Estimated: >60 mL/min (ref >60)
Glucose, Bld: 162 mg/dL — ABNORMAL HIGH (ref 70–99)
Potassium: 3.6 mmol/L (ref 3.5–5.1)
Sodium: 139 mmol/L (ref 135–145)
Total Bilirubin: 1.5 mg/dL — ABNORMAL HIGH (ref 0.3–1.2)
Total Protein: 6 g/dL — ABNORMAL LOW (ref 6.5–8.1)

## 2022-06-01 LAB — GLUCOSE, CAPILLARY
Glucose-Capillary: 157 mg/dL — ABNORMAL HIGH (ref 70–99)
Glucose-Capillary: 137 mg/dL — ABNORMAL HIGH (ref 70–99)
Glucose-Capillary: 143 mg/dL — ABNORMAL HIGH (ref 70–99)
Glucose-Capillary: 187 mg/dL — ABNORMAL HIGH (ref 70–99)

## 2022-06-01 LAB — TRIGLYCERIDES: Triglycerides: 63 mg/dL (ref <150)

## 2022-06-01 LAB — BPAM RBC
Blood Product Expiration Date: 202405152359
ISSUE DATE / TIME: 202404160829
Unit Type and Rh: 5100

## 2022-06-01 LAB — TYPE AND SCREEN
Antibody Screen: NEGATIVE
Unit division: 0

## 2022-06-01 LAB — PHOSPHORUS
Phosphorus: 3.8 mg/dL (ref 2.5–4.6)
Phosphorus: 3.3 mg/dL (ref 2.5–4.6)

## 2022-06-01 LAB — MAGNESIUM
Magnesium: 1.5 mg/dL — ABNORMAL LOW (ref 1.7–2.4)
Magnesium: 1.7 mg/dL (ref 1.7–2.4)

## 2022-06-01 LAB — HIV ANTIBODY (ROUTINE TESTING W REFLEX): HIV Screen 4th Generation wRfx: NONREACTIVE

## 2022-06-01 MED ORDER — FENTANYL 2500MCG IN NS 250ML (10MCG/ML) PREMIX INFUSION
50.0000 ug/h | INTRAVENOUS | Status: DC
  Administered 2022-06-01 – 2022-06-03 (×2): 50 ug/h via INTRAVENOUS
  Filled 2022-06-01 (×2): qty 250

## 2022-06-01 MED ORDER — THIAMINE MONONITRATE 100 MG PO TABS
100.0000 mg | ORAL_TABLET | Freq: Every day | ORAL | Status: DC
  Administered 2022-06-01 – 2022-06-21 (×21): 100 mg
  Filled 2022-06-01 (×21): qty 1

## 2022-06-01 MED ORDER — VITAL HIGH PROTEIN PO LIQD: 1000.0000 mL | ORAL | Status: DC

## 2022-06-01 MED ORDER — FENTANYL BOLUS VIA INFUSION
50.0000 ug | INTRAVENOUS | Status: DC | PRN
  Administered 2022-06-01: 25 ug via INTRAVENOUS

## 2022-06-01 MED ORDER — CALCIUM GLUCONATE-NACL 2-0.675 GM/100ML-% IV SOLN
2.0000 g | Freq: Once | INTRAVENOUS | Status: AC
  Administered 2022-06-01: 2000 mg via INTRAVENOUS
  Filled 2022-06-01: qty 100

## 2022-06-01 MED ORDER — OSMOLITE 1.5 CAL PO LIQD
1000.0000 mL | ORAL | Status: DC
  Administered 2022-06-01 – 2022-06-15 (×10): 1000 mL
  Filled 2022-06-01: qty 1000

## 2022-06-01 MED ORDER — FENTANYL CITRATE PF 50 MCG/ML IJ SOSY: 50.0000 ug | PREFILLED_SYRINGE | Freq: Once | INTRAMUSCULAR | Status: DC

## 2022-06-01 MED ORDER — INSULIN ASPART 100 UNIT/ML IJ SOLN
0.0000 [IU] | INTRAMUSCULAR | Status: DC
  Administered 2022-06-01: 2 [IU] via SUBCUTANEOUS
  Administered 2022-06-01 (×2): 3 [IU] via SUBCUTANEOUS
  Administered 2022-06-01: 2 [IU] via SUBCUTANEOUS
  Administered 2022-06-02 (×4): 3 [IU] via SUBCUTANEOUS
  Administered 2022-06-02: 2 [IU] via SUBCUTANEOUS
  Administered 2022-06-03: 3 [IU] via SUBCUTANEOUS
  Administered 2022-06-03: 2 [IU] via SUBCUTANEOUS
  Administered 2022-06-03 – 2022-06-04 (×4): 3 [IU] via SUBCUTANEOUS
  Administered 2022-06-04 – 2022-06-05 (×4): 2 [IU] via SUBCUTANEOUS
  Administered 2022-06-05 (×2): 3 [IU] via SUBCUTANEOUS
  Administered 2022-06-05 (×3): 2 [IU] via SUBCUTANEOUS
  Administered 2022-06-06 (×2): 3 [IU] via SUBCUTANEOUS
  Administered 2022-06-06: 2 [IU] via SUBCUTANEOUS
  Administered 2022-06-06 (×2): 3 [IU] via SUBCUTANEOUS
  Administered 2022-06-07 (×4): 2 [IU] via SUBCUTANEOUS
  Administered 2022-06-07 – 2022-06-08 (×3): 3 [IU] via SUBCUTANEOUS
  Administered 2022-06-08 – 2022-06-09 (×5): 2 [IU] via SUBCUTANEOUS
  Administered 2022-06-09: 3 [IU] via SUBCUTANEOUS
  Administered 2022-06-09 – 2022-06-10 (×2): 2 [IU] via SUBCUTANEOUS
  Administered 2022-06-10: 3 [IU] via SUBCUTANEOUS
  Administered 2022-06-10 (×2): 2 [IU] via SUBCUTANEOUS

## 2022-06-01 MED ORDER — PROSOURCE TF20 ENFIT COMPATIBL EN LIQD
60.0000 mL | Freq: Every day | ENTERAL | Status: DC
  Administered 2022-06-01 – 2022-06-21 (×21): 60 mL
  Filled 2022-06-01 (×21): qty 60

## 2022-06-01 MED ORDER — FOLIC ACID 1 MG PO TABS
1.0000 mg | ORAL_TABLET | Freq: Every day | ORAL | Status: DC
  Administered 2022-06-01 – 2022-06-21 (×21): 1 mg
  Filled 2022-06-01 (×21): qty 1

## 2022-06-01 MED ORDER — METOPROLOL TARTRATE 25 MG PO TABS
25.0000 mg | ORAL_TABLET | Freq: Two times a day (BID) | ORAL | Status: DC
  Administered 2022-06-01 – 2022-06-04 (×7): 25 mg
  Filled 2022-06-01 (×7): qty 1

## 2022-06-01 NOTE — Progress Notes (Signed)
  NEUROSURGERY PROGRESS NOTE   Pt seen and examined. No issues overnight.   EXAM: Temp:  [97.3 F (36.3 C)-98.9 F (37.2 C)] 98.9 F (37.2 C) (04/17 0800) Pulse Rate:  [59-105] 65 (04/17 0815) Resp:  [11-22] 11 (04/17 0815) BP: (106-158)/(52-122) 116/62 (04/17 0800) SpO2:  [99 %-100 %] 100 % (04/17 0815) Arterial Line BP: (121-177)/(52-108) 121/59 (04/17 0815) FiO2 (%):  [30 %-60 %] 30 % (04/17 0721) Intake/Output      04/16 0701 04/17 0700 04/17 0701 04/18 0700   I.V. (mL/kg) 3490.7 (66.9) 225.6 (4.3)   Blood 630    IV Piggyback 700    Total Intake(mL/kg) 4820.7 (92.3) 225.6 (4.3)   Urine (mL/kg/hr) 3550 (2.8) 150 (1.3)   Blood 250    Total Output 3800 150   Net +1020.7 +75.6         Intubated, on low-dose propofol/fentanyl Breathing spontaneously on vent Pupils 2-85mm, reactive Per RN prior to fentanyl bolus:   Following commands RUE/RLE  Minimal movement LUE/LLE  LABS: Lab Results  Component Value Date   CREATININE 0.77 06/01/2022   BUN 8 06/01/2022   NA 139 06/01/2022   K 3.6 06/01/2022   CL 112 (H) 06/01/2022   CO2 20 (L) 06/01/2022   Lab Results  Component Value Date   WBC 17.8 (H) 06/01/2022   HGB 12.5 06/01/2022   HCT 34.6 (L) 06/01/2022   MCV 89.2 06/01/2022   PLT 248 06/01/2022    IMAGING: CTH yesterday reviewed and demonstrates basal SAH with thicker clot within right Sylvian fissure. No MLS, no HCP  IMPRESSION: - 37 y.o. female with intraoperative aneurysm rupture during opening. Will treat as standard SAH.  PLAN: - Can work with Pt/OT today - Continue Nimotop - TCD today - Wean per PCCM, extubate at their discretion - Will repeat CTH tomorrow am   Lisbeth Renshaw, MD [facility]

## 2022-06-01 NOTE — Progress Notes (Signed)
Transcranial Doppler  Date POD PCO2 HCT BP  MCA ACA PCA OPHT SIPH VERT Basilar  4/17,rs     Right  Left   41  59   -35  -37   47  40   -25  -28   -36           Right  Left                                            Right  Left                                             Right  Left                                             Right  Left                                            Right  Left                                            Right  Left                                        MCA = Middle Cerebral Artery      OPHT = Opthalmic Artery     BASILAR = Basilar Artery   ACA = Anterior Cerebral Artery     SIPH = Carotid Siphon PCA = Posterior Cerebral Artery   VERT = Verterbral Artery                   Normal MCA = 62+\-12 ACA = 50+\-12 PCA = 42+\-23   Lindegaard Ratio - Right 1.9, left 2.2

## 2022-06-01 NOTE — Evaluation (Signed)
Occupational Therapy Evaluation Patient Details Name: Kristin Simmons MRN: [id number] DOB: 1985/08/23 Today's Date: 06/01/2022   History of Present Illness Pt is a 37yo female who was admitted for elective coiling of R MCA aneurysm however this was complicated by intraoperative rupture resulting in a R pterional craniectomy for clipping of MCA aneurysm. Placement of bone flap in subcutaneous abdominal pocket. PMH: anxiety, anemia, HTN, depression   Clinical Impression   Pt admitted with above and is now intubated and on propofal and fentynal. RN lifted all sedation and fentynal drips for PT/OT assessment. Pt with no eye opening but did follow majority of simple commands with the R UE and LE. Pt with noted weakness, impaired tone, and not active movement of L UE or LE. Pt did respond to noxious stimuli x 4 extremities by moving R UE and LE. Pt did reach up to itch nose purposefully to itch nose. Pt tolerated chair position x 7 min, VSS. Once pt medically stable and able to progress to OOB mobility pt will benefit greatly from intensive rehab program of >3 hours/day to achieve maximal functional recovery. OT will continue to follow acutely.        Recommendations for follow up therapy are one component of a multi-disciplinary discharge planning process, led by the attending physician.  Recommendations may be updated based on patient status, additional functional criteria and insurance authorization.   Assistance Recommended at Discharge Frequent or constant Supervision/Assistance  Patient can return home with the following Two people to help with walking and/or transfers;Two people to help with bathing/dressing/bathroom;Assistance with cooking/housework;Assistance with feeding;Direct supervision/assist for medications management;Direct supervision/assist for financial management;Assist for transportation;Help with stairs or ramp for entrance    Functional Status Assessment  Patient has had a  recent decline in their functional status and demonstrates the ability to make significant improvements in function in a reasonable and predictable amount of time.  Equipment Recommendations  Other (comment) (Will continue to assess)    Recommendations for Other Services       Precautions / Restrictions Precautions Precautions: Fall;Other (comment) Precaution Comments: R craniectomy, no bone flap, bone flap placed in abdomen Restrictions Weight Bearing Restrictions: No      Mobility Bed Mobility Overal bed mobility: Needs Assistance Bed Mobility:  (totalx2 to scoot up in bed)           General bed mobility comments: pt placed in chair position of bed with HOB all the way up, no eye opening, tolerated well from vital sign stand point. tolerated x 7 min    Transfers                   General transfer comment: not appropriate at this time      Balance Overall balance assessment: Needs assistance Sitting-balance support: Feet unsupported, No upper extremity supported Sitting balance-Leahy Scale: Zero Sitting balance - Comments: pt reliant on chair position in bed at this time                                   ADL either performed or assessed with clinical judgement   ADL                                         General ADL Comments: Will continue to assess once more awake and alert  Vision Patient Visual Report: Other (comment) Additional Comments: Patient not opening eyes, when OT manually completing, left eye initially rolled back in head, but then after 2-3 trials eye forward with no gaze devation noted, R eye also appropriate when manually opened     Perception     Praxis      Pertinent Vitals/Pain Pain Assessment Pain Assessment: Faces Faces Pain Scale: No hurt (but responsive to noxious stimuli x4 extremeties)     Hand Dominance     Extremity/Trunk Assessment Upper Extremity Assessment Upper Extremity  Assessment: LUE deficits/detail;RUE deficits/detail RUE Deficits / Details: Patient able to squeeze hand when prompted intially, then unable to repeat consistently (50% command following) trace activation felt to attempt to open hand but unable, activation noted at shoulder to attempt to hold hand up against gravity (cued with punch upward), but unable, patient then able to independently bring R hand up to head to scratch nose RUE Sensation: decreased light touch;decreased proprioception RUE Coordination: decreased fine motor;decreased gross motor LUE Deficits / Details: no activation noted, A line also in place   Lower Extremity Assessment Lower Extremity Assessment: Defer to PT evaluation RLE Deficits / Details: pt wiggled toes and attempted to kick out LE in chair position, pt able to hold LE in hooklying position, + sensation LLE Deficits / Details: appears to have heavier feel, less tone. pt unable to initiate movement reactively or to command LLE Sensation:  (pt did respond to noxious stimuli but moved R UE/LE not L)   Cervical / Trunk Assessment Cervical / Trunk Assessment: Normal   Communication Communication Communication:  (intubated)   Cognition Arousal/Alertness: Lethargic Behavior During Therapy: Flat affect Overall Cognitive Status: Difficult to assess                                 General Comments: RN turned off sedation and fentanyl drips. Pt did follow short simple commands with R UE and LE majority of the time but never more than once, ie. thumbs up (would only do first time but not additional times". pt did move R UE and LE in response to noxious stimuli x4 extremeties     General Comments  VSS, bilat eyes swollen, when manual opened L eye lid pt's eye was rolled up toward back of head. when R eye manually opened pt's eye in center    Exercises Exercises: Other exercises Other Exercises Other Exercises: passive ROM to bilat LEs in all  directions Other Exercises: R cervical rotation due to pt with head continuously turned to the L towards the vent   Shoulder Instructions      Home Living Family/patient expects to be discharged to:: Private residence Living Arrangements: Children                               Additional Comments: pt intubated and no family available to provide PLOF or home set up. Per RN pt was indep and has children.      Prior Functioning/Environment Prior Level of Function : Independent/Modified Independent                        OT Problem List: Decreased strength;Decreased range of motion;Decreased activity tolerance;Impaired balance (sitting and/or standing);Impaired vision/perception;Decreased coordination;Decreased cognition;Decreased safety awareness;Decreased knowledge of use of DME or AE;Decreased knowledge of precautions;Cardiopulmonary status limiting activity;Impaired sensation;Pain;Impaired UE functional use;Increased  edema      OT Treatment/Interventions: Self-care/ADL training;Therapeutic exercise;Neuromuscular education;Energy conservation;DME and/or AE instruction;Manual therapy;Therapeutic activities;Cognitive remediation/compensation;Visual/perceptual remediation/compensation;Patient/family education;Balance training    OT Goals(Current goals can be found in the care plan section) Acute Rehab OT Goals Patient Stated Goal: unable to state OT Goal Formulation: Patient unable to participate in goal setting Time For Goal Achievement: 06/15/22 Potential to Achieve Goals: Good  OT Frequency: Min 2X/week    Co-evaluation   Reason for Co-Treatment: Complexity of the patient's impairments (multi-system involvement) PT goals addressed during session: Mobility/safety with mobility        AM-PAC OT "6 Clicks" Daily Activity     Outcome Measure Help from another person eating meals?: Total Help from another person taking care of personal grooming?: Total Help  from another person toileting, which includes using toliet, bedpan, or urinal?: Total Help from another person bathing (including washing, rinsing, drying)?: Total Help from another person to put on and taking off regular upper body clothing?: Total Help from another person to put on and taking off regular lower body clothing?: Total 6 Click Score: 6   End of Session Nurse Communication: Mobility status  Activity Tolerance: Patient limited by fatigue;Patient limited by lethargy Patient left: in bed;with call bell/phone within reach;with bed alarm set  OT Visit Diagnosis: Muscle weakness (generalized) (M62.81);Other abnormalities of gait and mobility (R26.89);Unsteadiness on feet (R26.81);Other symptoms and signs involving cognitive function;Hemiplegia and hemiparesis Hemiplegia - Right/Left: Left Hemiplegia - dominant/non-dominant: Non-Dominant Hemiplegia - caused by: Other cerebrovascular disease                Time: 6063-0160 OT Time Calculation (min): 23 min Charges:  OT General Charges $OT Visit: 1 Visit OT Evaluation $OT Eval High Complexity: 1 High  Pollyann Glen E. , OTR/L Acute Rehabilitation Services [phone]   Cherlyn Cushing 06/01/2022, 4:06 PM

## 2022-06-01 NOTE — Evaluation (Signed)
Physical Therapy Evaluation Patient Details Name: Kristin Simmons MRN: [id number] DOB: March 08, 1985 Today's Date: 06/01/2022  History of Present Illness  Pt is a 37yo female who was admitted for elective coiling of R MCA aneurysm however this was complicated by intraoperative rupture resulting in a R pterional craniectomy for clipping of MCA aneurysm. Placement of bone flap in subcutaneous abdominal pocket. PMH: anxiety, anemia, HTN, depression   Clinical Impression  Pt admitted with above and is now intubated and on propofal and fentynal. RN lifted all sedation and fentynal drips for PT/OT assessment. Pt with no eye opening but did follow majority of simple commands with the R UE and LE. Pt with noted weakness, impaired tone, and not active movement of L UE or LE. Pt did respond to noxious stimuli x 4 extremities by moving R UE and LE. Pt did reach up to itch nose purposefully to itch nose. Pt tolerated chair position x 7 min, VSS. Once pt medically stable and able to progress to OOB mobility pt will benefit greatly from intensive rehab program of >3 hours/day to achieve maximal functional recovery. Acute PT to cont to follow.       Recommendations for follow up therapy are one component of a multi-disciplinary discharge planning process, led by the attending physician.  Recommendations may be updated based on patient status, additional functional criteria and insurance authorization.  Follow Up Recommendations       Assistance Recommended at Discharge Frequent or constant Supervision/Assistance  Patient can return home with the following       Equipment Recommendations  (TBD at next venue)  Recommendations for Other Services  Rehab consult    Functional Status Assessment Patient has had a recent decline in their functional status and demonstrates the ability to make significant improvements in function in a reasonable and predictable amount of time.     Precautions / Restrictions  Precautions Precautions: Fall;Other (comment) Precaution Comments: R craniectomy, no bone flap, bone flap placed in abdomen Restrictions Weight Bearing Restrictions: No      Mobility  Bed Mobility Overal bed mobility: Needs Assistance Bed Mobility:  (totalx2 to scoot up in bed)           General bed mobility comments: pt placed in chair position of bed with HOB all the way up, no eye opening, tolerated well from vital sign stand point. tolerated x 7 min    Transfers                   General transfer comment: not appropriate at this time    Ambulation/Gait               General Gait Details: unable this date  Stairs            Wheelchair Mobility    Modified Rankin (Stroke Patients Only) Modified Rankin (Stroke Patients Only) Pre-Morbid Rankin Score: No symptoms Modified Rankin: Severe disability     Balance Overall balance assessment: Needs assistance Sitting-balance support: Feet unsupported, No upper extremity supported Sitting balance-Leahy Scale: Zero Sitting balance - Comments: pt reliant on chair position in bed at this time                                     Pertinent Vitals/Pain Pain Assessment Pain Assessment: Faces Faces Pain Scale: No hurt (but responsive to noxious stimuli x4 extremeties)    Home Living Family/patient expects to  be discharged to:: Private residence Living Arrangements: Children                 Additional Comments: pt intubated and no family available to provide PLOF or home set up. Per RN pt was indep and has children.    Prior Function Prior Level of Function : Independent/Modified Independent                     Hand Dominance        Extremity/Trunk Assessment   Upper Extremity Assessment Upper Extremity Assessment: Defer to OT evaluation    Lower Extremity Assessment Lower Extremity Assessment: RLE deficits/detail;LLE deficits/detail RLE Deficits / Details: pt  wiggled toes and attempted to kick out LE in chair position, pt able to hold LE in hooklying position, + sensation LLE Deficits / Details: appears to have heavier feel, less tone. pt unable to initiate movement reactively or to command LLE Sensation:  (pt did respond to noxious stimuli but moved R UE/LE not L)    Cervical / Trunk Assessment Cervical / Trunk Assessment: Normal  Communication   Communication:  (intubated)  Cognition Arousal/Alertness: Lethargic Behavior During Therapy: Flat affect Overall Cognitive Status: Difficult to assess                                 General Comments: RN turned off sedation and fentanyl drips. Pt did follow short simple commands with R UE and LE majority of the time but never more than once, ie. thumbs up (would only do first time but not additional times". pt did move R UE and LE in response to noxious stimuli x4 extremeties        General Comments General comments (skin integrity, edema, etc.): VSS, bilat eyes swollen, when manual opened L eye lid pt's eye was rolled up toward back of head. when R eye manually opened pt's eye in center    Exercises Other Exercises Other Exercises: passive ROM to bilat LEs in all directions Other Exercises: R cervical rotation due to pt with head continuously turned to the L towards the vent   Assessment/Plan    PT Assessment Patient needs continued PT services  PT Problem List Decreased strength;Decreased range of motion;Decreased activity tolerance;Decreased balance;Decreased mobility;Decreased coordination;Decreased cognition;Decreased knowledge of use of DME       PT Treatment Interventions DME instruction;Gait training;Stair training;Functional mobility training;Therapeutic activities;Therapeutic exercise;Balance training;Neuromuscular re-education;Cognitive remediation;Patient/family education    PT Goals (Current goals can be found in the Care Plan section)  Acute Rehab PT Goals Patient  Stated Goal: unable to state PT Goal Formulation: Patient unable to participate in goal setting Time For Goal Achievement: 06/15/22 Potential to Achieve Goals: Good    Frequency Min 4X/week     Co-evaluation PT/OT/SLP Co-Evaluation/Treatment: Yes Reason for Co-Treatment: Complexity of the patient's impairments (multi-system involvement) PT goals addressed during session: Mobility/safety with mobility         AM-PAC PT "6 Clicks" Mobility  Outcome Measure Help needed turning from your back to your side while in a flat bed without using bedrails?: Total Help needed moving from lying on your back to sitting on the side of a flat bed without using bedrails?: Total Help needed moving to and from a bed to a chair (including a wheelchair)?: Total Help needed standing up from a chair using your arms (e.g., wheelchair or bedside chair)?: Total Help needed to walk in hospital room?:  Total Help needed climbing 3-5 steps with a railing? : Total 6 Click Score: 6    End of Session Equipment Utilized During Treatment:  (vent) Activity Tolerance: Patient limited by lethargy Patient left: in bed;with call bell/phone within reach;with bed alarm set;with nursing/sitter in room (in R sidelying proped on wedge) Nurse Communication: Mobility status PT Visit Diagnosis: Unsteadiness on feet (R26.81);Hemiplegia and hemiparesis Hemiplegia - Right/Left: Left Hemiplegia - dominant/non-dominant: Non-dominant Hemiplegia - caused by: Other cerebrovascular disease    Time: 1031-1054 PT Time Calculation (min) (ACUTE ONLY): 23 min   Charges:   PT Evaluation $PT Eval Moderate Complexity: 1 Mod          Lewis Shock, PT, DPT Acute Rehabilitation Services Secure chat preferred Office #: [phone]   Iona Hansen 06/01/2022, 1:10 PM

## 2022-06-01 NOTE — Procedures (Signed)
Cortrak  Tube Type:  Cortrak - 43 inches Tube Location:  Right nare Secured by: Bridle Technique Used to Measure Tube Placement:  Marking at nare/corner of mouth Cortrak Secured At:  66 cm   Cortrak Tube Team Note:  Consult received to place a Cortrak feeding tube.   X-ray is required, abdominal x-ray has been ordered by the Cortrak team. Please confirm tube placement before using the Cortrak tube.   If the tube becomes dislodged please keep the tube and contact the Cortrak team at [email or web address] for replacement.  If after hours and replacement cannot be delayed, place a NG tube and confirm placement with an abdominal x-ray.    Betsey Holiday MS, RD, LDN Please refer to [facility] for RD and/or RD on-call/weekend/after hours pager

## 2022-06-01 NOTE — Progress Notes (Addendum)
Initial Nutrition Assessment  DOCUMENTATION CODES:   Not applicable  INTERVENTION:   Initiate tube feeding via OG tube: Osmolite 1.5 at 25 ml/h and increase by 10 ml every 8 hours to goal rate of 45 ml/hr (1080 ml per day) Prosource TF20 60 ml daily  Provides 1700 kcal, 87 gm protein, 820 ml free water daily  100 mg thiamine daily per tube   Monitor magnesium and phosphorus every 12 hours x 4 occurrences, MD to replete as needed, as pt is at risk for refeeding syndrome given hx of weight loss in the past.  Recommend 1 mg folic acid daily per tube  NUTRITION DIAGNOSIS:   Inadequate oral intake related to inability to eat as evidenced by NPO status.  GOAL:   Patient will meet greater than or equal to 90% of their needs  MONITOR:   TF tolerance  REASON FOR ASSESSMENT:   Consult Enteral/tube feeding initiation and management  ASSESSMENT:   Pt with PMH of current smoker, ETOH use - drinks 2 beers per day, anemia, HTN, anxiety, asthma, cervial cancer, depression, and headaches admitted with R carotid aneurysm s/p coil embolization. 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   Pt discussed during ICU rounds and with RN.  Mom and 76 year old daughter at bedside. They report that pt does sometimes lose weight when stressed. They confirm pt did lose weight in late 2023 but she had since regained some of that weight now that her life has been more stable. She recently switched from cigarettes to vape and per mom drinks a beer at night to help her go to sleep.   04/16 - s/p craniectomy and clipping of R MCA aneurysm  04/17 - s/p cortrak tube placement; per xray tip in body/antral region of the stomach   Medications reviewed and include: pepcid, SSI, nimotop, miralax, senokot-s NS @ 75 ml/hr Fentanyl  Keppra Propofol @ 4 ml/hr provides: 105 kcal   Labs reviewed: WBC 17.8  NUTRITION - FOCUSED PHYSICAL EXAM:  Flowsheet  Row Most Recent Value  Orbital Region No depletion  Upper Arm Region No depletion  Thoracic and Lumbar Region No depletion  Buccal Region No depletion  Temple Region No depletion  Clavicle Bone Region No depletion  Clavicle and Acromion Bone Region No depletion  Scapular Bone Region No depletion  Dorsal Hand No depletion  Patellar Region No depletion  Anterior Thigh Region No depletion  Posterior Calf Region No depletion  Edema (RD Assessment) None  Hair Reviewed  Eyes Unable to assess  Mouth Unable to assess  Skin Reviewed  Nails Reviewed       Diet Order:   Diet Order             Diet NPO time specified  Diet effective now                   EDUCATION NEEDS:   Not appropriate for education at this time  Skin:  Skin Assessment: Skin Integrity Issues: Skin Integrity Issues:: Incisions Incisions: head and abd  Last BM:  unknown  Height:   Ht Readings from Last 1 Encounters:  05/31/22  (1.626 m)    Weight:   Wt Readings from Last 1 Encounters:  05/31/22 52.2 kg    BMI:  Body mass index is 19.74 kg/m.  Estimated Nutritional Needs:   Kcal:  1700-1900  Protein:  75-90 grams  Fluid:  >1.7 L/day   P.,  RD, LDN, CNSC See [facility] for contact information

## 2022-06-01 NOTE — Progress Notes (Signed)
Inpatient Rehab Admissions Coordinator:   Per PT recommendations pt was screened for CIR by Estill Dooms, PT, DPT.  Note on the vent at this time.  Will follow for liberation and therapy progress.   Estill Dooms, PT, DPT Admissions Coordinator [phone] 06/01/22  3:48 PM

## 2022-06-01 NOTE — Anesthesia Postprocedure Evaluation (Signed)
Anesthesia Post Note  Patient: Kristin Simmons  Procedure(s) Performed: RIGHT PTERIONAL CRANIOTOMY FOR CLIPPING OF MIDDLE CEREBRAL ARTERY  ANEURYSM WITH PLACEMENT OF BONEFLAP IN ABDOMEN (Right)     Patient location during evaluation: ICU Anesthesia Type: General Level of consciousness: sedated and patient remains intubated per anesthesia plan Pain management: pain level controlled Vital Signs Assessment: post-procedure vital signs reviewed and stable Respiratory status: patient remains intubated per anesthesia plan and patient on ventilator - see flowsheet for VS Cardiovascular status: blood pressure returned to baseline and stable Postop Assessment: no apparent nausea or vomiting Anesthetic complications: no Comments: Pt remains intubated and sedated post MCA aneurysm clipping. RN reports pt is moving her R extrem to command   No notable events documented.  Last Vitals:  Vitals:   06/01/22 1300 06/01/22 1330  BP: 116/77   Pulse: (!) 58 (!) 56  Resp: 16 16  Temp:    SpO2: 100% 100%    Last Pain:  Vitals:   06/01/22 1158  TempSrc: Oral  PainSc:                  ,E. 

## 2022-06-01 NOTE — Progress Notes (Signed)
SLP Cancellation Note  Patient Details Name: Kristin Simmons MRN: [id number] DOB: [birth year]   Cancelled treatment:       Reason Eval/Treat Not Completed: Patient not medically ready (Pt currently on the vent. SLP will follow up on subsequent date unless contacted sooner.)   I. Vear Clock, MS, CCC-SLP Acute Rehabilitation Services Office number [phone]  Scheryl Marten 06/01/2022, 8:24 AM

## 2022-06-01 NOTE — Consult Note (Signed)
NAME:  Kristin Simmons, MRN:  [id number], DOB:  October 30, 1985, LOS: 1 ADMISSION DATE:  05/31/2022, CONSULTATION DATE:  4/16 REFERRING MD:  Dr. 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  She was referred to neurosurgery.  She had coil embolization of Rt ophthalmic aneurysm.  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  She remained on vent post op, and PCCM consulted to assist with management in ICU.  Pertinent  Medical History  Anemia, HTN, Anxiety, Asthma, Cervical cancer, Depression, Headaches  Significant Hospital Events: Including procedures, antibiotic start and stop dates in addition to other pertinent events   4/16 To OR, craniectomy and clipping of Rt MCA aneurysm, stayed on vent, start cardene gtt  Interim History / Subjective:  Remains on vent, sedation, cardene.  Needing frequent doses of fentanyl.  Objective   Blood pressure 119/71, pulse 88, temperature 98.5 F (36.9 C), temperature source Oral, resp. rate 18, height  (1.626 m), weight 52.2 kg, last menstrual period 04/26/2022, SpO2 100 %.    Vent Mode: PRVC FiO2 (%):  [30 %-60 %] 30 % Set Rate:  [16 bmp-18 bmp] 16 bmp Vt Set:  [430 mL] 430 mL PEEP:  [5 cmH20] 5 cmH20 Plateau Pressure:  [14 cmH20-17 cmH20] 15 cmH20   Intake/Output Summary (Last 24 hours) at 06/01/2022 0756 Last data filed at 06/01/2022 0600 Gross per 24 hour  Intake 4820.65 ml  Output 3800 ml  Net 1020.65 ml   Filed Weights   05/31/22 0542  Weight: 52.2 kg    Examination:  General - sedated Eyes - periorbital  edema ENT - ETT in place Cardiac - regular rate/rhythm, no murmur Chest - equal breath sounds b/l, no wheezing or rales Abdomen - soft, non tender, + bowel sounds Extremities - no cyanosis, clubbing, or edema Skin - no rashes Neuro - RASS -2, follows some commands on Rt  Resolved Hospital Problem list     Assessment & Plan:   Compromised airway in setting of SAH. - pressure support wean as tolerated - defer extubation trial until neuro status more stable - goal SpO2 > 92% - f/u CXR intermittently  SAH with Rt MCA aneurysm clipping s/p decompressive craniectomy. - post op care, AEDs per neurosurgery - continue nimodipine  HTN. - wean cardene for goal SBP < 160 - hold outpt norvasc while on nimodipine - continue triamterene and HCTZ - add lopressor 25 mg bid  Sedation while on vent. - RASS goal 0 to -1 - diprivan, fentanyl gtt with prn versed  Hyperglycemia. - SSI  Best Practice (right click and "Reselect all SmartList Selections" daily)   Diet/type: tubefeeds DVT prophylaxis: SCD GI prophylaxis: H2B Lines: N/A Foley:  Yes, and it is still needed Code Status:  full code Last date of multidisciplinary goals of care discussion [NA]  Labs       Latest Ref Rng & Units 06/01/2022    6:13 AM 05/31/2022    3:13 PM 05/23/2022    9:03 AM  CMP  Glucose 70 - 99 mg/dL 409   80   BUN 6 -  20 mg/dL 8   7   Creatinine 8.46 - 1.00 mg/dL 9.62   9.52   Sodium 841 - 145 mmol/L 139  132  135   Potassium 3.5 - 5.1 mmol/L 3.6  3.7  3.6   Chloride 98 - 111 mmol/L 112   102   CO2 22 - 32 mmol/L 20   25   Calcium 8.9 - 10.3 mg/dL 7.4   9.1   Total Protein 6.5 - 8.1 g/dL 6.0   7.1   Total Bilirubin 0.3 - 1.2 mg/dL 1.5   0.7   Alkaline Phos 38 - 126 U/L 36   43   AST 15 - 41 U/L 25   21   ALT 0 - 44 U/L 17   20        Latest Ref Rng & Units 06/01/2022    6:13 AM 05/31/2022    3:13 PM 05/23/2022    9:03 AM  CBC  WBC 4.0 - 10.5 K/uL 17.8   6.4   Hemoglobin 12.0 - 15.0 g/dL 32.4   40.1  02.7   Hematocrit 36.0 - 46.0 % 34.6  46.0  39.1   Platelets 150 - 400 K/uL 248   324     ABG    Component Value Date/Time   PHART 7.405 05/31/2022 1513   PCO2ART 27.9 (L) 05/31/2022 1513   PO2ART 239 (H) 05/31/2022 1513   HCO3 17.6 (L) 05/31/2022 1513   TCO2 18 (L) 05/31/2022 1513   ACIDBASEDEF 6.0 (H) 05/31/2022 1513   O2SAT 100 05/31/2022 1513    CBG (last 3)  No results for input(s): "GLUCAP" in the last 72 hours.   Critical care time: 33 minutes  Coralyn Helling, MD [facility] Pulmonary/Critical Care Pager - [phone] or [phone] 06/01/2022, 7:59 AM

## 2022-06-02 ENCOUNTER — Inpatient Hospital Stay (HOSPITAL_COMMUNITY): Payer: Medicaid Other

## 2022-06-02 ENCOUNTER — Other Ambulatory Visit: Payer: Self-pay

## 2022-06-02 DIAGNOSIS — I619 Nontraumatic intracerebral hemorrhage, unspecified: Secondary | ICD-10-CM | POA: Diagnosis present

## 2022-06-02 DIAGNOSIS — Z9889 Other specified postprocedural states: Secondary | ICD-10-CM | POA: Diagnosis not present

## 2022-06-02 DIAGNOSIS — I609 Nontraumatic subarachnoid hemorrhage, unspecified: Secondary | ICD-10-CM | POA: Diagnosis not present

## 2022-06-02 DIAGNOSIS — J988 Other specified respiratory disorders: Secondary | ICD-10-CM | POA: Diagnosis not present

## 2022-06-02 LAB — CBC
HCT: 35.5 % — ABNORMAL LOW (ref 36.0–46.0)
Hemoglobin: 12.6 g/dL (ref 12.0–15.0)
MCH: 32.2 pg (ref 26.0–34.0)
MCHC: 35.5 g/dL (ref 30.0–36.0)
MCV: 90.8 fL (ref 80.0–100.0)
Platelets: 225 10*3/uL (ref 150–400)
RBC: 3.91 MIL/uL (ref 3.87–5.11)
RDW: 15.2 % (ref 11.5–15.5)
WBC: 20.2 10*3/uL — ABNORMAL HIGH (ref 4.0–10.5)
nRBC: 0 % (ref 0.0–0.2)

## 2022-06-02 LAB — SODIUM
Sodium: 135 mmol/L (ref 135–145)
Sodium: 137 mmol/L (ref 135–145)
Sodium: 138 mmol/L (ref 135–145)

## 2022-06-02 LAB — GLUCOSE, CAPILLARY
Glucose-Capillary: 173 mg/dL — ABNORMAL HIGH (ref 70–99)
Glucose-Capillary: 151 mg/dL — ABNORMAL HIGH (ref 70–99)
Glucose-Capillary: 162 mg/dL — ABNORMAL HIGH (ref 70–99)
Glucose-Capillary: 131 mg/dL — ABNORMAL HIGH (ref 70–99)
Glucose-Capillary: 159 mg/dL — ABNORMAL HIGH (ref 70–99)
Glucose-Capillary: 151 mg/dL — ABNORMAL HIGH (ref 70–99)

## 2022-06-02 LAB — BASIC METABOLIC PANEL
Anion gap: 5 (ref 5–15)
BUN: 8 mg/dL (ref 6–20)
CO2: 23 mmol/L (ref 22–32)
Calcium: 8.6 mg/dL — ABNORMAL LOW (ref 8.9–10.3)
Chloride: 106 mmol/L (ref 98–111)
Creatinine, Ser: 0.65 mg/dL (ref 0.44–1.00)
GFR, Estimated: >60 mL/min (ref >60)
Glucose, Bld: 133 mg/dL — ABNORMAL HIGH (ref 70–99)
Potassium: 4.1 mmol/L (ref 3.5–5.1)
Sodium: 134 mmol/L — ABNORMAL LOW (ref 135–145)

## 2022-06-02 LAB — PHOSPHORUS
Phosphorus: 2.9 mg/dL (ref 2.5–4.6)
Phosphorus: 2.5 mg/dL (ref 2.5–4.6)

## 2022-06-02 LAB — HEMOGLOBIN A1C
Hgb A1c MFr Bld: 5.1 % (ref 4.8–5.6)
Mean Plasma Glucose: 99.67 mg/dL

## 2022-06-02 LAB — MAGNESIUM
Magnesium: 1.8 mg/dL (ref 1.7–2.4)
Magnesium: 1.9 mg/dL (ref 1.7–2.4)

## 2022-06-02 MED ORDER — SODIUM CHLORIDE 0.9% FLUSH
10.0000 mL | INTRAVENOUS | Status: DC | PRN
  Administered 2022-06-11: 10 mL

## 2022-06-02 MED ORDER — SODIUM CHLORIDE 0.9 % IV SOLN: INTRAVENOUS | Status: DC | PRN

## 2022-06-02 MED ORDER — POTASSIUM & SODIUM PHOSPHATES 280-160-250 MG PO PACK
2.0000 | PACK | Freq: Once | ORAL | Status: AC
  Administered 2022-06-02: 2
  Filled 2022-06-02: qty 2

## 2022-06-02 MED ORDER — SODIUM CHLORIDE 0.9% FLUSH
10.0000 mL | Freq: Two times a day (BID) | INTRAVENOUS | Status: DC
  Administered 2022-06-02: 10 mL
  Administered 2022-06-03: 30 mL
  Administered 2022-06-03: 10 mL
  Administered 2022-06-04: 30 mL
  Administered 2022-06-04 – 2022-06-05 (×3): 10 mL
  Administered 2022-06-06: 20 mL
  Administered 2022-06-06 – 2022-06-14 (×16): 10 mL
  Administered 2022-06-15: 20 mL
  Administered 2022-06-15 – 2022-06-16 (×2): 10 mL
  Administered 2022-06-16: 20 mL
  Administered 2022-06-17 – 2022-06-19 (×5): 10 mL
  Administered 2022-06-20: 20 mL
  Administered 2022-06-20 – 2022-06-21 (×2): 10 mL

## 2022-06-02 MED ORDER — SODIUM CHLORIDE 3 % IV SOLN
INTRAVENOUS | Status: DC
  Filled 2022-06-02 (×6): qty 500

## 2022-06-02 NOTE — Progress Notes (Signed)
SLP Cancellation Note  Patient Details Name: Kristin Simmons MRN: [id number] DOB: 1985/07/02   Cancelled treatment:        Orders for speech-language-cognitive evaluation. Remains on vent. Will follow   Royce Macadamia 06/02/2022, 7:32 AM

## 2022-06-02 NOTE — Progress Notes (Signed)
  NEUROSURGERY PROGRESS NOTE   Pt seen and examined. No issues overnight. Repeat CTH done this am.  EXAM: Temp:  [97.3 F (36.3 C)-98.6 F (37 C)] 98.6 F (37 C) (04/18 0800) Pulse Rate:  [55-64] 58 (04/18 0800) Resp:  [16] 16 (04/18 0800) BP: (110-137)/(57-84) 129/79 (04/18 0800) SpO2:  [99 %-100 %] 100 % (04/18 0800) Arterial Line BP: (106-153)/(57-80) 148/74 (04/18 0800) FiO2 (%):  [30 %] 30 % (04/18 0723) Intake/Output      04/17 0701 04/18 0700 04/18 0701 04/19 0700   I.V. (mL/kg) 1821.9 (34.9) 14.2 (0.3)   Blood     NG/GT 620.5 70   IV Piggyback 300.1    Total Intake(mL/kg) 2742.6 (52.5) 84.2 (1.6)   Urine (mL/kg/hr) 1550 (1.2)    Blood     Total Output 1550    Net +1192.6 +84.2         Intubated, on low-dose propofol/fentanyl Breathing spontaneously on vent Pupils 2-52mm, reactive Following commands RUE/RLE, moves spontaneously and purposefully Minimal movement LUE/LLE Wound c/d/I, somewhat tense  LABS: Lab Results  Component Value Date   CREATININE 0.65 06/02/2022   BUN 8 06/02/2022   NA 134 (L) 06/02/2022   K 4.1 06/02/2022   CL 106 06/02/2022   CO2 23 06/02/2022   Lab Results  Component Value Date   WBC 20.2 (H) 06/02/2022   HGB 12.6 06/02/2022   HCT 35.5 (L) 06/02/2022   MCV 90.8 06/02/2022   PLT 225 06/02/2022    IMAGING: CTH this am reviewed and demonstrates worsening fontotemporal opercular edema with increased effacement of R lat ventricle and R->L MLS. No HCP.   TCD: Date POD PCO2 HCT BP   MCA ACA PCA OPHT SIPH VERT Basilar  4/17,rs         Right  Left   41  59   -35  -37   47  40   -25  -28   -36        IMPRESSION: - 37 y.o. female POD#2 with intraoperative aneurysm rupture during opening. Worsening edema but stable exam  PLAN: - will order PICC placement and start hypertonic saline @ 1cc/kg/hr, goal Na 150-155 - Continue Nimotop - With increased edema and initiation of hypertonic saline, agree with  continuation of ETT and mech vent until more stable   Lisbeth Renshaw, MD [facility]

## 2022-06-02 NOTE — Progress Notes (Signed)
Peripherally Inserted Central Catheter Placement  The IV Nurse has discussed with the patient and/or persons authorized to consent for the patient, the purpose of this procedure and the potential benefits and risks involved with this procedure.  The benefits include less needle sticks, lab draws from the catheter, and the patient may be discharged home with the catheter. Risks include, but not limited to, infection, bleeding, blood clot (thrombus formation), and puncture of an artery; nerve damage and irregular heartbeat and possibility to perform a PICC exchange if needed/ordered by physician.  Alternatives to this procedure were also discussed.  Bard Power PICC patient education guide, fact sheet on infection prevention and patient information card has been provided to patient /or left at bedside.    PICC Placement Documentation  PICC Triple Lumen 06/02/22 Right Brachial 34 cm 0 cm (Active)  Indication for Insertion or Continuance of Line Vasoactive infusions 06/02/22 1129  Exposed Catheter (cm) 0 cm 06/02/22 1129  Site Assessment Clean, Dry, Intact 06/02/22 1129  Lumen #1 Status Flushed;Blood return noted;Saline locked 06/02/22 1129  Lumen #2 Status Flushed;Blood return noted;Saline locked 06/02/22 1129  Lumen #3 Status Flushed;Blood return noted;Saline locked 06/02/22 1129  Dressing Type Transparent 06/02/22 1129  Dressing Status Antimicrobial disc in place 06/02/22 1129  Safety Lock Not Applicable 06/02/22 1129  Line Care Connections checked and tightened 06/02/22 1129  Line Adjustment (NICU/IV Team Only) Yes 06/02/22 1129  Dressing Intervention New dressing 06/02/22 1129  Dressing Change Due 06/09/22 06/02/22 1129       Audrie Gallus 06/02/2022, 11:31 AM

## 2022-06-02 NOTE — Progress Notes (Signed)
RT and RN Took pt vented to CT and back to 4N17 without complications.

## 2022-06-02 NOTE — Progress Notes (Signed)
NAME:  Kristin Simmons, MRN:  [id number], DOB:  [birth year], LOS: 2 ADMISSION DATE:  05/31/2022, CONSULTATION DATE:  4/16 REFERRING MD:  Dr. 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  She was referred to neurosurgery.  She had coil embolization of Rt ophthalmic aneurysm.  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  She remained on vent post op, and PCCM consulted to assist with management in ICU.  Pertinent  Medical History  Anemia, HTN, Anxiety, Asthma, Cervical cancer, Depression, Headaches  Significant Hospital Events: Including procedures, antibiotic start and stop dates in addition to other pertinent events   4/16 To OR, craniectomy and clipping of Rt MCA aneurysm, stayed on vent, start cardene gtt 4/18 CT head >> increased anterior and middle division Rt MCA territory edema with hemorrhagic transformation, increased mass effect with 4 mm leftward shift  Interim History / Subjective:  Remains on vent, sedation.  Objective   Blood pressure 124/83, pulse (!) 59, temperature 97.9 F (36.6 C), temperature source Axillary, resp. rate 16, height  (1.626 m), weight 52.2 kg, last menstrual period 04/26/2022, SpO2 100 %.    Vent Mode: PRVC FiO2 (%):  [30 %] 30 % Set Rate:  [16 bmp] 16 bmp Vt Set:  [430 mL] 430 mL PEEP:  [5 cmH20] 5 cmH20 Plateau Pressure:  [16 cmH20-18 cmH20] 16 cmH20   Intake/Output Summary (Last 24 hours) at 06/02/2022 0735 Last data filed at 06/02/2022 0600 Gross per 24 hour  Intake 2742.55 ml  Output 1550 ml  Net 1192.55 ml    Filed Weights   05/31/22 0542  Weight: 52.2 kg    Examination:  General - sedated Eyes - lateral gaze Rt eye ENT - ETT in place Cardiac - regular rate/rhythm, no murmur Chest - equal breath sounds b/l, no wheezing or rales Abdomen - soft, non tender, + bowel sounds Extremities - no cyanosis, clubbing, or edema Skin - no rashes Neuro - RASS -1, follows some simple ocmmands  Resolved Hospital Problem list     Assessment & Plan:   Compromised airway in setting of SAH. - pressure support wean as tolerated - defer extubation trial until neuro status more stable - goal SpO2 > 92% - f/u CXR intermittently  SAH with Rt MCA aneurysm clipping s/p decompressive craniectomy. - seems to have progression on CT head from 4/18 >> neurosurgery to determine additional interventions - post op care, AEDs per neurosurgery - continue nimodipine  HTN. - wean cardene for goal SBP < 160 - hold outpt norvasc while on nimodipine - continue lopressor, triamterene, HCTZ  Sedation while on vent. - wean diprivan, fentanyl gtt for RASS goal 0 to -1 - prn versed  Hyperglycemia. - SSI  Best Practice (right click and "Reselect all SmartList Selections" daily)   Diet/type: tubefeeds DVT prophylaxis: SCD GI prophylaxis: H2B Lines: N/A Foley:  Yes, and it is still needed Code Status:  full code Last date of multidisciplinary goals of care discussion [NA]  Labs       Latest Ref Rng & Units 06/02/2022    5:00 AM 06/01/2022  6:13 AM 05/31/2022    3:13 PM  CMP  Glucose 70 - 99 mg/dL 213  086    BUN 6 - 20 mg/dL 8  8    Creatinine 5.78 - 1.00 mg/dL 4.69  6.29    Sodium 528 - 145 mmol/L 134  139  132   Potassium 3.5 - 5.1 mmol/L 4.1  3.6  3.7   Chloride 98 - 111 mmol/L 106  112    CO2 22 - 32 mmol/L 23  20    Calcium 8.9 - 10.3 mg/dL 8.6  7.4    Total Protein 6.5 - 8.1 g/dL  6.0    Total Bilirubin 0.3 - 1.2 mg/dL  1.5    Alkaline Phos 38 - 126 U/L  36    AST 15 - 41 U/L  25    ALT 0  - 44 U/L  17         Latest Ref Rng & Units 06/02/2022    5:00 AM 06/01/2022    6:13 AM 05/31/2022    3:13 PM  CBC  WBC 4.0 - 10.5 K/uL 20.2  17.8    Hemoglobin 12.0 - 15.0 g/dL 41.3  24.4  01.0   Hematocrit 36.0 - 46.0 % 35.5  34.6  46.0   Platelets 150 - 400 K/uL 225  248      ABG    Component Value Date/Time   PHART 7.405 05/31/2022 1513   PCO2ART 27.9 (L) 05/31/2022 1513   PO2ART 239 (H) 05/31/2022 1513   HCO3 17.6 (L) 05/31/2022 1513   TCO2 18 (L) 05/31/2022 1513   ACIDBASEDEF 6.0 (H) 05/31/2022 1513   O2SAT 100 05/31/2022 1513    CBG (last 3)  Recent Labs    06/01/22 1931 06/01/22 2317 06/02/22 0322  GLUCAP 143* 187* 173*     Critical care time: 32 minutes  Coralyn Helling, MD [facility] Pager - [phone] or [phone] 06/02/2022, 7:35 AM

## 2022-06-02 NOTE — Progress Notes (Signed)
Assisted tele visit to patient with family member.  ,  Anderson, RN   

## 2022-06-03 ENCOUNTER — Inpatient Hospital Stay (HOSPITAL_COMMUNITY): Payer: Medicaid Other

## 2022-06-03 DIAGNOSIS — I629 Nontraumatic intracranial hemorrhage, unspecified: Secondary | ICD-10-CM

## 2022-06-03 DIAGNOSIS — Z9889 Other specified postprocedural states: Secondary | ICD-10-CM | POA: Diagnosis not present

## 2022-06-03 LAB — BPAM RBC
Blood Product Expiration Date: 202404232359
Blood Product Expiration Date: 202404232359
Blood Product Expiration Date: 202405142359
Blood Product Expiration Date: 202405152359
ISSUE DATE / TIME: 202404160955
Unit Type and Rh: 5100
Unit Type and Rh: 5100

## 2022-06-03 LAB — TYPE AND SCREEN
Unit division: 0
Unit division: 0
Unit division: 0

## 2022-06-03 LAB — GLUCOSE, CAPILLARY
Glucose-Capillary: 157 mg/dL — ABNORMAL HIGH (ref 70–99)
Glucose-Capillary: 176 mg/dL — ABNORMAL HIGH (ref 70–99)
Glucose-Capillary: 187 mg/dL — ABNORMAL HIGH (ref 70–99)
Glucose-Capillary: 98 mg/dL (ref 70–99)
Glucose-Capillary: 132 mg/dL — ABNORMAL HIGH (ref 70–99)
Glucose-Capillary: 114 mg/dL — ABNORMAL HIGH (ref 70–99)

## 2022-06-03 LAB — BASIC METABOLIC PANEL
Anion gap: 7 (ref 5–15)
BUN: 7 mg/dL (ref 6–20)
CO2: 22 mmol/L (ref 22–32)
Calcium: 8.7 mg/dL — ABNORMAL LOW (ref 8.9–10.3)
Chloride: 109 mmol/L (ref 98–111)
Creatinine, Ser: 0.53 mg/dL (ref 0.44–1.00)
GFR, Estimated: >60 mL/min (ref >60)
Glucose, Bld: 134 mg/dL — ABNORMAL HIGH (ref 70–99)
Potassium: 4.5 mmol/L (ref 3.5–5.1)
Sodium: 138 mmol/L (ref 135–145)

## 2022-06-03 LAB — SODIUM
Sodium: 139 mmol/L (ref 135–145)
Sodium: 141 mmol/L (ref 135–145)

## 2022-06-03 LAB — CULTURE, RESPIRATORY W GRAM STAIN

## 2022-06-03 MED ORDER — LABETALOL HCL 5 MG/ML IV SOLN
20.0000 mg | INTRAVENOUS | Status: DC | PRN
  Administered 2022-06-04 (×5): 20 mg via INTRAVENOUS
  Filled 2022-06-03 (×5): qty 4

## 2022-06-03 MED ORDER — SODIUM CHLORIDE 3 % IV SOLN
INTRAVENOUS | Status: DC
Start: 2022-06-03 — End: 2022-06-03

## 2022-06-03 NOTE — Progress Notes (Signed)
  NEUROSURGERY PROGRESS NOTE   Pt seen and examined. No issues overnight.  EXAM: Temp:  [98.4 F (36.9 C)-99.2 F (37.3 C)] 98.9 F (37.2 C) (04/19 0400) Pulse Rate:  [52-187] 60 (04/19 0800) Resp:  [15-17] 16 (04/19 0800) BP: (120-151)/(75-94) 151/79 (04/19 0800) SpO2:  [98 %-100 %] 100 % (04/19 0800) Arterial Line BP: (125-195)/(60-92) 183/92 (04/19 0800) FiO2 (%):  [30 %] 30 % (04/19 0726) Weight:  [57.4 kg] 57.4 kg (04/19 0705) Intake/Output      04/18 0701 04/19 0700 04/19 0701 04/20 0700   I.V. (mL/kg) 1372.2 (26.3) 69.7 (1.2)   NG/GT 1011.8 45   IV Piggyback 200    Total Intake(mL/kg) 2584 (49.5) 114.7 (2)   Urine (mL/kg/hr) 2550 (2)    Stool 0    Total Output 2550    Net +34 +114.7        Stool Occurrence 2 x     Intubated, on low-dose propofol/fentanyl Breathing spontaneously on vent Pupils 2mm, reactive Following commands RUE/RLE, moves spontaneously and purposefully Minimal movement LUE/LLE Wound c/d/I, remains somewhat tense  LABS: Lab Results  Component Value Date   CREATININE 0.53 06/03/2022   BUN 7 06/03/2022   NA 138 06/03/2022   K 4.5 06/03/2022   CL 109 06/03/2022   CO2 22 06/03/2022   Lab Results  Component Value Date   WBC 20.2 (H) 06/02/2022   HGB 12.6 06/02/2022   HCT 35.5 (L) 06/02/2022   MCV 90.8 06/02/2022   PLT 225 06/02/2022     TCD: Date POD PCO2 HCT BP   MCA ACA PCA OPHT SIPH VERT Basilar  4/17,rs         Right  Left   41  59   -35  -37   47  40   -25  -28   -36        IMPRESSION: - 37 y.o. female POD#3 with intraoperative aneurysm rupture during opening. Worsening edema on CT yesterday but stable exam  PLAN: - Increase hypertonic saline to 1.5cc/kg/hr (75cc/hr), goal Na 150-155 - Continue Nimotop - With increased edema and initiation of hypertonic saline, agree with continuation of ETT and mech vent until more stable - SBP goal <136mmHg, A-line appears under-damped can use cuff  pressure   Lisbeth Renshaw, MD [facility]

## 2022-06-03 NOTE — Progress Notes (Signed)
Physical Therapy Treatment Patient Details Name: Kristin Simmons MRN: [id number] DOB: Jul 01, 1985 Today's Date: 06/03/2022   History of Present Illness Pt is a 37yo female who was admitted for elective coiling of R MCA aneurysm however this was complicated by intraoperative rupture resulting in a R pterional craniectomy for clipping of MCA aneurysm. Placement of bone flap in subcutaneous abdominal pocket. PMH: anxiety, anemia, HTN, depression    PT Comments    The pt presents today with improved command following and participation. She was able to follow simple commands with all extremities and demos good initiation of tasks such as wiping her face and nose and initiating sit-stand transfer. The pt continues to need mod-maxA to complete bed mobility and static sitting EOB due to poor balance and strength. She tolerated the treatment well with VSS and will continue to benefit from skilled PT to progress strength and balance for independence with seated tasks as well as progression of OOB mobility.    Recommendations for follow up therapy are one component of a multi-disciplinary discharge planning process, led by the attending physician.  Recommendations may be updated based on patient status, additional functional criteria and insurance authorization.  Follow Up Recommendations       Assistance Recommended at Discharge Frequent or constant Supervision/Assistance  Patient can return home with the following Two people to help with walking and/or transfers;A lot of help with bathing/dressing/bathroom;Assistance with cooking/housework;Direct supervision/assist for medications management;Direct supervision/assist for financial management;Assist for transportation;Help with stairs or ramp for entrance   Equipment Recommendations  Other (comment) (defer to post acute)    Recommendations for Other Services       Precautions / Restrictions Precautions Precautions: Fall;Other  (comment) Precaution Comments: R craniectomy, no bone flap, bone flap placed in abdomen, ETT Restrictions Weight Bearing Restrictions: No     Mobility  Bed Mobility Overal bed mobility: Needs Assistance Bed Mobility: Rolling, Supine to Sit, Sit to Supine Rolling: Max assist, +2 for physical assistance (due to fatigue)   Supine to sit: Mod assist, +2 for physical assistance, HOB elevated Sit to supine: Max assist, +2 for physical assistance (due to fatigue)   General bed mobility comments: pt needed modA to move LE and modA of 2 to raise at trunk to come to sitting EOB, then mod-maxA to maintain static sitting. pt with LOB in any direction, mostly back and to L. VSS. pt fatigued and required maxA of 2 to return to supine and reposition in bed    Transfers Overall transfer level: Needs assistance Equipment used: 2 person hand held assist Transfers: Sit to/from Stand Sit to Stand: Mod assist, +2 safety/equipment           General transfer comment: pt with great initiation to stand, no overt buckling in L knee but limited by incontinence of stool    Ambulation/Gait               General Gait Details: unable this date   Modified Rankin (Stroke Patients Only) Modified Rankin (Stroke Patients Only) Pre-Morbid Rankin Score: No symptoms Modified Rankin: Severe disability     Balance Overall balance assessment: Needs assistance Sitting-balance support: Feet unsupported, No upper extremity supported Sitting balance-Leahy Scale: Poor Sitting balance - Comments: pt with some attempt to correct, but LOB in any direction with sitting EOB and support removed Postural control: Posterior lean Standing balance support: Bilateral upper extremity supported, During functional activity Standing balance-Leahy Scale: Poor Standing balance comment: dependent on modA of 2 to maintain static stance  Cognition Arousal/Alertness: Lethargic Behavior  During Therapy: Flat affect Overall Cognitive Status: Difficult to assess                                 General Comments: RN turned off sedation prior to session. pt following simple commands in al extremities, delayed in LLE. pt with good initiation of movements such as wiping her face and standing. did nod appropriately        Exercises      General Comments General comments (skin integrity, edema, etc.): VSS on vent, bilateral eyes swollen but pt able to open levt slightly when cued.      Pertinent Vitals/Pain Pain Assessment Pain Assessment: Faces Faces Pain Scale: Hurts even more Pain Location: headache and neck Pain Descriptors / Indicators: Headache, Discomfort Pain Intervention(s): Limited activity within patient's tolerance, Monitored during session, Repositioned     PT Goals (current goals can now be found in the care plan section) Acute Rehab PT Goals Patient Stated Goal: none stated PT Goal Formulation: Patient unable to participate in goal setting Time For Goal Achievement: 06/15/22 Potential to Achieve Goals: Good Progress towards PT goals: Progressing toward goals    Frequency    Min 4X/week      PT Plan Current plan remains appropriate    Co-evaluation PT/OT/SLP Co-Evaluation/Treatment: Yes Reason for Co-Treatment: Complexity of the patient's impairments (multi-system involvement);Necessary to address cognition/behavior during functional activity;For patient/therapist safety;To address functional/ADL transfers PT goals addressed during session: Mobility/safety with mobility;Balance        AM-PAC PT "6 Clicks" Mobility   Outcome Measure  Help needed turning from your back to your side while in a flat bed without using bedrails?: Total Help needed moving from lying on your back to sitting on the side of a flat bed without using bedrails?: Total Help needed moving to and from a bed to a chair (including a wheelchair)?: Total Help  needed standing up from a chair using your arms (e.g., wheelchair or bedside chair)?: Total Help needed to walk in hospital room?: Total Help needed climbing 3-5 steps with a railing? : Total 6 Click Score: 6    End of Session Equipment Utilized During Treatment: Gait belt;Oxygen (vent) Activity Tolerance: Patient tolerated treatment well Patient left: in bed;with call bell/phone within reach;with bed alarm set;with family/visitor present;with restraints reapplied Nurse Communication: Mobility status PT Visit Diagnosis: Unsteadiness on feet (R26.81);Hemiplegia and hemiparesis Hemiplegia - Right/Left: Left Hemiplegia - dominant/non-dominant: Non-dominant Hemiplegia - caused by: Other cerebrovascular disease     Time: 1610-9604 PT Time Calculation (min) (ACUTE ONLY): 39 min  Charges:  $Therapeutic Exercise: 8-22 mins                     Vickki Muff, PT, DPT   Acute Rehabilitation Department Office [phone] Secure Chat Communication Preferred   Ronnie Derby 06/03/2022, 11:56 AM

## 2022-06-03 NOTE — Progress Notes (Signed)
Occupational Therapy Treatment Patient Details Name: Kristin Simmons MRN: [id number] DOB: 1985/04/30 Today's Date: 06/03/2022   History of present illness Pt is a 37yo female who was admitted for elective coiling of R MCA aneurysm however this was complicated by intraoperative rupture resulting in a R pterional craniectomy for clipping of MCA aneurysm. Placement of bone flap in subcutaneous abdominal pocket. PMH: anxiety, anemia, HTN, depression   OT comments  Patient with excellent improvement in PT and OT session to date. [name] mother present as well, with home set up now recorded. Patient following 100% of commands and purposeful movement in all extremities, though LUE remains most limited. Patient continues to need mod-maxA to complete bed mobility and static sitting EOB due to poor balance and strength. Patient mod A of 2 to stand in session, with excellent initiation. Patient able to wipe her face (L neglect/inattention noted) and to perform suction to mouth with min A. OT continues to highly recommend intensive rehab; will continue to follow.    Recommendations for follow up therapy are one component of a multi-disciplinary discharge planning process, led by the attending physician.  Recommendations may be updated based on patient status, additional functional criteria and insurance authorization.    Assistance Recommended at Discharge Frequent or constant Supervision/Assistance  Patient can return home with the following  Two people to help with walking and/or transfers;Assistance with cooking/housework;Assistance with feeding;Direct supervision/assist for medications management;Direct supervision/assist for financial management;Assist for transportation;Help with stairs or ramp for entrance;A lot of help with bathing/dressing/bathroom   Equipment Recommendations  Other (comment) (Will continue to assess)    Recommendations for Other Services      Precautions / Restrictions  Precautions Precautions: Fall;Other (comment) Precaution Comments: R craniectomy, no bone flap, bone flap placed in abdomen, ETT Restrictions Weight Bearing Restrictions: No       Mobility Bed Mobility Overal bed mobility: Needs Assistance Bed Mobility: Rolling, Supine to Sit, Sit to Supine Rolling: Max assist, +2 for physical assistance (due to fatigue)   Supine to sit: Mod assist, +2 for physical assistance, HOB elevated Sit to supine: Max assist, +2 for physical assistance (due to fatigue)   General bed mobility comments: pt needed modA to move LE and modA of 2 to raise at trunk to come to sitting EOB, then mod-maxA to maintain static sitting. pt with LOB in any direction, mostly back and to L. VSS. pt fatigued and required maxA of 2 to return to supine and reposition in bed    Transfers Overall transfer level: Needs assistance Equipment used: 2 person hand held assist Transfers: Sit to/from Stand Sit to Stand: Mod assist, +2 safety/equipment           General transfer comment: pt with great initiation to stand, no overt buckling in L knee but limited by incontinence of stool     Balance Overall balance assessment: Needs assistance Sitting-balance support: Feet unsupported, No upper extremity supported Sitting balance-Leahy Scale: Poor Sitting balance - Comments: pt with some attempt to correct, but LOB in any direction with sitting EOB and support removed Postural control: Posterior lean Standing balance support: Bilateral upper extremity supported, During functional activity Standing balance-Leahy Scale: Poor Standing balance comment: dependent on modA of 2 to maintain static stance                           ADL either performed or assessed with clinical judgement   ADL Overall ADL's : Needs assistance/impaired Eating/Feeding:  NPO   Grooming: Minimal assistance Grooming Details (indicate cue type and reason): min A for left side of face                  Toilet Transfer: +2 for physical assistance;+2 for safety/equipment;Moderate assistance Toilet Transfer Details (indicate cue type and reason): mod A of 2 to complete sit<>stand from EOB due to lines leads Toileting- Clothing Manipulation and Hygiene: Total assistance Toileting - Clothing Manipulation Details (indicate cue type and reason): rectal tube currently       General ADL Comments: Much improved, sitting balance at min gaurd to min A level, minimal impulsivity noted but would correct with minimal cues. Able to wipe face (left side neglect noted) but followed all commands at 100%.    Extremity/Trunk Assessment Upper Extremity Assessment LUE Deficits / Details: improvement in LUE, but minimal neglect noted            Vision   Additional Comments: eyes still closed for the most part, but will open when prompted, eyes very swollen impacting assessment   Perception     Praxis      Cognition Arousal/Alertness: Lethargic Behavior During Therapy: Flat affect Overall Cognitive Status: Difficult to assess                                 General Comments: RN turned off sedation prior to session. pt following simple commands in al extremities, delayed in LLE. pt with good initiation of movements such as wiping her face and standing. did nod appropriately        Exercises      Shoulder Instructions       General Comments VSS on vent, bilateral eyes swollen but pt able to open levt slightly when cued.    Pertinent Vitals/ Pain       Pain Assessment Faces Pain Scale: Hurts even more Pain Location: headache and neck Pain Descriptors / Indicators: Headache, Discomfort  Home Living Family/patient expects to be discharged to:: Private residence Living Arrangements: Children Available Help at Discharge: Family;Available 24 hours/day Type of Home: House (Townhome`) Home Access: Level entry     Home Layout: Bed/bath upstairs;Two level Alternate  Level Stairs-Number of Steps: 13   Bathroom Shower/Tub: Chief Strategy Officer: Standard     Home Equipment: None   Additional Comments: no bathroom on ground level, has 5 daughters ages 70 to 4      Prior Functioning/Environment              Frequency  Min 2X/week        Progress Toward Goals  OT Goals(current goals can now be found in the care plan section)  Progress towards OT goals: Progressing toward goals  Acute Rehab OT Goals Patient Stated Goal: unable to state OT Goal Formulation: Patient unable to participate in goal setting Time For Goal Achievement: 06/15/22 Potential to Achieve Goals: Good ADL Goals Pt Will Perform Lower Body Bathing: with min assist;sitting/lateral leans;sit to/from stand Pt Will Perform Lower Body Dressing: with min assist;sitting/lateral leans;sit to/from stand Pt Will Transfer to Toilet: with mod assist;ambulating Pt Will Perform Toileting - Clothing Manipulation and hygiene: with min assist;sitting/lateral leans;sit to/from stand  Plan Discharge plan remains appropriate    Co-evaluation      Reason for Co-Treatment: Complexity of the patient's impairments (multi-system involvement);Necessary to address cognition/behavior during functional activity;For patient/therapist safety;To address functional/ADL transfers PT goals addressed during  session: Mobility/safety with mobility;Balance        AM-PAC OT "6 Clicks" Daily Activity     Outcome Measure   Help from another person eating meals?: Total Help from another person taking care of personal grooming?: A Little Help from another person toileting, which includes using toliet, bedpan, or urinal?: Total Help from another person bathing (including washing, rinsing, drying)?: A Lot Help from another person to put on and taking off regular upper body clothing?: A Lot Help from another person to put on and taking off regular lower body clothing?: Total 6 Click Score:  10    End of Session Equipment Utilized During Treatment: Gait belt  OT Visit Diagnosis: Muscle weakness (generalized) (M62.81);Other abnormalities of gait and mobility (R26.89);Unsteadiness on feet (R26.81);Other symptoms and signs involving cognitive function;Hemiplegia and hemiparesis Hemiplegia - Right/Left: Left Hemiplegia - dominant/non-dominant: Non-Dominant Hemiplegia - caused by: Other cerebrovascular disease   Activity Tolerance Patient tolerated treatment well   Patient Left in bed;with call bell/phone within reach;with bed alarm set;with family/visitor present   Nurse Communication Mobility status        Time: 0981-1914 OT Time Calculation (min): 41 min  Charges: OT General Charges $OT Visit: 1 Visit OT Treatments $Self Care/Home Management : 23-37 mins  Pollyann Glen E. , OTR/L Acute Rehabilitation Services [phone]   Cherlyn Cushing 06/03/2022, 12:59 PM

## 2022-06-03 NOTE — Progress Notes (Signed)
  Transition of Care [facility]) Screening Note   Patient Details  Name: Kristin Simmons Date of Birth: 30-Aug-1985   Transition of Care [facility]) CM/SW Contact:    Mearl Latin, LCSW Phone Number: 06/03/2022, 9:16 AM    Transition of Care Department [facility]) has reviewed patient who is currently intubated. We will continue to monitor patient advancement through interdisciplinary progression rounds. If new patient transition needs arise, please place a TOC consult.

## 2022-06-03 NOTE — Progress Notes (Signed)
Transcranial Doppler  Date POD PCO2 HCT BP  MCA ACA PCA OPHT SIPH VERT Basilar  4/17,rs     Right  Left   41  59   -35  -37   47  40   -25  -28   -36      4/19 GC     Right  Left   77  73   -35  -45   11  49   19  19   48  20   *  *   *           Right  Left                                             Right  Left                                             Right  Left                                            Right  Left                                            Right  Left                                        MCA = Middle Cerebral Artery      OPHT = Opthalmic Artery     BASILAR = Basilar Artery   ACA = Anterior Cerebral Artery     SIPH = Carotid Siphon PCA = Posterior Cerebral Artery   VERT = Verterbral Artery                   Normal MCA = 62+\-12 ACA = 50+\-12 PCA = 42+\-23    * - Unable to insonate  Lindegaard Ratio - Right , Left

## 2022-06-03 NOTE — Progress Notes (Signed)
SLP Cancellation Note  Patient Details Name: Kristin Simmons MRN: [id number] DOB: [birth year]   Cancelled treatment:       Reason Eval/Treat Not Completed: Patient not medically ready. Pt remains on vent - will follow for speech-language-cognitive evaluation as able.    Mahala Menghini., M.A. CCC-SLP Acute Rehabilitation Services Office [phone]  Secure chat preferred  06/03/2022, 7:53 AM

## 2022-06-03 NOTE — Progress Notes (Signed)
NAME:  Kristin Simmons, MRN:  [id number], DOB:  1985/12/09, LOS: 3 ADMISSION DATE:  05/31/2022, CONSULTATION DATE:  4/16 REFERRING MD:  Dr. 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  She was referred to neurosurgery.  She had coil embolization of Rt ophthalmic aneurysm.  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  She remained on vent post op, and PCCM consulted to assist with management in ICU.  Pertinent  Medical History  Anemia, HTN, Anxiety, Asthma, Cervical cancer, Depression, Headaches  Significant Hospital Events: Including procedures, antibiotic start and stop dates in addition to other pertinent events   4/16 To OR, craniectomy and clipping of Rt MCA aneurysm, stayed on vent, start cardene gtt 4/18 CT head >> increased anterior and middle division Rt MCA territory edema with hemorrhagic transformation, increased mass effect with 4 mm leftward shift  Interim History / Subjective:  Remains intubated Propofol turned off this morning, following intermittent commands She is having loose stools  Objective   Blood pressure (!) 145/75, pulse 65, temperature 98.9 F (37.2 C), temperature source Oral, resp. rate 16, height  (1.626 m), weight 57.4 kg, last menstrual period 04/26/2022, SpO2 100 %.    Vent Mode: PRVC FiO2 (%):  [30 %] 30 % Set Rate:  [16 bmp] 16 bmp Vt Set:  [430 mL] 430 mL PEEP:  [5 cmH20] 5 cmH20 Pressure Support:  [8 cmH20] 8 cmH20 Plateau Pressure:  [16 cmH20-18 cmH20] 18 cmH20   Intake/Output Summary (Last 24 hours) at  06/03/2022 0745 Last data filed at 06/03/2022 0700 Gross per 24 hour  Intake 2583.99 ml  Output 2550 ml  Net 33.99 ml   Filed Weights   05/31/22 0542 06/03/22 0705  Weight: 52.2 kg 57.4 kg    Examination:  General - sedated Eyes - pinoint pupils but reactive ENT - ETT in place, purulent drainage right nare Cardiac - regular rate/rhythm, no murmur Chest - course breath sounds, no wheezing Abdomen - soft, non tender,+ distended, + bowel sounds Extremities - no cyanosis, clubbing, or edema Skin - no rashes Neuro - spontaneously moving right left, follows some simple commands intermittently  Resolved Hospital Problem list     Assessment & Plan:   Compromised airway in setting of SAH. - pressure support wean as tolerated - defer extubation until neuro status more stable - goal SpO2 > 92% - f/u CXR intermittently - send tracheal aspirate culture today  SAH with Rt MCA aneurysm clipping s/p decompressive craniectomy. - seems to have progression on CT head from 4/18 >> neurosurgery to determine additional interventions - post op care, AEDs per neurosurgery - continue nimodipine - SBP goal <182mmHg, A-line appears under-damped can use cuff pressure   HTN. - wean cardene for goal SBP < 160 - hold outpt norvasc while on nimodipine - continue lopressor, triamterene, HCTZ  Sedation while on vent. - diprivan weaned off this morning - wean fentanyl gtt for RASS goal 0 - prn versed  Hyperglycemia. - SSI  Abdominal Distention - check KUB - hold tube feeds for now - she is having bowel movements,  less concern for complete obstruction  Best Practice (right click and "Reselect all SmartList Selections" daily)   Diet/type: NPO w/ meds via tube DVT prophylaxis: SCD GI prophylaxis: H2B Lines: N/A Foley:  Yes, and it is still needed Code Status:  full code Last date of multidisciplinary goals of care discussion [NA]  Labs       Latest Ref Rng & Units 06/03/2022    6:06  AM 06/02/2022    9:50 PM 06/02/2022    5:20 PM  CMP  Glucose 70 - 99 mg/dL 324     BUN 6 - 20 mg/dL 7     Creatinine 4.01 - 1.00 mg/dL 0.27     Sodium 253 - 664 mmol/L 138  138  137   Potassium 3.5 - 5.1 mmol/L 4.5     Chloride 98 - 111 mmol/L 109     CO2 22 - 32 mmol/L 22     Calcium 8.9 - 10.3 mg/dL 8.7          Latest Ref Rng & Units 06/02/2022    5:00 AM 06/01/2022    6:13 AM 05/31/2022    3:13 PM  CBC  WBC 4.0 - 10.5 K/uL 20.2  17.8    Hemoglobin 12.0 - 15.0 g/dL 40.3  47.4  25.9   Hematocrit 36.0 - 46.0 % 35.5  34.6  46.0   Platelets 150 - 400 K/uL 225  248      ABG    Component Value Date/Time   PHART 7.405 05/31/2022 1513   PCO2ART 27.9 (L) 05/31/2022 1513   PO2ART 239 (H) 05/31/2022 1513   HCO3 17.6 (L) 05/31/2022 1513   TCO2 18 (L) 05/31/2022 1513   ACIDBASEDEF 6.0 (H) 05/31/2022 1513   O2SAT 100 05/31/2022 1513    CBG (last 3)  Recent Labs    06/02/22 2311 06/03/22 0312 06/03/22 0722  GLUCAP 151* 157* 176*     Critical care time: 40 minutes   Melody Comas, MD [facility] Office: [phone]   See Amion for personal pager PCCM on call pager [phone] until 7pm. Please call Elink 7p-7a. [phone]

## 2022-06-04 DIAGNOSIS — Z9889 Other specified postprocedural states: Secondary | ICD-10-CM | POA: Diagnosis not present

## 2022-06-04 LAB — CBC WITH DIFFERENTIAL/PLATELET
Abs Immature Granulocytes: 0.05 10*3/uL (ref 0.00–0.07)
Basophils Absolute: 0 10*3/uL (ref 0.0–0.1)
Basophils Relative: 0 %
Eosinophils Absolute: 0 10*3/uL (ref 0.0–0.5)
Eosinophils Relative: 0 %
HCT: 38.9 % (ref 36.0–46.0)
Hemoglobin: 13 g/dL (ref 12.0–15.0)
Immature Granulocytes: 0 %
Lymphocytes Relative: 5 %
Lymphs Abs: 0.8 10*3/uL (ref 0.7–4.0)
MCH: 32.3 pg (ref 26.0–34.0)
MCHC: 33.4 g/dL (ref 30.0–36.0)
MCV: 96.5 fL (ref 80.0–100.0)
Monocytes Absolute: 0.5 10*3/uL (ref 0.1–1.0)
Monocytes Relative: 3 %
Neutro Abs: 14.5 10*3/uL — ABNORMAL HIGH (ref 1.7–7.7)
Neutrophils Relative %: 92 %
Platelets: 224 10*3/uL (ref 150–400)
RBC: 4.03 MIL/uL (ref 3.87–5.11)
RDW: 14.9 % (ref 11.5–15.5)
WBC: 15.9 10*3/uL — ABNORMAL HIGH (ref 4.0–10.5)
nRBC: 0 % (ref 0.0–0.2)

## 2022-06-04 LAB — GLUCOSE, CAPILLARY
Glucose-Capillary: 116 mg/dL — ABNORMAL HIGH (ref 70–99)
Glucose-Capillary: 122 mg/dL — ABNORMAL HIGH (ref 70–99)
Glucose-Capillary: 140 mg/dL — ABNORMAL HIGH (ref 70–99)
Glucose-Capillary: 142 mg/dL — ABNORMAL HIGH (ref 70–99)
Glucose-Capillary: 151 mg/dL — ABNORMAL HIGH (ref 70–99)
Glucose-Capillary: 152 mg/dL — ABNORMAL HIGH (ref 70–99)

## 2022-06-04 LAB — CULTURE, RESPIRATORY W GRAM STAIN

## 2022-06-04 LAB — SODIUM
Sodium: 137 mmol/L (ref 135–145)
Sodium: 136 mmol/L (ref 135–145)

## 2022-06-04 LAB — TRIGLYCERIDES: Triglycerides: 89 mg/dL

## 2022-06-04 MED ORDER — SODIUM CHLORIDE 3 % IV SOLN
INTRAVENOUS | Status: DC
  Filled 2022-06-04: qty 500

## 2022-06-04 MED ORDER — DEXMEDETOMIDINE HCL IN NACL 400 MCG/100ML IV SOLN
0.0000 ug/kg/h | INTRAVENOUS | Status: DC
  Administered 2022-06-04: 0.8 ug/kg/h via INTRAVENOUS
  Administered 2022-06-04: 0.4 ug/kg/h via INTRAVENOUS
  Filled 2022-06-04 (×4): qty 100

## 2022-06-04 MED ORDER — SODIUM CHLORIDE 0.9 % IV SOLN
3.0000 g | Freq: Four times a day (QID) | INTRAVENOUS | Status: DC
  Administered 2022-06-04 – 2022-06-05 (×5): 3 g via INTRAVENOUS
  Filled 2022-06-04 (×5): qty 8

## 2022-06-04 MED ORDER — BETHANECHOL CHLORIDE 10 MG PO TABS
10.0000 mg | ORAL_TABLET | Freq: Three times a day (TID) | ORAL | Status: AC
  Administered 2022-06-04 – 2022-06-06 (×9): 10 mg
  Filled 2022-06-04 (×9): qty 1

## 2022-06-04 MED ORDER — FENTANYL CITRATE PF 50 MCG/ML IJ SOSY: 50.0000 ug | PREFILLED_SYRINGE | INTRAMUSCULAR | Status: DC | PRN

## 2022-06-04 MED ORDER — FENTANYL CITRATE PF 50 MCG/ML IJ SOSY
50.0000 ug | PREFILLED_SYRINGE | INTRAMUSCULAR | Status: DC | PRN
  Administered 2022-06-04: 100 ug via INTRAVENOUS
  Filled 2022-06-04: qty 2

## 2022-06-04 MED ORDER — OXYCODONE HCL 5 MG PO TABS
5.0000 mg | ORAL_TABLET | Freq: Three times a day (TID) | ORAL | Status: DC
  Administered 2022-06-04 – 2022-06-06 (×7): 5 mg
  Filled 2022-06-04 (×8): qty 1

## 2022-06-04 NOTE — Progress Notes (Signed)
  NEUROSURGERY PROGRESS NOTE   Pt seen and examined. No issues overnight.  EXAM: Temp:  [97.6 F (36.4 C)-99.7 F (37.6 C)] 99.7 F (37.6 C) (04/20 0400) Pulse Rate:  [63-103] 69 (04/20 0800) Resp:  [15-25] 18 (04/20 0800) BP: (138-186)/(77-105) 159/99 (04/20 0800) SpO2:  [89 %-100 %] 100 % (04/20 0820) FiO2 (%):  [30 %] 30 % (04/20 0820) Weight:  [52.8 kg] 52.8 kg (04/20 0707) Intake/Output      04/19 0701 04/20 0700 04/20 0701 04/21 0700   I.V. (mL/kg) 2197.9 (38.3) 96.1 (1.8)   NG/GT 1080 45   IV Piggyback 200    Total Intake(mL/kg) 3477.9 (60.6) 141.1 (2.7)   Urine (mL/kg/hr) 4325 (3.1)    Stool 350    Total Output 4675    Net -1197.2 +141.1         Intubated, on low-dose precedex Breathing spontaneously on vent Pupils 2mm, reactive Following commands RUE/RLE, moves spontaneously and purposefully Per RN, was following some commands on left, currently not Wound c/d/I  LABS: Lab Results  Component Value Date   CREATININE 0.53 06/03/2022   BUN 7 06/03/2022   NA 136 06/04/2022   K 4.5 06/03/2022   CL 109 06/03/2022   CO2 22 06/03/2022   Lab Results  Component Value Date   WBC 15.9 (H) 06/04/2022   HGB 13.0 06/04/2022   HCT 38.9 06/04/2022   MCV 96.5 06/04/2022   PLT 224 06/04/2022     TCD: Date POD PCO2 HCT BP   MCA ACA PCA OPHT SIPH VERT Basilar  4/17,rs         Right  Left   41  59   -35  -37   47  40   -25  -28   -36       4/19 GC         Right  Left   77  73   -35  -45   11  49   19  19   48  20   *  *   *        IMPRESSION: - 37 y.o. female POD#4 with intraoperative aneurysm rupture during opening. Exam appears subtly improved, now with some left-sided movement.   PLAN: - With improved exam despite non-therapeutic serum Na, will decrease and discontinue hypertonic today - Continue Nimotop - Suspect her level of consciousness with improve in the short term, especially if able to discontinue sedatives.  Would therefore err on the side of attempting to extubate when appropriate per PCCM rather than early tracheostomy. - SBP goal <118mmHg - Cont PT/OT   Lisbeth Renshaw, MD [facility]

## 2022-06-04 NOTE — Procedures (Signed)
Extubation Procedure Note  Patient Details:   Name: Kristin Simmons DOB: [birth year] MRN: [id number]   Airway Documentation:    Vent end date: 06/04/22 Vent end time: 1446   Evaluation  O2 sats: stable throughout Complications: No apparent complications Patient did tolerate procedure well. Bilateral Breath Sounds: Clear, Diminished   Patient extubated per MD order & placed on 4L [place]. Patient is able to follow commands & cough, but not able to speak well at this time. Will continue to monitor.  Jacqulynn Cadet 06/04/2022, 2:52 PM

## 2022-06-04 NOTE — Progress Notes (Signed)
Pharmacy Antibiotic Note  Kristin Simmons is a 37 y.o. female admitted on 05/31/2022 with  aspiration pneumonia .  Pharmacy has been consulted for Unasyn dosing. Tm 99., WBC down 15.9, and renal function good. TA culture showing abundant GNR.  Plan: Unasyn 3 g IV q6h Monitor renal function, clinical progress, cultures/sensitivities F/U LOT and de-escalate as able  Height:  (162.6 cm) Weight: 52.8 kg (116 lb 6.5 oz) IBW/kg (Calculated) : 54.7  Temp (24hrs), Avg:98.4 F (36.9 C), Min:97.6 F (36.4 C), Max:99.7 F (37.6 C)  Recent Labs  Lab 05/31/22 1001 05/31/22 1048 06/01/22 0613 06/02/22 0500 06/03/22 0606  WBC  --   --  17.8* 20.2*  --   CREATININE 0.70 0.70 0.77 0.65 0.53    Estimated Creatinine Clearance: 82.6 mL/min (by C-G formula based on SCr of 0.53 mg/dL).    Allergies  Allergen Reactions   Latex Itching and Rash   Thank you for involving pharmacy in this patient's care.  Loura Back, PharmD, BCPS Clinical Pharmacist Clinical phone for 06/04/2022 is [phone] 06/04/2022 7:33 AM

## 2022-06-04 NOTE — Progress Notes (Signed)
NAME:  Kristin Simmons, MRN:  [id number], DOB:  1986/02/10, LOS: 4 ADMISSION DATE:  05/31/2022, CONSULTATION DATE:  4/16 REFERRING MD:  Dr. 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  She was referred to neurosurgery.  She had coil embolization of Rt ophthalmic aneurysm.  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  She remained on vent post op, and PCCM consulted to assist with management in ICU.  Pertinent  Medical History  Anemia, HTN, Anxiety, Asthma, Cervical cancer, Depression, Headaches  Significant Hospital Events: Including procedures, antibiotic start and stop dates in addition to other pertinent events   4/16 To OR, craniectomy and clipping of Rt MCA aneurysm, stayed on vent, start cardene gtt 4/18 CT head >> increased anterior and middle division Rt MCA territory edema with hemorrhagic transformation, increased mass effect with 4 mm leftward shift  Interim History / Subjective:  Remains intubated No acute events overnight Tried PSV trial 8/5 yesterday tolerated well She was restless off propofol and reduced fentanyl yesterday   Objective   Blood pressure (!) 157/100, pulse 93, temperature 99.7 F (37.6 C), temperature source Oral, resp. rate (!) 22, height  (1.626 m), weight 52.8 kg, last menstrual period 04/26/2022, SpO2 (!) 89 %.    Vent Mode: PRVC FiO2 (%):  [30 %] 30 % Set Rate:  [16 bmp] 16 bmp Vt Set:  [430 mL] 430 mL PEEP:  [5 cmH20] 5 cmH20 Pressure Support:  [8 cmH20] 8 cmH20 Plateau Pressure:  [18 cmH20-21 cmH20] 21 cmH20    Intake/Output Summary (Last 24 hours) at 06/04/2022 4098 Last data filed at 06/04/2022 0700 Gross per 24 hour  Intake 3477.85 ml  Output 4675 ml  Net -1197.15 ml   Filed Weights   05/31/22 0542 06/03/22 0705 06/04/22 0707  Weight: 52.2 kg 57.4 kg 52.8 kg    Examination:  General - sedated Eyes - pinoint pupils but reactive ENT - ETT in place Cardiac - regular rate/rhythm, no murmur Chest - course breath sounds, no wheezing Abdomen - soft, non tender, less distended, + bowel sounds Extremities - no cyanosis, clubbing, or edema Skin - no rashes Neuro - follows simple commands intermittently  Resolved Hospital Problem list     Assessment & Plan:   Compromised airway in setting of SAH Possible Aspiration pneumonia - pressure support wean as tolerated - defer extubation until neuro status more stable - goal SpO2 > 92% - f/u CXR intermittently - tracheal aspirate with gram negative rods, start unasyn for possible aspiration pneumonia  SAH with Rt MCA aneurysm clipping s/p decompressive craniectomy. - post op care, AEDs per neurosurgery - continue nimodipine - SBP goal <154mmHg  HTN. - wean cardene for goal SBP < 160 - hold outpt norvasc while on nimodipine - continue lopressor, triamterene, HCTZ  Sedation while on vent. - stop propofol and switch to precedex - stop fentanyl drip - PRN fentanyl ordered along with oxycodone  TID per tube  Hyperglycemia. - SSI  Abdominal Distention - KUB without obstruction - resume tube feeds and meds per tube - continue bowel regimen  Urinary  rentention - rentention order set placed, bethanacol ordered  Best Practice (right click and "Reselect all SmartList Selections" daily)   Diet/type: tubefeeds DVT prophylaxis: SCD GI prophylaxis: H2B Lines: N/A Foley:  Yes, and it is still needed Code Status:  full code Last date of multidisciplinary goals of care discussion [NA]  Labs       Latest Ref Rng & Units  06/04/2022    2:25 AM 06/03/2022    7:44 PM 06/03/2022   11:16 AM  CMP  Sodium 135 - 145 mmol/L 137  141  139        Latest Ref Rng & Units 06/02/2022    5:00 AM 06/01/2022    6:13 AM 05/31/2022    3:13 PM  CBC  WBC 4.0 - 10.5 K/uL 20.2  17.8    Hemoglobin 12.0 - 15.0 g/dL 95.2  84.1  32.4   Hematocrit 36.0 - 46.0 % 35.5  34.6  46.0   Platelets 150 - 400 K/uL 225  248      ABG    Component Value Date/Time   PHART 7.405 05/31/2022 1513   PCO2ART 27.9 (L) 05/31/2022 1513   PO2ART 239 (H) 05/31/2022 1513   HCO3 17.6 (L) 05/31/2022 1513   TCO2 18 (L) 05/31/2022 1513   ACIDBASEDEF 6.0 (H) 05/31/2022 1513   O2SAT 100 05/31/2022 1513    CBG (last 3)  Recent Labs    06/03/22 1917 06/03/22 2311 06/04/22 0313  GLUCAP 132* 114* 152*     Critical care time: 35 minutes   Melody Comas, MD [facility] Office: [phone]   See Amion for personal pager PCCM on call pager [phone] until 7pm. Please call Elink 7p-7a. [phone]

## 2022-06-04 NOTE — Progress Notes (Signed)
Physical Therapy Treatment Patient Details Name: Kristin Simmons MRN: [id number] DOB: 18-Mar-1985 Today's Date: 06/04/2022   History of Present Illness Pt is a 37yo female who was admitted for elective coiling of R MCA aneurysm however this was complicated by intraoperative rupture resulting in a R pterional craniectomy for clipping of MCA aneurysm. Placement of bone flap in subcutaneous abdominal pocket. PMH: anxiety, anemia, HTN, depression    PT Comments    Pt lethargic today, RN turned down sedation and pt's bed placed into chair position to increase arousal but pt remained lethargic. Pt's mother present, educated on ROM to BLE's and BUE's. Mother also taught retrograde massage to L hand for swelling. Pt repositioned in bed end of session. PT will continue to follow.   Recommendations for follow up therapy are one component of a multi-disciplinary discharge planning process, led by the attending physician.  Recommendations may be updated based on patient status, additional functional criteria and insurance authorization.  Follow Up Recommendations       Assistance Recommended at Discharge Frequent or constant Supervision/Assistance  Patient can return home with the following Two people to help with walking and/or transfers;A lot of help with bathing/dressing/bathroom;Assistance with cooking/housework;Direct supervision/assist for medications management;Direct supervision/assist for financial management;Assist for transportation;Help with stairs or ramp for entrance   Equipment Recommendations  Other (comment) (defer to post acute)    Recommendations for Other Services Rehab consult     Precautions / Restrictions Precautions Precautions: Fall;Other (comment) Precaution Comments: R craniectomy, no bone flap, bone flap placed in abdomen, ETT Restrictions Weight Bearing Restrictions: No     Mobility  Bed Mobility Overal bed mobility: Needs Assistance             General  bed mobility comments: pt's bed placed in chair position for increased alertness. Lights on, pt stimulated but pt not responsive. RN beginning vent wean today    Transfers                   General transfer comment: unable this date    Ambulation/Gait               General Gait Details: unable this date   Stairs             Wheelchair Mobility    Modified Rankin (Stroke Patients Only) Modified Rankin (Stroke Patients Only) Pre-Morbid Rankin Score: No symptoms Modified Rankin: Severe disability     Balance Overall balance assessment: Needs assistance Sitting-balance support: Feet supported, Bilateral upper extremity supported Sitting balance-Leahy Scale: Poor Sitting balance - Comments: full support of bed                                    Cognition Arousal/Alertness: Lethargic, Suspect due to medications Behavior During Therapy: Flat affect Overall Cognitive Status: Difficult to assess                                 General Comments: RN turned down sedation but pt remained very lethargic throughout        Exercises Other Exercises Other Exercises: mother taught PROM to LE's and UE's Other Exercises: mother taught retrograde massage to pt's L hand for swelling. Other Exercises: reinforced head position with pt's mother    General Comments General comments (skin integrity, edema, etc.): VSS      Pertinent Vitals/Pain Pain  Assessment Pain Assessment: Faces Faces Pain Scale: No hurt    Home Living                          Prior Function            PT Goals (current goals can now be found in the care plan section) Acute Rehab PT Goals Patient Stated Goal: none stated PT Goal Formulation: Patient unable to participate in goal setting Time For Goal Achievement: 06/15/22 Potential to Achieve Goals: Good Progress towards PT goals: Not progressing toward goals - comment (lethargy)     Frequency    Min 4X/week      PT Plan Current plan remains appropriate    Co-evaluation              AM-PAC PT "6 Clicks" Mobility   Outcome Measure  Help needed turning from your back to your side while in a flat bed without using bedrails?: Total Help needed moving from lying on your back to sitting on the side of a flat bed without using bedrails?: Total Help needed moving to and from a bed to a chair (including a wheelchair)?: Total Help needed standing up from a chair using your arms (e.g., wheelchair or bedside chair)?: Total Help needed to walk in hospital room?: Total Help needed climbing 3-5 steps with a railing? : Total 6 Click Score: 6    End of Session Equipment Utilized During Treatment: Oxygen (vent) Activity Tolerance: Patient tolerated treatment well Patient left: in bed;with call bell/phone within reach;with bed alarm set;with family/visitor present;with restraints reapplied Nurse Communication: Mobility status PT Visit Diagnosis: Unsteadiness on feet (R26.81);Hemiplegia and hemiparesis Hemiplegia - Right/Left: Left Hemiplegia - dominant/non-dominant: Non-dominant Hemiplegia - caused by: Other cerebrovascular disease     Time: 1205-1224 PT Time Calculation (min) (ACUTE ONLY): 19 min  Charges:  $Therapeutic Exercise: 8-22 mins                     Lyanne Co, PT  Acute Rehab Services Secure chat preferred Office [phone]    Lawana Chambers  06/04/2022, 2:10 PM

## 2022-06-05 DIAGNOSIS — Z9889 Other specified postprocedural states: Secondary | ICD-10-CM | POA: Diagnosis not present

## 2022-06-05 LAB — CBC
HCT: 28 % — ABNORMAL LOW (ref 36.0–46.0)
HCT: 36.9 % (ref 36.0–46.0)
HCT: 37 % (ref 36.0–46.0)
Hemoglobin: 9.7 g/dL — ABNORMAL LOW (ref 12.0–15.0)
Hemoglobin: 12.6 g/dL (ref 12.0–15.0)
Hemoglobin: 12.9 g/dL (ref 12.0–15.0)
MCH: 31.8 pg (ref 26.0–34.0)
MCH: 31.7 pg (ref 26.0–34.0)
MCH: 31.6 pg (ref 26.0–34.0)
MCHC: 34.6 g/dL (ref 30.0–36.0)
MCHC: 34.1 g/dL (ref 30.0–36.0)
MCHC: 34.9 g/dL (ref 30.0–36.0)
MCV: 91.8 fL (ref 80.0–100.0)
MCV: 92.9 fL (ref 80.0–100.0)
MCV: 90.7 fL (ref 80.0–100.0)
Platelets: 201 10*3/uL (ref 150–400)
Platelets: 259 10*3/uL (ref 150–400)
Platelets: 273 10*3/uL (ref 150–400)
RBC: 3.05 MIL/uL — ABNORMAL LOW (ref 3.87–5.11)
RBC: 3.97 MIL/uL (ref 3.87–5.11)
RBC: 4.08 MIL/uL (ref 3.87–5.11)
RDW: 14.6 % (ref 11.5–15.5)
RDW: 14.4 % (ref 11.5–15.5)
RDW: 13.9 % (ref 11.5–15.5)
WBC: 9.5 10*3/uL (ref 4.0–10.5)
WBC: 12.1 10*3/uL — ABNORMAL HIGH (ref 4.0–10.5)
WBC: 11.8 10*3/uL — ABNORMAL HIGH (ref 4.0–10.5)
nRBC: 0 % (ref 0.0–0.2)
nRBC: 0 % (ref 0.0–0.2)
nRBC: 0 % (ref 0.0–0.2)

## 2022-06-05 LAB — COMPREHENSIVE METABOLIC PANEL
ALT: 37 U/L (ref 0–44)
AST: 44 U/L — ABNORMAL HIGH (ref 15–41)
Albumin: 2.4 g/dL — ABNORMAL LOW (ref 3.5–5.0)
Alkaline Phosphatase: 55 U/L (ref 38–126)
Anion gap: 11 (ref 5–15)
BUN: 12 mg/dL (ref 6–20)
CO2: 24 mmol/L (ref 22–32)
Calcium: 8.8 mg/dL — ABNORMAL LOW (ref 8.9–10.3)
Chloride: 103 mmol/L (ref 98–111)
Creatinine, Ser: 0.71 mg/dL (ref 0.44–1.00)
GFR, Estimated: >60 mL/min (ref >60)
Glucose, Bld: 153 mg/dL — ABNORMAL HIGH (ref 70–99)
Potassium: 3.6 mmol/L (ref 3.5–5.1)
Sodium: 138 mmol/L (ref 135–145)
Total Bilirubin: 0.7 mg/dL (ref 0.3–1.2)
Total Protein: 6.6 g/dL (ref 6.5–8.1)

## 2022-06-05 LAB — BASIC METABOLIC PANEL
Anion gap: 16 — ABNORMAL HIGH (ref 5–15)
BUN: 11 mg/dL (ref 6–20)
CO2: 19 mmol/L — ABNORMAL LOW (ref 22–32)
Calcium: 6.3 mg/dL — CL (ref 8.9–10.3)
Chloride: 112 mmol/L — ABNORMAL HIGH (ref 98–111)
Creatinine, Ser: 1.24 mg/dL — ABNORMAL HIGH (ref 0.44–1.00)
GFR, Estimated: 59 mL/min — ABNORMAL LOW (ref >60)
Glucose, Bld: 113 mg/dL — ABNORMAL HIGH (ref 70–99)
Potassium: 2.6 mmol/L — CL (ref 3.5–5.1)
Sodium: 147 mmol/L — ABNORMAL HIGH (ref 135–145)

## 2022-06-05 LAB — GLUCOSE, CAPILLARY
Glucose-Capillary: 171 mg/dL — ABNORMAL HIGH (ref 70–99)
Glucose-Capillary: 129 mg/dL — ABNORMAL HIGH (ref 70–99)
Glucose-Capillary: 121 mg/dL — ABNORMAL HIGH (ref 70–99)
Glucose-Capillary: 103 mg/dL — ABNORMAL HIGH (ref 70–99)
Glucose-Capillary: 128 mg/dL — ABNORMAL HIGH (ref 70–99)
Glucose-Capillary: 139 mg/dL — ABNORMAL HIGH (ref 70–99)

## 2022-06-05 LAB — CULTURE, RESPIRATORY W GRAM STAIN

## 2022-06-05 MED ORDER — CLONAZEPAM 0.1 MG/ML ORAL SUSPENSION: 0.5000 mg | Freq: Two times a day (BID) | ORAL | Status: DC

## 2022-06-05 MED ORDER — ORAL CARE MOUTH RINSE: 15.0000 mL | OROMUCOSAL | Status: DC | PRN

## 2022-06-05 MED ORDER — CARVEDILOL 12.5 MG PO TABS
25.0000 mg | ORAL_TABLET | Freq: Two times a day (BID) | ORAL | Status: DC
  Administered 2022-06-05 – 2022-06-06 (×4): 25 mg
  Filled 2022-06-05 (×4): qty 2

## 2022-06-05 MED ORDER — SODIUM CHLORIDE 0.9 % IV SOLN
2.0000 g | Freq: Three times a day (TID) | INTRAVENOUS | Status: AC
  Administered 2022-06-05 – 2022-06-11 (×20): 2 g via INTRAVENOUS
  Filled 2022-06-05 (×20): qty 12.5

## 2022-06-05 MED ORDER — CLONAZEPAM 0.5 MG PO TABS
0.5000 mg | ORAL_TABLET | Freq: Two times a day (BID) | ORAL | Status: DC
  Administered 2022-06-05 (×2): 0.5 mg
  Filled 2022-06-05 (×2): qty 1

## 2022-06-05 MED ORDER — BANATROL TF EN LIQD
60.0000 mL | Freq: Two times a day (BID) | ENTERAL | Status: DC
  Administered 2022-06-05 – 2022-06-10 (×11): 60 mL
  Filled 2022-06-05 (×11): qty 60

## 2022-06-05 MED ORDER — BUTALBITAL-APAP-CAFFEINE 50-325-40 MG PO TABS
1.0000 | ORAL_TABLET | Freq: Three times a day (TID) | ORAL | Status: DC | PRN
  Administered 2022-06-05 (×2): 1 via ORAL
  Filled 2022-06-05 (×2): qty 1

## 2022-06-05 MED ORDER — ORAL CARE MOUTH RINSE
15.0000 mL | OROMUCOSAL | Status: DC
  Administered 2022-06-05 – 2022-06-21 (×63): 15 mL via OROMUCOSAL

## 2022-06-05 NOTE — Progress Notes (Signed)
NAME:  Kristin Simmons, MRN:  [id number], DOB:  04-01-1985, LOS: 5 ADMISSION DATE:  05/31/2022, CONSULTATION DATE:  4/16 REFERRING MD:  Dr. 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  She was referred to neurosurgery.  She had coil embolization of Rt ophthalmic aneurysm.  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  She remained on vent post op, and PCCM consulted to assist with management in ICU.  Pertinent  Medical History  Anemia, HTN, Anxiety, Asthma, Cervical cancer, Depression, Headaches  Significant Hospital Events: Including procedures, antibiotic start and stop dates in addition to other pertinent events   4/16 To OR, craniectomy and clipping of Rt MCA aneurysm, stayed on vent, start cardene gtt 4/18 CT head >> increased anterior and middle division Rt MCA territory edema with hemorrhagic transformation, increased mass effect with 4 mm leftward shift 4/20 extubated, tracheal aspirate showing gram negative rods, Unasyn started  Interim History / Subjective:   Extubated yesterday, no acute events overnight She has good cough per nursing and increased suctioning requirements Ongoing increased stool output per nursing On precedex for agitation  Objective   Blood pressure 135/78, pulse 67, temperature 97.8 F (36.6 C), temperature source Oral, resp. rate 19, height  (1.626 m), weight 52.8 kg, last menstrual period 04/26/2022, SpO2 98 %.    Vent Mode: CPAP;PSV FiO2 (%):  [30 %] 30 % Set Rate:  [16 bmp] 16 bmp Vt Set:  [430 mL] 430 mL PEEP:  [5  cmH20] 5 cmH20 Pressure Support:  [5 cmH20-8 cmH20] 8 cmH20 Plateau Pressure:  [22 cmH20] 22 cmH20   Intake/Output Summary (Last 24 hours) at 06/05/2022 0719 Last data filed at 06/05/2022 0600 Gross per 24 hour  Intake 2270.67 ml  Output 2900 ml  Net -629.33 ml   Filed Weights   05/31/22 0542 06/03/22 0705 06/04/22 0707  Weight: 52.2 kg 57.4 kg 52.8 kg    Examination:  General - sleeping Eyes - pupils equal and reactive ENT - moist mucous membranes, core trak in place Cardiac - regular rate/rhythm, no murmur Chest - course breath sounds, no wheezing Abdomen - soft, non tender, less distended, + bowel sounds Extremities - no cyanosis, clubbing, or edema Skin - no rashes Neuro - follows simple commands intermittently  Resolved Hospital Problem list     Assessment & Plan:   Compromised airway in setting of SAH Hemophilus Influenza pneumonia - goal SpO2 > 92% - tracheal aspirate with hemophilus influenza  - Continue unasyn, day 3/5   SAH with Rt MCA aneurysm clipping s/p decompressive craniectomy. - post op care, AEDs per neurosurgery - continue nimodipine - SBP goal <177mmHg  HTN. - goal SBP < 160 - hold outpt norvasc while on nimodipine - continue triamterene, HCTZ - switch metoprolol  to carvedilol  BID per tube  Agitation - will stop precedex today - start 0.5mg  klonipin twice daily per tube - continue oxycodone  TID per tube  Hyperglycemia. - SSI  Nutrition Abdominal Distention Increased stool output - KUB without obstruction - continue tube feeds and meds per tube - hold bowel regimen -  add bentrol fiber supplement for increased stool output  Urinary rentention - rentention order set placed, continue bethanacol   Best Practice (right click and "Reselect all SmartList Selections" daily)   Diet/type: tubefeeds DVT prophylaxis: SCD GI prophylaxis: N/A Lines: N/A Foley:  Yes, and it is still needed Code Status:  full code Last date  of multidisciplinary goals of care discussion [updated mother at bedside 4/20]  Labs       Latest Ref Rng & Units 06/04/2022    7:43 AM 06/04/2022    2:25 AM 06/03/2022    7:44 PM  CMP  Sodium 135 - 145 mmol/L 136  137  141        Latest Ref Rng & Units 06/04/2022    7:43 AM 06/02/2022    5:00 AM 06/01/2022    6:13 AM  CBC  WBC 4.0 - 10.5 K/uL 15.9  20.2  17.8   Hemoglobin 12.0 - 15.0 g/dL 40.9  81.1  91.4   Hematocrit 36.0 - 46.0 % 38.9  35.5  34.6   Platelets 150 - 400 K/uL 224  225  248     ABG    Component Value Date/Time   PHART 7.405 05/31/2022 1513   PCO2ART 27.9 (L) 05/31/2022 1513   PO2ART 239 (H) 05/31/2022 1513   HCO3 17.6 (L) 05/31/2022 1513   TCO2 18 (L) 05/31/2022 1513   ACIDBASEDEF 6.0 (H) 05/31/2022 1513   O2SAT 100 05/31/2022 1513    CBG (last 3)  Recent Labs    06/04/22 1947 06/04/22 2330 06/05/22 0305  GLUCAP 142* 151* 171*     Critical care time: 33 minutes   Melody Comas, MD [facility] Office: [phone]   See Amion for personal pager PCCM on call pager [phone] until 7pm. Please call Elink 7p-7a. [phone]

## 2022-06-05 NOTE — Progress Notes (Signed)
Repeating labs to confirm the abnormalities.  Melody Comas, MD [facility] Office: [phone]   See Amion for personal pager PCCM on call pager [phone] until 7pm. Please call Elink 7p-7a. [phone]

## 2022-06-05 NOTE — Progress Notes (Signed)
SLP Cancellation Note  Patient Details Name: Kristin Simmons MRN: [id number] DOB: 1985/06/01   Cancelled treatment:       Reason Eval/Treat Not Completed: Fatigue/lethargy limiting ability to participate (Pt was approached for SLP evaluations, but she was asleep and unable to demonstrate an adequate level of alertness to participate despite verbal and tactile stimulation from SLP and MD. SLP will follow up on subsequent date.)   I. Vear Clock, MS, CCC-SLP Acute Rehabilitation Services Office number [phone]  Scheryl Marten 06/05/2022, 10:15 AM

## 2022-06-05 NOTE — Progress Notes (Signed)
Patient extubated last night.  She is somnolent today but is protecting her airway.  She is moving her right side well.  Still with minimal movement on the left.  Her wound is clean and dry.  Her scalp is tense.  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  Patient still with significant right brain swelling.  Continue supportive efforts.  Watch closely with regard to airway protection.  Hopefully she will progress to the point where she does not require reintubation.

## 2022-06-06 ENCOUNTER — Inpatient Hospital Stay (HOSPITAL_COMMUNITY): Payer: Medicaid Other

## 2022-06-06 DIAGNOSIS — I609 Nontraumatic subarachnoid hemorrhage, unspecified: Secondary | ICD-10-CM | POA: Diagnosis not present

## 2022-06-06 DIAGNOSIS — Z9889 Other specified postprocedural states: Secondary | ICD-10-CM | POA: Diagnosis not present

## 2022-06-06 LAB — BASIC METABOLIC PANEL
Anion gap: 9 (ref 5–15)
BUN: 19 mg/dL (ref 6–20)
CO2: 25 mmol/L (ref 22–32)
Calcium: 9.1 mg/dL (ref 8.9–10.3)
Chloride: 96 mmol/L — ABNORMAL LOW (ref 98–111)
Creatinine, Ser: 0.59 mg/dL (ref 0.44–1.00)
GFR, Estimated: >60 mL/min (ref >60)
Glucose, Bld: 153 mg/dL — ABNORMAL HIGH (ref 70–99)
Potassium: 3.8 mmol/L (ref 3.5–5.1)
Sodium: 130 mmol/L — ABNORMAL LOW (ref 135–145)

## 2022-06-06 LAB — CBC
HCT: 37.1 % (ref 36.0–46.0)
Hemoglobin: 13.1 g/dL (ref 12.0–15.0)
MCH: 31.5 pg (ref 26.0–34.0)
MCHC: 35.3 g/dL (ref 30.0–36.0)
MCV: 89.2 fL (ref 80.0–100.0)
Platelets: 331 10*3/uL (ref 150–400)
RBC: 4.16 MIL/uL (ref 3.87–5.11)
RDW: 13.9 % (ref 11.5–15.5)
WBC: 14 10*3/uL — ABNORMAL HIGH (ref 4.0–10.5)
nRBC: 0 % (ref 0.0–0.2)

## 2022-06-06 LAB — GLUCOSE, CAPILLARY
Glucose-Capillary: 149 mg/dL — ABNORMAL HIGH (ref 70–99)
Glucose-Capillary: 156 mg/dL — ABNORMAL HIGH (ref 70–99)
Glucose-Capillary: 97 mg/dL (ref 70–99)
Glucose-Capillary: 160 mg/dL — ABNORMAL HIGH (ref 70–99)
Glucose-Capillary: 179 mg/dL — ABNORMAL HIGH (ref 70–99)
Glucose-Capillary: 156 mg/dL — ABNORMAL HIGH (ref 70–99)

## 2022-06-06 LAB — PHOSPHORUS: Phosphorus: 4.3 mg/dL (ref 2.5–4.6)

## 2022-06-06 LAB — MAGNESIUM: Magnesium: 1.9 mg/dL (ref 1.7–2.4)

## 2022-06-06 MED ORDER — FENTANYL CITRATE PF 50 MCG/ML IJ SOSY
25.0000 ug | PREFILLED_SYRINGE | Freq: Once | INTRAMUSCULAR | Status: AC
  Administered 2022-06-06: 25 ug via INTRAVENOUS
  Filled 2022-06-06: qty 1

## 2022-06-06 MED ORDER — OXYCODONE HCL 5 MG PO TABS
5.0000 mg | ORAL_TABLET | Freq: Four times a day (QID) | ORAL | Status: DC | PRN
  Administered 2022-06-06 – 2022-06-21 (×37): 5 mg
  Filled 2022-06-06 (×40): qty 1

## 2022-06-06 MED ORDER — CLONAZEPAM 0.5 MG PO TABS
0.5000 mg | ORAL_TABLET | Freq: Two times a day (BID) | ORAL | Status: DC | PRN
  Administered 2022-06-06 – 2022-06-21 (×9): 0.5 mg
  Filled 2022-06-06 (×10): qty 1

## 2022-06-06 NOTE — Progress Notes (Signed)
Occupational Therapy Treatment Patient Details Name: Kristin Simmons MRN: [id number] DOB: 1985/06/08 Today's Date: 06/06/2022   History of present illness Pt is a 37yo female who was admitted for elective coiling of R MCA aneurysm however this was complicated by intraoperative rupture resulting in a R pterional craniectomy for clipping of MCA aneurysm. Placement of bone flap in subcutaneous abdominal pocket. PMH: anxiety, anemia, HTN, depression   OT comments  Patient notably more lethargic in comparison to last Friday treatment session. Patient requiring increased time to follow all commands and inconsistent in actions and responses. Patient with garbled speech, that was occasionally intelligible but infrequent verbal responses noted. Patient requiring max A of 2 to transition EOB, and increased assist to maintain sitting balance with significant neck flexion in sitting, and unable to correct independently. Patient able to brush teeth with min A in session prior to demonstrating increased fatigue and requiring assist to return to supine. OT will continue to follow, recommendation remains appropriate.    Recommendations for follow up therapy are one component of a multi-disciplinary discharge planning process, led by the attending physician.  Recommendations may be updated based on patient status, additional functional criteria and insurance authorization.    Assistance Recommended at Discharge Frequent or constant Supervision/Assistance  Patient can return home with the following  Two people to help with walking and/or transfers;Assistance with cooking/housework;Assistance with feeding;Direct supervision/assist for medications management;Direct supervision/assist for financial management;Assist for transportation;Help with stairs or ramp for entrance;A lot of help with bathing/dressing/bathroom   Equipment Recommendations  Other (comment) (Will continue to assess)    Recommendations for Other  Services      Precautions / Restrictions Precautions Precautions: Fall;Other (comment) Precaution Comments: R craniectomy, no bone flap, bone flap placed in abdomen, R mitt Restrictions Weight Bearing Restrictions: No       Mobility Bed Mobility Overal bed mobility: Needs Assistance Bed Mobility: Rolling, Supine to Sit, Sit to Supine Rolling: Max assist, +2 for physical assistance (due to fatigue)   Supine to sit: Max assist, +2 for physical assistance Sit to supine: Max assist, +2 for physical assistance   General bed mobility comments: pt needing maxA of 2 for all bed moblity this morning due to poor initiation and lethargy. did attempt to return legs to bed while being assited to sitting position    Transfers Overall transfer level: Needs assistance Equipment used: 2 person hand held assist Transfers: Sit to/from Stand Sit to Stand: Total assist, Max assist, +2 safety/equipment           General transfer comment: initially pt with little effort and was totalA to attempt sit-stand, after max encouragement, pt able to rise to standing with maxA of 2 and better initiation with RLE. needed assist to prevent L knee from buckling, no attempt to hold with LUE     Balance Overall balance assessment: Needs assistance Sitting-balance support: Feet unsupported, No upper extremity supported Sitting balance-Leahy Scale: Poor Sitting balance - Comments: pt with some attempt to correct, but LOB in any direction with sitting EOB and support removed Postural control: Posterior lean Standing balance support: Bilateral upper extremity supported, During functional activity Standing balance-Leahy Scale: Poor Standing balance comment: dependent on modA of 2 to maintain static stance                           ADL either performed or assessed with clinical judgement   ADL Overall ADL's : Needs assistance/impaired Eating/Feeding: NPO  Grooming: Minimal assistance Grooming  Details (indicate cue type and reason): patient able to brush teeth, no perseverative movements noted, and able to brush teeth on both sides of her mouth                               General ADL Comments: Patient more lethargic in session, needing increased assist for balance and increased time and cues to follow all commands    Extremity/Trunk Assessment Upper Extremity Assessment LUE Deficits / Details: decreased movement in LUE noted to date            Vision   Additional Comments: Patient opening eyes more in session, but unable to visually attend to complete full assessment   Perception     Praxis      Cognition Arousal/Alertness: Lethargic Behavior During Therapy: Flat affect Overall Cognitive Status: Impaired/Different from baseline                                 General Comments: pt more lethargic this session, follows command to R extremities with increased cues/stimulaiton, but not with L extremities at this time. pt needing max stimulation to give yes/no nods and attempt verbalization. speaks softly and is difficult to understand        Exercises      Shoulder Instructions       General Comments VSS on RA    Pertinent Vitals/ Pain       Pain Assessment Pain Assessment: 0-10 Pain Score: 9  Pain Location: headache, neck, and bottom Pain Descriptors / Indicators: Headache, Discomfort Pain Intervention(s): Monitored during session, Limited activity within patient's tolerance, Repositioned, Premedicated before session  Home Living     Available Help at Discharge: Family;Available 24 hours/day Type of Home: House (townhome)                              Lives With: Family    Prior Functioning/Environment              Frequency  Min 2X/week        Progress Toward Goals  OT Goals(current goals can now be found in the care plan section)  Progress towards OT goals: Progressing toward goals  Acute Rehab  OT Goals Patient Stated Goal: unable to state OT Goal Formulation: Patient unable to participate in goal setting Time For Goal Achievement: 06/15/22 Potential to Achieve Goals: Good  Plan Discharge plan remains appropriate    Co-evaluation      Reason for Co-Treatment: Complexity of the patient's impairments (multi-system involvement);Necessary to address cognition/behavior during functional activity;For patient/therapist safety;To address functional/ADL transfers PT goals addressed during session: Mobility/safety with mobility;Balance        AM-PAC OT "6 Clicks" Daily Activity     Outcome Measure   Help from another person eating meals?: Total Help from another person taking care of personal grooming?: A Little Help from another person toileting, which includes using toliet, bedpan, or urinal?: Total Help from another person bathing (including washing, rinsing, drying)?: A Lot Help from another person to put on and taking off regular upper body clothing?: A Lot Help from another person to put on and taking off regular lower body clothing?: Total 6 Click Score: 10    End of Session Equipment Utilized During Treatment: Gait belt  OT Visit Diagnosis:  Muscle weakness (generalized) (M62.81);Other abnormalities of gait and mobility (R26.89);Unsteadiness on feet (R26.81);Other symptoms and signs involving cognitive function;Hemiplegia and hemiparesis Hemiplegia - Right/Left: Left Hemiplegia - dominant/non-dominant: Non-Dominant Hemiplegia - 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 , OTR/L Acute Rehabilitation Services [phone]   Cherlyn Cushing 06/06/2022, 1:04 PM

## 2022-06-06 NOTE — Progress Notes (Signed)
Pt's CBC recollected at 2030 for verification d/t previous two readings being significantly different.

## 2022-06-06 NOTE — Progress Notes (Signed)
  NEUROSURGERY PROGRESS NOTE   Pt seen and examined. No issues overnight.  EXAM: Temp:  [97.6 F (36.4 C)-99.7 F (37.6 C)] 99.7 F (37.6 C) (04/20 0400) Pulse Rate:  [63-103] 69 (04/20 0800) Resp:  [15-25] 18 (04/20 0800) BP: (138-186)/(77-105) 159/99 (04/20 0800) SpO2:  [89 %-100 %] 100 % (04/20 0820) FiO2 (%):  [30 %] 30 % (04/20 0820) Weight:  [52.8 kg] 52.8 kg (04/20 0707) Intake/Output      04/19 0701 04/20 0700 04/20 0701 04/21 0700   I.V. (mL/kg) 2197.9 (38.3) 96.1 (1.8)   NG/GT 1080 45   IV Piggyback 200    Total Intake(mL/kg) 3477.9 (60.6) 141.1 (2.7)   Urine (mL/kg/hr) 4325 (3.1)    Stool 350    Total Output 4675    Net -1197.2 +141.1         Extubated, somnolent No eye opening Pupils 2mm, reactive Following commands RUE/RLE, moves spontaneously and purposefully Per RN, was following some commands on left, intermittently yesterday Wound c/d/I  LABS: Lab Results  Component Value Date   CREATININE 0.53 06/03/2022   BUN 7 06/03/2022   NA 136 06/04/2022   K 4.5 06/03/2022   CL 109 06/03/2022   CO2 22 06/03/2022   Lab Results  Component Value Date   WBC 15.9 (H) 06/04/2022   HGB 13.0 06/04/2022   HCT 38.9 06/04/2022   MCV 96.5 06/04/2022   PLT 224 06/04/2022     TCD: Date POD PCO2 HCT BP   MCA ACA PCA OPHT SIPH VERT Basilar  4/17,rs         Right  Left   41  59   -35  -37   47  40   -25  -28   -36       4/19 GC         Right  Left   77  73   -35  -45   11  49   19  19   48  20   *  *   *        IMPRESSION: - 37 y.o. female POD#6 with intraoperative aneurysm rupture during opening. Exam appears subtly improved, now with some left-sided movement although it appears her exam waxes and wanes  PLAN: - With waxing/waning exam I will order LT EEG - Continue Nimotop - SBP goal <17mmHg - Cont PT/OT   Lisbeth Renshaw, MD [facility]

## 2022-06-06 NOTE — Progress Notes (Signed)
Transcranial Doppler   Date POD PCO2 HCT BP   MCA ACA PCA OPHT SIPH VERT Basilar  4/17,rs         Right  Left   41  59   -35  -37   47  40   -25  -28   -36       4/19 GC         Right  Left   77  73   -35  -45   11  49   19  19   48  20   *  *   *       4/22  [facility]       139/86  Right  Left    83   118    -27   *    19   *    *   *    *   *    -26   -39    -76                   Right  Left                                                                 Right  Left                                                               Right  Left                                                               Right  Left                                                       MCA = Middle Cerebral Artery      OPHT = Opthalmic Artery     BASILAR = Basilar Artery   ACA = Anterior Cerebral Artery     SIPH = Carotid Siphon PCA = Posterior Cerebral Artery   VERT = Verterbral Artery                    Normal MCA = 62+\-12 ACA = 50+\-12 PCA = 42+\-23    * - Unable to insonate   Lindegaard Ratio - Right  2.76 , Left 4.21  Results can be found under chart review under CV PROC. 06/06/2022 1:49 PM  RVT, RDMS

## 2022-06-06 NOTE — Progress Notes (Signed)
Noted patient had 4 pauses in heart rate in the space of 10 minutes with longest pause around 2.5 seconds. Notified NP on call.

## 2022-06-06 NOTE — Progress Notes (Signed)
Nutrition Follow-up  DOCUMENTATION CODES:   Not applicable  INTERVENTION:   Tube feeding via Cortrak tube: Osmolite 1.5 @ 45 ml/hr (1080 ml per day) Prosource TF20 60 ml daily  Provides 1700 kcal, 87 gm protein, 820 ml free water daily  Continue 100 mg thiamine daily per tube and 1 mg folic acid daily per tube   Continue Banatrol Pt with diarrhea due to nimotop    NUTRITION DIAGNOSIS:   Inadequate oral intake related to inability to eat as evidenced by NPO status. Ongoing.   GOAL:   Patient will meet greater than or equal to 90% of their needs Met with TF at goal  MONITOR:   TF tolerance  REASON FOR ASSESSMENT:   Consult Enteral/tube feeding initiation and management  ASSESSMENT:   Pt with PMH of current smoker, ETOH use - drinks 2 beers per day, anemia, HTN, anxiety, asthma, cervial cancer, depression, and headaches admitted with R carotid aneurysm s/p coil embolization. 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   Pt discussed during ICU rounds and with RN.  Pt seen with RN at bedside. Pt sleeping soundly. Per RN pt has been unable to have any conversations with her as she has been too lethargic.  Noted diarrhea however pt is on nimotop known to cause diarrhea, miralax and senokot held  04/16 - s/p craniectomy and clipping of R MCA aneurysm  04/17 - s/p cortrak tube placement; per xray tip in body/antral region of the stomach  04/20 - extubated  Medications reviewed and include: banatrol, folic acid, SSI, nimotop, miralax, senokot-s, thiamine   Labs reviewed: Phos: 3.8 --> 2.5 CBG's: 103-156    Diet Order:   Diet Order     None       EDUCATION NEEDS:   Not appropriate for education at this time  Skin:  Skin Assessment: Skin Integrity Issues: Skin Integrity Issues:: Incisions Incisions: head and abd  Last BM:  750 ml via FMS, pt on nimotop  Height:   Ht Readings from Last 1  Encounters:  05/31/22  (1.626 m)    Weight:   Wt Readings from Last 1 Encounters:  06/06/22 50.4 kg    BMI:  Body mass index is 19.07 kg/m.  Estimated Nutritional Needs:   Kcal:  1700-1900  Protein:  75-90 grams  Fluid:  >1.7 L/day  Cammy Copa., RD, LDN, CNSC See AMiON for contact information

## 2022-06-06 NOTE — Progress Notes (Signed)
LTM EEG hooked up and running - no initial skin breakdown - push button tested - [facility] monitoring.  

## 2022-06-06 NOTE — Evaluation (Addendum)
Speech Language Pathology Evaluation Patient Details Name: Kristin Simmons MRN: [id number] DOB: 1985-07-28 Today's Date: 06/06/2022 Time: 4098-1191 SLP Time Calculation (min) (ACUTE ONLY): 30 min  Problem List:  Patient Active Problem List   Diagnosis Date Noted   Brain bleed 06/02/2022   Status post craniotomy 05/31/2022   Subarachnoid hemorrhage 05/31/2022   Cerebral aneurysm 03/08/2022   Aneurysm, cerebral, nonruptured 03/08/2022   Other spondylosis with radiculopathy, cervical region 04/15/2021   PROM (premature rupture of membranes) 03/26/2018   Postpartum hypertension 03/26/2018   Trichomoniasis 12/05/2017   High risk social situation  12/05/2017   Supervision of normal pregnancy 10/19/2017   History of cocaine use 10/19/2017   Adjustment disorder with depressed mood 09/13/2017   Anxiety disorder, unspecified 09/12/2017   Abnormal Pap smear of cervix 01/18/2016   Smoker 01/13/2016   Depression 01/13/2016   Alcohol use affecting pregnancy, first trimester 01/13/2016   History of preterm delivery, currently pregnant 01/13/2016   Underweight 01/13/2016   Past Medical History:  Past Medical History:  Diagnosis Date   Anemia    Anxiety    Arthritis    Asthma    Cancer    cervical   Depression    Headache    Hypertension    She was taking her mother's blood pressure meds but states she doesn't need bp meds now.   Vaginal Pap smear, abnormal    Past Surgical History:  Past Surgical History:  Procedure Laterality Date   CRANIOTOMY Right 05/31/2022   Procedure: RIGHT PTERIONAL CRANIOTOMY FOR CLIPPING OF MIDDLE CEREBRAL ARTERY  ANEURYSM WITH PLACEMENT OF BONEFLAP IN ABDOMEN;  Surgeon: Kristin Renshaw, MD;  Location: [facility] OR;  Service: Neurosurgery;  Laterality: Right;   IR 3D INDEPENDENT WKST  03/08/2022   IR ANGIO INTRA EXTRACRAN SEL INTERNAL CAROTID BILAT MOD SED  03/08/2022   IR ANGIO VERTEBRAL SEL VERTEBRAL BILAT MOD SED  03/08/2022   IR ANGIOGRAM FOLLOW UP STUDY   03/08/2022   IR ANGIOGRAM FOLLOW UP STUDY  03/08/2022   IR ANGIOGRAM FOLLOW UP STUDY  03/08/2022   IR NEURO EACH ADD'L AFTER BASIC UNI RIGHT (MS)  03/08/2022   IR TRANSCATH/EMBOLIZ  03/08/2022   NO PAST SURGERIES     RADIOLOGY WITH ANESTHESIA N/A 03/08/2022   Procedure: Diagnostic angiogram, Possible Coil Embolization of Aneurysm;  Surgeon: Kristin Renshaw, MD;  Location: [facility] OR;  Service: Radiology;  Laterality: N/A;   TUBAL LIGATION Bilateral 03/27/2018   Procedure: POST PARTUM TUBAL LIGATION;  Surgeon: Levie Heritage, DO;  Location: [facility];  Service: Gynecology;  Laterality: Bilateral;   HPI:  Pt is a 37 y/o female was 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 She remained on vent post op. ETT 4/16-4/20. Placement of bone flap in subcutaneous abdominal pocket. PMH: anxiety, anemia, HTN, depression.   Assessment / Plan / Recommendation Clinical Impression  Kristin Simmons participated in limited speech/language/cognitive assessment in conjunction with swallow evaluation and OT/PT treatment.  She was quite lethargic, but able to rouse for brief periods to answer basic questions about self (age, name, pain level).  Speech was fluent but poorly intelligible due to low volume and drowsiness. There was minimal initiation of verbaliziations today. She was able to sustain attention to tooth-brushing task and follow simple commands. She was able to demonstrate some elements of executive functioning, showing 3 then 4 fingers to  communicate her age and ranking her pain at a 9/10. SLP will follow for basic cognition/swallowing. Pt will benefit from intensive rehab at next level of care.    SLP Assessment  SLP Recommendation/Assessment: Patient needs continued Speech Lanaguage Pathology Services SLP Visit Diagnosis: Cognitive communication deficit (R41.841)    Recommendations for follow up therapy are one component of a multi-disciplinary discharge planning process, led by the attending physician.  Recommendations may be updated based on patient status, additional functional criteria and insurance authorization.    Follow Up Recommendations  Acute inpatient rehab (3hours/day)    Assistance Recommended at Discharge  Frequent or constant Supervision/Assistance  Functional Status Assessment Patient has had a recent decline in their functional status and demonstrates the ability to make significant improvements in function in a reasonable and predictable amount of time.  Frequency and Duration min 3x week  2 weeks      SLP Evaluation Cognition  Overall Cognitive Status: Impaired/Different from baseline Arousal/Alertness: Lethargic Orientation Level: Oriented to person Attention: Sustained Sustained Attention: Impaired Sustained Attention Impairment: Functional basic;Verbal basic Awareness: Impaired Awareness Impairment: Intellectual impairment       Comprehension  Auditory Comprehension Commands: Impaired One Step Basic Commands: 75-100% accurate Conversation: Simple Visual Recognition/Discrimination Discrimination: Not tested Reading Comprehension Reading Status: Not tested    Expression Expression Primary Mode of Expression: Verbal Verbal Expression Overall Verbal Expression: Impaired Initiation: Impaired Level of Generative/Spontaneous Verbalization: Phrase Written Expression Written Expression: Not tested   Oral / Motor  Oral Motor/Sensory Function Overall Oral Motor/Sensory Function:  (not  assessed) Motor Speech Overall Motor Speech: Impaired Phonation: Low vocal intensity            Kristin Simmons 06/06/2022, 10:25 AM Kristin Folks L. Samson Frederic, MA CCC/SLP Clinical Specialist - Acute Care SLP Acute Rehabilitation Services Office number [phone]

## 2022-06-06 NOTE — Progress Notes (Signed)
Physical Therapy Treatment Patient Details Name: Kristin Simmons MRN: [id number] DOB: [birth year] Today's Date: 06/06/2022   History of Present Illness Pt is a 37yo female who was admitted for elective coiling of R MCA aneurysm however this was complicated by intraoperative rupture resulting in a R pterional craniectomy for clipping of MCA aneurysm. Placement of bone flap in subcutaneous abdominal pocket. PMH: anxiety, anemia, HTN, depression    PT Comments    The pt was received in bed, more lethargic this morning compared to last Friday. The pt was assisted to sitting EOB, but required maxA of 2 this morning due to lethargy. Once seated EOB, the pt needed maxA to maintain sitting balance due to L lateral lean, but would lose balance in any direction if assistance lessened. Pt maintains significant forward flexion at neck and was unable to lift head against gravity despite max cues and attempt to assist. Pt with slight improvement in alertness in sitting, answered yes/no questions inconsistently with nods and attempted verbalizations intermittently (but is difficult to understand). Pt with slight decrease in command following with L extremities this morning compared to prior sessions, will continue to assess and progress OOB mobility as able. Recommendations remain appropriate.     Recommendations for follow up therapy are one component of a multi-disciplinary discharge planning process, led by the attending physician.  Recommendations may be updated based on patient status, additional functional criteria and insurance authorization.  Follow Up Recommendations       Assistance Recommended at Discharge Frequent or constant Supervision/Assistance  Patient can return home with the following Two people to help with walking and/or transfers;A lot of help with bathing/dressing/bathroom;Assistance with cooking/housework;Direct supervision/assist for medications management;Direct supervision/assist for  financial management;Assist for transportation;Help with stairs or ramp for entrance   Equipment Recommendations  Other (comment) (defer to post acute)    Recommendations for Other Services       Precautions / Restrictions Precautions Precautions: Fall;Other (comment) Precaution Comments: R craniectomy, no bone flap, bone flap placed in abdomen, R mitt Restrictions Weight Bearing Restrictions: No     Mobility  Bed Mobility Overal bed mobility: Needs Assistance Bed Mobility: Rolling, Supine to Sit, Sit to Supine Rolling: Max assist, +2 for physical assistance (due to fatigue)   Supine to sit: Max assist, +2 for physical assistance Sit to supine: Max assist, +2 for physical assistance   General bed mobility comments: pt needing maxA of 2 for all bed moblity this morning due to poor initiation and lethargy. did attempt to return legs to bed while being assited to sitting position    Transfers Overall transfer level: Needs assistance Equipment used: 2 person hand held assist Transfers: Sit to/from Stand Sit to Stand: Total assist, Max assist, +2 safety/equipment           General transfer comment: initially pt with little effort and was totalA to attempt sit-stand, after max encouragement, pt able to rise to standing with maxA of 2 and better initiation with RLE. needed assist to prevent L knee from buckling, no attempt to hold with LUE    Ambulation/Gait               General Gait Details: unable this date    Modified Rankin (Stroke Patients Only) Modified Rankin (Stroke Patients Only) Pre-Morbid Rankin Score: No symptoms Modified Rankin: Severe disability     Balance Overall balance assessment: Needs assistance Sitting-balance support: Feet unsupported, No upper extremity supported Sitting balance-Leahy Scale: Poor Sitting balance - Comments: pt with  some attempt to correct, but LOB in any direction with sitting EOB and support removed Postural control:  Posterior lean Standing balance support: Bilateral upper extremity supported, During functional activity Standing balance-Leahy Scale: Poor Standing balance comment: dependent on modA of 2 to maintain static stance                            Cognition Arousal/Alertness: Lethargic Behavior During Therapy: Flat affect Overall Cognitive Status: Impaired/Different from baseline                                 General Comments: pt more lethargic this session, follows command to R extremities with increased cues/stimulaiton, but not with L extremities at this time. pt needing max stimulation to give yes/no nods and attempt verbalization. speaks softly and is difficult to understand        Exercises      General Comments General comments (skin integrity, edema, etc.): VSS on RA      Pertinent Vitals/Pain Pain Assessment Pain Assessment: 0-10 Pain Score: 9  Pain Location: headache, neck, and bottom Pain Descriptors / Indicators: Headache, Discomfort Pain Intervention(s): Limited activity within patient's tolerance, Monitored during session, Repositioned, Premedicated before session     PT Goals (current goals can now be found in the care plan section) Acute Rehab PT Goals Patient Stated Goal: none stated PT Goal Formulation: Patient unable to participate in goal setting Time For Goal Achievement: 06/15/22 Potential to Achieve Goals: Good Progress towards PT goals: Progressing toward goals    Frequency    Min 4X/week      PT Plan Current plan remains appropriate    Co-evaluation PT/OT/SLP Co-Evaluation/Treatment: Yes Reason for Co-Treatment: Complexity of the patient's impairments (multi-system involvement);Necessary to address cognition/behavior during functional activity;For patient/therapist safety;To address functional/ADL transfers PT goals addressed during session: Mobility/safety with mobility;Balance        AM-PAC PT "6 Clicks"  Mobility   Outcome Measure  Help needed turning from your back to your side while in a flat bed without using bedrails?: Total Help needed moving from lying on your back to sitting on the side of a flat bed without using bedrails?: Total Help needed moving to and from a bed to a chair (including a wheelchair)?: Total Help needed standing up from a chair using your arms (e.g., wheelchair or bedside chair)?: Total Help needed to walk in hospital room?: Total Help needed climbing 3-5 steps with a railing? : Total 6 Click Score: 6    End of Session Equipment Utilized During Treatment: Gait belt Activity Tolerance: Patient tolerated treatment well Patient left: in bed;with call bell/phone within reach;with bed alarm set;with restraints reapplied Nurse Communication: Mobility status PT Visit Diagnosis: Unsteadiness on feet (R26.81);Hemiplegia and hemiparesis Hemiplegia - Right/Left: Left Hemiplegia - dominant/non-dominant: Non-dominant Hemiplegia - caused by: Other cerebrovascular disease     Time: 0920-0950 PT Time Calculation (min) (ACUTE ONLY): 30 min  Charges:  $Therapeutic Activity: 8-22 mins                     Vickki Muff, PT, DPT   Acute Rehabilitation Department Office [phone] Secure Chat Communication Preferred   Ronnie Derby 06/06/2022, 11:37 AM

## 2022-06-06 NOTE — Evaluation (Signed)
Clinical/Bedside Swallow Evaluation Patient Details  Name: Kristin Simmons MRN: [id number] Date of Birth: April 06, 1985  Today's Date: 06/06/2022 Time: SLP Start Time (ACUTE ONLY): 0921 SLP Stop Time (ACUTE ONLY): 0951 SLP Time Calculation (min) (ACUTE ONLY): 30 min  Past Medical History:  Past Medical History:  Diagnosis Date   Anemia    Anxiety    Arthritis    Asthma    Cancer    cervical   Depression    Headache    Hypertension    She was taking her mother's blood pressure meds but states she doesn't need bp meds now.   Vaginal Pap smear, abnormal    Past Surgical History:  Past Surgical History:  Procedure Laterality Date   CRANIOTOMY Right 05/31/2022   Procedure: RIGHT PTERIONAL CRANIOTOMY FOR CLIPPING OF MIDDLE CEREBRAL ARTERY  ANEURYSM WITH PLACEMENT OF BONEFLAP IN ABDOMEN;  Surgeon: Lisbeth Renshaw, MD;  Location: [facility] OR;  Service: Neurosurgery;  Laterality: Right;   IR 3D INDEPENDENT WKST  03/08/2022   IR ANGIO INTRA EXTRACRAN SEL INTERNAL CAROTID BILAT MOD SED  03/08/2022   IR ANGIO VERTEBRAL SEL VERTEBRAL BILAT MOD SED  03/08/2022   IR ANGIOGRAM FOLLOW UP STUDY  03/08/2022   IR ANGIOGRAM FOLLOW UP STUDY  03/08/2022   IR ANGIOGRAM FOLLOW UP STUDY  03/08/2022   IR NEURO EACH ADD'L AFTER BASIC UNI RIGHT (MS)  03/08/2022   IR TRANSCATH/EMBOLIZ  03/08/2022   NO PAST SURGERIES     RADIOLOGY WITH ANESTHESIA N/A 03/08/2022   Procedure: Diagnostic angiogram, Possible Coil Embolization of Aneurysm;  Surgeon: Lisbeth Renshaw, MD;  Location: [facility] OR;  Service: Radiology;  Laterality: N/A;   TUBAL LIGATION Bilateral 03/27/2018   Procedure: POST PARTUM TUBAL LIGATION;  Surgeon: Levie Heritage, DO;  Location: [facility];  Service: Gynecology;  Laterality: Bilateral;   HPI:  Pt is a 37 y/o female was 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 She remained on vent post op. ETT 4/16-4/20. Placement of bone flap in subcutaneous abdominal pocket. PMH: anxiety, anemia, HTN, depression.    Assessment / Plan / Recommendation  Clinical Impression  Kristin Simmons participated in preliminary clinical swallowing assessment, sitting at the EOB with OT/PT assist. She participated in own oral care/suctioning with physical assist. Pt demonstrated adequate oral attention/anticipation of approaching spoon/cup with observed effort, but poor labial seal allowed spillage of all ice chip/water trials.  There were no spontaneous swallows observed. Secretions and PO trials required oral suctioning throughout assessment. Pt required constant cues to sustain wakefulness during session. She is not ready for POs at this time. Continue cortrak; SLP will follow for dysphagia tx and PO trials. SLP Visit Diagnosis: Dysphagia, oropharyngeal phase (R13.12)    Aspiration Risk  Severe aspiration risk    Diet Recommendation   NPO  Medication Administration: Via alternative means    Other  Recommendations Oral Care Recommendations: Oral care QID    Recommendations for follow up therapy are one component of a multi-disciplinary discharge planning process, led by the attending physician.  Recommendations may be updated based on  patient status, additional functional criteria and insurance authorization.  Follow up Recommendations Three hours therapy/day     Assistance Recommended at Discharge  frequent  Functional Status Assessment Patient has had a recent decline in their functional status and demonstrates the ability to make significant  improvements in function in a reasonable and predictable amount of time.  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 She remained on vent post op. ETT 4/16-4/20. Placement of bone flap in subcutaneous abdominal pocket. PMH: anxiety, anemia, HTN, depression. Type of Study: Bedside Swallow Evaluation Previous Swallow Assessment: no Diet Prior to this Study: NPO;Cortrak/Small bore NG tube Temperature Spikes Noted: No Respiratory Status: Room air History of Recent Intubation: Yes Total duration of intubation (days): 4 days Date extubated: 06/04/22 Behavior/Cognition: Lethargic/Drowsy Oral Cavity Assessment: Excessive secretions Oral Care Completed by SLP: Yes Oral Cavity - Dentition: Adequate natural dentition Self-Feeding Abilities: Needs assist Patient Positioning: Upright in bed    Oral/Motor/Sensory Function Overall Oral Motor/Sensory Function:  (not assessed)   Ice Chips Ice chips: Impaired Presentation: Spoon Oral Phase Impairments: Reduced labial seal;Reduced lingual movement/coordination Oral Phase Functional Implications: Oral residue;Oral holding;Right anterior spillage;Left anterior spillage Pharyngeal Phase Impairments:  (no swallow palpated)   Thin Liquid Thin Liquid: Impaired Presentation: Straw Oral Phase  Impairments: Reduced labial seal;Reduced lingual movement/coordination (unable to draw through straw)    Nectar Thick Nectar Thick Liquid: Not tested   Honey Thick Honey Thick Liquid: Not tested   Puree Puree: Not tested   Solid     Solid: Not tested      Blenda Mounts Laurice 06/06/2022,10:11 AM Marchelle Folks L. Samson Frederic, MA CCC/SLP Clinical Specialist - Acute Care SLP Acute Rehabilitation Services Office number [phone]

## 2022-06-06 NOTE — Progress Notes (Signed)
NAME:  Kristin Simmons, MRN:  [id number], DOB:  [birth year], LOS: 6 ADMISSION DATE:  05/31/2022, CONSULTATION DATE:  4/16 REFERRING MD:  Dr. 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  She was referred to neurosurgery.  She had coil embolization of Rt ophthalmic aneurysm.  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  She remained on vent post op, and PCCM consulted to assist with management in ICU.  Pertinent  Medical History  Anemia, HTN, Anxiety, Asthma, Cervical cancer, Depression, Headaches  Significant Hospital Events: Including procedures, antibiotic start and stop dates in addition to other pertinent events   4/16 To OR, craniectomy and clipping of Rt MCA aneurysm, stayed on vent, start cardene gtt 4/18 CT head >> increased anterior and middle division Rt MCA territory edema with hemorrhagic transformation, increased mass effect with 4 mm leftward shift 4/20 extubated, tracheal aspirate showing gram negative rods, Unasyn started 4/21 tracheal aspirate growing haemophilus influenzae and enterobacter, unasyn changed to cefepime  Interim History / Subjective:   No acute events Continues to have liquid stools  Objective   Blood pressure (!) 142/101, pulse 79, temperature 97.8 F (36.6 C), temperature source Axillary, resp. rate (!) 22, height  (1.626 m), weight 50.4 kg, last menstrual period 04/26/2022, SpO2 98 %.        Intake/Output Summary (Last 24 hours) at 06/06/2022 0736 Last data filed at 06/06/2022 0600 Gross per 24 hour  Intake 1857.48 ml   Output 2250 ml  Net -392.52 ml   Filed Weights   06/04/22 0707 06/05/22 0702 06/06/22 0500  Weight: 52.8 kg 53.8 kg 50.4 kg    Examination:  General - sleeping Eyes - pupils equal and reactive ENT - moist mucous membranes, core trak in place Cardiac - regular rate/rhythm, no murmur Chest - clear breath sounds, no wheezing Abdomen - soft, non tender, less distended, + bowel sounds Extremities - no cyanosis, clubbing, or edema Skin - no rashes Neuro - follows simple commands intermittently  Resolved Hospital Problem list     Assessment & Plan:   Compromised airway in setting of SAH Hemophilus Influenza/Enterobacter pneumonia - goal SpO2 > 92% - Unasyn 4/20-4/21, then started cefepime 4/21 with plan for 7 day course  SAH with Rt MCA aneurysm clipping s/p decompressive craniectomy. - post op care, AEDs per neurosurgery - continue nimodipine - SBP goal <116mmHg  HTN. - goal SBP < 160 - hold outpt norvasc while on nimodipine - continue triamterene, HCTZ - continue carvedilol  BID per tube  Agitation - PRN 0.5mg  klonipin twice daily per tube - PRN oxycodone  TID per tube  Hyperglycemia. - SSI  Nutrition Abdominal Distention Increased stool output - KUB without obstruction - continue tube feeds and meds per tube - hold bowel regimen - continue bentrol fiber supplement for increased stool output  Urinary rentention - rentention order set placed, continue bethanacol   Best Practice (right click and "Reselect all SmartList Selections" daily)   Diet/type: tubefeeds DVT prophylaxis: SCD GI prophylaxis: N/A Lines: N/A Foley:  Yes, and it is still needed Code Status:  full  code Last date of multidisciplinary goals of care discussion [updated mother at bedside 4/20]  Labs       Latest Ref Rng & Units 06/05/2022    9:29 AM 06/05/2022    7:26 AM 06/04/2022    7:43 AM  CMP  Glucose 70 - 99 mg/dL 528  413    BUN 6 - 20 mg/dL 12  11    Creatinine 2.44 -  1.00 mg/dL 0.10  2.72    Sodium 536 - 145 mmol/L 138  147  136   Potassium 3.5 - 5.1 mmol/L 3.6  2.6    Chloride 98 - 111 mmol/L 103  112    CO2 22 - 32 mmol/L 24  19    Calcium 8.9 - 10.3 mg/dL 8.8  6.3    Total Protein 6.5 - 8.1 g/dL 6.6     Total Bilirubin 0.3 - 1.2 mg/dL 0.7     Alkaline Phos 38 - 126 U/L 55     AST 15 - 41 U/L 44     ALT 0 - 44 U/L 37          Latest Ref Rng & Units 06/05/2022    8:35 PM 06/05/2022    9:29 AM 06/05/2022    7:26 AM  CBC  WBC 4.0 - 10.5 K/uL 11.8  12.1  9.5   Hemoglobin 12.0 - 15.0 g/dL 64.4  03.4  9.7   Hematocrit 36.0 - 46.0 % 37.0  36.9  28.0   Platelets 150 - 400 K/uL 273  259  201     ABG    Component Value Date/Time   PHART 7.405 05/31/2022 1513   PCO2ART 27.9 (L) 05/31/2022 1513   PO2ART 239 (H) 05/31/2022 1513   HCO3 17.6 (L) 05/31/2022 1513   TCO2 18 (L) 05/31/2022 1513   ACIDBASEDEF 6.0 (H) 05/31/2022 1513   O2SAT 100 05/31/2022 1513    CBG (last 3)  Recent Labs    06/05/22 1915 06/05/22 2316 06/06/22 0314  GLUCAP 128* 139* 149*     Critical care time: n/a   Melody Comas, MD [facility] Office: [phone]   See Amion for personal pager PCCM on call pager [phone] until 7pm. Please call Elink 7p-7a. [phone]

## 2022-06-06 NOTE — Progress Notes (Signed)
Inpatient Rehab Admissions Coordinator:   Per therapy recommendations, patient was screened for CIR candidacy by  , MS, CCC-SLP. At this time, Pt. is not yet at a level to tolerate the intensity of CIR; however,   Pt. may have potential to progress to becoming a potential CIR candidate, so CIR admissions team will follow and monitor for progress and participation with therapies and place consult order if Pt. appears to be an appropriate candidate. Please contact me with any questions.    , MS, CCC-SLP Rehab Admissions Coordinator  [phone] (celll) [phone] (office)  

## 2022-06-07 ENCOUNTER — Inpatient Hospital Stay (HOSPITAL_COMMUNITY): Payer: Medicaid Other

## 2022-06-07 DIAGNOSIS — R569 Unspecified convulsions: Secondary | ICD-10-CM | POA: Diagnosis not present

## 2022-06-07 DIAGNOSIS — Z9889 Other specified postprocedural states: Secondary | ICD-10-CM | POA: Diagnosis not present

## 2022-06-07 LAB — BASIC METABOLIC PANEL
Anion gap: 10 (ref 5–15)
BUN: 18 mg/dL (ref 6–20)
CO2: 24 mmol/L (ref 22–32)
Calcium: 9.2 mg/dL (ref 8.9–10.3)
Chloride: 97 mmol/L — ABNORMAL LOW (ref 98–111)
Creatinine, Ser: 0.54 mg/dL (ref 0.44–1.00)
GFR, Estimated: >60 mL/min (ref >60)
Glucose, Bld: 121 mg/dL — ABNORMAL HIGH (ref 70–99)
Potassium: 4.2 mmol/L (ref 3.5–5.1)
Sodium: 131 mmol/L — ABNORMAL LOW (ref 135–145)

## 2022-06-07 LAB — GLUCOSE, CAPILLARY
Glucose-Capillary: 143 mg/dL — ABNORMAL HIGH (ref 70–99)
Glucose-Capillary: 119 mg/dL — ABNORMAL HIGH (ref 70–99)
Glucose-Capillary: 143 mg/dL — ABNORMAL HIGH (ref 70–99)
Glucose-Capillary: 142 mg/dL — ABNORMAL HIGH (ref 70–99)
Glucose-Capillary: 126 mg/dL — ABNORMAL HIGH (ref 70–99)
Glucose-Capillary: 160 mg/dL — ABNORMAL HIGH (ref 70–99)

## 2022-06-07 LAB — TRIGLYCERIDES: Triglycerides: 116 mg/dL (ref <150)

## 2022-06-07 MED ORDER — LEVETIRACETAM 500 MG PO TABS
500.0000 mg | ORAL_TABLET | Freq: Two times a day (BID) | ORAL | Status: DC
  Administered 2022-06-07 – 2022-06-19 (×24): 500 mg
  Filled 2022-06-07 (×24): qty 1

## 2022-06-07 MED ORDER — HEPARIN SODIUM (PORCINE) 5000 UNIT/ML IJ SOLN
5000.0000 [IU] | Freq: Three times a day (TID) | INTRAMUSCULAR | Status: DC
  Administered 2022-06-07 – 2022-06-19 (×37): 5000 [IU] via SUBCUTANEOUS
  Filled 2022-06-07 (×36): qty 1

## 2022-06-07 MED ORDER — CHLORHEXIDINE GLUCONATE CLOTH 2 % EX PADS
6.0000 | MEDICATED_PAD | Freq: Once | CUTANEOUS | Status: AC
  Administered 2022-06-07: 6 via TOPICAL

## 2022-06-07 NOTE — Progress Notes (Signed)
Pt pulled her cortrak out. TF stopped and pt's gown changed. CCM informed.

## 2022-06-07 NOTE — Progress Notes (Signed)
Pt's cortrak noted to be unattached from nasal septum and advanced outward by 10 mm. The cortrak was then advanced inward  at 65 mm marking and abd xray placed in orders.

## 2022-06-07 NOTE — Progress Notes (Signed)
Speech Language Pathology Treatment: Dysphagia;Cognitive-Linquistic  Patient Details Name: Kristin Simmons MRN: [id number] DOB: [birth year] Today's Date: 06/07/2022 Time: 4098-1191 SLP Time Calculation (min) (ACUTE ONLY): 22 min  Assessment / Plan / Recommendation Clinical Impression  Pt's mother at bedside. Reviewed plan for treatment after yesterday's swallow and cog/communication evaluations.  Answered mother's questions about swallowing and progress. Provided reassurance that Kristin Simmons is on track with her swallowing and that she will need an NG for a while longer (anticipate it will be next week before she is ready for instrumental study). Consider changing large bore NG back to cortrak tomorrow if possible.  Swallowing: Minimal spontaneous swallowing of secretions; requires cues to remove ice from spoon; once ice reaches mid tongue she activates chewing and generates a swallow response. It is likely not yet functional - she presents with weak cough/wet throat-clearing and oral suctioning was offered after each attempt.  Cognition: Reduced initiation and sustained attention remain apparent. Pt demonstrates intact declarative recall, reciting names of children, their DOB, facts about her life; stated she was in the hospital because she had an aneurysm. Maintained eyes closed and required moderate verbal cues to provide responses and sustain attention to her answers.   Pt making steady progress. Will continue to follow while in acute care.   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 She remained on vent post op. ETT 4/16-4/20. Placement of bone flap in subcutaneous abdominal pocket. PMH: anxiety, anemia, HTN, depression.      SLP Plan  Continue with current plan of care      Recommendations for follow up therapy are one component of a multi-disciplinary discharge planning process, led by the attending physician.  Recommendations may be updated based on patient status, additional functional criteria and insurance authorization.    Recommendations  Diet recommendations: NPO Medication Administration: Via alternative means    Allow occasional ice chips after oral care (3-4)               Oral care QID   Frequent or constant Supervision/Assistance Dysphagia, oropharyngeal phase (R13.12)     Continue with current plan of care     L. Samson Frederic, MA CCC/SLP Clinical Specialist - Acute Care SLP Acute Rehabilitation Services Office number [phone]  Blenda Mounts Laurice  06/07/2022, 11:43 AM

## 2022-06-07 NOTE — Progress Notes (Signed)
LTM maint complete - no skin breakdown  Srvice Cz C4 P4 [facility] monitored, Event button test confirmed by [facility].

## 2022-06-07 NOTE — Progress Notes (Signed)
PROGRESS NOTE  Kristin Simmons  DOB: 02-26-1985  PCP: Kizzie Furnish D., PA-C WUJ:[id number]  DOA: 05/31/2022  LOS: 7 days  Hospital Day: 8  Brief narrative: Kristin Simmons is a 37 y.o. female with PMH significant for chronic smoking, HTN, cervical cancer, anxiety/depression, and poor compliance to medicines Few months ago, patient had dental pain and had made frequent trips to the ED.  As part of the workup I will see underwent maxillofacial CT with contrast which incidentally discovered right carotid aneurysm.  She was referred to neurosurgery.   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  At that time, she underwent coil embolization of the right ophthalmic aneurysm.  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  4/16, she was electively admitted by neurosurgery and underwent an right pterional craniectomy for clipping of MCA aneurysm, complex due to intraoperative rupture and need for temporary MCA occlusion. Postprocedure, she stayed on ventilator, moved to ICU and was also started on Cardene drip. 4/18 CT head showed increased anterior and middle division Rt MCA territory edema with hemorrhagic transformation, increased mass effect with 4 mm leftward shift 4/20 extubated 4/23, transferred out to [facility]   Subjective: Patient was seen and examined this morning.  Young African-American female.  Somnolent, grunting, does not seem to be in pain though. Mother at bedside. Has dense left hemiplegia.  Apparently pulled out Cortrak this morning.  Has an NG tube.  Per RN, patient wakes up intermittently and expresses her needs but tends to be somnolent at other times without any meds Required in and out catheterization twice in last 24 hours.   May need Foley catheter replaced again. Chart reviewed In the last 24 hours, no fever, heart rate in 80s, blood pressure in 130s to 150s, breathing on room air  Assessment and plan: Rt MCA aneurysm clipping  Intraoperative aneurysm rupture s/p decompressive craniectomy Postop cerebral edema with hemorrhagic transformation and mass effect Neurosurgery following Neurological status seems to be improving.  Patient seems to have intermittent episodes of wakefulness and somnolence as well as restlessness.  Per RN, while patient is awake, her speech is clear and she can express her needs.  Somnolent at the time of my evaluation.  Has left dense hemiplegia. Continue to monitor for neurological recovery Currently on IV Keppra 500 mg twice daily  Essential hypertension PTA noncompliant to meds Postprocedure, patient was on Cardene drip. Currently on nimodipine 60 mg every 4 hours as well as triamterene 37.5 mg daily and HCTZ 25 mg daily SBP goal <180mmHg  Postop respiratory insufficiency Extubated 4/20.  Currently on room air.  Haemophilus influenza/Enterobacter pneumonia Tracheal aspirate showed haemophilus influenza and Enterobacter.  Currently on cefepime IV  Dysphagia Currently on tube feeding Continue SSI Recent Labs  Lab 06/06/22 1538 06/06/22 1916 06/06/22 2320 06/07/22 0327 06/07/22 0804  GLUCAP 160* 179* 156* 143* 119*   Agitation Continue PRN Klonopin 0.5 mg twice daily and PRN oxycodone  TID   Abdominal Distention KUB without obstruction Continue tube feeds and meds per tube Monitor bowel function   Urinary rentention Foley catheter was removed yesterday.  Patient has required in and out catheterization twice in last 24 hours. If continues to require in and out catheterization, patient may benefit from Foley insertion  Mobility: Needs  PT eval continued  Goals of care   Code Status: Full Code     DVT prophylaxis:  heparin injection 5,000 Units Start:  06/07/22 0915 SCDs Start: 06/02/22 0847 SCD's Start: 05/31/22 1320   Antimicrobials: Currently on IV cefepime Fluid: Ongoing tube feeding Family Communication: Mother at bedside  Diet:  Diet Order     None       Scheduled Meds:  Chlorhexidine Gluconate Cloth  6 each Topical Daily   feeding supplement (PROSource TF20)  60 mL Per Tube Daily   fiber supplement (BANATROL TF)  60 mL Per Tube BID   folic acid  1 mg Per Tube Daily   heparin injection (subcutaneous)  5,000 Units Subcutaneous Q8H   insulin aspart  0-15 Units Subcutaneous Q4H   niMODipine  60 mg Oral Q4H   Or   niMODipine  60 mg Per Tube Q4H   mouth rinse  15 mL Mouth Rinse 4 times per day   sodium chloride flush  10-40 mL Intracatheter Q12H   thiamine  100 mg Per Tube Daily   triamterene-hydrochlorothiazide  1 tablet Per Tube Daily    PRN meds: sodium chloride, acetaminophen **OR** acetaminophen (TYLENOL) oral liquid 160 mg/5 mL **OR** acetaminophen, clonazePAM, hydrALAZINE, mouth rinse, oxyCODONE, sodium chloride flush   Infusions:   sodium chloride 5 mL/hr at 06/07/22 1000   ceFEPime (MAXIPIME) IV Stopped (06/07/22 0629)   feeding supplement (OSMOLITE 1.5 CAL) Stopped (06/07/22 1610)   levETIRAcetam Stopped (06/07/22 0339)    Antimicrobials: Anti-infectives (From admission, onward)    Start     Dose/Rate Route Frequency Ordered Stop   06/05/22 1400  ceFEPIme (MAXIPIME) 2 g in sodium chloride 0.9 % 100 mL IVPB        2 g 200 mL/hr over 30 Minutes Intravenous Every 8 hours 06/05/22 1256     06/04/22 0830  Ampicillin-Sulbactam (UNASYN) 3 g in sodium chloride 0.9 % 100 mL IVPB  Status:  Discontinued        3 g 200 mL/hr over 30 Minutes Intravenous Every 6 hours 06/04/22 0733 06/05/22 1256   05/31/22 0611  ceFAZolin (ANCEF) 2-4 GM/100ML-% IVPB       Note to Pharmacy: Amanda Pea M: cabinet override      05/31/22 0611 05/31/22 0841   05/31/22 0600  ceFAZolin (ANCEF) IVPB 2g/100 mL premix        2 g 200  mL/hr over 30 Minutes Intravenous On call to O.R. 05/31/22 0553 05/31/22 0816       Nutritional status:  Body mass index is 19.9 kg/m.  Nutrition Problem: Inadequate oral intake Etiology: inability to eat Signs/Symptoms: NPO status     Objective: Vitals:   06/07/22 0940 06/07/22 1000  BP: (!) 151/91 (!) 139/106  Pulse: 80 91  Resp: 19 17  Temp:    SpO2: 98% 95%    Intake/Output Summary (Last 24 hours) at 06/07/2022 1052 Last data filed at 06/07/2022 1000 Gross per 24 hour  Intake 1661.57 ml  Output 1710 ml  Net -48.43 ml   Filed Weights   06/05/22 0702 06/06/22 0500 06/07/22 0300  Weight: 53.8 kg 50.4 kg 52.6 kg   Weight change: -1.2 kg Body mass index is 19.9 kg/m.   Physical Exam: General exam: Young African-American female.  Not in pain Skin: No rashes, lesions or ulcers. HEENT: Atraumatic, normocephalic, no obvious bleeding.  NG tube in place Lungs: Clear to auscultation bilaterally CVS: Regular rate and rhythm, no murmur GI/Abd: soft, nontender, mild distention  present.  Bowel sound present. CNS: As described above Psychiatry: Unable to examine because of altered mentation Extremities: No pedal edema, no calf tenderness  Data Review: I have personally reviewed the laboratory data and studies available.  F/u labs ordered Unresulted Labs (From admission, onward)     Start     Ordered   06/01/22 0500  Triglycerides  (propofol (DIPRIVAN))  Every 72 hours,   R     Comments: While on propofol (DIPRIVAN)    05/31/22 1226   Unscheduled  CBC with Differential/Platelet  Tomorrow morning,   R       Question:  Specimen collection method  Answer:  Unit=Unit collect   06/07/22 1051   Unscheduled  Basic metabolic panel  Tomorrow morning,   R       Question:  Specimen collection method  Answer:  Unit=Unit collect   06/07/22 1051            Total time spent in review of labs and imaging, patient evaluation, formulation of plan, documentation and  communication with family: 55 minutes  Signed, Lorin Glass, MD [facility] 06/07/2022

## 2022-06-07 NOTE — Progress Notes (Signed)
  NEUROSURGERY PROGRESS NOTE   Pt seen and examined. Pt removed cortrak last night, otherwise no issues.  EXAM: Temp:  [97.2 F (36.2 C)-99.7 F (37.6 C)] 98.4 F (36.9 C) (04/23 0800) Pulse Rate:  [64-111] 80 (04/23 0700) Resp:  [16-27] 20 (04/23 0700) BP: (127-168)/(59-119) 151/85 (04/23 0700) SpO2:  [90 %-99 %] 98 % (04/23 0700) Weight:  [52.6 kg] 52.6 kg (04/23 0300) Intake/Output      04/22 0701 04/23 0700 04/23 0701 04/24 0700   I.V. (mL/kg) 132.7 (2.5)    Other 30    NG/GT 1034.3    IV Piggyback 499.8    Total Intake(mL/kg) 1696.8 (32.3)    Urine (mL/kg/hr) 1205 (1)    Stool 335    Total Output 1540    Net +156.8          Drowsy but opens eyes to stim Pupils 2mm, reactive Answers simple questions  Following commands RUE/RLE, moves spontaneously and purposefully Per RN, was following some commands on left, intermittently yesterday Wound c/d/I  LABS: Lab Results  Component Value Date   CREATININE 0.59 06/06/2022   BUN 19 06/06/2022   NA 130 (L) 06/06/2022   K 3.8 06/06/2022   CL 96 (L) 06/06/2022   CO2 25 06/06/2022   Lab Results  Component Value Date   WBC 14.0 (H) 06/06/2022   HGB 13.1 06/06/2022   HCT 37.1 06/06/2022   MCV 89.2 06/06/2022   PLT 331 06/06/2022     TCD: Date POD PCO2 HCT BP   MCA ACA PCA OPHT SIPH VERT Basilar  4/17,rs         Right  Left   41  59   -35  -37   47  40   -25  -28   -36       4/19 GC         Right  Left   77  73   -35  -45   11  49   19  19   48  20   *  *   *       4/22  [facility]       139/86  Right  Left    83   118    -27   *    19   *    *   *    *   *    -26   -39    -76        IMPRESSION: - 37 y.o. female POD#7 with intraoperative aneurysm rupture during opening. Exam appears subtly improved, somewhat more interactive. - VAP  PLAN: - f/u EEG, cont Keppra - Continue Nimotop - SBP goal <120mmHg - Cont PT/OT - Cont abx per PCCM   Lisbeth Renshaw,  MD [facility]

## 2022-06-07 NOTE — Progress Notes (Signed)
Physical Therapy Treatment Patient Details Name: Kristin Simmons MRN: [id number] DOB: 06/18/1985 Today's Date: 06/07/2022   History of Present Illness Pt is a 37yo female who was admitted for elective coiling of R MCA aneurysm however this was complicated by intraoperative rupture resulting in a R pterional craniectomy for clipping of MCA aneurysm. Placement of bone flap in subcutaneous abdominal pocket. PMH: anxiety, anemia, HTN, depression    PT Comments    Pt more verbal than last session however continues to be keep eyes closed and presents with overall lethargy. Pt restless and moving R UE and LE all over bed. Focused on EOB balance, pt unable to lift head up to midline and has forward bias requiring mod/maxA to maintain EOB balance. Difficult to engage pt in LE seated exercises due to limited command follow, decreased attn span, and lethargy. Acute PT to cont to follow.   Recommendations for follow up therapy are one component of a multi-disciplinary discharge planning process, led by the attending physician.  Recommendations may be updated based on patient status, additional functional criteria and insurance authorization.  Follow Up Recommendations       Assistance Recommended at Discharge Frequent or constant Supervision/Assistance  Patient can return home with the following Two people to help with walking and/or transfers;A lot of help with bathing/dressing/bathroom;Assistance with cooking/housework;Direct supervision/assist for medications management;Direct supervision/assist for financial management;Assist for transportation;Help with stairs or ramp for entrance   Equipment Recommendations  Other (comment) (defer to post acute)    Recommendations for Other Services Rehab consult     Precautions / Restrictions Precautions Precautions: Fall;Other (comment) Precaution Comments: R craniectomy, no bone flap, bone flap placed in abdomen, R mitt Restrictions Weight Bearing  Restrictions: No     Mobility  Bed Mobility Overal bed mobility: Needs Assistance Bed Mobility: Rolling, Supine to Sit, Sit to Supine Rolling: Max assist, +2 for physical assistance   Supine to sit: Max assist, +2 for physical assistance Sit to supine: Max assist, +2 for physical assistance   General bed mobility comments: pt needing maxA of 2 for all bed moblity and transfer to EOB due to lack of initiation    Transfers                   General transfer comment: pt hooked up to EEG and xray tech came    Ambulation/Gait               General Gait Details: unable this date   Stairs             Wheelchair Mobility    Modified Rankin (Stroke Patients Only) Modified Rankin (Stroke Patients Only) Pre-Morbid Rankin Score: No symptoms Modified Rankin: Severe disability     Balance Overall balance assessment: Needs assistance Sitting-balance support: Feet unsupported, No upper extremity supported Sitting balance-Leahy Scale: Poor Sitting balance - Comments: pt with L lateral lean, increased cervical flexion and forward bias, tactile cues along back muscles to promote upright posture                                    Cognition Arousal/Alertness: Lethargic Behavior During Therapy: Flat affect, Restless Overall Cognitive Status: Impaired/Different from baseline Area of Impairment: Following commands, Safety/judgement, Awareness, Problem solving                       Following Commands: Follows one step commands inconsistently (requires  tactile cues, minimal with L LE and UE) Safety/Judgement: Decreased awareness of safety, Decreased awareness of deficits (restless) Awareness: Intellectual Problem Solving: Slow processing, Decreased initiation, Difficulty sequencing, Requires verbal cues, Requires tactile cues General Comments: pt kept eyes closed, perseverated on heat pad for neck. Pt followed majority of commands on R side but  not on L        Exercises Other Exercises Other Exercises: attempted LE LAQ however pt unable to follow Other Exercises: cervical ROM and therapeutic massage to neck due to c/o neck pain    General Comments General comments (skin integrity, edema, etc.): VSS on RA      Pertinent Vitals/Pain Pain Assessment Pain Assessment: Faces Faces Pain Scale: Hurts even more Pain Location: neck Pain Descriptors / Indicators: Discomfort Pain Intervention(s): Heat applied (at end of session)    Home Living                          Prior Function            PT Goals (current goals can now be found in the care plan section) Acute Rehab PT Goals Patient Stated Goal: none stated PT Goal Formulation: Patient unable to participate in goal setting Time For Goal Achievement: 06/15/22 Potential to Achieve Goals: Good Progress towards PT goals: Progressing toward goals    Frequency    Min 4X/week      PT Plan Current plan remains appropriate    Co-evaluation              AM-PAC PT "6 Clicks" Mobility   Outcome Measure  Help needed turning from your back to your side while in a flat bed without using bedrails?: Total Help needed moving from lying on your back to sitting on the side of a flat bed without using bedrails?: Total Help needed moving to and from a bed to a chair (including a wheelchair)?: Total Help needed standing up from a chair using your arms (e.g., wheelchair or bedside chair)?: Total Help needed to walk in hospital room?: Total Help needed climbing 3-5 steps with a railing? : Total 6 Click Score: 6    End of Session Equipment Utilized During Treatment: Gait belt Activity Tolerance: Patient tolerated treatment well Patient left: in bed;with call bell/phone within reach;with bed alarm set;with restraints reapplied;with family/visitor present Nurse Communication: Mobility status PT Visit Diagnosis: Unsteadiness on feet (R26.81);Hemiplegia and  hemiparesis Hemiplegia - Right/Left: Left Hemiplegia - dominant/non-dominant: Non-dominant Hemiplegia - caused by: Other cerebrovascular disease     Time: 1017-1042 PT Time Calculation (min) (ACUTE ONLY): 25 min  Charges:  $Therapeutic Exercise: 8-22 mins $Therapeutic Activity: 8-22 mins                     Lewis Shock, PT, DPT Acute Rehabilitation Services Secure chat preferred Office #: [phone]    Iona Hansen 06/07/2022, 1:51 PM

## 2022-06-07 NOTE — Procedures (Signed)
Patient Name: Kristin Simmons  MRN: [id number]  Epilepsy Attending: Charlsie Quest  Referring Physician/Provider: Lisbeth Renshaw, MD  Duration: 06/06/2022 1324 to 06/07/2022 1324  Patient history: 37 y.o. female POD#6 with intraoperative aneurysm rupture during opening. Exam appears subtly improved, now with some left-sided movement although it appears her exam waxes and wanes. EEG to evaluate for seizure  Level of alertness: Awake,asleep  AEDs during EEG study: LEV  Technical aspects: This EEG study was done with scalp electrodes positioned according to the 10-20 International system of electrode placement. Electrical activity was reviewed with band pass filter of 1-70Hz , sensitivity of 7 uV/mm, display speed of 60mm/sec with a  notched filter applied as appropriate. EEG data were recorded continuously and digitally stored.  Video monitoring was available and reviewed as appropriate.  Description: The posterior dominant rhythm consists of 10 Hz activity of moderate voltage (25-35 uV) seen predominantly in posterior head regions, asymmetric ( right<left) and reactive to eye opening and eye closing. Sleep was characterized by vertex waves, sleep spindles (12 to 14 Hz), maximal frontocentral region. EEG showed continuous 2-3 Hz delta slowing right hemisphere. Hyperventilation and photic stimulation were not performed.     ABNORMALITY - Continuous slow, right hemisphere  IMPRESSION: This study is suggestive of cortical dysfunction arising from right hemisphere likely secondary to underlying structural abnormality. No seizures or epileptiform discharges were seen throughout the recording.   Annabelle Harman

## 2022-06-07 NOTE — Progress Notes (Signed)
Pt's mother updated via voicemail about pt pulling her cortrak out. Pt's mother called back and informed verbally.

## 2022-06-08 ENCOUNTER — Inpatient Hospital Stay (HOSPITAL_COMMUNITY): Payer: Medicaid Other

## 2022-06-08 DIAGNOSIS — R569 Unspecified convulsions: Secondary | ICD-10-CM | POA: Diagnosis not present

## 2022-06-08 DIAGNOSIS — Z9889 Other specified postprocedural states: Secondary | ICD-10-CM | POA: Diagnosis not present

## 2022-06-08 DIAGNOSIS — I609 Nontraumatic subarachnoid hemorrhage, unspecified: Secondary | ICD-10-CM | POA: Diagnosis not present

## 2022-06-08 LAB — CBC WITH DIFFERENTIAL/PLATELET
Abs Immature Granulocytes: 0.07 10*3/uL (ref 0.00–0.07)
Basophils Absolute: 0.1 10*3/uL (ref 0.0–0.1)
Basophils Relative: 1 %
Eosinophils Absolute: 0.3 10*3/uL (ref 0.0–0.5)
Eosinophils Relative: 2 %
HCT: 39.9 % (ref 36.0–46.0)
Hemoglobin: 13.8 g/dL (ref 12.0–15.0)
Immature Granulocytes: 1 %
Lymphocytes Relative: 14 %
Lymphs Abs: 2 10*3/uL (ref 0.7–4.0)
MCH: 30.9 pg (ref 26.0–34.0)
MCHC: 34.6 g/dL (ref 30.0–36.0)
MCV: 89.3 fL (ref 80.0–100.0)
Monocytes Absolute: 1.6 10*3/uL — ABNORMAL HIGH (ref 0.1–1.0)
Monocytes Relative: 11 %
Neutro Abs: 10.7 10*3/uL — ABNORMAL HIGH (ref 1.7–7.7)
Neutrophils Relative %: 71 %
Platelets: 486 10*3/uL — ABNORMAL HIGH (ref 150–400)
RBC: 4.47 MIL/uL (ref 3.87–5.11)
RDW: 13.4 % (ref 11.5–15.5)
WBC: 14.7 10*3/uL — ABNORMAL HIGH (ref 4.0–10.5)
nRBC: 0 % (ref 0.0–0.2)

## 2022-06-08 LAB — GLUCOSE, CAPILLARY
Glucose-Capillary: 135 mg/dL — ABNORMAL HIGH (ref 70–99)
Glucose-Capillary: 157 mg/dL — ABNORMAL HIGH (ref 70–99)
Glucose-Capillary: 166 mg/dL — ABNORMAL HIGH (ref 70–99)
Glucose-Capillary: 138 mg/dL — ABNORMAL HIGH (ref 70–99)
Glucose-Capillary: 145 mg/dL — ABNORMAL HIGH (ref 70–99)
Glucose-Capillary: 185 mg/dL — ABNORMAL HIGH (ref 70–99)

## 2022-06-08 LAB — BASIC METABOLIC PANEL
Anion gap: 11 (ref 5–15)
BUN: 23 mg/dL — ABNORMAL HIGH (ref 6–20)
CO2: 21 mmol/L — ABNORMAL LOW (ref 22–32)
Calcium: 9.6 mg/dL (ref 8.9–10.3)
Chloride: 95 mmol/L — ABNORMAL LOW (ref 98–111)
Creatinine, Ser: 0.6 mg/dL (ref 0.44–1.00)
GFR, Estimated: >60 mL/min (ref >60)
Glucose, Bld: 132 mg/dL — ABNORMAL HIGH (ref 70–99)
Potassium: 4.7 mmol/L (ref 3.5–5.1)
Sodium: 127 mmol/L — ABNORMAL LOW (ref 135–145)

## 2022-06-08 MED ORDER — CARVEDILOL 3.125 MG PO TABS
3.1250 mg | ORAL_TABLET | Freq: Two times a day (BID) | ORAL | Status: DC
  Administered 2022-06-08 – 2022-06-13 (×11): 3.125 mg
  Filled 2022-06-08 (×11): qty 1

## 2022-06-08 NOTE — Progress Notes (Signed)
Physical Therapy Treatment Patient Details Name: Kristin Simmons MRN: [id number] DOB: 1985/08/13 Today's Date: 06/08/2022   History of Present Illness Pt is a 37yo female who was admitted for elective coiling of R MCA aneurysm however this was complicated by intraoperative rupture resulting in a R pterional craniectomy for clipping of MCA aneurysm. Placement of bone flap in subcutaneous abdominal pocket. PMH: anxiety, anemia, HTN, depression    PT Comments    Patient more alert this session than last with mother in the room chiding at her to keep her awake and requesting therapy assist with OOB.  Patient attempting head control about 20% of session needing PT to hold her head in midline with careful attention due cranial defect.  She was unable to manage secretions but voiced interest in p.o.  She kept eyes open about 20-25% of session with much cueing and manual assist for eye opening at times.  She tends to lean posterior and L needing mod to max A for balance at EOB.  She will continue to benefit from skilled PT in the acute setting.  Recommend post-acute intensive inpatient rehab prior to d/c home with family assistance.   Recommendations for follow up therapy are one component of a multi-disciplinary discharge planning process, led by the attending physician.  Recommendations may be updated based on patient status, additional functional criteria and insurance authorization.  Follow Up Recommendations       Assistance Recommended at Discharge Frequent or constant Supervision/Assistance  Patient can return home with the following Two people to help with walking and/or transfers;A lot of help with bathing/dressing/bathroom;Assistance with cooking/housework;Direct supervision/assist for medications management;Direct supervision/assist for financial management;Assist for transportation;Help with stairs or ramp for entrance   Equipment Recommendations  Other (comment) (TBA)    Recommendations  for Other Services       Precautions / Restrictions Precautions Precautions: Fall Precaution Comments: R craniectomy, no bone flap, bone flap placed in abdomen, R mitt     Mobility  Bed Mobility Overal bed mobility: Needs Assistance Bed Mobility: Rolling, Sidelying to Sit Rolling: Max assist, +2 for physical assistance Sidelying to sit: Total assist, +2 for physical assistance   Sit to supine: Max assist, +2 for physical assistance   General bed mobility comments: initiating with rolling, but assist for legs off bed and heavy lifting for trunk; to supine in prep for OOB and assist to lower trunk and lift L LE    Transfers Overall transfer level: Needs assistance Equipment used: None Transfers: Bed to chair/wheelchair/BSC       Squat pivot transfers: Total assist, +2 physical assistance     General transfer comment: up to recliner with lifting and placing in chair and scooting to reposition    Ambulation/Gait                   Stairs             Wheelchair Mobility    Modified Rankin (Stroke Patients Only) Modified Rankin (Stroke Patients Only) Pre-Morbid Rankin Score: No symptoms Modified Rankin: Severe disability     Balance Overall balance assessment: Needs assistance Sitting-balance support: Feet supported, Single extremity supported Sitting balance-Leahy Scale: Poor Sitting balance - Comments: leans L and head flexed to R Postural control: Left lateral lean   Standing balance-Leahy Scale: Zero Standing balance comment: +2 for up OOB  Cognition Arousal/Alertness: Lethargic Behavior During Therapy: Restless Overall Cognitive Status: Impaired/Different from baseline Area of Impairment: Attention, Following commands, Safety/judgement, Problem solving, Awareness, Memory                   Current Attention Level: Focused Memory: Decreased short-term memory Following Commands: Follows one step  commands consistently, Follows one step commands with increased time Safety/Judgement: Decreased awareness of safety, Decreased awareness of deficits Awareness: Intellectual Problem Solving: Slow processing, Decreased initiation, Difficulty sequencing, Requires verbal cues, Requires tactile cues General Comments: following commands to wipe her mouth due to not managing secretions and occasionally to open her eyes though needing multimodal cues; somewhat lethargic, though talking a lot and complaining some with mother continuing to cue her to stay awake, keep eyes open, etc        Exercises      General Comments General comments (skin integrity, edema, etc.): mother and one daughter present; pt engaging with another family member via face time.  VSS and on continuous EEG though mother noted to be removed and RN reports coretrak to be placed (currently with larger NG tube)      Pertinent Vitals/Pain Pain Assessment Faces Pain Scale: Hurts little more Pain Location: neck, head Pain Descriptors / Indicators: Discomfort, Sore Pain Intervention(s): Monitored during session, Repositioned    Home Living                          Prior Function            PT Goals (current goals can now be found in the care plan section) Progress towards PT goals: Progressing toward goals    Frequency    Min 4X/week      PT Plan Current plan remains appropriate    Co-evaluation PT/OT/SLP Co-Evaluation/Treatment: Yes Reason for Co-Treatment: Complexity of the patient's impairments (multi-system involvement);Necessary to address cognition/behavior during functional activity;For patient/therapist safety;To address functional/ADL transfers PT goals addressed during session: Mobility/safety with mobility;Balance        AM-PAC PT "6 Clicks" Mobility   Outcome Measure  Help needed turning from your back to your side while in a flat bed without using bedrails?: Total Help needed moving  from lying on your back to sitting on the side of a flat bed without using bedrails?: Total Help needed moving to and from a bed to a chair (including a wheelchair)?: Total Help needed standing up from a chair using your arms (e.g., wheelchair or bedside chair)?: Total Help needed to walk in hospital room?: Total Help needed climbing 3-5 steps with a railing? : Total 6 Click Score: 6    End of Session Equipment Utilized During Treatment: Gait belt Activity Tolerance: Patient tolerated treatment well Patient left: in chair;with chair alarm set;with call bell/phone within reach;with family/visitor present;with restraints reapplied Nurse Communication: Mobility status;Need for lift equipment PT Visit Diagnosis: Unsteadiness on feet (R26.81);Hemiplegia and hemiparesis Hemiplegia - Right/Left: Left Hemiplegia - dominant/non-dominant: Non-dominant Hemiplegia - caused by: Nontraumatic intracerebral hemorrhage     Time: 1105-1150 PT Time Calculation (min) (ACUTE ONLY): 45 min  Charges:  $Therapeutic Activity: 23-37 mins                     Sheran Lawless, PT Acute Rehabilitation Services [phone] 06/08/2022    Kristin Simmons 06/08/2022, 2:08 PM

## 2022-06-08 NOTE — Plan of Care (Signed)
  Problem: Intracerebral Hemorrhage Tissue Perfusion: Goal: Complications of Intracerebral Hemorrhage will be minimized Outcome: Progressing   Problem: Coping: Goal: Will verbalize positive feelings about self Outcome: Progressing Goal: Will identify appropriate support needs Outcome: Progressing   Problem: Health Behavior/Discharge Planning: Goal: Goals will be collaboratively established with patient/family Outcome: Progressing   Problem: Self-Care: Goal: Verbalization of feelings and concerns over difficulty with self-care will improve Outcome: Progressing Goal: Ability to communicate needs accurately will improve Outcome: Progressing   Problem: Nutrition: Goal: Risk of aspiration will decrease Outcome: Progressing Goal: Dietary intake will improve Outcome: Progressing   Problem: Activity: Goal: Ability to tolerate increased activity will improve Outcome: Progressing   Problem: Respiratory: Goal: Ability to maintain a clear airway and adequate ventilation will improve Outcome: Progressing   Problem: Role Relationship: Goal: Method of communication will improve Outcome: Progressing   Problem: Coping: Goal: Ability to adjust to condition or change in health will improve Outcome: Progressing   Problem: Fluid Volume: Goal: Ability to maintain a balanced intake and output will improve Outcome: Progressing   Problem: Metabolic: Goal: Ability to maintain appropriate glucose levels will improve Outcome: Progressing   Problem: Nutritional: Goal: Maintenance of adequate nutrition will improve Outcome: Progressing Goal: Progress toward achieving an optimal weight will improve Outcome: Progressing   Problem: Skin Integrity: Goal: Risk for impaired skin integrity will decrease Outcome: Progressing   Problem: Tissue Perfusion: Goal: Adequacy of tissue perfusion will improve Outcome: Progressing   Problem: Safety: Goal: Non-violent Restraint(s) Outcome:  Progressing   Problem: Education: Goal: Knowledge of disease or condition will improve Outcome: Not Progressing Goal: Knowledge of secondary prevention will improve (MUST DOCUMENT ALL) Outcome: Not Progressing Goal: Knowledge of patient specific risk factors will improve Loraine Leriche N/A or DELETE if not current risk factor) Outcome: Not Progressing   Problem: Health Behavior/Discharge Planning: Goal: Ability to manage health-related needs will improve Outcome: Not Progressing   Problem: Self-Care: Goal: Ability to participate in self-care as condition permits will improve Outcome: Not Progressing   Problem: Education: Goal: Ability to describe self-care measures that may prevent or decrease complications (Diabetes Survival Skills Education) will improve Outcome: Not Progressing Goal: Individualized Educational Video(s) Outcome: Not Progressing   Problem: Health Behavior/Discharge Planning: Goal: Ability to identify and utilize available resources and services will improve Outcome: Not Progressing Goal: Ability to manage health-related needs will improve Outcome: Not Progressing

## 2022-06-08 NOTE — Progress Notes (Signed)
LTM maint complete - no skin breakdown . Serviced CZ C4 P4 T7 [facility] monitored, Event button test confirmed by [facility].

## 2022-06-08 NOTE — Procedures (Signed)
Patient Name: ARDELIA WREDE  MRN: [id number]  Epilepsy Attending: Charlsie Quest  Referring Physician/Provider: Lisbeth Renshaw, MD  Duration: 06/07/2022 1324 to 06/08/2022 1324   Patient history: 37 y.o. female POD#6 with intraoperative aneurysm rupture during opening. Exam appears subtly improved, now with some left-sided movement although it appears her exam waxes and wanes. EEG to evaluate for seizure   Level of alertness: Awake,asleep   AEDs during EEG study: LEV   Technical aspects: This EEG study was done with scalp electrodes positioned according to the 10-20 International system of electrode placement. Electrical activity was reviewed with band pass filter of 1-70Hz , sensitivity of 7 uV/mm, display speed of 16mm/sec with a  notched filter applied as appropriate. EEG data were recorded continuously and digitally stored.  Video monitoring was available and reviewed as appropriate.   Description: The posterior dominant rhythm consists of 10 Hz activity of moderate voltage (25-35 uV) seen predominantly in posterior head regions, asymmetric ( right<left) and reactive to eye opening and eye closing. Sleep was characterized by vertex waves, sleep spindles (12 to 14 Hz), maximal frontocentral region. EEG showed continuous 2-3 Hz delta slowing right hemisphere. Hyperventilation and photic stimulation were not performed.      ABNORMALITY - Continuous slow, right hemisphere   IMPRESSION: This study is suggestive of cortical dysfunction arising from right hemisphere likely secondary to underlying structural abnormality. No seizures or epileptiform discharges were seen throughout the recording.    Annabelle Harman

## 2022-06-08 NOTE — Progress Notes (Signed)
  NEUROSURGERY PROGRESS NOTE   Pt seen and examined. No issues overnight.  EXAM: Temp:  [98.3 F (36.8 C)-99.3 F (37.4 C)] 98.9 F (37.2 C) (04/24 0800) Pulse Rate:  [73-112] 98 (04/24 0700) Resp:  [16-27] 20 (04/24 0700) BP: (129-168)/(82-112) 158/93 (04/24 0700) SpO2:  [94 %-100 %] 97 % (04/24 0700) Weight:  [53.6 kg] 53.6 kg (04/24 0457) Intake/Output      04/23 0701 04/24 0700 04/24 0701 04/25 0700   I.V. (mL/kg) 162.6 (3)    Other     NG/GT 596.3    IV Piggyback 200.1    Total Intake(mL/kg) 959 (17.9)    Urine (mL/kg/hr) 1175 (0.9)    Stool 0    Total Output 1175    Net -216.1          Drowsy but opens eyes to stim Pupils 2mm, reactive Answers simple questions, oriented to person, hospital Following commands RUE/RLE, moves spontaneously and purposefully Per RN, was following some commands on left, w/d to pain for me Wound c/d/I, remains somewhat tense  LABS: Lab Results  Component Value Date   CREATININE 0.54 06/07/2022   BUN 18 06/07/2022   NA 131 (L) 06/07/2022   K 4.2 06/07/2022   CL 97 (L) 06/07/2022   CO2 24 06/07/2022   Lab Results  Component Value Date   WBC 14.0 (H) 06/06/2022   HGB 13.1 06/06/2022   HCT 37.1 06/06/2022   MCV 89.2 06/06/2022   PLT 331 06/06/2022     TCD: Date POD PCO2 HCT BP   MCA ACA PCA OPHT SIPH VERT Basilar  4/17,rs         Right  Left   41  59   -35  -37   47  40   -25  -28   -36       4/19 GC         Right  Left   77  73   -35  -45   11  49   19  19   48  20   *  *   *       4/22  [facility]       139/86  Right  Left    83   118    -27   *    19   *    *   *    *   *    -26   -39    -76        IMPRESSION: - 37 y.o. female POD#8 with intraoperative aneurysm rupture during opening. Exam appears subtly improved, somewhat more interactive. - VAP - Hyponatermia  PLAN: - No SZ overnight, can likely d/c LTEEG today, will cont Keppra - Continue Nimotop - will d/c  HCTZ - Cont PT/OT - Cont abx per PCCM   Lisbeth Renshaw, MD [facility]

## 2022-06-08 NOTE — Progress Notes (Signed)
Transcranial Doppler   Date POD PCO2 HCT BP   MCA ACA PCA OPHT SIPH VERT Basilar  4/17,rs         Right  Left   41  59   -35  -37   47  40   -25  -28   -36       4/19 GC         Right  Left   77  73   -35  -45   11  49   19  19   48  20   *  *   *       4/22  [facility]       139/86  Right  Left    118   83    *   -27   *   19    *   *    *   *    -26   -39    -76        4/24  [facility]        142/101 Right  Left    140   76   -22   -44    45   46    26   26    61   30    *   -30    -47                   Right  Left                                                               Right  Left                                                               Right  Left                                                       MCA = Middle Cerebral Artery      OPHT = Opthalmic Artery     BASILAR = Basilar Artery   ACA = Anterior Cerebral Artery     SIPH = Carotid Siphon PCA = Posterior Cerebral Artery   VERT = Verterbral Artery                    Normal MCA = 62+\-12 ACA = 50+\-12 PCA = 42+\-23    * - Unable to insonate   Lindegaard Ratio - Right 7.0  , Left 2.4  06/08/2022 4:30 PM Jean Rosenthal, RVT, RDMS

## 2022-06-08 NOTE — Progress Notes (Signed)
PROGRESS NOTE  [name]  DOB: May 03, 1985  PCP: Tylene Fantasia., PA-C ZOX:[id number]  DOA: 05/31/2022  LOS: 8 days  Hospital Day: 9  Brief narrative: Kristin Simmons is a 37 y.o. female with PMH significant for chronic smoking, HTN, cervical cancer, anxiety/depression, and poor compliance to medicines Few months ago, patient had dental pain and had made frequent trips to the ED.  As part of the workup I will see underwent maxillofacial CT with contrast which incidentally discovered right carotid aneurysm.  She was referred to neurosurgery.   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  At that time, she underwent coil embolization of the right ophthalmic aneurysm.  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  4/16, she was electively admitted by neurosurgery and underwent an right pterional craniectomy for clipping of MCA aneurysm, complex due to intraoperative rupture and need for temporary MCA occlusion. Postprocedure, she stayed on ventilator, moved to ICU and was also started on Cardene drip. 4/18 CT head showed increased anterior and middle division Rt MCA territory edema with hemorrhagic transformation, increased mass effect with 4 mm leftward shift 4/20 extubated 4/23, transferred out to [facility]   Subjective: Patient was seen and examined this morning.   Somnolent, mumbles on command.  Per nursing staff, she was able to speak clearly for her earlier but with eyes closed.  Mental status wax and wane.  Continues to have tension plegia  NG tube feeding continues.  Foley catheter Metubine started again yesterday Blood pressure running elevated to 150s.  Assessment and plan: Rt MCA aneurysm clipping  Intraoperative aneurysm  rupture s/p decompressive craniectomy Postop cerebral edema with hemorrhagic transformation and mass effect Neurosurgery following Neurological status seems to be waxing and waning with periods of wakefulness, somnolence and restlessness. Somnolent at the time of my evaluation.  Has left dense hemiplegia. Continue to monitor for neurological recovery Currently on IV Keppra 500 mg twice daily  Essential hypertension PTA noncompliant to meds Postprocedure, patient was on Cardene drip. Blood pressure in 150s mostly. Currently on nimodipine 60 mg every 4 hours.  Noted triamterene/HCTZ was stopped by neurosurgery this morning.  I started her on Coreg 3.125 mg twice daily.   SBP goal <14mmHg  Postop respiratory insufficiency Extubated 4/20.  Currently on room air.  Haemophilus influenza/Enterobacter pneumonia Tracheal aspirate showed haemophilus influenza and Enterobacter.  Currently on cefepime IV  Dysphagia Currently on NG tube feeding.  Noted a plan to put Cortrak today.  If no improvement in mental status and swallowing ability next few days, may benefit from PEG tube placement next week. Recent Labs  Lab 06/07/22 1916 06/07/22 2308 06/08/22 0316 06/08/22 0827 06/08/22 1145  GLUCAP 126* 160* 135* 157* 166*    Agitation Continue PRN Klonopin 0.5 mg twice daily and PRN oxycodone  TID   Abdominal Distention KUB without obstruction Continue tube feeds and meds per tube Monitor bowel function   Urinary rentention Foley catheter was removed on 4/22 but could not pass voiding trial.  Foley reinserted 4/24.  Mobility: Needs PT eval continued  Goals of care   Code Status: Full Code     DVT prophylaxis:  heparin injection 5,000 Units Start: 06/07/22 0915 SCDs Start: 06/02/22 0847 SCD's Start: 05/31/22 1320   Antimicrobials: Currently on  IV cefepime Fluid: Ongoing tube feeding Family Communication: Mother at bedside  Diet:  Diet Order     None       Scheduled  Meds:  carvedilol  3.125 mg Per Tube BID WC   Chlorhexidine Gluconate Cloth  6 each Topical Daily   feeding supplement (PROSource TF20)  60 mL Per Tube Daily   fiber supplement (BANATROL TF)  60 mL Per Tube BID   folic acid  1 mg Per Tube Daily   heparin injection (subcutaneous)  5,000 Units Subcutaneous Q8H   insulin aspart  0-15 Units Subcutaneous Q4H   levETIRAcetam  500 mg Per Tube BID   niMODipine  60 mg Oral Q4H   Or   niMODipine  60 mg Per Tube Q4H   mouth rinse  15 mL Mouth Rinse 4 times per day   sodium chloride flush  10-40 mL Intracatheter Q12H   thiamine  100 mg Per Tube Daily    PRN meds: sodium chloride, acetaminophen **OR** acetaminophen (TYLENOL) oral liquid 160 mg/5 mL **OR** acetaminophen, clonazePAM, hydrALAZINE, mouth rinse, oxyCODONE, sodium chloride flush   Infusions:   sodium chloride 10 mL/hr at 06/08/22 1223   ceFEPime (MAXIPIME) IV Stopped (06/08/22 0708)   feeding supplement (OSMOLITE 1.5 CAL) 45 mL/hr at 06/08/22 1200    Antimicrobials: Anti-infectives (From admission, onward)    Start     Dose/Rate Route Frequency Ordered Stop   06/05/22 1400  ceFEPIme (MAXIPIME) 2 g in sodium chloride 0.9 % 100 mL IVPB        2 g 200 mL/hr over 30 Minutes Intravenous Every 8 hours 06/05/22 1256 06/11/22 2359   06/04/22 0830  Ampicillin-Sulbactam (UNASYN) 3 g in sodium chloride 0.9 % 100 mL IVPB  Status:  Discontinued        3 g 200 mL/hr over 30 Minutes Intravenous Every 6 hours 06/04/22 0733 06/05/22 1256   05/31/22 0611  ceFAZolin (ANCEF) 2-4 GM/100ML-% IVPB       Note to Pharmacy: Amanda Pea M: cabinet override      05/31/22 0611 05/31/22 0841   05/31/22 0600  ceFAZolin (ANCEF) IVPB 2g/100 mL premix        2 g 200 mL/hr over 30 Minutes Intravenous On call to O.R. 05/31/22 0553 05/31/22 0816       Nutritional status:  Body mass index is 20.28 kg/m.  Nutrition Problem: Inadequate oral intake Etiology: inability to eat Signs/Symptoms: NPO  status     Objective: Vitals:   06/08/22 1100 06/08/22 1200  BP: (!) 163/95 138/89  Pulse: 92 92  Resp: 17 19  Temp:  (!) 97.5 F (36.4 C)  SpO2: 96% 96%    Intake/Output Summary (Last 24 hours) at 06/08/2022 1244 Last data filed at 06/08/2022 1200 Gross per 24 hour  Intake 1343.76 ml  Output 725 ml  Net 618.76 ml    Filed Weights   06/06/22 0500 06/07/22 0300 06/08/22 0457  Weight: 50.4 kg 52.6 kg 53.6 kg   Weight change: 1 kg Body mass index is 20.28 kg/m.   Physical Exam: General exam: Young African-American female.  Not in pain Skin: No rashes, lesions or ulcers. HEENT: Atraumatic, normocephalic, no obvious bleeding.  NG tube in place Lungs: Clear to auscultation bilaterally CVS: Regular rate and rhythm, no murmur GI/Abd: soft, nontender, mild distention present.  Bowel sound present. CNS: Remains somnolent at the time of my eval as described above Psychiatry: Unable to examine because of altered mentation Extremities: No pedal edema, no  calf tenderness  Data Review: I have personally reviewed the laboratory data and studies available.  F/u labs ordered Unresulted Labs (From admission, onward)     Start     Ordered   06/08/22 0500  Basic metabolic panel  Tomorrow morning,   R       Question:  Specimen collection method  Answer:  Unit=Unit collect   06/07/22 1051            Total time spent in review of labs and imaging, patient evaluation, formulation of plan, documentation and communication with family: 45 minutes  Signed, Lorin Glass, MD [facility] 06/08/2022

## 2022-06-08 NOTE — Progress Notes (Signed)
Occupational Therapy Treatment Patient Details Name: Kristin Simmons MRN: [id number] DOB: [birth year] Today's Date: 06/08/2022   History of present illness Pt is a 37yo female who was admitted for elective coiling of R MCA aneurysm however this was complicated by intraoperative rupture resulting in a R pterional craniectomy for clipping of MCA aneurysm. Placement of bone flap in subcutaneous abdominal pocket. PMH: anxiety, anemia, HTN, depression   OT comments  Patient seen in conjunction with PT to advance with EOB ADLs and transition OOB. Patient remains max to total A of 2 to transition to EOB (poor motor planning throughout) though much more communicative in session. Patient with L lateral lean and need for significant assist in order to bring head up and into midline with tightness noted with cervical ROM. Patient continues to demonstrate difficulty keeping eyes open, however with OT manually lifting lids patient with R gaze preference. With max cues and head positioning patient can get to midline with gaze, however continues to to be unable to coordinate tracking. Patient perseverating on "get me the yellow tape" in session but benefiting from having mother and daughter present to re-orient. OT recommendations remain appropriate, OT will continue to follow.    Recommendations for follow up therapy are one component of a multi-disciplinary discharge planning process, led by the attending physician.  Recommendations may be updated based on patient status, additional functional criteria and insurance authorization.    Assistance Recommended at Discharge Frequent or constant Supervision/Assistance  Patient can return home with the following  Two people to help with walking and/or transfers;Assistance with cooking/housework;Assistance with feeding;Direct supervision/assist for medications management;Direct supervision/assist for financial management;Assist for transportation;Help with stairs or ramp  for entrance;A lot of help with bathing/dressing/bathroom   Equipment Recommendations  Other (comment) (will continue to assess)    Recommendations for Other Services      Precautions / Restrictions Precautions Precautions: Fall Precaution Comments: R craniectomy, no bone flap, bone flap placed in abdomen, R mitt Restrictions Weight Bearing Restrictions: No       Mobility Bed Mobility Overal bed mobility: Needs Assistance Bed Mobility: Rolling, Sidelying to Sit Rolling: Max assist, +2 for physical assistance Sidelying to sit: Total assist, +2 for physical assistance   Sit to supine: Max assist, +2 for physical assistance   General bed mobility comments: initiating with rolling, but assist for legs off bed and heavy lifting for trunk; to supine in prep for OOB and assist to lower trunk and lift L LE    Transfers Overall transfer level: Needs assistance Equipment used: None Transfers: Bed to chair/wheelchair/BSC     Squat pivot transfers: Total assist, +2 physical assistance       General transfer comment: up to recliner with lifting and placing in chair and scooting to reposition     Balance Overall balance assessment: Needs assistance Sitting-balance support: Feet supported, Single extremity supported Sitting balance-Leahy Scale: Poor Sitting balance - Comments: leans L and head flexed to R Postural control: Left lateral lean   Standing balance-Leahy Scale: Zero Standing balance comment: +2 for up OOB                           ADL either performed or assessed with clinical judgement   ADL Overall ADL's : Needs assistance/impaired Eating/Feeding: NPO   Grooming: Wash/dry face;Sitting;Oral care;Minimal assistance Grooming Details (indicate cue type and reason): Patient completing teeth brushing at min A, min A to wipe face, though inconsistent and requiring cues  with regard to awareness                 Toilet Transfer: +2 for physical  assistance;+2 for safety/equipment;Moderate assistance Toilet Transfer Details (indicate cue type and reason): mod A to complete stand pivot, more for lines and leads and minimal impulsivity Toileting- Clothing Manipulation and Hygiene: Total assistance Toileting - Clothing Manipulation Details (indicate cue type and reason): rectal tube and foley currently       General ADL Comments: Session focus on EOB ADLs and transitioniong to OOB to recliner    Extremity/Trunk Assessment              Vision   Vision Assessment?: Vision impaired- to be further tested in functional context Additional Comments: Patient continues to demonstrate difficulty keeping eyes open, however with OT manually lifting lids patient with R gaze preference. With max cues and head positioning patient can get to midline with gaze, however continues to to be unable to coordinate tracking.   Perception     Praxis      Cognition Arousal/Alertness: Lethargic Behavior During Therapy: Restless Overall Cognitive Status: Impaired/Different from baseline Area of Impairment: Attention, Following commands, Safety/judgement, Problem solving, Awareness, Memory                   Current Attention Level: Focused Memory: Decreased short-term memory Following Commands: Follows one step commands consistently, Follows one step commands with increased time Safety/Judgement: Decreased awareness of safety, Decreased awareness of deficits Awareness: Intellectual Problem Solving: Slow processing, Decreased initiation, Difficulty sequencing, Requires verbal cues, Requires tactile cues General Comments: following commands to wipe her mouth due to not managing secretions and occasionally to open her eyes though needing multimodal cues; somewhat lethargic, though talking a lot and complaining some with mother continuing to cue her to stay awake, keep eyes open, etc        Exercises      Shoulder Instructions        General Comments mother and one daughter present; pt engaging with another family member via face time.  VSS and on continuous EEG though mother noted to be removed and RN reports coretrak to be placed (currently with larger NG tube)    Pertinent Vitals/ Pain       Pain Assessment Pain Assessment: Faces Faces Pain Scale: Hurts little more Pain Location: neck, head Pain Descriptors / Indicators: Discomfort, Sore Pain Intervention(s): Limited activity within patient's tolerance, Monitored during session, Repositioned  Home Living                                          Prior Functioning/Environment              Frequency  Min 2X/week        Progress Toward Goals  OT Goals(current goals can now be found in the care plan section)  Progress towards OT goals: Progressing toward goals  Acute Rehab OT Goals Patient Stated Goal: to get my yellow tape OT Goal Formulation: Patient unable to participate in goal setting Time For Goal Achievement: 06/15/22 Potential to Achieve Goals: Good  Plan Discharge plan remains appropriate    Co-evaluation      Reason for Co-Treatment: Complexity of the patient's impairments (multi-system involvement);Necessary to address cognition/behavior during functional activity;For patient/therapist safety;To address functional/ADL transfers PT goals addressed during session: Mobility/safety with mobility;Balance  AM-PAC OT "6 Clicks" Daily Activity     Outcome Measure   Help from another person eating meals?: Total Help from another person taking care of personal grooming?: A Little Help from another person toileting, which includes using toliet, bedpan, or urinal?: Total Help from another person bathing (including washing, rinsing, drying)?: A Lot Help from another person to put on and taking off regular upper body clothing?: A Lot Help from another person to put on and taking off regular lower body clothing?:  Total 6 Click Score: 10    End of Session Equipment Utilized During Treatment: Gait belt  OT Visit Diagnosis: Muscle weakness (generalized) (M62.81);Other abnormalities of gait and mobility (R26.89);Unsteadiness on feet (R26.81);Other symptoms and signs involving cognitive function;Hemiplegia and hemiparesis Hemiplegia - Right/Left: Left Hemiplegia - dominant/non-dominant: Non-Dominant Hemiplegia - caused by: Other cerebrovascular disease   Activity Tolerance Patient tolerated treatment well   Patient Left in chair;with call bell/phone within reach;with chair alarm set;with family/visitor present   Nurse Communication Mobility status        Time: 1108-1150 OT Time Calculation (min): 42 min  Charges: OT General Charges $OT Visit: 1 Visit OT Treatments $Self Care/Home Management : 8-22 mins  Pollyann Glen E. , OTR/L Acute Rehabilitation Services [phone]   Cherlyn Cushing 06/08/2022, 4:16 PM

## 2022-06-08 NOTE — Procedures (Signed)
Cortrak  Person Inserting Tube:  ,  T, RD Tube Type:  Cortrak - 43 inches Tube Size:  10 Tube Location:  Left nare Secured by: Bridle Technique Used to Measure Tube Placement:  Marking at nare/corner of mouth Cortrak Secured At:  67 cm    Cortrak Tube Team Note:  Consult received to place a Cortrak feeding tube.   X-ray is required, abdominal x-ray has been ordered by the Cortrak team. Please confirm tube placement before using the Cortrak tube.   If the tube becomes dislodged please keep the tube and contact the Cortrak team at [email or web address] for replacement.  If after hours and replacement cannot be delayed, place a NG tube and confirm placement with an abdominal x-ray.      RD, LDN For contact information, refer to [facility].    

## 2022-06-09 DIAGNOSIS — R569 Unspecified convulsions: Secondary | ICD-10-CM | POA: Diagnosis not present

## 2022-06-09 DIAGNOSIS — Z9889 Other specified postprocedural states: Secondary | ICD-10-CM | POA: Diagnosis not present

## 2022-06-09 LAB — GLUCOSE, CAPILLARY
Glucose-Capillary: 134 mg/dL — ABNORMAL HIGH (ref 70–99)
Glucose-Capillary: 134 mg/dL — ABNORMAL HIGH (ref 70–99)
Glucose-Capillary: 116 mg/dL — ABNORMAL HIGH (ref 70–99)
Glucose-Capillary: 116 mg/dL — ABNORMAL HIGH (ref 70–99)
Glucose-Capillary: 138 mg/dL — ABNORMAL HIGH (ref 70–99)
Glucose-Capillary: 139 mg/dL — ABNORMAL HIGH (ref 70–99)

## 2022-06-09 NOTE — Progress Notes (Signed)
Speech Language Pathology Treatment: Dysphagia  Patient Details Name: Kristin Simmons MRN: [id number] DOB: 08-27-1985 Today's Date: 06/09/2022 Time: 4098-1191 SLP Time Calculation (min) (ACUTE ONLY): 15 min  Assessment / Plan / Recommendation Clinical Impression  Pt has had more periods of wakefulness, talking in full sentences. Unfortunately, she was quite drowsy during our session. She alerted sufficiently to attempt some PO trials, but despite moderate cues she demonstrated ongoing oral spillage due to impaired awareness, decreased lip seal, and coughing after 100% of thin liquid teaspoon trials. Not ready for instrumental swallow study nor POs. D/W her mom at bedside - she is making progress, just to soon to start POs due to inconsistent mentation and aspiration risk.  Her mom agrees with recommendations. SLP will continue to follow for communication and swallowing. D/W RN.   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 She remained on vent post op. ETT 4/16-4/20. Placement of bone flap in subcutaneous abdominal pocket. PMH: anxiety, anemia, HTN, depression.      SLP Plan  Continue with current plan of care      Recommendations for follow up therapy are one component of a multi-disciplinary discharge planning process, led by the attending physician.  Recommendations may be updated based on patient status, additional functional criteria and insurance authorization.    Recommendations  Diet  recommendations: NPO Medication Administration: Via alternative means                  Oral care prior to ice chip/H20;Oral care QID   Frequent or constant Supervision/Assistance Dysphagia, oropharyngeal phase (R13.12)     Continue with current plan of care     L. Samson Frederic, MA CCC/SLP Clinical Specialist - Acute Care SLP Acute Rehabilitation Services Office number [phone]  Blenda Mounts Laurice  06/09/2022, 10:04 AM

## 2022-06-09 NOTE — Progress Notes (Signed)
  NEUROSURGERY PROGRESS NOTE   Pt seen and examined. No issues overnight.  EXAM: Temp:  [97.5 F (36.4 C)-99.3 F (37.4 C)] 98.3 F (36.8 C) (04/25 0800) Pulse Rate:  [88-116] 88 (04/25 0800) Resp:  [13-31] 21 (04/25 0800) BP: (122-179)/(75-114) 138/78 (04/25 0800) SpO2:  [94 %-98 %] 98 % (04/25 0800) Weight:  [57.4 kg] 57.4 kg (04/25 0500) Intake/Output      04/24 0701 04/25 0700 04/25 0701 04/26 0700   I.V. (mL/kg) 175.5 (3.1) 14.8 (0.3)   NG/GT 945 90   IV Piggyback 130.6 100   Total Intake(mL/kg) 1251.1 (21.8) 204.8 (3.6)   Urine (mL/kg/hr) 950 (0.7)    Stool 100    Total Output 1050    Net +201.1 +204.8         Awake, much more interactive today Pupils 2mm, reactive Answers simple questions, oriented to person, hospital, speaking in sentences Following commands RUE/RLE, moves spontaneously and purposefully Per RN, was following some commands cont to w/d on left Wound c/d/I, softer today  LABS: Lab Results  Component Value Date   CREATININE 0.60 06/08/2022   BUN 23 (H) 06/08/2022   NA 127 (L) 06/08/2022   K 4.7 06/08/2022   CL 95 (L) 06/08/2022   CO2 21 (L) 06/08/2022   Lab Results  Component Value Date   WBC 14.7 (H) 06/08/2022   HGB 13.8 06/08/2022   HCT 39.9 06/08/2022   MCV 89.3 06/08/2022   PLT 486 (H) 06/08/2022     TCD: Date POD PCO2 HCT BP   MCA ACA PCA OPHT SIPH VERT Basilar  4/17,rs         Right  Left   41  59   -35  -37   47  40   -25  -28   -36       4/19 GC         Right  Left   77  73   -35  -45   11  49   19  19   48  20   *  *   *       4/22  [facility]       139/86  Right  Left    118   83    *   -27   *   19    *   *    *   *    -26   -39    -76        4/24  [facility]        142/101 Right  Left    140   76   -22   -44    45   46    26   26    61   30    *   -30    -47        IMPRESSION: - 37 y.o. female POD#9 with intraoperative aneurysm rupture during opening. Exam  appears to be slowly improving - VAP - Hyponatermia  PLAN: - No SZ overnight, can likely d/c LTEEG today, will cont Keppra - Continue Nimotop - May need to re-start hypertonic saline for hyponatremia - Cont PT/OT - Cont abx per PCCM   Lisbeth Renshaw, MD [facility]

## 2022-06-09 NOTE — Progress Notes (Signed)
LTM maint complete - no skin breakdown under:  FP1,FP2  

## 2022-06-09 NOTE — Progress Notes (Signed)
EEG LTM D/C'd. Patient had no skin breakdown. [facility] was notified.  

## 2022-06-09 NOTE — Procedures (Addendum)
Patient Name: Kristin Simmons  MRN: [id number]  Epilepsy Attending: Charlsie Quest  Referring Physician/Provider: Lisbeth Renshaw, MD  Duration: 06/08/2022 1324 to 06/09/2022 1323   Patient history: 37 y.o. female POD#6 with intraoperative aneurysm rupture during opening. Exam appears subtly improved, now with some left-sided movement although it appears her exam waxes and wanes. EEG to evaluate for seizure   Level of alertness: Awake,asleep   AEDs during EEG study: LEV   Technical aspects: This EEG study was done with scalp electrodes positioned according to the 10-20 International system of electrode placement. Electrical activity was reviewed with band pass filter of 1-70Hz , sensitivity of 7 uV/mm, display speed of 9mm/sec with a 60Hz  notched filter applied as appropriate. EEG data were recorded continuously and digitally stored.  Video monitoring was available and reviewed as appropriate.   Description: The posterior dominant rhythm consists of 10 Hz activity of moderate voltage (25-35 uV) seen predominantly in posterior head regions, asymmetric ( right<left) and reactive to eye opening and eye closing. Sleep was characterized by vertex waves, sleep spindles (12 to 14 Hz), maximal frontocentral region. EEG showed continuous 2-3 Hz delta slowing right hemisphere. Hyperventilation and photic stimulation were not performed.      ABNORMALITY - Continuous slow, right hemisphere   IMPRESSION: This study is suggestive of cortical dysfunction arising from right hemisphere likely secondary to underlying structural abnormality. No seizures or epileptiform discharges were seen throughout the recording.    Annabelle Harman

## 2022-06-09 NOTE — Progress Notes (Signed)
Physical Therapy Treatment Patient Details Name: Kristin Simmons MRN: [id number] DOB: [birth year] Today's Date: 06/09/2022   History of Present Illness Pt is a 37yo female who was admitted for elective coiling of R MCA aneurysm however this was complicated by intraoperative rupture resulting in a R pterional craniectomy for clipping of MCA aneurysm. Placement of bone flap in subcutaneous abdominal pocket. PMH: anxiety, anemia, HTN, depression    PT Comments    Patient back OOB to chair today and placed on her R side to improve visual recognition and initiate pt participation.  She perseverating on wanting to play bingo, she still recognized in the hospital, but not that she could not leave to go play bingo.  She continues to need assist to manage secretions and on EOB briefly not taking control of head despite cues and assist several minutes. She remains appropriate for post-acute intensive inpatient rehab at d/c.   Recommendations for follow up therapy are one component of a multi-disciplinary discharge planning process, led by the attending physician.  Recommendations may be updated based on patient status, additional functional criteria and insurance authorization.  Follow Up Recommendations       Assistance Recommended at Discharge Frequent or constant Supervision/Assistance  Patient can return home with the following Two people to help with walking and/or transfers;A lot of help with bathing/dressing/bathroom;Assistance with cooking/housework;Direct supervision/assist for medications management;Direct supervision/assist for financial management;Assist for transportation;Help with stairs or ramp for entrance   Equipment Recommendations  Other (comment) (TBA)    Recommendations for Other Services       Precautions / Restrictions Precautions Precautions: Fall Precaution Comments: R craniectomy, no bone flap, bone flap placed in abdomen, R mitt     Mobility  Bed Mobility Overal  bed mobility: Needs Assistance Bed Mobility: Sidelying to Sit, Rolling Rolling: Mod assist Sidelying to sit: Total assist, +2 for physical assistance       General bed mobility comments: initiating with rolling, but assist for legs off bed and heavy lifting for trunk; to supine in prep for OOB and assist to lower trunk and lift L LE    Transfers Overall transfer level: Needs assistance Equipment used: Rolling walker (2 wheels) Transfers: Bed to chair/wheelchair/BSC Sit to Stand: Total assist, Max assist, +2 safety/equipment     Squat pivot transfers: Total assist, +2 physical assistance     General transfer comment: today pivot to R toward strong side with pt able to assist some though perseverating on leaving to go play bingo    Ambulation/Gait                   Stairs             Wheelchair Mobility    Modified Rankin (Stroke Patients Only)       Balance Overall balance assessment: Needs assistance Sitting-balance support: Feet supported Sitting balance-Leahy Scale: Poor Sitting balance - Comments: mod to max A for sitting balance and A of another for head control   Standing balance support: Bilateral upper extremity supported Standing balance-Leahy Scale: Zero Standing balance comment: +2 for standing                            Cognition Arousal/Alertness: Lethargic Behavior During Therapy: Restless Overall Cognitive Status: Impaired/Different from baseline Area of Impairment: Attention, Following commands, Safety/judgement, Problem solving, Awareness, Memory                   Current  Attention Level: Focused Memory: Decreased short-term memory Following Commands: Follows one step commands with increased time, Follows one step commands consistently, Follows one step commands inconsistently Safety/Judgement: Decreased awareness of safety, Decreased awareness of deficits Awareness: Intellectual Problem Solving: Slow  processing, Decreased initiation, Difficulty sequencing, Requires verbal cues, Requires tactile cues General Comments: needs frequent redirection to not pull at lines or touch her head incision; she had eye partly open about 30% of session today,  R gaze preference able to bring eyes to midline briefly        Exercises Other Exercises Other Exercises: PROM L LE and L UE    General Comments General comments (skin integrity, edema, etc.): reports she made her mother leave since she was talking too much; calling out for her daughter      Pertinent Vitals/Pain Pain Assessment Pain Assessment: Faces Faces Pain Scale: Hurts little more Pain Location: neck, head Pain Descriptors / Indicators: Aching, Discomfort Pain Intervention(s): Monitored during session, Repositioned, Patient requesting pain meds-RN notified    Home Living                          Prior Function            PT Goals (current goals can now be found in the care plan section) Progress towards PT goals: Progressing toward goals    Frequency    Min 4X/week      PT Plan Current plan remains appropriate    Co-evaluation              AM-PAC PT "6 Clicks" Mobility   Outcome Measure  Help needed turning from your back to your side while in a flat bed without using bedrails?: Total Help needed moving from lying on your back to sitting on the side of a flat bed without using bedrails?: Total Help needed moving to and from a bed to a chair (including a wheelchair)?: Total Help needed standing up from a chair using your arms (e.g., wheelchair or bedside chair)?: Total Help needed to walk in hospital room?: Total Help needed climbing 3-5 steps with a railing? : Total 6 Click Score: 6    End of Session Equipment Utilized During Treatment: Gait belt Activity Tolerance: Patient tolerated treatment well Patient left: in chair;with chair alarm set;with call bell/phone within reach;with restraints  reapplied   PT Visit Diagnosis: Unsteadiness on feet (R26.81);Hemiplegia and hemiparesis Hemiplegia - Right/Left: Left Hemiplegia - dominant/non-dominant: Non-dominant Hemiplegia - caused by: Nontraumatic intracerebral hemorrhage     Time: 9147-8295 PT Time Calculation (min) (ACUTE ONLY): 42 min  Charges:  $Therapeutic Exercise: 8-22 mins $Therapeutic Activity: 23-37 mins                     Kristin Simmons, PT Acute Rehabilitation Services [phone] 06/09/2022    Kristin Simmons 06/09/2022, 3:36 PM

## 2022-06-09 NOTE — Progress Notes (Signed)
RN was bathing patient when it appears that patient was having bloody discharge from head.  RN lightly wrapped her head with krellix and MD Kristin Simmons was alerted to this.  Orders were given to keep her head covered.  This RN will continue to assess and intervene if necessary.

## 2022-06-09 NOTE — Progress Notes (Signed)
PROGRESS NOTE  Kristin Simmons  DOB: May 31, 1985  PCP: Tylene Fantasia., PA-C ONG:[id number]  DOA: 05/31/2022  LOS: 9 days  Hospital Day: 10  Brief narrative: Kristin Simmons is a 37 y.o. female with PMH significant for chronic smoking, HTN, cervical cancer, anxiety/depression, and poor compliance to medicines Few months ago, patient had dental pain and had made frequent trips to the ED.  As part of the workup I will see underwent maxillofacial CT with contrast which incidentally discovered right carotid aneurysm.  She was referred to neurosurgery.   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  At that time, she underwent coil embolization of the right ophthalmic aneurysm.  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  4/16, she was electively admitted by neurosurgery and underwent an right pterional craniectomy for clipping of MCA aneurysm, complex due to intraoperative rupture and need for temporary MCA occlusion. Postprocedure, she stayed on ventilator, moved to ICU and was also started on Cardene drip. 4/18 CT head showed increased anterior and middle division Rt MCA territory edema with hemorrhagic transformation, increased mass effect with 4 mm leftward shift 4/20 extubated 4/23, transferred out to [facility]   Subjective: Patient was seen and examined this morning.   Remains somnolent at the time of my evaluation.  Mom and RN at bedside.  Mom states she is just not a morning person.   Patient woke up yesterday afternoon and was out of the chair.  Was making sense when she talked to the family.  Continues to have left hemiplegia.  Cortrak tube feeding continues.  Foley catheter draining clear urine Heart rate continues to elevated over  100, blood pressure low normal.  Assessment and plan: Rt MCA aneurysm clipping  Intraoperative aneurysm rupture s/p decompressive craniectomy Postop cerebral edema with hemorrhagic transformation and mass effect Neurosurgery following Neurological status seems to be waxing and waning with periods of wakefulness, somnolence and restlessness. Somnolent at the time of my evaluation.  But mostly awake during the day per family and staff.  Continues to have left dense hemiplegia. Continue to monitor for neurological progress Currently on IV Keppra 500 mg twice daily  Hyponatremia Sodium trend as below.  Was down to 127 yesterday.  HCTZ/triamterene was stopped yesterday.  Repeat lab tomorrow.  If continues to downtrend, may need 3% saline again. Recent Labs  Lab 06/03/22 0606 06/03/22 1116 06/03/22 1944 06/04/22 0225 06/04/22 0743 06/05/22 0726 06/05/22 0929 06/06/22 1926 06/07/22 0833 06/08/22 1107  NA 138 139 141 137 136 147* 138 130* 131* 127*    Sinus tachycardia  Essential hypertension Postprocedure, patient had elevated blood pressure and briefly required Cardene drip.  Currently on Coreg 3.125 mg twice daily and nimodipine 60 mg every 4 hours.  Continue to monitor heart rate and blood pressure.  SBP goal <158mmHg  Postop respiratory insufficiency Extubated 4/20.  Currently on room air.  Haemophilus influenza/Enterobacter pneumonia Tracheal aspirate showed haemophilus influenza and Enterobacter.  Currently on cefepime IV  Dysphagia Currently on Cortrak tube feeding.  Since her mental status is intermittently better, I would hold off on PEG tube feeding for now.  SLP to follow. Recent Labs  Lab 06/08/22 1705 06/08/22 1920 06/08/22 2313 06/09/22 0318 06/09/22 0750  GLUCAP 138* 145* 185* 134* 134*  Agitation Continue PRN Klonopin 0.5 mg twice daily and PRN oxycodone  TID   Abdominal Distention KUB without obstruction Continue tube feeds and meds per  tube Monitor bowel function   Urinary rentention Foley catheter was removed on 4/22 but could not pass voiding trial.  Foley reinserted 4/24.  Mobility: Needs PT eval continued  Goals of care   Code Status: Full Code     DVT prophylaxis:  heparin injection 5,000 Units Start: 06/07/22 0915 SCDs Start: 06/02/22 0847 SCD's Start: 05/31/22 1320   Antimicrobials: Currently on IV cefepime Fluid: Ongoing tube feeding Family Communication: Mother at bedside  Diet:  Diet Order     None       Scheduled Meds:  carvedilol  3.125 mg Per Tube BID WC   Chlorhexidine Gluconate Cloth  6 each Topical Daily   feeding supplement (PROSource TF20)  60 mL Per Tube Daily   fiber supplement (BANATROL TF)  60 mL Per Tube BID   folic acid  1 mg Per Tube Daily   heparin injection (subcutaneous)  5,000 Units Subcutaneous Q8H   insulin aspart  0-15 Units Subcutaneous Q4H   levETIRAcetam  500 mg Per Tube BID   niMODipine  60 mg Oral Q4H   Or   niMODipine  60 mg Per Tube Q4H   mouth rinse  15 mL Mouth Rinse 4 times per day   sodium chloride flush  10-40 mL Intracatheter Q12H   thiamine  100 mg Per Tube Daily    PRN meds: sodium chloride, acetaminophen **OR** acetaminophen (TYLENOL) oral liquid 160 mg/5 mL **OR** acetaminophen, clonazePAM, hydrALAZINE, mouth rinse, oxyCODONE, sodium chloride flush   Infusions:   sodium chloride 10 mL/hr at 06/09/22 0800   ceFEPime (MAXIPIME) IV Stopped (06/09/22 4098)   feeding supplement (OSMOLITE 1.5 CAL) 45 mL/hr at 06/09/22 0800    Antimicrobials: Anti-infectives (From admission, onward)    Start     Dose/Rate Route Frequency Ordered Stop   06/05/22 1400  ceFEPIme (MAXIPIME) 2 g in sodium chloride 0.9 % 100 mL IVPB        2 g 200 mL/hr over 30 Minutes Intravenous Every 8 hours 06/05/22 1256 06/11/22 2359   06/04/22 0830  Ampicillin-Sulbactam (UNASYN) 3 g in sodium chloride 0.9 % 100 mL IVPB  Status:  Discontinued        3 g 200 mL/hr over 30  Minutes Intravenous Every 6 hours 06/04/22 0733 06/05/22 1256   05/31/22 0611  ceFAZolin (ANCEF) 2-4 GM/100ML-% IVPB       Note to Pharmacy: Amanda Pea M: cabinet override      05/31/22 0611 05/31/22 0841   05/31/22 0600  ceFAZolin (ANCEF) IVPB 2g/100 mL premix        2 g 200 mL/hr over 30 Minutes Intravenous On call to O.R. 05/31/22 0553 05/31/22 0816       Nutritional status:  Body mass index is 21.72 kg/m.  Nutrition Problem: Inadequate oral intake Etiology: inability to eat Signs/Symptoms: NPO status     Objective: Vitals:   06/09/22 0700 06/09/22 0800  BP: (!) 141/75 138/78  Pulse: (!) 112 88  Resp: 19 (!) 21  Temp:  98.3 F (36.8 C)  SpO2: 98% 98%    Intake/Output Summary (Last 24 hours) at 06/09/2022 1002 Last data filed at 06/09/2022 0800 Gross per 24 hour  Intake 1262.87 ml  Output 850 ml  Net 412.87 ml    Filed Weights   06/07/22 0300 06/08/22 0457 06/09/22 0500  Weight: 52.6  kg 53.6 kg 57.4 kg   Weight change: 3.8 kg Body mass index is 21.72 kg/m.   Physical Exam: General exam: Young African-American female.  Not in pain Skin: No rashes, lesions or ulcers. HEENT: Atraumatic, normocephalic, no obvious bleeding.  NG tube in place Lungs: Clear to auscultation bilaterally CVS: Regular rate and rhythm, no murmur GI/Abd: soft, nontender, mild distention present.  Bowel sound present. CNS: Remains somnolent at the time of my eval.  Awake more during the day per family and staff. Psychiatry: Unable to examine because of altered mentation Extremities: No pedal edema, no calf tenderness  Data Review: I have personally reviewed the laboratory data and studies available.  F/u labs ordered Unresulted Labs (From admission, onward)     Start     Ordered   06/10/22 0500  CBC with Differential/Platelet  Tomorrow morning,   R       Question:  Specimen collection method  Answer:  Lab=Lab collect   06/09/22 0757   06/10/22 0500  Basic metabolic panel   Tomorrow morning,   R       Question:  Specimen collection method  Answer:  Lab=Lab collect   06/09/22 0757            Total time spent in review of labs and imaging, patient evaluation, formulation of plan, documentation and communication with family: 45 minutes  Signed, Lorin Glass, MD [facility] 06/09/2022

## 2022-06-09 NOTE — Progress Notes (Addendum)
Nutrition Follow-up  DOCUMENTATION CODES:   Not applicable  INTERVENTION:   PEPuP candidate  Tube feeding via Cortrak tube: Osmolite 1.5 @ 45 ml/hr (1080 ml per day) Prosource TF20 60 ml daily  Provides 1700 kcal, 87 gm protein, 820 ml free water daily  Continue 100 mg thiamine daily per tube and 1 mg folic acid daily per tube   Continue Banatrol Pt with diarrhea due to nimotop    NUTRITION DIAGNOSIS:   Inadequate oral intake related to inability to eat as evidenced by NPO status. Ongoing.   GOAL:   Patient will meet greater than or equal to 90% of their needs Met with TF at goal  MONITOR:   TF tolerance  REASON FOR ASSESSMENT:   Consult Enteral/tube feeding initiation and management  ASSESSMENT:   Pt with PMH of current smoker, ETOH use - drinks 2 beers per day, anemia, HTN, anxiety, asthma, cervial cancer, depression, and headaches admitted with R carotid aneurysm s/p coil embolization. 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   Pt discussed during ICU rounds and with RN.  Pt still with dense L hemiplegia and somnolence.  Per neurosurgery may need hypertonic saline for low Na  04/16 - s/p craniectomy and clipping of R MCA aneurysm  04/17 - s/p cortrak tube placement; per xray tip in body/antral region of the stomach  04/20 - extubated 04/24 - cortrak replaced; tip in gastric antrum per xray   Medications reviewed and include: banatrol, folic acid, SSI, keppra, nimotop, thiamine   Labs reviewed: Na 127 CBG's: 134-185    Diet Order:   Diet Order     None       EDUCATION NEEDS:   Not appropriate for education at this time  Skin:  Skin Assessment: Skin Integrity Issues: Skin Integrity Issues:: Incisions Incisions: head and abd  Last BM:  100 ml via FMS, pt on nimotop per tube  Height:   Ht Readings from Last 1 Encounters:  05/31/22  (1.626 m)    Weight:   Wt Readings from  Last 1 Encounters:  06/09/22 57.4 kg    BMI:  Body mass index is 21.72 kg/m.  Estimated Nutritional Needs:   Kcal:  1700-1900  Protein:  75-90 grams  Fluid:  >1.7 L/day  Cammy Copa., RD, LDN, CNSC See AMi[facility] for contact information

## 2022-06-10 ENCOUNTER — Inpatient Hospital Stay (HOSPITAL_COMMUNITY): Payer: Medicaid Other

## 2022-06-10 DIAGNOSIS — Z9889 Other specified postprocedural states: Secondary | ICD-10-CM

## 2022-06-10 DIAGNOSIS — I609 Nontraumatic subarachnoid hemorrhage, unspecified: Secondary | ICD-10-CM

## 2022-06-10 LAB — CBC WITH DIFFERENTIAL/PLATELET
Abs Immature Granulocytes: 0.1 K/uL — ABNORMAL HIGH (ref 0.00–0.07)
Basophils Absolute: 0.1 K/uL (ref 0.0–0.1)
Basophils Relative: 0 %
Eosinophils Absolute: 0.1 K/uL (ref 0.0–0.5)
Eosinophils Relative: 1 %
HCT: 39.7 % (ref 36.0–46.0)
Hemoglobin: 13.5 g/dL (ref 12.0–15.0)
Immature Granulocytes: 1 %
Lymphocytes Relative: 10 %
Lymphs Abs: 1.7 K/uL (ref 0.7–4.0)
MCH: 31.1 pg (ref 26.0–34.0)
MCHC: 34 g/dL (ref 30.0–36.0)
MCV: 91.5 fL (ref 80.0–100.0)
Monocytes Absolute: 0.8 K/uL (ref 0.1–1.0)
Monocytes Relative: 5 %
Neutro Abs: 14 K/uL — ABNORMAL HIGH (ref 1.7–7.7)
Neutrophils Relative %: 83 %
Platelets: 604 K/uL — ABNORMAL HIGH (ref 150–400)
RBC: 4.34 MIL/uL (ref 3.87–5.11)
RDW: 13.6 % (ref 11.5–15.5)
WBC: 16.8 K/uL — ABNORMAL HIGH (ref 4.0–10.5)
nRBC: 0 % (ref 0.0–0.2)

## 2022-06-10 LAB — GLUCOSE, CAPILLARY
Glucose-Capillary: 131 mg/dL — ABNORMAL HIGH (ref 70–99)
Glucose-Capillary: 139 mg/dL — ABNORMAL HIGH (ref 70–99)
Glucose-Capillary: 143 mg/dL — ABNORMAL HIGH (ref 70–99)
Glucose-Capillary: 149 mg/dL — ABNORMAL HIGH (ref 70–99)
Glucose-Capillary: 150 mg/dL — ABNORMAL HIGH (ref 70–99)
Glucose-Capillary: 155 mg/dL — ABNORMAL HIGH (ref 70–99)

## 2022-06-10 LAB — BASIC METABOLIC PANEL WITH GFR
Anion gap: 12 (ref 5–15)
BUN: 24 mg/dL — ABNORMAL HIGH (ref 6–20)
CO2: 18 mmol/L — ABNORMAL LOW (ref 22–32)
Calcium: 9.6 mg/dL (ref 8.9–10.3)
Chloride: 96 mmol/L — ABNORMAL LOW (ref 98–111)
Creatinine, Ser: 0.59 mg/dL (ref 0.44–1.00)
GFR, Estimated: >60 mL/min
Glucose, Bld: 165 mg/dL — ABNORMAL HIGH (ref 70–99)
Potassium: 4.9 mmol/L (ref 3.5–5.1)
Sodium: 126 mmol/L — ABNORMAL LOW (ref 135–145)

## 2022-06-10 MED ORDER — SODIUM CHLORIDE 1 G PO TABS
2.0000 g | ORAL_TABLET | Freq: Three times a day (TID) | ORAL | Status: DC
  Administered 2022-06-10 – 2022-06-12 (×7): 2 g via ORAL
  Filled 2022-06-10 (×7): qty 2

## 2022-06-10 NOTE — Progress Notes (Signed)
Inpatient Rehabilitation Admissions Coordinator    I will place rehab consult for assessment for CIR candidacy.  Ottie Glazier, RN, MSN Rehab Admissions Coordinator [phone] 06/10/2022 2:22 PM

## 2022-06-10 NOTE — Progress Notes (Signed)
Physical Therapy Treatment Patient Details Name: Kristin Simmons MRN: [id number] DOB: 06-Jul-1985 Today's Date: 06/10/2022   History of Present Illness Pt is a 37yo female who was admitted for elective coiling of R MCA aneurysm however this was complicated by intraoperative rupture resulting in a R pterional craniectomy for clipping of MCA aneurysm. Placement of bone flap in subcutaneous abdominal pocket. PMH: anxiety, anemia, HTN, depression    PT Comments    The pt was agreeable to session with focus on progressing OOB movement. The pt completed x3 sit-stand transfers and does demo attempts to assist with RLE when directly cued, but is largely dependent on therapists to maintain upright and provide support at L leg, trunk, and head. The pt was unable to manage steps at this time, and is dependent on therapists to facilitate stand-pivot transfer. Will continue to benefit from PT to improve functional movement and positioning of head, and pt's ability to assist with transfers and movement at this time.     Recommendations for follow up therapy are one component of a multi-disciplinary discharge planning process, led by the attending physician.  Recommendations may be updated based on patient status, additional functional criteria and insurance authorization.  Follow Up Recommendations       Assistance Recommended at Discharge Frequent or constant Supervision/Assistance  Patient can return home with the following Two people to help with walking and/or transfers;A lot of help with bathing/dressing/bathroom;Assistance with cooking/housework;Direct supervision/assist for medications management;Direct supervision/assist for financial management;Assist for transportation;Help with stairs or ramp for entrance   Equipment Recommendations  Other (comment) (defer to post acute)    Recommendations for Other Services       Precautions / Restrictions Precautions Precautions: Fall Precaution  Comments: R craniectomy, no bone flap, bone flap placed in abdomen, R mitt Restrictions Weight Bearing Restrictions: No     Mobility  Bed Mobility Overal bed mobility: Needs Assistance Bed Mobility: Sidelying to Sit, Rolling Rolling: Max assist Sidelying to sit: Total assist, +2 for physical assistance       General bed mobility comments: initiating with rolling, but assist for legs off bed and heavy lifting for trunk; to supine in prep for OOB and assist to lower trunk and lift L LE    Transfers Overall transfer level: Needs assistance Equipment used: 2 person hand held assist Transfers: Sit to/from Stand, Bed to chair/wheelchair/BSC Sit to Stand: +2 safety/equipment, Total assist, Max assist, +2 physical assistance Stand pivot transfers: Total assist, +2 physical assistance         General transfer comment: pt completed x3 sit-stand transfers with max-totalA of 2. does attempt with RLE but is unable to maintain trunk or head position without cues/assist.    Ambulation/Gait               General Gait Details: pt unable      Balance Overall balance assessment: Needs assistance Sitting-balance support: Feet supported Sitting balance-Leahy Scale: Poor Sitting balance - Comments: mod to max A for sitting balance and A of another for head control   Standing balance support: Bilateral upper extremity supported Standing balance-Leahy Scale: Zero Standing balance comment: +2 for standing                            Cognition Arousal/Alertness: Lethargic Behavior During Therapy: Restless Overall Cognitive Status: Impaired/Different from baseline Area of Impairment: Attention, Following commands, Safety/judgement, Problem solving, Awareness, Memory  Current Attention Level: Focused Memory: Decreased short-term memory Following Commands: Follows one step commands with increased time, Follows one step commands consistently, Follows  one step commands inconsistently Safety/Judgement: Decreased awareness of safety, Decreased awareness of deficits Awareness: Intellectual Problem Solving: Slow processing, Decreased initiation, Difficulty sequencing, Requires verbal cues, Requires tactile cues General Comments: pt following commands with increased time, talkative but with non-sensical speech involving triplets, vacation plans, and drugs. Pt does show attempts at initiation of movement with cues as strength/deficits allow.        Exercises Other Exercises Other Exercises: R knee flexion and extension in standing. dependent on totalA of 2 but pt does initiate flexion and extension to command x 5 Other Exercises: cervical ROM and therapeutic massage to neck due to c/o neck pain    General Comments General comments (skin integrity, edema, etc.): HR to 120s with standing attempts, other VSS      Pertinent Vitals/Pain Pain Assessment Pain Assessment: Faces Faces Pain Scale: Hurts little more Pain Location: neack with rotation to L, head Pain Descriptors / Indicators: Aching, Discomfort Pain Intervention(s): Limited activity within patient's tolerance, Monitored during session, Repositioned     PT Goals (current goals can now be found in the care plan section) Acute Rehab PT Goals Patient Stated Goal: none stated PT Goal Formulation: Patient unable to participate in goal setting Time For Goal Achievement: 06/15/22 Potential to Achieve Goals: Good Progress towards PT goals: Progressing toward goals    Frequency    Min 4X/week      PT Plan Current plan remains appropriate    Co-evaluation PT/OT/SLP Co-Evaluation/Treatment: Yes Reason for Co-Treatment: Complexity of the patient's impairments (multi-system involvement);Necessary to address cognition/behavior during functional activity;For patient/therapist safety;To address functional/ADL transfers PT goals addressed during session: Mobility/safety with  mobility;Balance        AM-PAC PT "6 Clicks" Mobility   Outcome Measure  Help needed turning from your back to your side while in a flat bed without using bedrails?: Total Help needed moving from lying on your back to sitting on the side of a flat bed without using bedrails?: Total Help needed moving to and from a bed to a chair (including a wheelchair)?: Total Help needed standing up from a chair using your arms (e.g., wheelchair or bedside chair)?: Total Help needed to walk in hospital room?: Total Help needed climbing 3-5 steps with a railing? : Total 6 Click Score: 6    End of Session Equipment Utilized During Treatment: Gait belt Activity Tolerance: Patient tolerated treatment well Patient left: in chair;with chair alarm set;with call bell/phone within reach;with restraints reapplied Nurse Communication: Mobility status;Need for lift equipment PT Visit Diagnosis: Unsteadiness on feet (R26.81);Hemiplegia and hemiparesis Hemiplegia - Right/Left: Left Hemiplegia - dominant/non-dominant: Non-dominant Hemiplegia - caused by: Nontraumatic intracerebral hemorrhage     Time: 6962-9528 PT Time Calculation (min) (ACUTE ONLY): 42 min  Charges:  $Therapeutic Exercise: 8-22 mins                     Vickki Muff, PT, DPT   Acute Rehabilitation Department Office [phone] Secure Chat Communication Preferred   Ronnie Derby 06/10/2022, 11:14 AM

## 2022-06-10 NOTE — Progress Notes (Signed)
PROGRESS NOTE  Kristin Simmons  DOB: 03-17-1985  PCP: Tylene Fantasia., PA-C ZOX:[id number]  DOA: 05/31/2022  LOS: 10 days  Hospital Day: 11  Brief narrative: Kristin Simmons is a 37 y.o. female with PMH significant for chronic smoking, HTN, cervical cancer, anxiety/depression, and poor compliance to medicines Few months ago, patient had dental pain and had made frequent trips to the ED.  As part of the workup she e underwent maxillofacial CT with contrast which incidentally discovered right carotid aneurysm.  She was referred to neurosurgery.   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  4/16, she was electively admitted by neurosurgery and underwent right pterional craniectomy for clipping of MCA aneurysm.  The procedure was complicated due to intraoperative rupture and need for temporary MCA occlusion. Postprocedure, she stayed on ventilator, moved to ICU and was also started on Cardene drip. 4/18 CT head showed increased anterior and middle division Rt MCA territory edema with hemorrhagic transformation, increased mass effect with 4 mm leftward shift 4/20 extubated 4/23, transferred out to [facility]  Neurosurgery continues to follow.  Subjective: Patient was seen and examined this morning.   Working with physical therapy.  Significantly weak on the left side.  Soft voice, slow to respond but able to have conversation. Remains tachycardic Cortrak tube feeding continues.  Foley catheter draining clear urine Family not at bedside today.  Assessment and plan: Rt MCA aneurysm clipping  Intraoperative aneurysm rupture s/p decompressive craniectomy Postop cerebral edema with hemorrhagic transformation and mass effect Neurosurgery  following Neurological status seems to be waxing and waning with periods of wakefulness, somnolence and restlessness.  However has significant weakness on the left side, significant dysphagia and is getting tube feeding.  Continue to monitor for neurological progress Currently on Keppra 500 mg twice daily.  Hyponatremia Sodium trend as below.  HCTZ/triamterene was stopped.   Repeat lab today continues to show decline in sodium level, 126 today.  Noted neurosurgery has started the patient on salt tablet. If continues to downtrend, may need 3% saline again. Recent Labs  Lab 06/03/22 1944 06/04/22 0225 06/04/22 0743 06/05/22 0726 06/05/22 0929 06/06/22 1926 06/07/22 0833 06/08/22 1107 06/10/22 1126  NA 141 137 136 147* 138 130* 131* 127* 126*    Sinus tachycardia  Essential hypertension Postprocedure, patient had elevated blood pressure and briefly required Cardene drip.  Currently on Coreg 3.125 mg twice daily and nimodipine 60 mg every 4 hours.  Continue to monitor heart rate and blood pressure.  SBP goal <123mmHg  Postop respiratory insufficiency Extubated 4/20.  Currently on room air.  Haemophilus influenza/Enterobacter pneumonia Tracheal aspirate showed haemophilus influenza and Enterobacter.  Currently on cefepime IV, planned for 1 week course to complete on 4/27.  Dysphagia Currently on Cortrak tube feeding.  SLP following.  With the slow neurological recovery she is having, she probably would need PEG tube. Recent Labs  Lab 06/09/22 1920 06/09/22 2307 06/10/22 0306 06/10/22 0845 06/10/22 1206  GLUCAP 138* 139* 155* 139* 131*   Agitation Has not been requiring lately but orders in for PRN Klonopin 0.5 mg twice daily and PRN oxycodone 5mg  TID   Urinary rentention Foley catheter was removed on 4/22 but could not pass voiding trial.  Foley reinserted 4/24.  Mobility: PT to continue.  Goals of  care   Code Status: Full Code     DVT prophylaxis:  heparin  injection 5,000 Units Start: 06/07/22 0915 SCDs Start: 06/02/22 0847 SCD's Start: 05/31/22 1320   Antimicrobials: Currently on IV cefepime Fluid: Ongoing tube feeding Family Communication: Family not at bedside today  Diet:  Diet Order     None       Scheduled Meds:  carvedilol  3.125 mg Per Tube BID WC   Chlorhexidine Gluconate Cloth  6 each Topical Daily   feeding supplement (PROSource TF20)  60 mL Per Tube Daily   fiber supplement (BANATROL TF)  60 mL Per Tube BID   folic acid  1 mg Per Tube Daily   heparin injection (subcutaneous)  5,000 Units Subcutaneous Q8H   insulin aspart  0-15 Units Subcutaneous Q4H   levETIRAcetam  500 mg Per Tube BID   niMODipine  60 mg Oral Q4H   Or   niMODipine  60 mg Per Tube Q4H   mouth rinse  15 mL Mouth Rinse 4 times per day   sodium chloride flush  10-40 mL Intracatheter Q12H   sodium chloride  2 g Oral TID   thiamine  100 mg Per Tube Daily    PRN meds: sodium chloride, acetaminophen **OR** acetaminophen (TYLENOL) oral liquid 160 mg/5 mL **OR** acetaminophen, clonazePAM, hydrALAZINE, mouth rinse, oxyCODONE, sodium chloride flush   Infusions:   sodium chloride 10 mL/hr at 06/10/22 0800   ceFEPime (MAXIPIME) IV Stopped (06/10/22 8119)   feeding supplement (OSMOLITE 1.5 CAL) 45 mL/hr at 06/10/22 0800    Antimicrobials: Anti-infectives (From admission, onward)    Start     Dose/Rate Route Frequency Ordered Stop   06/05/22 1400  ceFEPIme (MAXIPIME) 2 g in sodium chloride 0.9 % 100 mL IVPB        2 g 200 mL/hr over 30 Minutes Intravenous Every 8 hours 06/05/22 1256 06/11/22 2359   06/04/22 0830  Ampicillin-Sulbactam (UNASYN) 3 g in sodium chloride 0.9 % 100 mL IVPB  Status:  Discontinued        3 g 200 mL/hr over 30 Minutes Intravenous Every 6 hours 06/04/22 0733 06/05/22 1256   05/31/22 0611  ceFAZolin (ANCEF) 2-4 GM/100ML-% IVPB       Note to Pharmacy: Amanda Pea M: cabinet override      05/31/22 0611 05/31/22 0841   05/31/22  0600  ceFAZolin (ANCEF) IVPB 2g/100 mL premix        2 g 200 mL/hr over 30 Minutes Intravenous On call to O.R. 05/31/22 0553 05/31/22 0816       Nutritional status:  Body mass index is 21.72 kg/m.  Nutrition Problem: Inadequate oral intake Etiology: inability to eat Signs/Symptoms: NPO status     Objective: Vitals:   06/10/22 1100 06/10/22 1200  BP: (!) 145/80 (!) 133/113  Pulse: (!) 104 (!) 105  Resp: (!) 25 18  Temp:    SpO2: 97% 97%    Intake/Output Summary (Last 24 hours) at 06/10/2022 1310 Last data filed at 06/10/2022 0800 Gross per 24 hour  Intake 1332.17 ml  Output 1375 ml  Net -42.83 ml   Filed Weights   06/07/22 0300 06/08/22 0457 06/09/22 0500  Weight: 52.6 kg 53.6 kg 57.4 kg   Weight change:  Body mass index is 21.72 kg/m.   Physical Exam: General exam: Young African-American female.  Not in pain.  Working with PT.  Very weak. Skin: No rashes, lesions or ulcers. HEENT:  post craniectomy status, core track tube  in place  lungs: Clear to auscultation bilaterally CVS: Regular rate and rhythm, no murmur GI/Abd: soft, nontender, mild distention present.  Bowel sound present. CNS: Awake.  Has left dense hemiplegia.  Soft and slow to respond but able to communicate Psychiatry: Sad affect Extremities: No pedal edema, no calf tenderness  Data Review: I have personally reviewed the laboratory data and studies available.  F/u labs ordered Unresulted Labs (From admission, onward)     Start     Ordered   06/11/22 0500  CBC with Differential/Platelet  Daily,   R     Question:  Specimen collection method  Answer:  Lab=Lab collect   06/10/22 1035   06/11/22 0500  Basic metabolic panel  Daily,   R     Question:  Specimen collection method  Answer:  Lab=Lab collect   06/10/22 1035            Total time spent in review of labs and imaging, patient evaluation, formulation of plan, documentation and communication with family: 45 minutes  Signed, Lorin Glass, MD [facility] 06/10/2022

## 2022-06-10 NOTE — Progress Notes (Addendum)
  NEUROSURGERY PROGRESS NOTE   Pt seen and examined. No issues overnight. Had some drainage from wound yesterday, now dressed.  EXAM: Temp:  [97.9 F (36.6 C)-99.8 F (37.7 C)] 99.8 F (37.7 C) (04/26 0400) Pulse Rate:  [81-117] 105 (04/26 0900) Resp:  [17-31] 24 (04/26 0900) BP: (119-160)/(69-131) 141/105 (04/26 0900) SpO2:  [96 %-100 %] 99 % (04/26 0900) Intake/Output      04/25 0701 04/26 0700 04/26 0701 04/27 0700   I.V. (mL/kg) 112 (2) 75 (1.3)   NG/GT 1080 90   IV Piggyback 309.8 90.2   Total Intake(mL/kg) 1501.8 (26.2) 255.2 (4.4)   Urine (mL/kg/hr) 1375 (1)    Stool 0    Total Output 1375    Net +126.8 +255.2        Stool Occurrence 2 x     Awake, alert, sitting in chair. Pupils 2mm, reactive Answers simple questions, oriented to person, hospital, speaking in sentences, Does appear delirious Following commands RUE/RLE, moves spontaneously and purposefully No voluntary movement LUE/LLE Wound c/d/I, softer today  LABS: Lab Results  Component Value Date   CREATININE 0.60 06/08/2022   BUN 23 (H) 06/08/2022   NA 127 (L) 06/08/2022   K 4.7 06/08/2022   CL 95 (L) 06/08/2022   CO2 21 (L) 06/08/2022   Lab Results  Component Value Date   WBC 14.7 (H) 06/08/2022   HGB 13.8 06/08/2022   HCT 39.9 06/08/2022   MCV 89.3 06/08/2022   PLT 486 (H) 06/08/2022     TCD: Date POD PCO2 HCT BP   MCA ACA PCA OPHT SIPH VERT Basilar  4/17,rs         Right  Left   41  59   -35  -37   24 26 23  16    47  40   -25  -28   -36       4/19 GC         Right  Left   77  73   -35  -45   11  49   19  19   48  20   *  *   *       4/22  [facility]       139/86  Right  Left    118   83    *   -27   *   19    *   *    *   *    -26   -39    -76        4/24  [facility]        142/101 Right  Left    140   76   -22   -44    45   46    26   26    61   30    *   -30    -47        IMPRESSION: - 37 y.o. female POD#10 with intraoperative aneurysm  rupture during opening. Exam appears stable - VAP - Hyponatermia  PLAN: - Cont Keppra - Continue Nimotop - TCD today - Will start salt tabs, Cont to monitor Na. If remains hyponatremic may consider re-starting hypertonic saline. Prefer not to fluid restrict patient with SAH. - Cont PT/OT, If stable clinically, may be ready for CIR early next week. - Cont abx per PCCM   Lisbeth Renshaw, MD [facility]

## 2022-06-10 NOTE — Progress Notes (Signed)
Transcranial Doppler   Date POD PCO2 HCT BP   MCA ACA PCA OPHT SIPH VERT Basilar  4/17,rs         Right  Left   41  59   -35  -37   24 26 23  16    47  40   -25  -28   -36       4/19 GC         Right  Left   77  73   -35  -45   11  49   19  19   48  20   *  *   *       4/22  [facility]       139/86  Right  Left    118   83    *   -27   *   19    *   *    *   *    -26   -39    -76        4/24  [facility]        142/101 Right  Left    140   76   -22   -44    45   46    26   26    61   30    *   -30    -47        4/26  [facility]        136/93 Right  Left    177   57    -50   -43    47   23    22   24     59   45    -18   -25    -45                 Right  Left                                                               Right  Left                                                       MCA = Middle Cerebral Artery      OPHT = Opthalmic Artery     BASILAR = Basilar Artery   ACA = Anterior Cerebral Artery     SIPH = Carotid Siphon PCA = Posterior Cerebral Artery   VERT = Verterbral Artery                    Normal MCA = 62+\-12 ACA = 50+\-12 PCA = 42+\-23    * - Unable to insonate   [place]ard Ratio - Right 8.4  , Left 1.9  06/10/2022 3:57 PM Jean Rosenthal, RVT, RDMS

## 2022-06-10 NOTE — Progress Notes (Signed)
Occupational Therapy Treatment Patient Details Name: Kristin Simmons MRN: [id number] DOB: [birth year] Today's Date: 06/10/2022   History of present illness Pt is a 37yo female who was admitted for elective coiling of R MCA aneurysm however this was complicated by intraoperative rupture resulting in a R pterional craniectomy for clipping of MCA aneurysm. Placement of bone flap in subcutaneous abdominal pocket. PMH: anxiety, anemia, HTN, depression   OT comments  Patient continues to make steady progress towards goals in skilled OT session. [name] session encompassed co-treat with PT, advancing with sit<>stands EOB x3, and increasing overall awareness, activity tolerance, and further assessments of cognition, vision, and ADL management. Patient more morbid in her comments to date, talking about people and children dying, and then stating she was leaving on a trip to [place] tomorrow. PT spending increased time with cervical stretching in supine and EOB, however patient continues to demonstrate significant difficulty in being able to hold her head up against gravity. OT attempting to assess vision stating to patient "follow my finger" to which patient stated "hell no" and held up her finger and told the OT "Follow my finger". OT recommendation remains appropriate, OT will continue to follow.    Recommendations for follow up therapy are one component of a multi-disciplinary discharge planning process, led by the attending physician.  Recommendations may be updated based on patient status, additional functional criteria and insurance authorization.    Assistance Recommended at Discharge Frequent or constant Supervision/Assistance  Patient can return home with the following  Two people to help with walking and/or transfers;Assistance with cooking/housework;Assistance with feeding;Direct supervision/assist for medications management;Direct supervision/assist for financial management;Assist for  transportation;Help with stairs or ramp for entrance;A lot of help with bathing/dressing/bathroom   Equipment Recommendations  Other (comment) (will continue to assess)    Recommendations for Other Services      Precautions / Restrictions Precautions Precautions: Fall Precaution Comments: R craniectomy, no bone flap, bone flap placed in abdomen, R mitt Restrictions Weight Bearing Restrictions: No       Mobility Bed Mobility Overal bed mobility: Needs Assistance Bed Mobility: Sidelying to Sit, Rolling Rolling: Max assist Sidelying to sit: Total assist, +2 for physical assistance       General bed mobility comments: initiating with rolling, but assist for legs off bed and heavy lifting for trunk; to supine in prep for OOB and assist to lower trunk and lift L LE    Transfers Overall transfer level: Needs assistance Equipment used: 2 person hand held assist Transfers: Sit to/from Stand, Bed to chair/wheelchair/BSC Sit to Stand: +2 safety/equipment, Total assist, Max assist, +2 physical assistance Stand pivot transfers: Total assist, +2 physical assistance         General transfer comment: pt completed x3 sit-stand transfers with max-totalA of 2. does attempt with RLE but is unable to maintain trunk or head position without cues/assist.     Balance Overall balance assessment: Needs assistance Sitting-balance support: Feet supported Sitting balance-Leahy Scale: Poor Sitting balance - Comments: mod to max A for sitting balance and A of another for head control   Standing balance support: Bilateral upper extremity supported Standing balance-Leahy Scale: Zero Standing balance comment: +2 for standing                           ADL either performed or assessed with clinical judgement   ADL Overall ADL's : Needs assistance/impaired Eating/Feeding: NPO  General ADL Comments: Session focus on increasing overall  awareness, activity tolerance, and further assessments of cognition, vision, and ADL management.    Extremity/Trunk Assessment Upper Extremity Assessment LUE Deficits / Details: minimal no movement in LUE, trace movement in shoulder            Vision   Vision Assessment?: Vision impaired- to be further tested in functional context Additional Comments: OT attempting to assess vision stating to patient "follow my finger" to which patient stated "hell no" and held up her finger and told the OT "Follow my finger"   Perception     Praxis      Cognition Arousal/Alertness: Lethargic Behavior During Therapy: Restless Overall Cognitive Status: Impaired/Different from baseline Area of Impairment: Attention, Following commands, Safety/judgement, Problem solving, Awareness, Memory                   Current Attention Level: Focused Memory: Decreased short-term memory Following Commands: Follows one step commands with increased time, Follows one step commands consistently, Follows one step commands inconsistently Safety/Judgement: Decreased awareness of safety, Decreased awareness of deficits Awareness: Intellectual Problem Solving: Slow processing, Decreased initiation, Difficulty sequencing, Requires verbal cues, Requires tactile cues General Comments: pt following commands with increased time, talkative but with non-sensical speech involving triplets, vacation plans, and drugs. Pt does show attempts at initiation of movement with cues as strength/deficits allow.        Exercises      Shoulder Instructions       General Comments HR to 120s with standing attempts, other VSS    Pertinent Vitals/ Pain       Pain Assessment Pain Assessment: Faces Faces Pain Scale: Hurts little more Pain Location: neck with rotation to L, head Pain Descriptors / Indicators: Aching, Discomfort Pain Intervention(s): Limited activity within patient's tolerance, Monitored during session,  Repositioned  Home Living                                          Prior Functioning/Environment              Frequency  Min 2X/week        Progress Toward Goals  OT Goals(current goals can now be found in the care plan section)  Progress towards OT goals: Progressing toward goals  Acute Rehab OT Goals Patient Stated Goal: to go on my vacation tomorrow OT Goal Formulation: Patient unable to participate in goal setting Time For Goal Achievement: 06/15/22 Potential to Achieve Goals: Good  Plan Discharge plan remains appropriate    Co-evaluation      Reason for Co-Treatment: Complexity of the patient's impairments (multi-system involvement);Necessary to address cognition/behavior during functional activity;For patient/therapist safety;To address functional/ADL transfers PT goals addressed during session: Mobility/safety with mobility;Balance        AM-PAC OT "6 Clicks" Daily Activity     Outcome Measure   Help from another person eating meals?: Total Help from another person taking care of personal grooming?: A Little Help from another person toileting, which includes using toliet, bedpan, or urinal?: Total Help from another person bathing (including washing, rinsing, drying)?: A Lot Help from another person to put on and taking off regular upper body clothing?: A Lot Help from another person to put on and taking off regular lower body clothing?: Total 6 Click Score: 10    End of Session Equipment Utilized During Treatment: Gait belt  OT Visit Diagnosis: Muscle weakness (generalized) (M62.81);Other abnormalities of gait and mobility (R26.89);Unsteadiness on feet (R26.81);Other symptoms and signs involving cognitive function;Hemiplegia and hemiparesis Hemiplegia - Right/Left: Left Hemiplegia - dominant/non-dominant: Non-Dominant Hemiplegia - caused by: Other cerebrovascular disease   Activity Tolerance Patient tolerated treatment well    Patient Left in chair;with call bell/phone within reach;with chair alarm set   Nurse Communication Mobility status        Time: 1610-9604 OT Time Calculation (min): 40 min  Charges: OT General Charges $OT Visit: 1 Visit OT Treatments $Self Care/Home Management : 23-37 mins  Pollyann Glen E. , OTR/L Acute Rehabilitation Services [phone]   Cherlyn Cushing 06/10/2022, 12:56 PM

## 2022-06-10 NOTE — Progress Notes (Signed)
Speech Language Pathology Treatment: Dysphagia;Cognitive-Linquistic  Patient Details Name: Kristin Simmons MRN: [id number] DOB: Jun 18, 1985 Today's Date: 06/10/2022 Time: 4098-1191 SLP Time Calculation (min) (ACUTE ONLY): 11 min  Assessment / Plan / Recommendation Clinical Impression  Pt was more alert this afternoon, talking excessively with poor attention and confabulations.  With verbal cues, she was able to follow commands to stop talking and sustain attention to swallowing tasks for brief periods of 5-10 seconds. She demonstrated improved oral attention/mastication, improved oral control, and swallowed multiple ice chips with no coughing or wet voice. Sips of water continued to lead to immediate cough.  Recommend allowing occasional ice chips when she is alert/talkative and able to actively chew the chips.  She will likely be ready for instrumental swallow study in the next few days. D/W RN. SLP will follow.   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 She remained on vent post op. ETT 4/16-4/20. Placement of bone flap in subcutaneous abdominal pocket. PMH: anxiety, anemia, HTN, depression.      SLP Plan  Continue with current plan of care      Recommendations for follow up therapy are one component of a multi-disciplinary discharge planning process, led by the attending physician.  Recommendations may be updated based on patient status, additional functional criteria and insurance  authorization.    Recommendations  Diet recommendations: NPO Medication Administration: Via alternative means                  Oral care prior to ice chip/H20;Oral care QID   Frequent or constant Supervision/Assistance Dysphagia, oropharyngeal phase (R13.12)     Continue with current plan of care     L. Samson Frederic, MA CCC/SLP Clinical Specialist - Acute Care SLP Acute Rehabilitation Services Office number [phone]  Blenda Mounts Laurice  06/10/2022, 2:40 PM

## 2022-06-11 DIAGNOSIS — Z9889 Other specified postprocedural states: Secondary | ICD-10-CM | POA: Diagnosis not present

## 2022-06-11 LAB — BASIC METABOLIC PANEL
Anion gap: 10 (ref 5–15)
Anion gap: 7 (ref 5–15)
BUN: 20 mg/dL (ref 6–20)
BUN: 25 mg/dL — ABNORMAL HIGH (ref 6–20)
CO2: 19 mmol/L — ABNORMAL LOW (ref 22–32)
CO2: 21 mmol/L — ABNORMAL LOW (ref 22–32)
Calcium: 9.4 mg/dL (ref 8.9–10.3)
Calcium: 9.5 mg/dL (ref 8.9–10.3)
Chloride: 98 mmol/L (ref 98–111)
Chloride: 104 mmol/L (ref 98–111)
Creatinine, Ser: 0.51 mg/dL (ref 0.44–1.00)
Creatinine, Ser: 0.58 mg/dL (ref 0.44–1.00)
GFR, Estimated: >60 mL/min (ref >60)
GFR, Estimated: >60 mL/min (ref >60)
Glucose, Bld: 109 mg/dL — ABNORMAL HIGH (ref 70–99)
Glucose, Bld: 141 mg/dL — ABNORMAL HIGH (ref 70–99)
Potassium: 5.1 mmol/L (ref 3.5–5.1)
Potassium: 4.7 mmol/L (ref 3.5–5.1)
Sodium: 127 mmol/L — ABNORMAL LOW (ref 135–145)
Sodium: 132 mmol/L — ABNORMAL LOW (ref 135–145)

## 2022-06-11 LAB — CBC WITH DIFFERENTIAL/PLATELET
Abs Immature Granulocytes: 0.12 10*3/uL — ABNORMAL HIGH (ref 0.00–0.07)
Basophils Absolute: 0.1 10*3/uL (ref 0.0–0.1)
Basophils Relative: 1 %
Eosinophils Absolute: 0.1 10*3/uL (ref 0.0–0.5)
Eosinophils Relative: 1 %
HCT: 38.5 % (ref 36.0–46.0)
Hemoglobin: 13.1 g/dL (ref 12.0–15.0)
Immature Granulocytes: 1 %
Lymphocytes Relative: 14 %
Lymphs Abs: 2.3 10*3/uL (ref 0.7–4.0)
MCH: 31 pg (ref 26.0–34.0)
MCHC: 34 g/dL (ref 30.0–36.0)
MCV: 91 fL (ref 80.0–100.0)
Monocytes Absolute: 1.6 10*3/uL — ABNORMAL HIGH (ref 0.1–1.0)
Monocytes Relative: 10 %
Neutro Abs: 11.9 10*3/uL — ABNORMAL HIGH (ref 1.7–7.7)
Neutrophils Relative %: 73 %
Platelets: 608 10*3/uL — ABNORMAL HIGH (ref 150–400)
RBC: 4.23 MIL/uL (ref 3.87–5.11)
RDW: 13.6 % (ref 11.5–15.5)
WBC: 16.2 10*3/uL — ABNORMAL HIGH (ref 4.0–10.5)
nRBC: 0 % (ref 0.0–0.2)

## 2022-06-11 LAB — OSMOLALITY: Osmolality: 284 mOsm/kg (ref 275–295)

## 2022-06-11 LAB — HEPATIC FUNCTION PANEL
ALT: 456 U/L — ABNORMAL HIGH (ref 0–44)
AST: 228 U/L — ABNORMAL HIGH (ref 15–41)
Albumin: 2.7 g/dL — ABNORMAL LOW (ref 3.5–5.0)
Alkaline Phosphatase: 116 U/L (ref 38–126)
Bilirubin, Direct: <0.1 mg/dL (ref 0.0–0.2)
Total Bilirubin: 0.6 mg/dL (ref 0.3–1.2)
Total Protein: 8.5 g/dL — ABNORMAL HIGH (ref 6.5–8.1)

## 2022-06-11 LAB — NA AND K (SODIUM & POTASSIUM), RAND UR
Potassium Urine: 68 mmol/L
Sodium, Ur: 108 mmol/L

## 2022-06-11 LAB — CREATININE, URINE, RANDOM: Creatinine, Urine: 67 mg/dL

## 2022-06-11 LAB — GLUCOSE, CAPILLARY: Glucose-Capillary: 148 mg/dL — ABNORMAL HIGH (ref 70–99)

## 2022-06-11 LAB — OSMOLALITY, URINE: Osmolality, Ur: 959 mOsm/kg — ABNORMAL HIGH (ref 300–900)

## 2022-06-11 LAB — T4, FREE: Free T4: 1.06 ng/dL (ref 0.61–1.12)

## 2022-06-11 LAB — TSH: TSH: 3.26 u[IU]/mL (ref 0.350–4.500)

## 2022-06-11 LAB — MAGNESIUM: Magnesium: 1.9 mg/dL (ref 1.7–2.4)

## 2022-06-11 MED ORDER — BACID PO TABS: 2.0000 | ORAL_TABLET | Freq: Three times a day (TID) | ORAL | Status: DC

## 2022-06-11 MED ORDER — FLORANEX PO PACK
1.0000 g | PACK | Freq: Three times a day (TID) | ORAL | Status: DC
  Administered 2022-06-11 – 2022-06-21 (×31): 1 g
  Filled 2022-06-11 (×34): qty 1

## 2022-06-11 MED ORDER — SIMETHICONE 40 MG/0.6ML PO SUSP
40.0000 mg | Freq: Four times a day (QID) | ORAL | Status: DC
  Administered 2022-06-11 (×2): 40 mg
  Filled 2022-06-11 (×5): qty 0.6

## 2022-06-11 MED ORDER — SACCHAROMYCES BOULARDII 250 MG PO CAPS: 250.0000 mg | ORAL_CAPSULE | Freq: Two times a day (BID) | ORAL | Status: DC

## 2022-06-11 NOTE — Progress Notes (Signed)
Speech Language Pathology Treatment: Dysphagia  Patient Details Name: Kristin Simmons MRN: [id number] DOB: 22-Nov-1985 Today's Date: 06/11/2022 Time: 4098-1191 SLP Time Calculation (min) (ACUTE ONLY): 16 min  Assessment / Plan / Recommendation Clinical Impression  Continued PO trials. Pt alert and communicative with known cognitive deficits. She continues to make progress towards recovery. Pt agreeable for ice chips. Provided diligent oral care. Pt exhibits reduced management of overall saliva with some left sided mild drooling. Left sided oral motor deficits appreciated during PO trials including reduced lingual & labial strength, diminished sensation, reduced bolus cohesion with some left sided bolus loss intermittently. Pt did have some palpable swallows, though appearing delayed. She exhibited delayed throat clear x2 with trace wet vocal quality suggestive of laryngeal vestibule intrusion. Continue PO ice chips when pt alert with oral care. Plans for instrumental swallow study early next week to objectively assess PO readiness.    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 She remained on vent post op. ETT 4/16-4/20. Placement of bone flap in subcutaneous abdominal pocket. PMH: anxiety, anemia, HTN, depression.      SLP Plan  Continue with current plan of care      Recommendations for follow up therapy are one component of a multi-disciplinary discharge planning process, led by the attending  physician.  Recommendations may be updated based on patient status, additional functional criteria and insurance authorization.    Recommendations  Diet recommendations: NPO Medication Administration: Via alternative means                  Oral care prior to ice chip/H20;Oral care QID   Frequent or constant Supervision/Assistance Dysphagia, oropharyngeal phase (R13.12)     Continue with current plan of care     Ardyth Gal MA, CCC-SLP Acute Rehabilitation Services    06/11/2022, 9:44 AM

## 2022-06-11 NOTE — Progress Notes (Signed)
   Providing Compassionate, Quality Care - Together  NEUROSURGERY PROGRESS NOTE   S: No issues overnight.   O: EXAM:  BP 127/69   Pulse 79   Temp 98.7 F (37.1 C) (Axillary)   Resp 17   Ht 5\' 4"  (1.626 m)   Wt 48.8 kg   LMP 04/26/2022 (Approximate) Comment: Urine pregnancy test negative 05/31/22  SpO2 96%   BMI 18.47 kg/m   Eyes open spontaneously, PERRL Answers yes or no questions, oriented to self Dressing in place, clean dry and intact Follows commands in right upper and lower extremities No movement in left upper and lower extremity  ASSESSMENT:  37 y.o. 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  Please do not hesitate to call with questions or concerns.   Monia Pouch, DO Neurosurgeon [facility] Cell: [phone]

## 2022-06-11 NOTE — PMR Pre-admission (Signed)
PMR Admission Coordinator Pre-Admission Assessment  Patient: Kristin Simmons is an 37 y.o., female MRN: [id number] DOB: August 30, 1985 Height: 5\' 4"  (162.6 cm) Weight: 48.8 kg  Insurance Information HMO:     PPO:      PCP:      IPA:      80/20:      OTHER:  PRIMARY: Medicaid Cyl[place] access      Policy#: [id number]      Subscriber: patient CM Name:        Phone#:       Fax#:   Pre-Cert#:        Employer: Not employed Benefits:  Phone #: [phone]     Name: Verified eligibility Eff. Date: Eligible 06/11/22 with coverage code [id number]     Deduct:        Out of Pocket Max:        Life Max:   CIR:        SNF:   Outpatient:       Co-Pay:   Home Health:        Co-Pay:   DME:       Co-Pay:   Providers: in network  SECONDARY:       Policy#:      Phone#:   Artist:       Phone#:   The Data processing manager" for patients in Inpatient Rehabilitation Facilities with attached "Privacy Act Statement-Health Care Records" was provided and verbally reviewed with: N/A  Emergency Contact Information Contact Information     Name Relation Home Work Mobile   Robertson,Felicia Mother [phone]  [phone]       Current Medical History  Patient Admitting Diagnosis: R MCA aneurysm clipping/rupture  History of Present Illness: A 37 yo female smoker with dental pain and 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  She was referred to neurosurgery.  She had coil embolization of Rt ophthalmic aneurysm.  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  Placement of bone flap in subcutaneous abdominal pocket. She remained on vent post op.  Currently NPO with coretrak in place, but speech  doing therapeutic PO trials .  On 4/29, she experienced a change in mental status CT scan showed evidence of severe vasospasm. She was transferred back to the ICU for hemodynamic augmentation, On 4/29, she experienced a change in mental status CT scan showed evidence of severe vasospasm. She was transferred back to the ICU for hemodynamic augmentation. 3% saline discontinued, nimotop discontinued and mental status improve. Pt. Seen by PT/OT/SLP and they recommend CIR to assist return to PLOF.    Complete NIHSS TOTAL: 13  [name] medical record from [facility] has been reviewed by the rehabilitation admission coordinator and physician.  Past Medical History  Past Medical History:  Diagnosis Date   Anemia    Anxiety    Arthritis    Asthma    Cancer (HCC)    cervical   Depression    Headache    Hypertension    She was taking her mother's blood pressure meds but states she doesn't need bp meds now.   Vaginal Pap smear, abnormal     Has the patient had major surgery during 100 days prior to admission?  Yes  Family History   family history includes Alzheimer's disease in her paternal grandmother; Anemia in her mother; Asthma in her daughter, daughter, and daughter; Bronchitis in her daughter, daughter, and daughter; Cancer in her maternal grandfather and maternal grandmother; Diabetes in her sister; Hypertension in her father, mother, and sister; Mental illness in her brother; Thyroid disease in her mother.  Current Medications  Current Facility-Administered Medications:    0.9 %  sodium chloride infusion, , Intravenous, PRN, Lisbeth Renshaw, MD, Last Rate: 10 mL/hr at 06/10/22 0800, Infusion Verify at 06/10/22 0800   acetaminophen (TYLENOL) tablet 650 mg, 650 mg, Oral, Q4H PRN **OR** acetaminophen (TYLENOL) 160 MG/5ML solution 650 mg, 650 mg, Per Tube, Q4H PRN, 650 mg at 06/11/22 1009 **OR** acetaminophen (TYLENOL) suppository 650 mg, 650 mg, Rectal, Q4H PRN, Lisbeth Renshaw, MD   carvedilol (COREG) tablet 3.125 mg, 3.125 mg, Per Tube, BID WC, Dahal, Binaya, MD, 3.125 mg at 06/11/22 1009   ceFEPIme (MAXIPIME) 2 g in sodium chloride 0.9 % 100 mL IVPB, 2 g, Intravenous, Q8H, Dahal, Binaya, MD, Last Rate: 200 mL/hr at 06/11/22 0536, 2 g at 06/11/22 0536   Chlorhexidine Gluconate Cloth 2 % PADS 6 each, 6 each, Topical, Daily, Lisbeth Renshaw, MD, 6 each at 06/10/22 1610   clonazePAM (KLONOPIN) tablet 0.5 mg, 0.5 mg, Per Tube, BID PRN, Melody Comas B, MD, 0.5 mg at 06/11/22 1009   feeding supplement (OSMOLITE 1.5 CAL) liquid 1,000 mL, 1,000 mL, Per Tube, Continuous, Lisbeth Renshaw, MD, Last Rate: 45 mL/hr at 06/10/22 1957, 1,000 mL at 06/10/22 1957   feeding supplement (PROSource TF20) liquid 60 mL, 60 mL, Per Tube, Daily, Craige Cotta, Vineet, MD, 60 mL at 06/11/22 1008   folic acid (FOLVITE) tablet 1 mg, 1 mg, Per Tube, Daily, Sood, Vineet, MD, 1 mg at 06/11/22 1008   heparin injection 5,000 Units, 5,000 Units, Subcutaneous, Q8H, Nundkumar, Marlane Hatcher, MD, 5,000 Units at 06/11/22 0511   hydrALAZINE (APRESOLINE) injection 10-40 mg, 10-40 mg, Intravenous, Q4H PRN, Pia Mau D, PA-C, 20 mg at 06/11/22 0505   levETIRAcetam (KEPPRA) tablet 500 mg, 500 mg, Per Tube, BID, Lisbeth Renshaw, MD, 500 mg at 06/11/22 1017   niMODipine (NIMOTOP) capsule 60 mg, 60 mg, Oral, Q4H, 60 mg at 06/11/22 1008 **OR** niMODipine (NYMALIZE) 6 MG/ML oral solution 60 mg, 60 mg, Per Tube, Q4H, Lisbeth Renshaw, MD, 60 mg at 06/11/22 0542   Oral care mouth rinse, 15 mL, Mouth Rinse, 4 times per day, Lisbeth Renshaw, MD, 15 mL at 06/11/22 0800   Oral care mouth rinse, 15 mL, Mouth Rinse, PRN, Lisbeth Renshaw, MD   oxyCODONE (Oxy IR/ROXICODONE) immediate release tablet 5 mg, 5 mg, Per Tube, Q6H PRN, Martina Sinner, MD, 5 mg at 06/11/22 0542   simethicone (MYLICON) 40 MG/0.6ML suspension 40 mg, 40 mg, Per Tube, QID, Rolly Salter, MD, 40 mg at 06/11/22 1039   sodium chloride  flush (NS) 0.9 % injection 10-40 mL, 10-40 mL, Intracatheter, Q12H, Nundkumar, Marlane Hatcher, MD, 10 mL at 06/11/22 1009   sodium chloride flush (NS) 0.9 % injection 10-40 mL, 10-40 mL, Intracatheter, PRN, Lisbeth Renshaw, MD   sodium chloride tablet 2 g, 2 g, Oral, TID, Lisbeth Renshaw, MD, 2 g at 06/11/22 1008   thiamine (VITAMIN B1) tablet 100 mg, 100 mg, Per Tube, Daily, Coralyn Helling, MD, 100 mg at 06/11/22 1009  Patients Current Diet:  Diet Order     None       Precautions / Restrictions Precautions Precautions: Fall  Precaution Comments: R craniectomy, no bone flap, bone flap placed in abdomen, R mitt Restrictions Weight Bearing Restrictions: No   Has the patient had 2 or more falls or a fall with injury in the past year? No  Prior Activity Level Community (5-7x/wk): Went out daily.  Prior Functional Level Self Care: Did the patient need help bathing, dressing, using the toilet or eating? Independent  Indoor Mobility: Did the patient need assistance with walking from room to room (with or without device)? Independent  Stairs: Did the patient need assistance with internal or external stairs (with or without device)? Independent  Functional Cognition: Did the patient need help planning regular tasks such as shopping or remembering to take medications? Independent  Patient Information Are you of Hispanic, Latino/a,or Spanish origin?: A. No, not of Hispanic, Latino/a, or Spanish origin What is your race?: B. Black or African American Do you need or want an interpreter to communicate with a doctor or health care staff?: 0. No  [name] Response To:  Health Literacy and Transportation Is the patient able to respond to health literacy and transportation needs?: Yes Health Literacy - How often do you need to have someone help you when you read instructions, pamphlets, or other written material from your doctor or pharmacy?: Never In the past 12 months, has lack of  transportation kept you from medical appointments or from getting medications?: No In the past 12 months, has lack of transportation kept you from meetings, work, or from getting things needed for daily living?: No  Home Assistive Devices / Equipment Home Equipment: None  Prior Device Use: Indicate devices/aids used by the patient prior to current illness, exacerbation or injury? None of the above  Current Functional Level Cognition  Arousal/Alertness: Lethargic Overall Cognitive Status: Impaired/Different from baseline Difficult to assess due to: Level of arousal Current Attention Level: Focused Orientation Level: Oriented X4 (answeres orientation questions correctly - yet yells out inapprioarite things. Patient yelling out for her dog to come to her.) Following Commands: Follows one step commands with increased time, Follows one step commands consistently, Follows one step commands inconsistently Safety/Judgement: Decreased awareness of safety, Decreased awareness of deficits General Comments: pt following commands with increased time, talkative but with non-sensical speech involving triplets, vacation plans, and drugs. Pt does show attempts at initiation of movement with cues as strength/deficits allow. Attention: Sustained Sustained Attention: Impaired Sustained Attention Impairment: Functional basic, Verbal basic Awareness: Impaired Awareness Impairment: Intellectual impairment    Extremity Assessment (includes Sensation/Coordination)  Upper Extremity Assessment: LUE deficits/detail, RUE deficits/detail RUE Deficits / Details: Patient able to squeeze hand when prompted intially, then unable to repeat consistently (50% command following) trace activation felt to attempt to open hand but unable, activation noted at shoulder to attempt to hold hand up against gravity (cued with punch upward), but unable, patient then able to independently bring R hand up to head to scratch nose RUE  Sensation: decreased light touch, decreased proprioception RUE Coordination: decreased fine motor, decreased gross motor LUE Deficits / Details: minimal no movement in LUE, trace movement in shoulder  Lower Extremity Assessment: Defer to PT evaluation RLE Deficits / Details: pt wiggled toes and attempted to kick out LE in chair position, pt able to hold LE in hooklying position, + sensation LLE Deficits / Details: appears to have heavier feel, less tone. pt unable to initiate movement reactively or to command LLE Sensation:  (pt did respond to noxious stimuli but moved R UE/LE not L)    ADLs  Overall ADL's : Needs assistance/impaired Eating/Feeding: NPO Grooming: Wash/dry face, Sitting, Oral care, Minimal assistance Grooming Details (indicate cue type and reason): Patient completing teeth brushing at min A, min A to wipe face, though inconsistent and requiring cues with regard to awareness Toilet Transfer: +2 for physical assistance, +2 for safety/equipment, Moderate assistance Toilet Transfer Details (indicate cue type and reason): mod A to complete stand pivot, more for lines and leads and minimal impulsivity Toileting- Clothing Manipulation and Hygiene: Total assistance Toileting - Clothing Manipulation Details (indicate cue type and reason): rectal tube and foley currently General ADL Comments: Session focus on increasing overall awareness, activity tolerance, and further assessments of cognition, vision, and ADL management.    Mobility  Overal bed mobility: Needs Assistance Bed Mobility: Sidelying to Sit, Rolling Rolling: Max assist Sidelying to sit: Total assist, +2 for physical assistance Supine to sit: Max assist, +2 for physical assistance Sit to supine: Max assist, +2 for physical assistance General bed mobility comments: initiating with rolling, but assist for legs off bed and heavy lifting for trunk; to supine in prep for OOB and assist to lower trunk and lift L LE     Transfers  Overall transfer level: Needs assistance Equipment used: 2 person hand held assist Transfers: Sit to/from Stand, Bed to chair/wheelchair/BSC Sit to Stand: +2 safety/equipment, Total assist, Max assist, +2 physical assistance Bed to/from chair/wheelchair/BSC transfer type:: Stand pivot Stand pivot transfers: Total assist, +2 physical assistance Squat pivot transfers: Total assist, +2 physical assistance General transfer comment: pt completed x3 sit-stand transfers with max-totalA of 2. does attempt with RLE but is unable to maintain trunk or head position without cues/assist.    Ambulation / Gait / Stairs / Wheelchair Mobility  Ambulation/Gait General Gait Details: pt unable    Posture / Balance Dynamic Sitting Balance Sitting balance - Comments: mod to max A for sitting balance and A of another for head control Balance Overall balance assessment: Needs assistance Sitting-balance support: Feet supported Sitting balance-Leahy Scale: Poor Sitting balance - Comments: mod to max A for sitting balance and A of another for head control Postural control: Left lateral lean Standing balance support: Bilateral upper extremity supported Standing balance-Leahy Scale: Zero Standing balance comment: +2 for standing    Special needs/care consideration Skin Head post op dressing in place and dressing to right abdomen post op site and Special service needs coretrak, mitten restraints, flexiseal   Previous Home Environment (from acute therapy documentation) Living Arrangements: Children  Lives With: Family Available Help at Discharge: Family, Available 24 hours/day Type of Home: House (townhome) Home Layout: Bed/bath upstairs, Two level Alternate Level Stairs-Number of Steps: 13 Home Access: Level entry Bathroom Shower/Tub: Engineer, manufacturing systems: Standard Additional Comments: no bathroom on ground level, has 5 daughters ages 75 to 4  Discharge Living Setting Plans for  Discharge Living Setting: Apartment, Lives with (comment) (Lives with 5 daughters, 88 yo dtr in the home.) Type of Home at Discharge: Apartment Discharge Home Layout: One level Discharge Home Access: Level entry Discharge Bathroom Shower/Tub: Tub/shower unit, Curtain Discharge Bathroom Toilet: Standard Discharge Bathroom Accessibility: Yes How Accessible: Accessible via walker  Social/Family/Support Systems Patient Roles: Parent, Other (Comment) (Has a mother and 5 daughters.) Contact Information: Mollie Germany - mother Anticipated Caregiver: Mollie Germany - mom - [phone] Ability/Limitations of Caregiver: Mom can stay with patient 24/7 as needed.  Also has 35 yo dtr in the home. Caregiver Availability: 24/7 Discharge Plan Discussed with Primary Caregiver: Yes Is Caregiver In Agreement with  Plan?: Yes Does Caregiver/Family have Issues with Lodging/Transportation while Pt is in Rehab?: No  Goals Patient/Family Goal for Rehab: PT/OT/SLP supervision and min assist goals Expected length of stay: 13-14 days Pt/Family Agrees to Admission and willing to participate: Yes Program Orientation Provided & Reviewed with Pt/Caregiver Including Roles  & Responsibilities: Yes  Decrease burden of Care through IP rehab admission: N/A  Possible need for SNF placement upon discharge: yes, if patient does not progress to level where family can manage at home  Patient Condition: I have reviewed medical records from [facility], spoken with CM, and patient and family member. I met with patient at the bedside and discussed via phone for inpatient rehabilitation assessment.  Patient will benefit from ongoing PT, OT, and SLP, can actively participate in 3 hours of therapy a day 5 days of the week, and can make measurable gains during the admission.  Patient will also benefit from the coordinated team approach during an Inpatient Acute Rehabilitation admission.  The patient will receive  intensive therapy as well as Rehabilitation physician, nursing, social worker, and care management interventions.  Due to bladder management, bowel management, safety, skin/wound care, disease management, medication administration, pain management, and patient education the patient requires 24 hour a day rehabilitation nursing.  The patient is currently max-total A  with mobility and basic ADLs.  Discharge setting and therapy post discharge at home with home health is anticipated.  Patient has agreed to participate in the Acute Inpatient Rehabilitation Program and will admit today.  Preadmission Screen Completed By:  Trish Mage, 06/11/2022 2:18 PM with updates by  Salon, MS, CCC-SLP  ______________________________________________________________________   Discussed status with Dr. Berline Chough  on 06/20/21 at 930 and received approval for admission today.  Admission Coordinator:  Trish Mage, RN, time 1227 Dorna Bloom 06/21/22   Assessment/Plan: Diagnosis: Does the need for close, 24 hr/day Medical supervision in concert with the patient's rehab needs make it unreasonable for this patient to be served in a less intensive setting? Yes Co-Morbidities requiring supervision/potential complications: HTN, depression, dysphagia, s/p R MCA ruptured aneurysm/clipping and SAH; s/p craniectomy-  Cortrak; hypokalemia; Severe hypotension on Florinef Due to bladder management, bowel management, safety, skin/wound care, disease management, medication administration, pain management, and patient education, does the patient require 24 hr/day rehab nursing? Yes Does the patient require coordinated care of a physician, rehab nurse, PT, OT, and SLP to address physical and functional deficits in the context of the above medical diagnosis(es)? Yes Addressing deficits in the following areas: balance, endurance, locomotion, strength, transferring, bowel/bladder control, bathing, dressing, feeding, grooming, toileting,  cognition, speech, language, swallowing, and psychosocial support Can the patient actively participate in an intensive therapy program of at least 3 hrs of therapy 5 days a week? Yes The potential for patient to make measurable gains while on inpatient rehab is good Anticipated functional outcomes upon discharge from inpatient rehab: supervision and min assist PT, supervision and min assist OT, supervision and min assist SLP Estimated rehab length of stay to reach the above functional goals is: 16-20 days Anticipated discharge destination: Home 10. Overall Rehab/Functional Prognosis: good   MD Signature:

## 2022-06-11 NOTE — Progress Notes (Addendum)
[facility] IP rehab admissions - I went by patient room, but patient asleep when I rounded.  I called patient's mother and left a message.  I received a call back from mom.  Patient lives with her 5 daughters, the oldest is 68.  Mom plans to stay with patient at discharge to provide 24/7 care and supervision.  Mom would like [place] to pursue disability application for patient.  I will have my partner follow up tomorrow and Monday.  [phone]

## 2022-06-11 NOTE — Progress Notes (Signed)
[facility] Consultation Progress Note  Patient: Kristin Simmons UJW:[id number]   PCP: Kizzie Furnish D., PA-C DOB: 1985-08-01   DOA: 05/31/2022   DOS: 06/11/2022   Date of Service: the patient was seen and examined on 06/11/2022 Primary service: Lisbeth Renshaw, MD   Brief hospital course: PMH of HTN, smoker, anxiety and depression, cervical cancer who present to the hospital for elective right pterional craniotomy for clipping of MCA aneurysm.  4/16, she was electively admitted by neurosurgery and underwent an right pterional craniectomy for clipping of MCA aneurysm, complex due to intraoperative rupture and need for temporary MCA occlusion. Postprocedure, she stayed on ventilator, moved to ICU and was also started on Cardene drip. Patient was seen by [facility] service as a consult for medical management. 4/18 CT head showed increased anterior and middle division Rt MCA territory edema with hemorrhagic transformation, increased mass effect with 4 mm leftward shift 4/20 extubated 4/23 [facility] was involved in care as a consult for medical management.  Assessment and Plan: Hyponatremia Appears euvolemic. Sodium went as low as 126.  Currently 127. Patient was on HCTZ triamterene which is currently stopped. Osmolarity 284. Urine studies ordered to understand further. Currently on sodium tablets per neurosurgery. If continues to downtrend, may need 3% saline again. Per neurosurgery do not initiate fluid restriction for the patient with an SAH.   Sinus tachycardia  Accelerated hypertension. Postprocedure, patient had elevated blood pressure and briefly required Cardene drip.  Currently on Coreg 3.125 mg twice daily and nimodipine 60 mg every 4 hours.  Continue to monitor heart rate and blood pressure.  SBP goal <140mmHg. TSH and free T4 were checked which appears to be normal.   Echocardiogram ordered. If tachycardia remains elevated will increase Coreg dose   Haemophilus  influenza/Enterobacter pneumonia Tracheal aspirate showed haemophilus influenza and Enterobacter.  Currently on cefepime IV, planned for 1 week course to complete on 4/27.  Postop respiratory insufficiency Extubated 4/20.  Currently on room air.   Dysphagia Currently on Cortrak tube feeding.  SLP following.  With the slow neurological recovery she is having, she probably would need PEG tube.  LFT elevation. LFT check on 4/27 shows AST of 228 and ALT of 456. Bilirubin normal. Prior value in 4/21 were 44 and 37 respectively. Suspect this is secondary to cefepime. Patient completing antibiotic already on 4/27. Will recheck the value if remains elevated will initiate further workup.  Rt MCA aneurysm clipping  Intraoperative aneurysm rupture s/p decompressive craniectomy Postop cerebral edema with hemorrhagic transformation and mass effect Neurosurgery following Neurological status seems to be waxing and waning with periods of wakefulness, somnolence and restlessness.  However has significant weakness on the left side, significant dysphagia and is getting tube feeding.  Continue to monitor for neurological progress Currently on Keppra 500 mg twice daily.  Agitation Has not been requiring lately but orders in for PRN Klonopin 0.5 mg twice daily and PRN oxycodone 5mg  TID   Urinary rentention Foley catheter was removed on 4/22 but could not pass voiding trial.  Foley reinserted 4/24.   We will continue to follow the patient.    Subjective: Denies any acute complaint.  No nausea no vomiting.  No acute events overnight.  Remains tachycardic.  Objective: Vitals:   06/11/22 1200 06/11/22 1300 06/11/22 1400 06/11/22 1500  BP: 105/68 (!) 130/90 137/89 (!) 150/98  Pulse: 96 (!) 113 (!) 108 (!) 111  Resp: 16 17 13  (!) 25  Temp: 99 F (37.2 C)  TempSrc: Axillary     SpO2: 97% 98% 98% 98%  Weight:      Height:      Clear to auscultation. S1-S2 present but tachycardic. Bowel sound  present. Left-sided weakness.  Able to tell me her name.  Family Communication: No one at bedside  Data Reviewed: Since last encounter, pertinent lab results CBC and BMP   . I have ordered test including CBC and CMP TSH and free T4 echocardiogram  .   Author: Lynden Oxford, MD 06/11/2022 4:23 PM To reach On-call, see care teams to locate the attending and reach out to them via [email or web address]. If 7PM-7AM, please contact night-coverage If you still have difficulty reaching the attending provider, please page the [facility] (Director on Call) for [facility] on amion for assistance.

## 2022-06-11 NOTE — Hospital Course (Signed)
PMH of HTN, smoker, anxiety and depression, cervical cancer who present to the hospital for elective right pterional craniotomy for clipping of MCA aneurysm.  4/16, she was electively admitted by neurosurgery and underwent an right pterional craniectomy for clipping of MCA aneurysm, complex due to intraoperative rupture and need for temporary MCA occlusion. Postprocedure, she stayed on ventilator, moved to ICU and was also started on Cardene drip. Patient was seen by [facility] service as a consult for medical management. 4/18 CT head showed increased anterior and middle division Rt MCA territory edema with hemorrhagic transformation, increased mass effect with 4 mm leftward shift 4/20 extubated 4/23 [facility] was involved in care as a consult for medical management. 4/28 echocardiogram 7075% EF, grade 1 diastolic dysfunction, mild LVH

## 2022-06-11 NOTE — Progress Notes (Signed)
During bed change patients incision to forehead began draining grey, bloody thick drainage ( quarter size amount). Note created by Butch Penny on 4/25 states MD is aware and to keep dressing applied at all times. Dressing reapplied to patients forehead.

## 2022-06-12 ENCOUNTER — Inpatient Hospital Stay (HOSPITAL_COMMUNITY): Payer: Medicaid Other

## 2022-06-12 DIAGNOSIS — Z9889 Other specified postprocedural states: Secondary | ICD-10-CM | POA: Diagnosis not present

## 2022-06-12 DIAGNOSIS — I6389 Other cerebral infarction: Secondary | ICD-10-CM

## 2022-06-12 LAB — CBC WITH DIFFERENTIAL/PLATELET
Abs Immature Granulocytes: 0.06 10*3/uL (ref 0.00–0.07)
Basophils Absolute: 0.1 10*3/uL (ref 0.0–0.1)
Basophils Relative: 0 %
Eosinophils Absolute: 0.1 10*3/uL (ref 0.0–0.5)
Eosinophils Relative: 1 %
HCT: 37 % (ref 36.0–46.0)
Hemoglobin: 12.7 g/dL (ref 12.0–15.0)
Immature Granulocytes: 0 %
Lymphocytes Relative: 13 %
Lymphs Abs: 1.8 10*3/uL (ref 0.7–4.0)
MCH: 31.4 pg (ref 26.0–34.0)
MCHC: 34.3 g/dL (ref 30.0–36.0)
MCV: 91.4 fL (ref 80.0–100.0)
Monocytes Absolute: 1.1 10*3/uL — ABNORMAL HIGH (ref 0.1–1.0)
Monocytes Relative: 8 %
Neutro Abs: 10.6 10*3/uL — ABNORMAL HIGH (ref 1.7–7.7)
Neutrophils Relative %: 78 %
Platelets: 654 10*3/uL — ABNORMAL HIGH (ref 150–400)
RBC: 4.05 MIL/uL (ref 3.87–5.11)
RDW: 13.7 % (ref 11.5–15.5)
WBC: 13.8 10*3/uL — ABNORMAL HIGH (ref 4.0–10.5)
nRBC: 0 % (ref 0.0–0.2)

## 2022-06-12 LAB — BASIC METABOLIC PANEL
Anion gap: 7 (ref 5–15)
BUN: 22 mg/dL — ABNORMAL HIGH (ref 6–20)
CO2: 23 mmol/L (ref 22–32)
Calcium: 9.6 mg/dL (ref 8.9–10.3)
Chloride: 102 mmol/L (ref 98–111)
Creatinine, Ser: 0.62 mg/dL (ref 0.44–1.00)
GFR, Estimated: >60 mL/min (ref >60)
Glucose, Bld: 126 mg/dL — ABNORMAL HIGH (ref 70–99)
Potassium: 4.3 mmol/L (ref 3.5–5.1)
Sodium: 132 mmol/L — ABNORMAL LOW (ref 135–145)

## 2022-06-12 LAB — HEPATIC FUNCTION PANEL
ALT: 345 U/L — ABNORMAL HIGH (ref 0–44)
AST: 152 U/L — ABNORMAL HIGH (ref 15–41)
Albumin: 2.6 g/dL — ABNORMAL LOW (ref 3.5–5.0)
Alkaline Phosphatase: 106 U/L (ref 38–126)
Bilirubin, Direct: <0.1 mg/dL (ref 0.0–0.2)
Total Bilirubin: 0.5 mg/dL (ref 0.3–1.2)
Total Protein: 8.1 g/dL (ref 6.5–8.1)

## 2022-06-12 LAB — ECHOCARDIOGRAM COMPLETE
Area-P 1/2: 3.81 cm2
Height: 64 in
S' Lateral: 1.67 cm
Weight: 1721.35 oz

## 2022-06-12 MED ORDER — BETHANECHOL CHLORIDE 10 MG PO TABS
5.0000 mg | ORAL_TABLET | Freq: Three times a day (TID) | ORAL | Status: DC
  Administered 2022-06-12 – 2022-06-21 (×28): 5 mg
  Filled 2022-06-12 (×28): qty 1

## 2022-06-12 MED ORDER — SODIUM CHLORIDE 1 G PO TABS
1.0000 g | ORAL_TABLET | Freq: Three times a day (TID) | ORAL | Status: DC
  Administered 2022-06-12 – 2022-06-13 (×2): 1 g via ORAL
  Filled 2022-06-12 (×2): qty 1

## 2022-06-12 NOTE — Plan of Care (Signed)
Progressing toward goals. 

## 2022-06-12 NOTE — Progress Notes (Signed)
[facility] Consultation Progress Note  Patient: Kristin Simmons ZOX:[id number]   PCP: Kizzie Furnish D., PA-C DOB: Feb 16, 1985   DOA: 05/31/2022   DOS: 06/12/2022   Date of Service: the patient was seen and examined on 06/12/2022 Primary service: Lisbeth Renshaw, MD   Brief hospital course: PMH of HTN, smoker, anxiety and depression, cervical cancer who present to the hospital for elective right pterional craniotomy for clipping of MCA aneurysm.  4/16, she was electively admitted by neurosurgery and underwent an right pterional craniectomy for clipping of MCA aneurysm, complex due to intraoperative rupture and need for temporary MCA occlusion. Postprocedure, she stayed on ventilator, moved to ICU and was also started on Cardene drip. Patient was seen by [facility] service as a consult for medical management. 4/18 CT head showed increased anterior and middle division Rt MCA territory edema with hemorrhagic transformation, increased mass effect with 4 mm leftward shift 4/20 extubated 4/23 [facility] was involved in care as a consult for medical management. 4/28 echocardiogram 7075% EF, grade 1 diastolic dysfunction, mild LVH   Assessment and Plan: Hyponatremia Appears euvolemic. Sodium went as low as 126.  Currently improved to 132. Patient was on HCTZ triamterene which is currently stopped. Osmolarity 284. Urine studies ordered to understand further. Currently on sodium tablets per neurosurgery.  Will reduce the dose from 2G-1G.  If continues to downtrend, may need 3% saline again. Per neurosurgery do not initiate fluid restriction for the patient with an SAH.   Sinus tachycardia  Accelerated hypertension. Postprocedure, patient had elevated blood pressure and briefly required Cardene drip.  Currently on Coreg 3.125 mg twice daily and nimodipine 60 mg every 4 hours.  Continue to monitor heart rate and blood pressure.  SBP goal <125mmHg. TSH and free T4 were checked which appears to be  normal.   Echocardiogram shows hyperdynamic circulation. If tachycardia remains an issue will increase Coreg dose   Haemophilus influenza/Enterobacter pneumonia Tracheal aspirate showed haemophilus influenza and Enterobacter.  Currently on cefepime IV, planned for 1 week course to complete on 4/27.   Postop respiratory insufficiency Extubated 4/20.  Currently on room air.   Dysphagia Currently on Cortrak tube feeding.  SLP following.  With the slow neurological recovery she is having, she probably would need PEG tube.   LFT elevation. LFT check on 4/27 shows AST of 228 and ALT of 456. Bilirubin normal. Prior value in 4/21 were 44 and 37 respectively. Suspect this is secondary to cefepime. Patient completing antibiotic already on 4/27. Improving as of 4/28.  Monitor.   Rt MCA aneurysm clipping  Intraoperative aneurysm rupture s/p decompressive craniectomy Postop cerebral edema with hemorrhagic transformation and mass effect Neurosurgery following Neurological status seems to be waxing and waning with periods of wakefulness, somnolence and restlessness.  However has significant weakness on the left side, significant dysphagia and is getting tube feeding.  Continue to monitor for neurological progress Currently on Keppra 500 mg twice daily.   Agitation Has not been requiring lately but orders in for PRN Klonopin 0.5 mg twice daily and PRN oxycodone 5mg  TID   Urinary rentention Foley catheter was removed on 4/22 but could not pass voiding trial.  Foley reinserted 4/24.   Initiate bethanechol.  For concerns for neurogenic bladder.  We will continue to follow the patient.   Subjective: No nausea no vomiting no fever no chills diarrhea improving.  Has a Flexi-Seal now.  Objective: Vitals:   06/12/22 0600 06/12/22 0800 06/12/22 1200 06/12/22 1600  BP: (!) 149/93  Pulse: (!) 116     Resp: (!) 23     Temp:  98.6 F (37 C) 98.3 F (36.8 C) 100 F (37.8 C)  TempSrc:  Axillary  Axillary Axillary  SpO2: 99%     Weight:      Height:        General: in Mild distress, No Rash Cardiovascular: S1 and S2 Present, No Murmur Respiratory: Good respiratory effort, Bilateral Air entry present. No Crackles, No wheezes Abdomen: Bowel Sound present, No tenderness Extremities: No edema Neuro: Alert and oriented x3, no new focal deficit, left-sided weakness.  Family Communication: No one at bedside  Data Reviewed: Since last encounter, pertinent lab results CBC and BMP   . I have ordered test including CBC and BMP  .   Author: Lynden Oxford, MD 06/12/2022 5:10 PM  To reach On-call, see care teams to locate the attending and reach out to them via [email or web address]. If 7PM-7AM, please contact night-coverage If you still have difficulty reaching the attending provider, please page the [facility] (Director on Call) for [facility] on amion for assistance.

## 2022-06-12 NOTE — Progress Notes (Signed)
   Providing Compassionate, Quality Care - Together  NEUROSURGERY PROGRESS NOTE   S: No issues overnight.   O: EXAM:  BP (!) 149/93   Pulse (!) 116   Temp 98.9 F (37.2 C) (Oral)   Resp (!) 23   Ht 5\' 4"  (1.626 m)   Wt 48.8 kg   LMP 04/26/2022 (Approximate) Comment: Urine pregnancy test negative 05/31/22  SpO2 99%   BMI 18.47 kg/m   Eyes open spontaneously PERRL Answers yes or no questions, oriented to self, some tangential thoughts Dressing in place, clean dry and intact Follows commands in right upper and lower extremities No movement in left upper and lower extremity   ASSESSMENT:  37 y.o. female with    POD# 12 right MCA aneurysm clipping complicated by intraoperative rupture   PLAN: -Continue ICU care -Sodium improving   Thank you for allowing me to participate in this patient's care.  Please do not hesitate to call with questions or concerns.   Monia Pouch, DO Neurosurgeon [facility] Cell: [phone]

## 2022-06-12 NOTE — Progress Notes (Signed)
  Echocardiogram 2D Echocardiogram has been performed.   Wynn Banker 06/12/2022, 12:30 PM

## 2022-06-13 ENCOUNTER — Inpatient Hospital Stay (HOSPITAL_COMMUNITY): Payer: Medicaid Other

## 2022-06-13 DIAGNOSIS — Z9889 Other specified postprocedural states: Secondary | ICD-10-CM | POA: Diagnosis not present

## 2022-06-13 DIAGNOSIS — I67848 Other cerebrovascular vasospasm and vasoconstriction: Secondary | ICD-10-CM

## 2022-06-13 DIAGNOSIS — I609 Nontraumatic subarachnoid hemorrhage, unspecified: Secondary | ICD-10-CM | POA: Diagnosis not present

## 2022-06-13 LAB — CBC WITH DIFFERENTIAL/PLATELET
Abs Immature Granulocytes: 0.05 10*3/uL (ref 0.00–0.07)
Basophils Absolute: 0.1 10*3/uL (ref 0.0–0.1)
Basophils Relative: 1 %
Eosinophils Absolute: 0.1 10*3/uL (ref 0.0–0.5)
Eosinophils Relative: 1 %
HCT: 35.3 % — ABNORMAL LOW (ref 36.0–46.0)
Hemoglobin: 12.3 g/dL (ref 12.0–15.0)
Immature Granulocytes: 0 %
Lymphocytes Relative: 14 %
Lymphs Abs: 2 10*3/uL (ref 0.7–4.0)
MCH: 31.3 pg (ref 26.0–34.0)
MCHC: 34.8 g/dL (ref 30.0–36.0)
MCV: 89.8 fL (ref 80.0–100.0)
Monocytes Absolute: 1.4 10*3/uL — ABNORMAL HIGH (ref 0.1–1.0)
Monocytes Relative: 10 %
Neutro Abs: 10.5 10*3/uL — ABNORMAL HIGH (ref 1.7–7.7)
Neutrophils Relative %: 74 %
Platelets: 751 10*3/uL — ABNORMAL HIGH (ref 150–400)
RBC: 3.93 MIL/uL (ref 3.87–5.11)
RDW: 13.9 % (ref 11.5–15.5)
WBC: 14.3 10*3/uL — ABNORMAL HIGH (ref 4.0–10.5)
nRBC: 0 % (ref 0.0–0.2)

## 2022-06-13 LAB — COMPREHENSIVE METABOLIC PANEL
ALT: 368 U/L — ABNORMAL HIGH (ref 0–44)
AST: 157 U/L — ABNORMAL HIGH (ref 15–41)
Albumin: 2.8 g/dL — ABNORMAL LOW (ref 3.5–5.0)
Alkaline Phosphatase: 107 U/L (ref 38–126)
Anion gap: 9 (ref 5–15)
BUN: 21 mg/dL — ABNORMAL HIGH (ref 6–20)
CO2: 23 mmol/L (ref 22–32)
Calcium: 9.9 mg/dL (ref 8.9–10.3)
Chloride: 101 mmol/L (ref 98–111)
Creatinine, Ser: 0.58 mg/dL (ref 0.44–1.00)
GFR, Estimated: >60 mL/min (ref >60)
Glucose, Bld: 100 mg/dL — ABNORMAL HIGH (ref 70–99)
Potassium: 4.5 mmol/L (ref 3.5–5.1)
Sodium: 133 mmol/L — ABNORMAL LOW (ref 135–145)
Total Bilirubin: 0.5 mg/dL (ref 0.3–1.2)
Total Protein: 8.7 g/dL — ABNORMAL HIGH (ref 6.5–8.1)

## 2022-06-13 LAB — MAGNESIUM: Magnesium: 2 mg/dL (ref 1.7–2.4)

## 2022-06-13 MED ORDER — NOREPINEPHRINE 4 MG/250ML-% IV SOLN
2.0000 ug/min | INTRAVENOUS | Status: DC
  Administered 2022-06-13: 2 ug/min via INTRAVENOUS
  Filled 2022-06-13: qty 250

## 2022-06-13 MED ORDER — IOHEXOL 350 MG/ML SOLN
75.0000 mL | Freq: Once | INTRAVENOUS | Status: AC | PRN
  Administered 2022-06-13: 75 mL via INTRAVENOUS

## 2022-06-13 MED ORDER — BANATROL TF EN LIQD
60.0000 mL | Freq: Two times a day (BID) | ENTERAL | Status: DC
  Administered 2022-06-13 – 2022-06-21 (×17): 60 mL
  Filled 2022-06-13 (×17): qty 60

## 2022-06-13 MED ORDER — SODIUM CHLORIDE 0.9 % IV BOLUS
500.0000 mL | Freq: Once | INTRAVENOUS | Status: AC
  Administered 2022-06-13: 500 mL via INTRAVENOUS

## 2022-06-13 MED ORDER — PHENYLEPHRINE HCL-NACL 20-0.9 MG/250ML-% IV SOLN
0.0000 ug/min | INTRAVENOUS | Status: DC
  Administered 2022-06-13: 20 ug/min via INTRAVENOUS
  Administered 2022-06-14: 100 ug/min via INTRAVENOUS
  Administered 2022-06-14: 40 ug/min via INTRAVENOUS
  Administered 2022-06-14 – 2022-06-15 (×2): 80 ug/min via INTRAVENOUS
  Administered 2022-06-15: 170 ug/min via INTRAVENOUS
  Administered 2022-06-15: 180 ug/min via INTRAVENOUS
  Filled 2022-06-13 (×8): qty 250

## 2022-06-13 MED ORDER — SODIUM CHLORIDE 0.9 % IV BOLUS
500.0000 mL | INTRAVENOUS | Status: DC | PRN
  Administered 2022-06-15: 500 mL via INTRAVENOUS

## 2022-06-13 MED ORDER — ALBUMIN HUMAN 5 % IV SOLN
25.0000 g | Freq: Four times a day (QID) | INTRAVENOUS | Status: AC
  Administered 2022-06-13 – 2022-06-14 (×4): 25 g via INTRAVENOUS
  Filled 2022-06-13 (×3): qty 500

## 2022-06-13 MED ORDER — NOREPINEPHRINE 4 MG/250ML-% IV SOLN
0.0000 ug/min | INTRAVENOUS | Status: DC
  Administered 2022-06-13: 40 ug/min via INTRAVENOUS
  Administered 2022-06-13: 42 ug/min via INTRAVENOUS
  Administered 2022-06-14: 48 ug/min via INTRAVENOUS
  Administered 2022-06-14: 44 ug/min via INTRAVENOUS
  Administered 2022-06-14: 46 ug/min via INTRAVENOUS
  Administered 2022-06-14 (×2): 48 ug/min via INTRAVENOUS
  Filled 2022-06-13: qty 500
  Filled 2022-06-13: qty 250
  Filled 2022-06-13 (×2): qty 500

## 2022-06-13 MED ORDER — CARVEDILOL 3.125 MG PO TABS
6.2500 mg | ORAL_TABLET | Freq: Two times a day (BID) | ORAL | Status: DC
  Administered 2022-06-13 – 2022-06-14 (×2): 6.25 mg
  Filled 2022-06-13 (×2): qty 2

## 2022-06-13 MED ORDER — GERHARDT'S BUTT CREAM
TOPICAL_CREAM | Freq: Every day | CUTANEOUS | Status: DC | PRN
  Filled 2022-06-13: qty 1

## 2022-06-13 MED ORDER — SODIUM CHLORIDE 0.9 % IV SOLN: INTRAVENOUS | Status: DC

## 2022-06-13 MED ORDER — SODIUM CHLORIDE 0.9 % IV SOLN: 250.0000 mL | INTRAVENOUS | Status: DC

## 2022-06-13 MED ORDER — SODIUM CHLORIDE 1 G PO TABS
1.0000 g | ORAL_TABLET | Freq: Three times a day (TID) | ORAL | Status: DC
  Administered 2022-06-13 – 2022-06-14 (×3): 1 g
  Filled 2022-06-13 (×3): qty 1

## 2022-06-13 NOTE — Progress Notes (Signed)
  NEUROSURGERY PROGRESS NOTE   Pt seen and examined. No issues overnight.  EXAM: Temp:  [97.9 F (36.6 C)-99.8 F (37.7 C)] 99.8 F (37.7 C) (04/26 0400) Pulse Rate:  [81-117] 105 (04/26 0900) Resp:  [17-31] 24 (04/26 0900) BP: (119-160)/(69-131) 141/105 (04/26 0900) SpO2:  [96 %-100 %] 99 % (04/26 0900) Intake/Output      04/25 0701 04/26 0700 04/26 0701 04/27 0700   I.V. (mL/kg) 112 (2) 75 (1.3)   NG/GT 1080 90   IV Piggyback 309.8 90.2   Total Intake(mL/kg) 1501.8 (26.2) 255.2 (4.4)   Urine (mL/kg/hr) 1375 (1)    Stool 0    Total Output 1375    Net +126.8 +255.2        Stool Occurrence 2 x     Awake, alert, confused Pupils 2mm, reactive Answers simple questions Following commands RUE/RLE, moves spontaneously and purposefully No voluntary movement LUE/LLE Wound c/d/I, softer today  LABS: Lab Results  Component Value Date   CREATININE 0.60 06/08/2022   BUN 23 (H) 06/08/2022   NA 127 (L) 06/08/2022   K 4.7 06/08/2022   CL 95 (L) 06/08/2022   CO2 21 (L) 06/08/2022   Lab Results  Component Value Date   WBC 14.7 (H) 06/08/2022   HGB 13.8 06/08/2022   HCT 39.9 06/08/2022   MCV 89.3 06/08/2022   PLT 486 (H) 06/08/2022     TCD: Date POD PCO2 HCT BP   MCA ACA PCA OPHT SIPH VERT Basilar  4/17,rs         Right  Left   41  59   -35  -37   24 26 23  16    47  40   -25  -28   -36       4/19 GC         Right  Left   77  73   -35  -45   11  49   19  19   48  20   *  *   *       4/22  [facility]       139/86  Right  Left    118   83    *   -27   *   19    *   *    *   *    -26   -39    -76        4/24  [facility]        142/101 Right  Left    140   76   -22   -44    45   46    26   26    61   30    *   -30    -47        IMPRESSION: - 37 y.o. female POD#113 with intraoperative aneurysm rupture during opening. Exam appears stable - VAP - Hyponatermia  PLAN: - Cont Keppra - Continue Nimotop - TCD today, will order f/u  CTA also - Cont to monitor serum Na - Trend leukocytosis. If increased tomorrow may consider re-pan-culture   Lisbeth Renshaw, MD [facility]

## 2022-06-13 NOTE — Progress Notes (Signed)
Pt's CTA results were called to bedside RN.  Pt is having severe vasospasm.  This RN notified Dr. Conchita Paris of this.  He asked CCM to re-consult due to needing to increase SBP for triple H therapy.  RN called CCM.  They are placing orders for vasopressor in peripheral at this time.  Pt may potentially need a central line if there is need to increase the pressor higher to be within goal range. (>180 SBP).

## 2022-06-13 NOTE — Procedures (Signed)
Central Venous Catheter Insertion Procedure Note  CLARECE DRZEWIECKI  [id number]  25-Aug-1985  Date:06/13/22  Time:7:12 PM   Provider Performing:    Procedure: Insertion of Non-tunneled Central Venous Catheter(36556) with US guidance (56213)   Indication(s) Difficult access  Consent Unable to obtain consent due to emergent nature of procedure.  Anesthesia Topical only with 1% lidocaine   Timeout Verified patient identification, verified procedure, site/side was marked, verified correct patient position, special equipment/implants available, medications/allergies/relevant history reviewed, required imaging and test results available.  Sterile Technique Maximal sterile technique including full sterile barrier drape, hand hygiene, sterile gown, sterile gloves, mask, hair covering, sterile ultrasound probe cover (if used).  Procedure Description Area of catheter insertion was cleaned with chlorhexidine and draped in sterile fashion.  With real-time ultrasound guidance a central venous catheter was placed into the left internal jugular vein. Nonpulsatile blood flow and easy flushing noted in all ports.  The catheter was sutured in place and sterile dressing applied.  Difficult cannulation.  Unable to cannulate subclavian vein.  IJ cannulation difficult due to vein collapse.  Complications/Tolerance None; patient tolerated the procedure well. Chest X-ray is ordered to verify placement for internal jugular or subclavian cannulation.   Chest x-ray is not ordered for femoral cannulation.  EBL Minimal  Specimen(s) None   Lynnell Catalan, MD [facility] ICU Physician [facility]  Pager: [phone] Or Epic Secure Chat After hours: [phone].  06/13/2022, 7:13 PM

## 2022-06-13 NOTE — Progress Notes (Signed)
eLink Physician-Brief Progress Note Patient Name: Kristin Simmons DOB: 1986/01/23 MRN: [id number]   Date of Service  06/13/2022  HPI/Events of Note  Pt had been on peripheral IV dose levophed.  IJ catheter in place  eICU Interventions  Changed order for levophed - may use central line         M DELA CRUZ 06/13/2022, 8:37 PM

## 2022-06-13 NOTE — Progress Notes (Signed)
[facility] Consultation Progress Note  Patient: Kristin Simmons ZOX:[id number]   PCP: Kizzie Furnish D., PA-C DOB: [birth year]   DOA: 05/31/2022   DOS: 06/13/2022   Date of Service: the patient was seen and examined on 06/13/2022 Primary service: Lisbeth Renshaw, MD   Brief hospital course: PMH of HTN, smoker, anxiety and depression, cervical cancer who present to the hospital for elective right pterional craniotomy for clipping of MCA aneurysm.  4/16, she was electively admitted by neurosurgery and underwent an right pterional craniectomy for clipping of MCA aneurysm, complex due to intraoperative rupture and need for temporary MCA occlusion. Postprocedure, she stayed on ventilator, moved to ICU and was also started on Cardene drip. Patient was seen by [facility] service as a consult for medical management. 4/18 CT head showed increased anterior and middle division Rt MCA territory edema with hemorrhagic transformation, increased mass effect with 4 mm leftward shift 4/20 extubated 4/23 TRH was involved in care as a consult for medical management. 4/28 echocardiogram 7075% EF, grade 1 diastolic dysfunction, mild LVH  4/29 patient started level continue to be stable even after reducing the dose of sodium tablet.  Assessment and Plan: Hyponatremia Appears euvolemic. Sodium went as low as 126.  Currently improved to 132. Patient was on HCTZ triamterene which is currently stopped. Osmolarity 284. Urine studies ordered to understand further. Currently on sodium tablets per neurosurgery.  Will reduce the dose from 2G-1G. After that sodium levels continue to be stable at 133.   Per neurosurgery do not initiate fluid restriction for the patient with an SAH.   Sinus tachycardia  Accelerated hypertension. Postprocedure, patient had elevated blood pressure and briefly required Cardene drip.  Currently on Coreg 3.125 mg twice daily and nimodipine 60 mg every 4 hours.  Increase the dose of Coreg  to 6.25 due to  tachycardia Continue to monitor heart rate and blood pressure.  SBP goal <161mmHg. TSH and free T4 were checked which appears to be normal.   Echocardiogram shows hyperdynamic circulation.    Haemophilus influenza/Enterobacter pneumonia Tracheal aspirate showed haemophilus influenza and Enterobacter.  Currently on cefepime IV, planned for 1 week course to complete on 4/27.   Postop respiratory insufficiency Extubated 4/20.  Currently on room air.   Dysphagia Currently on Cortrak tube feeding.  SLP following.  With the slow neurological recovery she is having, she probably would need PEG tube.   LFT elevation. LFT check on 4/27 shows AST of 228 and ALT of 456. Bilirubin normal. Prior value in 4/21 were 44 and 37 respectively. Suspect this is secondary to cefepime. Patient completing antibiotic already on 4/27. Improving as of 4/28.  Monitor.   Rt MCA aneurysm clipping  Intraoperative aneurysm rupture s/p decompressive craniectomy Postop cerebral edema with hemorrhagic transformation and mass effect Neurosurgery following Neurological status seems to be waxing and waning with periods of wakefulness, somnolence and restlessness.  However has significant weakness on the left side, significant dysphagia and is getting tube feeding.  Continue to monitor for neurological progress Currently on Keppra 500 mg twice daily. Repeat CT of the head has been ordered to assess the status of the SDH   Agitation Has not been requiring lately but orders in for PRN Klonopin 0.5 mg twice daily and PRN oxycodone 5mg  TID.  Patient appeared to be more or less calm.   Urinary rentention Foley catheter was removed on 4/22 but could not pass voiding trial.  Foley reinserted 4/24.   Initiate bethanechol.  For concerns for neurogenic  bladder.  We will continue to follow the patient.   Subjective: Seen at bedside she was sitting on the chair.  Had no new complaints appeared to be calm.  Spoke  to the patient's mother at bedside.  Objective: Vitals:   06/13/22 1157 06/13/22 1200 06/13/22 1300 06/13/22 1400  BP:    (!) 136/93  Pulse:  (!) 106 (!) 104 (!) 119  Resp:  (!) 26 17 18   Temp: 98.1 F (36.7 C)     TempSrc: Axillary     SpO2:  99% 100% 98%  Weight:      Height:        General: in Mild distress, No Rash Cardiovascular: S1 and S2 Present, No Murmur Respiratory: Good respiratory effort, Bilateral Air entry present. No Crackles, No wheezes Abdomen: Bowel Sound present, No tenderness Extremities: No edema Neuro: Alert and oriented x3, no new focal deficit, left-sided weakness.  Family Communication: No one at bedside  Data Reviewed: Since last encounter, pertinent lab results CBC and BMP   . I have ordered test including CBC and BMP  .   Author: Lynden Oxford, MD 06/13/2022 3:03 PM  To reach On-call, see care teams to locate the attending and reach out to them via [email or web address]. If 7PM-7AM, please contact night-coverage If you still have difficulty reaching the attending provider, please page the [facility] (Director on Call) for [facility] on amion for assistance.

## 2022-06-13 NOTE — Progress Notes (Signed)
Inpatient Rehab Admissions Coordinator:    Cir following for potential admit. Not medically ready at this time. Per note, Pt. Likely to need PEG prior to admit. I did confirm with her family that they will rotate to provide 24/7 assist at dc.  Megan Salon, MS, CCC-SLP Rehab Admissions Coordinator  [phone] (celll) [phone] (office)

## 2022-06-13 NOTE — Progress Notes (Signed)
eLink Physician-Brief Progress Note Patient Name: Kristin Simmons DOB: May 08, 1985 MRN: [id number]   Date of Service  06/13/2022  HPI/Events of Note  SBP 150s despite max dose of levophed.  (Goal SBP >180)  eICU Interventions  Increased max dose of levophed to 6mcg/min Added scheduled albumin 5%, 25g q6hours If goal BP still not met, will plan to add phenylephrine as well.          M DELA CRUZ 06/13/2022, 10:51 PM

## 2022-06-13 NOTE — Progress Notes (Signed)
Central line placed by CCM.  Pending CXR. Attempted to call mom x2 - went straight to VM.

## 2022-06-13 NOTE — Progress Notes (Signed)
SLP Cancellation Note  Patient Details Name: REGINIA BATTIE MRN: [id number] DOB: 1986-02-06   Cancelled treatment:       Reason Eval/Treat Not Completed: Repeat CT today, hallucinating more; not appropriate for MBS. Plan is to proceed with instrumental swallow study hopefully early this week if cognition allows.   L. Samson Frederic, MA CCC/SLP Clinical Specialist - Acute Care SLP Acute Rehabilitation Services Office number [phone]    Blenda Mounts Laurice 06/13/2022, 8:58 AM

## 2022-06-13 NOTE — Progress Notes (Signed)
Transcranial Doppler   Date POD PCO2 HCT BP   MCA ACA PCA OPHT SIPH VERT Basilar  4/17,rs         Right  Left   41  59   -35  -37   24 26 23  16    47  40   -25  -28   -36       4/19 GC         Right  Left   77  73   -35  -45   11  49   19  19   48  20   *  *   *       4/22  [facility]       139/86  Right  Left    118   83    *   -27   *   19    *   *    *   *    -26   -39    -76        4/24  RH        142/101 Right  Left    140   76   -22   -44    45   46    26   26    61   30    *   -30    -47        4/26  RH        136/93 Right  Left    177   57    -50   -43    47   23    22   24     59   45    -18   -25    -45        4/29 GC         Right  Left    125   100    -14   -73    14   -24    26   15     35   20    -31   -37    -41                 Right  Left                                                       MCA = Middle Cerebral Artery      OPHT = Opthalmic Artery     BASILAR = Basilar Artery   ACA = Anterior Cerebral Artery     SIPH = Carotid Siphon PCA = Posterior Cerebral Artery   VERT = Verterbral Artery                    Normal MCA = 62+\-12 ACA = 50+\-12 PCA = 42+\-23    * - Unable to insonate   Lindegaard Ratio - Right  5.68, Left 2.89  06/13/22 2:16 PM Olen Cordial RVT

## 2022-06-13 NOTE — Progress Notes (Signed)
Occupational Therapy Treatment Patient Details Name: Kristin Simmons MRN: [id number] DOB: 09-08-1985 Today's Date: 06/13/2022   History of present illness Pt is a 37yo female who was admitted for elective coiling of R MCA aneurysm however this was complicated by intraoperative rupture resulting in a R pterional craniectomy for clipping of MCA aneurysm. Placement of bone flap in subcutaneous abdominal pocket. PMH: anxiety, anemia, HTN, depression   OT comments  Patient continues to progress towards goals, kept eyes open 50% of the time (though increased swelling noted in R eye to date). Patient recalling OTs name x3 and making jokes towards OT and PT in session. Patient with improved attempts to visually track to the L, but requires manual assist of cervical rotation to the L to initiate movement of gaze. Patient more perseverative with ADL tasks sitting EOB, requiring cues to stop and engage in additional tasks. Patient max A of 2 to complete step pivot to recliner (minimal initiation of R leg noted). OT recommendation remains appropriate, OT will continue to follow.    Recommendations for follow up therapy are one component of a multi-disciplinary discharge planning process, led by the attending physician.  Recommendations may be updated based on patient status, additional functional criteria and insurance authorization.    Assistance Recommended at Discharge Frequent or constant Supervision/Assistance  Patient can return home with the following  Two people to help with walking and/or transfers;Assistance with cooking/housework;Assistance with feeding;Direct supervision/assist for medications management;Direct supervision/assist for financial management;Assist for transportation;Help with stairs or ramp for entrance;A lot of help with bathing/dressing/bathroom   Equipment Recommendations  Other (comment) (will continue to assess)    Recommendations for Other Services      Precautions /  Restrictions Precautions Precautions: Fall Precaution Comments: R craniectomy, no bone flap, bone flap placed in abdomen, R mitt Restrictions Weight Bearing Restrictions: No       Mobility Bed Mobility Overal bed mobility: Needs Assistance Bed Mobility: Supine to Sit     Supine to sit: HOB elevated, Max assist, +2 for safety/equipment     General bed mobility comments: moved R leg off bed, assist for L leg off bed, assist to support neck and lift trunk    Transfers Overall transfer level: Needs assistance Equipment used: 2 person hand held assist Transfers: Sit to/from Stand, Bed to chair/wheelchair/BSC Sit to Stand: Min assist, +2 safety/equipment     Step pivot transfers: Max assist, +2 safety/equipment     General transfer comment: stood with max A of 2 with support to L LE, cues for head control assist to block L LE for stepping with R and assist to move L LE     Balance Overall balance assessment: Needs assistance Sitting-balance support: Feet supported Sitting balance-Leahy Scale: Poor Sitting balance - Comments: mod to max A for balance and head control, pt needing about 50% help for head control about 70% of the time, min to mod A for sitting balance; used L hand to apply mouth moisturizer and to brush teeth Postural control: Posterior lean, Left lateral lean Standing balance support: Bilateral upper extremity supported Standing balance-Leahy Scale: Zero Standing balance comment: +2 for standing                           ADL either performed or assessed with clinical judgement   ADL Overall ADL's : Needs assistance/impaired Eating/Feeding: NPO   Grooming: Minimal assistance;Oral care;Cueing for sequencing Grooming Details (indicate cue type and reason): Patient completing  oral care in session, perseverative and requiring cues to stop putting on chapstick and brushing teeth in session                 Toilet Transfer: Maximal assistance;+2  for physical assistance;+2 for safety/equipment;Stand-pivot Toilet Transfer Details (indicate cue type and reason): Max A of 2 to transition to chair, able to assist with R leg minimally Toileting- Clothing Manipulation and Hygiene: Total assistance Toileting - Clothing Manipulation Details (indicate cue type and reason): rectal tube and foley currently       General ADL Comments: Session focus on increasing overall awareness, activity tolerance, and further assessments of cognition, vision, and ADL management.    Extremity/Trunk Assessment Upper Extremity Assessment LUE Deficits / Details: no activation noted            Vision   Vision Assessment?: Vision impaired- to be further tested in functional context Additional Comments: Patient with improved ability to track to the L however attention is sporadic and cannot maintain attention to complete full assessment   Perception     Praxis      Cognition Arousal/Alertness: Awake/alert Behavior During Therapy: Restless Overall Cognitive Status: Impaired/Different from baseline Area of Impairment: Attention, Following commands, Safety/judgement, Problem solving, Awareness, Memory                   Current Attention Level: Focused Memory: Decreased short-term memory Following Commands: Follows one step commands with increased time, Follows one step commands consistently Safety/Judgement: Decreased awareness of safety, Decreased awareness of deficits Awareness: Intellectual Problem Solving: Slow processing, Decreased initiation, Difficulty sequencing, Requires verbal cues, Requires tactile cues General Comments: able to recall therapist's name and making nick names she called throughout session, rambling speech        Exercises      Shoulder Instructions       General Comments HR to 136 with sitting/standing activities    Pertinent Vitals/ Pain       Pain Assessment Pain Assessment: Faces Faces Pain Scale: Hurts  even more Pain Location: neck with less support or with moving to L and rectum in sitting Pain Descriptors / Indicators: Grimacing, Moaning Pain Intervention(s): Limited activity within patient's tolerance, Monitored during session, Repositioned, Patient requesting pain meds-RN notified  Home Living                                          Prior Functioning/Environment              Frequency  Min 2X/week        Progress Toward Goals  OT Goals(current goals can now be found in the care plan section)  Progress towards OT goals: Progressing toward goals  Acute Rehab OT Goals Patient Stated Goal: to get out of here OT Goal Formulation: Patient unable to participate in goal setting Time For Goal Achievement: 06/15/22 Potential to Achieve Goals: Good  Plan Discharge plan remains appropriate    Co-evaluation      Reason for Co-Treatment: Complexity of the patient's impairments (multi-system involvement);Necessary to address cognition/behavior during functional activity;For patient/therapist safety;To address functional/ADL transfers PT goals addressed during session: Mobility/safety with mobility;Balance        AM-PAC OT "6 Clicks" Daily Activity     Outcome Measure   Help from another person eating meals?: Total Help from another person taking care of personal grooming?: A Little Help from another  person toileting, which includes using toliet, bedpan, or urinal?: Total Help from another person bathing (including washing, rinsing, drying)?: A Lot Help from another person to put on and taking off regular upper body clothing?: A Lot Help from another person to put on and taking off regular lower body clothing?: Total 6 Click Score: 10    End of Session Equipment Utilized During Treatment: Gait belt  OT Visit Diagnosis: Muscle weakness (generalized) (M62.81);Other abnormalities of gait and mobility (R26.89);Unsteadiness on feet (R26.81);Other symptoms  and signs involving cognitive function;Hemiplegia and hemiparesis Hemiplegia - Right/Left: Left Hemiplegia - dominant/non-dominant: Non-Dominant 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 , OTR/L Acute Rehabilitation Services [phone]   Cherlyn Cushing 06/13/2022, 11:43 AM

## 2022-06-13 NOTE — Progress Notes (Signed)
Physical Therapy Treatment Patient Details Name: Kristin Simmons MRN: [id number] DOB: [birth year] Today's Date: 06/13/2022   History of Present Illness Pt is a 37yo female who was admitted for elective coiling of R MCA aneurysm however this was complicated by intraoperative rupture resulting in a R pterional craniectomy for clipping of MCA aneurysm. Placement of bone flap in subcutaneous abdominal pocket. PMH: anxiety, anemia, HTN, depression    PT Comments    Patient progressing slowly.  Slightly more engaged with conversation appropriate to task today.  Still needing support for head control, but can turn to L x 2 during session with assist to initiate after up in chair with head supported.  She has pain with the neck when unsupported and during stretching to L as well as with stretching L LE.  She seemed more alert as well throughout and eyes open ~50% of the time though noted R eye more swollen.  She remains appropriate for post-acute inpatient rehab.  PT will follow.   Recommendations for follow up therapy are one component of a multi-disciplinary discharge planning process, led by the attending physician.  Recommendations may be updated based on patient status, additional functional criteria and insurance authorization.  Follow Up Recommendations       Assistance Recommended at Discharge Frequent or constant Supervision/Assistance  Patient can return home with the following Two people to help with walking and/or transfers;A lot of help with bathing/dressing/bathroom;Assistance with cooking/housework;Direct supervision/assist for medications management;Direct supervision/assist for financial management;Assist for transportation;Help with stairs or ramp for entrance   Equipment Recommendations  Other (comment) (TBA)    Recommendations for Other Services       Precautions / Restrictions Precautions Precautions: Fall Precaution Comments: R craniectomy, no bone flap, bone flap placed  in abdomen, R mitt Restrictions Weight Bearing Restrictions: No     Mobility  Bed Mobility Overal bed mobility: Needs Assistance Bed Mobility: Supine to Sit     Supine to sit: HOB elevated, Max assist, +2 for safety/equipment     General bed mobility comments: moved R leg off bed, assist for L leg off bed, assist to support neck and lift trunk    Transfers Overall transfer level: Needs assistance Equipment used: 2 person hand held assist Transfers: Sit to/from Stand, Bed to chair/wheelchair/BSC Sit to Stand: Min assist, +2 safety/equipment   Step pivot transfers: Max assist, +2 safety/equipment       General transfer comment: stood with max A of 2 with support to L LE, cues for head control assist to block L LE for stepping with R and assist to move L LE    Ambulation/Gait               General Gait Details: pt unable   Stairs             Wheelchair Mobility    Modified Rankin (Stroke Patients Only) Modified Rankin (Stroke Patients Only) Pre-Morbid Rankin Score: No symptoms Modified Rankin: Severe disability     Balance Overall balance assessment: Needs assistance Sitting-balance support: Feet supported Sitting balance-Leahy Scale: Poor Sitting balance - Comments: mod to max A for balance and head control, pt needing about 50% help for head control about 70% of the time, min to mod A for sitting balance; used L hand to apply mouth moisturizer and to brush teeth Postural control: Posterior lean, Left lateral lean Standing balance support: Bilateral upper extremity supported Standing balance-Leahy Scale: Zero Standing balance comment: +2 for standing  Cognition Arousal/Alertness: Awake/alert Behavior During Therapy: Restless Overall Cognitive Status: Impaired/Different from baseline Area of Impairment: Attention, Following commands, Safety/judgement, Problem solving, Awareness, Memory                    Current Attention Level: Focused Memory: Decreased short-term memory Following Commands: Follows one step commands with increased time, Follows one step commands consistently Safety/Judgement: Decreased awareness of safety, Decreased awareness of deficits Awareness: Intellectual Problem Solving: Slow processing, Decreased initiation, Difficulty sequencing, Requires verbal cues, Requires tactile cues General Comments: able to recall therapist's name and making nick names she called throughout session, rambling speech        Exercises      General Comments General comments (skin integrity, edema, etc.): HR to 136 with sitting/standing activities      Pertinent Vitals/Pain Pain Assessment Faces Pain Scale: Hurts even more Pain Location: neck with less support or with moving to L and rectum in sitting Pain Descriptors / Indicators: Grimacing, Moaning Pain Intervention(s): Monitored during session, Repositioned, Limited activity within patient's tolerance    Home Living                          Prior Function            PT Goals (current goals can now be found in the care plan section) Progress towards PT goals: Progressing toward goals    Frequency    Min 4X/week      PT Plan Current plan remains appropriate    Co-evaluation PT/OT/SLP Co-Evaluation/Treatment: Yes Reason for Co-Treatment: Complexity of the patient's impairments (multi-system involvement);Necessary to address cognition/behavior during functional activity;For patient/therapist safety;To address functional/ADL transfers PT goals addressed during session: Mobility/safety with mobility;Balance        AM-PAC PT "6 Clicks" Mobility   Outcome Measure  Help needed turning from your back to your side while in a flat bed without using bedrails?: Total Help needed moving from lying on your back to sitting on the side of a flat bed without using bedrails?: Total Help needed moving to and from a bed  to a chair (including a wheelchair)?: Total Help needed standing up from a chair using your arms (e.g., wheelchair or bedside chair)?: Total Help needed to walk in hospital room?: Total Help needed climbing 3-5 steps with a railing? : Total 6 Click Score: 6    End of Session Equipment Utilized During Treatment: Gait belt Activity Tolerance: Patient tolerated treatment well Patient left: in chair;with chair alarm set;with call bell/phone within reach;with restraints reapplied;with family/visitor present   PT Visit Diagnosis: Unsteadiness on feet (R26.81);Hemiplegia and hemiparesis Hemiplegia - Right/Left: Left Hemiplegia - dominant/non-dominant: Non-dominant Hemiplegia - caused by: Nontraumatic intracerebral hemorrhage     Time: 1191-4782 PT Time Calculation (min) (ACUTE ONLY): 41 min  Charges:  $Therapeutic Activity: 23-37 mins                     Sheran Lawless, PT Acute Rehabilitation Services Office:[phone] 06/13/2022    [name] 06/13/2022, 11:07 AM

## 2022-06-13 NOTE — Progress Notes (Signed)
NAME:  Kristin Simmons, MRN:  [id number], DOB:  [birth year], LOS: 13 ADMISSION DATE:  05/31/2022, CONSULTATION DATE: 06/13/2022 REFERRING MD: Conchita Paris, CHIEF COMPLAINT: Vasospasm  History of Present Illness:  37 year old woman who presented for elective right clipping of MCA aneurysm.  Experienced intraoperative rupture required mechanical ventilation.  Developed MCA territory edema with hemorrhagic transformation.  Eventually extubated and transferred to the floor.  Recently she has developed hyponatremia with sodium as low as 123.  She was treated for haemophilus pneumonia  Has been improving with increasing periods of wakefulness but with weakness on the left side.  On 4/29, she experienced a change in mental status CT scan showed evidence of severe vasospasm.  She was transferred back to the ICU for hemodynamic augmentation.  Pertinent  Medical History   Past Medical History:  Diagnosis Date   Anemia    Anxiety    Arthritis    Asthma    Cancer (HCC)    cervical   Depression    Headache    Hypertension    She was taking her mother's blood pressure meds but states she doesn't need bp meds now.   Vaginal Pap smear, abnormal     Significant Hospital Events: Including procedures, antibiotic start and stop dates in addition to other pertinent events   4/16, she was electively admitted by neurosurgery and underwent an right pterional craniectomy for clipping of MCA aneurysm, complex due to intraoperative rupture and need for temporary MCA occlusion. Postprocedure, she stayed on ventilator, moved to ICU and was also started on Cardene drip. Patient was seen by [facility] service as a consult for medical management. 4/18 CT head showed increased anterior and middle division Rt MCA territory edema with hemorrhagic transformation, increased mass effect with 4 mm leftward shift 4/20 extubated 4/23 [facility] was involved in care as a consult for medical management. 4/28 echocardiogram 7075% EF,  grade 1 diastolic dysfunction, mild LVH  4/29 change in mental status with vasospasm on CT.  Interim History / Subjective:  Patient is awake with confused speech.  Objective   Blood pressure (!) 127/97, pulse 93, temperature 98.1 F (36.7 C), temperature source Axillary, resp. rate 13, height 5\' 4"  (1.626 m), weight 48.8 kg, last menstrual period 04/26/2022, SpO2 100 %.        Intake/Output Summary (Last 24 hours) at 06/13/2022 1914 Last data filed at 06/13/2022 1800 Gross per 24 hour  Intake 716.67 ml  Output 1585 ml  Net -868.33 ml   Filed Weights   06/08/22 0457 06/09/22 0500 06/11/22 0500  Weight: 53.6 kg 57.4 kg 48.8 kg    Examination: General: Thin woman appears her stated age. HENT: Cranial surgery head wrap. Lungs: Clear to auscultation Cardiovascular: Heart sounds unremarkable Abdomen: Small bore feeding tube in place.  Abdomen soft nontender Extremities: No edema. Neuro: Awake.  Answers questions conversant but speech confused.  Right gaze preference.  Moves right arm purposefully.  No movement on left side. GU: Clear urine.  Ancillary tests personally reviewed  Sodium 133.  Mild transaminitis.  WBC 14.  Hemoglobin 12.3. CT scan shows evolving right MCA territory infarct with herniation of tissue through the through the craniectomy.  New hypodense in the parietal lobe.  Severe vasospasm of the supraclinoid right MCA. TCD's indicating right MCA vasospasm with Lindegaard ratio of 5.68.  Assessment & Plan:  Critically ill due to clinical and radiographic vasospasm due to aneurysmal subarachnoid hemorrhage of right MCA aneurysm. Evidence of right MCA infarction  Plan:  -Fluid  load: Bolus and followed by IV normal saline. -Track CVP: Target CVP greater than 8. -Start norepinephrine and titrate to keep systolic blood pressure greater than 180 -Follow serial neurological examination. -Follow serum sodium.  Best Practice (right click and "Reselect all SmartList  Selections" daily)   Diet/type: tubefeeds DVT prophylaxis: SCD GI prophylaxis: H2B Lines: Central line Foley:  N/A Code Status:  full code Last date of multidisciplinary goals of care discussion [Per primary team]    CRITICAL CARE Performed by: Lynnell Catalan   Total critical care time: 35 minutes  Critical care time was exclusive of separately billable procedures and treating other patients.  Critical care was necessary to treat or prevent imminent or life-threatening deterioration.  Critical care was time spent personally by me on the following activities: development of treatment plan with patient and/or surrogate as well as nursing, discussions with consultants, evaluation of patient's response to treatment, examination of patient, obtaining history from patient or surrogate, ordering and performing treatments and interventions, ordering and review of laboratory studies, ordering and review of radiographic studies, pulse oximetry, re-evaluation of patient's condition and participation in multidisciplinary rounds.  Lynnell Catalan, MD [facility] ICU Physician [facility] [place] [facility]  Pager: [phone] Mobile: [phone] After hours: [phone].

## 2022-06-14 DIAGNOSIS — I739 Peripheral vascular disease, unspecified: Secondary | ICD-10-CM

## 2022-06-14 DIAGNOSIS — Z9889 Other specified postprocedural states: Secondary | ICD-10-CM | POA: Diagnosis not present

## 2022-06-14 LAB — URINALYSIS, ROUTINE W REFLEX MICROSCOPIC
Bilirubin Urine: NEGATIVE
Glucose, UA: 50 mg/dL — AB
Hgb urine dipstick: NEGATIVE
Ketones, ur: NEGATIVE mg/dL
Leukocytes,Ua: NEGATIVE
Nitrite: NEGATIVE
Protein, ur: NEGATIVE mg/dL
Specific Gravity, Urine: 1.013 (ref 1.005–1.030)
pH: 6 (ref 5.0–8.0)

## 2022-06-14 LAB — COMPREHENSIVE METABOLIC PANEL
ALT: 196 U/L — ABNORMAL HIGH (ref 0–44)
AST: 59 U/L — ABNORMAL HIGH (ref 15–41)
Albumin: 3.1 g/dL — ABNORMAL LOW (ref 3.5–5.0)
Alkaline Phosphatase: 77 U/L (ref 38–126)
Anion gap: 8 (ref 5–15)
BUN: 11 mg/dL (ref 6–20)
CO2: 23 mmol/L (ref 22–32)
Calcium: 9.1 mg/dL (ref 8.9–10.3)
Chloride: 102 mmol/L (ref 98–111)
Creatinine, Ser: 0.54 mg/dL (ref 0.44–1.00)
GFR, Estimated: >60 mL/min (ref >60)
Glucose, Bld: 145 mg/dL — ABNORMAL HIGH (ref 70–99)
Potassium: 4.3 mmol/L (ref 3.5–5.1)
Sodium: 133 mmol/L — ABNORMAL LOW (ref 135–145)
Total Bilirubin: 0.2 mg/dL — ABNORMAL LOW (ref 0.3–1.2)
Total Protein: 7.4 g/dL (ref 6.5–8.1)

## 2022-06-14 LAB — CBC
HCT: 28.8 % — ABNORMAL LOW (ref 36.0–46.0)
Hemoglobin: 10.3 g/dL — ABNORMAL LOW (ref 12.0–15.0)
MCH: 32.1 pg (ref 26.0–34.0)
MCHC: 35.8 g/dL (ref 30.0–36.0)
MCV: 89.7 fL (ref 80.0–100.0)
Platelets: 735 10*3/uL — ABNORMAL HIGH (ref 150–400)
RBC: 3.21 MIL/uL — ABNORMAL LOW (ref 3.87–5.11)
RDW: 13.6 % (ref 11.5–15.5)
WBC: 17.3 10*3/uL — ABNORMAL HIGH (ref 4.0–10.5)
nRBC: 0 % (ref 0.0–0.2)

## 2022-06-14 MED ORDER — NIMODIPINE 30 MG PO CAPS: 30.0000 mg | ORAL_CAPSULE | ORAL | Status: DC

## 2022-06-14 MED ORDER — NIMODIPINE 6 MG/ML PO SOLN: 30.0000 mg | ORAL | Status: DC

## 2022-06-14 MED ORDER — NOREPINEPHRINE 16 MG/250ML-% IV SOLN
0.0000 ug/min | INTRAVENOUS | Status: DC
  Administered 2022-06-14: 50 ug/min via INTRAVENOUS
  Administered 2022-06-14: 48 ug/min via INTRAVENOUS
  Administered 2022-06-14 – 2022-06-17 (×13): 50 ug/min via INTRAVENOUS
  Administered 2022-06-18: 13 ug/min via INTRAVENOUS
  Administered 2022-06-18: 15.147 ug/min via INTRAVENOUS
  Filled 2022-06-14 (×16): qty 250

## 2022-06-14 MED ORDER — NIMODIPINE 6 MG/ML PO SOLN
30.0000 mg | Freq: Once | ORAL | Status: AC
  Administered 2022-06-14: 30 mg via ORAL

## 2022-06-14 MED ORDER — NIMODIPINE 6 MG/ML PO SOLN
30.0000 mg | ORAL | Status: DC
  Administered 2022-06-14 – 2022-06-21 (×80): 30 mg
  Filled 2022-06-14 (×3): qty 10
  Filled 2022-06-14: qty 5
  Filled 2022-06-14: qty 10
  Filled 2022-06-14: qty 5
  Filled 2022-06-14 (×7): qty 10
  Filled 2022-06-14: qty 5
  Filled 2022-06-14 (×7): qty 10
  Filled 2022-06-14: qty 5
  Filled 2022-06-14: qty 10
  Filled 2022-06-14: qty 5
  Filled 2022-06-14 (×8): qty 10
  Filled 2022-06-14 (×2): qty 5
  Filled 2022-06-14 (×14): qty 10
  Filled 2022-06-14 (×2): qty 5
  Filled 2022-06-14 (×9): qty 10
  Filled 2022-06-14: qty 5
  Filled 2022-06-14 (×3): qty 10
  Filled 2022-06-14: qty 5
  Filled 2022-06-14: qty 10
  Filled 2022-06-14: qty 5
  Filled 2022-06-14 (×2): qty 10
  Filled 2022-06-14 (×2): qty 5

## 2022-06-14 MED ORDER — VASOPRESSIN 20 UNITS/100 ML INFUSION FOR SHOCK
0.0000 [IU]/min | INTRAVENOUS | Status: DC
  Administered 2022-06-14 – 2022-06-15 (×4): 0.03 [IU]/min via INTRAVENOUS
  Filled 2022-06-14 (×2): qty 100
  Filled 2022-06-14: qty 300
  Filled 2022-06-14 (×2): qty 100

## 2022-06-14 MED ORDER — SODIUM CHLORIDE 1 G PO TABS
2.0000 g | ORAL_TABLET | Freq: Three times a day (TID) | ORAL | Status: DC
  Administered 2022-06-14 – 2022-06-21 (×22): 2 g
  Filled 2022-06-14 (×22): qty 2

## 2022-06-14 MED ORDER — NIMODIPINE 30 MG PO CAPS
30.0000 mg | ORAL_CAPSULE | ORAL | Status: DC
  Administered 2022-06-14: 30 mg via ORAL
  Filled 2022-06-14 (×2): qty 1

## 2022-06-14 NOTE — Progress Notes (Signed)
Inpatient Rehab Admissions Coordinator:    CIR following, not medically ready at this time.   Megan Salon, MS, CCC-SLP Rehab Admissions Coordinator  [phone] (celll) [phone] (office)

## 2022-06-14 NOTE — Progress Notes (Signed)
NEUROSURGERY PROGRESS NOTE   Pt seen and examined. No issues overnight. CVC placed and pressors/colloid started.  EXAM: Temp:  [97.6 F (36.4 C)-99.9 F (37.7 C)] 99.9 F (37.7 C) (04/30 0800) Pulse Rate:  [70-126] 84 (04/30 1030) Resp:  [13-25] 25 (04/30 1030) BP: (123-218)/(71-121) 158/86 (04/30 1030) SpO2:  [89 %-100 %] 100 % (04/30 1030) Intake/Output      04/29 0701 04/30 0700 04/30 0701 05/01 0700   I.V. (mL/kg) 1234.3 (25.3) 1359.1 (27.9)   NG/GT 670 90   IV Piggyback 412 239.2   Total Intake(mL/kg) 2316.3 (47.5) 1688.3 (34.6)   Urine (mL/kg/hr) 2475 (2.1) 1150 (4.7)   Emesis/NG output 150    Stool 301    Total Output 2926 1150   Net -609.8 +538.3         Awake, alert, mildly confused Pupils 2mm, reactive Conversant with me and SLP at bedside Following commands RUE/RLE, moves spontaneously and purposefully No voluntary movement LUE/LLE Wound c/d/I, softer today  LABS: Lab Results  Component Value Date   CREATININE 0.54 06/14/2022   BUN 11 06/14/2022   NA 133 (L) 06/14/2022   K 4.3 06/14/2022   CL 102 06/14/2022   CO2 23 06/14/2022   Lab Results  Component Value Date   WBC 17.3 (H) 06/14/2022   HGB 10.3 (L) 06/14/2022   HCT 28.8 (L) 06/14/2022   MCV 89.7 06/14/2022   PLT 735 (H) 06/14/2022     TCD: 4/17,rs         Right  Left   41  59   -35  -37   24 26 23  16    47  40   -25  -28   -36       4/19 GC         Right  Left   77  73   -35  -45   11  49   19  19   48  20   *  *   *       4/22  [facility]       139/86  Right  Left    118   83    *   -27   *   19    *   *    *   *    -26   -39    -76        4/24  [facility]        142/101 Right  Left    140   76   -22   -44    45   46    26   26    61   30    *   -30    -47        4/26  RH        136/93 Right  Left    177   57    -50   -43    47   23    22   24     59   45    -18   -25    -45        4/29 GC         Right  Left    125   100     -14   -73    14   -24    26   15     35   20    -  31   -37    -41     IMPRESSION: - 37 y.o. female POD#14 with intraoperative aneurysm rupture during opening. Exam appears stable - VAP - Hyponatermia improving - ? Vasospasm involving proximal right M1/distal ICA. Exam perhaps somewhat improved today with hyperdynamic therapy - Increasing leukocytosis  PLAN: - Will cont with SBP goal > for now. - Cont Keppra - Continue Nimotop - Will increase Na tabs back to 2g TID - Re-culture today   Lisbeth Renshaw, MD [facility]

## 2022-06-14 NOTE — Progress Notes (Signed)
TOC continuing to follow for medical readiness and needs.     LCSW, MSW, MHA 

## 2022-06-14 NOTE — Progress Notes (Signed)
TOC continuing to follow for medical readiness and needs.   Joaquin Courts, MSW, [facility]

## 2022-06-14 NOTE — Progress Notes (Signed)
[facility] Consultation Progress Note  Patient: Kristin Simmons WUJ:[id number]   PCP: Kizzie Furnish D., PA-C DOB: [birth year]   DOA: 05/31/2022   DOS: 06/14/2022   Date of Service: the patient was seen and examined on 06/14/2022 Primary service: Lisbeth Renshaw, MD   Brief hospital course: PMH of HTN, smoker, anxiety and depression, cervical cancer who present to the hospital for elective right pterional craniotomy for clipping of MCA aneurysm.  4/16, she was electively admitted by neurosurgery and underwent an right pterional craniectomy for clipping of MCA aneurysm, complex due to intraoperative rupture and need for temporary MCA occlusion. Postprocedure, she stayed on ventilator, moved to ICU and was also started on Cardene drip. Patient was seen by [facility] service as a consult for medical management. 4/18 CT head showed increased anterior and middle division Rt MCA territory edema with hemorrhagic transformation, increased mass effect with 4 mm leftward shift 4/20 extubated 4/23 [facility] was involved in care as a consult for medical management. 4/28 echocardiogram 7075% EF, grade 1 diastolic dysfunction, mild LVH  4/29 patient started level continue to be stable even after reducing the dose of sodium tablet. 4/30: Repeat CT of the head shows worsening of edema and herniation currently patient is on hypodynamic therapy.  Assessment and Plan: Hyponatremia Appears euvolemic. Sodium went as low as 126.  Currently improved to 132. Patient was on HCTZ triamterene which is currently stopped. Osmolarity 284. Urine studies ordered to understand further. Currently on sodium tablets per neurosurgery.  Will reduce the dose from 2G-1G. After that sodium levels continue to be stable at 133.   Per neurosurgery do not initiate fluid restriction for the patient with an SAH. Today sodium tablets increased back to 2 g as the patient still has significant edema with a CT of the head    Rt MCA  aneurysm clipping  Intraoperative aneurysm rupture s/p decompressive craniectomy Postop cerebral edema with hemorrhagic transformation and mass effect Neurosurgery following Neurological status seems to be waxing and waning with periods of wakefulness, somnolence and restlessness.  However has significant weakness on the left side, significant dysphagia and is getting tube feeding.  Continue to monitor for neurological progress Currently on Keppra 500 mg twice daily. Repeat CT of the head has been ordered to assess the status of the SDH, it shows evolving large right MCA territory, severe vasospasm of the right M1/MCA segment.  Neurologist started the patient on hypodynamic therapy patient placed on vasopressors, nimodipine  and salt tablets dose increased to 2 grams 3 times a day.  Sinus tachycardia  Accelerated hypertension. Postprocedure, patient had elevated blood pressure and briefly required Cardene drip.  Currently on Coreg 3.125 mg twice daily and nimodipine 60 mg every 4 hours.  Increase the dose of Coreg to 6.25 due to  tachycardia Continue to monitor heart rate and blood pressure.  SBP goal <135mmHg. TSH and free T4 were checked which appears to be normal.   Echocardiogram shows hyperdynamic circulation.    Haemophilus influenza/Enterobacter pneumonia Tracheal aspirate showed haemophilus influenza and Enterobacter.  Currently on cefepime IV, planned for 1 week course to complete on 4/27.   Postop respiratory insufficiency Extubated 4/20.  Currently on room air.   Dysphagia Currently on Cortrak tube feeding.  SLP following.  With the slow neurological recovery she is having, she probably would need PEG tube.   LFT elevation. LFT check on 4/27 shows AST of 228 and ALT of 456. Bilirubin normal. Prior value in 4/21 were 44 and 37 respectively.  Suspect this is secondary to cefepime. Patient completing antibiotic already on 4/27. Improving as of 4/28.  Monitor.      Agitation Has not been requiring lately but orders in for PRN Klonopin 0.5 mg twice daily and PRN oxycodone 5mg  TID.  Patient appeared to be more or less calm.   Urinary rentention Foley catheter was removed on 4/22 but could not pass voiding trial.  Foley reinserted 4/24.   Initiate bethanechol.  For concerns for neurogenic bladder.  We will continue to follow the patient.   Subjective: Patient was seen at bedside today.  Patient was found to be less interactive today.    Objective: Vitals:   06/14/22 1445 06/14/22 1500 06/14/22 1515 06/14/22 1530  BP: (!) 197/99 (!) 184/101 (!) 196/127 (!) 202/109  Pulse: 73 (!) 58 77   Resp: 19 20 (!) 23 (!) 24  Temp:      TempSrc:      SpO2: 100% 100% 94%   Weight:      Height:        General: in Mild distress, No Rash Cardiovascular: S1 and S2 Present, No Murmur Respiratory: Good respiratory effort, Bilateral Air entry present. No Crackles, No wheezes Abdomen: Bowel Sound present, No tenderness Extremities: No edema Neuro: Alert and oriented x3, no new focal deficit, left-sided weakness.  Family Communication: No one at bedside  Data Reviewed: Since last encounter, pertinent lab results CBC and BMP   . I have ordered test including CBC and BMP  .   Author: Lynden Oxford, MD 06/14/2022 3:36 PM  To reach On-call, see care teams to locate the attending and reach out to them via [email or web address]. If 7PM-7AM, please contact night-coverage If you still have difficulty reaching the attending provider, please page the [facility] (Director on Call) for [facility] on amion for assistance.

## 2022-06-14 NOTE — Progress Notes (Signed)
Physical Therapy Treatment Patient Details Name: Kristin Simmons MRN: [id number] DOB: 03-13-1985 Today's Date: 06/14/2022   History of Present Illness Pt is a 37yo female who was admitted for elective coiling of R MCA aneurysm however this was complicated by intraoperative rupture resulting in a R pterional craniectomy for clipping of MCA aneurysm. Placement of bone flap in subcutaneous abdominal pocket. PMH: anxiety, anemia, HTN, depression    PT Comments    Patient progressing slowly.  Some improved sitting balance noted today for about 2 minutes prior to pushing to L.  Attempting initiation of core strength for cervical musculature.  Feel she will continue to benefit from skilled PT in the acute setting and will need follow up inpatient rehab prior to d/c home.   Recommendations for follow up therapy are one component of a multi-disciplinary discharge planning process, led by the attending physician.  Recommendations may be updated based on patient status, additional functional criteria and insurance authorization.  Follow Up Recommendations       Assistance Recommended at Discharge Frequent or constant Supervision/Assistance  Patient can return home with the following Two people to help with walking and/or transfers;A lot of help with bathing/dressing/bathroom;Assistance with cooking/housework;Direct supervision/assist for medications management;Direct supervision/assist for financial management;Assist for transportation;Help with stairs or ramp for entrance   Equipment Recommendations  Other (comment) (TBA)    Recommendations for Other Services       Precautions / Restrictions Precautions Precautions: Fall Precaution Comments: R craniectomy, no bone flap, bone flap placed in abdomen, R wrist restraint, coretrak, flexiseal     Mobility  Bed Mobility Overal bed mobility: Needs Assistance Bed Mobility: Supine to Sit Rolling: Max assist         General bed mobility  comments: laying on L side in bed c/o R neck pain; assisted to lift trunk with pad under pt and she supported neck    Transfers Overall transfer level: Needs assistance Equipment used: 2 person hand held assist Transfers: Bed to chair/wheelchair/BSC   Stand pivot transfers: Max assist, +2 physical assistance         General transfer comment: assist for blocking L LE and moving to chair on R using R hand    Ambulation/Gait               General Gait Details: pt unable   Stairs             Wheelchair Mobility    Modified Rankin (Stroke Patients Only) Modified Rankin (Stroke Patients Only) Pre-Morbid Rankin Score: No symptoms Modified Rankin: Severe disability     Balance Overall balance assessment: Needs assistance   Sitting balance-Leahy Scale: Poor Sitting balance - Comments: assist for head control seated EOB and pt maintaining trunk with L UE for about 2 minutes prior to pushing to L Postural control: Left lateral lean   Standing balance-Leahy Scale: Zero Standing balance comment: +2 for standing                            Cognition Arousal/Alertness: Awake/alert Behavior During Therapy: Restless Overall Cognitive Status: Impaired/Different from baseline Area of Impairment: Attention, Following commands, Safety/judgement, Problem solving, Awareness, Memory                   Current Attention Level: Sustained Memory: Decreased short-term memory Following Commands: Follows one step commands with increased time, Follows one step commands consistently Safety/Judgement: Decreased awareness of safety, Decreased awareness of deficits Awareness: Intellectual  Problem Solving: Slow processing, Decreased initiation, Difficulty sequencing, Requires verbal cues, Requires tactile cues General Comments: able to focus several minutes to maintain balance on EOB with head support and L UE support        Exercises Other Exercises Other  Exercises: seated encouraged head press/chin tuck with head supported on therapist's hand to initiate core strength 5 reps x 5 sec hold with minimal participation    General Comments General comments (skin integrity, edema, etc.): RN in room to assist with lines, set up for positioning in chair and placed bed pad and towel roll and wedge for trying to keep head in midline      Pertinent Vitals/Pain Pain Assessment Pain Assessment: Faces Faces Pain Scale: Hurts whole lot Pain Location: neck Pain Descriptors / Indicators: Grimacing, Moaning Pain Intervention(s): Monitored during session, Repositioned, Utilized relaxation techniques, Heat applied    Home Living                          Prior Function            PT Goals (current goals can now be found in the care plan section) Progress towards PT goals: Progressing toward goals    Frequency    Min 4X/week      PT Plan Current plan remains appropriate    Co-evaluation              AM-PAC PT "6 Clicks" Mobility   Outcome Measure  Help needed turning from your back to your side while in a flat bed without using bedrails?: Total Help needed moving from lying on your back to sitting on the side of a flat bed without using bedrails?: Total Help needed moving to and from a bed to a chair (including a wheelchair)?: Total Help needed standing up from a chair using your arms (e.g., wheelchair or bedside chair)?: Total Help needed to walk in hospital room?: Total Help needed climbing 3-5 steps with a railing? : Total 6 Click Score: 6    End of Session Equipment Utilized During Treatment: Gait belt Activity Tolerance: Patient tolerated treatment well Patient left: in chair;with chair alarm set;with call bell/phone within reach;with restraints reapplied;with nursing/sitter in room   PT Visit Diagnosis: Unsteadiness on feet (R26.81);Hemiplegia and hemiparesis Hemiplegia - dominant/non-dominant:  Non-dominant Hemiplegia - caused by: Nontraumatic intracerebral hemorrhage     Time: 1530-1602 PT Time Calculation (min) (ACUTE ONLY): 32 min  Charges:  $Therapeutic Activity: 23-37 mins                     Kristin Simmons, PT Acute Rehabilitation Services Office:[phone] 06/14/2022    Kristin Simmons 06/14/2022, 4:47 PM

## 2022-06-14 NOTE — Progress Notes (Signed)
Speech Language Pathology Treatment: Dysphagia;Cognitive-Linquistic  Patient Details Name: Kristin Simmons MRN: [id number] DOB: Jul 06, 1985 Today's Date: 06/14/2022 Time: 1050-1120 SLP Time Calculation (min) (ACUTE ONLY): 30 min  Assessment / Plan / Recommendation Clinical Impression  Pt alert and attentive to PO today. Vocal quality clear and able to follow oral motor commands. Lingual movement WNL, mild left facial nerve weakness noted. Pts head is firmly turned to the right and slightly right leaning despite repositioning and supported head. Pt takes straw sips of water and spoon bites of puree and ice cream without signs of aspiration. Vocal quality very strong. No anterior spillage only mild coating of ice cream around left gum line. Mother able to feed her bites and sips. Pt accept easily. Recommend starting full liquids for now. May advance diet further if pt tolerates well.   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 She remained on vent post op. ETT 4/16-4/20. Placement of bone flap in subcutaneous abdominal pocket. PMH: anxiety, anemia, HTN, depression.      SLP Plan  Continue with current plan of care      Recommendations for follow up therapy are one component of a multi-disciplinary discharge planning process, led by the attending physician.  Recommendations may be updated based on patient status, additional functional criteria and insurance authorization.    Recommendations  Diet  recommendations: Thin liquid Liquids provided via: Cup;Straw Medication Administration: Via alternative means Supervision: Full supervision/cueing for compensatory strategies;Staff to assist with self feeding                      Frequent or constant Supervision/Assistance Dysphagia, oropharyngeal phase (R13.12)     Continue with current plan of care     , Riley Nearing  06/14/2022, 12:13 PM

## 2022-06-14 NOTE — Progress Notes (Signed)
Nutrition Follow-up  DOCUMENTATION CODES:   Not applicable  INTERVENTION:   Recommend continue TF until diet advanced and pt able to meet >60% of her needs PO   PEPuP candidate  Tube feeding via Cortrak tube: Osmolite 1.5 @ 45 ml/hr (1080 ml per day) Prosource TF20 60 ml daily  Provides 1700 kcal, 87 gm protein, 820 ml free water daily  Continue 100 mg thiamine daily per tube and 1 mg folic acid daily per tube   Continue Banatrol Pt with diarrhea due to nimotop   Pt now allowed full liquids Recommend using capsule nimotop vs liquid to help with lessening diarrhea as the liquid form has a very high osmolality.   Will monitor PO intake and make adjustments to TF as needed.   NUTRITION DIAGNOSIS:   Inadequate oral intake related to inability to eat as evidenced by NPO status. Ongoing.   GOAL:   Patient will meet greater than or equal to 90% of their needs Met with TF at goal  MONITOR:   PO intake, TF tolerance  REASON FOR ASSESSMENT:   Consult Enteral/tube feeding initiation and management  ASSESSMENT:   Pt with PMH of current smoker, ETOH use - drinks 2 beers per day, anemia, HTN, anxiety, asthma, cervial cancer, depression, and headaches admitted with R carotid aneurysm s/p coil embolization. 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   Pt discussed during ICU rounds and with RN.  CIR following for post d/c Pt with change in mental status per CT pt with evidence of severe vasospasm.  CCM following again due to severe vasospasm now on pressors, levo and vaso, for SBP >180 Pt seen today by SLP, able to advance to full liquids. Mental status much better after pressors.   04/16 - s/p craniectomy and clipping of R MCA aneurysm  04/17 - s/p cortrak tube placement; per xray tip in body/antral region of the stomach  04/20 - extubated 04/24 - cortrak replaced; tip in gastric antrum per xray  04/29 - severe  vasospasm   Medications reviewed and include: banatrol, folic acid, lactinex TID, keppra, nimotop, thiamine  Levophed @ 50 mcg Neo-synephrine @ 90 mcg  Vasopressin @ 0.03 units   Labs reviewed: Na 133   Diet Order:   Diet Order             Diet full liquid Room service appropriate? Yes; Fluid consistency: Thin  Diet effective now                   EDUCATION NEEDS:   Not appropriate for education at this time  Skin:  Skin Assessment: Skin Integrity Issues: Skin Integrity Issues:: Incisions Incisions: head and abd  Last BM:  301 ml via FMS; pt on nimotop per tube  Height:   Ht Readings from Last 1 Encounters:  05/31/22 5\' 4"  (1.626 m)    Weight:   Wt Readings from Last 1 Encounters:  06/11/22 48.8 kg    BMI:  Body mass index is 18.47 kg/m.  Estimated Nutritional Needs:   Kcal:  1700-1900  Protein:  75-90 grams  Fluid:  >1.7 L/day  Cammy Copa., RD, LDN, CNSC See AMiON for contact information

## 2022-06-14 NOTE — Progress Notes (Signed)
NAME:  JACOBI RYANT, MRN:  [id number], DOB:  February 28, 1985, LOS: 14 ADMISSION DATE:  05/31/2022, CONSULTATION DATE: 06/13/2022 REFERRING MD: Conchita Paris - NSGY CHIEF COMPLAINT: Vasospasm  History of Present Illness:  37 year old woman who presented for elective right clipping of MCA aneurysm. Experienced intraoperative rupture requiring mechanical ventilation.  Developed MCA territory edema with hemorrhagic transformation.  Eventually extubated and transferred to the floor.  Recently she has developed hyponatremia with sodium as low as 123.  She was treated for haemophilus pneumonia.  Has been improving with increasing periods of wakefulness but with weakness on the left side.  On 4/29, she experienced a change in mental status CT scan showed evidence of severe vasospasm.  She was transferred back to the ICU for hemodynamic augmentation.  Pertinent Medical History:   Past Medical History:  Diagnosis Date   Anemia    Anxiety    Arthritis    Asthma    Cancer (HCC)    cervical   Depression    Headache    Hypertension    She was taking her mother's blood pressure meds but states she doesn't need bp meds now.   Vaginal Pap smear, abnormal    Significant Hospital Events: Including procedures, antibiotic start and stop dates in addition to other pertinent events   4/16 Electively admitted by neurosurgery and underwent an right pterional craniectomy for clipping of MCA aneurysm, complex due to intraoperative rupture and need for temporary MCA occlusion. Postprocedure, remained on ventilator, moved to ICU and was also started on Cardene drip. Patient was seen by [facility] service as a consult for medical management. 4/18 CT head showed increased anterior and middle division R MCA territory edema with hemorrhagic transformation, increased mass effect with 4 mm leftward shift 4/20 Extubated 4/23 TRH was involved in care as a consult for medical management. 4/28 Echocardiogram 70-75% EF, grade 1  diastolic dysfunction, mild LVH  4/29 Change in mental status with vasospasm on CT. PCCM reconsulted for pressors to achieve goal SBP > 180 in the setting of vasospasm, L IJ CVC placed. 4/30 Persistent mental status changes. NE , Neo and vaso added.  Interim History / Subjective:  Mental status changes concerning for vasospasm 4/29PM Repeat CTA showing severe vasospasm of supraclinoid R ICA, R A1/ACA and M1/MCA segments Triple H therapy initiated, goal SBP > 180 NE started, L IJ CVC placed Neo/Vaso added today Nimotop dosing frequency adjusted to avoid drops  Objective:  Blood pressure (!) 183/103, pulse 87, temperature 99.9 F (37.7 C), temperature source Axillary, resp. rate 20, height 5\' 4"  (1.626 m), weight 48.8 kg, last menstrual period 04/26/2022, SpO2 100 %. CVP:  [6 mmHg-10 mmHg] 8 mmHg      Intake/Output Summary (Last 24 hours) at 06/14/2022 0839 Last data filed at 06/14/2022 0600 Gross per 24 hour  Intake 907.83 ml  Output 2676 ml  Net -1768.17 ml    Filed Weights   06/08/22 0457 06/09/22 0500 06/11/22 0500  Weight: 53.6 kg 57.4 kg 48.8 kg   Physical Examination: General: Acutely ill-appearing young woman in NAD. Mumbling nonsensically. HEENT: Anicteric sclera, PERRL 2mm, R eye slightly disconjugate, dry mucous membranes. S/p craniectomy with gauze dressing in place. Cortrak present. Neuro: Awake, oriented x 1-2, at times lucid and others speaking nonsensically/inappropriately. Responds to verbal stimuli. Following commands consistently. Unilateral L-sided neglect noted.  CV: RRR, no m/g/r. PULM: Breathing even and unlabored on RA. Lung fields CTAB anteriorly. GI: Soft, nontender, nondistended. Normoactive bowel sounds. Extremities: No LE edema noted.  Skin: Warm/dry, no rashes.  Resolved Hospital Problem List:    Assessment & Plan:  Critically ill due to clinical and radiographic vasospasm due to aneurysmal subarachnoid hemorrhage of right MCA  aneurysm Evidence of right MCA infarction  - Neurosurgery primary - Triple H therapy for vasospasm - S/p albumin boluses - Goal SBP > 180, CVP > 8 - Levophed and Neosynephrine titrated to goal SBP + vasopressin - Continue nimotop - TCDs per protocol - Frequent neurochecks/serial neurologic exams - Continue Keppra for seizure ppx - Trend Na - Neuroprotective measures: HOB > 30 degrees, normoglycemia, normothermia, electrolytes WNL  Best Practice: (right click and "Reselect all SmartList Selections" daily)   Diet/type: tubefeeds DVT prophylaxis: SCD GI prophylaxis: H2B Lines: Central line Foley:  N/A Code Status:  full code Last date of multidisciplinary goals of care discussion [Per primary team]  Critical care time:   The patient is critically ill with multiple organ system failure and requires high complexity decision making for assessment and support, frequent evaluation and titration of therapies, advanced monitoring, review of radiographic studies and interpretation of complex data.   Critical Care Time devoted to patient care services, exclusive of separately billable procedures, described in this note is 38 minutes.  Tim Lair, PA-C [facility] 06/14/22 8:39 AM  Please see [email or web address] for pager details.  From 7A-7P if no response, please call [phone] After hours, please call ELink [phone]

## 2022-06-15 ENCOUNTER — Inpatient Hospital Stay (HOSPITAL_COMMUNITY): Payer: Medicaid Other

## 2022-06-15 DIAGNOSIS — E871 Hypo-osmolality and hyponatremia: Secondary | ICD-10-CM

## 2022-06-15 DIAGNOSIS — I609 Nontraumatic subarachnoid hemorrhage, unspecified: Secondary | ICD-10-CM

## 2022-06-15 LAB — COMPREHENSIVE METABOLIC PANEL
ALT: 136 U/L — ABNORMAL HIGH (ref 0–44)
AST: 40 U/L (ref 15–41)
Albumin: 3.5 g/dL (ref 3.5–5.0)
Alkaline Phosphatase: 89 U/L (ref 38–126)
Anion gap: 9 (ref 5–15)
BUN: 10 mg/dL (ref 6–20)
CO2: 25 mmol/L (ref 22–32)
Calcium: 8.9 mg/dL (ref 8.9–10.3)
Chloride: 82 mmol/L — ABNORMAL LOW (ref 98–111)
Creatinine, Ser: 0.43 mg/dL — ABNORMAL LOW (ref 0.44–1.00)
GFR, Estimated: >60 mL/min (ref >60)
Glucose, Bld: 170 mg/dL — ABNORMAL HIGH (ref 70–99)
Potassium: 3.9 mmol/L (ref 3.5–5.1)
Sodium: 116 mmol/L — CL (ref 135–145)
Total Bilirubin: 0.6 mg/dL (ref 0.3–1.2)
Total Protein: 8.1 g/dL (ref 6.5–8.1)

## 2022-06-15 LAB — CBC WITH DIFFERENTIAL/PLATELET
Abs Immature Granulocytes: 0.12 10*3/uL — ABNORMAL HIGH (ref 0.00–0.07)
Basophils Absolute: 0.1 10*3/uL (ref 0.0–0.1)
Basophils Relative: 0 %
Eosinophils Absolute: 0.1 10*3/uL (ref 0.0–0.5)
Eosinophils Relative: 0 %
HCT: 29.3 % — ABNORMAL LOW (ref 36.0–46.0)
Hemoglobin: 10.6 g/dL — ABNORMAL LOW (ref 12.0–15.0)
Immature Granulocytes: 1 %
Lymphocytes Relative: 12 %
Lymphs Abs: 2.5 10*3/uL (ref 0.7–4.0)
MCH: 31.3 pg (ref 26.0–34.0)
MCHC: 36.2 g/dL — ABNORMAL HIGH (ref 30.0–36.0)
MCV: 86.4 fL (ref 80.0–100.0)
Monocytes Absolute: 1.3 10*3/uL — ABNORMAL HIGH (ref 0.1–1.0)
Monocytes Relative: 6 %
Neutro Abs: 16.2 10*3/uL — ABNORMAL HIGH (ref 1.7–7.7)
Neutrophils Relative %: 81 %
Platelets: 746 10*3/uL — ABNORMAL HIGH (ref 150–400)
RBC: 3.39 MIL/uL — ABNORMAL LOW (ref 3.87–5.11)
RDW: 13.3 % (ref 11.5–15.5)
WBC: 20.2 10*3/uL — ABNORMAL HIGH (ref 4.0–10.5)
nRBC: 0 % (ref 0.0–0.2)

## 2022-06-15 LAB — URINALYSIS, ROUTINE W REFLEX MICROSCOPIC
Bacteria, UA: NONE SEEN
Bilirubin Urine: NEGATIVE
Glucose, UA: 50 mg/dL — AB
Hgb urine dipstick: NEGATIVE
Ketones, ur: NEGATIVE mg/dL
Leukocytes,Ua: NEGATIVE
Nitrite: NEGATIVE
Protein, ur: >=300 mg/dL — AB
Specific Gravity, Urine: 1.019 (ref 1.005–1.030)
pH: 6 (ref 5.0–8.0)

## 2022-06-15 LAB — OSMOLALITY: Osmolality: 261 mOsm/kg — ABNORMAL LOW (ref 275–295)

## 2022-06-15 LAB — SODIUM
Sodium: 116 mmol/L — CL (ref 135–145)
Sodium: 118 mmol/L — CL (ref 135–145)
Sodium: 117 mmol/L — CL (ref 135–145)
Sodium: 122 mmol/L — ABNORMAL LOW (ref 135–145)

## 2022-06-15 LAB — CULTURE, BLOOD (ROUTINE X 2)

## 2022-06-15 LAB — SODIUM, URINE, RANDOM: Sodium, Ur: 151 mmol/L

## 2022-06-15 LAB — OSMOLALITY, URINE: Osmolality, Ur: 639 mOsm/kg (ref 300–900)

## 2022-06-15 MED ORDER — FLUDROCORTISONE ACETATE 0.1 MG PO TABS
0.2000 mg | ORAL_TABLET | Freq: Three times a day (TID) | ORAL | Status: DC
  Administered 2022-06-15: 0.2 mg via ORAL
  Filled 2022-06-15 (×4): qty 2

## 2022-06-15 MED ORDER — PHENYLEPHRINE CONCENTRATED 100MG/250ML (0.4 MG/ML) INFUSION SIMPLE
0.0000 ug/min | INTRAVENOUS | Status: DC
  Administered 2022-06-15: 185 ug/min via INTRAVENOUS
  Administered 2022-06-15: 265 ug/min via INTRAVENOUS
  Administered 2022-06-16 (×2): 400 ug/min via INTRAVENOUS
  Administered 2022-06-16: 275 ug/min via INTRAVENOUS
  Administered 2022-06-16 (×2): 400 ug/min via INTRAVENOUS
  Administered 2022-06-17: 395 ug/min via INTRAVENOUS
  Administered 2022-06-17: 385 ug/min via INTRAVENOUS
  Administered 2022-06-17: 400 ug/min via INTRAVENOUS
  Administered 2022-06-17: 385 ug/min via INTRAVENOUS
  Administered 2022-06-17: 345 ug/min via INTRAVENOUS
  Administered 2022-06-18: 355 ug/min via INTRAVENOUS
  Administered 2022-06-18: 325 ug/min via INTRAVENOUS
  Administered 2022-06-18: 240 ug/min via INTRAVENOUS
  Administered 2022-06-19: 140 ug/min via INTRAVENOUS
  Filled 2022-06-15 (×16): qty 250

## 2022-06-15 MED ORDER — SODIUM CHLORIDE 3 % IV SOLN
INTRAVENOUS | Status: DC
  Filled 2022-06-15 (×3): qty 500

## 2022-06-15 MED ORDER — SODIUM CHLORIDE 0.9 % IV SOLN: INTRAVENOUS | Status: DC

## 2022-06-15 MED ORDER — FLUDROCORTISONE ACETATE 0.1 MG PO TABS
0.2000 mg | ORAL_TABLET | Freq: Three times a day (TID) | ORAL | Status: DC
  Administered 2022-06-15 – 2022-06-16 (×5): 0.2 mg
  Filled 2022-06-15 (×6): qty 2

## 2022-06-15 MED ORDER — SODIUM CHLORIDE 0.9 % IV SOLN
3.0000 g | Freq: Four times a day (QID) | INTRAVENOUS | Status: DC
  Administered 2022-06-15 – 2022-06-16 (×4): 3 g via INTRAVENOUS
  Filled 2022-06-15 (×4): qty 8

## 2022-06-15 NOTE — Progress Notes (Signed)
Inpatient Rehab Admissions Coordinator:    CIR following, not medically ready for d/c to CIR at this time.   Megan Salon, MS, CCC-SLP Rehab Admissions Coordinator  [phone] (celll) [phone] (office)

## 2022-06-15 NOTE — Progress Notes (Signed)
Physical Therapy Treatment Patient Details Name: Kristin Simmons MRN: [id number] DOB: [birth year] Today's Date: 06/15/2022   History of Present Illness Pt is a 37yo female who was admitted for elective coiling of R MCA aneurysm however this was complicated by intraoperative rupture resulting in a R pterional craniectomy for clipping of MCA aneurysm. Placement of bone flap in subcutaneous abdominal pocket. PMH: anxiety, anemia, HTN, depression    PT Comments    Patient limited this session with decreased arousal and decreased attention.  Noted changes in meds due to vasospasm and pt pushing a little more to the L with R UE.  Positioning in chair with wedge and rolled linen for head as midline as possible.  Patient able to achieve initial goals so upgraded goals despite limited progress today. PT will continue to follow.   Recommendations for follow up therapy are one component of a multi-disciplinary discharge planning process, led by the attending physician.  Recommendations may be updated based on patient status, additional functional criteria and insurance authorization.  Follow Up Recommendations       Assistance Recommended at Discharge Frequent or constant Supervision/Assistance  Patient can return home with the following Two people to help with walking and/or transfers;A lot of help with bathing/dressing/bathroom;Assistance with cooking/housework;Direct supervision/assist for medications management;Direct supervision/assist for financial management;Assist for transportation;Help with stairs or ramp for entrance   Equipment Recommendations  Other (comment) (TBA)    Recommendations for Other Services       Precautions / Restrictions Precautions Precautions: Fall Precaution Comments: R craniectomy, no bone flap, bone flap placed in abdomen, R wrist restraint, coretrak, flexiseal Restrictions Weight Bearing Restrictions: No     Mobility  Bed Mobility Overal bed mobility: Needs  Assistance Bed Mobility: Rolling, Sidelying to Sit Rolling: Max assist Sidelying to sit: Total assist, +2 for physical assistance       General bed mobility comments: increased assist to roll to the R, patient pushing against OT in order to come up into sitting, total A from PT and OT to come up into sitting, having to reposition RUE to significant weightshifting off of R hip causing increased L lateral lean    Transfers Overall transfer level: Needs assistance Equipment used: 2 person hand held assist Transfers: Bed to chair/wheelchair/BSC   Stand pivot transfers: +2 safety/equipment, +2 physical assistance, Max assist         General transfer comment: patient able to put weight through RLE, however decreased initiation in session noted requiring increased assist to pivot from OT    Ambulation/Gait               General Gait Details: pt unable   Stairs             Wheelchair Mobility    Modified Rankin (Stroke Patients Only) Modified Rankin (Stroke Patients Only) Pre-Morbid Rankin Score: No symptoms Modified Rankin: Severe disability     Balance Overall balance assessment: Needs assistance Sitting-balance support: Feet supported Sitting balance-Leahy Scale: Poor Sitting balance - Comments: assist for head control (PT providing support and gentle stretching) minimal to no activation at trunk when OT attempting to move patient out of BOS to increase activity tolerance Postural control: Left lateral lean Standing balance support: Bilateral upper extremity supported Standing balance-Leahy Scale: Zero Standing balance comment: +2 for standing                            Cognition Arousal/Alertness: Lethargic Behavior During Therapy:  Flat affect Overall Cognitive Status: Impaired/Different from baseline Area of Impairment: Attention, Following commands, Safety/judgement, Problem solving, Awareness, Memory                   Current  Attention Level: Sustained Memory: Decreased short-term memory Following Commands: Follows one step commands with increased time, Follows one step commands consistently     Problem Solving: Slow processing, Decreased initiation, Difficulty sequencing, Requires verbal cues, Requires tactile cues General Comments: patient more lethargic in session, significant cues to alert and respond, more alert when sitting EOB and working on cervical rotation and sitting balance, immediately falling back asleep annd requiring increased cues when transitioning to recliner        Exercises Other Exercises Other Exercises: seated encouraged head press/chin tuck with head supported on therapist's hand to initiate core strength 5 reps x 5 sec hold with minimal participation    General Comments General comments (skin integrity, edema, etc.): RN assisting with lines; patient with limited eye opening, and few verbalizations      Pertinent Vitals/Pain Pain Assessment Pain Assessment: Faces Faces Pain Scale: Hurts whole lot Pain Location: neck and back Pain Descriptors / Indicators: Grimacing, Moaning Pain Intervention(s): Monitored during session, Repositioned, Limited activity within patient's tolerance    Home Living                          Prior Function            PT Goals (current goals can now be found in the care plan section) Acute Rehab PT Goals Patient Stated Goal: to go home PT Goal Formulation: With patient/family Time For Goal Achievement: 06/29/22 Potential to Achieve Goals: Good Progress towards PT goals: Not progressing toward goals - comment    Frequency    Min 4X/week      PT Plan Current plan remains appropriate    Co-evaluation PT/OT/SLP Co-Evaluation/Treatment: Yes Reason for Co-Treatment: Complexity of the patient's impairments (multi-system involvement);Necessary to address cognition/behavior during functional activity;For patient/therapist safety;To  address functional/ADL transfers PT goals addressed during session: Mobility/safety with mobility;Balance        AM-PAC PT "6 Clicks" Mobility   Outcome Measure  Help needed turning from your back to your side while in a flat bed without using bedrails?: Total Help needed moving from lying on your back to sitting on the side of a flat bed without using bedrails?: Total Help needed moving to and from a bed to a chair (including a wheelchair)?: Total Help needed standing up from a chair using your arms (e.g., wheelchair or bedside chair)?: Total Help needed to walk in hospital room?: Total Help needed climbing 3-5 steps with a railing? : Total 6 Click Score: 6    End of Session Equipment Utilized During Treatment: Gait belt Activity Tolerance: Patient limited by lethargy Patient left: in chair;with chair alarm set;with call bell/phone within reach Nurse Communication: Mobility status;Need for lift equipment PT Visit Diagnosis: Unsteadiness on feet (R26.81);Hemiplegia and hemiparesis Hemiplegia - Right/Left: Left Hemiplegia - dominant/non-dominant: Non-dominant Hemiplegia - caused by: Nontraumatic intracerebral hemorrhage     Time: 1506-1530 PT Time Calculation (min) (ACUTE ONLY): 24 min  Charges:  $Therapeutic Activity: 8-22 mins                     Sheran Lawless, PT Acute Rehabilitation Services [phone] 06/15/2022    [name] 06/15/2022, 5:24 PM

## 2022-06-15 NOTE — Progress Notes (Signed)
Pharmacy Antibiotic Note  Kristin Simmons is a 37 y.o. female admitted on 05/31/2022 MCA aneurysm rupture new concern for sepsis.  Pharmacy has been consulted for Unasyn dosing. WBC steadily increasing, unclear source of infection.   Plan: Unasyn 3g Q6H.  Follow culture data for de-escalation.  Monitor renal function for dose adjustments as indicated.   Height: 5\' 4"  (162.6 cm) Weight: 40.6 kg (89 lb 8.1 oz) IBW/kg (Calculated) : 54.7  Temp (24hrs), Avg:98.3 F (36.8 C), Min:97.5 F (36.4 C), Max:99.4 F (37.4 C)  Recent Labs  Lab 06/11/22 0311 06/11/22 1749 06/12/22 0052 06/13/22 0206 06/14/22 0934 06/15/22 0615 06/15/22 0840  WBC 16.2*  --  13.8* 14.3* 17.3* 20.2*  --   CREATININE 0.51 0.58 0.62 0.58 0.54  --  0.43*    Estimated Creatinine Clearance: 63.5 mL/min (A) (by C-G formula based on SCr of 0.43 mg/dL (L)).    Allergies  Allergen Reactions   Latex Itching and Rash    Thank you for allowing pharmacy to be a part of this patient's care.  Estill Batten, PharmD, BCCCP  06/15/2022 10:53 AM

## 2022-06-15 NOTE — Progress Notes (Addendum)
NAME:  Kristin Simmons, MRN:  [id number], DOB:  1985-05-04, LOS: 14 ADMISSION DATE:  05/31/2022, CONSULTATION DATE: 06/13/2022 REFERRING MD: Conchita Paris - NSGY CHIEF COMPLAINT: Vasospasm  History of Present Illness:  37 year old woman who presented for elective right clipping of MCA aneurysm. Experienced intraoperative rupture requiring mechanical ventilation.  Developed MCA territory edema with hemorrhagic transformation.  Eventually extubated and transferred to the floor.  Recently she has developed hyponatremia with sodium as low as 123.  She was treated for haemophilus pneumonia.  Has been improving with increasing periods of wakefulness but with weakness on the left side.  On 4/29, she experienced a change in mental status CT scan showed evidence of severe vasospasm.  She was transferred back to the ICU for hemodynamic augmentation.  Pertinent Medical History:   Past Medical History:  Diagnosis Date   Anemia    Anxiety    Arthritis    Asthma    Cancer (HCC)    cervical   Depression    Headache    Hypertension    She was taking her mother's blood pressure meds but states she doesn't need bp meds now.   Vaginal Pap smear, abnormal    Significant Hospital Events: Including procedures, antibiotic start and stop dates in addition to other pertinent events   4/16 Electively admitted by neurosurgery and underwent an right pterional craniectomy for clipping of MCA aneurysm, complex due to intraoperative rupture and need for temporary MCA occlusion. Postprocedure, remained on ventilator, moved to ICU and was also started on Cardene drip. Patient was seen by [facility] service as a consult for medical management. 4/18 CT head showed increased anterior and middle division R MCA territory edema with hemorrhagic transformation, increased mass effect with 4 mm leftward shift 4/20 Extubated 4/23 TRH was involved in care as a consult for medical management. 4/28 Echocardiogram 70-75% EF, grade 1  diastolic dysfunction, mild LVH  4/29 Change in mental status with vasospasm on CT. PCCM reconsulted for pressors to achieve goal SBP > 180 in the setting of vasospasm, L IJ CVC placed. 4/30 Persistent mental status changes. NE , Neo and vaso added.Nimodipine freq adjusted. Started full liq diet    Interim History / Subjective:  Oriented. A little slow to respond but nursing reports "up all night"    Objective:  Blood pressure (!) 183/103, pulse 87, temperature 99.9 F (37.7 C), temperature source Axillary, resp. rate 20, height 5\' 4"  (1.626 m), weight 48.8 kg, last menstrual period 04/26/2022, SpO2 100 %. CVP:  [6 mmHg-10 mmHg] 8 mmHg      Intake/Output Summary (Last 24 hours) at 06/14/2022 0839 Last data filed at 06/14/2022 0600 Gross per 24 hour  Intake 907.83 ml  Output 2676 ml  Net -1768.17 ml    Filed Weights   06/08/22 0457 06/09/22 0500 06/11/22 0500  Weight: 53.6 kg 57.4 kg 48.8 kg   Physical Examination: General 37 year old female laying in bed. No distress  HENT NCAT crani dressing in place  Pulm clear  Card rrr Abd soft still has liq stools Neuro. Slow to awaken. She is oriented X 4. Excellent strength on right. Left has tone but weak.  Gu yellow urine   Resolved Hospital Problem List:    Assessment & Plan:  Critically ill due to clinical and radiographic vasospasm due to aneurysmal subarachnoid hemorrhage of right MCA aneurysm Evidence of right MCA infarction - Neurosurgery primary - now POD 15 Plan  Triple H therapy for vasospasm Goal SBP > 180,  CVP > 8 Levophed and Neosynephrine titrated to goal SBP + vasopressin Continue nimotop TCDs per protocol Frequent neurochecks/serial neurologic exams Continue Keppra for seizure ppx Na 2gm TID Neuroprotective measures: HOB > 30 degrees, normoglycemia, normothermia, electrolytes WNL Eventually CIR   Severe 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 Plan Ck UNa, uosmo, ua w/ spec grav)  Will need to  add HT replacement   Dysphagia/aspiration risk Plan tubefeeds Thin diet when mental status allows  Leukocytosis  Plan Trend fever curve  Blood cultures sent 4/30 will follow   Best Practice: (right click and "Reselect all SmartList Selections" daily)   Diet/type: tubefeeds DVT prophylaxis: SCD GI prophylaxis: H2B Lines: Central line Foley:  N/A Code Status:  full code Last date of multidisciplinary goals of care discussion [Per primary team]  Critical care time: 32 min   Simonne Martinet ACNP-BC [facility] Pager # [phone] OR # [phone] if no answer

## 2022-06-15 NOTE — Progress Notes (Signed)
Patient has remained on vasopressor for the last more than 24 hours. ICU team is following TRH will sign off. Discussed with ICU attending.

## 2022-06-15 NOTE — Progress Notes (Signed)
NEUROSURGERY PROGRESS NOTE   Pt seen and examined. No issues overnight.   EXAM: Temp:  [97.5 F (36.4 C)-99.4 F (37.4 C)] 97.5 F (36.4 C) (05/01 0800) Pulse Rate:  [58-112] 82 (05/01 0845) Resp:  [15-32] 21 (05/01 0845) BP: (123-226)/(71-140) 174/83 (05/01 0845) SpO2:  [94 %-100 %] 100 % (05/01 0845) Weight:  [40.6 kg] 40.6 kg (05/01 0702) Intake/Output      04/30 0701 05/01 0700 05/01 0701 05/02 0700   I.V. (mL/kg) 3611.6 (74) 175.9 (4.3)   Other 60    NG/GT 1035 45   IV Piggyback 411.3    Total Intake(mL/kg) 5117.9 (104.9) 220.9 (5.4)   Urine (mL/kg/hr) 6300 (5.4)    Emesis/NG output     Stool 240    Total Output 6540    Net -1422.1 +220.9         Awake, alert, mildly confused Pupils 2mm, reactive Conversant with me and SLP at bedside Following commands RUE/RLE, moves spontaneously and purposefully No voluntary movement LUE/LLE Wound c/d/I,   LABS: Lab Results  Component Value Date   CREATININE 0.54 06/14/2022   BUN 11 06/14/2022   NA 133 (L) 06/14/2022   K 4.3 06/14/2022   CL 102 06/14/2022   CO2 23 06/14/2022   Lab Results  Component Value Date   WBC 20.2 (H) 06/15/2022   HGB 10.6 (L) 06/15/2022   HCT 29.3 (L) 06/15/2022   MCV 86.4 06/15/2022   PLT 746 (H) 06/15/2022     TCD: 4/17,rs         Right  Left   41  59   -35  -37   24 26 23  16    47  40   -25  -28   -36       4/19 GC         Right  Left   77  73   -35  -45   11  49   19  19   48  20   *  *   *       4/22  [facility]       139/86  Right  Left    118   83    *   -27   *   19    *   *    *   *    -26   -39    -76        4/24  [facility]        142/101 Right  Left    140   76   -22   -44    45   46    26   26    61   30    *   -30    -47        4/26  [facility]        136/93 Right  Left    177   57    -50   -43    47   23    22   24     59   45    -18   -25    -45        4/29 GC         Right  Left    125   100    -14   -73     14   -24    26   15  35   20    -31   -37    -41     IMPRESSION: - 37 y.o. female POD#15 with intraoperative aneurysm rupture during opening. Exam appears stable - VAP - Hyponatermia improving - ? Vasospasm involving proximal right M1/distal ICA. Exam seems improved with hyperdynamic therapy - Increasing leukocytosis  PLAN: - Will cont with SBP goal > for now. - Cont Keppra - Continue Nimotop - Cultures pending,    Lisbeth Renshaw, MD [facility]

## 2022-06-15 NOTE — Progress Notes (Signed)
More awake this afternoon. Does report HA.  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

## 2022-06-15 NOTE — Progress Notes (Signed)
Transcranial Doppler   Date POD PCO2 HCT BP   MCA ACA PCA OPHT SIPH VERT Basilar  4/17,rs         Right  Left   41  59   -35  -37   24 26 23  16    47  40   -25  -28   -36       4/19 GC         Right  Left   77  73   -35  -45   11  49   19  19   48  20   *  *   *       4/22  [facility]       139/86  Right  Left    118   83    *   -27   *   19    *   *    *   *    -26   -39    -76        4/24  RH        142/101 Right  Left    140   76   -22   -44    45   46    26   26    61   30    *   -30    -47        4/26  RH        136/93 Right  Left    177   57    -50   -43    47   23    22   24     59   45    -18   -25    -45        4/29 GC         Right  Left    125   100    -14   -73    14   -24    26   15     35   20    -31   -37    -41        5/1 RH       205/98  Right  Left    165   55    -30   -33    34   24    19   20     44   38    -38   -20    -35         MCA = Middle Cerebral Artery      OPHT = Opthalmic Artery     BASILAR = Basilar Artery   ACA = Anterior Cerebral Artery     SIPH = Carotid Siphon PCA = Posterior Cerebral Artery   VERT = Verterbral Artery                    Normal MCA = 62+\-12 ACA = 50+\-12 PCA = 42+\-23    * - Unable to insonate   Lindegaard Ratio - Right = 8.25 , Left = Unavailable, LT ICA bandaging   06/15/22 10:40 AM Jean Rosenthal, RDMS, RVT

## 2022-06-15 NOTE — Progress Notes (Signed)
Occupational Therapy Treatment Patient Details Name: Kristin Simmons MRN: [id number] DOB: 04/18/1985 Today's Date: 06/15/2022   History of present illness Pt is a 37yo female who was admitted for elective coiling of R MCA aneurysm however this was complicated by intraoperative rupture resulting in a R pterional craniectomy for clipping of MCA aneurysm. Placement of bone flap in subcutaneous abdominal pocket. PMH: anxiety, anemia, HTN, depression   OT comments  Patient more lethargic in session, only yelling out with bed mobility and when OT and PT attempting to provide cervical ROM and stretching. Patient with increased assist to roll to the R, patient pushing against OT in order to come up into sitting, total A from PT and OT to come up into sitting, having to reposition RUE to significant weightshifting off of R hip causing increased L lateral lean. Patient able to put weight through RLE, however decreased initiation in session noted requiring increased assist to pivot from OT (max A of 2 for squat/stand pivot) OT updating goals to reflect current level of progress, OT recommendation remains appropriate.      Recommendations for follow up therapy are one component of a multi-disciplinary discharge planning process, led by the attending physician.  Recommendations may be updated based on patient status, additional functional criteria and insurance authorization.    Assistance Recommended at Discharge Frequent or constant Supervision/Assistance  Patient can return home with the following  Two people to help with walking and/or transfers;Assistance with cooking/housework;Assistance with feeding;Direct supervision/assist for medications management;Direct supervision/assist for financial management;Assist for transportation;Help with stairs or ramp for entrance;A lot of help with bathing/dressing/bathroom   Equipment Recommendations  Other (comment) (will continue to assess)    Recommendations for  Other Services      Precautions / Restrictions Precautions Precautions: Fall Precaution Comments: R craniectomy, no bone flap, bone flap placed in abdomen, R wrist restraint, coretrak, flexiseal Restrictions Weight Bearing Restrictions: No       Mobility Bed Mobility Overal bed mobility: Needs Assistance Bed Mobility: Rolling, Sidelying to Sit Rolling: Max assist Sidelying to sit: Total assist, +2 for physical assistance       General bed mobility comments: increased assist to roll to the R, patient pushing against OT in order to come up into sitting, total A from PT and OT to come up into sitting, having to reposition RUE to significant weightshifting off of R hip causing increased L lateral lean    Transfers Overall transfer level: Needs assistance Equipment used: 2 person hand held assist Transfers: Bed to chair/wheelchair/BSC   Stand pivot transfers: +2 safety/equipment, +2 physical assistance, Max assist         General transfer comment: patient able to put weight through RLE, however decreased initiation in session noted requiring increased assist to pivot from OT     Balance Overall balance assessment: Needs assistance Sitting-balance support: Feet supported Sitting balance-Leahy Scale: Poor Sitting balance - Comments: assist for head control (PT providing support and gentle stretching) minimal to no activation at trunk when OT attempting to move patient out of BOS to increase activity tolerance Postural control: Left lateral lean Standing balance support: Bilateral upper extremity supported Standing balance-Leahy Scale: Zero Standing balance comment: +2 for standing                           ADL either performed or assessed with clinical judgement   ADL Overall ADL's : Needs assistance/impaired Eating/Feeding: Set up  Toilet Transfer: Maximal assistance;+2 for physical assistance;+2 for  safety/equipment;Stand-pivot Toilet Transfer Details (indicate cue type and reason): Max A of 2 to transition to chair, able to assist with R leg minimally Toileting- Clothing Manipulation and Hygiene: Total assistance Toileting - Clothing Manipulation Details (indicate cue type and reason): rectal tube and foley currently       General ADL Comments: Session focus on sitting EOB, following commands, and increasing overall activity tolerance. Patient noted to be more lethargic in session to date    Extremity/Trunk Assessment              Vision   Additional Comments: Minimal crossing of midline to the left x2 with max cues and tactile cues to attempt   Perception     Praxis      Cognition Arousal/Alertness: Lethargic Behavior During Therapy: Flat affect Overall Cognitive Status: Impaired/Different from baseline Area of Impairment: Attention, Following commands, Safety/judgement, Problem solving, Awareness, Memory                   Current Attention Level: Sustained Memory: Decreased short-term memory Following Commands: Follows one step commands with increased time, Follows one step commands consistently Safety/Judgement: Decreased awareness of safety, Decreased awareness of deficits Awareness: Intellectual Problem Solving: Slow processing, Decreased initiation, Difficulty sequencing, Requires verbal cues, Requires tactile cues General Comments: patient more lethargic in session, significant cues to alert and respond, more alert when sitting EOB and working on cervical rotation and sitting balance, immediately falling back asleep annd requiring increased cues when transitioning to recliner        Exercises      Shoulder Instructions       General Comments      Pertinent Vitals/ Pain       Pain Assessment Pain Assessment: Faces Faces Pain Scale: Hurts whole lot Pain Location: neck and back Pain Descriptors / Indicators: Grimacing, Moaning Pain  Intervention(s): Limited activity within patient's tolerance, Monitored during session, Repositioned  Home Living                                          Prior Functioning/Environment              Frequency  Min 2X/week        Progress Toward Goals  OT Goals(current goals can now be found in the care plan section)  Progress towards OT goals: Progressing toward goals  Acute Rehab OT Goals Patient Stated Goal: unable to state OT Goal Formulation: Patient unable to participate in goal setting Time For Goal Achievement: 06/29/22 Potential to Achieve Goals: Good ADL Goals Pt Will Perform Lower Body Bathing: sitting/lateral leans;sit to/from stand;with mod assist Pt Will Perform Lower Body Dressing: sitting/lateral leans;sit to/from stand;with mod assist Pt Will Transfer to Toilet: with mod assist;stand pivot transfer;bedside commode Pt Will Perform Toileting - Clothing Manipulation and hygiene: with mod assist;sitting/lateral leans;sit to/from stand Additional ADL Goal #1: Patient will be able to follow 75% of commands consistently as a precursor to higher level cognitive tasks and ADL participation. Additional ADL Goal #2: Patient will be able to complete bed mobility at mod A level as a precursor to OOB activities.  Plan Discharge plan remains appropriate    Co-evaluation      Reason for Co-Treatment: Complexity of the patient's impairments (multi-system involvement);Necessary to address cognition/behavior during functional activity;For patient/therapist safety;To address functional/ADL transfers  AM-PAC OT "6 Clicks" Daily Activity     Outcome Measure   Help from another person eating meals?: Total Help from another person taking care of personal grooming?: A Little Help from another person toileting, which includes using toliet, bedpan, or urinal?: Total Help from another person bathing (including washing, rinsing, drying)?: A Lot Help  from another person to put on and taking off regular upper body clothing?: A Lot Help from another person to put on and taking off regular lower body clothing?: Total 6 Click Score: 10    End of Session Equipment Utilized During Treatment: Gait belt  OT Visit Diagnosis: Muscle weakness (generalized) (M62.81);Other abnormalities of gait and mobility (R26.89);Unsteadiness on feet (R26.81);Other symptoms and signs involving cognitive function;Hemiplegia and hemiparesis Hemiplegia - Right/Left: Left Hemiplegia - dominant/non-dominant: Non-Dominant Hemiplegia - caused by: Other cerebrovascular disease   Activity Tolerance Patient limited by fatigue;Patient limited by lethargy;Patient limited by pain   Patient Left in chair;with call bell/phone within reach;with chair alarm set;with nursing/sitter in room   Nurse Communication Mobility status        Time: 8413-2440 OT Time Calculation (min): 24 min  Charges: OT General Charges $OT Visit: 1 Visit OT Treatments $Self Care/Home Management : 8-22 mins  Pollyann Glen E. , OTR/L Acute Rehabilitation Services [phone]   Cherlyn Cushing 06/15/2022, 4:13 PM

## 2022-06-15 NOTE — Progress Notes (Signed)
Spoke with Dr. Merrily Pew regarding pt SBP goals and needing to increase pressors today. Verbal order to stop increasing pressors as long as pt neuro exam remains the same with little to no change of BP with the increase of meds.

## 2022-06-16 LAB — COMPREHENSIVE METABOLIC PANEL
ALT: 97 U/L — ABNORMAL HIGH (ref 0–44)
AST: 39 U/L (ref 15–41)
Albumin: 2.9 g/dL — ABNORMAL LOW (ref 3.5–5.0)
Alkaline Phosphatase: 86 U/L (ref 38–126)
Anion gap: 9 (ref 5–15)
BUN: 7 mg/dL (ref 6–20)
CO2: 25 mmol/L (ref 22–32)
Calcium: 7.9 mg/dL — ABNORMAL LOW (ref 8.9–10.3)
Chloride: 92 mmol/L — ABNORMAL LOW (ref 98–111)
Creatinine, Ser: 0.39 mg/dL — ABNORMAL LOW (ref 0.44–1.00)
GFR, Estimated: >60 mL/min (ref >60)
Glucose, Bld: 166 mg/dL — ABNORMAL HIGH (ref 70–99)
Potassium: 3.1 mmol/L — ABNORMAL LOW (ref 3.5–5.1)
Sodium: 126 mmol/L — ABNORMAL LOW (ref 135–145)
Total Bilirubin: 0.3 mg/dL (ref 0.3–1.2)
Total Protein: 7.2 g/dL (ref 6.5–8.1)

## 2022-06-16 LAB — URINALYSIS, ROUTINE W REFLEX MICROSCOPIC
Bilirubin Urine: NEGATIVE
Glucose, UA: 150 mg/dL — AB
Hgb urine dipstick: NEGATIVE
Ketones, ur: NEGATIVE mg/dL
Leukocytes,Ua: NEGATIVE
Nitrite: NEGATIVE
Protein, ur: NEGATIVE mg/dL
Specific Gravity, Urine: 1.013 (ref 1.005–1.030)
pH: 6 (ref 5.0–8.0)

## 2022-06-16 LAB — PROCALCITONIN: Procalcitonin: 0.31 ng/mL

## 2022-06-16 LAB — CBC WITH DIFFERENTIAL/PLATELET
Abs Immature Granulocytes: 0.09 10*3/uL — ABNORMAL HIGH (ref 0.00–0.07)
Basophils Absolute: 0.1 10*3/uL (ref 0.0–0.1)
Basophils Relative: 0 %
Eosinophils Absolute: 0.1 10*3/uL (ref 0.0–0.5)
Eosinophils Relative: 1 %
HCT: 29.1 % — ABNORMAL LOW (ref 36.0–46.0)
Hemoglobin: 10.7 g/dL — ABNORMAL LOW (ref 12.0–15.0)
Immature Granulocytes: 1 %
Lymphocytes Relative: 11 %
Lymphs Abs: 1.9 10*3/uL (ref 0.7–4.0)
MCH: 31.5 pg (ref 26.0–34.0)
MCHC: 36.8 g/dL — ABNORMAL HIGH (ref 30.0–36.0)
MCV: 85.6 fL (ref 80.0–100.0)
Monocytes Absolute: 0.8 10*3/uL (ref 0.1–1.0)
Monocytes Relative: 5 %
Neutro Abs: 14.1 10*3/uL — ABNORMAL HIGH (ref 1.7–7.7)
Neutrophils Relative %: 82 %
Platelets: 777 10*3/uL — ABNORMAL HIGH (ref 150–400)
RBC: 3.4 MIL/uL — ABNORMAL LOW (ref 3.87–5.11)
RDW: 13.3 % (ref 11.5–15.5)
WBC: 17.2 10*3/uL — ABNORMAL HIGH (ref 4.0–10.5)
nRBC: 0 % (ref 0.0–0.2)

## 2022-06-16 LAB — SODIUM
Sodium: 122 mmol/L — ABNORMAL LOW (ref 135–145)
Sodium: 132 mmol/L — ABNORMAL LOW (ref 135–145)
Sodium: 134 mmol/L — ABNORMAL LOW (ref 135–145)
Sodium: 135 mmol/L (ref 135–145)
Sodium: 135 mmol/L (ref 135–145)
Sodium: 136 mmol/L (ref 135–145)
Sodium: 136 mmol/L (ref 135–145)
Sodium: 137 mmol/L (ref 135–145)
Sodium: 137 mmol/L (ref 135–145)

## 2022-06-16 LAB — OSMOLALITY, URINE: Osmolality, Ur: 500 mOsm/kg (ref 300–900)

## 2022-06-16 LAB — CULTURE, BLOOD (ROUTINE X 2): Special Requests: ADEQUATE

## 2022-06-16 LAB — SODIUM, URINE, RANDOM: Sodium, Ur: 162 mmol/L

## 2022-06-16 LAB — OSMOLALITY: Osmolality: 268 mOsm/kg — ABNORMAL LOW (ref 275–295)

## 2022-06-16 MED ORDER — SODIUM CHLORIDE 0.9 % IV SOLN: INTRAVENOUS | Status: DC

## 2022-06-16 MED ORDER — SODIUM CHLORIDE 0.9 % IV BOLUS
1000.0000 mL | Freq: Once | INTRAVENOUS | Status: AC
  Administered 2022-06-16: 1000 mL via INTRAVENOUS

## 2022-06-16 MED ORDER — OSMOLITE 1.5 CAL PO LIQD
1000.0000 mL | ORAL | Status: DC
  Administered 2022-06-16 – 2022-06-20 (×6): 1000 mL

## 2022-06-16 MED ORDER — POTASSIUM CHLORIDE 10 MEQ/50ML IV SOLN
10.0000 meq | INTRAVENOUS | Status: AC
  Administered 2022-06-16 (×4): 10 meq via INTRAVENOUS
  Filled 2022-06-16 (×4): qty 50

## 2022-06-16 MED ORDER — SODIUM CHLORIDE 0.9 % IV BOLUS
500.0000 mL | Freq: Once | INTRAVENOUS | Status: AC
  Administered 2022-06-16: 500 mL via INTRAVENOUS

## 2022-06-16 MED ORDER — SODIUM CHLORIDE 0.9 % IV SOLN
3.0000 g | Freq: Four times a day (QID) | INTRAVENOUS | Status: AC
  Administered 2022-06-16 – 2022-06-20 (×16): 3 g via INTRAVENOUS
  Filled 2022-06-16 (×16): qty 8

## 2022-06-16 MED ORDER — POTASSIUM CHLORIDE 20 MEQ PO PACK
20.0000 meq | PACK | ORAL | Status: AC
  Administered 2022-06-16 (×2): 20 meq
  Filled 2022-06-16 (×2): qty 1

## 2022-06-16 NOTE — Progress Notes (Signed)
[facility] ADULT ICU REPLACEMENT PROTOCOL   The patient does apply for the [facility] Adult ICU Electrolyte Replacment Protocol based on the criteria listed below:   1.Exclusion criteria: TCTS, ECMO, Dialysis, and Myasthenia Gravis patients 2. Is GFR >/= 30 ml/min? Yes.    [name] GFR today is >60 3. Is SCr </= 2? Yes.   [name] SCr is 0.39 mg/dL 4. Did SCr increase >/= 0.5 in 24 hours? No. 5.Pt's weight >40kg  Yes.   6. Abnormal electrolyte(s): K+ 3.1  7. Electrolytes replaced per protocol 8.  Call MD STAT for K+ </= 2.5, Phos </= 1, or Mag </= 1 Physician:  Lucile Shutters [facility] 06/16/2022 5:09 AM

## 2022-06-16 NOTE — Progress Notes (Signed)
NAME:  Kristin Simmons, MRN:  [id number], DOB:  Jan 30, 1986, LOS: 14 ADMISSION DATE:  05/31/2022, CONSULTATION DATE: 06/13/2022 REFERRING MD: Conchita Paris - NSGY CHIEF COMPLAINT: Vasospasm  History of Present Illness:  37 year old woman who presented for elective right clipping of MCA aneurysm. Experienced intraoperative rupture requiring mechanical ventilation.  Developed MCA territory edema with hemorrhagic transformation.  Eventually extubated and transferred to the floor.  Recently she has developed hyponatremia with sodium as low as 123.  She was treated for haemophilus pneumonia.  Has been improving with increasing periods of wakefulness but with weakness on the left side.  On 4/29, she experienced a change in mental status CT scan showed evidence of severe vasospasm.  She was transferred back to the ICU for hemodynamic augmentation.  Pertinent Medical History:   Past Medical History:  Diagnosis Date   Anemia    Anxiety    Arthritis    Asthma    Cancer (HCC)    cervical   Depression    Headache    Hypertension    She was taking her mother's blood pressure meds but states she doesn't need bp meds now.   Vaginal Pap smear, abnormal    Significant Hospital Events: Including procedures, antibiotic start and stop dates in addition to other pertinent events   4/16 Electively admitted by neurosurgery and underwent an right pterional craniectomy for clipping of MCA aneurysm, complex due to intraoperative rupture and need for temporary MCA occlusion. Postprocedure, remained on ventilator, moved to ICU and was also started on Cardene drip. Patient was seen by [facility] service as a consult for medical management. 4/18 CT head showed increased anterior and middle division R MCA territory edema with hemorrhagic transformation, increased mass effect with 4 mm leftward shift 4/20 Extubated 4/23 [facility] was involved in care as a consult for medical management. 4/28 Echocardiogram 70-75% EF, grade 1  diastolic dysfunction, mild LVH  4/29 Change in mental status with vasospasm on CT. PCCM reconsulted for pressors to achieve goal SBP > 180 in the setting of vasospasm, L IJ CVC placed. 4/30 Persistent mental status changes. NE , Neo and vaso added.Nimodipine freq adjusted. Started full liq diet  5/1 started 3% NaCl for CSW.  5/2 over evening hours starting diuresing large volumes of urine. Received several boluses NS. Na 132 on 5//2. (acutely down to 116 5/1 from 133 day prior). 3% NaCl reduced   Interim History / Subjective:  Sleepy this am. C/w exam yesterday     Objective:  Blood pressure (!) 183/103, pulse 87, temperature 99.9 F (37.7 C), temperature source Axillary, resp. rate 20, height 5\' 4"  (1.626 m), weight 48.8 kg, last menstrual period 04/26/2022, SpO2 100 %. CVP:  [6 mmHg-10 mmHg] 8 mmHg      Intake/Output Summary (Last 24 hours) at 06/14/2022 0839 Last data filed at 06/14/2022 0600 Gross per 24 hour  Intake 907.83 ml  Output 2676 ml  Net -1768.17 ml    Filed Weights   06/08/22 0457 06/09/22 0500 06/11/22 0500  Weight: 53.6 kg 57.4 kg 48.8 kg   Physical Examination: General 37 year old female resting in bed. Slow to respond again this am but similar to yesterdays exam HENT crani staples in place. Has some dependent swelling at staple site. PERRL Neuro awake. Oriented after repeated requests. She is slow to respond. 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 Now up to 132  Plan Dec HT saline to 30cc/hr Avoid volume depletion Cont florinef Cont close obs of na Na 2gm TID VT  Hypokalemia Plan Replace and recheck   Dysphagia/aspiration risk Plan tubefeeds Thin diet when mental status allows (at this point MS fluctuates still)  Leukocytosis  Wbc stable Plan Trend fever curve  Blood cultures sent 4/30 still pending  Best Practice: (right click and "Reselect all SmartList Selections" daily)   Diet/type: tubefeeds DVT prophylaxis: SCD GI prophylaxis: H2B Lines: Central line Foley:  N/A Code Status:  full code Last date of multidisciplinary goals of care discussion [Per primary team]  Critical care time: 33 min   Simonne Martinet ACNP-BC [facility] Pulmonary/Critical Care Pager # [phone] OR # [phone] if no answer

## 2022-06-16 NOTE — Progress Notes (Signed)
Nutrition Follow-up  DOCUMENTATION CODES:   Not applicable  INTERVENTION:   Recommend continue TF until diet advanced and pt able to meet >60% of her needs PO   PEPuP candidate  Tube feeding via Cortrak tube: Increase Osmolite 1.5 to 55 ml/hr (1320 ml per day) Prosource TF20 60 ml daily  Provides 2060 kcal, 102 gm protein, 1003 ml free water daily  Continue 100 mg thiamine daily per tube and 1 mg folic acid daily per tube   Continue Banatrol Pt with diarrhea due to nimotop   Pt now allowed full liquids Will monitor PO intake and make adjustments to TF as needed.   NUTRITION DIAGNOSIS:   Inadequate oral intake related to inability to eat as evidenced by NPO status. Ongoing.   GOAL:   Patient will meet greater than or equal to 90% of their needs Met with TF at goal  MONITOR:   PO intake, TF tolerance  REASON FOR ASSESSMENT:   Consult Enteral/tube feeding initiation and management  ASSESSMENT:   Pt with PMH of current smoker, ETOH use - drinks 2 beers per day, anemia, HTN, anxiety, asthma, cervial cancer, depression, and headaches admitted with R carotid aneurysm s/p coil embolization. 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   Pt with change in mental status per CT pt with evidence of severe vasospasm.  CCM following again due to severe vasospasm now on pressors for SBP >160   04/16 - s/p craniectomy and clipping of R MCA aneurysm  04/17 - s/p cortrak tube placement; per xray tip in body/antral region of the stomach  04/20 - extubated 04/24 - cortrak replaced; tip in gastric antrum per xray  04/29 - severe vasospasm  05/01 - started 3% for CSW 05/02 - dumping urine   Medications reviewed and include: banatrol, folic acid, lactinex TID, keppra, nimotop, thiamine  Levophed @ 50 mcg Neo-synephrine @ 400 mcg  Vasopressin off  Labs reviewed: Na 137 K 3.1 WBD 17.2  UOP: 8175 ml   Admission weight:  57.4 kg Current weight: 40.6 kg   Diet Order:   Diet Order             Diet full liquid Room service appropriate? Yes; Fluid consistency: Thin  Diet effective now                   EDUCATION NEEDS:   Not appropriate for education at this time  Skin:  Skin Assessment: Skin Integrity Issues: Skin Integrity Issues:: Incisions Incisions: head and abd  Last BM:  140 ml via FMS; pt on nimotop per tube  Height:   Ht Readings from Last 1 Encounters:  05/31/22 5\' 4"  (1.626 m)    Weight:   Wt Readings from Last 1 Encounters:  06/15/22 40.6 kg    BMI:  Body mass index is 15.36 kg/m.  Estimated Nutritional Needs:   Kcal:  1800-2000  Protein:  85-100 grams  Fluid:  > 1.8 L/day  Cammy Copa., RD, LDN, CNSC See AMiON for contact information

## 2022-06-16 NOTE — Progress Notes (Signed)
PT Cancellation Note  Patient Details Name: DESTA BUJAK MRN: [id number] DOB: 25-Sep-1985   Cancelled Treatment:    Reason Eval/Treat Not Completed: Medical issues which prohibited therapy; RN reports active salt wasting with high pressor needs.  Will check back another day.   [name] 06/16/2022, 1:27 PM Sheran Lawless, PT Acute Rehabilitation Services Office:[phone] 06/16/2022

## 2022-06-16 NOTE — Progress Notes (Addendum)
[name] urine output was 1.8 liters in approximately two hours. CVP 2 at this time. CCM notified. Orders obtained.   1230  -- [name] CVP was 3 at this time. CCM notified and ordered 100cc/hr NS to be administered.   1700 -- [name] CVP remains at a 1 with a goal of 8. CCM notified. No new orders. Neuro status remains the same.

## 2022-06-16 NOTE — Progress Notes (Signed)
Speech Language Pathology Treatment: Dysphagia  Patient Details Name: Kristin Simmons MRN: [id number] DOB: 1985/11/15 Today's Date: 06/16/2022 Time: 1324-4010 SLP Time Calculation (min) (ACUTE ONLY): 10 min  Assessment / Plan / Recommendation Clinical Impression  Kristin Simmons was talkative and interactive this am.  Accepted sips of water from a straw and bites of applesauce with improved oral control, less left sided residue, improved overall labial seal, no s/s of aspiration. She showed improved head support and neck mobility today, allowing her to hold her head midline while eating. Continue with full liquid diet for now- doubt instrumental swallow study will be needed. SLP will follow. D/W RN.  HPI   Pt is a 37 y/o female was 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 She remained on vent post op. ETT 4/16-4/20. Placement of bone flap in subcutaneous abdominal pocket. PMH: anxiety, anemia, HTN, depression.      SLP Plan  Continue with current plan of care      Recommendations for follow up therapy are one component of a multi-disciplinary discharge planning process, led by the attending physician.  Recommendations may be updated based on patient status, additional functional criteria and insurance authorization.    Recommendations  Diet recommendations:  (full liquids) Liquids provided via: Cup;Straw Medication Administration: Via alternative means Supervision: Full supervision/cueing for compensatory strategies;Staff to  assist with self feeding                  Oral care prior to ice chip/H20;Oral care QID   Frequent or constant Supervision/Assistance Dysphagia, oropharyngeal phase (R13.12)     Continue with current plan of care     L. Samson Frederic, MA CCC/SLP Clinical Specialist - Acute Care SLP Acute Rehabilitation Services Office number [phone]  Kristin Simmons Kristin Simmons  06/16/2022, 10:58 AM

## 2022-06-16 NOTE — Progress Notes (Addendum)
eLink Physician-Brief Progress Note Patient Name: Kristin Simmons DOB: 02-Dec-1985 MRN: [id number]   Date of Service  06/16/2022  HPI/Events of Note  Notified that patient has had increase in urine output in last 3 hours and made 1400 cc . Had made around 1600 cc in day shift and urine output has picked up only in last few hours. Odd presentation. Is on 3% saline at 100 cc/hour and is also on 3 pressors to maintain Bps. I am told thre is no change in neuro exam. Neurosurgery was contacted earlier about some swelling at cranial site and did not think acute intervention was needed. Day team picture notes that there could be a mixed picture of siadh as well as Cerebral salt wasting. 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 It is also possible that 3% saline is causing excessive loss of free water but sodium is only 122 at this time.  eICU Interventions  Since hypotensive and CVP also low, will give NS 1 liter over 1 hour Check urine analysis, osm and sodium, along with serum osm  Repeat sodium planned at 4 am  If changes occur in neuro status then would notify the surg team Patient can have multiple sodium related issues at the same time with underlying condition, so will continue serial checks  Inform urine output in next 2 hours D.w RN      Intervention Category Major Interventions: Electrolyte abnormality - evaluation and management   G  06/16/2022, 1:21 AM  Addendum at 3:50 am - retrospective entry. Continued polyuria. Urine sodium 162, specific gravity 1.013 and still with a CVP of 6. Repeat NS bolus. Urine and serum osm pending. Asked RN also to send full chemistry now to lab. Is on 3% saline. Would expect DI to cause a low urine sodium. Salt wasting itself can also cause polyuria and hypovolemia.   Addendum at 5:15 am - Sodium 126. Corrected 10 meq so far but the earlier drop in sodium was  also very rapid. She is fully intact neurologically. Urine and serum osm also noted. Difficult to interpret while on 3% saline. Continues with low CVP as well as polyuria. Sodium also high in urine. These go against DI. Vasopressin started on April 30 in the afternoon. Unclear If that has caused issues or contributed to them. Is on Levo and phenylephrine as well. Discussed case with Dr Francine Graven in detail. Will hold vaso, drop 3% saline to 50 cc/hour, give 1 liter NS over 2 hours in trying to match her urine output and then see how she does. Have asked RN to update neurosurg as well to get a defined BP goal. Defer need for repeat imaging to them. In addition, will change sodium checks to every 2 hours now that we expect urine output to also change. Placed orders to start at 7 am. May also consider a renal consult to help with management of this complicated case

## 2022-06-16 NOTE — Progress Notes (Signed)
NEUROSURGERY PROGRESS NOTE   Pt seen and examined. No issues overnight.   EXAM: Temp:  [98 F (36.7 C)-98.6 F (37 C)] 98.2 F (36.8 C) (05/02 0400) Pulse Rate:  [77-123] 105 (05/02 0715) Resp:  [16-31] 24 (05/02 0715) BP: (148-209)/(68-133) 156/80 (05/02 0715) SpO2:  [97 %-100 %] 98 % (05/02 0715) Intake/Output      05/01 0701 05/02 0700 05/02 0701 05/03 0700   I.V. (mL/kg) 4358.7 (107.4)    Other     NG/GT 1290    IV Piggyback 3106.5    Total Intake(mL/kg) 8755.2 (215.6)    Urine (mL/kg/hr) 8175 (8.4)    Stool 140    Total Output 8315    Net +440.2          Somewhat sleepy this am, but was awake and alert earlier this am for RN Pupils 2mm, reactive Answers questions appropriately Following commands RUE/RLE, moves spontaneously and purposefully No voluntary movement LUE/LLE, w/d LUE/LLE Wound c/d/I,   LABS: Lab Results  Component Value Date   CREATININE 0.39 (L) 06/16/2022   BUN 7 06/16/2022   NA 132 (L) 06/16/2022   K 3.1 (L) 06/16/2022   CL 92 (L) 06/16/2022   CO2 25 06/16/2022   Lab Results  Component Value Date   WBC 17.2 (H) 06/16/2022   HGB 10.7 (L) 06/16/2022   HCT 29.1 (L) 06/16/2022   MCV 85.6 06/16/2022   PLT 777 (H) 06/16/2022     TCD: Date POD PCO2 HCT BP   MCA ACA PCA OPHT SIPH VERT Basilar  4/17,rs         Right  Left   41  59   -35  -37   24 26 23  16    47  40   -25  -28   -36       4/19 GC         Right  Left   77  73   -35  -45   11  49   19  19   48  20   *  *   *       4/22  [facility]       139/86  Right  Left    118   83    *   -27   *   19    *   *    *   *    -26   -39    -76        4/24  [facility]        142/101 Right  Left    140   76   -22   -44    45   46    26   26    61   30    *   -30    -47        4/26  RH        136/93 Right  Left    177   57    -50   -43    47   23    22   24     59   45    -18   -25    -45        4/29 GC         Right  Left    125    100    -14   -73    14   -  24    26   15     35   20    -31   -37    -41        5/1 RH       205/98  Right  Left    165   55    -30   -33    34   24    19   20     44   38    -38   -20    -35        IMPRESSION: - 37 y.o. female POD#16 with intraoperative aneurysm rupture during opening. Exam appears stable - VAP - Hyponatermia likely mixed picture. Appears euvolemic - ? Vasospasm involving proximal right M1/distal ICA. Exam seems stable with decreased SBP in the 160s.  PLAN: - Will cont with SBP goal of for now and monitor exam. Can decrease pressor requirement if neurologic exam remains stable. - Cont Keppra - Continue Nimotop - Cultures pending, negative thus far. On empiric abx per PCCM - Cont hypertonic saline, monitor serum Na closely.   Lisbeth Renshaw, MD [facility]

## 2022-06-16 NOTE — Progress Notes (Signed)
   06/15/22 2200  Provider Notification  Provider Name/Title Dr. Lovell Sheehan Neurosurgery  Date Provider Notified 06/15/22  Time Provider Notified 2220  Method of Notification Page  Notification Reason Change in status ([name] crani site swelling, was soft and full at shift change, now firm)  Provider response No new orders (As long at patient is still neuro intact, no new orders per Dr. Lovell Sheehan)  Date of Provider Response 06/15/22  Time of Provider Response [phone]

## 2022-06-17 ENCOUNTER — Inpatient Hospital Stay (HOSPITAL_COMMUNITY): Payer: Medicaid Other

## 2022-06-17 DIAGNOSIS — I609 Nontraumatic subarachnoid hemorrhage, unspecified: Secondary | ICD-10-CM

## 2022-06-17 LAB — SODIUM
Sodium: 137 mmol/L (ref 135–145)
Sodium: 138 mmol/L (ref 135–145)
Sodium: 136 mmol/L (ref 135–145)
Sodium: 138 mmol/L (ref 135–145)
Sodium: 139 mmol/L (ref 135–145)
Sodium: 137 mmol/L (ref 135–145)
Sodium: 139 mmol/L (ref 135–145)
Sodium: 136 mmol/L (ref 135–145)
Sodium: 138 mmol/L (ref 135–145)

## 2022-06-17 LAB — BASIC METABOLIC PANEL
Anion gap: 13 (ref 5–15)
Anion gap: 12 (ref 5–15)
BUN: 6 mg/dL (ref 6–20)
BUN: 9 mg/dL (ref 6–20)
CO2: 24 mmol/L (ref 22–32)
CO2: 29 mmol/L (ref 22–32)
Calcium: 7.7 mg/dL — ABNORMAL LOW (ref 8.9–10.3)
Calcium: 9.1 mg/dL (ref 8.9–10.3)
Chloride: 101 mmol/L (ref 98–111)
Chloride: 96 mmol/L — ABNORMAL LOW (ref 98–111)
Creatinine, Ser: 0.34 mg/dL — ABNORMAL LOW (ref 0.44–1.00)
Creatinine, Ser: 0.41 mg/dL — ABNORMAL LOW (ref 0.44–1.00)
GFR, Estimated: >60 mL/min (ref >60)
GFR, Estimated: >60 mL/min (ref >60)
Glucose, Bld: 183 mg/dL — ABNORMAL HIGH (ref 70–99)
Glucose, Bld: 133 mg/dL — ABNORMAL HIGH (ref 70–99)
Potassium: 2.5 mmol/L — CL (ref 3.5–5.1)
Potassium: 3.1 mmol/L — ABNORMAL LOW (ref 3.5–5.1)
Sodium: 138 mmol/L (ref 135–145)
Sodium: 137 mmol/L (ref 135–145)

## 2022-06-17 LAB — CULTURE, BLOOD (ROUTINE X 2)
Culture: NO GROWTH
Special Requests: ADEQUATE

## 2022-06-17 MED ORDER — POTASSIUM CHLORIDE 20 MEQ PO PACK
40.0000 meq | PACK | ORAL | Status: AC
  Administered 2022-06-17 – 2022-06-18 (×2): 40 meq
  Filled 2022-06-17 (×2): qty 2

## 2022-06-17 MED ORDER — SODIUM CHLORIDE 0.9 % IV BOLUS
1000.0000 mL | Freq: Once | INTRAVENOUS | Status: AC
  Administered 2022-06-17: 1000 mL via INTRAVENOUS

## 2022-06-17 MED ORDER — POTASSIUM CHLORIDE 10 MEQ/100ML IV SOLN: 10.0000 meq | INTRAVENOUS | Status: DC

## 2022-06-17 MED ORDER — SODIUM CHLORIDE 0.9 % IV BOLUS
500.0000 mL | Freq: Once | INTRAVENOUS | Status: AC
  Administered 2022-06-17: 500 mL via INTRAVENOUS

## 2022-06-17 MED ORDER — POTASSIUM CHLORIDE 20 MEQ PO PACK
40.0000 meq | PACK | ORAL | Status: AC
  Administered 2022-06-17 (×3): 40 meq
  Filled 2022-06-17 (×3): qty 2

## 2022-06-17 MED ORDER — POTASSIUM CHLORIDE CRYS ER 20 MEQ PO TBCR: 20.0000 meq | EXTENDED_RELEASE_TABLET | ORAL | Status: DC

## 2022-06-17 MED ORDER — FLUDROCORTISONE ACETATE 0.1 MG PO TABS
0.3000 mg | ORAL_TABLET | Freq: Three times a day (TID) | ORAL | Status: DC
  Administered 2022-06-17 – 2022-06-21 (×14): 0.3 mg
  Filled 2022-06-17 (×17): qty 3

## 2022-06-17 NOTE — Progress Notes (Addendum)
Transcranial Doppler   Date POD PCO2 HCT BP   MCA ACA PCA OPHT SIPH VERT Basilar  4/17,rs         Right  Left   41  59   -35  -37   24 26 23  16    47  40   -25  -28   -36       4/19 GC         Right  Left   77  73   -35  -45   11  49   19  19   48  20   *  *   *       4/22  [facility]       139/86  Right  Left    118   83    *   -27   *   19    *   *    *   *    -26   -39    -76        4/24  RH        142/101 Right  Left    140   76   -22   -44    45   46    26   26    61   30    *   -30    -47        4/26  RH        136/93 Right  Left    177   57    -50   -43    47   23    22   24     59   45    -18   -25    -45        4/29 GC         Right  Left    125   100    -14   -73    14   -24    26   15     35   20    -31   -37    -41        5/1 RH       205/98  Right  Left    165   55    -30   -33    34   24    19   20     44   38    -38   -20    -35       5/3 RS     Right Left 134 84 -79 -64 31 40 68 35 33 62 -43 -34 -51      MCA = Middle Cerebral Artery      OPHT = Opthalmic Artery     BASILAR = Basilar Artery   ACA = Anterior Cerebral Artery     SIPH = Carotid Siphon PCA = Posterior Cerebral Artery   VERT = Verterbral Artery                    Normal MCA = 62+\-12 ACA = 50+\-12 PCA = 42+\-23    * - Unable to insonate   Lindegaard Ratio - Right = 3.6, Left = 2.2   06/17/2022  2:18 PM , Gerarda Gunther

## 2022-06-17 NOTE — Progress Notes (Addendum)
NAME:  Kristin Simmons, MRN:  [id number], DOB:  04/28/1985, LOS: 17 ADMISSION DATE:  05/31/2022, CONSULTATION DATE: 06/13/2022 REFERRING MD: Conchita Paris - NSGY CHIEF COMPLAINT: Vasospasm  History of Present Illness:  37 year old woman who presented for elective right clipping of MCA aneurysm. Experienced intraoperative rupture requiring mechanical ventilation.  Developed MCA territory edema with hemorrhagic transformation.  Eventually extubated and transferred to the floor.  Recently she has developed hyponatremia with sodium as low as 123.  She was treated for haemophilus pneumonia.  Has been improving with increasing periods of wakefulness but with weakness on the left side.  On 4/29, she experienced a change in mental status CT scan showed evidence of severe vasospasm.  She was transferred back to the ICU for hemodynamic augmentation.  Pertinent Medical History:   Past Medical History:  Diagnosis Date   Anemia    Anxiety    Arthritis    Asthma    Cancer (HCC)    cervical   Depression    Headache    Hypertension    She was taking her mother's blood pressure meds but states she doesn't need bp meds now.   Vaginal Pap smear, abnormal    Significant Hospital Events: Including procedures, antibiotic start and stop dates in addition to other pertinent events   4/16 Electively admitted by neurosurgery and underwent an right pterional craniectomy for clipping of MCA aneurysm, complex due to intraoperative rupture and need for temporary MCA occlusion. Postprocedure, remained on ventilator, moved to ICU and was also started on Cardene drip. Patient was seen by [facility] service as a consult for medical management. 4/18 CT head showed increased anterior and middle division R MCA territory edema with hemorrhagic transformation, increased mass effect with 4 mm leftward shift 4/20 Extubated 4/23 [facility] was involved in care as a consult for medical management. 4/28 Echocardiogram 70-75% EF, grade 1  diastolic dysfunction, mild LVH  4/29 Change in mental status with vasospasm on CT. PCCM reconsulted for pressors to achieve goal SBP > 180 in the setting of vasospasm, L IJ CVC placed. 4/30 Persistent mental status changes. NE , Neo and vaso added.Nimodipine freq adjusted. Started full liq diet  5/1 started 3% NaCl for CSW.  5/2 over evening hours starting diuresing large volumes of urine. Received several boluses NS. Na 132 on 5//2. (acutely down to 116 5/1 from 133 day prior). 3% NaCl reduced and then discontinued 5/3 sodium stable off from 3%.  Cerebral salt wasting continues to be a challenge requiring constant reevaluation of volume status and intermittent volume replacement  Interim History / Subjective:  Stable compared with yesterday's exam   Objective:  Blood pressure (Abnormal) 183/112, pulse (Abnormal) 115, temperature 99.2 F (37.3 C), temperature source Oral, resp. rate 18, height 5\' 4"  (1.626 m), weight 40.6 kg, last menstrual period 04/26/2022, SpO2 100 %. CVP:  [1 mmHg-5 mmHg] 2 mmHg      Intake/Output Summary (Last 24 hours) at 06/17/2022 0735 Last data filed at 06/17/2022 0700 Gross per 24 hour  Intake 6832.51 ml  Output 56213 ml  Net -3212.49 ml   Filed Weights   06/09/22 0500 06/11/22 0500 06/15/22 0865  Weight: 57.4 kg 48.8 kg 40.6 kg   Physical Examination: General 37 year old female patient currently lying in bed she is in no acute distress this morning apparently was up most of the evening once again exam very consistent with yesterday HEENT staple line with mild dependent edema otherwise unremarkable mucous membranes moist Pulmonary clear to auscultation Cardiac: Tachycardic  rhythm with heart rate in the 120s Abdomen: Soft nontender Extremities: Warm dry brisk cap refill Neuro: Awake, slow to respond and intermittently oriented when answering questions, continues to have tone on left side and spontaneously moves right side with 5 out of 5 strength, will  follow commands on the right. 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 Trend procalcitonin Day #3 Unasyn, anticipate 5 days total therapy Repeat chest x-ray  Hypo-osmolar Hyponatremia c/w CSW  Sodium stabilized over the evening. Plan Cont florinef 3 times daily (increase dosing) Avoid volume depletion Cont close obs of na Na 2gm TID VT   Dysphagia/aspiration risk Plan tubefeeds Thin diet when mental status allows (at this point MS fluctuates still)  Leukocytosis  White blood cells pending Plan Continue to trend fever curve, antibiotics as mentioned above  Best Practice: (right click and "Reselect all SmartList Selections" daily)   Diet/type: tubefeeds DVT prophylaxis: SCD GI prophylaxis: H2B Lines: Central line Foley:  N/A Code Status:  full code Last date of multidisciplinary goals of care discussion [Per primary team]  Critical care time: 31 min   Simonne Martinet ACNP-BC [facility] Pulmonary/Critical Care Pager # [phone] OR # [phone] if no answer

## 2022-06-17 NOTE — Progress Notes (Signed)
Physical Therapy Treatment Patient Details Name: Kristin Simmons MRN: [id number] DOB: 1985/03/30 Today's Date: 06/17/2022   History of Present Illness Pt is a 37yo female who was admitted for elective coiling of R MCA aneurysm however this was complicated by intraoperative rupture resulting in a R pterional craniectomy for clipping of MCA aneurysm. Placement of bone flap in subcutaneous abdominal pocket. PMH: anxiety, anemia, HTN, depression    PT Comments    Patient improving with cognition and some with core strength, able to actively turn head to L without command and in standing stood several seconds prior to assist by OT to pivot hips to sit in chair due to HR up to 170.  She still complains of neck pain when sitting and head not supported, and buttock pain in chair.  She was able to feed herself cup of peach jello with cues and assist for proximity of the cup to her face.  She also tried to manage cup of ice tea on her own but spilled down the front of her gown not managing it well.  Patient progressing with activity tolerance and continue to recommend inpatient rehab prior to d/c home.    Recommendations for follow up therapy are one component of a multi-disciplinary discharge planning process, led by the attending physician.  Recommendations may be updated based on patient status, additional functional criteria and insurance authorization.  Follow Up Recommendations       Assistance Recommended at Discharge Frequent or constant Supervision/Assistance  Patient can return home with the following Two people to help with walking and/or transfers;A lot of help with bathing/dressing/bathroom;Assistance with cooking/housework;Direct supervision/assist for medications management;Direct supervision/assist for financial management;Assist for transportation;Help with stairs or ramp for entrance   Equipment Recommendations  Other (comment) (TBA)    Recommendations for Other Services        Precautions / Restrictions Precautions Precautions: Fall Precaution Comments: R craniectomy, no bone flap, bone flap placed in abdomen, R wrist restraint, coretrak, flexiseal Restrictions Weight Bearing Restrictions: No     Mobility  Bed Mobility Overal bed mobility: Needs Assistance Bed Mobility: Rolling, Sidelying to Sit Rolling: Mod assist Sidelying to sit: +2 for physical assistance, Mod assist       General bed mobility comments: mod A from PT and OT to come up into sitting, initiation with RLE noted    Transfers Overall transfer level: Needs assistance Equipment used: 2 person hand held assist Transfers: Bed to chair/wheelchair/BSC Sit to Stand: Mod assist, +2 physical assistance Stand pivot transfers: +2 safety/equipment, +2 physical assistance, Total assist         General transfer comment: decreased initation in standing, with OT having to manually pivot hips to come into sitting    Ambulation/Gait                   Stairs             Wheelchair Mobility    Modified Rankin (Stroke Patients Only) Modified Rankin (Stroke Patients Only) Pre-Morbid Rankin Score: No symptoms Modified Rankin: Severe disability     Balance Overall balance assessment: Needs assistance Sitting-balance support: Feet supported Sitting balance-Leahy Scale: Poor Sitting balance - Comments: assist for head control (PT providing support and gentle stretching) minimal to no activation at trunk when OT attempting to move patient out of BOS to increase activity tolerance Postural control: Left lateral lean Standing balance support: Single extremity supported Standing balance-Leahy Scale: Zero Standing balance comment: +2 for standing  Cognition Arousal/Alertness: Awake/alert Behavior During Therapy: Restless Overall Cognitive Status: Impaired/Different from baseline Area of Impairment: Attention, Following commands,  Safety/judgement, Problem solving, Awareness, Memory                   Current Attention Level: Sustained Memory: Decreased short-term memory Following Commands: Follows one step commands with increased time, Follows one step commands consistently Safety/Judgement: Decreased awareness of safety, Decreased awareness of deficits Awareness: Intellectual Problem Solving: Slow processing, Decreased initiation, Difficulty sequencing, Requires verbal cues, Requires tactile cues General Comments: Patient more alert, able  to feed herself jello independently and oriented to date. Patient will have moments of impaired statements, then will state, "my stomach itches because of my bone flap"        Exercises      General Comments General comments (skin integrity, edema, etc.): HR max 170 in standing, RN present, back down to 142 in chair      Pertinent Vitals/Pain Pain Assessment Pain Assessment: Faces Faces Pain Scale: Hurts even more Pain Location: neck and buttock Pain Descriptors / Indicators: Grimacing, Moaning Pain Intervention(s): Monitored during session, Limited activity within patient's tolerance, Heat applied, Repositioned    Home Living                          Prior Function            PT Goals (current goals can now be found in the care plan section) Progress towards PT goals: Progressing toward goals    Frequency    Min 4X/week      PT Plan Current plan remains appropriate    Co-evaluation PT/OT/SLP Co-Evaluation/Treatment: Yes Reason for Co-Treatment: Complexity of the patient's impairments (multi-system involvement);Necessary to address cognition/behavior during functional activity;For patient/therapist safety;To address functional/ADL transfers PT goals addressed during session: Mobility/safety with mobility;Balance        AM-PAC PT "6 Clicks" Mobility   Outcome Measure  Help needed turning from your back to your side while in a flat  bed without using bedrails?: A Lot Help needed moving from lying on your back to sitting on the side of a flat bed without using bedrails?: Total Help needed moving to and from a bed to a chair (including a wheelchair)?: Total Help needed standing up from a chair using your arms (e.g., wheelchair or bedside chair)?: Total Help needed to walk in hospital room?: Total Help needed climbing 3-5 steps with a railing? : Total 6 Click Score: 7    End of Session Equipment Utilized During Treatment: Gait belt Activity Tolerance: Patient limited by fatigue Patient left: in chair;with call bell/phone within reach;with nursing/sitter in room;with chair alarm set Nurse Communication: Mobility status;Need for lift equipment PT Visit Diagnosis: Unsteadiness on feet (R26.81);Hemiplegia and hemiparesis Hemiplegia - Right/Left: Left Hemiplegia - dominant/non-dominant: Non-dominant Hemiplegia - caused by: Nontraumatic intracerebral hemorrhage     Time: 1515-1549 PT Time Calculation (min) (ACUTE ONLY): 34 min  Charges:  $Therapeutic Activity: 8-22 mins                     Sheran Lawless, PT Acute Rehabilitation Services [phone] 06/17/2022    [name] 06/17/2022, 4:28 PM

## 2022-06-17 NOTE — Progress Notes (Addendum)
eLink Physician-Brief Progress Note Patient Name: Kristin Simmons DOB: May 06, 1985 MRN: [id number]   Date of Service  06/17/2022  HPI/Events of Note  Notified by pharmacy that the patient has persistent hypokalemia newest K is 3.1 after 80 mEq.  40 meq delivered after the lab was drawn  eICU Interventions  Will add additional 80 mEq   0149 - generalized itchiness. No rash noted. & no new meds given tonight. Added one time hydroxyzine  Intervention Category Minor Interventions: Electrolytes abnormality - evaluation and management  Marquette Piontek 06/17/2022, 9:20 PM

## 2022-06-17 NOTE — Progress Notes (Signed)
Neurosurgery  NAEs o/n  Eyes open to voice, follows commands RUE/RLE Incision c/d  S/p clipping of ruptured MCA aneurysm -  sodium stable - ok to reduce induced hypertension, decrease goal SBP to 140.  If remains neurologically stable, can potentially lift triple H therapy tomorrow

## 2022-06-17 NOTE — Progress Notes (Signed)
Occupational Therapy Treatment Patient Details Name: Kristin Simmons MRN: [id number] DOB: 11-Aug-1985 Today's Date: 06/17/2022   History of present illness Pt is a 37yo female who was admitted for elective coiling of R MCA aneurysm however this was complicated by intraoperative rupture resulting in a R pterional craniectomy for clipping of MCA aneurysm. Placement of bone flap in subcutaneous abdominal pocket. PMH: anxiety, anemia, HTN, depression   OT comments  Session focus on sitting EOB, following commands, and increasing overall activity tolerance. Patient more alert and able to feed self jello and drink some tea sitting EOB. Improved cognition noted in places, and able to carry on a logical conversation but then would have moments of nonsensical sentences. HR up to 170 with stand pivot transfer, and tachy throughout session (RN aware and present for majority of session). Patient with increased ability to maintain midline with head, and able to complete minimal visual assessment with eyes noted to track to the L x2. OT recommendation remains appropriate, will continue to follow.    Recommendations for follow up therapy are one component of a multi-disciplinary discharge planning process, led by the attending physician.  Recommendations may be updated based on patient status, additional functional criteria and insurance authorization.    Assistance Recommended at Discharge Frequent or constant Supervision/Assistance  Patient can return home with the following  Two people to help with walking and/or transfers;Assistance with cooking/housework;Assistance with feeding;Direct supervision/assist for medications management;Direct supervision/assist for financial management;Assist for transportation;Help with stairs or ramp for entrance;A lot of help with bathing/dressing/bathroom   Equipment Recommendations  Other (comment) (will continue to assess)    Recommendations for Other Services       Precautions / Restrictions Precautions Precautions: Fall Precaution Comments: R craniectomy, no bone flap, bone flap placed in abdomen, R wrist restraint, coretrak, flexiseal Restrictions Weight Bearing Restrictions: No       Mobility Bed Mobility Overal bed mobility: Needs Assistance Bed Mobility: Rolling, Sidelying to Sit Rolling: Mod assist Sidelying to sit: +2 for physical assistance, Mod assist       General bed mobility comments: mod A from PT and OT to come up into sitting, initiation with RLE noted    Transfers Overall transfer level: Needs assistance Equipment used: 2 person hand held assist Transfers: Bed to chair/wheelchair/BSC   Stand pivot transfers: +2 safety/equipment, +2 physical assistance, Total assist         General transfer comment: decreased initation in standing, with OT having to manually pivot hips to come into sitting     Balance Overall balance assessment: Needs assistance Sitting-balance support: Feet supported Sitting balance-Leahy Scale: Poor Sitting balance - Comments: assist for head control (PT providing support and gentle stretching) minimal to no activation at trunk when OT attempting to move patient out of BOS to increase activity tolerance Postural control: Left lateral lean Standing balance support: Bilateral upper extremity supported Standing balance-Leahy Scale: Zero Standing balance comment: +2 for standing                           ADL either performed or assessed with clinical judgement   ADL Overall ADL's : Needs assistance/impaired Eating/Feeding: Supervision/ safety Eating/Feeding Details (indicate cue type and reason): able to feed self jello Grooming: Wash/dry hands;Wash/dry face;Min guard Grooming Details (indicate cue type and reason): with R hand                 Toilet Transfer: +2 for physical assistance;+2 for safety/equipment;Stand-pivot;Total  assistance Toilet Transfer Details (indicate cue  type and reason): Total A of 2 to transition to chair, able to assist with R leg minimally Toileting- Clothing Manipulation and Hygiene: Total assistance Toileting - Clothing Manipulation Details (indicate cue type and reason): rectal tube and foley currently       General ADL Comments: Session focus on sitting EOB, following commands, and increasing overall activity tolerance. Patient more alert and able to feed self jello and drink some tea sitting EOB. Improved cognition noted in places, and able to carry on a logical conversation but then would have moments of nonsensical sentences. HR up to 170 with stand pivot transfer, and tachy throughout session (RN aware and present for majority of session)    Extremity/Trunk Assessment              Vision   Vision Assessment?: Vision impaired- to be further tested in functional context;Yes Eye Alignment: Impaired (comment) Ocular Range of Motion: Restricted on the left;Restricted looking down;Restricted looking up Alignment/Gaze Preference: Gaze right (is much closer to midline more consistently) Tracking/Visual Pursuits: Decreased smoothness of vertical tracking;Decreased smoothness of horizontal tracking Additional Comments: patient able to visually track minimally in session to date, able to cross midline to the L x2, however blinks back to the middle after assessment   Perception     Praxis      Cognition Arousal/Alertness: Awake/alert Behavior During Therapy: Restless Overall Cognitive Status: Impaired/Different from baseline Area of Impairment: Attention, Following commands, Safety/judgement, Problem solving, Awareness, Memory                   Current Attention Level: Sustained Memory: Decreased short-term memory Following Commands: Follows one step commands with increased time, Follows one step commands consistently Safety/Judgement: Decreased awareness of safety, Decreased awareness of deficits Awareness:  Intellectual Problem Solving: Slow processing, Decreased initiation, Difficulty sequencing, Requires verbal cues, Requires tactile cues General Comments: Patient more alert, able  to feed herself jello independently and oriented to date. Patient will have moments of impaired statements, then will state, "my stomach itches because of my bone flap"        Exercises      Shoulder Instructions       General Comments HR up to 170 with stand pivot, RN present and aware    Pertinent Vitals/ Pain       Pain Assessment Pain Assessment: Faces Faces Pain Scale: Hurts even more Pain Location: neck and back Pain Descriptors / Indicators: Grimacing, Moaning Pain Intervention(s): Limited activity within patient's tolerance, Monitored during session, Repositioned  Home Living                                          Prior Functioning/Environment              Frequency  Min 2X/week        Progress Toward Goals  OT Goals(current goals can now be found in the care plan section)  Progress towards OT goals: Progressing toward goals  Acute Rehab OT Goals Patient Stated Goal: to get some drink OT Goal Formulation: With patient Time For Goal Achievement: 06/29/22 Potential to Achieve Goals: Good  Plan Discharge plan remains appropriate    Co-evaluation      Reason for Co-Treatment: Complexity of the patient's impairments (multi-system involvement);Necessary to address cognition/behavior during functional activity;For patient/therapist safety;To address functional/ADL transfers  AM-PAC OT "6 Clicks" Daily Activity     Outcome Measure   Help from another person eating meals?: A Little Help from another person taking care of personal grooming?: A Little Help from another person toileting, which includes using toliet, bedpan, or urinal?: Total Help from another person bathing (including washing, rinsing, drying)?: A Lot Help from another person to put  on and taking off regular upper body clothing?: A Lot Help from another person to put on and taking off regular lower body clothing?: Total 6 Click Score: 12    End of Session Equipment Utilized During Treatment: Gait belt  OT Visit Diagnosis: Muscle weakness (generalized) (M62.81);Other abnormalities of gait and mobility (R26.89);Unsteadiness on feet (R26.81);Other symptoms and signs involving cognitive function;Hemiplegia and hemiparesis Hemiplegia - Right/Left: Left Hemiplegia - dominant/non-dominant: Non-Dominant Hemiplegia - caused by: Other cerebrovascular disease   Activity Tolerance Patient limited by fatigue;Patient limited by lethargy;Patient limited by pain   Patient Left in chair;with call bell/phone within reach;with chair alarm set;with nursing/sitter in room   Nurse Communication Mobility status        Time: 8119-1478 OT Time Calculation (min): 34 min  Charges: OT General Charges $OT Visit: 1 Visit OT Treatments $Self Care/Home Management : 8-22 mins  Pollyann Glen E. , OTR/L Acute Rehabilitation Services [phone]   Cherlyn Cushing 06/17/2022, 4:04 PM

## 2022-06-17 NOTE — Progress Notes (Signed)
Inpatient Rehab Admissions Coordinator:  Pt is not medically ready for a potential CIR admission. Will continue to follow.  Wolfgang Phoenix, MS, CCC-SLP Admissions Coordinator [phone]

## 2022-06-18 DIAGNOSIS — I619 Nontraumatic intracerebral hemorrhage, unspecified: Secondary | ICD-10-CM

## 2022-06-18 LAB — BASIC METABOLIC PANEL
Anion gap: 9 (ref 5–15)
BUN: 7 mg/dL (ref 6–20)
CO2: 27 mmol/L (ref 22–32)
Calcium: 9.5 mg/dL (ref 8.9–10.3)
Chloride: 96 mmol/L — ABNORMAL LOW (ref 98–111)
Creatinine, Ser: 0.4 mg/dL — ABNORMAL LOW (ref 0.44–1.00)
GFR, Estimated: >60 mL/min (ref >60)
Glucose, Bld: 120 mg/dL — ABNORMAL HIGH (ref 70–99)
Potassium: 4.4 mmol/L (ref 3.5–5.1)
Sodium: 132 mmol/L — ABNORMAL LOW (ref 135–145)

## 2022-06-18 LAB — SODIUM
Sodium: 137 mmol/L (ref 135–145)
Sodium: 135 mmol/L (ref 135–145)
Sodium: 134 mmol/L — ABNORMAL LOW (ref 135–145)
Sodium: 133 mmol/L — ABNORMAL LOW (ref 135–145)
Sodium: 135 mmol/L (ref 135–145)
Sodium: 133 mmol/L — ABNORMAL LOW (ref 135–145)

## 2022-06-18 MED ORDER — HYDROXYZINE HCL 25 MG PO TABS
25.0000 mg | ORAL_TABLET | Freq: Once | ORAL | Status: DC | PRN
  Filled 2022-06-18: qty 1

## 2022-06-18 MED ORDER — HYDROXYZINE HCL 25 MG PO TABS
25.0000 mg | ORAL_TABLET | Freq: Once | ORAL | Status: AC | PRN
  Administered 2022-06-18: 25 mg

## 2022-06-18 NOTE — Progress Notes (Signed)
NAME:  Kristin Simmons, MRN:  [id number], DOB:  July 22, 1985, LOS: 18 ADMISSION DATE:  05/31/2022, CONSULTATION DATE: 06/13/2022 REFERRING MD: Conchita Paris - NSGY CHIEF COMPLAINT: Vasospasm  History of Present Illness:  37 year old woman who presented for elective right clipping of MCA aneurysm. Experienced intraoperative rupture requiring mechanical ventilation.  Developed MCA territory edema with hemorrhagic transformation.  Eventually extubated and transferred to the floor.  Recently she has developed hyponatremia with sodium as low as 123.  She was treated for haemophilus pneumonia.  Has been improving with increasing periods of wakefulness but with weakness on the left side.  On 4/29, she experienced a change in mental status CT scan showed evidence of severe vasospasm.  She was transferred back to the ICU for hemodynamic augmentation.  Pertinent Medical History:   Past Medical History:  Diagnosis Date   Anemia    Anxiety    Arthritis    Asthma    Cancer (HCC)    cervical   Depression    Headache    Hypertension    She was taking her mother's blood pressure meds but states she doesn't need bp meds now.   Vaginal Pap smear, abnormal    Significant Hospital Events: Including procedures, antibiotic start and stop dates in addition to other pertinent events   4/16 Electively admitted by neurosurgery and underwent an right pterional craniectomy for clipping of MCA aneurysm, complex due to intraoperative rupture and need for temporary MCA occlusion. Postprocedure, remained on ventilator, moved to ICU and was also started on Cardene drip. Patient was [facility] by [facility] service as a consult for medical management. 4/18 CT head showed increased anterior and middle division R MCA territory edema with hemorrhagic transformation, increased mass effect with 4 mm leftward shift 4/20 Extubated 4/23 [facility] was involved in care as a consult for medical management. 4/28 Echocardiogram 70-75% EF, grade 1  diastolic dysfunction, mild LVH  4/29 Change in mental status with vasospasm on CT. PCCM reconsulted for pressors to achieve goal SBP > 180 in the setting of vasospasm, L IJ CVC placed. 4/30 Persistent mental status changes. NE , Neo and vaso added.Nimodipine freq adjusted. Started full liq diet  5/1 started 3% NaCl for CSW.  5/2 over evening hours starting diuresing large volumes of urine. Received several boluses NS. Na 132 on 5//2. (acutely down to 116 5/1 from 133 day prior). 3% NaCl reduced and then discontinued 5/3 sodium stable off from 3%.  Cerebral salt wasting continues to be a challenge requiring constant reevaluation of volume status and intermittent volume replacement  Interim History / Subjective:  Neurological exam is stable Vasopressor requirement is coming down Remain afebrile White count started trending down  Objective:  Blood pressure 129/71, pulse (!) 111, temperature 99.2 F (37.3 C), temperature source Axillary, resp. rate 19, height 5\' 4"  (1.626 m), weight 39.3 kg, last menstrual period 04/26/2022, SpO2 99 %. CVP:  [0 mmHg-13 mmHg] 11 mmHg      Intake/Output Summary (Last 24 hours) at 06/18/2022 1018 Last data filed at 06/18/2022 1000 Gross per 24 hour  Intake 5675.13 ml  Output 6600 ml  Net -924.87 ml   Filed Weights   06/15/22 0702 06/17/22 0705 06/18/22 0500  Weight: 40.6 kg 37.8 kg 39.3 kg   Physical Examination: Physical exam: General: Young female, lying on the bed HEENT: S/p right hemicrani, staples in place, with swelling around, eyes anicteric. moist mucus membranes Neuro: Generalized weak but opens eyes with vocal stimulus, antigravity on right side, withdrawing in  left side.  Sensation intact Chest: Coarse breath sounds, no wheezes or rhonchi Heart: Regular rate and rhythm, no murmurs or gallops Abdomen: Soft, nontender, nondistended, bowel sounds present Skin: No rash  Labs and images were reviewed   Resolved Hospital Problem List:     Assessment & Plan:  Subarachnoid hemorrhage in the setting of ruptured intraoperative right MCA aneurysm Symptomatic vasospasm Acute encephalopathy in the setting of subarachnoid hemorrhage Continue neuro watch Neurosurgery is following Recommend backing off on vasopressor support SBP goal 120 Continue nimodipine Continue Keppra for seizure prophylaxis Titrate Levophed and phenylephrine Out of bed to chair today  Sepsis due to aspiration PNA w/ Beta lactamase + H Influenza and also Enterobacter  White count started trending down Continue antibiotics Remain afebrile Sepsis indices have improved  Hypo-osmolar Hyponatremia c/w cerebral salt wasting Serum sodium trended down to 134 Continue IV fluid with normal saline Monitor intake and output Continue fludrocortisone and salt tablet 3% saline remain off Monitor serum sodium every 12 hours  Dysphagia Continue tube feeds  Anemia of critical illness Thrombocytosis Monitor H&H and platelet count  Best Practice: (right click and "Reselect all SmartList Selections" daily)   Diet/type: tubefeeds DVT prophylaxis: Subcu heparin GI prophylaxis: N/A Lines: Central line Foley:  N/A Code Status:  full code Last date of multidisciplinary goals of care discussion [Per primary team]   This patient is critically ill with multiple organ system failure which requires frequent high complexity decision making, assessment, support, evaluation, and titration of therapies. This was completed through the application of advanced monitoring technologies and extensive interpretation of multiple databases.  During this encounter critical care time was devoted to patient care services described in this note for 33 minutes.    Cheri Fowler, MD [facility] See Amion for pager If no response to pager, please call [phone] until 7pm After 7pm, Please call E-link [phone]

## 2022-06-18 NOTE — Progress Notes (Signed)
Subjective: Patient reports  no complaints continued left-sided hemiparesis but awake and alert now 3 weeks out from subarachnoid hemorrhage weaning triple H therapy  Objective: Vital signs in last 24 hours: Temp:  [97.7 F (36.5 C)-99.9 F (37.7 C)] 99.2 F (37.3 C) (05/04 0400) Pulse Rate:  [81-152] 114 (05/04 0830) Resp:  [12-40] 24 (05/04 0830) BP: (104-197)/(75-126) 175/126 (05/04 0830) SpO2:  [97 %-100 %] 100 % (05/04 0830) Weight:  [39.3 kg] 39.3 kg (05/04 0500)  Intake/Output from previous day: 05/03 0701 - 05/04 0700 In: 7001.6 [P.O.:240; I.V.:3667.5; ZO/XW:9604; IV Piggyback:1800.1] Out: 7025 [Urine:6275; Stool:750] Intake/Output this shift: Total I/O In: 455.8 [I.V.:245.8; NG/GT:110; IV Piggyback:100] Out: 350 [Urine:350]  Awake and alert left antipyresis  Lab Results: Recent Labs    06/16/22 0338  WBC 17.2*  HGB 10.7*  HCT 29.1*  PLT 777*   BMET Recent Labs    06/17/22 1923 06/17/22 2103 06/18/22 0302 06/18/22 0734  NA 137   < > 132*  134* 133*  K 3.1*  --  4.4  --   CL 96*  --  96*  --   CO2 29  --  27  --   GLUCOSE 133*  --  120*  --   BUN 9  --  7  --   CREATININE 0.41*  --  0.40*  --   CALCIUM 9.1  --  9.5  --    < > = values in this interval not displayed.    Studies/Results: VAS [place] TRANSCRANIAL DOPPLER  Result Date: 06/17/2022  Transcranial Doppler Patient Name:  Kristin Simmons  Date of Exam:   06/17/2022 Medical Rec #: [id number]         Accession #:    [id number] Date of Birth: 01/03/1986        Patient Gender: F Patient Age:   37 years Exam Location:  [facility] Procedure:      VAS [place] TRANSCRANIAL DOPPLER Referring Phys: Lisbeth Renshaw --------------------------------------------------------------------------------  Indications: Subarachnoid hemorrhage. 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 Performing Technologist: Marilynne Halsted RDMS, RVT  Examination Guidelines: A complete evaluation includes B-mode imaging,  spectral Doppler, color Doppler, and power Doppler as needed of all accessible portions of each vessel. Bilateral testing is considered an integral part of a complete examination. Limited examinations for reoccurring indications may be performed as noted.  +----------+---------------+----------+-----------+-------+ RIGHT TCD Right VM (cm/s)Depth (cm)PulsatilityComment +----------+---------------+----------+-----------+-------+ MCA             134         5.9       1.27            +----------+---------------+----------+-----------+-------+ ACA             -79                   1.16            +----------+---------------+----------+-----------+-------+ Term ICA        115                   1.22            +----------+---------------+----------+-----------+-------+ PCA P1          31                    0.99            +----------+---------------+----------+-----------+-------+ Opthalmic       68.  1.85            +----------+---------------+----------+-----------+-------+ ICA siphon      33.                   1.39            +----------+---------------+----------+-----------+-------+ Vertebral      -43.                   1.23            +----------+---------------+----------+-----------+-------+ Distal ICA      37.                   1.22            +----------+---------------+----------+-----------+-------+  +----------+--------------+----------+-----------+-------+ LEFT TCD  Left VM (cm/s)Depth (cm)PulsatilityComment +----------+--------------+----------+-----------+-------+ MCA            84.         5.2       1.29            +----------+--------------+----------+-----------+-------+ ACA            -64.                  0.96            +----------+--------------+----------+-----------+-------+ Term ICA       47.                   1.17            +----------+--------------+----------+-----------+-------+ PCA P1         40.                    1.03            +----------+--------------+----------+-----------+-------+ Opthalmic      35.                   1.83            +----------+--------------+----------+-----------+-------+ ICA siphon     62.                   1.17            +----------+--------------+----------+-----------+-------+ Vertebral      -34                                   +----------+--------------+----------+-----------+-------+ Distal ICA      37                                   +----------+--------------+----------+-----------+-------+  +------------+-------+-------+             VM cm/sComment +------------+-------+-------+ Prox Basilar  44           +------------+-------+-------+ Dist Basilar  51           +------------+-------+-------+  +----------------------+---+ Right Lindegaard Ratio3.6 +----------------------+---+ +---------------------+---+ Left Lindegaard Ratio2.2 +---------------------+---+    Preliminary    DG Chest Port 1 View  Result Date: 06/17/2022 CLINICAL DATA:  Pneumonia. EXAM: PORTABLE CHEST 1 VIEW COMPARISON:  June 13, 2022. FINDINGS: The heart size and mediastinal contours are within normal limits. Both lungs are clear. Feeding tube is seen entering stomach. Left internal jugular catheter is unchanged position. The visualized skeletal structures are unremarkable. IMPRESSION: Stable support apparatus. No acute cardiopulmonary abnormality seen. Electronically Signed  By: Lupita Raider M.D.   On: 06/17/2022 08:58    Assessment/Plan: Continue to wean triple H therapy mobilize with physical Occupational Therapy.  LOS: 18 days     Mariam Dollar 06/18/2022, 9:06 AM

## 2022-06-19 LAB — SODIUM: Sodium: 137 mmol/L (ref 135–145)

## 2022-06-19 LAB — CULTURE, BLOOD (ROUTINE X 2): Culture: NO GROWTH

## 2022-06-19 LAB — GLUCOSE, CAPILLARY: Glucose-Capillary: 101 mg/dL — ABNORMAL HIGH (ref 70–99)

## 2022-06-19 MED ORDER — LOPERAMIDE HCL 1 MG/7.5ML PO SUSP
4.0000 mg | Freq: Once | ORAL | Status: AC
  Administered 2022-06-19: 4 mg
  Filled 2022-06-19: qty 30

## 2022-06-19 MED ORDER — LOPERAMIDE HCL 1 MG/7.5ML PO SUSP
2.0000 mg | ORAL | Status: DC | PRN
  Administered 2022-06-19: 2 mg
  Filled 2022-06-19 (×2): qty 15

## 2022-06-19 MED ORDER — ENOXAPARIN SODIUM 30 MG/0.3ML IJ SOSY
30.0000 mg | PREFILLED_SYRINGE | INTRAMUSCULAR | Status: DC
  Administered 2022-06-19 – 2022-06-21 (×3): 30 mg via SUBCUTANEOUS
  Filled 2022-06-19 (×3): qty 0.3

## 2022-06-19 NOTE — Progress Notes (Signed)
Pharmacy Antibiotic Note  Kristin Simmons is a 37 y.o. female admitted on 05/31/2022 MCA aneurysm rupture new concern for sepsis.  Pharmacy has been consulted for Unasyn dosing.   CBC not checked since 5/2 Tmax 99.4 SCr 0.4, stable  Plan: Continue Unasyn 3g IV q6h  Follow culture data for de-escalation  Monitor renal function for dose adjustments as indicated   Height: 5\' 4"  (162.6 cm) Weight: 44.7 kg (98 lb 8.7 oz) IBW/kg (Calculated) : 54.7  Temp (24hrs), Avg:98.8 F (37.1 C), Min:98.4 F (36.9 C), Max:99.4 F (37.4 C)  Recent Labs  Lab 06/13/22 0206 06/14/22 0934 06/15/22 0615 06/15/22 0840 06/16/22 0338 06/17/22 0858 06/17/22 1923 06/18/22 0302  WBC 14.3* 17.3* 20.2*  --  17.2*  --   --   --   CREATININE 0.58 0.54  --  0.43* 0.39* 0.34* 0.41* 0.40*     Estimated Creatinine Clearance: 69.9 mL/min (A) (by C-G formula based on SCr of 0.4 mg/dL (L)).    Allergies  Allergen Reactions   Latex Itching and Rash    Antimicrobials this admission: Unasyn 5/1 >>   Dose adjustments this admission: N/A  Microbiology results: 4/30 BCx: NGTD x5d 4/19 trach aspirate: abundant Haemophilus influenzae (beta-lactamase positive), rare Enterobacter cloacae (S: cefepime, ceftazidime, cipro, gentamicin, imipenem, Bactrim, Zosyn) 4/16 MRSA PCR: not detected  Thank you for allowing pharmacy to be a part of this patient's care.  Wilburn Cornelia, PharmD, BCPS Clinical Pharmacist 06/19/2022 12:26 PM   Please refer to [facility] for pharmacy phone number

## 2022-06-19 NOTE — Progress Notes (Signed)
NAME:  Kristin Simmons, MRN:  [id number], DOB:  1985-10-23, LOS: 19 ADMISSION DATE:  05/31/2022, CONSULTATION DATE: 06/13/2022 REFERRING MD: Conchita Paris - NSGY CHIEF COMPLAINT: Vasospasm  History of Present Illness:  37 year old woman who presented for elective right clipping of MCA aneurysm. Experienced intraoperative rupture requiring mechanical ventilation.  Developed MCA territory edema with hemorrhagic transformation.  Eventually extubated and transferred to the floor.  Recently she has developed hyponatremia with sodium as low as 123.  She was treated for haemophilus pneumonia.  Has been improving with increasing periods of wakefulness but with weakness on the left side.  On 4/29, she experienced a change in mental status CT scan showed evidence of severe vasospasm.  She was transferred back to the ICU for hemodynamic augmentation.  Pertinent Medical History:   Past Medical History:  Diagnosis Date   Anemia    Anxiety    Arthritis    Asthma    Cancer (HCC)    cervical   Depression    Headache    Hypertension    She was taking her mother's blood pressure meds but states she doesn't need bp meds now.   Vaginal Pap smear, abnormal    Significant Hospital Events: Including procedures, antibiotic start and stop dates in addition to other pertinent events   4/16 Electively admitted by neurosurgery and underwent an right pterional craniectomy for clipping of MCA aneurysm, complex due to intraoperative rupture and need for temporary MCA occlusion. Postprocedure, remained on ventilator, moved to ICU and was also started on Cardene drip. Patient was seen by [facility] service as a consult for medical management. 4/18 CT head showed increased anterior and middle division R MCA territory edema with hemorrhagic transformation, increased mass effect with 4 mm leftward shift 4/20 Extubated 4/23 [facility] was involved in care as a consult for medical management. 4/28 Echocardiogram 70-75% EF, grade 1  diastolic dysfunction, mild LVH  4/29 Change in mental status with vasospasm on CT. PCCM reconsulted for pressors to achieve goal SBP > 180 in the setting of vasospasm, L IJ CVC placed. 4/30 Persistent mental status changes. NE , Neo and vaso added.Nimodipine freq adjusted. Started full liq diet  5/1 started 3% NaCl for CSW.  5/2 over evening hours starting diuresing large volumes of urine. Received several boluses NS. Na 132 on 5//2. (acutely down to 116 5/1 from 133 day prior). 3% NaCl reduced and then discontinued 5/3 sodium stable off from 3%.  Cerebral salt wasting continues to be a challenge requiring constant reevaluation of volume status and intermittent volume replacement  Interim History / Subjective:  Neurological exam is stable Vasopressor requirement is coming down, Levophed is off Currently on 30 mics of phenylephrine Remain afebrile Continue to have loose bowel movements  Objective:  Blood pressure 120/77, pulse 87, temperature 98.5 F (36.9 C), temperature source Oral, resp. rate 15, height 5\' 4"  (1.626 m), weight 44.7 kg, last menstrual period 04/26/2022, SpO2 100 %. CVP:  [0 mmHg-10 mmHg] 9 mmHg      Intake/Output Summary (Last 24 hours) at 06/19/2022 1022 Last data filed at 06/19/2022 0900 Gross per 24 hour  Intake 4556.97 ml  Output 3535 ml  Net 1021.97 ml   Filed Weights   06/17/22 0705 06/18/22 0500 06/19/22 0500  Weight: 37.8 kg 39.3 kg 44.7 kg   Physical Examination: Physical exam: General: Acutely ill-appearing female, lying on the bed HEENT: S/p right hemicrani with staples, with surrounding edema, eyes anicteric.  moist mucus membranes Neuro: Alert, awake following commands.  Left facial droop ECG, sensation intact, right-sided antigravity Chest: Coarse breath sounds, no wheezes or rhonchi Heart: Regular rate and rhythm, no murmurs or gallops Abdomen: Soft, nontender, nondistended, bowel sounds present Skin: No rash   Labs and images were  reviewed   Resolved Hospital Problem List:    Assessment & Plan:  Aneurysmal subarachnoid hemorrhage in the setting of ruptured intraoperative right MCA aneurysm Symptomatic vasospasm Acute encephalopathy in the setting of subarachnoid hemorrhage Continue neuro watch Neurosurgery is following Recommend backing off on vasopressor support SBP goal 120 Off Levophed, titrate off phenylephrine Continue nimodipine Discontinue Keppra Out of bed to chair again today  Sepsis due to aspiration PNA w/ Beta lactamase + H Influenza and also Enterobacter  White count started trending down Repeat CBC in the morning Continue antibiotics Remain afebrile Sepsis indices have improved  Hypo-osmolar Hyponatremia c/w cerebral salt wasting Serum sodium is ranging between 132-135 Continue IV fluid with normal saline Monitor intake and output Continue fludrocortisone and salt tablet Monitor serum sodium every 12 hours  Dysphagia Continue tube feeds  Anemia of critical illness Thrombocytosis Monitor H&H and platelet count  Best Practice: (right click and "Reselect all SmartList Selections" daily)   Diet/type: tubefeeds DVT prophylaxis: Subcu heparin GI prophylaxis: N/A Lines: Central line Foley:  N/A Code Status:  full code Last date of multidisciplinary goals of care discussion [Per primary team]   This patient is critically ill with multiple organ system failure which requires frequent high complexity decision making, assessment, support, evaluation, and titration of therapies. This was completed through the application of advanced monitoring technologies and extensive interpretation of multiple databases.  During this encounter critical care time was devoted to patient care services described in this note for 31 minutes.    Cheri Fowler, MD [facility] See Amion for pager If no response to pager, please call [phone] until 7pm After 7pm, Please call E-link  [phone]

## 2022-06-19 NOTE — Progress Notes (Signed)
Subjective: NAEs o/n.  Levophed gtt stopped  Objective: Vital signs in last 24 hours: Temp:  [98.1 F (36.7 C)-99.4 F (37.4 C)] 98.5 F (36.9 C) (05/05 0800) Pulse Rate:  [63-111] 87 (05/05 0930) Resp:  [14-31] 15 (05/05 0930) BP: (116-159)/(64-110) 120/77 (05/05 0930) SpO2:  [98 %-100 %] 100 % (05/05 0930) Weight:  [44.7 kg] 44.7 kg (05/05 0500)  Intake/Output from previous day: 05/04 0701 - 05/05 0700 In: 4692.9 [I.V.:2827; ZO/XW:9604; IV Piggyback:499.9] Out: 4275 [Urine:3700; Stool:575] Intake/Output this shift: Total I/O In: 319.9 [I.V.:209.9; NG/GT:110] Out: 85 [Urine:85]  Eyes open spont PERRL Left hemiparesis Incision c/d, flap soft  Lab Results: No results for input(s): "WBC", "HGB", "HCT", "PLT" in the last 72 hours. BMET Recent Labs    06/17/22 1923 06/17/22 2103 06/18/22 0302 06/18/22 0734 06/18/22 0943 06/18/22 2321  NA 137   < > 132*  134*   < > 135 133*  K 3.1*  --  4.4  --   --   --   CL 96*  --  96*  --   --   --   CO2 29  --  27  --   --   --   GLUCOSE 133*  --  120*  --   --   --   BUN 9  --  7  --   --   --   CREATININE 0.41*  --  0.40*  --   --   --   CALCIUM 9.1  --  9.5  --   --   --    < > = values in this interval not displayed.    Studies/Results: VAS [place] TRANSCRANIAL DOPPLER  Result Date: 06/18/2022  Transcranial Doppler Patient Name:  YODIT SMALE  Date of Exam:   06/17/2022 Medical Rec #: [id number]         Accession #:    [id number] Date of Birth: 10-Aug-1985        Patient Gender: F Patient Age:   64 years Exam Location:  [facility] Procedure:      VAS [place] TRANSCRANIAL DOPPLER Referring Phys: Lisbeth Renshaw --------------------------------------------------------------------------------  Indications: Subarachnoid hemorrhage. 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 Performing Technologist: Marilynne Halsted RDMS, RVT  Examination Guidelines: A complete evaluation includes B-mode imaging, spectral Doppler,  color Doppler, and power Doppler as needed of all accessible portions of each vessel. Bilateral testing is considered an integral part of a complete examination. Limited examinations for reoccurring indications may be performed as noted.  +----------+---------------+----------+-----------+-------+ RIGHT TCD Right VM (cm/s)Depth (cm)PulsatilityComment +----------+---------------+----------+-----------+-------+ MCA             134         5.9       1.27            +----------+---------------+----------+-----------+-------+ ACA             -79                   1.16            +----------+---------------+----------+-----------+-------+ Term ICA        115                   1.22            +----------+---------------+----------+-----------+-------+ PCA P1          31  0.99            +----------+---------------+----------+-----------+-------+ Opthalmic       68.                   1.85            +----------+---------------+----------+-----------+-------+ ICA siphon      33.                   1.39            +----------+---------------+----------+-----------+-------+ Vertebral      -43.                   1.23            +----------+---------------+----------+-----------+-------+ Distal ICA      37.                   1.22            +----------+---------------+----------+-----------+-------+  +----------+--------------+----------+-----------+-------+ LEFT TCD  Left VM (cm/s)Depth (cm)PulsatilityComment +----------+--------------+----------+-----------+-------+ MCA            84.         5.2       1.29            +----------+--------------+----------+-----------+-------+ ACA            -64.                  0.96            +----------+--------------+----------+-----------+-------+ Term ICA       47.                   1.17            +----------+--------------+----------+-----------+-------+ PCA P1         40.                    1.03            +----------+--------------+----------+-----------+-------+ Opthalmic      35.                   1.83            +----------+--------------+----------+-----------+-------+ ICA siphon     62.                   1.17            +----------+--------------+----------+-----------+-------+ Vertebral      -34                                   +----------+--------------+----------+-----------+-------+ Distal ICA      37                                   +----------+--------------+----------+-----------+-------+  +------------+-------+-------+             VM cm/sComment +------------+-------+-------+ Prox Basilar  44           +------------+-------+-------+ Dist Basilar  51           +------------+-------+-------+  +----------------------+---+ Right Lindegaard Ratio3.6 +----------------------+---+ +---------------------+---+ Left Lindegaard Ratio2.2 +---------------------+---+  Summary:  Elevated right middle cerebral and terminal ICA mean flow velocities suggest mild vasospasm.Normal mean flow velocitie sin remaining identidfied vessels of anterior and posterior cerebral circulations. *See table(s) above for TCD measurements and  observations.  Diagnosing physician: Delia Heady MD Electronically signed by Delia Heady MD on 06/18/2022 at 10:13:54 AM.    Final     Assessment/Plan: S/p clipping for ruptured MCA aneurysm  LOS: 19 days  - continue to wean phenylephrine - sodium q12h - nimodipine   Bedelia Person 06/19/2022, 9:56 AM

## 2022-06-20 ENCOUNTER — Inpatient Hospital Stay (HOSPITAL_COMMUNITY): Payer: Medicaid Other

## 2022-06-20 DIAGNOSIS — I609 Nontraumatic subarachnoid hemorrhage, unspecified: Secondary | ICD-10-CM

## 2022-06-20 LAB — BASIC METABOLIC PANEL
Anion gap: 10 (ref 5–15)
BUN: 10 mg/dL (ref 6–20)
CO2: 29 mmol/L (ref 22–32)
Calcium: 8.7 mg/dL — ABNORMAL LOW (ref 8.9–10.3)
Chloride: 100 mmol/L (ref 98–111)
Creatinine, Ser: 0.52 mg/dL (ref 0.44–1.00)
GFR, Estimated: >60 mL/min (ref >60)
Glucose, Bld: 108 mg/dL — ABNORMAL HIGH (ref 70–99)
Potassium: 2.7 mmol/L — CL (ref 3.5–5.1)
Sodium: 139 mmol/L (ref 135–145)

## 2022-06-20 LAB — CBC WITH DIFFERENTIAL/PLATELET
Abs Immature Granulocytes: 0.02 10*3/uL (ref 0.00–0.07)
Basophils Absolute: 0 10*3/uL (ref 0.0–0.1)
Basophils Relative: 0 %
Eosinophils Absolute: 0.2 10*3/uL (ref 0.0–0.5)
Eosinophils Relative: 2 %
HCT: 25.4 % — ABNORMAL LOW (ref 36.0–46.0)
Hemoglobin: 8.6 g/dL — ABNORMAL LOW (ref 12.0–15.0)
Immature Granulocytes: 0 %
Lymphocytes Relative: 20 %
Lymphs Abs: 1.6 10*3/uL (ref 0.7–4.0)
MCH: 31.2 pg (ref 26.0–34.0)
MCHC: 33.9 g/dL (ref 30.0–36.0)
MCV: 92 fL (ref 80.0–100.0)
Monocytes Absolute: 0.4 10*3/uL (ref 0.1–1.0)
Monocytes Relative: 5 %
Neutro Abs: 5.6 10*3/uL (ref 1.7–7.7)
Neutrophils Relative %: 73 %
Platelets: 550 10*3/uL — ABNORMAL HIGH (ref 150–400)
RBC: 2.76 MIL/uL — ABNORMAL LOW (ref 3.87–5.11)
RDW: 14.6 % (ref 11.5–15.5)
WBC: 7.7 10*3/uL (ref 4.0–10.5)
nRBC: 0 % (ref 0.0–0.2)

## 2022-06-20 LAB — SODIUM
Sodium: 138 mmol/L (ref 135–145)
Sodium: 138 mmol/L (ref 135–145)

## 2022-06-20 MED ORDER — POTASSIUM CHLORIDE 20 MEQ PO PACK
40.0000 meq | PACK | Freq: Two times a day (BID) | ORAL | Status: AC
  Administered 2022-06-20 (×2): 40 meq
  Filled 2022-06-20 (×2): qty 2

## 2022-06-20 MED ORDER — POTASSIUM CHLORIDE 10 MEQ/100ML IV SOLN
10.0000 meq | INTRAVENOUS | Status: AC
  Administered 2022-06-20 (×6): 10 meq via INTRAVENOUS
  Filled 2022-06-20 (×6): qty 100

## 2022-06-20 MED ORDER — CALCIUM GLUCONATE-NACL 1-0.675 GM/50ML-% IV SOLN
1.0000 g | Freq: Once | INTRAVENOUS | Status: AC
  Administered 2022-06-20: 1000 mg via INTRAVENOUS
  Filled 2022-06-20: qty 50

## 2022-06-20 NOTE — Progress Notes (Signed)
Occupational Therapy Treatment Patient Details Name: Kristin Simmons MRN: [id number] DOB: 1985-07-17 Today's Date: 06/20/2022   History of present illness Pt is a 37yo female who was admitted for elective coiling of R MCA aneurysm however this was complicated by intraoperative rupture resulting in a R pterional craniectomy for clipping of MCA aneurysm. Placement of bone flap in subcutaneous abdominal pocket. PMH: anxiety, anemia, HTN, depression   OT comments  Patient seen in conjunction with PT. Patent remains more alert, and requesting OT rub her neck when sitting EOB (taking OTs hand and placing it on her neck). Patient with max cues required to maintain upright sitting position, with significant L lateral lean, however noted to have improved ability to maintain head in midline and push neck back into extension with cues from PT. Patient placed in recliner at end of session (no activation noted in LLE with PT advancing). Recommendation remains appropriate, OT will continue to follow.    Recommendations for follow up therapy are one component of a multi-disciplinary discharge planning process, led by the attending physician.  Recommendations may be updated based on patient status, additional functional criteria and insurance authorization.    Assistance Recommended at Discharge Frequent or constant Supervision/Assistance  Patient can return home with the following  Two people to help with walking and/or transfers;Assistance with cooking/housework;Assistance with feeding;Direct supervision/assist for medications management;Direct supervision/assist for financial management;Assist for transportation;Help with stairs or ramp for entrance;A lot of help with bathing/dressing/bathroom   Equipment Recommendations  Other (comment) (will continue to assess)    Recommendations for Other Services      Precautions / Restrictions Precautions Precautions: Fall Precaution Comments: R craniectomy, no bone  flap, bone flap placed in abdomen, R wrist restraint, coretrak, flexiseal Restrictions Weight Bearing Restrictions: No       Mobility Bed Mobility Overal bed mobility: Needs Assistance Bed Mobility: Rolling, Sidelying to Sit Rolling: Mod assist Sidelying to sit: +2 for physical assistance, Mod assist       General bed mobility comments: mod A from PT and OT to come up into sitting, initiation with RLE noted    Transfers Overall transfer level: Needs assistance Equipment used: 2 person hand held assist Transfers: Bed to chair/wheelchair/BSC   Stand pivot transfers: +2 safety/equipment, +2 physical assistance, Total assist         General transfer comment: able to move RLE with moderate to max cues, PT manipulating LLE     Balance Overall balance assessment: Needs assistance Sitting-balance support: Feet supported Sitting balance-Leahy Scale: Poor Sitting balance - Comments: assist for head control (PT providing support and gentle stretching) minimal to no activation at trunk when OT attempting to move patient out of BOS to increase activity tolerance Postural control: Left lateral lean Standing balance support: Bilateral upper extremity supported Standing balance-Leahy Scale: Zero Standing balance comment: +2 for standing                           ADL either performed or assessed with clinical judgement   ADL Overall ADL's : Needs assistance/impaired Eating/Feeding: Supervision/ safety   Grooming: Wash/dry hands;Wash/dry face;Min guard Grooming Details (indicate cue type and reason): with R hand                 Toilet Transfer: +2 for physical assistance;+2 for safety/equipment;Stand-pivot;Total assistance Toilet Transfer Details (indicate cue type and reason): Total A of 2 to transition to chair, able to assist with R leg minimally Toileting- Clothing  Manipulation and Hygiene: Total assistance Toileting - Clothing Manipulation Details (indicate  cue type and reason): rectal tube and foley currently       General ADL Comments: Session focus on sitting EOB, following commands, and increasing overall activity tolerance. Patent remains more alert, and requesting OT rub her neck when sitting EOB (taking OTs hand and placing it on her neck) patient with max cues required to maintain upright sitting position, however noted to have improved ability to maintain head in midline and push neck back into extension with cues from PT. Patient placed in recliner at end of session (no activation noted in LLE with PT advancing).    Extremity/Trunk Assessment Upper Extremity Assessment LUE Deficits / Details: no activation noted            Vision   Vision Assessment?: Vision impaired- to be further tested in functional context;Yes Eye Alignment: Within Functional Limits Ocular Range of Motion: Restricted on the left;Restricted looking down;Restricted looking up Alignment/Gaze Preference: Gaze right;Within Defined Limits (noted to be in midline more consistently in session)   Perception     Praxis      Cognition Arousal/Alertness: Awake/alert Behavior During Therapy: Impulsive (minimally) Overall Cognitive Status: Impaired/Different from baseline Area of Impairment: Attention, Following commands, Safety/judgement, Problem solving, Awareness, Memory                   Current Attention Level: Sustained Memory: Decreased short-term memory Following Commands: Follows one step commands with increased time, Follows one step commands consistently Safety/Judgement: Decreased awareness of safety, Decreased awareness of deficits Awareness: Intellectual Problem Solving: Slow processing, Decreased initiation, Difficulty sequencing, Requires verbal cues, Requires tactile cues General Comments: Remains more alert, and more appropriate in session, requires cues for impusivity but able to respond to cues with improved reaction time         Exercises      Shoulder Instructions       General Comments      Pertinent Vitals/ Pain       Pain Assessment Pain Assessment: Faces Faces Pain Scale: Hurts even more Pain Location: neck Pain Descriptors / Indicators: Grimacing, Moaning Pain Intervention(s): Limited activity within patient's tolerance, Monitored during session, Repositioned, Patient requesting pain meds-RN notified  Home Living                                          Prior Functioning/Environment              Frequency  Min 2X/week        Progress Toward Goals  OT Goals(current goals can now be found in the care plan section)     Acute Rehab OT Goals Patient Stated Goal: to get my neck to stop hurting OT Goal Formulation: With patient Time For Goal Achievement: 06/29/22 Potential to Achieve Goals: Good  Plan Discharge plan remains appropriate    Co-evaluation      Reason for Co-Treatment: Complexity of the patient's impairments (multi-system involvement);Necessary to address cognition/behavior during functional activity;For patient/therapist safety;To address functional/ADL transfers          AM-PAC OT "6 Clicks" Daily Activity     Outcome Measure   Help from another person eating meals?: A Little Help from another person taking care of personal grooming?: A Little Help from another person toileting, which includes using toliet, bedpan, or urinal?: Total Help from another person bathing (including  washing, rinsing, drying)?: A Lot Help from another person to put on and taking off regular upper body clothing?: A Lot Help from another person to put on and taking off regular lower body clothing?: Total 6 Click Score: 12    End of Session Equipment Utilized During Treatment: Gait belt  OT Visit Diagnosis: Muscle weakness (generalized) (M62.81);Other abnormalities of gait and mobility (R26.89);Unsteadiness on feet (R26.81);Other symptoms and signs involving  cognitive function;Hemiplegia and hemiparesis Hemiplegia - Right/Left: Left Hemiplegia - dominant/non-dominant: Non-Dominant Hemiplegia - caused by: Other cerebrovascular disease   Activity Tolerance Patient limited by fatigue;Patient limited by lethargy;Patient limited by pain   Patient Left in chair;with call bell/phone within reach;with chair alarm set;with nursing/sitter in room   Nurse Communication Mobility status        Time: 1610-9604 OT Time Calculation (min): 27 min  Charges: OT General Charges $OT Visit: 1 Visit OT Treatments $Self Care/Home Management : 8-22 mins  Pollyann Glen E. , OTR/L Acute Rehabilitation Services [phone]   Cherlyn Cushing 06/20/2022, 4:09 PM

## 2022-06-20 NOTE — Progress Notes (Signed)
Physical Therapy Treatment Patient Details Name: Kristin Simmons MRN: [id number] DOB: [birth year] Today's Date: 06/20/2022   History of Present Illness Pt is a 37yo female who was admitted for elective coiling of R MCA aneurysm however this was complicated by intraoperative rupture resulting in a R pterional craniectomy for clipping of MCA aneurysm. Placement of bone flap in subcutaneous abdominal pocket. PMH: anxiety, anemia, HTN, depression    PT Comments    Patient progressing some with head and neck control and able to stand with L LE blocked though difficulty with head control when standing and c/o pain so limited standing today.  She can move the L leg some in supine and has some assist for stance control, but not functional.  She remains appropriate for intensive inpatient rehab prior to d/c home.  PT will continue to follow.    Recommendations for follow up therapy are one component of a multi-disciplinary discharge planning process, led by the attending physician.  Recommendations may be updated based on patient status, additional functional criteria and insurance authorization.  Follow Up Recommendations       Assistance Recommended at Discharge Frequent or constant Supervision/Assistance  Patient can return home with the following Two people to help with walking and/or transfers;A lot of help with bathing/dressing/bathroom;Assistance with cooking/housework;Direct supervision/assist for medications management;Direct supervision/assist for financial management;Assist for transportation;Help with stairs or ramp for entrance   Equipment Recommendations  Other (comment) (TBA)    Recommendations for Other Services       Precautions / Restrictions Precautions Precautions: Fall Precaution Comments: R craniectomy, no bone flap, bone flap placed in abdomen, R wrist restraint, coretrak, flexiseal Restrictions Weight Bearing Restrictions: No     Mobility  Bed Mobility Overal bed  mobility: Needs Assistance Bed Mobility: Rolling, Sidelying to Sit Rolling: Mod assist Sidelying to sit: +2 for physical assistance, Mod assist       General bed mobility comments: mod A from PT and OT to come up into sitting, initiation with RLE noted    Transfers Overall transfer level: Needs assistance Equipment used: 2 person hand held assist Transfers: Bed to chair/wheelchair/BSC   Stand pivot transfers: +2 safety/equipment, +2 physical assistance, Total assist         General transfer comment: able to move RLE with moderate to max cues and blocking L knee; assist to move L LE    Ambulation/Gait                   Stairs             Wheelchair Mobility    Modified Rankin (Stroke Patients Only) Modified Rankin (Stroke Patients Only) Pre-Morbid Rankin Score: No symptoms Modified Rankin: Severe disability     Balance Overall balance assessment: Needs assistance   Sitting balance-Leahy Scale: Poor Sitting balance - Comments: A for balance due to L lateral lean and head and neck flexion; able when upper trunk supported to lift head and press into PT sitting behind her several times though still limited head rotation L and easily fatigued Postural control: Left lateral lean Standing balance support: Bilateral upper extremity supported Standing balance-Leahy Scale: Zero Standing balance comment: +2 for standing                            Cognition Arousal/Alertness: Awake/alert Behavior During Therapy: Impulsive Overall Cognitive Status: Impaired/Different from baseline Area of Impairment: Attention, Following commands, Safety/judgement, Problem solving, Awareness, Memory  Current Attention Level: Sustained Memory: Decreased short-term memory Following Commands: Follows one step commands with increased time, Follows one step commands consistently Safety/Judgement: Decreased awareness of safety, Decreased awareness  of deficits Awareness: Intellectual Problem Solving: Slow processing, Decreased initiation, Difficulty sequencing, Requires verbal cues, Requires tactile cues General Comments: Remains more alert, and more appropriate in session, requires cues for impusivity but able to respond to cues with improved reaction time        Exercises      General Comments General comments (skin integrity, edema, etc.): VSS with mobility up to EOB/chair      Pertinent Vitals/Pain Pain Assessment Pain Assessment: Faces Faces Pain Scale: Hurts whole lot Pain Location: neck and head Pain Descriptors / Indicators: Grimacing, Moaning Pain Intervention(s): Monitored during session, Limited activity within patient's tolerance, Patient requesting pain meds-RN notified    Home Living                          Prior Function            PT Goals (current goals can now be found in the care plan section) Progress towards PT goals: Progressing toward goals    Frequency    Min 4X/week      PT Plan Current plan remains appropriate    Co-evaluation PT/OT/SLP Co-Evaluation/Treatment: Yes Reason for Co-Treatment: Complexity of the patient's impairments (multi-system involvement);Necessary to address cognition/behavior during functional activity;For patient/therapist safety;To address functional/ADL transfers PT goals addressed during session: Mobility/safety with mobility;Balance        AM-PAC PT "6 Clicks" Mobility   Outcome Measure  Help needed turning from your back to your side while in a flat bed without using bedrails?: A Lot Help needed moving from lying on your back to sitting on the side of a flat bed without using bedrails?: Total Help needed moving to and from a bed to a chair (including a wheelchair)?: Total Help needed standing up from a chair using your arms (e.g., wheelchair or bedside chair)?: Total Help needed to walk in hospital room?: Total Help needed climbing 3-5 steps  with a railing? : Total 6 Click Score: 7    End of Session Equipment Utilized During Treatment: Gait belt Activity Tolerance: Patient limited by pain Patient left: in chair;with call bell/phone within reach;with nursing/sitter in room;with chair alarm set Nurse Communication: Mobility status;Need for lift equipment PT Visit Diagnosis: Unsteadiness on feet (R26.81);Hemiplegia and hemiparesis Hemiplegia - Right/Left: Left Hemiplegia - dominant/non-dominant: Non-dominant Hemiplegia - caused by: Nontraumatic intracerebral hemorrhage     Time: 1610-9604 PT Time Calculation (min) (ACUTE ONLY): 27 min  Charges:  $Therapeutic Activity: 8-22 mins                     Sheran Lawless, PT Acute Rehabilitation Services Office:[phone] 06/20/2022    Kristin Simmons 06/20/2022, 5:24 PM

## 2022-06-20 NOTE — Progress Notes (Signed)
Subjective: NAEs o/n  Objective: Vital signs in last 24 hours: Temp:  [97.2 F (36.2 C)-98.5 F (36.9 C)] 97.2 F (36.2 C) (05/06 0800) Pulse Rate:  [81-108] 88 (05/06 1300) Resp:  [14-21] 18 (05/06 1300) BP: (116-145)/(71-117) 135/104 (05/06 1300) SpO2:  [98 %-100 %] 100 % (05/06 1300)  Intake/Output from previous day: 05/05 0701 - 05/06 0700 In: 3875.4 [I.V.:2155.4; NG/GT:1320; IV Piggyback:400] Out: 1880 [Urine:1350; Stool:530] Intake/Output this shift: Total I/O In: 1248.5 [I.V.:115.4; NG/GT:515; IV Piggyback:618.1] Out: 1770 [Urine:1770] NAD Eyes open spont, no spontaneous speech PERRL Feeding tube in place Off gtts Incision c/d Left hemiparesis  Lab Results: Recent Labs    06/20/22 0614  WBC 7.7  HGB 8.6*  HCT 25.4*  PLT 550*   BMET Recent Labs    06/18/22 0302 06/18/22 0734 06/20/22 0049 06/20/22 0614  NA 132*  134*   < > 138 139  138  K 4.4  --   --  2.7*  CL 96*  --   --  100  CO2 27  --   --  29  GLUCOSE 120*  --   --  108*  BUN 7  --   --  10  CREATININE 0.40*  --   --  0.52  CALCIUM 9.5  --   --  8.7*   < > = values in this interval not displayed.    Studies/Results: VAS [place] TRANSCRANIAL DOPPLER  Result Date: 06/20/2022  Transcranial Doppler Patient Name:  KELLIJO LEGATE  Date of Exam:   06/20/2022 Medical Rec #: [id number]         Accession #:    [id number] Date of Birth: 1985-11-25        Patient Gender: F Patient Age:   37 years Exam Location:  [facility] Procedure:      VAS [place] TRANSCRANIAL DOPPLER Referring Phys: Lisbeth Renshaw --------------------------------------------------------------------------------  Indications: Subarachnoid hemorrhage. Limitations: Patient movement Performing Technologist: Jean Rosenthal RDMS, RVT  Examination Guidelines: A complete evaluation includes B-mode imaging, spectral Doppler, color Doppler, and power Doppler as needed of all accessible portions of each vessel. Bilateral testing is considered an  integral part of a complete examination. Limited examinations for reoccurring indications may be performed as noted.  +----------+---------------+----------+-----------+------------------+ RIGHT TCD Right VM (cm/s)Depth (cm)Pulsatility     Comment       +----------+---------------+----------+-----------+------------------+ MCA            84.00                  0.75                       +----------+---------------+----------+-----------+------------------+ ACA           -34.00                  0.58                       +----------+---------------+----------+-----------+------------------+ Term ICA                                      Unable to insonate +----------+---------------+----------+-----------+------------------+ PCA P1         52.00                  0.94                       +----------+---------------+----------+-----------+------------------+  Opthalmic      41.00                  0.95                       +----------+---------------+----------+-----------+------------------+ ICA siphon     79.00                  0.58                       +----------+---------------+----------+-----------+------------------+ Vertebral     -43.00                  0.80                       +----------+---------------+----------+-----------+------------------+ Distal ICA     23.00                                             +----------+---------------+----------+-----------+------------------+  +----------+--------------+----------+-----------+-------+ LEFT TCD  Left VM (cm/s)Depth (cm)PulsatilityComment +----------+--------------+----------+-----------+-------+ MCA           104.00       5.4       0.77            +----------+--------------+----------+-----------+-------+ ACA           -37.00                 0.84            +----------+--------------+----------+-----------+-------+ Term ICA      72.00                  0.80             +----------+--------------+----------+-----------+-------+ PCA P1        26.00                  0.98            +----------+--------------+----------+-----------+-------+ Opthalmic     25.00                  0.87            +----------+--------------+----------+-----------+-------+ ICA siphon    57.00                  1.05            +----------+--------------+----------+-----------+-------+ Vertebral     -48.00                 0.71            +----------+--------------+----------+-----------+-------+ Distal ICA    30.00                                  +----------+--------------+----------+-----------+-------+  +------------+-------+-------+             VM cm/sComment +------------+-------+-------+ Prox Basilar-79.00         +------------+-------+-------+ Dist Basilar-124.00        +------------+-------+-------+ +---------+---------------+----------+-----------+------------------+ RIGHT TCDRight VM (cm/s)Depth (cm)Pulsatility     Comment       +---------+---------------+----------+-----------+------------------+ Term ICA  Unable to insonate +---------+---------------+----------+-----------+------------------+     Preliminary     Assessment/Plan: S/p clipping of ruptured MCA aneurysm - transfer to stepdown -continue to mobilize - advance diet with SLP

## 2022-06-20 NOTE — Progress Notes (Signed)
NAME:  ZAKIA POPHAM, MRN:  [id number], DOB:  [birth year], LOS: 20 ADMISSION DATE:  05/31/2022, CONSULTATION DATE: 06/13/2022 REFERRING MD: Conchita Paris - NSGY CHIEF COMPLAINT: Vasospasm  History of Present Illness:  37 year old woman who presented for elective right clipping of MCA aneurysm. Experienced intraoperative rupture requiring mechanical ventilation.  Developed MCA territory edema with hemorrhagic transformation.  Eventually extubated and transferred to the floor.  Recently she has developed hyponatremia with sodium as low as 123.  She was treated for haemophilus pneumonia.  Has been improving with increasing periods of wakefulness but with weakness on the left side.  On 4/29, she experienced a change in mental status CT scan showed evidence of severe vasospasm.  She was transferred back to the ICU for hemodynamic augmentation.  Pertinent Medical History:   Past Medical History:  Diagnosis Date   Anemia    Anxiety    Arthritis    Asthma    Cancer (HCC)    cervical   Depression    Headache    Hypertension    She was taking her mother's blood pressure meds but states she doesn't need bp meds now.   Vaginal Pap smear, abnormal    Significant Hospital Events: Including procedures, antibiotic start and stop dates in addition to other pertinent events   4/16 Electively admitted by neurosurgery and underwent an right pterional craniectomy for clipping of MCA aneurysm, complex due to intraoperative rupture and need for temporary MCA occlusion. Postprocedure, remained on ventilator, moved to ICU and was also started on Cardene drip. Patient was seen by [facility] service as a consult for medical management. 4/18 CT head showed increased anterior and middle division R MCA territory edema with hemorrhagic transformation, increased mass effect with 4 mm leftward shift 4/20 Extubated 4/23 [facility] was involved in care as a consult for medical management. 4/28 Echocardiogram 70-75% EF, grade 1  diastolic dysfunction, mild LVH  4/29 Change in mental status with vasospasm on CT. PCCM reconsulted for pressors to achieve goal SBP > 180 in the setting of vasospasm, L IJ CVC placed. 4/30 Persistent mental status changes. NE , Neo and vaso added.Nimodipine freq adjusted. Started full liq diet  5/1 started 3% NaCl for CSW.  5/2 over evening hours starting diuresing large volumes of urine. Received several boluses NS. Na 132 on 5//2. (acutely down to 116 5/1 from 133 day prior). 3% NaCl reduced and then discontinued 5/3 sodium stable off from 3%.  Cerebral salt wasting continues to be a challenge requiring constant reevaluation of volume status and intermittent volume replacement  Interim History / Subjective:  Neurological exam is stable Vasopressor support directed off Remain afebrile Continue to have loose bowel movements Serum sodium improved to 139  Objective:  Blood pressure (!) 139/100, pulse 96, temperature (!) 97.3 F (36.3 C), temperature source Oral, resp. rate 16, height 5\' 4"  (1.626 m), weight 44.7 kg, last menstrual period 04/26/2022, SpO2 100 %. CVP:  [0 mmHg-14 mmHg] 8 mmHg      Intake/Output Summary (Last 24 hours) at 06/20/2022 0745 Last data filed at 06/20/2022 0600 Gross per 24 hour  Intake 3674.27 ml  Output 1880 ml  Net 1794.27 ml   Filed Weights   06/17/22 0705 06/18/22 0500 06/19/22 0500  Weight: 37.8 kg 39.3 kg 44.7 kg   Physical Examination: Physical exam: General: Acutely ill-appearing female, lying on the bed HEENT: S/p right hemicrani, with staples, there is surrounding edema around surgical incision, eyes anicteric.  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 Symptomatic vasospasm Acute encephalopathy in the setting of subarachnoid hemorrhage Continue neuro watch Neurosurgery is following Now SBP goal is normal Vasopressors were titrated off Keppra was discontinued yesterday Continue nimodipine for 1 more day to complete 3 weeks therapy Again out of bed to chair today, continue to work with PT and OT  Sepsis due to aspiration PNA w/ Beta lactamase + H Influenza and also Enterobacter  Sepsis indices have improved White count trended down to 5.5 after antibiotic therapy Completed antibiotics Remain afebrile  Hypo-osmolar Hyponatremia c/w cerebral salt wasting, improved Serum sodium is improved to 139 Discontinue IV fluid Monitor intake and output Continue fludrocortisone and salt tablet  Hypokalemia/hypocalcemia Continue aggressive electrolyte supplement  Dysphagia Continue tube feeds  Anemia of critical illness Thrombocytosis H&H trended down to 8.6 Closely monitor, watch for signs of bleeding Platelet count improved to 550 from 777  Best Practice: (right click and "Reselect all SmartList Selections" daily)   Diet/type: tubefeeds DVT prophylaxis: Subcu lovenox GI prophylaxis: N/A Lines: Central line Foley:  DC today Code Status:  full code Last date of multidisciplinary goals of care discussion [Per primary team]    Cheri Fowler, MD [facility] See Amion for pager If no response to pager, please call [phone] until 7pm After 7pm, Please call E-link [phone]

## 2022-06-20 NOTE — Plan of Care (Signed)
  Problem: Education: Goal: Knowledge of disease or condition will improve Outcome: Progressing   

## 2022-06-20 NOTE — Progress Notes (Signed)
Pt with critical K 2.7. Dr. Merrily Pew notified. Orders received.

## 2022-06-21 ENCOUNTER — Inpatient Hospital Stay (HOSPITAL_COMMUNITY)
Admission: RE | Admit: 2022-06-21 | Discharge: 2022-07-20 | DRG: 057 | Disposition: A | Discharge status: Home / Self Care | Payer: Medicaid Other | Source: Intra-hospital | Attending: Physical Medicine and Rehabilitation | Admitting: Physical Medicine and Rehabilitation

## 2022-06-21 DIAGNOSIS — Z8249 Family history of ischemic heart disease and other diseases of the circulatory system: Secondary | ICD-10-CM | POA: Diagnosis not present

## 2022-06-21 DIAGNOSIS — I69019 Unspecified symptoms and signs involving cognitive functions following nontraumatic subarachnoid hemorrhage: Secondary | ICD-10-CM | POA: Diagnosis not present

## 2022-06-21 DIAGNOSIS — H539 Unspecified visual disturbance: Secondary | ICD-10-CM | POA: Diagnosis present

## 2022-06-21 DIAGNOSIS — I10 Essential (primary) hypertension: Secondary | ICD-10-CM | POA: Diagnosis present

## 2022-06-21 DIAGNOSIS — I69054 Hemiplegia and hemiparesis following nontraumatic subarachnoid hemorrhage affecting left non-dominant side: Secondary | ICD-10-CM | POA: Diagnosis present

## 2022-06-21 DIAGNOSIS — G3184 Mild cognitive impairment, so stated: Secondary | ICD-10-CM

## 2022-06-21 DIAGNOSIS — Z833 Family history of diabetes mellitus: Secondary | ICD-10-CM

## 2022-06-21 DIAGNOSIS — E871 Hypo-osmolality and hyponatremia: Secondary | ICD-10-CM | POA: Diagnosis present

## 2022-06-21 DIAGNOSIS — R339 Retention of urine, unspecified: Secondary | ICD-10-CM | POA: Diagnosis present

## 2022-06-21 DIAGNOSIS — Z820 Family history of epilepsy and other diseases of the nervous system: Secondary | ICD-10-CM

## 2022-06-21 DIAGNOSIS — Z818 Family history of other mental and behavioral disorders: Secondary | ICD-10-CM

## 2022-06-21 DIAGNOSIS — F17210 Nicotine dependence, cigarettes, uncomplicated: Secondary | ICD-10-CM | POA: Diagnosis present

## 2022-06-21 DIAGNOSIS — L299 Pruritus, unspecified: Secondary | ICD-10-CM | POA: Diagnosis not present

## 2022-06-21 DIAGNOSIS — I671 Cerebral aneurysm, nonruptured: Secondary | ICD-10-CM | POA: Diagnosis present

## 2022-06-21 DIAGNOSIS — E876 Hypokalemia: Secondary | ICD-10-CM | POA: Diagnosis present

## 2022-06-21 DIAGNOSIS — Z8349 Family history of other endocrine, nutritional and metabolic diseases: Secondary | ICD-10-CM

## 2022-06-21 DIAGNOSIS — K5900 Constipation, unspecified: Secondary | ICD-10-CM | POA: Diagnosis not present

## 2022-06-21 DIAGNOSIS — I63511 Cerebral infarction due to unspecified occlusion or stenosis of right middle cerebral artery: Secondary | ICD-10-CM | POA: Diagnosis not present

## 2022-06-21 DIAGNOSIS — J45909 Unspecified asthma, uncomplicated: Secondary | ICD-10-CM | POA: Diagnosis present

## 2022-06-21 DIAGNOSIS — I69098 Other sequelae following nontraumatic subarachnoid hemorrhage: Secondary | ICD-10-CM | POA: Diagnosis not present

## 2022-06-21 DIAGNOSIS — I609 Nontraumatic subarachnoid hemorrhage, unspecified: Secondary | ICD-10-CM | POA: Diagnosis not present

## 2022-06-21 DIAGNOSIS — I69090 Apraxia following nontraumatic subarachnoid hemorrhage: Secondary | ICD-10-CM

## 2022-06-21 DIAGNOSIS — Z825 Family history of asthma and other chronic lower respiratory diseases: Secondary | ICD-10-CM

## 2022-06-21 LAB — BASIC METABOLIC PANEL
Anion gap: 10 (ref 5–15)
BUN: 8 mg/dL (ref 6–20)
CO2: 27 mmol/L (ref 22–32)
Calcium: 9.6 mg/dL (ref 8.9–10.3)
Chloride: 98 mmol/L (ref 98–111)
Creatinine, Ser: 0.46 mg/dL (ref 0.44–1.00)
GFR, Estimated: >60 mL/min (ref >60)
Glucose, Bld: 87 mg/dL (ref 70–99)
Potassium: 3.6 mmol/L (ref 3.5–5.1)
Sodium: 135 mmol/L (ref 135–145)

## 2022-06-21 LAB — MAGNESIUM: Magnesium: 1.7 mg/dL (ref 1.7–2.4)

## 2022-06-21 LAB — PHOSPHORUS: Phosphorus: 4.8 mg/dL — ABNORMAL HIGH (ref 2.5–4.6)

## 2022-06-21 MED ORDER — ACETAMINOPHEN 160 MG/5ML PO SOLN
650.0000 mg | ORAL | Status: DC | PRN
  Administered 2022-06-22 – 2022-06-26 (×2): 650 mg
  Filled 2022-06-21 (×2): qty 20.3

## 2022-06-21 MED ORDER — OXYCODONE HCL 5 MG PO TABS
5.0000 mg | ORAL_TABLET | ORAL | Status: DC | PRN
  Administered 2022-06-21 – 2022-06-24 (×12): 5 mg
  Filled 2022-06-21 (×12): qty 1

## 2022-06-21 MED ORDER — LOPERAMIDE HCL 1 MG/7.5ML PO SUSP: 2.0000 mg | ORAL | Status: DC | PRN

## 2022-06-21 MED ORDER — BANATROL TF EN LIQD: 60.0000 mL | Freq: Two times a day (BID) | ENTERAL | Status: DC

## 2022-06-21 MED ORDER — FLUDROCORTISONE ACETATE 0.1 MG PO TABS
0.3000 mg | ORAL_TABLET | Freq: Three times a day (TID) | ORAL | Status: DC
  Administered 2022-06-21 – 2022-06-22 (×2): 0.3 mg
  Filled 2022-06-21 (×3): qty 3

## 2022-06-21 MED ORDER — VITAMIN B-1 100 MG PO TABS: 100.0000 mg | ORAL_TABLET | Freq: Every day | ORAL | Status: DC

## 2022-06-21 MED ORDER — PROSOURCE TF20 ENFIT COMPATIBL EN LIQD
60.0000 mL | Freq: Every day | ENTERAL | Status: DC
  Administered 2022-06-22 – 2022-06-25 (×4): 60 mL
  Filled 2022-06-21 (×6): qty 60

## 2022-06-21 MED ORDER — ACETAMINOPHEN 650 MG RE SUPP: 650.0000 mg | RECTAL | Status: DC | PRN

## 2022-06-21 MED ORDER — INSULIN ASPART 100 UNIT/ML IJ SOLN
0.0000 [IU] | Freq: Three times a day (TID) | INTRAMUSCULAR | Status: DC
  Administered 2022-06-22: 2 [IU] via SUBCUTANEOUS

## 2022-06-21 MED ORDER — FOLIC ACID 1 MG PO TABS: 1.0000 mg | ORAL_TABLET | Freq: Every day | ORAL | Status: DC

## 2022-06-21 MED ORDER — OXYCODONE HCL 5 MG PO TABS: 5.0000 mg | ORAL_TABLET | ORAL | 0 refills | Status: DC | PRN

## 2022-06-21 MED ORDER — LOPERAMIDE HCL 1 MG/7.5ML PO SUSP: 2.0000 mg | ORAL | 0 refills | Status: DC | PRN

## 2022-06-21 MED ORDER — ENOXAPARIN SODIUM 30 MG/0.3ML IJ SOSY: 30.0000 mg | PREFILLED_SYRINGE | INTRAMUSCULAR | Status: DC

## 2022-06-21 MED ORDER — BETHANECHOL CHLORIDE 5 MG PO TABS: 5.0000 mg | ORAL_TABLET | Freq: Three times a day (TID) | ORAL | Status: DC

## 2022-06-21 MED ORDER — GERHARDT'S BUTT CREAM: TOPICAL_CREAM | Freq: Every day | CUTANEOUS | Status: DC | PRN

## 2022-06-21 MED ORDER — ENOXAPARIN SODIUM 30 MG/0.3ML IJ SOSY
30.0000 mg | PREFILLED_SYRINGE | INTRAMUSCULAR | Status: DC
  Administered 2022-06-22 – 2022-06-23 (×2): 30 mg via SUBCUTANEOUS
  Filled 2022-06-21 (×2): qty 0.3

## 2022-06-21 MED ORDER — THIAMINE MONONITRATE 100 MG PO TABS
100.0000 mg | ORAL_TABLET | Freq: Every day | ORAL | Status: DC
  Administered 2022-06-22 – 2022-06-24 (×3): 100 mg
  Filled 2022-06-21 (×3): qty 1

## 2022-06-21 MED ORDER — OXYCODONE HCL 5 MG PO TABS
5.0000 mg | ORAL_TABLET | ORAL | Status: DC | PRN
  Administered 2022-06-21 (×2): 5 mg
  Filled 2022-06-21 (×2): qty 1

## 2022-06-21 MED ORDER — SODIUM CHLORIDE 1 G PO TABS
2.0000 g | ORAL_TABLET | Freq: Three times a day (TID) | ORAL | Status: DC
  Administered 2022-06-21 – 2022-06-24 (×8): 2 g
  Filled 2022-06-21 (×8): qty 2

## 2022-06-21 MED ORDER — ACETAMINOPHEN 325 MG PO TABS
650.0000 mg | ORAL_TABLET | ORAL | Status: DC | PRN
  Administered 2022-06-23 – 2022-06-27 (×13): 650 mg via ORAL
  Filled 2022-06-21 (×14): qty 2

## 2022-06-21 MED ORDER — BETHANECHOL CHLORIDE 10 MG PO TABS
5.0000 mg | ORAL_TABLET | Freq: Three times a day (TID) | ORAL | Status: DC
  Administered 2022-06-21 – 2022-06-23 (×5): 5 mg
  Filled 2022-06-21 (×5): qty 1

## 2022-06-21 MED ORDER — BANATROL TF EN LIQD
60.0000 mL | Freq: Two times a day (BID) | ENTERAL | Status: DC
  Administered 2022-06-21 – 2022-06-22 (×2): 60 mL
  Filled 2022-06-21 (×2): qty 60

## 2022-06-21 MED ORDER — FOLIC ACID 1 MG PO TABS
1.0000 mg | ORAL_TABLET | Freq: Every day | ORAL | Status: DC
  Administered 2022-06-22 – 2022-06-24 (×3): 1 mg
  Filled 2022-06-21 (×3): qty 1

## 2022-06-21 MED ORDER — OSMOLITE 1.5 CAL PO LIQD
1000.0000 mL | ORAL | Status: DC
  Administered 2022-06-21: 1000 mL

## 2022-06-21 MED ORDER — HYDRALAZINE HCL 10 MG PO TABS
10.0000 mg | ORAL_TABLET | ORAL | Status: DC | PRN
  Administered 2022-06-26 – 2022-06-30 (×4): 10 mg via ORAL
  Filled 2022-06-21 (×4): qty 1

## 2022-06-21 MED ORDER — FLORANEX PO PACK
1.0000 g | PACK | Freq: Three times a day (TID) | ORAL | Status: DC
  Administered 2022-06-22 – 2022-06-24 (×7): 1 g
  Filled 2022-06-21 (×8): qty 1

## 2022-06-21 MED ORDER — FLORANEX PO PACK: 1.0000 g | PACK | Freq: Three times a day (TID) | ORAL | Status: DC

## 2022-06-21 MED ORDER — CLONAZEPAM 0.5 MG PO TABS
0.5000 mg | ORAL_TABLET | Freq: Two times a day (BID) | ORAL | Status: DC | PRN
  Administered 2022-06-22 – 2022-06-23 (×2): 0.5 mg
  Filled 2022-06-21 (×2): qty 1

## 2022-06-21 MED ORDER — FLUDROCORTISONE ACETATE 0.1 MG PO TABS: 0.3000 mg | ORAL_TABLET | Freq: Three times a day (TID) | ORAL | Status: DC

## 2022-06-21 NOTE — Progress Notes (Signed)
Physical Therapy Treatment Patient Details Name: Kristin Simmons MRN: [id number] DOB: 1985-11-14 Today's Date: 06/21/2022   History of Present Illness Pt is a 37yo female who was admitted for elective coiling of R MCA aneurysm however this was complicated by intraoperative rupture resulting in a R pterional craniectomy for clipping of MCA aneurysm. Placement of bone flap in subcutaneous abdominal pocket. PMH: anxiety, anemia, HTN, depression    PT Comments    Patient progressing with sit to stand improved head control though not sustained and needing mod cues.  Also with R knee and hip extension though still needs support.  She tolerated well and participated well.  Seems eager to don her clothes from home so glad to get to rehab likely later today.  PT will follow up if not d/c.  Recommendations for follow up therapy are one component of a multi-disciplinary discharge planning process, led by the attending physician.  Recommendations may be updated based on patient status, additional functional criteria and insurance authorization.  Follow Up Recommendations       Assistance Recommended at Discharge Frequent or constant Supervision/Assistance  Patient can return home with the following Two people to help with walking and/or transfers;A lot of help with bathing/dressing/bathroom;Assistance with cooking/housework;Direct supervision/assist for medications management;Direct supervision/assist for financial management;Assist for transportation;Help with stairs or ramp for entrance   Equipment Recommendations  Other (comment) (TBA at next venue)    Recommendations for Other Services       Precautions / Restrictions Precautions Precautions: Fall Precaution Comments: R craniectomy, no bone flap, bone flap placed in abdomen, R hand mitt, coretrak, flexiseal     Mobility  Bed Mobility Overal bed mobility: Needs Assistance Bed Mobility: Rolling, Sidelying to Sit Rolling: Mod  assist Sidelying to sit: Mod assist, +2 for safety/equipment   Sit to supine: Mod assist, +2 for safety/equipment   General bed mobility comments: cues for rolling and using rail to help coming upright, assist for lifting trunk, positioning hips; righting head    Transfers Overall transfer level: Needs assistance Equipment used: 2 person hand held assist Transfers: Sit to/from Stand Sit to Stand: Mod assist, +2 physical assistance           General transfer comment: performed 5 x sit<>stand with mod cues for head righting in standing, assist for L hip and knee control in standing; for eccentric control stand to sit    Ambulation/Gait                   Stairs             Wheelchair Mobility    Modified Rankin (Stroke Patients Only) Modified Rankin (Stroke Patients Only) Pre-Morbid Rankin Score: No symptoms Modified Rankin: Severe disability     Balance Overall balance assessment: Needs assistance Sitting-balance support: Feet supported Sitting balance-Leahy Scale: Poor Sitting balance - Comments: min to mod A for balance, at times pushes to L, improved with facilitation through L ribs toward R hip   Standing balance support: Bilateral upper extremity supported Standing balance-Leahy Scale: Poor Standing balance comment: mod support on R knee and hip and cues for head righting, standing about 5-10 sec each trial for sit to stand                            Cognition Arousal/Alertness: Awake/alert Behavior During Therapy: WFL for tasks assessed/performed Overall Cognitive Status: Impaired/Different from baseline Area of Impairment: Attention, Following commands, Safety/judgement, Problem solving,  Awareness, Memory                   Current Attention Level: Sustained Memory: Decreased short-term memory Following Commands: Follows one step commands consistently, Follows one step commands with increased time Safety/Judgement: Decreased  awareness of safety, Decreased awareness of deficits              Exercises      General Comments General comments (skin integrity, edema, etc.): VSS with mobilityBP 143/104 after session, similar to rest of readings on monitor      Pertinent Vitals/Pain Pain Assessment Pain Assessment: Faces Faces Pain Scale: Hurts even more Pain Location: neck and buttocks Pain Descriptors / Indicators: Discomfort, Aching Pain Intervention(s): Monitored during session, Repositioned, Patient requesting pain meds-RN notified    Home Living                          Prior Function            PT Goals (current goals can now be found in the care plan section) Progress towards PT goals: Progressing toward goals    Frequency    Min 4X/week      PT Plan Current plan remains appropriate    Co-evaluation              AM-PAC PT "6 Clicks" Mobility   Outcome Measure  Help needed turning from your back to your side while in a flat bed without using bedrails?: A Lot Help needed moving from lying on your back to sitting on the side of a flat bed without using bedrails?: Total Help needed moving to and from a bed to a chair (including a wheelchair)?: Total Help needed standing up from a chair using your arms (e.g., wheelchair or bedside chair)?: A Lot Help needed to walk in hospital room?: Total Help needed climbing 3-5 steps with a railing? : Total 6 Click Score: 8    End of Session Equipment Utilized During Treatment: Gait belt Activity Tolerance: Patient limited by fatigue Patient left: in bed;with bed alarm set;with call bell/phone within reach Nurse Communication: Patient requests pain meds PT Visit Diagnosis: Unsteadiness on feet (R26.81);Hemiplegia and hemiparesis Hemiplegia - Right/Left: Left Hemiplegia - dominant/non-dominant: Non-dominant Hemiplegia - caused by: Nontraumatic intracerebral hemorrhage     Time: 1610-9604 PT Time Calculation (min) (ACUTE  ONLY): 30 min  Charges:  $Therapeutic Activity: 23-37 mins                     Sheran Lawless, PT Acute Rehabilitation Services Office:[phone] 06/21/2022    [name] 06/21/2022, 4:20 PM

## 2022-06-21 NOTE — Progress Notes (Signed)
Inpatient Rehabilitation Admission Medication Review by a Pharmacist   A complete drug regimen review was completed for this patient to identify any potential clinically significant medication issues.   High Risk Drug Classes Is patient taking? Indication by Medication  Antipsychotic No    Anticoagulant Yes Lovenox - DVT   Antibiotic No    Opioid Yes Oxycodone - pain  Antiplatelet No    Hypoglycemics/insulin Yes insulin - tube feed induced hyperglycemia  Vasoactive Medication Yes Hydralazine PRN HTN  Chemotherapy No    Other Yes Acetaminophen for mild pain Bethanecol - urinary spasm Clonazepam - anxiety  NaCl- hyponatremia  Florinef - hypotension        Type of Medication Issue Identified Description of Issue Recommendation(s)  Drug Interaction(s) (clinically significant)        Duplicate Therapy        Allergy        No Medication Administration End Date    NaCl tablets for hyponatremia   Fludrocortisone for hypotension Sodium level has been stable, assess for ability to stop  Assess for ability to taper off   Incorrect Dose        Additional Drug Therapy Needed        Significant med changes from prior encounter (inform family/care partners about these prior to discharge).  Held home amlodipine, naproxen, triamterene-hctz Restart if appropriate    Other            Clinically significant medication issues were identified that warrant physician communication and completion of prescribed/recommended actions by midnight of the next day:  No   Name of provider notified for urgent issues identified:    Provider Method of Notification:        Pharmacist comments:    Time spent performing this drug regimen review (minutes):  20    Alphia Moh, PharmD, BCPS, [facility] Clinical Pharmacist  Please check AMION for all [facility] Pharmacy phone numbers After 10:00 PM, call Main Pharmacy [phone]

## 2022-06-21 NOTE — Progress Notes (Signed)
Speech Language Pathology Treatment: Dysphagia;Cognitive-Linquistic  Patient Details Name: SAVITRI PETROW MRN: [id number] DOB: 10-13-1985 Today's Date: 06/21/2022 Time: 1761-6073 SLP Time Calculation (min) (ACUTE ONLY): 23 min  Assessment / Plan / Recommendation Clinical Impression  Pt is making excellent gains.  Today, she was was less impulsive, responding to cues and following directions more consistently.  Turn-taking  behaviors were more appropriate and she was able to cease talking in order to listen to examiner without constant cueing.  She demonstrated improved attention to self-feeding; consumed several crackers with improved mastication, requiring occasional verbal cues to attend to pocketing/spillage left side of mouth. She is ready to advance diet to dysphagia 3/thin liquids. New sign posted over bed and D/W RN.  SLP will continue to follow for both swallowing and cognition.   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 She remained on vent post op. ETT 4/16-4/20. Placement of bone flap in subcutaneous abdominal pocket. PMH: anxiety, anemia, HTN, depression.      SLP Plan  Continue with current plan of care      Recommendations for follow up therapy are one component of a multi-disciplinary discharge planning process, led by the attending physician.  Recommendations may be updated based on patient status, additional functional criteria and insurance authorization.    Recommendations   Diet recommendations: Dysphagia 3 (mechanical soft);Thin liquid Liquids provided via: Cup;Straw Medication Administration: Whole meds with liquid Supervision: Staff to assist with self feeding;Full supervision/cueing for compensatory strategies Compensations: Minimize environmental distractions;Lingual sweep for clearance of pocketing Postural Changes and/or Swallow Maneuvers: Seated upright 90 degrees                  Oral care BID   Frequent or constant Supervision/Assistance Dysphagia, oropharyngeal phase (R13.12)     Continue with current plan of care     L. Samson Frederic, MA CCC/SLP Clinical Specialist - Acute Care SLP Acute Rehabilitation Services Office number [phone]  Blenda Mounts Laurice  06/21/2022, 12:09 PM

## 2022-06-21 NOTE — Progress Notes (Signed)
  NEUROSURGERY PROGRESS NOTE   No issues overnight. Was c/o pain eariler.  EXAM:  BP (!) 128/102 (BP Location: Left Arm)   Pulse 96   Temp 98.4 F (36.9 C) (Oral)   Resp 17   Ht 5\' 4"  (1.626 m)   Wt 53.2 kg   LMP 04/26/2022 (Approximate) Comment: Urine pregnancy test negative 05/31/22  SpO2 99%   BMI 20.13 kg/m   Awake, alert Speech fluent Follows commands RUE/RLE Left hemiplegia Wound c/d/I, flap remains somewhat tense  IMPRESSION:  37 y.o. female SAH d# 21, remains stable with left hemiplegia Hyponatremia - improved  PLAN: - Cont supportive care - Will need placement, hopefully CIR - Cont PT/OT - Can d/c Nimodipine today   Lisbeth Renshaw, MD [facility]

## 2022-06-21 NOTE — Progress Notes (Signed)
Inpatient Rehab Admissions Coordinator:    I have a CIR bed for this Pt. Today. RN may call report to [phone].   I spoke with pt. And her mother [name]. They are in agreement for Pt. To admit to CIR to participate in an intensive rehab program for 18-21 days with the goal of discharging  home at [facility] assist level with assistance from her mother.   Megan Salon, MS, CCC-SLP Rehab Admissions Coordinator  [phone] (celll) [phone] (office)

## 2022-06-21 NOTE — H&P (Signed)
Physical Medicine and Rehabilitation Admission H&P     HPI: Kristin Simmons is a 37 year old right-handed female with history of hypertension as well as tobacco/alcohol use.  Patient with recent trip to the emergency department for some dental pain.  As part of her workup in the emergency department she underwent maxillofacial CT with contrast which incidentally discovered a right carotid aneurysm.  She was referred to neurosurgery.  Presented 05/31/2022 for elective right craniectomy for clipping of middle cerebral artery aneurysm placement of bone flap and subcutaneous abdominal pocket 05/31/2022 per Dr. Conchita Paris.  Hospital course patient experienced intraoperative rupture requiring mechanical ventilation with follow-up per critical care medicine.  Developed MCA territory edema with hemorrhagic transformation.  On 4/29 she experienced a change in mental status showed severe evidence of severe vasospasm.  CT angiogram showed evolving large right MCA territory infarction with herniation of tissue off of the right craniectomy more pronounced on prior CT.  New area of hypodensity in the right parietal lobe consistent with recent infarct as well as rightward midline shift.  She was transferred back to the ICU for hemodynamic augmentation and maintained on 3% NaCl..  Developed hyponatremia 123 as well as treated for haemophilus pneumonia and placed on sodium chloride tablets with latest sodium 135.  Responded to normal saline .  Bouts of hypokalemia with supplement added.  Echocardiogram with ejection fraction of 70 to 75% grade 1 diastolic dysfunction.  Patient was slowly extubated.  Currently on a mechanical soft thin liquid diet as well as nasogastric tube feeds for nutritional support..  She was cleared to begin Lovenox for DVT prophylaxis 06/19/2022.  Bouts of urinary retention placed on low-dose Urecholine.  Blood pressure remains soft placed on Florinef.  Therapy evaluations completed due to patient's  decreased functional mobility was admitted for a comprehensive rehab program.  Pt wouldn't wake up except briefly for me, so wasn't able to get her to answer questions- no family at bedside.   Review of Systems  Constitutional:  Negative for chills and fever.  HENT:  Negative for hearing loss.   Eyes:  Positive for blurred vision. Negative for double vision.  Respiratory:  Negative for cough, shortness of breath and wheezing.   Cardiovascular:  Negative for chest pain, palpitations and leg swelling.  Gastrointestinal:  Positive for constipation. Negative for heartburn, nausea and vomiting.  Genitourinary:  Negative for dysuria, flank pain and hematuria.  Skin:  Negative for rash.  Neurological:  Positive for dizziness, weakness and headaches.  Psychiatric/Behavioral:         Anxiety  All other systems reviewed and are negative.  Past Medical History:  Diagnosis Date   Anemia    Anxiety    Arthritis    Asthma    Cancer (HCC)    cervical   Depression    Headache    Hypertension    She was taking her mother's blood pressure meds but states she doesn't need bp meds now.   Vaginal Pap smear, abnormal    Past Surgical History:  Procedure Laterality Date   CRANIOTOMY Right 05/31/2022   Procedure: RIGHT PTERIONAL CRANIOTOMY FOR CLIPPING OF MIDDLE CEREBRAL ARTERY  ANEURYSM WITH PLACEMENT OF BONEFLAP IN ABDOMEN;  Surgeon: Lisbeth Renshaw, MD;  Location: [facility] OR;  Service: Neurosurgery;  Laterality: Right;   IR 3D INDEPENDENT WKST  03/08/2022   IR ANGIO INTRA EXTRACRAN SEL INTERNAL CAROTID BILAT MOD SED  03/08/2022   IR ANGIO VERTEBRAL SEL VERTEBRAL BILAT MOD SED  03/08/2022  IR ANGIOGRAM FOLLOW UP STUDY  03/08/2022   IR ANGIOGRAM FOLLOW UP STUDY  03/08/2022   IR ANGIOGRAM FOLLOW UP STUDY  03/08/2022   IR NEURO EACH ADD'L AFTER BASIC UNI RIGHT (MS)  03/08/2022   IR TRANSCATH/EMBOLIZ  03/08/2022   NO PAST SURGERIES     RADIOLOGY WITH ANESTHESIA N/A 03/08/2022   Procedure: Diagnostic  angiogram, Possible Coil Embolization of Aneurysm;  Surgeon: Lisbeth Renshaw, MD;  Location: [facility] OR;  Service: Radiology;  Laterality: N/A;   TUBAL LIGATION Bilateral 03/27/2018   Procedure: POST PARTUM TUBAL LIGATION;  Surgeon: Levie Heritage, DO;  Location: [facility];  Service: Gynecology;  Laterality: Bilateral;   Family History  Problem Relation Age of Onset   Alzheimer's disease Paternal Grandmother    Cancer Maternal Grandmother    Cancer Maternal Grandfather    Hypertension Father    Anemia Mother    Hypertension Mother    Thyroid disease Mother    Diabetes Sister    Hypertension Sister    Mental illness Brother    Asthma Daughter    Bronchitis Daughter    Asthma Daughter    Bronchitis Daughter    Asthma Daughter    Bronchitis Daughter    Social History:  reports that she has been smoking cigarettes. She has a 7.50 pack-year smoking history. She has never used smokeless tobacco. She reports current alcohol use of about 14.0 standard drinks of alcohol per week. She reports that she does not use drugs. Allergies:  Allergies  Allergen Reactions   Latex Itching and Rash   Medications Prior to Admission  Medication Sig Dispense Refill   acetaminophen (TYLENOL) 325 MG tablet Take 2 tablets (650 mg total) by mouth every 4 (four) hours as needed for mild pain (temp > 100.5).     amLODipine (NORVASC) 10 MG tablet Take 1 tablet (10 mg total) by mouth daily. 30 tablet 1   naproxen sodium (ALEVE) 220 MG tablet Take 220 mg by mouth daily as needed (in the AM).     triamterene-hydrochlorothiazide (MAXZIDE-25) 37.5-25 MG tablet Take 1 tablet by mouth daily. 30 tablet 1      Home: Home Living Family/patient expects to be discharged to:: Private residence Living Arrangements: Children Available Help at Discharge: Family, Available 24 hours/day Type of Home: House (townhome) Home Access: Level entry Home Layout: Bed/bath upstairs, Two level Alternate Level  Stairs-Number of Steps: 13 Bathroom Shower/Tub: Engineer, manufacturing systems: Standard Home Equipment: None Additional Comments: no bathroom on ground level, has 5 daughters ages 1 to 4  Lives With: Family   Functional History: Prior Function Prior Level of Function : Independent/Modified Independent  Functional Status:  Mobility: Bed Mobility Overal bed mobility: Needs Assistance Bed Mobility: Rolling, Sidelying to Sit Rolling: Mod assist Sidelying to sit: +2 for physical assistance, Mod assist Supine to sit: HOB elevated, Max assist, +2 for safety/equipment Sit to supine: Max assist, +2 for physical assistance General bed mobility comments: mod A from PT and OT to come up into sitting, initiation with RLE noted Transfers Overall transfer level: Needs assistance Equipment used: 2 person hand held assist Transfers: Bed to chair/wheelchair/BSC Sit to Stand: Mod assist, +2 physical assistance Bed to/from chair/wheelchair/BSC transfer type:: Step pivot Stand pivot transfers: +2 safety/equipment, +2 physical assistance, Total assist Squat pivot transfers: Total assist, +2 physical assistance Step pivot transfers: Max assist, +2 safety/equipment General transfer comment: able to move RLE with moderate to max cues and blocking L knee; assist to move L  LE Ambulation/Gait General Gait Details: pt unable    ADL: ADL Overall ADL's : Needs assistance/impaired Eating/Feeding: Supervision/ safety Eating/Feeding Details (indicate cue type and reason): able to feed self jello Grooming: Wash/dry hands, Therapist, nutritional, Min guard Grooming Details (indicate cue type and reason): with R hand Toilet Transfer: +2 for physical assistance, +2 for safety/equipment, Stand-pivot, Total assistance Toilet Transfer Details (indicate cue type and reason): Total A of 2 to transition to chair, able to assist with R leg minimally Toileting- Clothing Manipulation and Hygiene: Total assistance Toileting  - Clothing Manipulation Details (indicate cue type and reason): rectal tube and foley currently General ADL Comments: Session focus on sitting EOB, following commands, and increasing overall activity tolerance. Patent remains more alert, and requesting OT rub her neck when sitting EOB (taking OTs hand and placing it on her neck) patient with max cues required to maintain upright sitting position, however noted to have improved ability to maintain head in midline and push neck back into extension with cues from PT. Patient placed in recliner at end of session (no activation noted in LLE with PT advancing).  Cognition: Cognition Overall Cognitive Status: Impaired/Different from baseline Arousal/Alertness: Lethargic Orientation Level: Oriented X4 Attention: Sustained Sustained Attention: Impaired Sustained Attention Impairment: Functional basic, Verbal basic Awareness: Impaired Awareness Impairment: Intellectual impairment Cognition Arousal/Alertness: Awake/alert Behavior During Therapy: Impulsive Overall Cognitive Status: Impaired/Different from baseline Area of Impairment: Attention, Following commands, Safety/judgement, Problem solving, Awareness, Memory Current Attention Level: Sustained Memory: Decreased short-term memory Following Commands: Follows one step commands with increased time, Follows one step commands consistently Safety/Judgement: Decreased awareness of safety, Decreased awareness of deficits Awareness: Intellectual Problem Solving: Slow processing, Decreased initiation, Difficulty sequencing, Requires verbal cues, Requires tactile cues General Comments: Remains more alert, and more appropriate in session, requires cues for impusivity but able to respond to cues with improved reaction time Difficult to assess due to: Level of arousal  Physical Exam: Blood pressure (!) 128/102, pulse 96, temperature 98.4 F (36.9 C), temperature source Oral, resp. rate 17, height 5\' 4"   (1.626 m), weight 53.2 kg, last menstrual period 04/26/2022, SpO2 99 %. Physical Exam Vitals and nursing note reviewed.  Constitutional:      Comments: Pt asleep- woke briefly, but didn't answer questions; has mitten on R hand, NAD  HENT:     Head:     Comments: Craniotomy site clean and dry with staples intact.  There was some edema around the incision line R raccoon eye    Nose: Nose normal. No congestion.     Mouth/Throat:     Mouth: Mucous membranes are dry.     Pharynx: Oropharynx is clear. No oropharyngeal exudate.  Eyes:     General:        Right eye: No discharge.        Left eye: No discharge.     Comments: Eyes closed- no drainage from either  Cardiovascular:     Rate and Rhythm: Normal rate and regular rhythm.     Heart sounds: Normal heart sounds. No murmur heard.    No gallop.  Pulmonary:     Effort: Pulmonary effort is normal. No respiratory distress.     Breath sounds: Normal breath sounds. No wheezing, rhonchi or rales.  Abdominal:     General: There is no distension.     Palpations: Abdomen is soft.     Tenderness: There is no abdominal tenderness.     Comments: Honeycomb dressing with flap in place R side of abdomen  Genitourinary:    Comments: Rectal tube in place No foley/purewick Musculoskeletal:     Cervical back: Neck supple.     Comments: Appears that R side is moving - mitten only on R hand No spontaneous movement seen Didn't respond to tickling her hands, feet, etc  Skin:    General: Skin is warm and dry.     Comments: Not wearing SCDs  Neurological:     Comments: Patient is lethargic but arousable.  She would mostly keep her eyes closed during exam.  Follows simple commands.  She was able to provide her age and place. Pt slept for my exam No hoffman's, no clonus seen  Psychiatric:     Comments: sleeping     Results for orders placed or performed during the hospital encounter of 05/31/22 (from the past 48 hour(s))  Sodium     Status: None    Collection Time: 06/20/22 12:49 AM  Result Value Ref Range   Sodium 138 135 - 145 mmol/L    Comment: Performed at [facility], [address], [place], [place] [address]  CBC with Differential/Platelet     Status: Abnormal   Collection Time: 06/20/22  6:14 AM  Result Value Ref Range   WBC 7.7 4.0 - 10.5 K/uL   RBC 2.76 (L) 3.87 - 5.11 MIL/uL   Hemoglobin 8.6 (L) 12.0 - 15.0 g/dL   HCT 60.4 (L) 54.0 - 98.1 %   MCV 92.0 80.0 - 100.0 fL   MCH 31.2 26.0 - 34.0 pg   MCHC 33.9 30.0 - 36.0 g/dL   RDW 19.1 47.8 - 29.5 %   Platelets 550 (H) 150 - 400 K/uL   nRBC 0.0 0.0 - 0.2 %   Neutrophils Relative % 73 %   Neutro Abs 5.6 1.7 - 7.7 K/uL   Lymphocytes Relative 20 %   Lymphs Abs 1.6 0.7 - 4.0 K/uL   Monocytes Relative 5 %   Monocytes Absolute 0.4 0.1 - 1.0 K/uL   Eosinophils Relative 2 %   Eosinophils Absolute 0.2 0.0 - 0.5 K/uL   Basophils Relative 0 %   Basophils Absolute 0.0 0.0 - 0.1 K/uL   Immature Granulocytes 0 %   Abs Immature Granulocytes 0.02 0.00 - 0.07 K/uL    Comment: Performed at [facility], [address] [place], [place] [address]  Basic metabolic panel     Status: Abnormal   Collection Time: 06/20/22  6:14 AM  Result Value Ref Range   Sodium 139 135 - 145 mmol/L   Potassium 2.7 (LL) 3.5 - 5.1 mmol/L    Comment: CRITICAL RESULT CALLED TO, READ BACK BY AND VERIFIED WITH TRACEY HUFF [email or web address].@[email or web address]  ON 5.6.24 BY TCALDWELL MT. DELTA CHECK NOTED    Chloride 100 98 - 111 mmol/L   CO2 29 22 - 32 mmol/L   Glucose, Bld 108 (H) 70 - 99 mg/dL    Comment: Glucose reference range applies only to samples taken after fasting for at least 8 hours.   BUN 10 6 - 20 mg/dL   Creatinine, Ser 8.65 0.44 - 1.00 mg/dL   Calcium 8.7 (L) 8.9 - 10.3 mg/dL   GFR, Estimated >78 >46 mL/min    Comment: (NOTE) Calculated using the CKD-EPI Creatinine Equation (2021)    Anion gap 10 5 - 15    Comment: Performed at [facility], [address] [place], [place] [address]   Sodium     Status: None   Collection Time:  06/20/22  6:14 AM  Result Value Ref Range   Sodium 138 135 - 145 mmol/L    Comment: Performed at [facility], [address], [place], [place] [address]  Basic metabolic panel     Status: None   Collection Time: 06/21/22  4:08 AM  Result Value Ref Range   Sodium 135 135 - 145 mmol/L   Potassium 3.6 3.5 - 5.1 mmol/L   Chloride 98 98 - 111 mmol/L   CO2 27 22 - 32 mmol/L   Glucose, Bld 87 70 - 99 mg/dL    Comment: Glucose reference range applies only to samples taken after fasting for at least 8 hours.   BUN 8 6 - 20 mg/dL   Creatinine, Ser 6.04 0.44 - 1.00 mg/dL   Calcium 9.6 8.9 - 54.0 mg/dL   GFR, Estimated >98 >11 mL/min    Comment: (NOTE) Calculated using the CKD-EPI Creatinine Equation (2021)    Anion gap 10 5 - 15    Comment: Performed at [facility], [address], [place], [place] [address]  Magnesium     Status: None   Collection Time: 06/21/22  4:08 AM  Result Value Ref Range   Magnesium 1.7 1.7 - 2.4 mg/dL    Comment: Performed at [facility], [address], [place], [place] [address]  Phosphorus     Status: Abnormal   Collection Time: 06/21/22  4:08 AM  Result Value Ref Range   Phosphorus 4.8 (H) 2.5 - 4.6 mg/dL    Comment: Performed at [facility], [address] [place], [place] [address]   VAS [place] TRANSCRANIAL DOPPLER  Result Date: 06/21/2022  Transcranial Doppler Patient Name:  CRISTYN STOTLER  Date of Exam:   06/20/2022 Medical Rec #: [id number]         Accession #:    [id number] Date of Birth: 1985/11/18        Patient Gender: F Patient Age:   39 years Exam Location:  [facility] Procedure:      VAS [place] TRANSCRANIAL DOPPLER Referring Phys: Lisbeth Renshaw --------------------------------------------------------------------------------  Indications: Subarachnoid hemorrhage. Limitations: Patient movement Performing Technologist: Jean Rosenthal RDMS, RVT  Examination Guidelines: A complete  evaluation includes B-mode imaging, spectral Doppler, color Doppler, and power Doppler as needed of all accessible portions of each vessel. Bilateral testing is considered an integral part of a complete examination. Limited examinations for reoccurring indications may be performed as noted.  +----------+---------------+----------+-----------+------------------+ RIGHT TCD Right VM (cm/s)Depth (cm)Pulsatility     Comment       +----------+---------------+----------+-----------+------------------+ MCA            84.00                  0.75                       +----------+---------------+----------+-----------+------------------+ ACA           -34.00                  0.58                       +----------+---------------+----------+-----------+------------------+ Term ICA                                      Unable to insonate +----------+---------------+----------+-----------+------------------+ PCA P1  52.00                  0.94                       +----------+---------------+----------+-----------+------------------+ Opthalmic      41.00                  0.95                       +----------+---------------+----------+-----------+------------------+ ICA siphon     79.00                  0.58                       +----------+---------------+----------+-----------+------------------+ Vertebral     -43.00                  0.80                       +----------+---------------+----------+-----------+------------------+ Distal ICA     23.00                                             +----------+---------------+----------+-----------+------------------+  +----------+--------------+----------+-----------+-------+ LEFT TCD  Left VM (cm/s)Depth (cm)PulsatilityComment +----------+--------------+----------+-----------+-------+ MCA           104.00       5.4       0.77            +----------+--------------+----------+-----------+-------+ ACA            -37.00                 0.84            +----------+--------------+----------+-----------+-------+ Term ICA      72.00                  0.80            +----------+--------------+----------+-----------+-------+ PCA P1        26.00                  0.98            +----------+--------------+----------+-----------+-------+ Opthalmic     25.00                  0.87            +----------+--------------+----------+-----------+-------+ ICA siphon    57.00                  1.05            +----------+--------------+----------+-----------+-------+ Vertebral     -48.00                 0.71            +----------+--------------+----------+-----------+-------+ Distal ICA    30.00                                  +----------+--------------+----------+-----------+-------+  +------------+-------+-------+             VM cm/sComment +------------+-------+-------+ Prox Basilar-79.00         +------------+-------+-------+ Dist Basilar-124.00        +------------+-------+-------+ +---------+---------------+----------+-----------+------------------+ RIGHT TCDRight VM (cm/s)Depth (cm)Pulsatility  Comment       +---------+---------------+----------+-----------+------------------+ Term ICA                                     Unable to insonate +---------+---------------+----------+-----------+------------------+  Summary:  Mildly elevated left middle cerebral artery mean flow velocities suggest mild vasospasm. slightly elevated right middle cerebral artery mean flow velocities of unclear significance. *See table(s) above for TCD measurements and observations.  Diagnosing physician: Delia Heady MD Electronically signed by Delia Heady MD on 06/21/2022 at 12:01:32 PM.    Final       Blood pressure (!) 128/102, pulse 96, temperature 98.4 F (36.9 C), temperature source Oral, resp. rate 17, height 5\' 4"  (1.626 m), weight 53.2 kg, last menstrual period 04/26/2022, SpO2  99 %.  Medical Problem List and Plan: 1. Functional deficits secondary to incidental findings of right carotid aneurysm.  Status post right craniectomy clipping of middle cerebral artery aneurysm placement of bone flap in subcutaneous abdominal pocket 05/31/2022 per Dr. Conchita Paris complicated by MCA territory edema with hemorrhagic transformation/vasospasm  -patient may  shower if cover incisions  -ELOS/Goals: 18-20 days- supervision to min A 2.  Antithrombotics: -DVT/anticoagulation:  Pharmaceutical: Lovenox initiated 07/04/2022  -antiplatelet therapy: N/A 3. Pain Management: Oxycodone as needed 4. Mood/Behavior/Sleep: Open twice daily as needed provide emotional support  -antipsychotic agents: N/A 5. Neuropsych/cognition: This patient is capable of making decisions on her own behalf. 6. Skin/Wound Care: Routine skin checks 7. Fluids/Electrolytes/Nutrition: Routine in and outs with follow-up chemistries 8.  Hyponatremia.  Continue sodium chloride tablets 9.  Hypotension.  Florinef 0.3 mg 3 times daily 10.  Decreased nutritional storage.  Currently on a dysphagia #3 diet.  Nasogastric tube for nutritional support.  Dietary follow-up 11.  Urinary retention.  Urecholine 5 mg 3 times daily.  Check PVR 12.  History of tobacco/alcohol use.  Counseling  Mcarthur Rossetti Angiulli, PA-C 06/21/2022  I have personally performed a face to face diagnostic evaluation of this patient and formulated the key components of the plan.  Additionally, I have personally reviewed laboratory data, imaging studies, as well as relevant notes and concur with the physician assistant's documentation above.   The patient's status has not changed from the original H&P.  Any changes in documentation from the acute care chart have been noted above.

## 2022-06-21 NOTE — H&P (Signed)
Physical Medicine and Rehabilitation Admission H&P       HPI: Kristin Simmons is a 37 year old right-handed female with history of hypertension as well as tobacco/alcohol use.  Patient with recent trip to the emergency department for some dental pain.  As part of her workup in the emergency department she underwent maxillofacial CT with contrast which incidentally discovered a right carotid aneurysm.  She was referred to neurosurgery.  Presented 05/31/2022 for elective right craniectomy for clipping of middle cerebral artery aneurysm placement of bone flap and subcutaneous abdominal pocket 05/31/2022 per Dr. Conchita Paris.  Hospital course patient experienced intraoperative rupture requiring mechanical ventilation with follow-up per critical care medicine.  Developed MCA territory edema with hemorrhagic transformation.  On 4/29 she experienced a change in mental status showed severe evidence of severe vasospasm.  CT angiogram showed evolving large right MCA territory infarction with herniation of tissue off of the right craniectomy more pronounced on prior CT.  New area of hypodensity in the right parietal lobe consistent with recent infarct as well as rightward midline shift.  She was transferred back to the ICU for hemodynamic augmentation and maintained on 3% NaCl..  Developed hyponatremia 123 as well as treated for haemophilus pneumonia and placed on sodium chloride tablets with latest sodium 135.  Responded to normal saline .  Bouts of hypokalemia with supplement added.  Echocardiogram with ejection fraction of 70 to 75% grade 1 diastolic dysfunction.  Patient was slowly extubated.  Currently on a mechanical soft thin liquid diet as well as nasogastric tube feeds for nutritional support..  She was cleared to begin Lovenox for DVT prophylaxis 06/19/2022.  Bouts of urinary retention placed on low-dose Urecholine.  Blood pressure remains soft placed on Florinef.  Therapy evaluations completed due to patient's  decreased functional mobility was admitted for a comprehensive rehab program.   Pt wouldn't wake up except briefly for me, so wasn't able to get her to answer questions- no family at bedside.    Review of Systems  Constitutional:  Negative for chills and fever.  HENT:  Negative for hearing loss.   Eyes:  Positive for blurred vision. Negative for double vision.  Respiratory:  Negative for cough, shortness of breath and wheezing.   Cardiovascular:  Negative for chest pain, palpitations and leg swelling.  Gastrointestinal:  Positive for constipation. Negative for heartburn, nausea and vomiting.  Genitourinary:  Negative for dysuria, flank pain and hematuria.  Skin:  Negative for rash.  Neurological:  Positive for dizziness, weakness and headaches.  Psychiatric/Behavioral:         Anxiety  All other systems reviewed and are negative.       Past Medical History:  Diagnosis Date   Anemia     Anxiety     Arthritis     Asthma     Cancer (HCC)      cervical   Depression     Headache     Hypertension      She was taking her mother's blood pressure meds but states she doesn't need bp meds now.   Vaginal Pap smear, abnormal           Past Surgical History:  Procedure Laterality Date   CRANIOTOMY Right 05/31/2022    Procedure: RIGHT PTERIONAL CRANIOTOMY FOR CLIPPING OF MIDDLE CEREBRAL ARTERY  ANEURYSM WITH PLACEMENT OF BONEFLAP IN ABDOMEN;  Surgeon: Lisbeth Renshaw, MD;  Location: [facility] OR;  Service: Neurosurgery;  Laterality: Right;   IR 3D INDEPENDENT WKST  03/08/2022   IR ANGIO INTRA EXTRACRAN SEL INTERNAL CAROTID BILAT MOD SED   03/08/2022   IR ANGIO VERTEBRAL SEL VERTEBRAL BILAT MOD SED   03/08/2022   IR ANGIOGRAM FOLLOW UP STUDY   03/08/2022   IR ANGIOGRAM FOLLOW UP STUDY   03/08/2022   IR ANGIOGRAM FOLLOW UP STUDY   03/08/2022   IR NEURO EACH ADD'L AFTER BASIC UNI RIGHT (MS)   03/08/2022   IR TRANSCATH/EMBOLIZ   03/08/2022   NO PAST SURGERIES       RADIOLOGY WITH ANESTHESIA N/A  03/08/2022    Procedure: Diagnostic angiogram, Possible Coil Embolization of Aneurysm;  Surgeon: Lisbeth Renshaw, MD;  Location: [facility] OR;  Service: Radiology;  Laterality: N/A;   TUBAL LIGATION Bilateral 03/27/2018    Procedure: POST PARTUM TUBAL LIGATION;  Surgeon: Levie Heritage, DO;  Location: [facility];  Service: Gynecology;  Laterality: Bilateral;         Family History  Problem Relation Age of Onset   Alzheimer's disease Paternal Grandmother     Cancer Maternal Grandmother     Cancer Maternal Grandfather     Hypertension Father     Anemia Mother     Hypertension Mother     Thyroid disease Mother     Diabetes Sister     Hypertension Sister     Mental illness Brother     Asthma Daughter     Bronchitis Daughter     Asthma Daughter     Bronchitis Daughter     Asthma Daughter     Bronchitis Daughter      Social History:  reports that she has been smoking cigarettes. She has a 7.50 pack-year smoking history. She has never used smokeless tobacco. She reports current alcohol use of about 14.0 standard drinks of alcohol per week. She reports that she does not use drugs. Allergies:      Allergies  Allergen Reactions   Latex Itching and Rash          Medications Prior to Admission  Medication Sig Dispense Refill   acetaminophen (TYLENOL) 325 MG tablet Take 2 tablets (650 mg total) by mouth every 4 (four) hours as needed for mild pain (temp > 100.5).       amLODipine (NORVASC) 10 MG tablet Take 1 tablet (10 mg total) by mouth daily. 30 tablet 1   naproxen sodium (ALEVE) 220 MG tablet Take 220 mg by mouth daily as needed (in the AM).       triamterene-hydrochlorothiazide (MAXZIDE-25) 37.5-25 MG tablet Take 1 tablet by mouth daily. 30 tablet 1          Home: Home Living Family/patient expects to be discharged to:: Private residence Living Arrangements: Children Available Help at Discharge: Family, Available 24 hours/day Type of Home: House (townhome) Home Access:  Level entry Home Layout: Bed/bath upstairs, Two level Alternate Level Stairs-Number of Steps: 13 Bathroom Shower/Tub: Engineer, manufacturing systems: Standard Home Equipment: None Additional Comments: no bathroom on ground level, has 5 daughters ages 39 to 4  Lives With: Family   Functional History: Prior Function Prior Level of Function : Independent/Modified Independent   Functional Status:  Mobility: Bed Mobility Overal bed mobility: Needs Assistance Bed Mobility: Rolling, Sidelying to Sit Rolling: Mod assist Sidelying to sit: +2 for physical assistance, Mod assist Supine to sit: HOB elevated, Max assist, +2 for safety/equipment Sit to supine: Max assist, +2 for physical assistance General bed mobility comments: mod A from PT and OT to come up  into sitting, initiation with RLE noted Transfers Overall transfer level: Needs assistance Equipment used: 2 person hand held assist Transfers: Bed to chair/wheelchair/BSC Sit to Stand: Mod assist, +2 physical assistance Bed to/from chair/wheelchair/BSC transfer type:: Step pivot Stand pivot transfers: +2 safety/equipment, +2 physical assistance, Total assist Squat pivot transfers: Total assist, +2 physical assistance Step pivot transfers: Max assist, +2 safety/equipment General transfer comment: able to move RLE with moderate to max cues and blocking L knee; assist to move L LE Ambulation/Gait General Gait Details: pt unable   ADL: ADL Overall ADL's : Needs assistance/impaired Eating/Feeding: Supervision/ safety Eating/Feeding Details (indicate cue type and reason): able to feed self jello Grooming: Wash/dry hands, Therapist, nutritional, Min guard Grooming Details (indicate cue type and reason): with R hand Toilet Transfer: +2 for physical assistance, +2 for safety/equipment, Stand-pivot, Total assistance Toilet Transfer Details (indicate cue type and reason): Total A of 2 to transition to chair, able to assist with R leg  minimally Toileting- Clothing Manipulation and Hygiene: Total assistance Toileting - Clothing Manipulation Details (indicate cue type and reason): rectal tube and foley currently General ADL Comments: Session focus on sitting EOB, following commands, and increasing overall activity tolerance. Patent remains more alert, and requesting OT rub her neck when sitting EOB (taking OTs hand and placing it on her neck) patient with max cues required to maintain upright sitting position, however noted to have improved ability to maintain head in midline and push neck back into extension with cues from PT. Patient placed in recliner at end of session (no activation noted in LLE with PT advancing).   Cognition: Cognition Overall Cognitive Status: Impaired/Different from baseline Arousal/Alertness: Lethargic Orientation Level: Oriented X4 Attention: Sustained Sustained Attention: Impaired Sustained Attention Impairment: Functional basic, Verbal basic Awareness: Impaired Awareness Impairment: Intellectual impairment Cognition Arousal/Alertness: Awake/alert Behavior During Therapy: Impulsive Overall Cognitive Status: Impaired/Different from baseline Area of Impairment: Attention, Following commands, Safety/judgement, Problem solving, Awareness, Memory Current Attention Level: Sustained Memory: Decreased short-term memory Following Commands: Follows one step commands with increased time, Follows one step commands consistently Safety/Judgement: Decreased awareness of safety, Decreased awareness of deficits Awareness: Intellectual Problem Solving: Slow processing, Decreased initiation, Difficulty sequencing, Requires verbal cues, Requires tactile cues General Comments: Remains more alert, and more appropriate in session, requires cues for impusivity but able to respond to cues with improved reaction time Difficult to assess due to: Level of arousal   Physical Exam: Blood pressure (!) 128/102, pulse 96,  temperature 98.4 F (36.9 C), temperature source Oral, resp. rate 17, height 5\' 4"  (1.626 m), weight 53.2 kg, last menstrual period 04/26/2022, SpO2 99 %. Physical Exam Vitals and nursing note reviewed.  Constitutional:      Comments: Pt asleep- woke briefly, but didn't answer questions; has mitten on R hand, NAD  HENT:     Head:     Comments: Craniotomy site clean and dry with staples intact.  There was some edema around the incision line R raccoon eye    Nose: Nose normal. No congestion.     Mouth/Throat:     Mouth: Mucous membranes are dry.     Pharynx: Oropharynx is clear. No oropharyngeal exudate.  Eyes:     General:        Right eye: No discharge.        Left eye: No discharge.     Comments: Eyes closed- no drainage from either  Cardiovascular:     Rate and Rhythm: Normal rate and regular rhythm.     Heart  sounds: Normal heart sounds. No murmur heard.    No gallop.  Pulmonary:     Effort: Pulmonary effort is normal. No respiratory distress.     Breath sounds: Normal breath sounds. No wheezing, rhonchi or rales.  Abdominal:     General: There is no distension.     Palpations: Abdomen is soft.     Tenderness: There is no abdominal tenderness.     Comments: Honeycomb dressing with flap in place R side of abdomen   Genitourinary:    Comments: Rectal tube in place No foley/purewick Musculoskeletal:     Cervical back: Neck supple.     Comments: Appears that R side is moving - mitten only on R hand No spontaneous movement seen Didn't respond to tickling her hands, feet, etc  Skin:    General: Skin is warm and dry.     Comments: Not wearing SCDs  Neurological:     Comments: Patient is lethargic but arousable.  She would mostly keep her eyes closed during exam.  Follows simple commands.  She was able to provide her age and place. Pt slept for my exam No hoffman's, no clonus seen  Psychiatric:     Comments: sleeping        Lab Results Last 48 Hours        Results for  orders placed or performed during the hospital encounter of 05/31/22 (from the past 48 hour(s))  Sodium     Status: None    Collection Time: 06/20/22 12:49 AM  Result Value Ref Range    Sodium 138 135 - 145 mmol/L      Comment: Performed at [facility], [address], [place], [place] [address]  CBC with Differential/Platelet     Status: Abnormal    Collection Time: 06/20/22  6:14 AM  Result Value Ref Range    WBC 7.7 4.0 - 10.5 K/uL    RBC 2.76 (L) 3.87 - 5.11 MIL/uL    Hemoglobin 8.6 (L) 12.0 - 15.0 g/dL    HCT 60.4 (L) 54.0 - 46.0 %    MCV 92.0 80.0 - 100.0 fL    MCH 31.2 26.0 - 34.0 pg    MCHC 33.9 30.0 - 36.0 g/dL    RDW 98.1 19.1 - 47.8 %    Platelets 550 (H) 150 - 400 K/uL    nRBC 0.0 0.0 - 0.2 %    Neutrophils Relative % 73 %    Neutro Abs 5.6 1.7 - 7.7 K/uL    Lymphocytes Relative 20 %    Lymphs Abs 1.6 0.7 - 4.0 K/uL    Monocytes Relative 5 %    Monocytes Absolute 0.4 0.1 - 1.0 K/uL    Eosinophils Relative 2 %    Eosinophils Absolute 0.2 0.0 - 0.5 K/uL    Basophils Relative 0 %    Basophils Absolute 0.0 0.0 - 0.1 K/uL    Immature Granulocytes 0 %    Abs Immature Granulocytes 0.02 0.00 - 0.07 K/uL      Comment: Performed at [facility], [address], [place], [place] [address]  Basic metabolic panel     Status: Abnormal    Collection Time: 06/20/22  6:14 AM  Result Value Ref Range    Sodium 139 135 - 145 mmol/L    Potassium 2.7 (LL) 3.5 - 5.1 mmol/L      Comment: CRITICAL RESULT CALLED TO, READ BACK BY AND VERIFIED WITH TRACEY HUFF RN.@[id number]  ON 5.6.24 BY  TCALDWELL MT. DELTA CHECK NOTED      Chloride 100 98 - 111 mmol/L    CO2 29 22 - 32 mmol/L    Glucose, Bld 108 (H) 70 - 99 mg/dL      Comment: Glucose reference range applies only to samples taken after fasting for at least 8 hours.    BUN 10 6 - 20 mg/dL    Creatinine, Ser 1.61 0.44 - 1.00 mg/dL    Calcium 8.7 (L) 8.9 - 10.3 mg/dL    GFR, Estimated >09 >60 mL/min      Comment:  (NOTE) Calculated using the CKD-EPI Creatinine Equation (2021)      Anion gap 10 5 - 15      Comment: Performed at [facility], [address], [place], [place] [address]  Sodium     Status: None    Collection Time: 06/20/22  6:14 AM  Result Value Ref Range    Sodium 138 135 - 145 mmol/L      Comment: Performed at [facility], [address] [place], [place] [address]  Basic metabolic panel     Status: None    Collection Time: 06/21/22  4:08 AM  Result Value Ref Range    Sodium 135 135 - 145 mmol/L    Potassium 3.6 3.5 - 5.1 mmol/L    Chloride 98 98 - 111 mmol/L    CO2 27 22 - 32 mmol/L    Glucose, Bld 87 70 - 99 mg/dL      Comment: Glucose reference range applies only to samples taken after fasting for at least 8 hours.    BUN 8 6 - 20 mg/dL    Creatinine, Ser 4.78 0.44 - 1.00 mg/dL    Calcium 9.6 8.9 - 29.5 mg/dL    GFR, Estimated >62 >13 mL/min      Comment: (NOTE) Calculated using the CKD-EPI Creatinine Equation (2021)      Anion gap 10 5 - 15      Comment: Performed at [facility], [address] [place], [place] [address]  Magnesium     Status: None    Collection Time: 06/21/22  4:08 AM  Result Value Ref Range    Magnesium 1.7 1.7 - 2.4 mg/dL      Comment: Performed at [facility], [address], [place], [place] [address]  Phosphorus     Status: Abnormal    Collection Time: 06/21/22  4:08 AM  Result Value Ref Range    Phosphorus 4.8 (H) 2.5 - 4.6 mg/dL      Comment: Performed at [facility], [address] [place], [place] [address]       Imaging Results (Last 48 hours)  VAS [place] TRANSCRANIAL DOPPLER   Result Date: 06/21/2022  Transcranial Doppler Patient Name:  Kristin Simmons  Date of Exam:   06/20/2022 Medical Rec #: [id number]         Accession #:    [id number] Date of Birth: April 20, 1985        Patient Gender: F Patient Age:   79 years Exam Location:  [facility] Procedure:      VAS [place] TRANSCRANIAL DOPPLER Referring Phys:  Lisbeth Renshaw --------------------------------------------------------------------------------  Indications: Subarachnoid hemorrhage. Limitations: Patient movement Performing Technologist: Jean Rosenthal RDMS, RVT  Examination Guidelines: A complete evaluation includes B-mode imaging, spectral Doppler, color Doppler, and power Doppler as needed of all accessible portions of each vessel. Bilateral testing is considered  an integral part of a complete examination. Limited examinations for reoccurring indications may be performed as noted.  +----------+---------------+----------+-----------+------------------+ RIGHT TCD Right VM (cm/s)Depth (cm)Pulsatility     Comment       +----------+---------------+----------+-----------+------------------+ MCA            84.00                  0.75                       +----------+---------------+----------+-----------+------------------+ ACA           -34.00                  0.58                       +----------+---------------+----------+-----------+------------------+ Term ICA                                      Unable to insonate +----------+---------------+----------+-----------+------------------+ PCA P1         52.00                  0.94                       +----------+---------------+----------+-----------+------------------+ Opthalmic      41.00                  0.95                       +----------+---------------+----------+-----------+------------------+ ICA siphon     79.00                  0.58                       +----------+---------------+----------+-----------+------------------+ Vertebral     -43.00                  0.80                       +----------+---------------+----------+-----------+------------------+ Distal ICA     23.00                                             +----------+---------------+----------+-----------+------------------+   +----------+--------------+----------+-----------+-------+ LEFT TCD  Left VM (cm/s)Depth (cm)PulsatilityComment +----------+--------------+----------+-----------+-------+ MCA           104.00       5.4       0.77            +----------+--------------+----------+-----------+-------+ ACA           -37.00                 0.84            +----------+--------------+----------+-----------+-------+ Term ICA      72.00                  0.80            +----------+--------------+----------+-----------+-------+ PCA P1        26.00                  0.98            +----------+--------------+----------+-----------+-------+  Opthalmic     25.00                  0.87            +----------+--------------+----------+-----------+-------+ ICA siphon    57.00                  1.05            +----------+--------------+----------+-----------+-------+ Vertebral     -48.00                 0.71            +----------+--------------+----------+-----------+-------+ Distal ICA    30.00                                  +----------+--------------+----------+-----------+-------+  +------------+-------+-------+             VM cm/sComment +------------+-------+-------+ Prox Basilar-79.00         +------------+-------+-------+ Dist Basilar-124.00        +------------+-------+-------+ +---------+---------------+----------+-----------+------------------+ RIGHT TCDRight VM (cm/s)Depth (cm)Pulsatility     Comment       +---------+---------------+----------+-----------+------------------+ Term ICA                                     Unable to insonate +---------+---------------+----------+-----------+------------------+  Summary:  Mildly elevated left middle cerebral artery mean flow velocities suggest mild vasospasm. slightly elevated right middle cerebral artery mean flow velocities of unclear significance. *See table(s) above for TCD measurements and observations.   Diagnosing physician: Delia Heady MD Electronically signed by Delia Heady MD on 06/21/2022 at 12:01:32 PM.    Final            Blood pressure (!) 128/102, pulse 96, temperature 98.4 F (36.9 C), temperature source Oral, resp. rate 17, height 5\' 4"  (1.626 m), weight 53.2 kg, last menstrual period 04/26/2022, SpO2 99 %.   Medical Problem List and Plan: 1. Functional deficits secondary to incidental findings of right carotid aneurysm.  Status post right craniectomy clipping of middle cerebral artery aneurysm placement of bone flap in subcutaneous abdominal pocket 05/31/2022 per Dr. Conchita Paris complicated by MCA territory edema with hemorrhagic transformation/vasospasm             -patient may  shower if cover incisions             -ELOS/Goals: 18-20 days- supervision to min A 2.  Antithrombotics: -DVT/anticoagulation:  Pharmaceutical: Lovenox initiated 07/04/2022             -antiplatelet therapy: N/A 3. Pain Management: Oxycodone as needed 4. Mood/Behavior/Sleep: Open twice daily as needed provide emotional support             -antipsychotic agents: N/A 5. Neuropsych/cognition: This patient is capable of making decisions on her own behalf. 6. Skin/Wound Care: Routine skin checks 7. Fluids/Electrolytes/Nutrition: Routine in and outs with follow-up chemistries 8.  Hyponatremia.  Continue sodium chloride tablets 9.  Hypotension.  Florinef 0.3 mg 3 times daily 10.  Decreased nutritional storage.  Currently on a dysphagia #3 diet.  Nasogastric tube for nutritional support.  Dietary follow-up 11.  Urinary retention.  Urecholine 5 mg 3 times daily.  Check PVR 12.  History of tobacco/alcohol use.  Counseling   Mcarthur Rossetti Angiulli, PA-C 06/21/2022   I have personally performed a face to face diagnostic evaluation of this patient and formulated  the key components of the plan.  Additionally, I have personally reviewed laboratory data, imaging studies, as well as relevant notes and concur with the physician  assistant's documentation above.   The patient's status has not changed from the original H&P.  Any changes in documentation from the acute care chart have been noted above.

## 2022-06-21 NOTE — TOC Transition Note (Signed)
Transition of Care (TOC) - CM/SW Discharge Note Donn Pierini RN,BSN Transitions of Care Unit 4NP (Non Trauma)- RN Case Manager See Treatment Team for direct Phone #   Patient Details  Name: Kristin Simmons MRN: [id number] Date of Birth: Nov 20, 1985  Transition of Care [facility]) CM/SW Contact:  Darrold Span, RN Phone Number: 06/21/2022, 3:36 PM   Clinical Narrative:    Notified by Julaine Fusi rehab liaison that pt has bed available for admit today. Pt and mom agreeable and pt has been cleared for transition to INPT rehab later today.   No further TOC needs noted at this time.    Final next level of care: IP Rehab Facility Barriers to Discharge: Barriers Resolved   Patient Goals and CMS Choice CMS Medicare.gov Compare Post Acute Care list provided to:: Patient Choice offered to / list presented to : Parent, Patient  Discharge Placement                 [facility] rehab        Discharge Plan and Services Additional resources added to the After Visit Summary for   In-house Referral: NA Discharge Planning Services: CM Consult Post Acute Care Choice: IP Rehab                               Social Determinants of Health (SDOH) Interventions SDOH Screenings   Tobacco Use: High Risk (06/01/2022)     Readmission Risk Interventions    06/21/2022    3:36 PM  Readmission Risk Prevention Plan  Medication Screening Complete  Transportation Screening Complete

## 2022-06-22 ENCOUNTER — Other Ambulatory Visit: Payer: Self-pay

## 2022-06-22 ENCOUNTER — Encounter (HOSPITAL_COMMUNITY): Payer: Self-pay | Admitting: Physical Medicine and Rehabilitation

## 2022-06-22 DIAGNOSIS — I63511 Cerebral infarction due to unspecified occlusion or stenosis of right middle cerebral artery: Secondary | ICD-10-CM | POA: Diagnosis not present

## 2022-06-22 DIAGNOSIS — I609 Nontraumatic subarachnoid hemorrhage, unspecified: Secondary | ICD-10-CM | POA: Diagnosis not present

## 2022-06-22 LAB — BASIC METABOLIC PANEL
Anion gap: 10 (ref 5–15)
BUN: 11 mg/dL (ref 6–20)
CO2: 30 mmol/L (ref 22–32)
Calcium: 9.8 mg/dL (ref 8.9–10.3)
Chloride: 96 mmol/L — ABNORMAL LOW (ref 98–111)
Creatinine, Ser: 0.52 mg/dL (ref 0.44–1.00)
GFR, Estimated: >60 mL/min (ref >60)
Glucose, Bld: 103 mg/dL — ABNORMAL HIGH (ref 70–99)
Potassium: 3.8 mmol/L (ref 3.5–5.1)
Sodium: 136 mmol/L (ref 135–145)

## 2022-06-22 LAB — COMPREHENSIVE METABOLIC PANEL
ALT: 79 U/L — ABNORMAL HIGH (ref 0–44)
AST: 37 U/L (ref 15–41)
Albumin: 3 g/dL — ABNORMAL LOW (ref 3.5–5.0)
Alkaline Phosphatase: 71 U/L (ref 38–126)
Anion gap: 11 (ref 5–15)
BUN: 13 mg/dL (ref 6–20)
CO2: 31 mmol/L (ref 22–32)
Calcium: 9.6 mg/dL (ref 8.9–10.3)
Chloride: 92 mmol/L — ABNORMAL LOW (ref 98–111)
Creatinine, Ser: 0.64 mg/dL (ref 0.44–1.00)
GFR, Estimated: >60 mL/min (ref >60)
Glucose, Bld: 91 mg/dL (ref 70–99)
Potassium: 3.2 mmol/L — ABNORMAL LOW (ref 3.5–5.1)
Sodium: 134 mmol/L — ABNORMAL LOW (ref 135–145)
Total Bilirubin: 0.3 mg/dL (ref 0.3–1.2)
Total Protein: 7.8 g/dL (ref 6.5–8.1)

## 2022-06-22 LAB — CBC WITH DIFFERENTIAL/PLATELET
Abs Immature Granulocytes: 0.02 10*3/uL (ref 0.00–0.07)
Basophils Absolute: 0 10*3/uL (ref 0.0–0.1)
Basophils Relative: 0 %
Eosinophils Absolute: 0.2 10*3/uL (ref 0.0–0.5)
Eosinophils Relative: 3 %
HCT: 31.9 % — ABNORMAL LOW (ref 36.0–46.0)
Hemoglobin: 10.7 g/dL — ABNORMAL LOW (ref 12.0–15.0)
Immature Granulocytes: 0 %
Lymphocytes Relative: 21 %
Lymphs Abs: 1.6 10*3/uL (ref 0.7–4.0)
MCH: 30.8 pg (ref 26.0–34.0)
MCHC: 33.5 g/dL (ref 30.0–36.0)
MCV: 91.9 fL (ref 80.0–100.0)
Monocytes Absolute: 0.4 10*3/uL (ref 0.1–1.0)
Monocytes Relative: 5 %
Neutro Abs: 5.4 10*3/uL (ref 1.7–7.7)
Neutrophils Relative %: 71 %
Platelets: 589 10*3/uL — ABNORMAL HIGH (ref 150–400)
RBC: 3.47 MIL/uL — ABNORMAL LOW (ref 3.87–5.11)
RDW: 14.5 % (ref 11.5–15.5)
WBC: 7.6 10*3/uL (ref 4.0–10.5)
nRBC: 0 % (ref 0.0–0.2)

## 2022-06-22 LAB — GLUCOSE, CAPILLARY
Glucose-Capillary: 123 mg/dL — ABNORMAL HIGH (ref 70–99)
Glucose-Capillary: 108 mg/dL — ABNORMAL HIGH (ref 70–99)
Glucose-Capillary: 86 mg/dL (ref 70–99)
Glucose-Capillary: 78 mg/dL (ref 70–99)

## 2022-06-22 MED ORDER — OSMOLITE 1.5 CAL PO LIQD: 1320.0000 mL | ORAL | Status: DC

## 2022-06-22 MED ORDER — LIDOCAINE 5 % EX PTCH
2.0000 | MEDICATED_PATCH | CUTANEOUS | Status: DC
  Administered 2022-06-22 – 2022-07-02 (×11): 2 via TRANSDERMAL
  Filled 2022-06-22 (×11): qty 2

## 2022-06-22 MED ORDER — HYDROXYZINE HCL 10 MG PO TABS
10.0000 mg | ORAL_TABLET | Freq: Three times a day (TID) | ORAL | Status: DC | PRN
  Administered 2022-06-22 – 2022-06-23 (×3): 10 mg via ORAL
  Filled 2022-06-22 (×3): qty 1

## 2022-06-22 MED ORDER — LIDOCAINE 4 % EX CREA
TOPICAL_CREAM | Freq: Once | CUTANEOUS | Status: AC
  Filled 2022-06-22: qty 5

## 2022-06-22 MED ORDER — FLUDROCORTISONE ACETATE 0.1 MG PO TABS
0.1000 mg | ORAL_TABLET | Freq: Three times a day (TID) | ORAL | Status: DC
  Administered 2022-06-22 – 2022-06-23 (×3): 0.1 mg
  Filled 2022-06-22 (×4): qty 1

## 2022-06-22 MED ORDER — OSMOLITE 1.5 CAL PO LIQD
660.0000 mL | ORAL | Status: DC
  Administered 2022-06-22 – 2022-06-25 (×5): 660 mL
  Filled 2022-06-22 (×2): qty 711

## 2022-06-22 MED ORDER — BANATROL TF EN LIQD
60.0000 mL | Freq: Three times a day (TID) | ENTERAL | Status: DC
  Administered 2022-06-22 – 2022-06-23 (×2): 60 mL
  Filled 2022-06-22 (×2): qty 60

## 2022-06-22 NOTE — Progress Notes (Signed)
Met with patient earlier today, was with speech. Let her know I would come back and meet with her. Went back and patient sleeping. Was given mediations to remove staples.  Will come back later.

## 2022-06-22 NOTE — Progress Notes (Addendum)
Patient ID: Kristin Simmons, female   DOB: [birth year], 37 y.o.   MRN: [id number]  SW spoke with pt mother [name] to introduce self, explain role, and discuss discharge process. No questions/concerns reported at this time. SW will continue to follow-up with updates.   1315-SW went by pt room to complete assessment, but pt sleeping. SW will continue to make efforts to assess, and follow for d/c needs.   Cecile Sheerer, MSW, LCSWA Office: [phone] Cell: [phone] Fax: [phone]

## 2022-06-22 NOTE — Plan of Care (Signed)
Problem: Sit to Stand Goal: LTG:  Patient will perform sit to stand with assistance level (PT) Description: LTG:  Patient will perform sit to stand with assistance level (PT) Flowsheets (Taken 06/22/2022 1550) LTG: PT will perform sit to stand in preparation for functional mobility with assistance level: Contact Guard/Touching assist   Problem: RH Bed Mobility Goal: LTG Patient will perform bed mobility with assist (PT) Description: LTG: Patient will perform bed mobility with assistance, with/without cues (PT). Flowsheets (Taken 06/22/2022 1550) LTG: Pt will perform bed mobility with assistance level of: Contact Guard/Touching assist   Problem: RH Bed to Chair Transfers Goal: LTG Patient will perform bed/chair transfers w/assist (PT) Description: LTG: Patient will perform bed to chair transfers with assistance (PT). Flowsheets (Taken 06/22/2022 1550) LTG: Pt will perform Bed to Chair Transfers with assistance level: Minimal Assistance - Patient > 75%   Problem: RH Car Transfers Goal: LTG Patient will perform car transfers with assist (PT) Description: LTG: Patient will perform car transfers with assistance (PT). Flowsheets (Taken 06/22/2022 1550) LTG: Pt will perform car transfers with assist:: Minimal Assistance - Patient > 75%   Problem: RH Ambulation Goal: LTG Patient will ambulate in home environment (PT) Description: LTG: Patient will ambulate in home environment, # of feet with assistance (PT). Flowsheets (Taken 06/22/2022 1550) LTG: Pt will ambulate in home environ  assist needed:: Minimal Assistance - Patient > 75% LTG: Ambulation distance in home environment: 50' Goal: LTG Patient will ambulate in community environment (PT) Description: LTG: Patient will ambulate in community environment, # of feet with assistance (PT). Flowsheets (Taken 06/22/2022 1550) LTG: Pt will ambulate in community environ  assist needed:: Minimal Assistance - Patient > 75% LTG: Ambulation distance in community  environment: 150'   Problem: RH Wheelchair Mobility Goal: LTG Patient will propel w/c in community environment (PT) Description: LTG: Patient will propel wheelchair in community environment, # of feet with assist (PT) Flowsheets (Taken 06/22/2022 1550) LTG: Pt will propel w/c in community environ  assist needed:: Minimal Assistance - Patient > 75% Distance: wheelchair distance in controlled environment: 150   Problem: RH Stairs Goal: LTG Patient will ambulate up and down stairs w/assist (PT) Description: LTG: Patient will ambulate up and down # of stairs with assistance (PT) Flowsheets (Taken 06/22/2022 1550) LTG: Pt will ambulate up/down stairs assist needed:: Minimal Assistance - Patient > 75% LTG: Pt will  ambulate up and down number of stairs: 12

## 2022-06-22 NOTE — Plan of Care (Signed)
  Problem: RH Swallowing Goal: LTG Patient will consume least restrictive diet using compensatory strategies with assistance (SLP) Description: LTG:  Patient will consume least restrictive diet using compensatory strategies with assistance (SLP) Flowsheets (Taken 06/22/2022 1532) LTG: Pt Patient will consume least restrictive diet using compensatory strategies with assistance of (SLP): Supervision Goal: LTG Pt will demonstrate functional change in swallow as evidenced by bedside/clinical objective assessment (SLP) Description: LTG: Patient will demonstrate functional change in swallow as evidenced by bedside/clinical objective assessment (SLP) Flowsheets (Taken 06/22/2022 1532) LTG: Patient will demonstrate functional change in swallow as evidenced by bedside/clinical objective assessment: Oral swallow   Problem: RH Problem Solving Goal: LTG Patient will demonstrate problem solving for (SLP) Description: LTG:  Patient will demonstrate problem solving for basic/complex daily situations with cues  (SLP) Flowsheets (Taken 06/22/2022 1532) LTG: Patient will demonstrate problem solving for (SLP): Basic daily situations LTG Patient will demonstrate problem solving for: Minimal Assistance - Patient > 75%   Problem: RH Memory Goal: LTG Patient will use memory compensatory aids to (SLP) Description: LTG:  Patient will use memory compensatory aids to recall biographical/new, daily complex information with cues (SLP) Flowsheets (Taken 06/22/2022 1532) LTG: Patient will use memory compensatory aids to (SLP): Supervision   Problem: RH Attention Goal: LTG Patient will demonstrate this level of attention during functional activites (SLP) Description: LTG:  Patient will will demonstrate this level of attention during functional activites (SLP) Flowsheets (Taken 06/22/2022 1532) Patient will demonstrate during cognitive/linguistic activities the attention type of: Selective LTG: Patient will demonstrate this level  of attention during cognitive/linguistic activities with assistance of (SLP): Minimal Assistance - Patient > 75%   Problem: RH Awareness Goal: LTG: Patient will demonstrate awareness during functional activites type of (SLP) Description: LTG: Patient will demonstrate awareness during functional activites type of (SLP) Flowsheets (Taken 06/22/2022 1532) Patient will demonstrate during cognitive/linguistic activities awareness type of: Emergent LTG: Patient will demonstrate awareness during cognitive/linguistic activities with assistance of (SLP): Moderate Assistance - Patient 50 - 74%

## 2022-06-22 NOTE — Progress Notes (Addendum)
PROGRESS NOTE   Subjective/Complaints:  No events obvernight. C/o L shoulder pain this AM. Otherwise, doing well, slept well, trying to eat more.    ROS:  + R shoulder/neck pain Denies fevers, chills, N/V, abdominal pain, constipation, diarrhea, SOB, cough, chest pain, new weakness or paraesthesias.    Objective:   VAS [place] TRANSCRANIAL DOPPLER  Result Date: 06/21/2022  Transcranial Doppler Patient Name:  Kristin Simmons  Date of Exam:   06/20/2022 Medical Rec #: [id number]         Accession #:    [id number] Date of Birth: 12-15-1985        Patient Gender: F Patient Age:   36 years Exam Location:  [facility] Procedure:      VAS [place] TRANSCRANIAL DOPPLER Referring Phys: Lisbeth Renshaw --------------------------------------------------------------------------------  Indications: Subarachnoid hemorrhage. Limitations: Patient movement Performing Technologist: Jean Rosenthal RDMS, RVT  Examination Guidelines: A complete evaluation includes B-mode imaging, spectral Doppler, color Doppler, and power Doppler as needed of all accessible portions of each vessel. Bilateral testing is considered an integral part of a complete examination. Limited examinations for reoccurring indications may be performed as noted.  +----------+---------------+----------+-----------+------------------+ RIGHT TCD Right VM (cm/s)Depth (cm)Pulsatility     Comment       +----------+---------------+----------+-----------+------------------+ MCA            84.00                  0.75                       +----------+---------------+----------+-----------+------------------+ ACA           -34.00                  0.58                       +----------+---------------+----------+-----------+------------------+ Term ICA                                      Unable to insonate +----------+---------------+----------+-----------+------------------+ PCA P1          52.00                  0.94                       +----------+---------------+----------+-----------+------------------+ Opthalmic      41.00                  0.95                       +----------+---------------+----------+-----------+------------------+ ICA siphon     79.00                  0.58                       +----------+---------------+----------+-----------+------------------+ Vertebral     -43.00                  0.80                       +----------+---------------+----------+-----------+------------------+  Distal ICA     23.00                                             +----------+---------------+----------+-----------+------------------+  +----------+--------------+----------+-----------+-------+ LEFT TCD  Left VM (cm/s)Depth (cm)PulsatilityComment +----------+--------------+----------+-----------+-------+ MCA           104.00       5.4       0.77            +----------+--------------+----------+-----------+-------+ ACA           -37.00                 0.84            +----------+--------------+----------+-----------+-------+ Term ICA      72.00                  0.80            +----------+--------------+----------+-----------+-------+ PCA P1        26.00                  0.98            +----------+--------------+----------+-----------+-------+ Opthalmic     25.00                  0.87            +----------+--------------+----------+-----------+-------+ ICA siphon    57.00                  1.05            +----------+--------------+----------+-----------+-------+ Vertebral     -48.00                 0.71            +----------+--------------+----------+-----------+-------+ Distal ICA    30.00                                  +----------+--------------+----------+-----------+-------+  +------------+-------+-------+             VM cm/sComment +------------+-------+-------+ Prox Basilar-79.00          +------------+-------+-------+ Dist Basilar-124.00        +------------+-------+-------+ +---------+---------------+----------+-----------+------------------+ RIGHT TCDRight VM (cm/s)Depth (cm)Pulsatility     Comment       +---------+---------------+----------+-----------+------------------+ Term ICA                                     Unable to insonate +---------+---------------+----------+-----------+------------------+  Summary:  Mildly elevated left middle cerebral artery mean flow velocities suggest mild vasospasm. slightly elevated right middle cerebral artery mean flow velocities of unclear significance. *See table(s) above for TCD measurements and observations.  Diagnosing physician: Delia Heady MD Electronically signed by Delia Heady MD on 06/21/2022 at 12:01:32 PM.    Final    Recent Labs    06/20/22 0614 06/22/22 0640  WBC 7.7 7.6  HGB 8.6* 10.7*  HCT 25.4* 31.9*  PLT 550* 589*   Recent Labs    06/21/22 0408 06/22/22 0640  NA 135 134*  K 3.6 3.2*  CL 98 92*  CO2 27 31  GLUCOSE 87 91  BUN 8 13  CREATININE 0.46 0.64  CALCIUM 9.6 9.6    Intake/Output Summary (Last 24 hours)  at 06/22/2022 0843 Last data filed at 06/22/2022 0309 Gross per 24 hour  Intake 283.25 ml  Output --  Net 283.25 ml        Physical Exam: Vital Signs Blood pressure (!) 151/114, pulse 92, temperature 98 F (36.7 C), temperature source Oral, resp. rate 18, height 5\' 4"  (1.626 m), weight 53.2 kg, last menstrual period 04/26/2022, SpO2 100 %. Constitutional: No apparent distress. Appropriate appearance for age.  HENT: No JVD. Neck Supple. Trachea midline. + R craniotomy site- swollen, fluctuant and nontender Eyes: PERRLA. EOMI. Visual fields grossly intact.  Cardiovascular: RRR, no murmurs/rub/gallops. No Edema. Peripheral pulses 2+  Respiratory: CTAB. No rales, rhonchi, or wheezing. On RA.  Abdomen: + bowel sounds, normoactive. No distention or tenderness. + rectal tube with liquid  stool + Cortrak GU: Not examined.   Skin: R craniectomy and R abdominal surgical sites with staples - well-approximated, c/d/I.  MSK:      No apparent deformity.      Strength:                RUE: 5/5 SA, 5/5 EF, 5/5 EE, 5/5 WE, 5/5 FF, 5/5 FA                 LUE: Flaccid 0/5                RLE: 5/5 HF, 5/5 KE, 5/5 DF, 5/5 EHL, 5/5 PF                 LLE:  1/5 HF, otherwise 0/5  Neurologic exam:  Cognition: AAO to person, place, time. Language: Fluent, No substitutions or neoglisms. No dysarthria.  Insight: Good insight into current condition.  Mood: Pleasant affect, appropriate mood.  Sensation: Equal and intact in BL UE and Les.  Reflexes: 2+ in R UE and LE; LUE and LLE hyporeflexic. Negative Hoffman's and babinski signs bilaterally.  CN: + L tongue deviation Coordination: No apparent tremors.  Spasticity: MAS 0 in all extremities.    Assessment/Plan: 1. Functional deficits which require 3+ hours per day of interdisciplinary therapy in a comprehensive inpatient rehab setting. Physiatrist is providing close team supervision and 24 hour management of active medical problems listed below. Physiatrist and rehab team continue to assess barriers to discharge/monitor patient progress toward functional and medical goals  Care Tool:  Bathing              Bathing assist       Upper Body Dressing/Undressing Upper body dressing        Upper body assist      Lower Body Dressing/Undressing Lower body dressing            Lower body assist       Toileting Toileting    Toileting assist       Transfers Chair/bed transfer  Transfers assist           Locomotion Ambulation   Ambulation assist              Walk 10 feet activity   Assist           Walk 50 feet activity   Assist           Walk 150 feet activity   Assist           Walk 10 feet on uneven surface  activity   Assist           Wheelchair     Assist  Wheelchair 50 feet with 2 turns activity    Assist            Wheelchair 150 feet activity     Assist          Blood pressure (!) 151/114, pulse 92, temperature 98 F (36.7 C), temperature source Oral, resp. rate 18, height 5\' 4"  (1.626 m), weight 53.2 kg, last menstrual period 04/26/2022, SpO2 100 %.  1. 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 Conchita Paris. Bone flap in abdominal pocket, plan for subacute flap replacement after IPR             -patient may  shower if cover incisions             -ELOS/Goals: 18-20 days- supervision to min A   - Safety precautions due to poor impulse control - No helmet in room; will order for OOB 5/8  2.  Antithrombotics: -DVT/anticoagulation:  Pharmaceutical: Lovenox initiated 07/04/2022             -antiplatelet therapy: N/A  3. Pain Management: Oxycodone as needed  5/8: added lidocaine patch x2 to L neck, shoulder  4. Mood/Behavior/Sleep: PRN twice daily as needed provide emotional support             -antipsychotic agents: N/A  - Sleep log added  5. Neuropsych/cognition: This patient is capable of making decisions on her own behalf.   - Obs for safety  6. Skin/Wound Care: Routine skin checks  - 5/8: 3 weeks post-R craniectomy; ordered staple removal from scalp and abdomen. no apparent wounds will DC rectal tube  7. Fluids/Electrolytes/Nutrition/Dysphagia: Routine in and outs with follow-up chemistries Diet Orders (From admission, onward)     Start     Ordered   06/22/22 0845  DIET DYS 3 Room service appropriate? Yes with Assist; Fluid consistency: Thin; Fluid restriction: 1500 mL Fluid  Diet effective now       Comments: Meds whole with liquid; full supervision with meals  Question Answer Comment  Room service appropriate? Yes with Assist   Fluid consistency: Thin   Fluid  restriction: 1500 mL Fluid      06/22/22 0844            8.  Hyponatremia.  Continue sodium chloride tablets  - 5/8: Na 134 this AM. Downtrending over last 2 days. No obvious medication contributors; Already on salt tabs 2g TID. Placed 1500 ml fluid restriction, repeat today 138. Ordered daily BMP x3 days for monitoring.   9.  Hypotension.  Florinef 0.3 mg 3 times daily  5/8: HTN 150s/110s on florinef 0.3 mg TID. Has been elevated recently. Reduce to Florinef 0.1 mg TID and monitor. Daily orthostatic vitals for 3x days.       06/22/2022    1:59 PM 06/22/2022    6:33 AM 06/22/2022   12:44 AM  Vitals with BMI  Height   5\' 4"   Weight   117 lbs 5 oz  BMI   20.12  Systolic 153 151   Diastolic 107 114   Pulse 93 92     10.  Decreased nutritional storage.  Currently on a dysphagia #3 diet.  Nasogastric tube for nutritional support.  Dietary follow-up  11.  Urinary retention.  Urecholine 5 mg 3 times daily.  Check PVR  5/8: No PVRs overnight; ordered. Mixed continence overnight.   12.  History of tobacco/alcohol  use.  Counseling  13. Diarrhea. Remove rectal tube 5/8. Increase banatrol to TID.     LOS: 1 days A FACE TO FACE EVALUATION WAS PERFORMED  Angelina Sheriff 06/22/2022, 8:43 AM

## 2022-06-22 NOTE — Progress Notes (Signed)
Orthopedic Tech Progress Note Patient Details:  MCCAYLA KELLOUGH [birth year] [id number]  Called in order to [employer] for a SAFETY HELMET   Patient ID: HALLEY OLIVAS, female   DOB: 02-20-1985, 37 y.o.   MRN: [id number]  [name] 06/22/2022, 5:41 PM

## 2022-06-22 NOTE — Evaluation (Signed)
Occupational Therapy Assessment and Plan  Patient Details  Name: Kristin Simmons MRN: [id number] Date of Birth: 07-Sep-1985  OT Diagnosis: acute pain, apraxia, cognitive deficits, disturbance of vision, hemiplegia affecting non-dominant side, and muscle weakness (generalized) Rehab Potential: Rehab Potential (ACUTE ONLY): Good ELOS: 3.5-4 weeks   Today's Date: 06/22/2022 OT Individual Time: 4098-1191 OT Individual Time Calculation (min): 60 min     Hospital Problem: Principal Problem:   SAH (subarachnoid hemorrhage) (HCC) Active Problems:   Right internal carotid artery aneurysm   Past Medical History:  Past Medical History:  Diagnosis Date   Anemia    Anxiety    Arthritis    Asthma    Cancer (HCC)    cervical   Depression    Headache    Hypertension    She was taking her mother's blood pressure meds but states she doesn't need bp meds now.   Vaginal Pap smear, abnormal    Past Surgical History:  Past Surgical History:  Procedure Laterality Date   CRANIOTOMY Right 05/31/2022   Procedure: RIGHT PTERIONAL CRANIOTOMY FOR CLIPPING OF MIDDLE CEREBRAL ARTERY  ANEURYSM WITH PLACEMENT OF BONEFLAP IN ABDOMEN;  Surgeon: Lisbeth Renshaw, MD;  Location: [facility] OR;  Service: Neurosurgery;  Laterality: Right;   IR 3D INDEPENDENT WKST  03/08/2022   IR ANGIO INTRA EXTRACRAN SEL INTERNAL CAROTID BILAT MOD SED  03/08/2022   IR ANGIO VERTEBRAL SEL VERTEBRAL BILAT MOD SED  03/08/2022   IR ANGIOGRAM FOLLOW UP STUDY  03/08/2022   IR ANGIOGRAM FOLLOW UP STUDY  03/08/2022   IR ANGIOGRAM FOLLOW UP STUDY  03/08/2022   IR NEURO EACH ADD'L AFTER BASIC UNI RIGHT (MS)  03/08/2022   IR TRANSCATH/EMBOLIZ  03/08/2022   NO PAST SURGERIES     RADIOLOGY WITH ANESTHESIA N/A 03/08/2022   Procedure: Diagnostic angiogram, Possible Coil Embolization of Aneurysm;  Surgeon: Lisbeth Renshaw, MD;  Location: [facility];  Service: Radiology;  Laterality: N/A;   TUBAL LIGATION Bilateral 03/27/2018   Procedure: POST PARTUM TUBAL  LIGATION;  Surgeon: Levie Heritage, DO;  Location: [facility];  Service: Gynecology;  Laterality: Bilateral;    Assessment & Plan Clinical Impression:  Pt is a 37yo female who was admitted for elective coiling of R MCA aneurysm however this was complicated by intraoperative rupture resulting in a R pterional craniectomy for clipping of MCA aneurysm. Placement of bone flap in subcutaneous abdominal pocket. PMH: anxiety, anemia, HTN, depression   Patient currently requires total with basic self-care skills secondary to muscle weakness, decreased cardiorespiratoy endurance, impaired timing and sequencing, abnormal tone, unbalanced muscle activation, motor apraxia, and decreased coordination, decreased visual perceptual skills, left side neglect, decreased motor planning, and ideational apraxia, decreased initiation, decreased attention, decreased awareness, decreased problem solving, decreased safety awareness, decreased memory, and delayed processing, and decreased sitting balance, decreased standing balance, decreased postural control, hemiplegia, and decreased balance strategies.  Prior to hospitalization, patient could complete BADL/IADL with independent .  Patient will benefit from skilled intervention to decrease level of assist with basic self-care skills and increase independence with basic self-care skills prior to discharge home with care partner.  Anticipate patient will require 24 hour supervision and moderate physical assestance and follow up home health.  OT - End of Session Activity Tolerance: Tolerates 30+ min activity with multiple rests Endurance Deficit: Yes OT Assessment Rehab Potential (ACUTE ONLY): Good OT Barriers to Discharge: Decreased caregiver support;Home environment access/layout;Inaccessible home environment;Behavior OT Patient demonstrates impairments in the following area(s): Balance;Behavior;Cognition;Endurance;Motor;Pain;Perception;Sensory;Vision;Skin  Integrity;Nutrition;Safety OT  Basic ADL's Functional Problem(s): Grooming;Bathing;Toileting;Dressing OT Transfers Functional Problem(s): Toilet;Tub/Shower OT Additional Impairment(s): Fuctional Use of Upper Extremity OT Plan OT Intensity: Minimum of 1-2 x/day, 45 to 90 minutes OT Frequency: 5 out of 7 days OT Duration/Estimated Length of Stay: 3.5-4 weeks OT Treatment/Interventions: Balance/vestibular training;Discharge planning;Functional electrical stimulation;Pain management;Self Care/advanced ADL retraining;Therapeutic Activities;UE/LE Coordination activities;Visual/perceptual remediation/compensation;Therapeutic Exercise;Skin care/wound managment;Patient/family education;Functional mobility training;Disease mangement/prevention;Cognitive remediation/compensation;Community reintegration;DME/adaptive equipment instruction;Neuromuscular re-education;Psychosocial support;Splinting/orthotics;UE/LE Strength taining/ROM;Wheelchair propulsion/positioning OT Self Feeding Anticipated Outcome(s): S OT Basic Self-Care Anticipated Outcome(s): MIN OT Toileting Anticipated Outcome(s): MIN OT Bathroom Transfers Anticipated Outcome(s): MIN OT Recommendation Patient destination: Home Follow Up Recommendations: Outpatient OT Equipment Recommended: Tub/shower bench;3 in 1 bedside comode;To be determined   OT Evaluation Precautions/Restrictions  Precautions Precautions: Fall Precaution Comments: R craniectomy, no bone flap, bone flap placed in abdomen, R hand mitt, coretrak, flexiseal Restrictions Weight Bearing Restrictions: No General Chart Reviewed: Yes OT Amount of Missed Time: 15 Minutes Family/Caregiver Present: No Vital Signs  Pain Pain Assessment Pain Score: 0-No pain Home Living/Prior Functioning Home Living Family/patient expects to be discharged to:: Private residence Living Arrangements: Children Available Help at Discharge: Family, Available 24 hours/day Type of Home: House Home  Access: Level entry Home Layout: Bed/bath upstairs, Two level Alternate Level Stairs-Number of Steps: 13 Bathroom Shower/Tub: Engineer, manufacturing systems: Standard Additional Comments: no bathroom on ground level, has 5 daughters ages 73 to 4  Lives With: Family Prior Function Level of Independence: Independent with basic ADLs, Independent with homemaking with ambulation 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 Vision Baseline Vision/History: 0 No visual deficits Ability to See in Adequate Light: 1 Impaired Patient Visual Report:  (unable to report) Vision Assessment?: Vision impaired- to be further tested in functional context;Yes Ocular Range of Motion: Restricted on the left;Restricted looking down;Restricted looking up Alignment/Gaze Preference: Gaze right;Within Defined Limits Tracking/Visual Pursuits: Decreased smoothness of vertical tracking;Decreased smoothness of horizontal tracking Additional Comments: pt with difficutly following commands but able to track to midline and across to L 2x functionally at sink Perception  Perception: Impaired Inattention/Neglect: Does not attend to left visual field;Does not attend to left side of body Praxis Praxis: Impaired Praxis Impairment Details: Initiation;Motor planning Cognition Cognition Overall Cognitive Status: Impaired/Different from baseline Arousal/Alertness: Lethargic Orientation Level: Person;Place;Situation Person: Oriented Place: Disoriented Situation: Disoriented Awareness: Impaired Awareness Impairment: Intellectual impairment Brief Interview for Mental Status (BIMS) Repetition of Three Words (First Attempt): 3 Temporal Orientation: Year: Correct Temporal Orientation: Month: Missed by more than 1 month Temporal Orientation: Day: Incorrect Recall: "Sock": No, could not recall Recall: "Blue": No, could not recall Recall: "Bed": No, could not recall BIMS Summary Score: 6 Sensation Sensation Light Touch: Impaired by gross  assessment Proprioception: Impaired by gross assessment Coordination Gross Motor Movements are Fluid and Coordinated: No Fine Motor Movements are Fluid and Coordinated: No Coordination and Movement Description: flaccid L hemi Motor  Motor Motor: Hemiplegia;Abnormal tone;Abnormal postural alignment and control  Trunk/Postural Assessment  Cervical Assessment Cervical Assessment: Exceptions to [facility] (head forward, gaze R) Thoracic Assessment Thoracic Assessment: Exceptions to [facility] (rounded shoulders) Lumbar Assessment Lumbar Assessment: Exceptions to [employer] (post pelvic tile) Postural Control Postural Control: Deficits on evaluation Head Control: gaze R, delayed adjustment Trunk Control: severly delayed Righting Reactions: inadequate Protective Responses: unable L  Balance Balance Balance Assessed: Yes Dynamic Sitting Balance Dynamic Sitting - Balance Support: During functional activity Dynamic Sitting - Level of Assistance: 3: Mod assist Static Standing Balance Static Standing - Balance Support: During functional activity Static Standing - Level of Assistance: 2: Max assist  Dynamic Standing Balance Dynamic Standing - Balance Support: During functional activity Dynamic Standing - Level of Assistance: 1: +1 Total assist;1: +2 Total assist Extremity/Trunk Assessment RUE Assessment RUE Assessment: Within Functional Limits LUE Assessment LUE Assessment: Exceptions to [facility] General Strength Comments: flaccid L hemi  Care Tool Care Tool Self Care Eating        Oral Care    Oral Care Assist Level: Maximal assistance - Patient 25 - 49%    Bathing   Body parts bathed by patient: Left arm;Chest;Abdomen;Right upper leg Body parts bathed by helper: Right arm;Front perineal area;Buttocks;Left upper leg;Right lower leg;Left lower leg;Face   Assist Level: 2 Helpers    Upper Body Dressing(including orthotics)   What is the patient wearing?: Pull over shirt   Assist Level: Total  Assistance - Patient < 25%    Lower Body Dressing (excluding footwear)   What is the patient wearing?: Pants Assist for lower body dressing: Total Assistance - Patient < 25%    Putting on/Taking off footwear   What is the patient wearing?: Non-skid slipper socks Assist for footwear: Total Assistance - Patient < 25%       Care Tool Toileting Toileting activity   Assist for toileting: Total Assistance - Patient < 25%     Care Tool Bed Mobility Roll left and right activity        Sit to lying activity        Lying to sitting on side of bed activity         Care Tool Transfers Sit to stand transfer        Chair/bed transfer         Toilet transfer   Assist Level: Maximal Assistance - Patient 24 - 49%     Care Tool Cognition  Expression of Ideas and Wants Expression of Ideas and Wants: 3. Some difficulty - exhibits some difficulty with expressing needs and ideas (e.g, some words or finishing thoughts) or speech is not clear  Understanding Verbal and Non-Verbal Content Understanding Verbal and Non-Verbal Content: 2. Sometimes understands - understands only basic conversations or simple, direct phrases. Frequently requires cues to understand   Memory/Recall Ability     Refer to Care Plan for Long Term Goals  SHORT TERM GOAL WEEK 1 OT Short Term Goal 1 (Week 1): Pt will sit EOM with MIN-MOD A for 5 min during funcitoanl task in prep for BADL OT Short Term Goal 2 (Week 1): Pt will locate items on L of sink with mod cuing OT Short Term Goal 3 (Week 1): Pt will verbalize 1 step of hemi dressing technique OT Short Term Goal 4 (Week 1): pt will demo improved awareness/decreased impulsivity by waiting for OT to transfer wiht no more than min cuing throughout session  Recommendations for other services: None    Skilled Therapeutic Intervention ADL ADL Grooming: Maximal assistance Where Assessed-Grooming: Sitting at sink Upper Body Bathing: Maximal assistance Where  Assessed-Upper Body Bathing: Sitting at sink Lower Body Bathing: Dependent Where Assessed-Lower Body Bathing: Sitting at sink;Standing at sink Upper Body Dressing: Dependent Where Assessed-Upper Body Dressing: Sitting at sink Lower Body Dressing: Dependent Where Assessed-Lower Body Dressing: Sitting at sink;Standing at sink Toileting: Dependent Toilet Transfer: Dependent Toilet Transfer Method: Other (comment) (stedy +2; or MOD A +2) Tub/Shower Transfer: Unable to assess Mobility  Transfers Sit to Stand: Maximal Assistance - Patient 25-49% Stand to Sit: Maximal Assistance - Patient 25-49%  1:1. Pt educated on OT role/purpose, CIR, ELOS, and BI recovery.  Pt initially refusing tx, bu [place] ot OT coming back in 20 min. OT retrieves TIS for pt to better fit than reclining back. And returns after 15 min and pt begrudingly agreeable to get OOB. Pt very gruff with OT stating, "I want to punch you for waking me up" and "I need something to drink-something to add to that juice." Pt disoriented to place and situation stating "theres nothing wrong with me" and "where's marcus? You know him you introduced [place]." Pt BP elevated after MAX A transfer (+2 present for safety d/t impulsivity)--155/110. Pt grooms at sink with A to use LUE functionally. Pt reporting unable to feel on L side. Pt reporting neck pain and requesting to get back in bed. Transfer back to bed insame manner after pt impulsively tries ot get up on her own. Edu re saebo stim, estim and NMR. Placed stim one on her L deltoid with good contraction.   Saebo Stim One 330 pulse width 35 Hz pulse rate On 8 sec/ off 8 sec Ramp up/ down 2 sec Symmetrical Biphasic Kristin form  Max intensity at 500 Ohm load  Exited session with pt seated in bed, exit alarm on and call light in reach     Discharge Criteria: Patient will be discharged from OT if patient refuses treatment 3 consecutive times without medical reason, if treatment goals not  met, if there is a change in medical status, if patient makes no progress towards goals or if patient is discharged from hospital.  The above assessment, treatment plan, treatment alternatives and goals were discussed and mutually agreed upon: by patient  Shon Hale 06/22/2022, 12:19 PM

## 2022-06-22 NOTE — Progress Notes (Signed)
Inpatient Rehabilitation  Patient information reviewed and entered into eRehab system by  M. , M.A., CCC/SLP, PPS Coordinator.  Information including medical coding, functional ability and quality indicators will be reviewed and updated through discharge.    

## 2022-06-22 NOTE — Evaluation (Signed)
Speech Language Pathology Assessment and Plan  Patient Details  Name: Kristin Simmons MRN: [id number] Date of Birth: November 17, 1985  SLP Diagnosis: Cognitive Impairments;Dysphagia  Rehab Potential: Good ELOS: 3-4 weeks    Today's Date: 06/22/2022 SLP Individual Time: 4098-1191 SLP Individual Time Calculation (min): 55 min   Hospital Problem: Principal Problem:   SAH (subarachnoid hemorrhage) (HCC) Active Problems:   Right internal carotid artery aneurysm  Past Medical History:  Past Medical History:  Diagnosis Date   Anemia    Anxiety    Arthritis    Asthma    Cancer (HCC)    cervical   Depression    Headache    Hypertension    She was taking her mother's blood pressure meds but states she doesn't need bp meds now.   Vaginal Pap smear, abnormal    Past Surgical History:  Past Surgical History:  Procedure Laterality Date   CRANIOTOMY Right 05/31/2022   Procedure: RIGHT PTERIONAL CRANIOTOMY FOR CLIPPING OF MIDDLE CEREBRAL ARTERY  ANEURYSM WITH PLACEMENT OF BONEFLAP IN ABDOMEN;  Surgeon: Lisbeth Renshaw, MD;  Location: [facility] OR;  Service: Neurosurgery;  Laterality: Right;   IR 3D INDEPENDENT WKST  03/08/2022   IR ANGIO INTRA EXTRACRAN SEL INTERNAL CAROTID BILAT MOD SED  03/08/2022   IR ANGIO VERTEBRAL SEL VERTEBRAL BILAT MOD SED  03/08/2022   IR ANGIOGRAM FOLLOW UP STUDY  03/08/2022   IR ANGIOGRAM FOLLOW UP STUDY  03/08/2022   IR ANGIOGRAM FOLLOW UP STUDY  03/08/2022   IR NEURO EACH ADD'L AFTER BASIC UNI RIGHT (MS)  03/08/2022   IR TRANSCATH/EMBOLIZ  03/08/2022   NO PAST SURGERIES     RADIOLOGY WITH ANESTHESIA N/A 03/08/2022   Procedure: Diagnostic angiogram, Possible Coil Embolization of Aneurysm;  Surgeon: Lisbeth Renshaw, MD;  Location: [facility] OR;  Service: Radiology;  Laterality: N/A;   TUBAL LIGATION Bilateral 03/27/2018   Procedure: POST PARTUM TUBAL LIGATION;  Surgeon: Levie Heritage, DO;  Location: [facility];  Service: Gynecology;  Laterality: Bilateral;     Assessment / Plan / Recommendation Clinical Impression Patient is a 37 year old right-handed female with history of hypertension as well as tobacco/alcohol use. Patient with recent trip to the emergency department for some dental pain. As part of her workup in the emergency department she underwent maxillofacial CT with contrast which incidentally discovered a right carotid aneurysm. She was referred to neurosurgery. Presented 05/31/2022 for elective right craniectomy for clipping of middle cerebral artery aneurysm placement of bone flap and subcutaneous abdominal pocket 05/31/2022 per Dr. Conchita Paris. Hospital course patient experienced intraoperative rupture requiring mechanical ventilation with follow-up per critical care medicine. Developed MCA territory edema with hemorrhagic transformation. On 4/29 she experienced a change in mental status showed severe evidence of severe vasospasm. CT angiogram showed evolving large right MCA territory infarction with herniation of tissue off of the right craniectomy more pronounced on prior CT. New area of hypodensity in the right parietal lobe consistent with recent infarct as well as rightward midline shift. She was transferred back to the ICU for hemodynamic augmentation and maintained on 3% NaCl.. Developed hyponatremia 123 as well as treated for haemophilus pneumonia and placed on sodium chloride tablets with latest sodium 135. Responded to normal saline . Bouts of hypokalemia with supplement added. Echocardiogram with ejection fraction of 70 to 75% grade 1 diastolic dysfunction. Patient was slowly extubated. Currently on a mechanical soft thin liquid diet as well as nasogastric tube feeds for nutritional support.. She was cleared to begin Lovenox for DVT  prophylaxis 06/19/2022. Bouts of urinary retention placed on low-dose Urecholine. Blood pressure remains soft placed on Florinef. Therapy evaluations completed due to patient's decreased functional mobility with  recommendations for a comprehensive rehab program. Patient admitted 06/21/22.   Upon arrival, patient was awake and alert while upright in bed. Patient was independently oriented to place, situation and month/year but unable to verbalize any physical or cognitive impairments. Patient with poor attention to tasks and was verbose with verbal expression consisting of language of confusion and confabulations. Patient with an obvious left inattention but able to turn to the left with Mod verbal cues. Patient unable to complete a standardized cognitive assessment at this time but demonstrated severe cognitive impairments requiring overall Max-Total A to complete functional and familiar tasks safely.  Patient consumed large, consecutive sips of thin liquids via straw without overt s/s of of aspiration noted. Patient with mildly prolonged mastication with intermittent verbal cues needed to attend to bolus. Patient with mild left anterior spillage/buccal pocketing that cleared with verbal cues. Recommend patient continue current diet of Dys. 3 textures with thin liquids with full supervision to maximize safety.   Patient would benefit from skilled SLP intervention to maximize her cognitive and swallowing function prior to discharge.    Skilled Therapeutic Interventions          Administered a cognitive-linguistic and swallowing evaluation, please see above for detail.   SLP Assessment  Patient will need skilled Speech Lanaguage Pathology Services during CIR admission    Recommendations  SLP Diet Recommendations: Dysphagia 3 (Mech soft);Thin Liquid Administration via: Cup;Straw Medication Administration: Whole meds with liquid Supervision: Staff to assist with self feeding;Full supervision/cueing for compensatory strategies;Patient able to self feed Compensations: Minimize environmental distractions;Lingual sweep for clearance of pocketing;Monitor for anterior loss;Small sips/bites;Slow rate Postural Changes  and/or Swallow Maneuvers: Seated upright 90 degrees Oral Care Recommendations: Oral care BID Recommendations for Other Services: Neuropsych consult Patient destination: Home Follow up Recommendations: 24 hour supervision/assistance Equipment Recommended: None recommended by SLP    SLP Frequency 1 to 3 out of 7 days   SLP Duration  SLP Intensity  SLP Treatment/Interventions 3-4 weeks  Minumum of 1-2 x/day, 30 to 90 minutes  Cognitive remediation/compensation;Cueing hierarchy;Environmental controls;Functional tasks;Internal/external aids;Patient/family education;Therapeutic Activities    Pain Pain Assessment Pain Score: 0-No pain  Prior Functioning Type of Home: House  Lives With: Family Available Help at Discharge: Family;Available 24 hours/day 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  SLP Evaluation Cognition Overall Cognitive Status: Impaired/Different from baseline Arousal/Alertness: Awake/alert Orientation Level: Oriented to person;Oriented to place;Oriented to time;Oriented to situation Attention: Sustained Sustained Attention: Impaired Sustained Attention Impairment: Functional basic;Verbal basic Memory: Impaired Memory Impairment: Decreased recall of new information;Decreased short term memory Awareness: Impaired Awareness Impairment: Intellectual impairment Problem Solving: Impaired Problem Solving Impairment: Verbal basic;Functional basic Executive Function:  (all impaired due to lower level deficits) Safety/Judgment: Impaired  Comprehension Auditory Comprehension Overall Auditory Comprehension: Impaired Conversation: Simple Interfering Components: Attention;Processing speed;Working Theatre manager: Not tested Reading Comprehension Reading Status: Not tested Expression Expression Primary Mode of Expression: Verbal Verbal Expression Overall Verbal Expression: Impaired Initiation: No impairment Automatic Speech: Name;Social  Response Level of Generative/Spontaneous Verbalization: Phrase Repetition: No impairment Naming: No impairment Pragmatics: Impairment Impairments: Turn Taking;Topic appropriateness;Eye contact Interfering Components: Attention Written Expression Dominant Hand: Right Written Expression: Not tested Oral Motor Oral Motor/Sensory Function Overall Oral Motor/Sensory Function: Mild impairment Facial Symmetry: Abnormal symmetry left Lingual Strength: Reduced Motor Speech Overall Motor Speech: Appears within functional limits for tasks assessed  Care Tool  Care Tool Cognition Ability to hear (with hearing aid or hearing appliances if normally used Ability to hear (with hearing aid or hearing appliances if normally used): 1. Minimal difficulty - difficulty in some environments (e.g. when person speaks softly or setting is noisy)   Expression of Ideas and Wants Expression of Ideas and Wants: 3. Some difficulty - exhibits some difficulty with expressing needs and ideas (e.g, some words or finishing thoughts) or speech is not clear   Understanding Verbal and Non-Verbal Content Understanding Verbal and Non-Verbal Content: 3. Usually understands - understands most conversations, but misses some part/intent of message. Requires cues at times to understand  Memory/Recall Ability Memory/Recall Ability : That he or she is in a hospital/hospital unit    Bedside Swallowing Assessment General Date of Onset: 05/31/22 Previous Swallow Assessment: BSE, eventually placed on Dys. 3 textures with thin liquids Diet Prior to this Study: Dysphagia 3 (mechanical soft);Cortrak/Small bore NG tube;Thin liquids (Level 0) Temperature Spikes Noted: No Respiratory Status: Room air History of Recent Intubation: Yes Total duration of intubation (days): 4 days Date extubated: 06/04/22 Behavior/Cognition: Alert;Cooperative;Confused;Requires cueing Oral Cavity - Dentition: Adequate natural dentition Self-Feeding  Abilities: Able to feed self;Needs set up;Needs assist Patient Positioning: Upright in bed Baseline Vocal Quality: Normal Volitional Cough: Weak Volitional Swallow: Able to elicit  Oral Care Assessment Lips: Symmetrical Teeth: Missing (Comment) Tongue: Pink;Moist Mucous Membrane(s): Moist;Pink Ice Chips Ice chips: Not tested Thin Liquid Thin Liquid: Within functional limits Presentation: Straw Nectar Thick Nectar Thick Liquid: Not tested Honey Thick Honey Thick Liquid: Not tested Puree Puree: Within functional limits Presentation: Spoon;Self Fed Solid Solid: Impaired Oral Phase Functional Implications: Impaired mastication BSE Assessment Risk for Aspiration Impact on safety and function: Mild aspiration risk Other Related Risk Factors: Lethargy;Cognitive impairment  Short Term Goals: Week 1: SLP Short Term Goal 1 (Week 1): Patient will consume current diet with minimal overt s/s of aspiration with supervision supervision level verbal cues for use of swallowing compensatory strategies. SLP Short Term Goal 2 (Week 1): Patient will demonstrate sustained attention to functional tasks for 10 minutes with Mod verbal cues for redirection. SLP Short Term Goal 3 (Week 1): Patient will identify 2 physical and 2 cognitive deficits with Max verbal cues. SLP Short Term Goal 4 (Week 1): Patient will attend to left visual field during functional tasks with Mod A multimodal cues. SLP Short Term Goal 5 (Week 1): Patient will demonstrate functional problem solving for basic and familiar tasks with Mod verbal cues.  Refer to Care Plan for Long Term Goals  Recommendations for other services: Neuropsych  Discharge Criteria: Patient will be discharged from SLP if patient refuses treatment 3 consecutive times without medical reason, if treatment goals not met, if there is a change in medical status, if patient makes no progress towards goals or if patient is discharged from hospital.  The above  assessment, treatment plan, treatment alternatives and goals were discussed and mutually agreed upon: by patient  ,  06/22/2022, 3:25 PM

## 2022-06-22 NOTE — Progress Notes (Signed)
Patient awake in bed. Staples removed per order. Patient requested pain medications and numbing cream prior to removing staples. Staples removed, patient tolerated well. Rectal tube removed per order. Buttock CDI.

## 2022-06-22 NOTE — Discharge Summary (Signed)
Physician Discharge Summary  Patient ID: Kristin Simmons MRN: [id number] DOB/AGE: Oct 14, 1985 37 y.o.  Admit date: 05/31/2022 Discharge date: 06/22/2022  Admission Diagnoses:  Cerebral aneurysm  Discharge Diagnoses:  Same Principal Problem:   Status post craniotomy Active Problems:   SAH (subarachnoid hemorrhage) (HCC)   Brain bleed [facility])   Discharged Condition: Stable  Hospital Course:  Kristin Simmons is a 37 y.o. female with previous incidental diagnosis of multiple intracranial aneurysms.  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  She was found to harbor an irregular right middle cerebral artery aneurysm at the same time.  She was therefore brought in for elective right middle cerebral artery aneurysm clipping.  Unfortunately during the initial portion of the procedure, she had an intraoperative rupture with severe diffuse brain swelling.  We were able to complete the procedure and surgically clipped the right middle cerebral artery aneurysm.  Due to the amount of swelling we elected to implant the bone flap in the abdomen.  Patient was taken to the intensive care unit initially extubated.  Postoperatively she was noted to have left hemiplegia, but was arousable.  She began to improve from a level of consciousness standpoint.  She became interactive verbally after extubation.  She worked with physical and Occupational Therapy.  She did have episodes of decreased responsiveness and did undergo long-term EEG monitoring without any evidence for seizures.  She was also monitored with transcranial Doppler and CT angiogram suggesting possibility of proximal right M1 spasm.  She was treated with hyperdynamic therapy and remained stable.  She was ultimately weaned off of vasopressor support.  She was also found to have significant hyponatremia and was treated with a combination of salt tabs and hypertonic saline.  Ultimately, her sodium normalized and  remained stable.  She was then transferred to the general neuroscience floor after approximately 3 weeks of observation in the intensive care unit.  She continued to work with physical and Occupational Therapy and was seen by physiatry physician and deemed good candidate for comprehensive inpatient rehabilitation.  She was therefore discharged in stable hemodynamic and neurologic condition.  Plan would be for replacement of her bone flap in a subacute fashion after her rehabilitation stay.  Treatments: Surgery - Right MCA aneurysm clipping  Discharge Exam: Blood pressure (!) 128/102, pulse 96, temperature 98.4 F (36.9 C), temperature source Oral, resp. rate 17, height 5\' 4"  (1.626 m), weight 53.2 kg, last menstrual period 04/26/2022, SpO2 99 %. Awake, alert Speech fluent, appropriate CN grossly intact 5/5 RUE/RLE No voluntary movement LUE/LLE Wound c/d/I Flap somewhat tense  Disposition: Discharge disposition: [facility]-Rehab Facility       Discharge Instructions     Call MD for:  redness, tenderness, or signs of infection (pain, swelling, redness, odor or green/yellow discharge around incision site)   Complete by: As directed    Call MD for:  temperature >100.4   Complete by: As directed    Discharge instructions   Complete by: As directed    Walk at home as much as possible, at least 4 times / day   Increase activity slowly   Complete by: As directed    May shower / Bathe   Complete by: As directed    48 hours after surgery   No dressing needed   Complete by: As directed       Allergies as of 06/21/2022       Reactions   Latex Itching, Rash  Medication List     TAKE these medications    acetaminophen 325 MG tablet Commonly known as: TYLENOL Take 2 tablets (650 mg total) by mouth every 4 (four) hours as needed for mild pain (temp > 100.5).   amLODipine 10 MG tablet Commonly known as: NORVASC Take 1 tablet (10 mg total) by mouth daily.   bethanechol 5 MG  tablet Commonly known as: URECHOLINE Place 1 tablet (5 mg total) into feeding tube 3 (three) times daily.   enoxaparin 30 MG/0.3ML injection Commonly known as: LOVENOX Inject 0.3 mLs (30 mg total) into the skin daily.   fiber supplement (BANATROL TF) liquid Place 60 mLs into feeding tube 2 (two) times daily.   fludrocortisone 0.1 MG tablet Commonly known as: FLORINEF Place 3 tablets (0.3 mg total) into feeding tube 3 (three) times daily.   folic acid 1 MG tablet Commonly known as: FOLVITE Place 1 tablet (1 mg total) into feeding tube daily.   lactobacillus Pack Place 1 packet (1 g total) into feeding tube 3 (three) times daily with meals.   loperamide HCl 1 MG/7.5ML suspension Commonly known as: IMODIUM Place 15 mLs (2 mg total) into feeding tube as needed for diarrhea or loose stools.   naproxen sodium 220 MG tablet Commonly known as: ALEVE Take 220 mg by mouth daily as needed (in the AM).   oxyCODONE 5 MG immediate release tablet Commonly known as: Oxy IR/ROXICODONE Place 1 tablet (5 mg total) into feeding tube every 4 (four) hours as needed for moderate pain.   thiamine 100 MG tablet Commonly known as: Vitamin B-1 Place 1 tablet (100 mg total) into feeding tube daily.   triamterene-hydrochlorothiazide 37.5-25 MG tablet Commonly known as: MAXZIDE-25 Take 1 tablet by mouth daily.               Discharge Care Instructions  (From admission, onward)           Start     Ordered   06/21/22 0000  No dressing needed        06/21/22 1442            Follow-up Information     Lisbeth Renshaw, MD Follow up.   Specialty: Neurosurgery Contact information: [address] [place] [address] [phone]                 Signed: Jackelyn Hoehn 06/22/2022, 9:49 AM

## 2022-06-22 NOTE — Discharge Instructions (Addendum)
Inpatient Rehab Discharge Instructions  [name] Discharge date and time: No discharge date for patient encounter.   Activities/Precautions/ Functional Status: Activity: activity as tolerated Diet: Mechanical soft Wound Care: Routine skin checks Functional status:  ___ No restrictions     ___ Walk up steps independently ___ 24/7 supervision/assistance   ___ Walk up steps with assistance ___ Intermittent supervision/assistance  ___ Bathe/dress independently ___ Walk with walker     __x_ Bathe/dress with assistance ___ Walk Independently    ___ Shower independently ___ Walk with assistance    ___ Shower with assistance ___ No alcohol     ___ Return to work/school ________  Special Instructions:  No driving smoking or alcohol        COMMUNITY REFERRALS UPON DISCHARGE:    Outpatient: PT     OT   ST                 Agency: [facility]     Phone: [phone]             Appointment Date/Time: *Please expect follow-up within 7-10 business days to schedule your appointment. If  you have not received follow-up, be sure to contact the site directly.*  Medical Equipment/Items Ordered: tub transfer bench, 3in1 bedside commode, and wheelchair                                                 Agency/Supplier: [employer] [phone]  GENERAL COMMUNITY RESOURCES FOR PATIENT/FAMILY:  1) A PCS (personal care services) referral was submitted on your behalf to [facility] [phone]. A referral will be sent out to the following home care agencies in this order: 1) [facility], 2) [facility] and 3) [facility]. Be sure to follow-up to check the status of your referral.   2) CAP/DA referral was submitted. Appropriate referral was faxed to [facility] after referral packet was received. Be sure to contact [facility] with regard to status of the referral.    My questions have been answered and I understand these instructions. I will adhere to these  goals and the provided educational materials after my discharge from the hospital.  Patient/Caregiver Signature _______________________________ Date __________  Clinician Signature _______________________________________ Date __________  Please bring this form and your medication list with you to all your follow-up doctor's appointments.

## 2022-06-22 NOTE — Progress Notes (Signed)
Physical Therapy Assessment and Plan  Patient Details  Name: Kristin Simmons MRN: [id number] Date of Birth: [birth year]  PT Diagnosis: Abnormality of gait, Cognitive deficits, Coordination disorder, Difficulty walking, Hemiplegia non-dominant, Impaired cognition, Impaired sensation, and Muscle weakness Rehab Potential: Good ELOS: 3-4 weeks   Today's Date: 06/22/2022 PT Individual Time: 0801-0915 PT Individual Time Calculation (min): 74 min    Hospital Problem: Principal Problem:   SAH (subarachnoid hemorrhage) (HCC) Active Problems:   Right internal carotid artery aneurysm   Past Medical History:  Past Medical History:  Diagnosis Date   Anemia    Anxiety    Arthritis    Asthma    Cancer (HCC)    cervical   Depression    Headache    Hypertension    She was taking her mother's blood pressure meds but states she doesn't need bp meds now.   Vaginal Pap smear, abnormal    Past Surgical History:  Past Surgical History:  Procedure Laterality Date   CRANIOTOMY Right 05/31/2022   Procedure: RIGHT PTERIONAL CRANIOTOMY FOR CLIPPING OF MIDDLE CEREBRAL ARTERY  ANEURYSM WITH PLACEMENT OF BONEFLAP IN ABDOMEN;  Surgeon: Lisbeth Renshaw, MD;  Location: [facility] OR;  Service: Neurosurgery;  Laterality: Right;   IR 3D INDEPENDENT WKST  03/08/2022   IR ANGIO INTRA EXTRACRAN SEL INTERNAL CAROTID BILAT MOD SED  03/08/2022   IR ANGIO VERTEBRAL SEL VERTEBRAL BILAT MOD SED  03/08/2022   IR ANGIOGRAM FOLLOW UP STUDY  03/08/2022   IR ANGIOGRAM FOLLOW UP STUDY  03/08/2022   IR ANGIOGRAM FOLLOW UP STUDY  03/08/2022   IR NEURO EACH ADD'L AFTER BASIC UNI RIGHT (MS)  03/08/2022   IR TRANSCATH/EMBOLIZ  03/08/2022   NO PAST SURGERIES     RADIOLOGY WITH ANESTHESIA N/A 03/08/2022   Procedure: Diagnostic angiogram, Possible Coil Embolization of Aneurysm;  Surgeon: Lisbeth Renshaw, MD;  Location: [facility] OR;  Service: Radiology;  Laterality: N/A;   TUBAL LIGATION Bilateral 03/27/2018   Procedure: POST PARTUM TUBAL  LIGATION;  Surgeon: Levie Heritage, DO;  Location: [facility];  Service: Gynecology;  Laterality: Bilateral;    Assessment & Plan Clinical Impression: Patient is a 37 year old right-handed female with history of hypertension as well as tobacco/alcohol use. Patient with recent trip to the emergency department for some dental pain. As part of her workup in the emergency department she underwent maxillofacial CT with contrast which incidentally discovered a right carotid aneurysm. She was referred to neurosurgery. Presented 05/31/2022 for elective right craniectomy for clipping of middle cerebral artery aneurysm placement of bone flap and subcutaneous abdominal pocket 05/31/2022 per Dr. Conchita Paris. Hospital course patient experienced intraoperative rupture requiring mechanical ventilation with follow-up per critical care medicine. Developed MCA territory edema with hemorrhagic transformation. On 4/29 she experienced a change in mental status showed severe evidence of severe vasospasm. CT angiogram showed evolving large right MCA territory infarction with herniation of tissue off of the right craniectomy more pronounced on prior CT. New area of hypodensity in the right parietal lobe consistent with recent infarct as well as rightward midline shift. She was transferred back to the ICU for hemodynamic augmentation and maintained on 3% NaCl.. Developed hyponatremia 123 as well as treated for haemophilus pneumonia and placed on sodium chloride tablets with latest sodium 135. Responded to normal saline . Bouts of hypokalemia with supplement added. Echocardiogram with ejection fraction of 70 to 75% grade 1 diastolic dysfunction. Patient was slowly extubated. Currently on a mechanical soft thin liquid diet as well as nasogastric  tube feeds for nutritional support.. She was cleared to begin Lovenox for DVT prophylaxis 06/19/2022. Bouts of urinary retention placed on low-dose Urecholine. Blood pressure remains soft  placed on Florinef. Therapy evaluations completed due to patient's decreased functional mobility was admitted for a comprehensive rehab program.   Patient currently requires total with mobility secondary to muscle weakness, decreased cardiorespiratoy endurance, impaired timing and sequencing, abnormal tone, decreased coordination, and decreased motor planning, decreased visual perceptual skills, decreased attention to left and decreased motor planning, decreased initiation, decreased attention, decreased awareness, decreased problem solving, decreased safety awareness, decreased memory, and delayed processing, and decreased sitting balance, decreased standing balance, hemiplegia, and decreased balance strategies.  Prior to hospitalization, patient was independent  with mobility and lived with Family in a House home.  Home access is  Level entry.  Patient will benefit from skilled PT intervention to maximize safe functional mobility, minimize fall risk, and decrease caregiver burden for planned discharge home with 24 hour assist.  Anticipate patient will benefit from follow up OP at discharge.  PT - End of Session Activity Tolerance: Tolerates 30+ min activity with multiple rests Endurance Deficit: Yes Endurance Deficit Description: Global deconditioning with poor endurance/activity tolerance PT Assessment Rehab Potential (ACUTE/IP ONLY): Good PT Barriers to Discharge: Home environment access/layout;Insurance for SNF coverage;Wound Care;Inaccessible home environment;Behavior;Nutrition means PT Patient demonstrates impairments in the following area(s): Balance;Perception;Behavior;Safety;Sensory;Endurance;Skin Integrity;Motor;Nutrition;Pain PT Transfers Functional Problem(s): Bed Mobility;Bed to Chair;Car PT Locomotion Functional Problem(s): Ambulation;Wheelchair Mobility;Stairs PT Plan PT Intensity: Minimum of 1-2 x/day ,45 to 90 minutes PT Frequency: 5 out of 7 days PT Duration Estimated Length of  Stay: 3-4 weeks PT Treatment/Interventions: Ambulation/gait training;Community reintegration;DME/adaptive equipment instruction;Neuromuscular re-education;Psychosocial support;Stair training;UE/LE Strength taining/ROM;Wheelchair propulsion/positioning;Balance/vestibular training;Discharge planning;Functional electrical stimulation;Pain management;Skin care/wound management;Therapeutic Activities;UE/LE Coordination activities;Cognitive remediation/compensation;Disease management/prevention;Functional mobility training;Patient/family education;Splinting/orthotics;Therapeutic Exercise;Visual/perceptual remediation/compensation PT Transfers Anticipated Outcome(s): MinA with LRAD PT Locomotion Anticipated Outcome(s): MinA with LRAD for household distances PT Recommendation Recommendations for Other Services: Other (comment) (TBD) Follow Up Recommendations: Outpatient PT (TBD, pending progress) Patient destination: Home Equipment Recommended: To be determined Equipment Details: TBD   PT Evaluation Precautions/Restrictions Precautions Precautions: Fall Precaution Comments: R craniectomy, no bone flap, bone flap placed in abdomen, R hand mitt, coretrak, flexiseal Restrictions Weight Bearing Restrictions: No General   Vital Signs Pain Pain Assessment Pain Score: 0-No pain Pain Interference Pain Interference Pain Effect on Sleep: 3. Frequently Pain Interference with Therapy Activities: 3. Frequently Pain Interference with Day-to-Day Activities: 3. Frequently Home Living/Prior Functioning Home Living Available Help at Discharge: Family;Available 24 hours/day Type of Home: House Home Access: Level entry Home Layout: Bed/bath upstairs;Two level Alternate Level Stairs-Number of Steps: 13 Bathroom Shower/Tub: Engineer, manufacturing systems: Standard Additional Comments: no bathroom on ground level, has 5 daughters ages 44 to 4  Lives With: Family Prior Function Level of Independence:  Independent with basic ADLs;Independent with homemaking with ambulation;Independent with gait;Independent with transfers  Able to Take Stairs?: Yes Driving: Yes 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 Vision/Perception  Vision - History Ability to See in Adequate Light: 1 Impaired Vision - Assessment Ocular Range of Motion: Restricted on the left;Restricted looking down;Restricted looking up Alignment/Gaze Preference: Gaze right;Within Defined Limits Tracking/Visual Pursuits: Decreased smoothness of vertical tracking;Decreased smoothness of horizontal tracking Additional Comments: Patient able to track to midline and across to her L side in order to locate therapist throughout functional mobility tasks Perception Perception: Impaired Inattention/Neglect: Does not attend to left visual field;Does not attend to left side of body Praxis Praxis: Impaired Praxis Impairment Details: Initiation;Motor planning  Cognition Overall Cognitive Status: Impaired/Different from baseline Arousal/Alertness: Lethargic Orientation Level: Oriented to person;Oriented to place Sustained Attention: Impaired Awareness: Impaired Awareness Impairment: Intellectual impairment Problem Solving: Impaired Safety/Judgment: Impaired Sensation Sensation Light Touch: Impaired by gross assessment Proprioception: Impaired by gross assessment Coordination Gross Motor Movements are Fluid and Coordinated: No Fine Motor Movements are Fluid and Coordinated: No Coordination and Movement Description: flaccid L hemi Motor  Motor Motor: Hemiplegia;Abnormal tone;Abnormal postural alignment and control Motor - Skilled Clinical Observations: Flaccid L UE/LE   Trunk/Postural Assessment  Cervical Assessment Cervical Assessment: Exceptions to [facility] (Forward head with downward gaze to the R) Thoracic Assessment Thoracic Assessment: Exceptions to [facility] (Rounded shoulders) Lumbar Assessment Lumbar Assessment: Exceptions to [facility] (Posterior pelvic  tilt) Postural Control Postural Control: Deficits on evaluation Head Control: Forward head with downward and to the R gaze, delayed adjustment Trunk Control: Delayed and inadequate Righting Reactions: Delayed and inadequate Protective Responses: Absent  Balance Balance Balance Assessed: Yes Static Sitting Balance Static Sitting - Balance Support: Feet supported;Right upper extremity supported Static Sitting - Level of Assistance: 4: Min assist Dynamic Sitting Balance Dynamic Sitting - Balance Support: During functional activity Dynamic Sitting - Level of Assistance: 3: Mod assist Static Standing Balance Static Standing - Balance Support: Right upper extremity supported Static Standing - Level of Assistance: 2: Max assist Dynamic Standing Balance Dynamic Standing - Balance Support: During functional activity Dynamic Standing - Level of Assistance: 1: +2 Total assist Extremity Assessment  RUE Assessment RUE Assessment: Within Functional Limits LUE Assessment LUE Assessment: Exceptions to [facility] General Strength Comments: flaccid L hemi RLE Assessment RLE Assessment: Within Functional Limits General Strength Comments: Grossly 4/5 LLE Assessment LLE Assessment: Exceptions to [facility] General Strength Comments: Grossly 1/5 with trace activation noted  Care Tool Care Tool Bed Mobility Roll left and right activity   Roll left and right assist level: Total Assistance - Patient < 25%    Sit to lying activity   Sit to lying assist level: Maximal Assistance - Patient 25 - 49%    Lying to sitting on side of bed activity   Lying to sitting on side of bed assist level: the ability to move from lying on the back to sitting on the side of the bed with no back support.: Maximal Assistance - Patient 25 - 49%     Care Tool Transfers Sit to stand transfer   Sit to stand assist level: Maximal Assistance - Patient 25 - 49%    Chair/bed transfer   Chair/bed transfer assist level: Maximal  Assistance - Patient 25 - 49%     Toilet transfer   Assist Level: Maximal Assistance - Patient 24 - 49%    Car transfer   Car transfer assist level: Maximal Assistance - Patient 25 - 49%      Care Tool Locomotion Ambulation   Assist level: 2 helpers Assistive device: Other (comment) (Hemi-rail) Max distance: 30'  Walk 10 feet activity   Assist level: 2 helpers     Walk 50 feet with 2 turns activity Walk 50 feet with 2 turns activity did not occur: Safety/medical concerns (Patient unable to ambulate >30' at this time secondary to fatigue, poor endurance/activity tolerance, impaired attention, increased pain, etc.)      Walk 150 feet activity Walk 150 feet activity did not occur: Safety/medical concerns      Walk 10 feet on uneven surfaces activity Walk 10 feet on uneven surfaces activity did not occur: Safety/medical concerns      Stairs Stair  activity did not occur: Safety/medical concerns        Walk up/down 1 step activity Walk up/down 1 step or curb (drop down) activity did not occur: Safety/medical concerns      Walk up/down 4 steps activity Walk up/down 4 steps activity did not occur: Safety/medical concerns      Walk up/down 12 steps activity Walk up/down 12 steps activity did not occur: Safety/medical concerns      Pick up small objects from floor Pick up small object from the floor (from standing position) activity did not occur: Safety/medical concerns      Wheelchair Is the patient using a wheelchair?: Yes Type of Wheelchair:  (TIS/Recline back wheelchair)   Wheelchair assist level: Dependent - Patient 0% Max wheelchair distance: 150'  Wheel 50 feet with 2 turns activity   Assist Level: Dependent - Patient 0%  Wheel 150 feet activity   Assist Level: Dependent - Patient 0%    Refer to Care Plan for Long Term Goals  SHORT TERM GOAL WEEK 1 PT Short Term Goal 1 (Week 1): Patient will perform bed mobility with MinA PT Short Term Goal 2 (Week 1): Patient  will perform bed/chair transfer with ModA x1 PT Short Term Goal 3 (Week 1): Patient will gait train x50' with LRAD and MaxA x1 PT Short Term Goal 4 (Week 1): Patient will be able to tolerate sitting upright in TIS wheelchair between treatment sessions  Recommendations for other services: None   Skilled Therapeutic Intervention Mobility Bed Mobility Bed Mobility: Rolling Right;Rolling Left;Supine to Sit;Sit to Supine Rolling Right: Total Assistance - Patient < 25% Rolling Left: Maximal Assistance - Patient 25-49% Supine to Sit: Maximal Assistance - Patient - Patient 25-49% Sit to Supine: Maximal Assistance - Patient 25-49% Transfers Transfers: Sit to Stand;Stand to Sit;Stand Pivot Transfers Sit to Stand: Maximal Assistance - Patient 25-49% Stand to Sit: Maximal Assistance - Patient 25-49% Stand Pivot Transfers: Maximal Assistance - Patient 25 - 49% Stand Pivot Transfer Details: Manual facilitation for weight shifting;Manual facilitation for placement;Verbal cues for sequencing;Verbal cues for precautions/safety;Verbal cues for technique Stand Pivot Transfer Details (indicate cue type and reason): Second person present for safety, however no physical assistance Transfer (Assistive device): 1 person hand held assist Locomotion  Gait Ambulation: Yes Gait Assistance: 2 Helpers Gait Distance (Feet): 30 Feet Assistive device: Other (Comment) (Hemi-rail) Gait Assistance Details: Tactile cues for initiation;Tactile cues for placement;Verbal cues for technique;Verbal cues for gait pattern;Manual facilitation for weight shifting;Manual facilitation for placement;Manual facilitation for weight bearing Gait Gait: Yes Gait Pattern: Impaired Gait Pattern: Trunk flexed;Poor foot clearance - left;Decreased stance time - left (L knee buckles in stance) Stairs / Additional Locomotion Stairs: No Wheelchair Mobility Wheelchair Mobility: Yes Wheelchair Assistance: Dependent - Patient 0% Wheelchair  Parts Management: Needs assistance Distance: 150'  Skilled Intervention- Patient greeted supine in bed with mother initially present and both agreeable to PT treatment session. Evaluation completed (see details above and below) with education on PT POC and goals and individual treatment initiated with focus on bed mobility, sitting balance, transfers, gait, standing balance, awareness, L attention, cognition, etc. Patient completed bed mobility with MaxA and VC throughout for sequencing. While sitting EOB, patient required CGA/ModA for sitting balance with multimodal cues for improved midline posturing secondary to laterla and posterior lean with forward flexed head, downward gaze with R preference. Patient performed stand pivot transfer to wheelchair with MaxA and therapist blocking L knee throughout stance and advancing during swing phase. Patient gait trained x15' and  x30' with R HR and MaxA with second therapist providing a wc follow for safety. Therapist providing multimodal cues throughout gait trial for improved postural extension and forward gaze. Therapist facilitating L LE advancement during swing phase of gait, as well as blocking L knee during stance phase secondary to heavy buckling. Patient transferred back to recliner at end of session with posey belt on, legs elevated, call bell within reach, RN and NT notified of patient positioning and all needs met.    Discharge Criteria: Patient will be discharged from PT if patient refuses treatment 3 consecutive times without medical reason, if treatment goals not met, if there is a change in medical status, if patient makes no progress towards goals or if patient is discharged from hospital.  The above assessment, treatment plan, treatment alternatives and goals were discussed and mutually agreed upon: by patient  [place]   06/22/2022, 1:01 PM

## 2022-06-22 NOTE — Progress Notes (Signed)
Initial Nutrition Assessment  DOCUMENTATION CODES:   Not applicable  INTERVENTION:  Modify to cyclic feeds of Osmolite 1.5 to 55 ml/hr (660 ml per day) Prosource TF20 60 ml daily Provides 1070 kcal, 61 gm protein, 502 ml free water daily No FWF at this time.   Continue Banatrol TID.   NUTRITION DIAGNOSIS:   Inadequate oral intake related to poor appetite as evidenced by meal completion < 50%.  GOAL:   Patient will meet greater than or equal to 90% of their needs  MONITOR:   PO intake, TF tolerance  REASON FOR ASSESSMENT:   Consult Enteral/tube feeding initiation and management  ASSESSMENT:   37 y.o. female admits to CIR related to functional deficits in setting of s/p right craniectomy. PMH includes: asthma, cancer, HTN, cervical cancer.  Meds reviewed:  folic acid, sliding scale insulin, thiamine. Labs reviewed: Na low, K low.   Pt was sleeping at time of assessment. Pt currently has a Dys 3, thin liquid, 1500 mL diet. Per RN, the pt ate 50% of both her breakfast and lunch today. RD will modify pt to cyclic feeds that meet 50% of her needs. Will continue to monitor PO intakes and adjust TF as needed. Discussed changes with RN.   NUTRITION - FOCUSED PHYSICAL EXAM:  Reassess at follow up.   Diet Order:   Diet Order             DIET DYS 3 Room service appropriate? Yes with Assist; Fluid consistency: Thin; Fluid restriction: 1500 mL Fluid  Diet effective now                   EDUCATION NEEDS:   Not appropriate for education at this time  Skin:  Skin Assessment: Reviewed RN Assessment  Last BM:  5/7  Height:   Ht Readings from Last 1 Encounters:  06/22/22 5\' 4"  (1.626 m)    Weight:   Wt Readings from Last 1 Encounters:  06/22/22 53.2 kg    Ideal Body Weight:     BMI:  Body mass index is 20.13 kg/m.  Estimated Nutritional Needs:   Kcal:  1800-2000  Protein:  85-100  Fluid:  > 1.8 L/day  Bethann Humble, RD, LDN, CNSC.

## 2022-06-23 ENCOUNTER — Inpatient Hospital Stay (HOSPITAL_COMMUNITY): Payer: Medicaid Other

## 2022-06-23 DIAGNOSIS — I609 Nontraumatic subarachnoid hemorrhage, unspecified: Secondary | ICD-10-CM | POA: Diagnosis not present

## 2022-06-23 DIAGNOSIS — I63511 Cerebral infarction due to unspecified occlusion or stenosis of right middle cerebral artery: Secondary | ICD-10-CM | POA: Diagnosis not present

## 2022-06-23 LAB — BASIC METABOLIC PANEL
Anion gap: 6 (ref 5–15)
BUN: 13 mg/dL (ref 6–20)
CO2: 30 mmol/L (ref 22–32)
Calcium: 9.5 mg/dL (ref 8.9–10.3)
Chloride: 101 mmol/L (ref 98–111)
Creatinine, Ser: 0.62 mg/dL (ref 0.44–1.00)
GFR, Estimated: >60 mL/min (ref >60)
Glucose, Bld: 106 mg/dL — ABNORMAL HIGH (ref 70–99)
Potassium: 4.1 mmol/L (ref 3.5–5.1)
Sodium: 137 mmol/L (ref 135–145)

## 2022-06-23 LAB — GLUCOSE, CAPILLARY
Glucose-Capillary: 92 mg/dL (ref 70–99)
Glucose-Capillary: 97 mg/dL (ref 70–99)
Glucose-Capillary: 84 mg/dL (ref 70–99)
Glucose-Capillary: 93 mg/dL (ref 70–99)

## 2022-06-23 MED ORDER — TRAZODONE HCL 50 MG PO TABS: 50.0000 mg | ORAL_TABLET | Freq: Every evening | ORAL | Status: DC | PRN

## 2022-06-23 MED ORDER — MELATONIN 5 MG PO TABS
5.0000 mg | ORAL_TABLET | Freq: Every day | ORAL | Status: DC
  Administered 2022-06-23 – 2022-06-29 (×7): 5 mg via ORAL
  Filled 2022-06-23 (×7): qty 1

## 2022-06-23 MED ORDER — GABAPENTIN 300 MG PO CAPS
300.0000 mg | ORAL_CAPSULE | Freq: Three times a day (TID) | ORAL | Status: DC
  Administered 2022-06-23 – 2022-06-24 (×3): 300 mg via ORAL
  Filled 2022-06-23 (×3): qty 1

## 2022-06-23 MED ORDER — FLUDROCORTISONE ACETATE 0.1 MG PO TABS
0.1000 mg | ORAL_TABLET | Freq: Three times a day (TID) | ORAL | Status: DC
  Administered 2022-06-23 – 2022-06-24 (×3): 0.1 mg via ORAL
  Filled 2022-06-23 (×4): qty 1

## 2022-06-23 MED ORDER — BANATROL TF EN LIQD
60.0000 mL | Freq: Two times a day (BID) | ENTERAL | Status: DC
  Administered 2022-06-23 – 2022-06-24 (×2): 60 mL
  Filled 2022-06-23 (×2): qty 60

## 2022-06-23 MED ORDER — ENOXAPARIN SODIUM 40 MG/0.4ML IJ SOSY
40.0000 mg | PREFILLED_SYRINGE | INTRAMUSCULAR | Status: DC
  Administered 2022-06-24 – 2022-07-19 (×26): 40 mg via SUBCUTANEOUS
  Filled 2022-06-23 (×27): qty 0.4

## 2022-06-23 NOTE — Progress Notes (Signed)
Inpatient Rehabilitation Care Coordinator Assessment and Plan Patient Details  Name: Kristin Simmons MRN: [id number] Date of Birth: Jun 17, 1985  Today's Date: 06/23/2022  Hospital Problems: Principal Problem:   SAH (subarachnoid hemorrhage) (HCC) Active Problems:   Right internal carotid artery aneurysm  Past Medical History:  Past Medical History:  Diagnosis Date   Anemia    Anxiety    Arthritis    Asthma    Cancer (HCC)    cervical   Depression    Headache    Hypertension    She was taking her mother's blood pressure meds but states she doesn't need bp meds now.   Vaginal Pap smear, abnormal    Past Surgical History:  Past Surgical History:  Procedure Laterality Date   CRANIOTOMY Right 05/31/2022   Procedure: RIGHT PTERIONAL CRANIOTOMY FOR CLIPPING OF MIDDLE CEREBRAL ARTERY  ANEURYSM WITH PLACEMENT OF BONEFLAP IN ABDOMEN;  Surgeon: Lisbeth Renshaw, MD;  Location: [facility] OR;  Service: Neurosurgery;  Laterality: Right;   IR 3D INDEPENDENT WKST  03/08/2022   IR ANGIO INTRA EXTRACRAN SEL INTERNAL CAROTID BILAT MOD SED  03/08/2022   IR ANGIO VERTEBRAL SEL VERTEBRAL BILAT MOD SED  03/08/2022   IR ANGIOGRAM FOLLOW UP STUDY  03/08/2022   IR ANGIOGRAM FOLLOW UP STUDY  03/08/2022   IR ANGIOGRAM FOLLOW UP STUDY  03/08/2022   IR NEURO EACH ADD'L AFTER BASIC UNI RIGHT (MS)  03/08/2022   IR TRANSCATH/EMBOLIZ  03/08/2022   NO PAST SURGERIES     RADIOLOGY WITH ANESTHESIA N/A 03/08/2022   Procedure: Diagnostic angiogram, Possible Coil Embolization of Aneurysm;  Surgeon: Lisbeth Renshaw, MD;  Location: [facility] OR;  Service: Radiology;  Laterality: N/A;   TUBAL LIGATION Bilateral 03/27/2018   Procedure: POST PARTUM TUBAL LIGATION;  Surgeon: Levie Heritage, DO;  Location: [facility];  Service: Gynecology;  Laterality: Bilateral;   Social History:  reports that she has been smoking cigarettes. She has a 7.50 pack-year smoking history. She has never used smokeless tobacco. She reports current  alcohol use of about 14.0 standard drinks of alcohol per week. She reports that she does not use drugs.  Family / Support Systems Marital Status: Single Patient Roles: Parent Spouse/Significant Other: N/a Children: 5 children- ages 46-17 y.o. Other Supports: mother, aunt, cousin Anticipated Caregiver: children, mother, aunt, cousin Ability/Limitations of Caregiver: Pt mother works 3rd shift and only available during the day. Pt will have support from children, aunt, and cousin. Caregiver Availability: 24/7 Family Dynamics: Pt lives alone with her 5 children.  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 Education: 9th grade Health Literacy - How often do you need to have someone help you when you read instructions, pamphlets, or other written material from your doctor or pharmacy?: Never Writes: Yes Employment Status: Unemployed Date Retired/Disabled/Unemployed: N/A Marine scientist Issues: Denies Guardian/Conservator: N/A   Abuse/Neglect Abuse/Neglect Assessment Can Be Completed: Yes Physical Abuse: Denies Verbal Abuse: Denies Sexual Abuse: Denies Exploitation of patient/patient's resources: Denies Self-Neglect: Denies  Patient response to: Social Isolation - How often do you feel lonely or isolated from those around you?: Never  Emotional Status Pt's affect, behavior and adjustment status: Pt tired at time of visit, and in/out of sleep with limited enagement. Recent Psychosocial Issues: Denies Psychiatric History: Pt mother reports pt has always had trouble with getting to sleep but no mental health diagnosis. SW discussed with pt if she feels anxious often. Unable to elaborate further on what is  the cause. Substance Abuse History: Pt admits to smoking cigarettes daily- 1pk; 1 beer per night to help with sleep; denies rec drug use.  Patient / Family Perceptions, Expectations &  Goals Pt/Family understanding of illness & functional limitations: Pt mother has general understanding of care needs Premorbid pt/family roles/activities: Independent Anticipated changes in roles/activities/participation: Assistance with ADLs/IADLs Pt/family expectations/goals: pt goal is to work om "my strength, my eye (R) hurts, has prescription glass she uses as well at times." Pt mother would like for her to "get to walking and be able to do for her and her kids."  [employer]: None Premorbid Home Care/DME Agencies: None Transportation available at discharge: TBD Is the patient able to respond to transportation needs?: Yes In the past 12 months, has lack of transportation kept you from medical appointments or from getting medications?: No In the past 12 months, has lack of transportation kept you from meetings, work, or from getting things needed for daily living?: No Resource referrals recommended: Neuropsychology  Discharge Planning Living Arrangements: Children, Alone Support Systems: Children, Other relatives, Parent Type of Residence: Private residence Insurance Resources: [employer] (specify county) ([place]) Surveyor, quantity Resources: Family Support Financial Screen Referred: No Living Expenses: Psychologist, sport and exercise Management: Family Does the patient have any problems obtaining your medications?: No Home Management: Pt manages homecare needs; pt mother pays for apartment. Patient/Family Preliminary Plans: TBD Care Coordinator Barriers to Discharge: Insurance for SNF coverage, Decreased caregiver support, Lack of/limited family support Care Coordinator Anticipated Follow Up Needs: HH/OP Expected length of stay: 3.5-4 weeks  Clinical Impression SW met with pt and pt mother in room to introduce self, explain role, and discuss discharge process. Pt is not a Cytogeneticist. No HCPOA. No DME.    A  06/23/2022, 2:15 PM

## 2022-06-23 NOTE — Progress Notes (Signed)
TF stopped. Flushed w/ 30 cc water.

## 2022-06-23 NOTE — Progress Notes (Signed)
Speech Language Pathology Daily Session Note  Patient Details  Name: Kristin Simmons MRN: [id number] Date of Birth: Oct 15, 1985  Today's Date: 06/23/2022 SLP Individual Time: 1191-4782 SLP Individual Time Calculation (min): 42 min  Short Term Goals: Week 1: SLP Short Term Goal 1 (Week 1): Patient will consume current diet with minimal overt s/s of aspiration with supervision supervision level verbal cues for use of swallowing compensatory strategies. SLP Short Term Goal 2 (Week 1): Patient will demonstrate sustained attention to functional tasks for 10 minutes with Mod verbal cues for redirection. SLP Short Term Goal 3 (Week 1): Patient will identify 2 physical and 2 cognitive deficits with Max verbal cues. SLP Short Term Goal 4 (Week 1): Patient will attend to left visual field during functional tasks with Mod A multimodal cues. SLP Short Term Goal 5 (Week 1): Patient will demonstrate functional problem solving for basic and familiar tasks with Mod verbal cues.  Skilled Therapeutic Interventions: Skilled treatment session focused on cognitive goals. Upon arrival, patient was awake but appeared lethargic while upright in the TIS wheelchair. [name] family present and patient required encouragement to participate in treatment session due to wanting to continue visit. Family provided encouragement and patient eventually agreeable. SLP administered the [facility] Mental Status Examination (SLUMS). Patient scored  15/30 points with a score of 27 or above considered normal with deficits in attention, problem solving, recall and visual perception. Function impacted fatigue and decreased attention requiring Mod-Max verbal cues for redirection. Patient transferred back to bed at end of session with +2 assist via stedy for safety. Patient left upright in bed with alarm on and family present. Continue with current plan of care.       Pain Pain Assessment Pain Score: 3  Patient repositioned  and premedicated   Therapy/Group: Individual Therapy  ,  06/23/2022, 1:20 PM

## 2022-06-23 NOTE — Progress Notes (Signed)
Physical Therapy Session Note  Patient Details  Name: Kristin Simmons MRN: [id number] Date of Birth: 04/26/1985  Today's Date: 06/23/2022 PT Individual Time: 1st Treatment Session: 0830-0900; 2nd Treatment Session: 1345-1500 PT Individual Time Calculation (min): 30 min; 75 min  Short Term Goals: Week 1:  PT Short Term Goal 1 (Week 1): Patient will perform bed mobility with MinA PT Short Term Goal 2 (Week 1): Patient will perform bed/chair transfer with ModA x1 PT Short Term Goal 3 (Week 1): Patient will gait train x50' with LRAD and MaxA x1 PT Short Term Goal 4 (Week 1): Patient will be able to tolerate sitting upright in TIS wheelchair between treatment sessions  Skilled Therapeutic Interventions/Progress Updates:  1st Treatment Session- Patient greeted supine in bed and agreeable to PT treatment session. Patient transitioned from supine to sitting EOB with Mod/MaxA for righting trunk and placing both LE off the bed. While sitting EOB, patient continues to present with L lateral and posterior lean with downward gaze and forward flexed head. Multimodal cues provided for improved sitting balance and posturing with rehab tech present providing Min/ModA for sitting balance. While sitting EOB, therapist provided Mod/MaxA for donning shirt and threaded pants for time management. Patient stood from EOB with MaxA while rehab tech pulled pants over hips- Patient then performed a stand pivot transfer to TIS wheelchair with MaxA and therapist blocking L knee throughout transfer, as well as assisting with placement. Patient reported need to use the restroom- Patient performed stand pivot transfer to/from TIS wheelchair and toilet with MaxA (same as described above). While sitting on the toilet, patient required Min/ModA for sitting balance secondary to L lateral lean with poor righting reactions. Patient with continent void while sitting on the toilet and NT notified and agreeable to documenting. Patient stood  with MaxA while rehab tech performed pericare and managed pants/brief. Patient left reclined in TIS wheelchair with seat belt on, posey belt on, call bell within reach and all needs met.    2nd Treatment Session- Patient greeted supine in bed with RN present administering medications- Patient agreeable to PT treatment session. Patient transitioned from supine to sitting EOB with MaxA for righting trunk and managing B LE. While sitting EOB, patient continues to require Min/ModA secondary to L lateral lean with poor righting reactions. Patient performed sit to stand without the use of an AD and MaxA- Patient then performed stand pivot transfer to TIS wheelchair with MaxA. Patient requesting to eat her lunch while sitting up- Therapist provided full supervision for her meal secondary to impulsive behaviors throughout eating. Patient attempted to eat at a quick pace and take too large of bites requiring assistance from therapist for taking breaks in between bites in order to chew the entire bite and for smaller bites. Patient requesting to use the restroom prior to leaving her room- Patient transferred onto/off of the toilet via stedy for improved safety with transfers and toileting. Patient requires stedy +2 secondary to L lateral lean. Patient with continent void and was able to perform pericare with CGA for balance. Patient wheeled outside of main gym for time management and energy conservation. Patient gait trained x35' with R HR in the hallway and ModA with rehab tech providing a wheelchair follow for safety. Therapist sitting on a stool with L LE ace wrapped into dorsiflexion and eversion in order to improve foot clearance. Patient was able to slightly advance L LE, however required total assistance for blocking knee and preventing hyperextension throughout stance phase of gait.  VC for improved R step length, postural extension, forward gaze and improved lateral weight shifting. Mirror placed in front of  patient for external visual cues regarding posturing- Post gait trial BP was 153/102 with RN notified and aware, however no s/s from patient. Patient returned to her room and requesting to stay reclined in TIS wheelchair- Patient left with posey belt on, seatbelt on, call bell within reach, L UE propped on a pillow, towel rolled behind her head for improved neck support and all needs met.    Therapy Documentation Precautions:  Precautions Precautions: Fall Precaution Comments: R craniectomy, no bone flap, bone flap placed in abdomen, R hand mitt, coretrak, flexiseal Restrictions Weight Bearing Restrictions: No  Pain: Patient continues to report neck pain secondary to poor posturing/positioning- RN applied lidocaine patches with reports of improvements. Therapist also assisted with positioning in the TIS wheelchair in order to decrease overall pain.   Therapy/Group: Individual Therapy     06/23/2022, 8:27 AM

## 2022-06-23 NOTE — Progress Notes (Signed)
Patient ID: Kristin Simmons, female   DOB: [birth year], 37 y.o.   MRN: [id number]  SW met with pt and pt mother in room to provide statement of service, and discuss discharge plan. Pt mother reports she will provide assistance during the day as she works 3rd shift, and her children, aunt, and cousin will help at night. SW discussed disability. SW shared will submit referral to [facility]. SW will return to room for signature.   *When SW returned, pt mother already gone. SW spoke with pt mother informing form for [facility] referral will be left in an envelope in the room.   SW left message for [employer]. Cap/DA program to submit referral and waiting on follow-up.  Cecile Sheerer, MSW, LCSWA Office: [phone] Cell: [phone] Fax: [phone]

## 2022-06-23 NOTE — Progress Notes (Signed)
PROGRESS NOTE   Subjective/Complaints:  No events obvernight.   No BM recorded since rectal tube removal. Only continent urination overnight, no retention on PVRs.   Labs this AM WNL.  Orthostatic vitals appropriate with BP; mild tachycardia into 120s. Diastolic BP much [id number] with reduced florinef.   ROS:  + R shoulder/neck pain - ongoing  Denies fevers, chills, N/V, abdominal pain, constipation, diarrhea, SOB, cough, chest pain, new weakness or paraesthesias.    Objective:   No results found. Recent Labs    06/22/22 0640  WBC 7.6  HGB 10.7*  HCT 31.9*  PLT 589*    Recent Labs    06/22/22 1138 06/23/22 0701  NA 136 137  K 3.8 4.1  CL 96* 101  CO2 30 30  GLUCOSE 103* 106*  BUN 11 13  CREATININE 0.52 0.62  CALCIUM 9.8 9.5     Intake/Output Summary (Last 24 hours) at 06/23/2022 0918 Last data filed at 06/23/2022 0724 Gross per 24 hour  Intake 480 ml  Output 1500 ml  Net -1020 ml         Physical Exam: Vital Signs Blood pressure (!) 139/107, pulse 97, temperature 98.2 F (36.8 C), temperature source Oral, resp. rate 18, height 5\' 4"  (1.626 m), weight 53.2 kg, SpO2 100 %. Constitutional: No apparent distress. Appropriate appearance for age.  Seen sitting in wheelchair, in room with many family members around. HENT: No JVD. Neck Supple. Trachea midline. + R craniotomy site- swollen, mildly increased from last exam, fluctuant and nontender Eyes: PERRLA. EOMI. Visual fields grossly intact.  Cardiovascular: RRR, no murmurs/rub/gallops. No Edema. Peripheral pulses 2+  Respiratory: CTAB. No rales, rhonchi, or wheezing. On RA.  Abdomen: + bowel sounds, mildly hyper active. No distention or tenderness.  GU: Not examined.   Skin: R craniectomy and R abdominal surgical sites -status post staple removal, well-approximated, CDI MSK:      No apparent deformity.      Strength:                RUE: 5/5 SA, 5/5  EF, 5/5 EE, 5/5 WE, 5/5 FF, 5/5 FA                 LUE:  0/5                RLE: 5/5 HF, 5/5 KE, 5/5 DF, 5/5 EHL, 5/5 PF                 LLE:  1/5 HF, otherwise 0/5  Neurologic exam:  Cognition: AAO to person, place; not time, with options.  Positive perseveration Language: Fluent, No substitutions or neoglisms. No dysarthria.  Insight: Poor insight into current condition.  Mood: Pleasant affect, appropriate mood.  Sensation: Hypersensitive in left upper extremity and left lower extremity to touch; endorses altered from right side Reflexes: 2+ in R UE and LE; LUE and LLE hyporeflexic. Negative Hoffman's and babinski signs bilaterally.  CN: + L tongue deviation Coordination: No apparent tremors.  Spasticity: MAS 1 left finger flexors, left hip adductors   Assessment/Plan: 1. Functional deficits which require 3+ hours per day of interdisciplinary therapy in a comprehensive inpatient rehab setting.  Physiatrist is providing close team supervision and 24 hour management of active medical problems listed below. Physiatrist and rehab team continue to assess barriers to discharge/monitor patient progress toward functional and medical goals  Care Tool:  Bathing    Body parts bathed by patient: Left arm, Chest, Abdomen, Right upper leg   Body parts bathed by helper: Right arm, Front perineal area, Buttocks, Left upper leg, Right lower leg, Left lower leg, Face     Bathing assist Assist Level: 2 Helpers     Upper Body Dressing/Undressing Upper body dressing   What is the patient wearing?: Pull over shirt    Upper body assist Assist Level: Total Assistance - Patient < 25%    Lower Body Dressing/Undressing Lower body dressing      What is the patient wearing?: Pants     Lower body assist Assist for lower body dressing: Total Assistance - Patient < 25%     Toileting Toileting    Toileting assist Assist for toileting: Total Assistance - Patient < 25%     Transfers Chair/bed  transfer  Transfers assist     Chair/bed transfer assist level: Maximal Assistance - Patient 25 - 49%     Locomotion Ambulation   Ambulation assist      Assist level: 2 helpers Assistive device: Other (comment) (Hemi-rail) Max distance: 30'   Walk 10 feet activity   Assist     Assist level: 2 helpers     Walk 50 feet activity   Assist Walk 50 feet with 2 turns activity did not occur: Safety/medical concerns (Patient unable to ambulate >30' at this time secondary to fatigue, poor endurance/activity tolerance, impaired attention, increased pain, etc.)         Walk 150 feet activity   Assist Walk 150 feet activity did not occur: Safety/medical concerns         Walk 10 feet on uneven surface  activity   Assist Walk 10 feet on uneven surfaces activity did not occur: Safety/medical concerns         Wheelchair     Assist Is the patient using a wheelchair?: Yes Type of Wheelchair:  (TIS/Recline back wheelchair)    Wheelchair assist level: Dependent - Patient 0% Max wheelchair distance: 150'    Wheelchair 50 feet with 2 turns activity    Assist        Assist Level: Dependent - Patient 0%   Wheelchair 150 feet activity     Assist      Assist Level: Dependent - Patient 0%   Blood pressure (!) 139/107, pulse 97, temperature 98.2 F (36.8 C), temperature source Oral, resp. rate 18, height 5\' 4"  (1.626 m), weight 53.2 kg, SpO2 100 %.  1. 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 Conchita Paris. Bone flap in abdominal pocket, plan for subacute flap replacement after IPR             -patient may  shower if cover incisions             -ELOS/Goals: 18-20 days- supervision to min A   - Safety precautions due to poor impulse control - Helmet ordered for OOB 5/8; WHO and [facility] 5/9  2.   Antithrombotics: -DVT/anticoagulation:  Pharmaceutical: Lovenox initiated 07/04/2022             -antiplatelet therapy: N/A  3. Pain Management: Oxycodone as needed  5/8:  added lidocaine patch x2 to L neck, shoulder  5/9: Add gabapentin 300 mg TID for LUE and LLE pain, likely related to spasticity and neuropathic.   4. Mood/Behavior/Sleep: PRN twice daily as needed provide emotional support             -antipsychotic agents: N/A  - Sleep log added - poor sleep 3 hours last night. Add melatonin 5 mg QHS + PRN trazodone 50-75 mg.   5. Neuropsych/cognition: This patient is not capable of making decisions on her own behalf -not consistently oriented, impulsive with poor awareness..   - Obs for safety  6. Skin/Wound Care: Routine skin checks  - 5/8: 3 weeks post-R craniectomy; ordered staple removal from scalp and abdomen. no apparent wounds will DC rectal tube  7. Fluids/Electrolytes/Nutrition/Dysphagia: Routine in and outs with follow-up chemistries Diet Orders (From admission, onward)     Start     Ordered   06/22/22 0845  DIET DYS 3 Room service appropriate? Yes with Assist; Fluid consistency: Thin; Fluid restriction: 1500 mL Fluid  Diet effective now       Comments: Meds whole with liquid; full supervision with meals  Question Answer Comment  Room service appropriate? Yes with Assist   Fluid consistency: Thin   Fluid restriction: 1500 mL Fluid      06/22/22 0844            8.  Hyponatremia.  Continue sodium chloride tablets.  Florinef 0.3 mg 3 times daily  - 5/8: Na 134 this AM. Downtrending over last 2 days. No obvious medication contributors; Already on salt tabs 2g TID. Placed 1500 ml fluid restriction, repeat today 138. Ordered daily BMP x3 days for monitoring. Reduce to Florinef 0.1 mg TID and monitor.   5/9: Na stable 13. Diastolic BP improved, remains high systolic. Orthostatic vitals negative with BP. Given florinef indication is hyponatremia not orthostasis will continue  0.1 mg TID through Friday then DC if no significant drop in Na.     10.  Decreased nutritional storage.  Currently on a dysphagia #3 diet.  Nasogastric tube for nutritional support.  Dietary follow-up  11.  Urinary retention.  Urecholine 5 mg 3 times daily.  Check PVR  5/8: No PVRs overnight; ordered. Mixed continence overnight.   5/9: Continue UO and low PVRs. DC urecholine.   12.  History of tobacco/alcohol use.  Counseling  13. Diarrhea. Remove rectal tube 5/8. Increase banatrol to TID.     - 5/9: No recorded Bms since rectal tube removal; reduce banatrol back to BID, obtain KUB  LOS: 2 days A FACE TO FACE EVALUATION WAS PERFORMED  Angelina Sheriff 06/23/2022, 9:18 AM

## 2022-06-23 NOTE — Care Management (Signed)
[facility] Individual Statement of Services  Patient Name:  Kristin Simmons  Date:  06/23/2022  Welcome to the [facility].  Our goal is to provide you with an individualized program based on your diagnosis and situation, designed to meet your specific needs.  With this comprehensive rehabilitation program, you will be expected to participate in at least 3 hours of rehabilitation therapies Monday-Friday, with modified therapy programming on the weekends.  Your rehabilitation program will include the following services:  Physical Therapy (PT), Occupational Therapy (OT), Speech Therapy (ST), 24 hour per day rehabilitation nursing, Therapeutic Recreaction (TR), Psychology, Neuropsychology, Care Coordinator, Rehabilitation Medicine, Nutrition Services, Pharmacy Services, and Other  Weekly team conferences will be held on Tuesdays to discuss your progress.  Your Inpatient Rehabilitation Care Coordinator will talk with you frequently to get your input and to update you on team discussions.  Team conferences with you and your family in attendance may also be held.  Expected length of stay: 3.5-4 weeks    Overall anticipated outcome: Minimal Assistance  Depending on your progress and recovery, your program may change. Your Inpatient Rehabilitation Care Coordinator will coordinate services and will keep you informed of any changes. Your Inpatient Rehabilitation Care Coordinator's name and contact numbers are listed  below.  The following services may also be recommended but are not provided by the Inpatient [facility]:  Driving Evaluations Home Health Rehabiltiation Services Outpatient Rehabilitation Services Vocational Rehabilitation   Arrangements will be made to provide these services after discharge if needed.  Arrangements include referral to agencies that provide these services.  Your insurance has been verified to be:  Medicaid [place] primary doctor is:  International aid/development worker  Pertinent information will be shared with your doctor and your insurance company.  Inpatient Rehabilitation Care Coordinator:  Susie Cassette [phone] or [phone]  Information discussed with and copy given to patient by: Gretchen Short, 06/23/2022, 10:15 AM

## 2022-06-23 NOTE — Progress Notes (Signed)
Occupational Therapy Session Note  Patient Details  Name: Kristin Simmons MRN: [id number] Date of Birth: 07-11-1985  Today's Date: 06/23/2022 OT Individual Time: 0920-1015 OT Individual Time Calculation (min): 55 min    Short Term Goals: Week 1:  OT Short Term Goal 1 (Week 1): Pt will sit EOM with MIN-MOD A for 5 min during funcitoanl task in prep for BADL OT Short Term Goal 2 (Week 1): Pt will locate items on L of sink with mod cuing OT Short Term Goal 3 (Week 1): Pt will verbalize 1 step of hemi dressing technique OT Short Term Goal 4 (Week 1): pt will demo improved awareness/decreased impulsivity by waiting for OT to transfer wiht no more than min cuing throughout session  Skilled Therapeutic Interventions/Progress Updates:    Pt greeted seated in TIS wc and agreeable to OT treatment session. Pt lethargic but agreeable with encouragement. Pt stated her neck and her legs hurt. Rest and repositioned for comfort. Stand-pivot to therapy mat with max A. Pt with poor sitting balance requiring mod to max A with any dynaic sitting activity. Worked on L visual scanning to look L and locate specific colored bean bags, then toss into bucket. OT moved bean bags to the R side to work on leaning to the R. Sit<>stands at mat with mod A and facilitation for full upright head and neck. L UE NMR with joint input through wrist and hand to bring pt through full ROM. Max A stand-pivot back to TIS wc. Pt returned to room where mother was present. OT placed SAEBO e-stim on wrist extensors.Marland Kitchen SAEBO left on for 60 minutes. OT returned to remove SAEBO with skin intact and no adverse reactions.  Sa[place]o Stim One 330 pulse width 35 Hz pulse rate On 8 sec/ off 8 sec Ramp up/ down 2 sec Symmetrical Biphasic wave form  Max intensity at 500 Ohm load  Pt left tilted in TIS wc with alarm belt on, mother present, and needs met.    Therapy Documentation Precautions:  Precautions Precautions: Fall Precaution  Comments: R craniectomy, no bone flap, bone flap placed in abdomen, R hand mitt, coretrak, flexiseal Restrictions Weight Bearing Restrictions: No Pain: Pain Assessment Pain Score: 3  Therapy/Group: Individual Therapy  Mal Amabile 06/23/2022, 10:17 AM

## 2022-06-23 NOTE — Progress Notes (Signed)
Orthopedic Tech Progress Note Patient Details:  KERENA PIZZOLA 06-Nov-1985 [id number]  Order for L rehab combo called into [facility].  Patient ID: RASHEDA TORONTO, female   DOB: 01-15-1986, 36 y.o.   MRN: [id number]  Docia Furl 06/23/2022, 2:04 PM

## 2022-06-23 NOTE — Progress Notes (Signed)
Ok to increase Lovenox to 40mg  SQ qday per Deatra Ina.  Ulyses Southward, PharmD, BCIDP, AAHIVP, CPP Infectious Disease Pharmacist 06/23/2022 11:44 AM

## 2022-06-24 LAB — GLUCOSE, CAPILLARY
Glucose-Capillary: 110 mg/dL — ABNORMAL HIGH (ref 70–99)
Glucose-Capillary: 86 mg/dL (ref 70–99)
Glucose-Capillary: 78 mg/dL (ref 70–99)
Glucose-Capillary: 105 mg/dL — ABNORMAL HIGH (ref 70–99)

## 2022-06-24 LAB — BASIC METABOLIC PANEL
Anion gap: 9 (ref 5–15)
BUN: 17 mg/dL (ref 6–20)
CO2: 27 mmol/L (ref 22–32)
Calcium: 9.4 mg/dL (ref 8.9–10.3)
Chloride: 99 mmol/L (ref 98–111)
Creatinine, Ser: 0.61 mg/dL (ref 0.44–1.00)
GFR, Estimated: >60 mL/min (ref >60)
Glucose, Bld: 103 mg/dL — ABNORMAL HIGH (ref 70–99)
Potassium: 3.7 mmol/L (ref 3.5–5.1)
Sodium: 135 mmol/L (ref 135–145)

## 2022-06-24 MED ORDER — GABAPENTIN 100 MG PO CAPS
100.0000 mg | ORAL_CAPSULE | Freq: Three times a day (TID) | ORAL | Status: DC
  Administered 2022-06-24 – 2022-06-28 (×12): 100 mg via ORAL
  Filled 2022-06-24 (×12): qty 1

## 2022-06-24 MED ORDER — OXYCODONE HCL 5 MG PO TABS
5.0000 mg | ORAL_TABLET | ORAL | Status: DC | PRN
  Administered 2022-06-24 – 2022-06-27 (×12): 5 mg via ORAL
  Filled 2022-06-24 (×13): qty 1

## 2022-06-24 MED ORDER — SODIUM CHLORIDE 1 G PO TABS
2.0000 g | ORAL_TABLET | Freq: Three times a day (TID) | ORAL | Status: DC
  Administered 2022-06-24 – 2022-07-02 (×24): 2 g via ORAL
  Filled 2022-06-24 (×24): qty 2

## 2022-06-24 MED ORDER — FLUDROCORTISONE ACETATE 0.1 MG PO TABS
0.2000 mg | ORAL_TABLET | Freq: Three times a day (TID) | ORAL | Status: DC
  Administered 2022-06-24 – 2022-06-26 (×6): 0.2 mg via ORAL
  Filled 2022-06-24 (×7): qty 2

## 2022-06-24 MED ORDER — TRAZODONE HCL 50 MG PO TABS
50.0000 mg | ORAL_TABLET | Freq: Every day | ORAL | Status: DC
  Administered 2022-06-24 – 2022-06-29 (×6): 50 mg via ORAL
  Filled 2022-06-24 (×6): qty 1

## 2022-06-24 MED ORDER — THIAMINE MONONITRATE 100 MG PO TABS
100.0000 mg | ORAL_TABLET | Freq: Every day | ORAL | Status: DC
  Administered 2022-06-25 – 2022-07-20 (×26): 100 mg via ORAL
  Filled 2022-06-24 (×26): qty 1

## 2022-06-24 MED ORDER — FLORANEX PO PACK
1.0000 g | PACK | Freq: Three times a day (TID) | ORAL | Status: DC
  Filled 2022-06-24: qty 1

## 2022-06-24 MED ORDER — LOPERAMIDE HCL 2 MG PO CAPS: 2.0000 mg | ORAL_CAPSULE | ORAL | Status: DC | PRN

## 2022-06-24 MED ORDER — DIPHENHYDRAMINE-ZINC ACETATE 2-0.1 % EX CREA
TOPICAL_CREAM | Freq: Two times a day (BID) | CUTANEOUS | Status: DC | PRN
  Administered 2022-06-30: 1 via TOPICAL
  Filled 2022-06-24: qty 28

## 2022-06-24 MED ORDER — FOLIC ACID 1 MG PO TABS
1.0000 mg | ORAL_TABLET | Freq: Every day | ORAL | Status: DC
  Administered 2022-06-25 – 2022-07-20 (×26): 1 mg via ORAL
  Filled 2022-06-24 (×26): qty 1

## 2022-06-24 MED ORDER — RISAQUAD PO CAPS
1.0000 | ORAL_CAPSULE | Freq: Three times a day (TID) | ORAL | Status: DC
  Administered 2022-06-24 – 2022-07-20 (×78): 1 via ORAL
  Filled 2022-06-24 (×78): qty 1

## 2022-06-24 NOTE — Progress Notes (Addendum)
PROGRESS NOTE   Subjective/Complaints:  No events overnight. Consuming 50% meals per chart. No BM since rectal tube removal; KUB with mild stool.   Using atarax TID; uncertain why.  Patient endorsing poor sleep overnight.  Per sleep log, slept approximately 7 hours.  Seen in therapy gym with OT, noted to be intermittently closing her eyes, lethargic.  Denied any current complaints.    ROS:  + R shoulder/neck pain - ongoing + Lethargy/insomnia - ongoing + Abdominal itching - ongoiong Denies fevers, chills, N/V, abdominal pain, diarrhea, SOB, cough, chest pain, new weakness or paraesthesias.    Objective:   DG Abd 1 View  Result Date: 06/23/2022 CLINICAL DATA:  Constipation. EXAM: ABDOMEN - 1 VIEW COMPARISON:  Limited x-ray 06/08/2022 and CT 06/10/2022 FINDINGS: Feeding tube with tip overlying the distal stomach. There is gas seen in nondilated loops of large bowel with scattered stool. High density area in the right hemiabdomen consistent with the history of craniectomy bone flap. Minimal small bowel gas. Air and stool in the rectum. Overall mild stool. Tubal ligation clips along the pelvis. Presumed vascular calcifications in the pelvis as well. IMPRESSION: Nonspecific bowel gas pattern with mild stool. Enteric tube overlying the distal stomach Electronically Signed   By: Karen Kays M.D.   On: 06/23/2022 16:04   Recent Labs    06/22/22 0640  WBC 7.6  HGB 10.7*  HCT 31.9*  PLT 589*    Recent Labs    06/23/22 0701 06/24/22 0600  NA 137 135  K 4.1 3.7  CL 101 99  CO2 30 27  GLUCOSE 106* 103*  BUN 13 17  CREATININE 0.62 0.61  CALCIUM 9.5 9.4     Intake/Output Summary (Last 24 hours) at 06/24/2022 0852 Last data filed at 06/24/2022 0814 Gross per 24 hour  Intake 800 ml  Output 1300 ml  Net -500 ml         Physical Exam: Vital Signs Blood pressure (!) 138/104, pulse 90, temperature 98.8 F (37.1 C),  temperature source Oral, resp. rate 16, height 5\' 4"  (1.626 m), weight 53.2 kg, SpO2 100 %. Constitutional: No apparent distress. Appropriate appearance for age.  Seen sitting in wheelchair, in room with many family members around. HENT: No JVD. Neck Supple. Trachea midline. + R craniotomy site- swollen, decresaed from last exam, fluctuant and nontender Eyes: PERRLA. EOMI. Visual fields grossly intact.  Cardiovascular: RRR, no murmurs/rub/gallops. No Edema. Peripheral pulses 2+  Respiratory: CTAB. No rales, rhonchi, or wheezing. On RA.  Abdomen: + bowel sounds, normoactive. No distention or tenderness.  GU: Not examined.   Skin: R craniectomy and R abdominal surgical sites -status post staple removal, well-approximated, CDI MSK:      No apparent deformity.      Strength:                RUE: 5/5 SA, 5/5 EF, 5/5 EE, 5/5 WE, 5/5 FF, 5/5 FA                 LUE:  0/5                RLE: 5/5 HF, 5/5 KE, 5/5 DF, 5/5 EHL,  5/5 PF                 LLE:  1/5 HF, otherwise 0/5  Neurologic exam:  Cognition: AAO to person, place; not time, with options.  +lethargy but responsive to verbal stimuli Language: Fluent, No substitutions or neoglisms. No dysarthria.  Insight: Poor insight into current condition.  Mood: Pleasant affect, appropriate mood.  Sensation: Hypersensitive in left upper extremity and left lower extremity to touch; endorses altered from right side - improved tolerance to touch Reflexes: 2+ in R UE and LE; LUE and LLE hyporeflexic. Negative Hoffman's and babinski signs bilaterally.  CN: + L tongue deviation Coordination: No apparent tremors.  Spasticity: MAS 1 left finger flexors, left hip adductors   Assessment/Plan: 1. Functional deficits which require 3+ hours per day of interdisciplinary therapy in a comprehensive inpatient rehab setting. Physiatrist is providing close team supervision and 24 hour management of active medical problems listed below. Physiatrist and rehab team  continue to assess barriers to discharge/monitor patient progress toward functional and medical goals  Care Tool:  Bathing    Body parts bathed by patient: Left arm, Chest, Abdomen, Right upper leg   Body parts bathed by helper: Right arm, Front perineal area, Buttocks, Left upper leg, Right lower leg, Left lower leg, Face     Bathing assist Assist Level: 2 Helpers     Upper Body Dressing/Undressing Upper body dressing   What is the patient wearing?: Pull over shirt    Upper body assist Assist Level: Total Assistance - Patient < 25%    Lower Body Dressing/Undressing Lower body dressing      What is the patient wearing?: Pants     Lower body assist Assist for lower body dressing: Total Assistance - Patient < 25%     Toileting Toileting    Toileting assist Assist for toileting: Total Assistance - Patient < 25%     Transfers Chair/bed transfer  Transfers assist  Chair/bed transfer activity did not occur: Safety/medical concerns  Chair/bed transfer assist level: Maximal Assistance - Patient 25 - 49%     Locomotion Ambulation   Ambulation assist      Assist level: 2 helpers Assistive device: Other (comment) (Hemi-rail) Max distance: 30'   Walk 10 feet activity   Assist     Assist level: 2 helpers     Walk 50 feet activity   Assist Walk 50 feet with 2 turns activity did not occur: Safety/medical concerns (Patient unable to ambulate >30' at this time secondary to fatigue, poor endurance/activity tolerance, impaired attention, increased pain, etc.)         Walk 150 feet activity   Assist Walk 150 feet activity did not occur: Safety/medical concerns         Walk 10 feet on uneven surface  activity   Assist Walk 10 feet on uneven surfaces activity did not occur: Safety/medical concerns         Wheelchair     Assist Is the patient using a wheelchair?: Yes Type of Wheelchair:  (TIS/Recline back wheelchair)    Wheelchair assist  level: Dependent - Patient 0% Max wheelchair distance: 150'    Wheelchair 50 feet with 2 turns activity    Assist        Assist Level: Dependent - Patient 0%   Wheelchair 150 feet activity     Assist      Assist Level: Dependent - Patient 0%   Blood pressure (!) 138/104, pulse 90, temperature 98.8 F (  37.1 C), temperature source Oral, resp. rate 16, height 5\' 4"  (1.626 m), weight 53.2 kg, SpO2 100 %.  1. 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 Conchita Paris. Bone flap in abdominal pocket, plan for subacute flap replacement after IPR             -patient may  shower if cover incisions             -ELOS/Goals: 18-20 days- supervision to min A   Safety precautions due to poor impulse control - Helmet ordered for OOB 5/8; WHO and [facility] 5/9  2.  Antithrombotics: -DVT/anticoagulation:  Pharmaceutical: Lovenox initiated 07/04/2022             -antiplatelet therapy: N/A  3. Pain Management: Oxycodone as needed  5/8: added lidocaine patch x2 to L neck, shoulder  5/9: Add gabapentin 300 mg TID for LUE and LLE pain, likely related to spasticity and neuropathic.   5/10: Mild lethargy today but no use of PRN pain medications; decrease gabapentin to 100 mg TID  4. Mood/Behavior/Sleep: PRN twice daily as needed provide emotional support             -antipsychotic agents: N/A  - Sleep log added - poor sleep 3 hours last night. Add melatonin 5 mg QHS + PRN trazodone 50-75 mg.   5/10: Slept 7 hours overnight, still endorsing poor sleep.  Did not use as needed trazodone, will add 50 mg standing nightly.  5. Neuropsych/cognition: This patient is not capable of making decisions on her own behalf -not consistently oriented, impulsive with poor awareness..   - Obs for safety  6. Skin/Wound Care: Routine skin checks  - 5/8: 3 weeks post-R craniectomy; ordered  staple removal from scalp and abdomen. no apparent wounds will DC rectal tube    7. Fluids/Electrolytes/Nutrition/Dysphagia: Routine in and outs with follow-up chemistries Diet Orders (From admission, onward)     Start     Ordered   06/22/22 0845  DIET DYS 3 Room service appropriate? Yes with Assist; Fluid consistency: Thin; Fluid restriction: 1500 mL Fluid  Diet effective now       Comments: Meds whole with liquid; full supervision with meals  Question Answer Comment  Room service appropriate? Yes with Assist   Fluid consistency: Thin   Fluid restriction: 1500 mL Fluid      06/22/22 0844          -5-10: If continues with p.o.'s greater than 50% of meals over the weekend, can likely DC core track.  8.  Hyponatremia.  Continue sodium chloride tablets.  Florinef 0.3 mg 3 times daily  - 5/8: Na 134 this AM. Downtrending over last 2 days. No obvious medication contributors; Already on salt tabs 2g TID. Placed 1500 ml fluid restriction, repeat today 138. Ordered daily BMP x3 days for monitoring. Reduce to Florinef 0.1 mg TID and monitor.   5/9: Na stable 13. Diastolic BP improved, remains high systolic. Orthostatic vitals negative with BP. Given florinef indication is hyponatremia not orthostasis will continue 0.1 mg TID through Friday then DC if no significant drop in Na.   5-10: Na down to 135 with mildly increased lethargy.  Increase Florinef to 0.2 mg 3 times daily, continue daily BMP for 3 days.    10.  Decreased nutritional storage.  Currently on a dysphagia #3 diet.  Nasogastric tube for nutritional support.  Dietary follow-up  11.  Urinary retention.  Urecholine 5 mg 3 times daily.  Check PVR  5/8: No PVRs overnight; ordered. Mixed continence overnight.   5/9: Continue UO and low PVRs. DC urecholine.   5/10: PVRs remain low, continent.  Continue for 24 hours, then DC PVRs.  12.  History of tobacco/alcohol use.  Counseling  13. Diarrhea. Remove rectal tube 5/8. Increase  banatrol to TID.     - 5/9: No recorded Bms since rectal tube removal; reduce banatrol back to BID, obtain KUB  - 5/10: DC banatrol. KUB with mild stool burden.  If no BM today, will need to start laxatives.  LOS: 3 days A FACE TO FACE EVALUATION WAS PERFORMED  Angelina Sheriff 06/24/2022, 8:52 AM

## 2022-06-24 NOTE — Progress Notes (Signed)
Patient ID: Kristin Simmons, female   DOB: 03-03-1985, 37 y.o.   MRN: [id number]  SW submitted disability referral to [facility] and waiting on follow-up.   Cecile Sheerer, MSW, LCSWA Office: [phone] Cell: [phone] Fax: [phone]

## 2022-06-24 NOTE — Progress Notes (Signed)
Speech Language Pathology Daily Session Note  Patient Details  Name: Kristin Simmons MRN: [id number] Date of Birth: 1985-08-08  Today's Date: 06/24/2022 SLP Individual Time: 4098-1191 SLP Individual Time Calculation (min): 40 min  Short Term Goals: Week 1: SLP Short Term Goal 1 (Week 1): Patient will consume current diet with minimal overt s/s of aspiration with supervision supervision level verbal cues for use of swallowing compensatory strategies. SLP Short Term Goal 2 (Week 1): Patient will demonstrate sustained attention to functional tasks for 10 minutes with Mod verbal cues for redirection. SLP Short Term Goal 3 (Week 1): Patient will identify 2 physical and 2 cognitive deficits with Max verbal cues. SLP Short Term Goal 4 (Week 1): Patient will attend to left visual field during functional tasks with Mod A multimodal cues. SLP Short Term Goal 5 (Week 1): Patient will demonstrate functional problem solving for basic and familiar tasks with Mod verbal cues.  Skilled Therapeutic Interventions: Skilled treatment session focused on dysphagia and cognitive goals. Upon arrival, patient was consuming her breakfast meal of Dys. 3 textures with thin liquids. Patient without overt s/s of aspiration but required Min verbal cues for use of swallowing compensatory strategies. Mod verbal cues were needed for sustained attention to task as patient perseverative on pain. Nursing aware and administered medications. Recommend patient continue current diet. SLP also facilitated session by providing a basic money management task.  Patient required Max verbal cues for sustained attention and Mod verbal and visual cues for functional problem solving throughout task. [name] parents arrived during session and gave patient her phone despite being in a treatment session. Patient extremely distracted and watching videos on Facebook despite Max A multimodal by SLP. Patient eventually becoming mildly verbally agitated  at attempts by SLP at redirection. Requested that patient's mother take the patient's cell phone home with her as it is a significant distraction at this time. She verbalized understanding. Patient left upright in bed with alarm on and all needs within reach. Continue with current plan of care.      Pain 9/10 pain in face and headache Nursing aware and administered medications   Therapy/Group: Individual Therapy  ,  06/24/2022, 1:30 PM

## 2022-06-24 NOTE — Progress Notes (Signed)
Occupational Therapy Session Note  Patient Details  Name: Kristin Simmons MRN: [id number] Date of Birth: 12-May-1985  Today's Date: 06/24/2022 OT Individual Time: 1345-1430 OT Individual Time Calculation (min): 45 min    Short Term Goals: Week 1:  OT Short Term Goal 1 (Week 1): Pt will sit EOM with MIN-MOD A for 5 min during funcitoanl task in prep for BADL OT Short Term Goal 2 (Week 1): Pt will locate items on L of sink with mod cuing OT Short Term Goal 3 (Week 1): Pt will verbalize 1 step of hemi dressing technique OT Short Term Goal 4 (Week 1): pt will demo improved awareness/decreased impulsivity by waiting for OT to transfer wiht no more than min cuing throughout session  Skilled Therapeutic Interventions/Progress Updates:    Pt received in bed with pain in neck. Rest and repositioning provided for pain relief  ADL: Pt completes toileting with MOD A stand pivot transfer while +2 advances pants past hips in standing. Pt voids bladder on toilet with small BM in toilet. Pt able to wipe front seated, but OT provides posterior hygiene.   Therapeutic activity Pt completes standing balance: functional reach to R for weight shift to decrease L lean for squigs and for bean bags during corn hole. Pt needs mirror for visual feedback. Pt reporting unable to feel anything on that side but was able ot activate hamstrings and quads in seated and stance with MAX multimodal cuing.  Pt left at end of session in bed with exit alarm on, call light in reach and all needs met   Therapy Documentation Precautions:  Precautions Precautions: Fall Precaution Comments: R craniectomy, no bone flap, bone flap placed in abdomen, R hand mitt, coretrak, flexiseal Restrictions Weight Bearing Restrictions: No  Therapy/Group: Individual Therapy  Shon Hale 06/24/2022, 6:56 AM

## 2022-06-24 NOTE — Progress Notes (Signed)
Occupational Therapy Session Note  Patient Details  Name: Kristin Simmons MRN: [id number] Date of Birth: 1985/05/16  Today's Date: 06/24/2022 OT Individual Time: 1103-1200 OT Individual Time Calculation (min): 57 min    Short Term Goals: Week 1:  OT Short Term Goal 1 (Week 1): Pt will sit EOM with MIN-MOD A for 5 min during funcitoanl task in prep for BADL OT Short Term Goal 2 (Week 1): Pt will locate items on L of sink with mod cuing OT Short Term Goal 3 (Week 1): Pt will verbalize 1 step of hemi dressing technique OT Short Term Goal 4 (Week 1): pt will demo improved awareness/decreased impulsivity by waiting for OT to transfer wiht no more than min cuing throughout session  Skilled Therapeutic Interventions/Progress Updates:    Pt greeted sitting in TIS wc and agreeable to OT treatment session.  Pt participated in dance group session with OT providing hand over hand A for B UE coordination and integration of L UE while dancing. Pt stood and danced with OT to the song "My Girl" with mod A and cues for R LE positioning. Incorporated PNF patters within dancing for neuro re-ed as well. OT placed kinesiotape on L shoulder for shoulder girdle support. Pt returned to room and pivoted back to bed with mod A. Pt left semi-reclined in bed with bed alarm on, call bell in reach, and needs met.   Therapy Documentation Precautions:  Precautions Precautions: Fall Precaution Comments: R craniectomy, no bone flap, bone flap placed in abdomen, R hand mitt, coretrak, flexiseal Restrictions Weight Bearing Restrictions: No Pain: Pain Assessment Pain Scale: 0-10 Pain Score: 6  Pain Type: Surgical pain Pain Location: Head Pain Orientation: Right Pain Descriptors / Indicators: Headache Pain Frequency: Intermittent Pain Onset: On-going Patients Stated Pain Goal: 0 Pain Intervention(s): Pain med given for lower pain score than stated, per patient request;Medication (See eMAR) Multiple Pain Sites:  No   Therapy/Group: Individual Therapy  Mal Amabile 06/24/2022, 12:11 PM

## 2022-06-24 NOTE — IPOC Note (Signed)
Overall Plan of Care [facility]) Patient Details Name: Kristin Simmons MRN: [id number] DOB: 1985/10/03  Admitting Diagnosis: SAH (subarachnoid hemorrhage) [facility])  Hospital Problems: Principal Problem:   SAH (subarachnoid hemorrhage) (HCC) Active Problems:   Right internal carotid artery aneurysm     Functional Problem List: Nursing Safety, Medication Management, Nutrition, Bowel, Bladder, Pain, Endurance, Skin Integrity  PT Balance, Perception, Behavior, Safety, Sensory, Endurance, Skin Integrity, Motor, Nutrition, Pain  OT Balance, Behavior, Cognition, Endurance, Motor, Pain, Perception, Sensory, Vision, Skin Integrity, Nutrition, Safety  SLP Cognition, Nutrition  TR         Basic ADL's: OT Grooming, Bathing, Toileting, Dressing     Advanced  ADL's: OT       Transfers: PT Bed Mobility, Bed to Chair, Customer service manager, Tub/Shower     Locomotion: PT Ambulation, Psychologist, prison and probation services, Stairs     Additional Impairments: OT Fuctional Use of Upper Extremity  SLP Swallowing, Social Cognition   Social Interaction, Attention, Awareness, Problem Solving, Memory  TR      Anticipated Outcomes Item Anticipated Outcome  Self Feeding S  Swallowing  Supervision   Basic self-care  MIN  Toileting  MIN   Bathroom Transfers MIN  Bowel/Bladder  manage bowel w mod I and bladder w toileting  Transfers  MinA with LRAD  Locomotion  MinA with LRAD for household distances  Communication     Cognition  Min A  Pain  < 4 with prns  Safety/Judgment  manage w cues   Therapy Plan: PT Intensity: Minimum of 1-2 x/day ,45 to 90 minutes PT Frequency: 5 out of 7 days PT Duration Estimated Length of Stay: 3-4 weeks OT Intensity: Minimum of 1-2 x/day, 45 to 90 minutes OT Frequency: 5 out of 7 days OT Duration/Estimated Length of Stay: 3.5-4 weeks SLP Intensity: Minumum of 1-2 x/day, 30 to 90 minutes SLP Frequency: 1 to 3 out of 7 days SLP Duration/Estimated Length of Stay: 3-4 weeks    Team Interventions: Nursing Interventions Bladder Management, Disease Management/Prevention, Medication Management, Discharge Planning, Pain Management, Skin Care/Wound Management, Bowel Management, Patient/Family Education  PT interventions Ambulation/gait training, Community reintegration, DME/adaptive equipment instruction, Neuromuscular re-education, Psychosocial support, Stair training, UE/LE Strength taining/ROM, Wheelchair propulsion/positioning, Warden/ranger, Discharge planning, Functional electrical stimulation, Pain management, Skin care/wound management, Therapeutic Activities, UE/LE Coordination activities, Cognitive remediation/compensation, Disease management/prevention, Functional mobility training, Patient/family education, Splinting/orthotics, Therapeutic Exercise, Visual/perceptual remediation/compensation  OT Interventions Balance/vestibular training, Discharge planning, Functional electrical stimulation, Pain management, Self Care/advanced ADL retraining, Therapeutic Activities, UE/LE Coordination activities, Visual/perceptual remediation/compensation, Therapeutic Exercise, Skin care/wound managment, Patient/family education, Functional mobility training, Disease mangement/prevention, Cognitive remediation/compensation, Firefighter, Fish farm manager, Neuromuscular re-education, Psychosocial support, Splinting/orthotics, UE/LE Strength taining/ROM, Wheelchair propulsion/positioning  SLP Interventions Cognitive remediation/compensation, Financial trader, Environmental controls, Functional tasks, Internal/external aids, Patient/family education, Therapeutic Activities  TR Interventions    SW/CM Interventions Discharge Planning, Psychosocial Support, Patient/Family Education   Barriers to Discharge MD  Medical stability, Home enviroment access/loayout, Incontinence, Neurogenic bowel and bladder, Wound care, Lack of/limited family support,  Insurance for SNF coverage, Medication compliance, Behavior, and Nutritional means  Nursing Decreased caregiver support, Home environment access/layout 2 level townhome; no bath on main with 5 children; mother to assist at discharge  PT Home environment Best boy, Insurance for SNF coverage, Wound Care, Inaccessible home environment, Behavior, Nutrition means    OT Decreased caregiver support, Home environment access/layout, Inaccessible home environment, Behavior    SLP Lack of/limited family support    SW Insurance for SNF coverage, Decreased caregiver  support, Lack of/limited family support     Team Discharge Planning: Destination: PT-Home ,OT- Home , SLP-Home Projected Follow-up: PT-Outpatient PT (TBD, pending progress), OT-  Outpatient OT, SLP-24 hour supervision/assistance Projected Equipment Needs: PT-To be determined, OT- Tub/shower bench, 3 in 1 bedside comode, To be determined, SLP-None recommended by SLP Equipment Details: PT-TBD, OT-  Patient/family involved in discharge planning: PT- Patient,  OT-Patient, SLP-Patient  MD ELOS: 16-20 days Medical Rehab Prognosis:  Good Assessment: The patient has been admitted for CIR therapies with the diagnosis of R MCA stroke. The team will be addressing functional mobility, strength, stamina, balance, safety, adaptive techniques and equipment, self-care, bowel and bladder mgt, patient and caregiver education. Goals have been set at Min A to supervision. Anticipated discharge destination is home.       See Team Conference Notes for weekly updates to the plan of care

## 2022-06-24 NOTE — Progress Notes (Signed)
Occupational Therapy Session Note  Patient Details  Name: Kristin Simmons MRN: [id number] Date of Birth: [birth year]  {CHL IP REHAB OT TIME CALCULATIONS:[id number]}   Short Term Goals: Week 1:  OT Short Term Goal 1 (Week 1): Pt will sit EOM with MIN-MOD A for 5 min during funcitoanl task in prep for BADL OT Short Term Goal 2 (Week 1): Pt will locate items on L of sink with mod cuing OT Short Term Goal 3 (Week 1): Pt will verbalize 1 step of hemi dressing technique OT Short Term Goal 4 (Week 1): pt will demo improved awareness/decreased impulsivity by waiting for OT to transfer wiht no more than min cuing throughout session  Skilled Therapeutic Interventions/Progress Updates:  Pt received *** for skilled OT session with focus on ***. Pt agreeable to interventions, demonstrating overall *** mood. Pt reported ***/10 pain, stating "***" in reference to ***. OT offering intermediate rest breaks and positioning suggestions throughout session to address pain/fatigue and maximize participation/safety in session.    Pt remained *** with all immediate needs met at end of session. Pt continues to be appropriate for skilled OT intervention to promote further functional independence.    Therapy Documentation Precautions:  Precautions Precautions: Fall Precaution Comments: R craniectomy, no bone flap, bone flap placed in abdomen, R hand mitt, coretrak, flexiseal Restrictions Weight Bearing Restrictions: No   Therapy/Group: Individual Therapy  Lou Cal, OTR/L, MSOT  06/24/2022, 8:24 PM

## 2022-06-24 NOTE — Progress Notes (Signed)
TF stopped per orders; flushed w/ water.

## 2022-06-24 NOTE — Progress Notes (Signed)
Physical Therapy Session Note  Patient Details  Name: Kristin Simmons MRN: [id number] Date of Birth: July 28, 1985  Today's Date: 06/24/2022 PT Individual Time: 0915-1015 PT Individual Time Calculation (min): 60 min   Short Term Goals: Week 1:  PT Short Term Goal 1 (Week 1): Patient will perform bed mobility with MinA PT Short Term Goal 2 (Week 1): Patient will perform bed/chair transfer with ModA x1 PT Short Term Goal 3 (Week 1): Patient will gait train x50' with LRAD and MaxA x1 PT Short Term Goal 4 (Week 1): Patient will be able to tolerate sitting upright in TIS wheelchair between treatment sessions  Skilled Therapeutic Interventions/Progress Updates:  Patient greeted supine in bed with mother and father present and agreeable to PT treatment session with encouragement. Patient transitioned from supine to sitting EOB with MaxA and VC for improved sequencing. Therapist provided total assist for scooting hips closer toward the EOB in order for B feet to touch the floor for improved sitting balance. Rehab tech present providing Min/ModA for sitting balance while therapist donned shirt, pants and socks/shoes with patient assisting as able however not responsive to education and utilizing a hemi-technique. Patient stood from EOB with MaxA and rehab tech pulled pants over hips while standing- Patient then performed stand pivot transfer to TIS wheelchair with therapist advancing and blocking L LE. Patient wheeled to rehab gym with helmet donned.   Patient performed stand pivot transfer to/from TIS wheelchair and mat table via stand pivot and MaxA- Therapist advancing and blocking L LE throughout transfer. Multimodal cues for improved postural extension and forward gaze throughout transfer.   Patient sat EOM with mirror in front for external visual cues regarding posturing and therapist posterior lateral of her in order to assist with approximating R hip toward the mat and elongating L lateral trunk.  Patient tolerated sitting ~10 minutes with improved stability noted and able to self correct at times, however unable to sustain as she fatigued. Patient required a supine rest break after activity secondary to reports of fatigue and increased neck pain.   While sitting EOM, patient tasked with reaching outside her BOS to her R with R UE in order to improve R lateral lean and righting reactions- Patient reach for x7 cones with rehab tech on her L side for safety. Patient tasked with locating midline after reaching for each cone with goo improvements noted. Patient with only one L lateral LOB, however was able to use her R hand on therapist for improved stability. Patient again required a supine rest break after this activity secondary to fatigue.   Patient gait trained x20' at R hemi-rail with L LE ace wrapped into dorsiflexion and eversion for improved foot clearance during swing phase of gait. [name] L UE around therapist with therapist advancing and blocking L LE for improved stability- Patient able to demonstrate trace activation during swing phase, however unable to activate in stance phase with significant buckling noted without therapist blocking.   Patient returned to her room and left reclined in TIS wheelchair with heating packs on the posterior aspect of her neck, towel rolled to support her neck, seatbelt and posey belt on, call bell within reach, warm blanket on and all needs met.    Therapy Documentation Precautions:  Precautions Precautions: Fall Precaution Comments: R craniectomy, no bone flap, bone flap placed in abdomen, R hand mitt, coretrak, flexiseal Restrictions Weight Bearing Restrictions: No  Pain: Patient reported neck pain throughout treatment session- Therapist applied heat packs to the back  of her neck at the end of session and repositioned her to comfort.    Therapy/Group: Individual Therapy     06/24/2022, 7:55 AM

## 2022-06-25 LAB — GLUCOSE, CAPILLARY
Glucose-Capillary: 117 mg/dL — ABNORMAL HIGH (ref 70–99)
Glucose-Capillary: 79 mg/dL (ref 70–99)
Glucose-Capillary: 83 mg/dL (ref 70–99)
Glucose-Capillary: 119 mg/dL — ABNORMAL HIGH (ref 70–99)

## 2022-06-25 LAB — BASIC METABOLIC PANEL
Anion gap: 12 (ref 5–15)
BUN: 14 mg/dL (ref 6–20)
CO2: 26 mmol/L (ref 22–32)
Calcium: 9.5 mg/dL (ref 8.9–10.3)
Chloride: 98 mmol/L (ref 98–111)
Creatinine, Ser: 0.53 mg/dL (ref 0.44–1.00)
GFR, Estimated: >60 mL/min (ref >60)
Glucose, Bld: 118 mg/dL — ABNORMAL HIGH (ref 70–99)
Potassium: 3.8 mmol/L (ref 3.5–5.1)
Sodium: 136 mmol/L (ref 135–145)

## 2022-06-25 MED ORDER — MAGNESIUM CITRATE PO SOLN
1.0000 | Freq: Once | ORAL | Status: AC
  Administered 2022-06-25: 1 via ORAL
  Filled 2022-06-25: qty 296

## 2022-06-25 NOTE — Progress Notes (Signed)
Orthopedic Tech Progress Note Patient Details:  GRETELL GELIN [birth year] [id number]  Ortho Devices Type of Ortho Device: Sling immobilizer Ortho Device/Splint Location: LUE Ortho Device/Splint Interventions: Ordered, Application, Adjustment   Post Interventions Patient Tolerated: Well Instructions Provided: Care of device, Adjustment of device   Carmine Savoy 06/25/2022, 2:41 PM

## 2022-06-25 NOTE — Progress Notes (Signed)
Physical Therapy Session Note  Patient Details  Name: Kristin Simmons MRN: [id number] Date of Birth: 1985-10-06  Today's Date: 06/25/2022 PT Individual Time: 1100-1207 PT Individual Time Calculation (min): 67 min   Short Term Goals: Week 1:  PT Short Term Goal 1 (Week 1): Patient will perform bed mobility with MinA PT Short Term Goal 2 (Week 1): Patient will perform bed/chair transfer with ModA x1 PT Short Term Goal 3 (Week 1): Patient will gait train x50' with LRAD and MaxA x1 PT Short Term Goal 4 (Week 1): Patient will be able to tolerate sitting upright in TIS wheelchair between treatment sessions  Skilled Therapeutic Interventions/Progress Updates:  Patient greeted supine, asleep in bed but easily arousable and agreeable to PT treatment session. Patient transitioned from supine to sitting EOB with Mod/MaxA for righting trunk secondary to limited effort due to reports of feeling lethargic. Upon sitting EOB, patient frequently attempting to transition back to supine secondary to fatigue- Reaching out for blankets and pillows to rest her head on. Therapist providing multimodal cues and re-direction for improved engagement and to decrease distractibility. Rehab tech present to provide MinA for sitting balance while seated EOB secondary to L lateral lean- Therapist donned tennis shoes for time management. Patient stood from EOB with Mod/MaxA without the use of an AD and then held onto therapist's arm while performing a stand pivot transfer to TIS wheelchair with Mod/MaxA and therapist managing L LE throughout, especially in stance phase secondary to significant buckling.   Patient transferred to/from [facility] wheelchair and mat table with ModA- Patient able to advance L LE, however required assistance for placement and blocking throughout stance phase.   Patient sat EOM in order to improved dynamic and static sitting balance for improved functional mobility. Patient tolerated sitting EOM ~20 minutes  with a single reclined rest break-  -Reaching outside her BOS to the R with R UE for bean bags and then reaching across her body to place them in the wheelchair, x15 total.  -Reaching across her body with R UE for bean bags in the TIS wheelchair and then tossing them into a basket on the floor, x15 total and made 13/15.  MinA from therapist/rehab tech for improved balance- Encouragement for self righting reactions vs relying on therapist and rehab tech with good effort.   Patient tasked with standing without UE support while reaching with her R UE to the R for squigz on the mirror, pulling them off and placing them in the bucket rehac tech was holding- Completed x10 total with therapist providing Min/ModA for stability and blocking L knee throughout. With increased time standing, patient was able to demonstrate improved postural extension with forward gaze and midline posturing while using the mirror for external visual cues.   Patient gait trained x30' at R hemi-rail with ModA and rehab tech providing a wheelchair follow for safety- Patient demonstrated improved postural extension with forward gaze throughout gait trial and was able to slightly advance L LE, however continues to require assistance for placement and blocking of L knee during stance phase of gait. Therapist ace wrapped L LE for improved dorsiflexion and eversion in order to improve foot clearance throughout swing phase.   Patient requesting to go outside throughout treatment session and was used as a reward/incentive for improved participation in therapy with good results noted. Patient was taken outside for five minutes at end of treatment session. Patient then left reclined in TIS wheelchair in her room with seatbelt and posey belt on,  call bell within reach and all needs met.    Therapy Documentation Precautions:  Precautions Precautions: Fall Precaution Comments: R craniectomy, no bone flap, bone flap placed in abdomen, R hand  mitt, coretrak, flexiseal Restrictions Weight Bearing Restrictions: No  Pain: Reports neck pain and L shoulder pain- RN notified and unable to administer lidocaine patches until later in the day. Physician on call notified and able to put in an order for a sling for L UE and a heating pad for her neck.    Therapy/Group: Individual Therapy     06/25/2022, 7:44 AM

## 2022-06-25 NOTE — Progress Notes (Signed)
Speech Language Pathology Daily Session Note  Patient Details  Name: Kristin Simmons MRN: [id number] Date of Birth: 28-Apr-1985  Today's Date: 06/25/2022 SLP Individual Time: 1300-1355 SLP Individual Time Calculation (min): 55 min  Short Term Goals: Week 1: SLP Short Term Goal 1 (Week 1): Patient will consume current diet with minimal overt s/s of aspiration with supervision supervision level verbal cues for use of swallowing compensatory strategies. SLP Short Term Goal 2 (Week 1): Patient will demonstrate sustained attention to functional tasks for 10 minutes with Mod verbal cues for redirection. SLP Short Term Goal 3 (Week 1): Patient will identify 2 physical and 2 cognitive deficits with Max verbal cues. SLP Short Term Goal 4 (Week 1): Patient will attend to left visual field during functional tasks with Mod A multimodal cues. SLP Short Term Goal 5 (Week 1): Patient will demonstrate functional problem solving for basic and familiar tasks with Mod verbal cues.  Skilled Therapeutic Interventions:   Pt was seen for skilled ST intervention targeting cognition. Pt was in wheel chair upon arrival and asked to be put to bed. NT reported PT encouraged them to keep her up until completion of ST session. SLP assisted in maneuvering WC to decrease pressure on bottom. Pt reported she was tired and wanted to go to bed, but with encouragement, participated in ST session. Pt declined bolus trials this session.   SLP facilitated session by targeting attention through conversational therapy. Pt was able to name her children and ages, discuss her tattoos, and her favorite meal to cook with rare verbal cues for redirection; however, no reciprocation of questions. Pt participated in organization/attention task where she was required to alternate ABCs with SLP while naming foods. She had difficulty following which letter of the alphabet we were on, consistently naming an item beginning with the same letter as the  SLP's (ex: Patient says "Apricot" SLP says "Banana" Pt says "Carrot" etc.). She required modA verbal cues to complete.   SLP assisted pt to bathroom using stedy +2 and then into bed. NSG was alerted of pt pain level (headache).   Pt was left in room, with all safety measures activated, and all immediate needs within reach. Cont with current POC.   Pain Pain Assessment Pain Score: 5  Pain Location: Head Pain Descriptors / Indicators: Headache Pain Onset: On-going Pain Intervention(s): RN made aware  Therapy/Group: Individual Therapy  Dorena Bodo 06/25/2022, 1:58 PM

## 2022-06-25 NOTE — Progress Notes (Signed)
PROGRESS NOTE   Subjective/Complaints: Last BM 5/7: mag citrate ordered, Cr reviewed and normal Did great with PT today! Has some neck pain  ROS:  + R shoulder/neck pain - ongoing + Lethargy/insomnia - ongoing + Abdominal itching - ongoiong Denies fevers, chills, N/V, abdominal pain, diarrhea, SOB, cough, chest pain, new weakness or paraesthesias.    Objective:   No results found. No results for input(s): "WBC", "HGB", "HCT", "PLT" in the last 72 hours. Recent Labs    06/24/22 0600 06/25/22 0618  NA 135 136  K 3.7 3.8  CL 99 98  CO2 27 26  GLUCOSE 103* 118*  BUN 17 14  CREATININE 0.61 0.53  CALCIUM 9.4 9.5    Intake/Output Summary (Last 24 hours) at 06/25/2022 1351 Last data filed at 06/25/2022 0301 Gross per 24 hour  Intake 480 ml  Output 1175 ml  Net -695 ml        Physical Exam: Vital Signs Blood pressure (!) 169/117, pulse 86, temperature 99.1 F (37.3 C), temperature source Oral, resp. rate 17, height 5\' 4"  (1.626 m), weight 53.2 kg, SpO2 100 %. Constitutional: No apparent distress. Appropriate appearance for age.  Seen sitting in wheelchair, in room with many family members around. HENT: No JVD. Neck Supple. Trachea midline. + R craniotomy site- swollen, decresaed from last exam, fluctuant and nontender Eyes: PERRLA. EOMI. Visual fields grossly intact.  Cardiovascular: RRR, no murmurs/rub/gallops. No Edema. Peripheral pulses 2+  Respiratory: CTAB. No rales, rhonchi, or wheezing. On RA.  Abdomen: + bowel sounds, normoactive. No distention or tenderness.  GU: Not examined.   Skin: R craniectomy and R abdominal surgical sites -status post staple removal, well-approximated, CDI MSK:      No apparent deformity.      Strength:                RUE: 5/5 SA, 5/5 EF, 5/5 EE, 5/5 WE, 5/5 FF, 5/5 FA                 LUE:  0/5                RLE: 5/5 HF, 5/5 KE, 5/5 DF, 5/5 EHL, 5/5 PF                 LLE:  1/5  HF, otherwise 0/5  Neurologic exam:  Cognition: AAO to person, place; not time, with options.  +lethargy but responsive to verbal stimuli Language: Fluent, No substitutions or neoglisms. No dysarthria.  Insight: Poor insight into current condition.  Mood: Pleasant affect, appropriate mood.  Sensation: Hypersensitive in left upper extremity and left lower extremity to touch; endorses altered from right side - improved tolerance to touch Reflexes: 2+ in R UE and LE; LUE and LLE hyporeflexic. Negative Hoffman's and babinski signs bilaterally.  CN: + L tongue deviation Coordination: No apparent tremors.  Spasticity: MAS 1 left finger flexors, left hip adductors Psych: flat affect   Assessment/Plan: 1. Functional deficits which require 3+ hours per day of interdisciplinary therapy in a comprehensive inpatient rehab setting. Physiatrist is providing close team supervision and 24 hour management of active medical problems listed below. Physiatrist and rehab team continue  to assess barriers to discharge/monitor patient progress toward functional and medical goals  Care Tool:  Bathing    Body parts bathed by patient: Left arm, Chest, Abdomen, Right upper leg   Body parts bathed by helper: Right arm, Front perineal area, Buttocks, Left upper leg, Right lower leg, Left lower leg, Face     Bathing assist Assist Level: 2 Helpers     Upper Body Dressing/Undressing Upper body dressing   What is the patient wearing?: Pull over shirt    Upper body assist Assist Level: Total Assistance - Patient < 25%    Lower Body Dressing/Undressing Lower body dressing      What is the patient wearing?: Pants     Lower body assist Assist for lower body dressing: Total Assistance - Patient < 25%     Toileting Toileting    Toileting assist Assist for toileting: Total Assistance - Patient < 25%     Transfers Chair/bed transfer  Transfers assist  Chair/bed transfer activity did not occur:  Safety/medical concerns  Chair/bed transfer assist level: Maximal Assistance - Patient 25 - 49%     Locomotion Ambulation   Ambulation assist      Assist level: 2 helpers Assistive device: Other (comment) (Hemi-rail) Max distance: 30'   Walk 10 feet activity   Assist     Assist level: 2 helpers     Walk 50 feet activity   Assist Walk 50 feet with 2 turns activity did not occur: Safety/medical concerns (Patient unable to ambulate >30' at this time secondary to fatigue, poor endurance/activity tolerance, impaired attention, increased pain, etc.)         Walk 150 feet activity   Assist Walk 150 feet activity did not occur: Safety/medical concerns         Walk 10 feet on uneven surface  activity   Assist Walk 10 feet on uneven surfaces activity did not occur: Safety/medical concerns         Wheelchair     Assist Is the patient using a wheelchair?: Yes Type of Wheelchair: Manual (per therapist, pt using TIS/Recline back wheelchair)    Wheelchair assist level: Dependent - Patient 0% Max wheelchair distance: 150'    Wheelchair 50 feet with 2 turns activity    Assist        Assist Level: Dependent - Patient 0%   Wheelchair 150 feet activity     Assist      Assist Level: Dependent - Patient 0%   Blood pressure (!) 169/117, pulse 86, temperature 99.1 F (37.3 C), temperature source Oral, resp. rate 17, height 5\' 4"  (1.626 m), weight 53.2 kg, SpO2 100 %.  1. 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 Conchita Paris. Bone flap in abdominal pocket, plan for subacute flap replacement after IPR             -patient may  shower if cover incisions             -ELOS/Goals: 18-20 days- supervision to min A   Safety precautions due to poor impulse control - Helmet ordered for OOB 5/8; WHO and [facility] 5/9  2.   Antithrombotics: -DVT/anticoagulation:  Pharmaceutical: Lovenox initiated 07/04/2022             -antiplatelet therapy: N/A  3. Pain Management: Oxycodone as needed  5/8: added lidocaine patch x2 to L neck, shoulder  5/9: Add gabapentin  300 mg TID for LUE and LLE pain, likely related to spasticity and neuropathic.   5/10: Mild lethargy today but no use of PRN pain medications; decrease gabapentin to 100 mg TID  5/11: kpad and left shoulder sling ordered  4. Mood/Behavior/Sleep: PRN twice daily as needed provide emotional support             -antipsychotic agents: N/A  - Sleep log added - poor sleep 3 hours last night. Add melatonin 5 mg QHS + PRN trazodone 50-75 mg.   5/10: Slept 7 hours overnight, still endorsing poor sleep.  Did not use as needed trazodone, will add 50 mg standing nightly.  5/11: continue trazodone  5. Neuropsych/cognition: This patient is not capable of making decisions on her own behalf -not consistently oriented, impulsive with poor awareness..   - Obs for safety  6. Skin/Wound Care: Routine skin checks  - 5/8: 3 weeks post-R craniectomy; ordered staple removal from scalp and abdomen. no apparent wounds will DC rectal tube    7. Fluids/Electrolytes/Nutrition/Dysphagia: Routine in and outs with follow-up chemistries Diet Orders (From admission, onward)     Start     Ordered   06/22/22 0845  DIET DYS 3 Room service appropriate? Yes with Assist; Fluid consistency: Thin; Fluid restriction: 1500 mL Fluid  Diet effective now       Comments: Meds whole with liquid; full supervision with meals  Question Answer Comment  Room service appropriate? Yes with Assist   Fluid consistency: Thin   Fluid restriction: 1500 mL Fluid      06/22/22 0844          -5-10: If continues with p.o.'s greater than 50% of meals over the weekend, can likely DC core track.  8.  Hyponatremia.  Continue sodium chloride tablets.  Florinef 0.3 mg 3 times daily  - 5/8: Na 134 this AM.  Downtrending over last 2 days. No obvious medication contributors; Already on salt tabs 2g TID. Placed 1500 ml fluid restriction, repeat today 138. Ordered daily BMP x3 days for monitoring. Reduce to Florinef 0.1 mg TID and monitor.   5/9: Na stable 13. Diastolic BP improved, remains high systolic. Orthostatic vitals negative with BP. Given florinef indication is hyponatremia not orthostasis will continue 0.1 mg TID through Friday then DC if no significant drop in Na.   5-10: Na down to 135 with mildly increased lethargy.  Increase Florinef to 0.2 mg 3 times daily, continue daily BMP for 3 days.    10.  Decreased nutritional storage.  Currently on a dysphagia #3 diet.  Nasogastric tube for nutritional support.  Dietary follow-up  11.  Urinary retention.  Urecholine 5 mg 3 times daily.  Check PVR  5/8: No PVRs overnight; ordered. Mixed continence overnight.   5/9: Continue UO and low PVRs. DC urecholine.   5/10: PVRs remain low, continent.  Continue for 24 hours, then DC PVRs.  12.  History of tobacco/alcohol use.  Counseling  13. Diarrhea. Remove rectal tube 5/8. Increase banatrol to TID.     - 5/9: No recorded Bms since rectal tube removal; reduce banatrol back to BID, obtain KUB  - 5/10: DC banatrol. KUB with mild stool burden.  If no BM today, will need to start laxatives.   5/11: magnesium citrate ordered  14. HTN: magnesium citrate ordered  LOS: 4 days A FACE TO FACE EVALUATION WAS PERFORMED  Clint Bolder P  06/25/2022, 1:51 PM

## 2022-06-26 LAB — GLUCOSE, CAPILLARY
Glucose-Capillary: 96 mg/dL (ref 70–99)
Glucose-Capillary: 106 mg/dL — ABNORMAL HIGH (ref 70–99)
Glucose-Capillary: 83 mg/dL (ref 70–99)
Glucose-Capillary: 97 mg/dL (ref 70–99)

## 2022-06-26 MED ORDER — FLUDROCORTISONE ACETATE 0.1 MG PO TABS
0.1000 mg | ORAL_TABLET | Freq: Three times a day (TID) | ORAL | Status: DC
  Administered 2022-06-26 – 2022-06-27 (×4): 0.1 mg via ORAL
  Filled 2022-06-26 (×5): qty 1

## 2022-06-26 NOTE — Progress Notes (Signed)
PROGRESS NOTE   Subjective/Complaints: She would like to get NGT removed, discussed with nursing and she has been eating 25-50% of meals and trying to consume the most nutritious foods  ROS:  + R shoulder/neck pain - ongoing + Lethargy/insomnia - ongoing + Abdominal itching - ongoiong Denies fevers, chills, N/V, abdominal pain, diarrhea, SOB, cough, chest pain, new weakness or paraesthesias.   Would like NGT removed  Objective:   No results found. No results for input(s): "WBC", "HGB", "HCT", "PLT" in the last 72 hours. Recent Labs    06/24/22 0600 06/25/22 0618  NA 135 136  K 3.7 3.8  CL 99 98  CO2 27 26  GLUCOSE 103* 118*  BUN 17 14  CREATININE 0.61 0.53  CALCIUM 9.4 9.5    Intake/Output Summary (Last 24 hours) at 06/26/2022 0959 Last data filed at 06/26/2022 0830 Gross per 24 hour  Intake 420 ml  Output 350 ml  Net 70 ml        Physical Exam: Vital Signs Blood pressure (!) 141/101, pulse 91, temperature 97.9 F (36.6 C), temperature source Oral, resp. rate 15, height 5\' 4"  (1.626 m), weight 53.2 kg, SpO2 100 %. Constitutional: No apparent distress. Appropriate appearance for age.  Seen sitting in wheelchair, in room with many family members around. HENT: No JVD. Neck Supple. Trachea midline. + R craniotomy site- swollen, decresaed from last exam, fluctuant and nontender, +NGT Eyes: PERRLA. EOMI. Visual fields grossly intact.  Cardiovascular: RRR, no murmurs/rub/gallops. No Edema. Peripheral pulses 2+  Respiratory: CTAB. No rales, rhonchi, or wheezing. On RA.  Abdomen: + bowel sounds, normoactive. No distention or tenderness.  GU: Not examined.   Skin: R craniectomy and R abdominal surgical sites -status post staple removal, well-approximated, CDI MSK:      No apparent deformity.      Strength:                RUE: 5/5 SA, 5/5 EF, 5/5 EE, 5/5 WE, 5/5 FF, 5/5 FA                 LUE:  0/5                 RLE: 5/5 HF, 5/5 KE, 5/5 DF, 5/5 EHL, 5/5 PF                 LLE:  1/5 HF, otherwise 0/5  Neurologic exam:  Cognition: AAO to person, place; not time, with options.  +lethargy but responsive to verbal stimuli Language: Fluent, No substitutions or neoglisms. No dysarthria.  Insight: Poor insight into current condition.  Mood: Pleasant affect, appropriate mood.  Sensation: Hypersensitive in left upper extremity and left lower extremity to touch; endorses altered from right side - improved tolerance to touch Reflexes: 2+ in R UE and LE; LUE and LLE hyporeflexic. Negative Hoffman's and babinski signs bilaterally.  CN: + L tongue deviation Coordination: No apparent tremors.  Spasticity: MAS 1 left finger flexors, left hip adductors Psych: flat affect   Assessment/Plan: 1. Functional deficits which require 3+ hours per day of interdisciplinary therapy in a comprehensive inpatient rehab setting. Physiatrist is providing close team supervision and 24 hour  management of active medical problems listed below. Physiatrist and rehab team continue to assess barriers to discharge/monitor patient progress toward functional and medical goals  Care Tool:  Bathing    Body parts bathed by patient: Left arm, Chest, Abdomen, Right upper leg   Body parts bathed by helper: Right arm, Front perineal area, Buttocks, Left upper leg, Right lower leg, Left lower leg, Face     Bathing assist Assist Level: 2 Helpers     Upper Body Dressing/Undressing Upper body dressing   What is the patient wearing?: Pull over shirt    Upper body assist Assist Level: Total Assistance - Patient < 25%    Lower Body Dressing/Undressing Lower body dressing      What is the patient wearing?: Pants     Lower body assist Assist for lower body dressing: Total Assistance - Patient < 25%     Toileting Toileting    Toileting assist Assist for toileting: Total Assistance - Patient < 25%     Transfers Chair/bed  transfer  Transfers assist  Chair/bed transfer activity did not occur: Safety/medical concerns  Chair/bed transfer assist level: Maximal Assistance - Patient 25 - 49%     Locomotion Ambulation   Ambulation assist      Assist level: 2 helpers Assistive device: Other (comment) (Hemi-rail) Max distance: 30'   Walk 10 feet activity   Assist     Assist level: 2 helpers     Walk 50 feet activity   Assist Walk 50 feet with 2 turns activity did not occur: Safety/medical concerns (Patient unable to ambulate >30' at this time secondary to fatigue, poor endurance/activity tolerance, impaired attention, increased pain, etc.)         Walk 150 feet activity   Assist Walk 150 feet activity did not occur: Safety/medical concerns         Walk 10 feet on uneven surface  activity   Assist Walk 10 feet on uneven surfaces activity did not occur: Safety/medical concerns         Wheelchair     Assist Is the patient using a wheelchair?: Yes Type of Wheelchair: Manual (per therapist, pt using TIS/Recline back wheelchair)    Wheelchair assist level: Dependent - Patient 0% Max wheelchair distance: 150'    Wheelchair 50 feet with 2 turns activity    Assist        Assist Level: Dependent - Patient 0%   Wheelchair 150 feet activity     Assist      Assist Level: Dependent - Patient 0%   Blood pressure (!) 141/101, pulse 91, temperature 97.9 F (36.6 C), temperature source Oral, resp. rate 15, height 5\' 4"  (1.626 m), weight 53.2 kg, SpO2 100 %.  1. 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 Conchita Paris. Bone flap in abdominal pocket, plan for subacute flap replacement after IPR             -patient may  shower if cover incisions             -ELOS/Goals: 18-20 days- supervision to min A   Safety precautions due to poor impulse  control - Helmet ordered for OOB 5/8; WHO and [facility] 5/9  Continue CIR 2.  Antithrombotics: -DVT/anticoagulation:  Pharmaceutical: Lovenox initiated 07/04/2022             -antiplatelet therapy: N/A  3. Pain Management: Oxycodone as needed  5/8: added lidocaine patch x2 to L neck, shoulder  5/9: Add gabapentin 300 mg TID for LUE and LLE pain, likely related to spasticity and neuropathic.   5/10: Mild lethargy today but no use of PRN pain medications; decrease gabapentin to 100 mg TID  5/11: kpad and left shoulder sling ordered  4. Mood/Behavior/Sleep: PRN twice daily as needed provide emotional support             -antipsychotic agents: N/A  - Sleep log added - poor sleep 3 hours last night. Add melatonin 5 mg QHS + PRN trazodone 50-75 mg.   5/10: Slept 7 hours overnight, still endorsing poor sleep.  Did not use as needed trazodone, will add 50 mg standing nightly.  5/11: continue trazodone  5. Neuropsych/cognition: This patient is not capable of making decisions on her own behalf -not consistently oriented, impulsive with poor awareness..   - Obs for safety  6. Skin/Wound Care: Routine skin checks  - 5/8: 3 weeks post-R craniectomy; ordered staple removal from scalp and abdomen. no apparent wounds will DC rectal tube    7. Dysphagia: placed order for Cortrak removal Diet Orders (From admission, onward)     Start     Ordered   06/22/22 0845  DIET DYS 3 Room service appropriate? Yes with Assist; Fluid consistency: Thin; Fluid restriction: 1500 mL Fluid  Diet effective now       Comments: Meds whole with liquid; full supervision with meals  Question Answer Comment  Room service appropriate? Yes with Assist   Fluid consistency: Thin   Fluid restriction: 1500 mL Fluid      06/22/22 0844            8.  Hyponatremia.  Continue sodium chloride tablets.  Florinef 0.3 mg 3 times daily  - 5/8: Na 134 this AM. Downtrending over last 2 days. No obvious medication contributors; Already  on salt tabs 2g TID. Placed 1500 ml fluid restriction, repeat today 138. Ordered daily BMP x3 days for monitoring. Reduce to Florinef 0.1 mg TID and monitor.   5/9: Na stable 13. Diastolic BP improved, remains high systolic. Orthostatic vitals negative with BP. Given florinef indication is hyponatremia not orthostasis will continue 0.1 mg TID through Friday then DC if no significant drop in Na.   5-10: Na down to 135 with mildly increased lethargy.  Increase Florinef to 0.2 mg 3 times daily, continue daily BMP for 3 days.  5/12: Na improved to 136, decrease Florinef to 0.1mg  TID given hypertension  10.  Decreased nutritional storage.  Currently on a dysphagia #3 diet.  NGT d/ced  11.  Urinary retention.  Urecholine 5 mg 3 times daily.  Check PVR  5/8: No PVRs overnight; ordered. Mixed continence overnight.   5/9: Continue UO and low PVRs. DC urecholine.   5/10: PVRs remain low, continent.  Continue for 24 hours, then DC PVRs.  12.  History of tobacco/alcohol use.  Counseling  13. Diarrhea. Remove rectal tube 5/8. Increase banatrol to TID.     - 5/9: No recorded Bms since rectal tube removal; reduce banatrol back to BID, obtain KUB  - 5/10: DC banatrol. KUB with mild stool burden.  If no BM today, will need to start laxatives.   5/11: magnesium citrate ordered  5/12: flowsheet reviewd and had type 5 stools today.   14. HTN: magnesium citrate ordered, likely elevated due to Florinef, decrease to 1 tab TID  LOS: 5 days A FACE TO FACE EVALUATION WAS  PERFORMED  Horton Chin 06/26/2022, 9:59 AM

## 2022-06-26 NOTE — Progress Notes (Signed)
Cortrak removed per order, patient tolerated well. 

## 2022-06-27 LAB — CBC WITH DIFFERENTIAL/PLATELET
Abs Immature Granulocytes: 0.12 10*3/uL — ABNORMAL HIGH (ref 0.00–0.07)
Basophils Absolute: 0.1 10*3/uL (ref 0.0–0.1)
Basophils Relative: 1 %
Eosinophils Absolute: 0.2 10*3/uL (ref 0.0–0.5)
Eosinophils Relative: 2 %
HCT: 30.5 % — ABNORMAL LOW (ref 36.0–46.0)
Hemoglobin: 10.1 g/dL — ABNORMAL LOW (ref 12.0–15.0)
Immature Granulocytes: 2 %
Lymphocytes Relative: 25 %
Lymphs Abs: 1.6 10*3/uL (ref 0.7–4.0)
MCH: 30.4 pg (ref 26.0–34.0)
MCHC: 33.1 g/dL (ref 30.0–36.0)
MCV: 91.9 fL (ref 80.0–100.0)
Monocytes Absolute: 0.4 10*3/uL (ref 0.1–1.0)
Monocytes Relative: 7 %
Neutro Abs: 4 10*3/uL (ref 1.7–7.7)
Neutrophils Relative %: 63 %
Platelets: 448 10*3/uL — ABNORMAL HIGH (ref 150–400)
RBC: 3.32 MIL/uL — ABNORMAL LOW (ref 3.87–5.11)
RDW: 14.7 % (ref 11.5–15.5)
WBC: 6.3 10*3/uL (ref 4.0–10.5)
nRBC: 0 % (ref 0.0–0.2)

## 2022-06-27 LAB — BASIC METABOLIC PANEL
Anion gap: 12 (ref 5–15)
BUN: 13 mg/dL (ref 6–20)
CO2: 25 mmol/L (ref 22–32)
Calcium: 9.8 mg/dL (ref 8.9–10.3)
Chloride: 103 mmol/L (ref 98–111)
Creatinine, Ser: 0.71 mg/dL (ref 0.44–1.00)
GFR, Estimated: >60 mL/min (ref >60)
Glucose, Bld: 87 mg/dL (ref 70–99)
Potassium: 3.8 mmol/L (ref 3.5–5.1)
Sodium: 140 mmol/L (ref 135–145)

## 2022-06-27 LAB — GLUCOSE, CAPILLARY
Glucose-Capillary: 85 mg/dL (ref 70–99)
Glucose-Capillary: 71 mg/dL (ref 70–99)
Glucose-Capillary: 89 mg/dL (ref 70–99)
Glucose-Capillary: 77 mg/dL (ref 70–99)

## 2022-06-27 MED ORDER — AMLODIPINE BESYLATE 5 MG PO TABS
5.0000 mg | ORAL_TABLET | Freq: Every day | ORAL | Status: DC
  Administered 2022-06-27 – 2022-06-30 (×4): 5 mg via ORAL
  Filled 2022-06-27 (×4): qty 1

## 2022-06-27 MED ORDER — FLUDROCORTISONE ACETATE 0.1 MG PO TABS
0.1000 mg | ORAL_TABLET | Freq: Two times a day (BID) | ORAL | Status: DC
  Administered 2022-06-28: 0.1 mg via ORAL
  Filled 2022-06-27 (×2): qty 1

## 2022-06-27 NOTE — Progress Notes (Signed)
PROGRESS NOTE   Subjective/Complaints: Patient seen in bed.  Continues to have some pain in her right neck and shoulder, and some difficulty sleeping.  Overall, states that things are improving.  SLP in the room, states "she is doing great with attention".  ROS:  + R shoulder/neck pain - ongoing + Lethargy/insomnia - ongoing + Abdominal itching - ongoiong Denies fevers, chills, N/V, abdominal pain, diarrhea, SOB, cough, chest pain, new weakness or paraesthesias.    Objective:   No results found. No results for input(s): "WBC", "HGB", "HCT", "PLT" in the last 72 hours. Recent Labs    06/25/22 0618  NA 136  K 3.8  CL 98  CO2 26  GLUCOSE 118*  BUN 14  CREATININE 0.53  CALCIUM 9.5     Intake/Output Summary (Last 24 hours) at 06/27/2022 0801 Last data filed at 06/27/2022 0300 Gross per 24 hour  Intake 540 ml  Output 250 ml  Net 290 ml         Physical Exam: Vital Signs Blood pressure (!) 140/96, pulse 96, temperature 98.4 F (36.9 C), temperature source Oral, resp. rate 18, height 5\' 4"  (1.626 m), weight 53.2 kg, SpO2 95 %. Constitutional: No apparent distress. Appropriate appearance for age.  Up and right in bed, with SLP at bedside. HENT: No JVD. Neck Supple. Trachea midline. + R craniotomy site- swollen-  reduced  eyes: PERRLA. EOMI. Visual fields grossly intact.  Cardiovascular: RRR, no murmurs/rub/gallops. No Edema. Peripheral pulses 2+  Respiratory: CTAB. No rales, rhonchi, or wheezing. On RA.  Abdomen: + bowel sounds, normoactive. No distention or tenderness.  Core track removed GU: Not examined.   Skin: R craniectomy and R abdominal surgical sites -status post staple removal, well-approximated, CDI MSK:      No apparent deformity.      Strength:                RUE: 5/5 SA, 5/5 EF, 5/5 EE, 5/5 WE, 5/5 FF, 5/5 FA                 LUE:  0/5                RLE: 5/5 HF, 5/5 KE, 5/5 DF, 5/5 EHL, 5/5 PF                  LLE:  1/5 HF, otherwise 0/5  Neurologic exam:  Cognition: AAO to person, place and time. Language: Fluent, No substitutions or neoglisms. No dysarthria.  + Some mild impulsivity of language, disinhibition Improving speed of cognition.  Insight: Poor insight into current condition.  Mood: Pleasant affect, appropriate mood.  Sensation: Hypersensitive in left upper extremity and left lower extremity to touch; endorses altered from right side - improved since last exam Reflexes: 2+ in R UE and LE; LUE and LLE hyporeflexic. Negative Hoffman's and babinski signs bilaterally.  CN: + L tongue deviation Coordination: No apparent tremors.  Spasticity: MAS 1 left finger flexors, left hip adductors -ongoing Psych: Appropriate mood, mildly labile affect.   Assessment/Plan: 1. Functional deficits which require 3+ hours per day of interdisciplinary therapy in a comprehensive inpatient rehab setting. Physiatrist is providing close team  supervision and 24 hour management of active medical problems listed below. Physiatrist and rehab team continue to assess barriers to discharge/monitor patient progress toward functional and medical goals  Care Tool:  Bathing    Body parts bathed by patient: Left arm, Chest, Abdomen, Right upper leg   Body parts bathed by helper: Right arm, Front perineal area, Buttocks, Left upper leg, Right lower leg, Left lower leg, Face     Bathing assist Assist Level: 2 Helpers     Upper Body Dressing/Undressing Upper body dressing   What is the patient wearing?: Pull over shirt    Upper body assist Assist Level: Total Assistance - Patient < 25%    Lower Body Dressing/Undressing Lower body dressing      What is the patient wearing?: Pants     Lower body assist Assist for lower body dressing: Total Assistance - Patient < 25%     Toileting Toileting    Toileting assist Assist for toileting: Total Assistance - Patient < 25%     Transfers Chair/bed  transfer  Transfers assist  Chair/bed transfer activity did not occur: Safety/medical concerns  Chair/bed transfer assist level: Maximal Assistance - Patient 25 - 49%     Locomotion Ambulation   Ambulation assist      Assist level: 2 helpers Assistive device: Other (comment) (Hemi-rail) Max distance: 30'   Walk 10 feet activity   Assist     Assist level: 2 helpers     Walk 50 feet activity   Assist Walk 50 feet with 2 turns activity did not occur: Safety/medical concerns (Patient unable to ambulate >30' at this time secondary to fatigue, poor endurance/activity tolerance, impaired attention, increased pain, etc.)         Walk 150 feet activity   Assist Walk 150 feet activity did not occur: Safety/medical concerns         Walk 10 feet on uneven surface  activity   Assist Walk 10 feet on uneven surfaces activity did not occur: Safety/medical concerns         Wheelchair     Assist Is the patient using a wheelchair?: Yes Type of Wheelchair: Manual (per therapist, pt using TIS/Recline back wheelchair)    Wheelchair assist level: Dependent - Patient 0% Max wheelchair distance: 150'    Wheelchair 50 feet with 2 turns activity    Assist        Assist Level: Dependent - Patient 0%   Wheelchair 150 feet activity     Assist      Assist Level: Dependent - Patient 0%   Blood pressure (!) 140/96, pulse 96, temperature 98.4 F (36.9 C), temperature source Oral, resp. rate 18, height 5\' 4"  (1.626 m), weight 53.2 kg, SpO2 95 %.  1. 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 Conchita Paris. Bone flap in abdominal pocket, plan for subacute flap replacement after IPR             -patient may  shower if cover incisions             -ELOS/Goals: 18-20 days- supervision to min A   Safety precautions due to poor impulse  control - Helmet ordered for OOB 5/8; [facility] and [facility] 5/9  Continue CIR  2.  Antithrombotics: -DVT/anticoagulation:  Pharmaceutical: Lovenox initiated 07/04/2022             -antiplatelet therapy: N/A  3.  Pain Management: Oxycodone as needed  5/8: added lidocaine patch x2 to L neck, shoulder  5/9: Add gabapentin 300 mg TID for LUE and LLE pain, likely related to spasticity and neuropathic.   5/10: Mild lethargy today but no use of PRN pain medications; decrease gabapentin to 100 mg TID  5/11: kpad and left shoulder sling ordered  5/23: Using oxycodone Q4H; continue  4. Mood/Behavior/Sleep: PRN twice daily as needed provide emotional support             -antipsychotic agents: N/A  - Sleep log added - poor sleep 3 hours last night. Add melatonin 5 mg QHS + PRN trazodone 50-75 mg.   5/10: Slept 7 hours overnight, still endorsing poor sleep.  Did not use as needed trazodone, will add 50 mg standing nightly.  5/11: continue trazodone  5/13: Per log slept 5 hours overnight, however was not updated 5-10: Will recheck tomorrow  5. Neuropsych/cognition: This patient is not capable of making decisions on her own behalf -not consistently oriented, impulsive with poor awareness..   - Obs for safety  6. Skin/Wound Care: Routine skin checks  - 5/8: 3 weeks post-R craniectomy; ordered staple removal from scalp and abdomen. no apparent wounds will DC rectal tube    7. Dysphagia: placed order for Cortrak removal 5/12 Diet Orders (From admission, onward)     Start     Ordered   06/22/22 0845  DIET DYS 3 Room service appropriate? Yes with Assist; Fluid consistency: Thin; Fluid restriction: 1500 mL Fluid  Diet effective now       Comments: Meds whole with liquid; full supervision with meals  Question Answer Comment  Room service appropriate? Yes with Assist   Fluid consistency: Thin   Fluid restriction: 1500 mL Fluid      06/22/22 0844          5/13: Monitor POs with cortrak removal; may need  calorie counts  8.  Hyponatremia.  Continue sodium chloride tablets.  Florinef 0.3 mg 3 times daily  - 5/8: Na 134 this AM. Downtrending over last 2 days. No obvious medication contributors; Already on salt tabs 2g TID. Placed 1500 ml fluid restriction, repeat today 138. Ordered daily BMP x3 days for monitoring. Reduce to Florinef 0.1 mg TID and monitor.   5/9: Na stable 13. Diastolic BP improved, remains high systolic. Orthostatic vitals negative with BP. Given florinef indication is hyponatremia not orthostasis will continue 0.1 mg TID through Friday then DC if no significant drop in Na.   5-10: Na down to 135 with mildly increased lethargy.  Increase Florinef to 0.2 mg 3 times daily, continue daily BMP for 3 days.  5/12: Na improved to 136, decrease Florinef to 0.1mg  TID given hypertension  5/13: NA 136 with decreased Florinef; reduced dosing to twice daily and recheck BMP Wednesday, then if stable will discontinue  10.  Decreased nutritional storage.  Currently on a dysphagia #3 diet.  NGT d/ced  11.  Urinary retention.  Urecholine 5 mg 3 times daily.  Check PVR - resolved  5/8: No PVRs overnight; ordered. Mixed continence overnight.   5/9: Continue UO and low PVRs. DC urecholine.   5/10: PVRs remain low, continent.  Continue for 24 hours, then DC PVRs.  12.  History of tobacco/alcohol use.  Counseling  13. Diarrhea. Remove rectal tube 5/8. Increase banatrol to TID.  resolved   - 5/9: No recorded Bms since rectal tube removal; reduce banatrol back to BID, obtain KUB  -  5/10: DC banatrol. KUB with mild stool burden.  If no BM today, will need to start laxatives.   5/11: magnesium citrate ordered  5/12: flowsheet reviewd and had type 5 stools today.   5/13: soft stools, last BM 5/12, montior  14. HTN: magnesium citrate ordered, likely elevated due to Florinef, decrease to 1 tab TID    06/27/2022   12:57 PM 06/27/2022    5:27 AM 06/26/2022    7:54 PM  Vitals with BMI  Systolic 174 140  174  Diastolic 114 96 111  Pulse 83 96 88    - 5/13: PRN hydralazine used 1x overnight; BP remains significantly elevated with decreased Florinef.  Add amlodipine 5 mg daily.  LOS: 6 days A FACE TO FACE EVALUATION WAS PERFORMED  Angelina Sheriff 06/27/2022, 8:01 AM

## 2022-06-27 NOTE — Progress Notes (Signed)
Physical Therapy Session Note  Patient Details  Name: Kristin Simmons MRN: [id number] Date of Birth: [birth year]  Today's Date: 06/27/2022 PT Individual Time: 1300-1415 PT Individual Time Calculation (min): 75 min   Short Term Goals: Week 1:  PT Short Term Goal 1 (Week 1): Patient will perform bed mobility with MinA PT Short Term Goal 2 (Week 1): Patient will perform bed/chair transfer with ModA x1 PT Short Term Goal 3 (Week 1): Patient will gait train x50' with LRAD and MaxA x1 PT Short Term Goal 4 (Week 1): Patient will be able to tolerate sitting upright in TIS wheelchair between treatment sessions  Skilled Therapeutic Interventions/Progress Updates:  Patient greeted sitting reclined in TIS wheelchair and on the phone with family, however agreeable to PT treatment session. Therapist donned socks and tennis shoes for time management. Patient wheeled to hallway outside of main gym for time management and energy conservation.   Patient performed various stands for gait training with R HR and CGA/MinA with therapist facilitating improved L TKE throughout stand.   Patient gait trained 3 x 30' with R HR and Min/ModA- L LE was ace wrapped into dorsiflexion and eversion in order to improve swing phase of gait. Patient was able to advance L LE throughout swing phase, however required assistance for blocking L knee in order to prevent buckling but also protect from hyperextension. Mirror placed in front of the patient for external visual cues regarding posturing.   Patient performed standing activities without UE support in order to improved standing balance and facilitate L lateral weight shift and improved L TKE/stability- -Patient reached to her R with R UE for bean bags on a tray table and then tossed them at the basket, x18 total while therapist managed L LE and provided TC at chest for improved postural extension.  -Patient performed the same activity above, however this time she reached  across her body with R UE for the bean bags in order to facilitate improved WB through L LE, x18 total.   Patient returned to her room and performed sit/stand with ModA and then stand pivot transfer to EOB without the use of an AD and ModA with therapist blocking L knee throughout stance phase. Patient transitioned from sitting EOB to supine with MinA for L LE management. Patient left supine in bed with heat pad on her back, L UE propped up, call bell within reach, bed alarm on and all needs met.    Therapy Documentation Precautions:  Precautions Precautions: Fall Precaution Comments: R craniectomy, no bone flap, bone flap placed in abdomen, R hand mitt, coretrak, flexiseal Restrictions Weight Bearing Restrictions: No  Pain: Patient reporting R eye pain and L shoulder pain- Eye pain improves with repositioning of helmet and L shoulder pain improves with positioning and sling.    Therapy/Group: Individual Therapy     06/27/2022, 7:44 AM

## 2022-06-27 NOTE — Progress Notes (Signed)
Nutrition Follow-up  DOCUMENTATION CODES:   Not applicable  INTERVENTION:  - Continue Dys 3 diet.   NUTRITION DIAGNOSIS:   Inadequate oral intake related to poor appetite as evidenced by meal completion < 50%.  GOAL:   Patient will meet greater than or equal to 90% of their needs  MONITOR:   PO intake, TF tolerance  REASON FOR ASSESSMENT:   Consult Enteral/tube feeding initiation and management  ASSESSMENT:   37 y.o. female admits to CIR related to functional deficits in setting of s/p right craniectomy. PMH includes: asthma, cancer, HTN, cervical cancer.  Meds reviewed: Risaquad, folic acid, sliding scale insulin, thiamine. Labs reviewed: WDL.   Pt had Cortrak pulled yesterday. Pt is tolerating Dys 3 diet and has been eating mostly 50-100% of her meals.  RD will continue to monitor PO intakes.   Diet Order:   Diet Order             DIET DYS 3 Room service appropriate? Yes with Assist; Fluid consistency: Thin; Fluid restriction: 1500 mL Fluid  Diet effective now                   EDUCATION NEEDS:   Not appropriate for education at this time  Skin:  Skin Assessment: Reviewed RN Assessment  Last BM:  5/12 - type 6  Height:   Ht Readings from Last 1 Encounters:  06/22/22 5\' 4"  (1.626 m)    Weight:   Wt Readings from Last 1 Encounters:  06/22/22 53.2 kg    Ideal Body Weight:     BMI:  Body mass index is 20.13 kg/m.  Estimated Nutritional Needs:   Kcal:  1800-2000  Protein:  85-100  Fluid:  > 1.8 L/day  Bethann Humble, RD, LDN, CNSC.

## 2022-06-27 NOTE — Progress Notes (Signed)
Patient ID: Kristin Simmons, female   DOB: 1985-07-16, 37 y.o.   MRN: [id number]  SW received updates from Kristin Simmons/DAP Disability Specialist with [facility] confirming referral received.   *SW spoke with Kristin Simmons/[place] [facility] to submit CAP/DA referral.   Cecile Sheerer, MSW, LCSWA Office: [phone] Cell: [phone] Fax: [phone]

## 2022-06-27 NOTE — Progress Notes (Signed)
Occupational Therapy Session Note  Patient Details  Name: Kristin Simmons MRN: [id number] Date of Birth: 1985-04-27  Today's Date: 06/27/2022 OT Individual Time: 1102-1202 OT Individual Time Calculation (min): 60 min    Short Term Goals: Week 1:  OT Short Term Goal 1 (Week 1): Pt will sit EOM with MIN-MOD A for 5 min during funcitoanl task in prep for BADL OT Short Term Goal 2 (Week 1): Pt will locate items on L of sink with mod cuing OT Short Term Goal 3 (Week 1): Pt will verbalize 1 step of hemi dressing technique OT Short Term Goal 4 (Week 1): pt will demo improved awareness/decreased impulsivity by waiting for OT to transfer wiht no more than min cuing throughout session  Skilled Therapeutic Interventions/Progress Updates:    Pt greeted asleep in bed. Needed total A to get to sitting EOB in order to wake up. Pt agreeable to shower today. Stand-pivot to TIS wc with mod A. Stedy used to transfer pt into roll in shower chair with min A to stand, but +2 needed as 1 person needed to steer the [facility] and 1 needed to assist with maintaining upright position. Bathing completed with max A and hand over hand to integrate L UE for neuro re-ed. Pt with no activation noted in L UE. OT educated on hemi dressing strategies with focus on attention L and L body awareness. Max A for UB dressing. Pt with poor carryover when we tried to practice again. Pt tangential throughout session and needed cues to stay on task. Pt brushed hair seated in wc with encouragement to use mirror feedback to brush L side. OT placed SAEBO e-stim on wrist extensors. Marland Kitchen SAEBO left on for 60 minutes. OT returned to remove SAEBO with skin intact and no adverse reactions.  [place] Stim One 330 pulse width 35 Hz pulse rate On 8 sec/ off 8 sec Ramp up/ down 2 sec Symmetrical Biphasic wave form  Max intensity at 500 Ohm load  Pt left seated in TIS wc with alarm on, call bell in reach, and needs met.   Therapy  Documentation Precautions:  Precautions Precautions: Fall Precaution Comments: R craniectomy, no bone flap, bone flap placed in abdomen, R hand mitt, coretrak, flexiseal Restrictions Weight Bearing Restrictions: No Pain: Pain Assessment Pain Scale: 0-10 Pain Score: 0-No pain    Therapy/Group: Individual Therapy  Mal Amabile 06/27/2022, 11:53 AM

## 2022-06-27 NOTE — Progress Notes (Signed)
Speech Language Pathology Daily Session Note  Patient Details  Name: Kristin Simmons MRN: [id number] Date of Birth: 17-May-1985  Today's Date: 06/27/2022 SLP Individual Time: 0810-0905 SLP Individual Time Calculation (min): 55 min  Short Term Goals: Week 1: SLP Short Term Goal 1 (Week 1): Patient will consume current diet with minimal overt s/s of aspiration with supervision supervision level verbal cues for use of swallowing compensatory strategies. SLP Short Term Goal 2 (Week 1): Patient will demonstrate sustained attention to functional tasks for 10 minutes with Mod verbal cues for redirection. SLP Short Term Goal 3 (Week 1): Patient will identify 2 physical and 2 cognitive deficits with Max verbal cues. SLP Short Term Goal 4 (Week 1): Patient will attend to left visual field during functional tasks with Mod A multimodal cues. SLP Short Term Goal 5 (Week 1): Patient will demonstrate functional problem solving for basic and familiar tasks with Mod verbal cues.  Skilled Therapeutic Interventions: Skilled treatment session focused on cognitive goals. Upon arrival, patient was awake while upright in bed. SLP facilitated session by providing a mildly complex medication management task in which patient had read directions for medication administration and identify medication administration errors in both a BID and TID pill box. Patient required Mod verbal and visual cues for problem solving but only Min verbal cues for left visual scanning and sustained attention for ~30 minutes throughout task. Patient left upright in bed with alarm on and all needs within reach. Continue with current plan of care.      Pain No/Denies Pain   Therapy/Group: Individual Therapy  ,  06/27/2022, 3:31 PM

## 2022-06-28 LAB — GLUCOSE, CAPILLARY: Glucose-Capillary: 91 mg/dL (ref 70–99)

## 2022-06-28 MED ORDER — HYDRALAZINE HCL 10 MG PO TABS: 10.0000 mg | ORAL_TABLET | Freq: Three times a day (TID) | ORAL | Status: DC

## 2022-06-28 MED ORDER — LISINOPRIL 5 MG PO TABS
5.0000 mg | ORAL_TABLET | Freq: Every day | ORAL | Status: DC
  Administered 2022-06-28 – 2022-06-29 (×2): 5 mg via ORAL
  Filled 2022-06-28 (×2): qty 1

## 2022-06-28 MED ORDER — GABAPENTIN 400 MG PO CAPS
400.0000 mg | ORAL_CAPSULE | Freq: Every day | ORAL | Status: DC
  Administered 2022-06-28 – 2022-06-29 (×2): 400 mg via ORAL
  Filled 2022-06-28 (×2): qty 1

## 2022-06-28 MED ORDER — GABAPENTIN 100 MG PO CAPS
200.0000 mg | ORAL_CAPSULE | Freq: Two times a day (BID) | ORAL | Status: DC
  Administered 2022-06-29 – 2022-07-04 (×12): 200 mg via ORAL
  Filled 2022-06-28 (×12): qty 2

## 2022-06-28 MED ORDER — OXYCODONE HCL 5 MG PO TABS
5.0000 mg | ORAL_TABLET | Freq: Two times a day (BID) | ORAL | Status: DC | PRN
  Administered 2022-06-29 – 2022-07-04 (×7): 5 mg via ORAL
  Filled 2022-06-28 (×7): qty 1

## 2022-06-28 MED ORDER — ACETAMINOPHEN 500 MG PO TABS
1000.0000 mg | ORAL_TABLET | Freq: Three times a day (TID) | ORAL | Status: DC
  Administered 2022-06-28 – 2022-07-04 (×19): 1000 mg via ORAL
  Filled 2022-06-28 (×19): qty 2

## 2022-06-28 MED ORDER — OXYCODONE HCL 5 MG PO TABS
5.0000 mg | ORAL_TABLET | Freq: Three times a day (TID) | ORAL | Status: DC
  Administered 2022-06-28 – 2022-07-04 (×19): 5 mg via ORAL
  Filled 2022-06-28 (×19): qty 1

## 2022-06-28 NOTE — Progress Notes (Signed)
PROGRESS NOTE   Subjective/Complaints: No events overnight. C/o abdominal discomfort over abdominal bone flap site; tingling/burning pain in LUE, and HA.  Sleep log with 7+ hours overnight however states she is not stayong asleep due to racing thoughts and RUE pain.   Per SLP, family often in the room and causing overstimulation,  ROS:  + R shoulder/neck pain/arm apain - associated with sensory recovery - ongoing + Lethargy/insomnia - improved + Abdominal itching - ongoing Denies fevers, chills, N/V, abdominal pain, diarrhea, SOB, cough, chest pain, new weakness or paraesthesias.    Objective:   No results found. Recent Labs    06/27/22 1447  WBC 6.3  HGB 10.1*  HCT 30.5*  PLT 448*   Recent Labs    06/27/22 1447  NA 140  K 3.8  CL 103  CO2 25  GLUCOSE 87  BUN 13  CREATININE 0.71  CALCIUM 9.8     Intake/Output Summary (Last 24 hours) at 06/28/2022 0839 Last data filed at 06/27/2022 2034 Gross per 24 hour  Intake 417 ml  Output 250 ml  Net 167 ml         Physical Exam: Vital Signs Blood pressure (!) 160/104, pulse 84, temperature 98.3 F (36.8 C), temperature source Oral, resp. rate 17, height 5\' 4"  (1.626 m), weight 53.2 kg, SpO2 99 %. Constitutional: No apparent distress. Appropriate appearance for age. Sitting upright in chair.  HENT: No JVD. Neck Supple. Trachea midline. + R craniotomy site- swollen- much reduced  eyes: PERRLA. EOMI. Visual fields grossly intact.  Cardiovascular: RRR, no murmurs/rub/gallops. No Edema. Peripheral pulses 2+  Respiratory: CTAB. No rales, rhonchi, or wheezing. On RA.  Abdomen: + bowel sounds, normoactive. No distention or tenderness.  Core track removed GU: Not examined.   Skin: R craniectomy and R abdominal surgical sites - healing,  CDI MSK:      No apparent deformity.      Strength:                RUE: 5/5 SA, 5/5 EF, 5/5 EE, 5/5 WE, 5/5 FF, 5/5 FA                  LUE:  1/5 FF,  1/5 EF - improving                RLE: 5/5 HF, 5/5 KE, 5/5 DF, 5/5 EHL, 5/5 PF                 LLE:  1/5 HF, otherwise 0/5  Neurologic exam:  Cognition: AAO to person, place; not time Language: Fluent, No substitutions or neoglisms. No dysarthria.  + Some mild impulsivity , disinhibition - improved today  Insight: Poor insight into current condition.  Mood: Pleasant affect, appropriate mood.  Sensation: Hypersensitive in left upper extremity and left lower extremity to touch; endorses altered from right side - improved sensitivity but remains painful Reflexes: 2+ in R UE and LE; LUE and LLE hyporeflexic. Negative Hoffman's and babinski signs bilaterally.  CN: + L tongue deviation Coordination: No apparent tremors.  Spasticity: MAS 1 left finger flexors, left hip adductors -ongoing Psych: Appropriate mood, mildly labile affect.   Assessment/Plan:  1. Functional deficits which require 3+ hours per day of interdisciplinary therapy in a comprehensive inpatient rehab setting. Physiatrist is providing close team supervision and 24 hour management of active medical problems listed below. Physiatrist and rehab team continue to assess barriers to discharge/monitor patient progress toward functional and medical goals  Care Tool:  Bathing    Body parts bathed by patient: Left arm, Chest, Abdomen, Right upper leg   Body parts bathed by helper: Right arm, Front perineal area, Buttocks, Left upper leg, Right lower leg, Left lower leg, Face     Bathing assist Assist Level: 2 Helpers     Upper Body Dressing/Undressing Upper body dressing   What is the patient wearing?: Pull over shirt    Upper body assist Assist Level: Total Assistance - Patient < 25%    Lower Body Dressing/Undressing Lower body dressing      What is the patient wearing?: Pants     Lower body assist Assist for lower body dressing: Total Assistance - Patient < 25%     Toileting Toileting     Toileting assist Assist for toileting: Total Assistance - Patient < 25%     Transfers Chair/bed transfer  Transfers assist  Chair/bed transfer activity did not occur: Safety/medical concerns  Chair/bed transfer assist level: Maximal Assistance - Patient 25 - 49%     Locomotion Ambulation   Ambulation assist      Assist level: 2 helpers Assistive device: Other (comment) (Hemi-rail) Max distance: 30'   Walk 10 feet activity   Assist     Assist level: 2 helpers     Walk 50 feet activity   Assist Walk 50 feet with 2 turns activity did not occur: Safety/medical concerns (Patient unable to ambulate >30' at this time secondary to fatigue, poor endurance/activity tolerance, impaired attention, increased pain, etc.)         Walk 150 feet activity   Assist Walk 150 feet activity did not occur: Safety/medical concerns         Walk 10 feet on uneven surface  activity   Assist Walk 10 feet on uneven surfaces activity did not occur: Safety/medical concerns         Wheelchair     Assist Is the patient using a wheelchair?: Yes Type of Wheelchair: Manual (per therapist, pt using TIS/Recline back wheelchair)    Wheelchair assist level: Dependent - Patient 0% Max wheelchair distance: 150'    Wheelchair 50 feet with 2 turns activity    Assist        Assist Level: Dependent - Patient 0%   Wheelchair 150 feet activity     Assist      Assist Level: Dependent - Patient 0%   Blood pressure (!) 160/104, pulse 84, temperature 98.3 F (36.8 C), temperature source Oral, resp. rate 17, height 5\' 4"  (1.626 m), weight 53.2 kg, SpO2 99 %.  1. 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 Conchita Paris. Bone flap in abdominal pocket, plan for subacute flap replacement after IPR             -patient may  shower if cover  incisions             -ELOS/Goals: 6/5 goal DC date; supervision to min A   Safety precautions due to poor impulse control - Helmet ordered for OOB 5/8; WHO and [facility] 5/9  Continue CIR   -  5/14: Starting to advance L leg with some buckling, Min A goals seem appropriate. SLP attention, recall improving but family is triggering and she has behaviors when family comes in during therapies.  family meeting - 1 pm on Thursday  2.  Antithrombotics: -DVT/anticoagulation:  Pharmaceutical: Lovenox initiated 07/04/2022             -antiplatelet therapy: N/A  3. Pain Management/neuropathic pain: Oxycodone as needed  5/8: added lidocaine patch x2 to L neck, shoulder  5/9: Add gabapentin 300 mg TID for LUE and LLE pain, likely related to spasticity and neuropathic.   5/10: Mild lethargy today but no use of PRN pain medications; decrease gabapentin to 100 mg TID  5/11: kpad and left shoulder sling ordered  5/23: Using oxycodone Q4H; continue  5/14: Using tylenol and oxycodone together consistently; schedule tylenol 1000 mg TID and oxycodone 5 mg TID; add oxycodone 5 mg BID Prn for breakthrough  -- Increase gabapentin to 200/200/400 mg daily  4. Mood/Behavior/Sleep: PRN twice daily as needed provide emotional support             -antipsychotic agents: N/A  - Sleep log added - poor sleep 3 hours last night. Add melatonin 5 mg QHS + PRN trazodone 50-75 mg.   5/10: Slept 7 hours overnight, still endorsing poor sleep.  Did not use as needed trazodone, will add 50 mg standing nightly.  5/11: continue trazodone  5/13: Per log slept 5 hours overnight, however was not updated 5-10: Will recheck tomorrow  5.14: slept 7 hours overnight; see above for increased QHS gabapentin  5. Neuropsych/cognition: This patient is not capable of making decisions on her own behalf -not consistently oriented, impulsive with poor awareness..   - Obs for safety  6. Skin/Wound Care: Routine skin checks  - 5/8: 3 weeks post-R  craniectomy; ordered staple removal from scalp and abdomen. no apparent wounds will DC rectal tube    7. Dysphagia: placed order for Cortrak removal 5/12 Diet Orders (From admission, onward)     Start     Ordered   06/22/22 0845  DIET DYS 3 Room service appropriate? Yes with Assist; Fluid consistency: Thin; Fluid restriction: 1500 mL Fluid  Diet effective now       Comments: Meds whole with liquid; full supervision with meals  Question Answer Comment  Room service appropriate? Yes with Assist   Fluid consistency: Thin   Fluid restriction: 1500 mL Fluid      06/22/22 0844          5/13: Monitor POs with cortrak removal; doing well!  8.  Hyponatremia.  Continue sodium chloride tablets.  Florinef 0.3 mg 3 times daily  - 5/8: Na 134 this AM. Downtrending over last 2 days. No obvious medication contributors; Already on salt tabs 2g TID. Placed 1500 ml fluid restriction, repeat today 138. Ordered daily BMP x3 days for monitoring. Reduce to Florinef 0.1 mg TID and monitor.   5/9: Na stable 13. Diastolic BP improved, remains high systolic. Orthostatic vitals negative with BP. Given florinef indication is hyponatremia not orthostasis will continue 0.1 mg TID through Friday then DC if no significant drop in Na.   5-10: Na down to 135 with mildly increased lethargy.  Increase Florinef to 0.2 mg 3 times daily, continue daily BMP for 3 days.  5/12: Na improved to 136, decrease Florinef to 0.1mg  TID given hypertension  5/13: NA 136 with decreased Florinef; reduced dosing to twice daily and recheck [facility] Wednesday  5/14:  DC florinef d/t persistent hypertension  10.  Decreased nutritional storage.  Currently on a dysphagia #3 diet.  NGT d/ced   - 5/14: DC SSI, CBG  11.  Urinary retention.  Urecholine 5 mg 3 times daily.  Check PVR - resolved  5/8: No PVRs overnight; ordered. Mixed continence overnight.   5/9: Continue UO and low PVRs. DC urecholine.   5/10: PVRs remain low, continent.  Continue for  24 hours, then DC PVRs.  12.  History of tobacco/alcohol use.  Counseling  13. Diarrhea. Remove rectal tube 5/8. Increase banatrol to TID.  resolved   - 5/9: No recorded Bms since rectal tube removal; reduce banatrol back to BID, obtain KUB  - 5/10: DC banatrol. KUB with mild stool burden.  If no BM today, will need to start laxatives.   5/11: magnesium citrate ordered  5/12: flowsheet reviewd and had type 5 stools today.   5/13: soft stools, last BM 5/12, montior  14. HTN: magnesium citrate ordered, likely elevated due to Florinef, decrease to 1 tab TID    06/28/2022    5:31 AM 06/28/2022    4:21 AM 06/27/2022    8:34 PM  Vitals with BMI  Systolic 160 155 253  Diastolic 104 101 664  Pulse  84 92    - 5/13: PRN hydralazine used 1x overnight; BP remains significantly elevated with decreased Florinef.  Add amlodipine 5 mg daily.  - 5/14: diastolic remains elevated; add lisinopril 5 mg daily. Will take 2-3 days for amlodipine effect. DC florinef  LOS: 7 days A FACE TO FACE EVALUATION WAS PERFORMED  Angelina Sheriff 06/28/2022, 8:39 AM

## 2022-06-28 NOTE — Progress Notes (Signed)
Occupational Therapy Session Note  Patient Details  Name: Kristin Simmons MRN: [id number] Date of Birth: January 07, 1986  Today's Date: 06/28/2022 OT Individual Time: 1300-1400 OT Individual Time Calculation (min): 60 min    Short Term Goals: Week 1:  OT Short Term Goal 1 (Week 1): Pt will sit EOM with MIN-MOD A for 5 min during funcitoanl task in prep for BADL OT Short Term Goal 2 (Week 1): Pt will locate items on L of sink with mod cuing OT Short Term Goal 3 (Week 1): Pt will verbalize 1 step of hemi dressing technique OT Short Term Goal 4 (Week 1): pt will demo improved awareness/decreased impulsivity by waiting for OT to transfer wiht no more than min cuing throughout session  Skilled Therapeutic Interventions/Progress Updates:    Pt greeted seated in wc and agreeable to OT treatment session. Pt declined participating in BADLs or need to go to the bathroom at this time. Pt brought down to therapy gym in wc. Stand-pivot to therapy mat with mod A. Pt brought into sidelying on R side with pillow positioned to avoid pressure placed on incision on skull where was flap was removed. L UE NMR as OT provided joint input through wrist and hand to bring pt through full ROM. Noted some shoulder activation in gravity eliminated position. Noted pt able to flex fingers today and grasp slightly. OT brought pt into sitting and completed cup stacking task with pt able to grasp cups and pull across table. OT assist to stack the cups. OT placed kinesiotape to L shoulder for sublux support. Pt reported need to go to the bathroom. Pt returned to room and Stedy used with min A for transfer to toilet. Pt voided bladder and completed peri-care with set-up A. Pt returned to bed min A w/Stedy. OT placed SAEBO e-stim on wrist extensors. SAEBO left on for 60 minutes. OT returned to remove SAEBO with skin intact and no adverse reactions.  Saebo Stim One 330 pulse width 35 Hz pulse rate On 8 sec/ off 8 sec Ramp up/ down 2  sec Symmetrical Biphasic wave form  Max intensity at 500 Ohm load  Pt left semi-reclined in bed with bed alarm on, call bell in reach, and needs met.    Therapy Documentation Precautions:  Precautions Precautions: Fall Precaution Comments: R craniectomy, no bone flap, bone flap placed in abdomen, R hand mitt, coretrak, flexiseal Restrictions Weight Bearing Restrictions: No Pain:  Denies pain   Therapy/Group: Individual Therapy  Mal Amabile 06/28/2022, 2:03 PM

## 2022-06-28 NOTE — Progress Notes (Signed)
Patient ID: Kristin Simmons, female   DOB: 06/16/1985, 37 y.o.   MRN: [id number]  1233- SW spoke with pt mother [name] to provide updates from team conference, and d/c date 6/5. SW discussed amount of physical assistance pt will require, and scheduling a family meeting. Fam mtg Thursday (5/9) at 1pm. Reports she will see which other family members can be present. Discussed  conference call for those who are not able to be present. SW informed her on CAP/DA referral submitted, and disability referral sent to [facility] as well; understands [facility] referral will be submitted closer towards d/c.   Cecile Sheerer, MSW, LCSWA Office: [phone] Cell: [phone] Fax: [phone]

## 2022-06-28 NOTE — Progress Notes (Signed)
Speech Language Pathology Daily Session Note  Patient Details  Name: Kristin Simmons MRN: [id number] Date of Birth: May 01, 1985  Today's Date: 06/28/2022 SLP Individual Time: 4098-1191 SLP Individual Time Calculation (min): 55 min  Short Term Goals: Week 1: SLP Short Term Goal 1 (Week 1): Patient will consume current diet with minimal overt s/s of aspiration with supervision supervision level verbal cues for use of swallowing compensatory strategies. SLP Short Term Goal 2 (Week 1): Patient will demonstrate sustained attention to functional tasks for 10 minutes with Mod verbal cues for redirection. SLP Short Term Goal 3 (Week 1): Patient will identify 2 physical and 2 cognitive deficits with Max verbal cues. SLP Short Term Goal 4 (Week 1): Patient will attend to left visual field during functional tasks with Mod A multimodal cues. SLP Short Term Goal 5 (Week 1): Patient will demonstrate functional problem solving for basic and familiar tasks with Mod verbal cues.  Skilled Therapeutic Interventions: Skilled treatment session focused on cognitive goals. Upon arrival, patient reported discomfort in bed and also complained of brief being too large. SLP donned a new, smaller brief and repositioned patient in bed to maximize overall attention.  SLP facilitated session by providing extra time and Mod verbal and visual cues for visual scanning to left and functional problem solving during a calendar task in which patient had to transfer appointments onto a calendar. Min verbal cues were needed for attention to task. [name] mother and 2 children present halfway through session with patient demonstrating verbosity and becoming verbally inappropriate. SLP able to redirect. Patient requested to use the bathroom and transferred with +2 assist via the stedy. Max verbal cues needed for safety with task due to impulsivity. Patient also noted with confabulations throughout session. Patient continent of urine.  Patient left upright in TIS wheelchair with alarm on and all needs within reach. Continue with current plan of care.      Pain Pain Assessment Pain Scale: 0-10 Pain Score: 0-No pain  Therapy/Group: Individual Therapy  ,  06/28/2022, 11:06 AM

## 2022-06-28 NOTE — Progress Notes (Signed)
Physical Therapy Weekly Progress Note  Patient Details  Name: Kristin Simmons MRN: [id number] Date of Birth: 30-Sep-1985  Beginning of progress report period: Jun 22, 2022 End of progress report period: Jun 28, 2022  Today's Date: 06/28/2022 PT Individual Time: 1st Treatment Session: 1101-1200; 2nd Treatment Session: 1445-1530 PT Individual Time Calculation (min): 59 min; 45 min  Patient has met 4 of 4 short term goals. Kristin Simmons has made progress toward her LTGs since initial evaluation. Patient currently requires ModA for all functional mobility without the use of an AD. Patient continues to demonstrate functional return to L LE/UE improving her overall mobility.   Patient continues to demonstrate the following deficits muscle weakness, decreased cardiorespiratoy endurance, decreased coordination and decreased motor planning, impaired behavior, decreased midline orientation and decreased attention to left, decreased initiation, decreased attention, decreased awareness, decreased problem solving, decreased safety awareness, decreased memory, and delayed processing, impaired L LE/UE sensation/proprioception, and decreased sitting balance, decreased standing balance, decreased postural control, hemiplegia, and decreased balance strategies and therefore will continue to benefit from skilled PT intervention to increase functional independence with mobility.  Patient progressing toward long term goals..  Continue plan of care. Goals will be upgraded pending patient's progress since her LTGs are currently MinA.   PT Short Term Goals Week 1:  PT Short Term Goal 1 (Week 1): Patient will perform bed mobility with MinA PT Short Term Goal 1 - Progress (Week 1): Met PT Short Term Goal 2 (Week 1): Patient will perform bed/chair transfer with ModA x1 PT Short Term Goal 2 - Progress (Week 1): Met PT Short Term Goal 3 (Week 1): Patient will gait train x50' with LRAD and MaxA x1 PT Short Term Goal 3 -  Progress (Week 1): Met PT Short Term Goal 4 (Week 1): Patient will be able to tolerate sitting upright in TIS wheelchair between treatment sessions PT Short Term Goal 4 - Progress (Week 1): Met Week 2:  PT Short Term Goal 1 (Week 2): Patient will complete bed/chair transfer with MinA PT Short Term Goal 2 (Week 2): Patient will ambulate x50' with LRAD and ModA x1 PT Short Term Goal 3 (Week 2): Patient will initiate stair training  Skilled Therapeutic Interventions/Progress Updates:  1st Treatment Session- Patient greeted sitting in TIS wheelchair in room and agreeable to PT treatment session. Physician present at start of treatment session in order to perform morning rounds. Therapist donned socks/shoes, helmet and LUE sling for time management and then patient wheeled to day room.   Patient stood with R UE support while performing R foot taps to 4" block and Min/ModA for L LE management- Mirror placed in front of patient for external visual cues regarding posturing. Patient performed x10, 2 x 5 total with seated rest break in between. Multimodal cues for improved postural extension throughout and to self-correct hip rotation/flexion with good effort noted, however unable to sustain throughout activity. Therapist seated on a stool providing stability at L knee and approximation at L hip for improved stability and posturing. VC for forward gaze and improved postural extension.   Patient gait trained 3 x 30' at R HR with Min/ModA and rehab tech present providing a wheelchair follow for safety. Therapist seated on a stool in order to approximate L hip and block L knee during stance phase of gait for improved stability and prevent buckling. Therapist also providing MinA for advancing and improved placement of L LE throughout swing phase of gait. Patient able to initiate swing phase of  gait, however fatigued quickly.   Patient returned to her room and left reclined in TIS wheelchair with seat belt on, posey  belt on, call bell within reach and all needs met.    2nd Treatment Session- Patient greeted supine in bed asleep, but arousable and eventually agreeable to PT treatment session. Therapist assisted with transitioning patient from supine to sitting EOB with ModA for righting trunk secondary to lethargy. Upon sitting EOB, patient refused OOB mobility and lied herself back down with SBA. Patient agreeable to performing supine therex in order to increase L LE strength and improved functional mobility- -L heel slide AAROM, 3 x 10 -L quad sets, x10  -L SAQ with AAROM, 2 x 10  -L SAQ with AAROM and Saebo, x15 minutes -L dorsiflexion with AAROM and Saebo, x5 minutes  Saebo Stim One 330 pulse width 35 Hz pulse rate On 8 sec/ off 8 sec Ramp up/ down 2 sec Symmetrical Biphasic wave form  Max intensity at 500 Ohm load  Patient scooted toward [facility] with B LE and therapist stabilizing L LE throughout. RN administered pain medication upon sitting upright in bed. Patient left semi-reclined with bed alarm on, L UE propped on a pillow, call bell within reach and all needs met.    Therapy Documentation Precautions:  Precautions Precautions: Fall Precaution Comments: R craniectomy, no bone flap, bone flap placed in abdomen, R hand mitt, coretrak, flexiseal Restrictions Weight Bearing Restrictions: No  Pain: Reported pain in neck with heating pad applied and RN administered pain medication.    Therapy/Group: Individual Therapy     06/28/2022, 7:51 AM

## 2022-06-28 NOTE — Patient Care Conference (Signed)
Inpatient RehabilitationTeam Conference and Plan of Care Update Date: 06/28/2022   Time: 10:32 AM   Patient Name: Kristin Simmons      Medical Record Number: [id number]  Date of Birth: 12-30-1985 Sex: Female         Room/Bed: 4W08C/4W08C-01 Payor Info: Payor: MEDICAID [place] / Plan: MEDICAID [place] ACCESS / Product Type: *No Product type* /    Admit Date/Time:  06/21/2022  5:27 PM  Primary Diagnosis:  SAH (subarachnoid hemorrhage) [facility])  Hospital Problems: Principal Problem:   SAH (subarachnoid hemorrhage) (HCC) Active Problems:   Right internal carotid artery aneurysm    Expected Discharge Date: Expected Discharge Date: 07/20/22  Team Members Present: Physician leading conference: Dr. Elijah Birk Social Worker Present: Cecile Sheerer, LCSWA Nurse Present: Vedia Pereyra, RN PT Present: Amedeo Plenty, PT OT Present: Kearney Hard, OT SLP Present: Feliberto Gottron, SLP PPS Coordinator present : Fae Pippin, SLP     Current Status/Progress Goal Weekly Team Focus  Bowel/Bladder   continent of b/b; LBM: 5/13   remain continent   assist with toileting needs prn    Swallow/Nutrition/ Hydration   Dys. 3 textures with thin liquids, Min A for use of swallowing strategies   Supervision  use of swallowing strategies    ADL's   Mod A stand-pivot transfers, max A overall for BADLs   Min A overall   self-care retraining, activity tolernace, balance, L NMR, L NMES (SAEBO)    Mobility   Min/ModA for bed mobility; ModA for stand pivot transfers; MinA for sit/stands; ModA for gait up to 30' with R HR and wheelchair follow.   CGA/MinA  Sitting balance, standing balance, gait, sit/stands, transfers, L NMR, endurance/activity tolerance, attention, awareness, pain, education, discharge planning, etc.    Communication                Safety/Cognition/ Behavioral Observations  Mod A   Supervision   sustained attention, emergent awareness, functional problem solving,  scanning/attention to left    Pain   c/o R head/face pain;PRN tylenol and oxy   pain level <6/10   assess pain QS and prn    Skin   R crani incision OTA; RLQ incision skull flap   remain free of new skin breakdown/infection  assess skin QS and prn      Discharge Planning:  Pt will discharge to home with support from her family, and children. Pt mother likley to help in the mornings due to working third shift. CAP/DA referral submitted, and disability referral submitted to [facility]. PCS referral will be submitted closer towards discharge.   Team Discussion: SAH. Tele-sitter.  Watching hyponatremia. Cortrak removed. PO intake improving. D3/Thin/ 1500 fluid restriction. Sleep chart for sleep/wake cycles. Blood sugars good. LUE- Flaccid. Mood/behaviors with family and children.  Patient on target to meet rehab goals: yes  *See Care Plan and progress notes for long and short-term goals.   Revisions to Treatment Plan:  Adjusting pain medications for better pain control. Titration or Florinef.  Stopped blood glucose monitoring. Family meeting 06/30/22 @ 1300. Neuro-psych.  Teaching Needs: Medications, safety, self care, gait/transfer training, skin/wound care, etc.   Current Barriers to Discharge: Decreased caregiver support, Wound care, and Behavior  Possible Resolutions to Barriers: Family education, monitor incision site for S/S of infection, family meeting, order recommended DME if needed.      Medical Summary Current Status: medically complicated by pain, insomnia, hypertension, steroid titration for hyponatremia, inadequite nutrition and cosntipation  Barriers to Discharge: Behavior/Mood;Electrolyte abnormality;Inadequate  Nutritional Intake;Medical stability;Self-care education;Uncontrolled Hypertension;Uncontrolled Pain  Barriers to Discharge Comments: Hypertension d/t steroid use, pain, insomnia and behavioral/insight deficit Possible Resolutions to [employer]: Titrating up paih medications while weaning florinef, titrating pain medication, promoting sleep-wake cycle   Continued Need for Acute Rehabilitation Level of Care: The patient requires daily medical management by a physician with specialized training in physical medicine and rehabilitation for the following reasons: Direction of a multidisciplinary physical rehabilitation program to maximize functional independence : Yes Medical management of patient stability for increased activity during participation in an intensive rehabilitation regime.: Yes Analysis of laboratory values and/or radiology reports with any subsequent need for medication adjustment and/or medical intervention. : Yes   I attest that I was present, lead the team conference, and concur with the assessment and plan of the team.   Jearld Adjutant 06/28/2022, 2:40 PM

## 2022-06-29 ENCOUNTER — Inpatient Hospital Stay (HOSPITAL_COMMUNITY): Payer: Medicaid Other

## 2022-06-29 LAB — BASIC METABOLIC PANEL
Anion gap: 10 (ref 5–15)
BUN: 12 mg/dL (ref 6–20)
CO2: 25 mmol/L (ref 22–32)
Calcium: 9.4 mg/dL (ref 8.9–10.3)
Chloride: 103 mmol/L (ref 98–111)
Creatinine, Ser: 0.79 mg/dL (ref 0.44–1.00)
GFR, Estimated: >60 mL/min (ref >60)
Glucose, Bld: 101 mg/dL — ABNORMAL HIGH (ref 70–99)
Potassium: 3.7 mmol/L (ref 3.5–5.1)
Sodium: 138 mmol/L (ref 135–145)

## 2022-06-29 NOTE — Progress Notes (Signed)
Speech Language Pathology Weekly Progress and Session Note  Patient Details  Name: Kristin Simmons MRN: [id number] Date of Birth: October 18, 1985  Beginning of progress report period: April 22, 2022 End of progress report period: April 29, 2022  Today's Date: 06/29/2022 SLP Individual Time: 6578-4696 SLP Individual Time Calculation (min): 42 min  Short Term Goals: Week 1: SLP Short Term Goal 1 (Week 1): Patient will consume current diet with minimal overt s/s of aspiration with supervision supervision level verbal cues for use of swallowing compensatory strategies. SLP Short Term Goal 1 - Progress (Week 1): Not met SLP Short Term Goal 2 (Week 1): Patient will demonstrate sustained attention to functional tasks for 10 minutes with Mod verbal cues for redirection. SLP Short Term Goal 2 - Progress (Week 1): Met SLP Short Term Goal 3 (Week 1): Patient will identify 2 physical and 2 cognitive deficits with Max verbal cues. SLP Short Term Goal 3 - Progress (Week 1): Met SLP Short Term Goal 4 (Week 1): Patient will attend to left visual field during functional tasks with Mod A multimodal cues. SLP Short Term Goal 4 - Progress (Week 1): Met SLP Short Term Goal 5 (Week 1): Patient will demonstrate functional problem solving for basic and familiar tasks with Mod verbal cues. SLP Short Term Goal 5 - Progress (Week 1): Met    New Short Term Goals: Week 2: SLP Short Term Goal 1 (Week 2): Patient will consume current diet with minimal overt s/s of aspiration with supervision supervision level verbal cues for use of swallowing compensatory strategies. SLP Short Term Goal 2 (Week 2): Patient will demonstrate sustained attention to functional tasks for 30 minutes with Mod verbal cues for redirection. SLP Short Term Goal 3 (Week 2): Patient will attend to left visual field during functional tasks with Min A multimodal cues. SLP Short Term Goal 4 (Week 2): Patient will demonstrate functional problem solving for  mildly complex tasks with Min verbal cues. SLP Short Term Goal 5 (Week 2): Patient will self-monitor and correct errors during functional tasks with Min verbal and visual cues. SLP Short Term Goal 6 (Week 2): Patient will recall new, daily information with Mod multimodal cues.  Weekly Progress Updates: Patient has made functional gains and has met 4 of 5 STGs this reporting period. Currently, patient is consuming Dys. 3 textures with thin liquids with minimal overt s/s of aspiration but requires Min verbal cues for use of swallowing compensatory strategies. Patient demonstrates improved cognitive functioning and requires overall Mod A multimodal cues to complete functional and mildly complex tasks safely in regards to problem solving, sustained attention, and emergent awareness. Overall Mod-Max A multimodal cues are needed for functional recall as patient continues to demonstrate intermittent confabulations. Patient and family education ongoing. Patient would benefit from continued skilled SLP intervention to maximize her swallowing and cognitive functioning piror to discharge.      Intensity: Minumum of 1-2 x/day, 30 to 90 minutes Frequency: 1 to 3 out of 7 days Duration/Length of Stay: 07/20/22 Treatment/Interventions: Cognitive remediation/compensation;Cueing hierarchy;Environmental controls;Functional tasks;Internal/external aids;Patient/family education;Therapeutic Activities;Dysphagia/aspiration precaution training   Daily Session  Skilled Therapeutic Interventions:   Skilled treatment session focused on dysphagia and cognitive goals. Upon arrival, patient was awake while upright in the wheelchair. Patient reporting fatigue due to poor sleep secondary to a confabulatory story that patient began to tell. SLP provided education regarding confusion/confabulations vs reality. Patient verbalized understanding but will need reinforcement. Patient consumed her breakfast meal of Dys. 3 textures with thin  liquids without overt s/s of aspiration but required Min-Mod verbal cues for use of swallowing compensatory strategies. Recommend patient continue current diet. Patient independently oriented to place and situation but required Min verbal cues for use of the calendar and orientation to time. Patient with decreased sustained attention today compared to previous sessions, suspect due to ongoing pain behind right eye. Nursing and physician aware. [name] mother present throughout session and educated regarding progress and strategies to help minimize confabulations. Patient left upright in wheelchair with alarm on and all needs within reach. Continue with current plan of care.    Pain Pain in right eye Physician and Nursing aware   Therapy/Group: Individual Therapy  ,  06/29/2022, 6:27 AM

## 2022-06-29 NOTE — Progress Notes (Signed)
Occupational Therapy Weekly Progress Note  Patient Details  Name: Kristin Simmons MRN: [id number] Date of Birth: Mar 06, 1985  Beginning of progress report period: Jun 22, 2022 End of progress report period: Jun 29, 2022  Today's Date: 06/29/2022 OT Individual Time: 4098-1191 OT Individual Time Calculation (min): 57 min    Patient has met 4 of 4 short term goals.  Patient is making steady progress towards OT goals. Pt is at a min-mod A for stand-pivot transfers, BADL task have progressed to an overall mod A. Pt has made progress with her L UE today and can activate some finger flexion and trace shoulder activation. Continue current POC.   Patient continues to demonstrate the following deficits: muscle weakness, impaired timing and sequencing, abnormal tone, unbalanced muscle activation, motor apraxia, ataxia, decreased coordination, and decreased motor planning, decreased midline orientation, decreased attention to left, left side neglect, and decreased motor planning, decreased initiation, decreased attention, decreased awareness, decreased problem solving, decreased safety awareness, decreased memory, and delayed processing, and decreased sitting balance, decreased standing balance, decreased postural control, hemiplegia, and decreased balance strategies and therefore will continue to benefit from skilled OT intervention to enhance overall performance with BADL, Reduce care partner burden, and functional use of L UE .  Patient progressing toward long term goals..  Continue plan of care.  OT Short Term Goals Week 1:  OT Short Term Goal 1 (Week 1): Pt will sit EOM with MIN-MOD A for 5 min during funcitoanl task in prep for BADL OT Short Term Goal 1 - Progress (Week 1): Met OT Short Term Goal 2 (Week 1): Pt will locate items on L of sink with mod cuing OT Short Term Goal 2 - Progress (Week 1): Met OT Short Term Goal 3 (Week 1): Pt will verbalize 1 step of hemi dressing technique OT Short Term  Goal 3 - Progress (Week 1): Met OT Short Term Goal 4 (Week 1): pt will demo improved awareness/decreased impulsivity by waiting for OT to transfer wiht no more than min cuing throughout session OT Short Term Goal 4 - Progress (Week 1): Met Week 2:  OT Short Term Goal 1 (Week 2): Patient will demonstrate improved awarenress of L hemi body by positioning L UE prior to transfer OT Short Term Goal 2 (Week 2): Patient will complete 1 step of LB dressing task OT Short Term Goal 3 (Week 2): Patient will dress L UE first with mod A  Skilled Therapeutic Interventions/Progress Updates:    Patient greeted semi-reclined in bed asleep. Pt needed max cues to wake and agreeable to OT. Mod A to sit up and mod A to stand-pivot to TIS wc. Nursing administered meds with pt needing applesauce to get meds down. Pt agreeable to shower today. Stedy used for shower transfer with min A for sit<>stands in [place]. Bathing completed form roll in shower chair for postural stability. Hand over hand A to integrate L UE into bathing tasks for neuro re-ed. Stedy transfer out of shower to TIS wc. Blocked practice for hemi dressing strategies with pt able to verbalize to dress L side first but difficulty executing. Sit<>stands at the sink with min A, but mod A for dynamic balance when reaching outside base of support. OT re-applied kinesiotape for L shoulder support. OT placed SAEBO e-stim on wrist extensors.Marland Kitchen SAEBO left on for 60 minutes. OT returned to remove SAEBO with skin intact and no adverse reactions.  [place] Stim One 330 pulse width 35 Hz pulse rate On 8 sec/ off  8 sec Ramp up/ down 2 sec Symmetrical Biphasic wave form  Max intensity at 500 Ohm load  Pt left seated in TIS wc with alarm belt on, call bell in reach, and needs met.   Therapy Documentation Precautions:  Precautions Precautions: Fall Precaution Comments: R craniectomy, no bone flap, bone flap placed in abdomen, R hand mitt, coretrak,  flexiseal Restrictions Weight Bearing Restrictions: No Pain: Pain Assessment Pain Scale: 0-10 Pain Score: 0-No pain   Therapy/Group: Individual Therapy  Mal Amabile 06/29/2022, 12:54 PM

## 2022-06-29 NOTE — Progress Notes (Signed)
Physical Therapy Session Note  Patient Details  Name: Kristin Simmons MRN: [id number] Date of Birth: 06-14-1985  Today's Date: 06/29/2022 PT Individual Time: 1100-1200 PT Individual Time Calculation (min): 60 min   Short Term Goals: Week 2:  PT Short Term Goal 1 (Week 2): Patient will complete bed/chair transfer with MinA PT Short Term Goal 2 (Week 2): Patient will ambulate x50' with LRAD and ModA x1 PT Short Term Goal 3 (Week 2): Patient will initiate stair training  Skilled Therapeutic Interventions/Progress Updates:  Patient greeted supine in bed and agreeable to PT treatment session. Patient transitioned from supine to sitting EOB with CGA/MinA for righting trunk with VC throughout for improved hand placement and using bed rail to assist with righting trunk. While sitting EOB, patient initially required MinA for sitting balance secondary to L lateral lean however with increased time patient was able sit with SBA and VC for improved midline posturing. While seated, therapist donned socks and tennis shoes (father brought in a larger sized shoe in order for therapist to trial an AFO). Patient stood from EOB without the use of an AD and Min/ModA- Patient then performed stand pivot transfer to TIS wheelchair with ModA and therapist blocking L LE throughout stance phase. Patient wheeled to hallway outside of main gym for gait training.   Patient gait trained 5 x 30' with R HR and MinA with rehab tech providing a wheelchair follow for safety- Initial gait trial was performed with L LE ace wrapped into dorsiflexion and eversion to improved swing phase of gait. During the final four gait trials, patient performed with L anterior AFO with L knee ace wrapped for increased proprioceptive input. Therapist facilitating hips throughout gait trials for improved R lateral weight shift for improved L foot clearance and therapist still providing TC for L TKE in order to prevent buckling. By the final gait trial,  patient was able to advance her L LE independently (with hip facilitation from therapist) and place it with therapist blocking during stance phase due to continued buckling even with brace. Mirror in front of patient for external visual cues regarding posturing.   Patient returned to her room and left reclined in TIS wheelchair with seat belt and posey belt on, call bell within reach and all needs met.    Therapy Documentation Precautions:  Precautions Precautions: Fall Precaution Comments: R craniectomy, no bone flap, bone flap placed in abdomen, R hand mitt, coretrak, flexiseal Restrictions Weight Bearing Restrictions: No  Pain: L shoulder pain- Positioning improved pain.     Therapy/Group: Individual Therapy     06/29/2022, 12:40 PM

## 2022-06-29 NOTE — Progress Notes (Signed)
Occupational Therapy Session Note  Patient Details  Name: Kristin Simmons MRN: [id number] Date of Birth: 1985-08-28  Today's Date: 06/29/2022 OT Individual Time: 1300-1345 OT Individual Time Calculation (min): 45 min    Short Term Goals: Week 2:  OT Short Term Goal 1 (Week 2): Patient will demonstrate improved awarenress of L hemi body by positioning L UE prior to transfer OT Short Term Goal 2 (Week 2): Patient will complete 1 step of LB dressing task OT Short Term Goal 3 (Week 2): Patient will dress L UE first with mod A  Skilled Therapeutic Interventions/Progress Updates:    Pt greeted sideways in bed, awake and agreeable to OT treatment sesssion. Pt needed mod A for bed mobility and min up to mod A for dynamic sitting balance while OT assisted with donning shoes and L AFO. Mod A stand-pivot to wc. Pt brought to therapy gym in wc for time management. L UE NMR with weight bearing towel pushes on high-lowe table with focus on high repetitions. OT placed SAEBO e-stim on upper deltoid and supraspinatus for pain management and sublux support. Addressed grasp and release with pt able to grasp, but difficulty releasing large cups. Pt requested to return to bed at end of session. Stand-pivot to the L with mod A. OT placed heating pad under L shoulder for pain management and L UE supported. Bed alarm on, call bell in reach and needs met.   Therapy Documentation Precautions:  Precautions Precautions: Fall Precaution Comments: R craniectomy, no bone flap, bone flap placed in abdomen, R hand mitt, coretrak, flexiseal Restrictions Weight Bearing Restrictions: No Pain Patient reported L shoulder pain. Rest, repositioned, heat applied.   Therapy/Group: Individual Therapy  Mal Amabile 06/29/2022, 1:52 PM

## 2022-06-29 NOTE — Progress Notes (Signed)
PROGRESS NOTE   Subjective/Complaints: No events overnight.  C/o worsening pressure behind R eye, especially with EOMI, and light intolterance.   ROS:  + R eye pressure/pain - worsening + R shoulder/neck pain/arm pain - associated with sensory recovery - ongoing + Lethargy/insomnia - improved + Abdominal itching - improved  Denies fevers, chills, N/V, abdominal pain, diarrhea, SOB, cough, chest pain, new weakness or paraesthesias.    Objective:   No results found. Recent Labs    06/27/22 1447  WBC 6.3  HGB 10.1*  HCT 30.5*  PLT 448*    Recent Labs    06/27/22 1447  NA 140  K 3.8  CL 103  CO2 25  GLUCOSE 87  BUN 13  CREATININE 0.71  CALCIUM 9.8     Intake/Output Summary (Last 24 hours) at 06/29/2022 1610 Last data filed at 06/28/2022 2128 Gross per 24 hour  Intake 120 ml  Output 300 ml  Net -180 ml         Physical Exam: Vital Signs Blood pressure (!) 123/97, pulse 73, temperature 98 F (36.7 C), temperature source Oral, resp. rate 16, height 5\' 4"  (1.626 m), weight 53.2 kg, SpO2 100 %. Constitutional: No apparent distress. Appropriate appearance for age. Sitting upright in chair.  HENT: No JVD. Neck Supple. Trachea midline. + R craniotomy site- swollen-improving daily, reducing appropriately eyes: Pupils constricted but equal and reactive bilaterally.  EOMI grossly intact without any obvious nystagmus, however patient has very poor tolerance for right lateral or superior gaze.  Keeping right eye covered.  Cardiovascular: RRR, no murmurs/rub/gallops. No Edema. Peripheral pulses 2+  Respiratory: CTAB. No rales, rhonchi, or wheezing. On RA.  Abdomen: + bowel sounds, normoactive. No distention or tenderness.  Core track removed GU: Not examined.   Skin: R craniectomy and R abdominal surgical sites - healing,  CDI.  No tenderness, fluctuance, or warmth on palpation of craniectomy site. + Bilateral  periorbital hematoma, appears to be resolving, no appreciable edema MSK:      No apparent deformity.      Strength:                RUE: 5/5 SA, 5/5 EF, 5/5 EE, 5/5 WE, 5/5 FF, 5/5 FA                 LUE:  1/5 FF,  1/5 EF - improving                RLE: 5/5 HF, 5/5 KE, 5/5 DF, 5/5 EHL, 5/5 PF                 LLE:  1/5 HF, otherwise 0/5  Neurologic exam:  Cognition: AAO to person, place, and time Language: Fluent, No substitutions or neoglisms. No dysarthria.  + Some mild impulsivity , disinhibition   Insight: Poor insight into current condition.  Mood: Pleasant affect, appropriate mood.  Sensation: Hypersensitive in left upper extremity and left lower extremity to touch  Reflexes: 2+ in R UE and LE; LUE and LLE hyporeflexic. Negative Hoffman's and babinski signs bilaterally.  CN: + L tongue deviation Coordination: No apparent tremors.  Spasticity: MAS 1 left finger flexors, left hip  adductors -ongoing   Assessment/Plan: 1. Functional deficits which require 3+ hours per day of interdisciplinary therapy in a comprehensive inpatient rehab setting. Physiatrist is providing close team supervision and 24 hour management of active medical problems listed below. Physiatrist and rehab team continue to assess barriers to discharge/monitor patient progress toward functional and medical goals  Care Tool:  Bathing    Body parts bathed by patient: Left arm, Chest, Abdomen, Right upper leg   Body parts bathed by helper: Right arm, Front perineal area, Buttocks, Left upper leg, Right lower leg, Left lower leg, Face     Bathing assist Assist Level: 2 Helpers     Upper Body Dressing/Undressing Upper body dressing   What is the patient wearing?: Pull over shirt    Upper body assist Assist Level: Total Assistance - Patient < 25%    Lower Body Dressing/Undressing Lower body dressing      What is the patient wearing?: Pants     Lower body assist Assist for lower body dressing: Total  Assistance - Patient < 25%     Toileting Toileting    Toileting assist Assist for toileting: Total Assistance - Patient < 25%     Transfers Chair/bed transfer  Transfers assist  Chair/bed transfer activity did not occur: Safety/medical concerns  Chair/bed transfer assist level: Maximal Assistance - Patient 25 - 49%     Locomotion Ambulation   Ambulation assist      Assist level: 2 helpers Assistive device: Other (comment) (Hemi-rail) Max distance: 30'   Walk 10 feet activity   Assist     Assist level: 2 helpers     Walk 50 feet activity   Assist Walk 50 feet with 2 turns activity did not occur: Safety/medical concerns (Patient unable to ambulate >30' at this time secondary to fatigue, poor endurance/activity tolerance, impaired attention, increased pain, etc.)         Walk 150 feet activity   Assist Walk 150 feet activity did not occur: Safety/medical concerns         Walk 10 feet on uneven surface  activity   Assist Walk 10 feet on uneven surfaces activity did not occur: Safety/medical concerns         Wheelchair     Assist Is the patient using a wheelchair?: Yes Type of Wheelchair: Manual (per therapist, pt using TIS/Recline back wheelchair)    Wheelchair assist level: Dependent - Patient 0% Max wheelchair distance: 150'    Wheelchair 50 feet with 2 turns activity    Assist        Assist Level: Dependent - Patient 0%   Wheelchair 150 feet activity     Assist      Assist Level: Dependent - Patient 0%   Blood pressure (!) 123/97, pulse 73, temperature 98 F (36.7 C), temperature source Oral, resp. rate 16, height 5\' 4"  (1.626 m), weight 53.2 kg, SpO2 100 %.  1. 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 Conchita Paris. Bone flap in abdominal pocket, plan for subacute flap replacement after  IPR             -patient may  shower if cover incisions             -ELOS/Goals: 6/5 goal DC date; supervision to min A   Safety precautions due to poor impulse control - Helmet ordered for OOB 5/8; WHO and [facility] 5/9  Continue CIR   - 5/14: Starting to advance L leg with some buckling, Min A goals seem appropriate. SLP attention, recall improving but family is triggering and she has behaviors when family comes in during therapies.  family meeting - 1 pm on Thursday  2.  Antithrombotics: -DVT/anticoagulation:  Pharmaceutical: Lovenox initiated 07/04/2022             -antiplatelet therapy: N/A  3. Pain Management/neuropathic pain: Oxycodone as needed  5/8: added lidocaine patch x2 to L neck, shoulder  5/9: Add gabapentin 300 mg TID for LUE and LLE pain, likely related to spasticity and neuropathic.   5/10: Mild lethargy today but no use of PRN pain medications; decrease gabapentin to 100 mg TID  5/11: kpad and left shoulder sling ordered  5/23: Using oxycodone Q4H; continue  5/14: Using tylenol and oxycodone together consistently; schedule tylenol 1000 mg TID and oxycodone 5 mg TID; add oxycodone 5 mg BID Prn for breakthrough  -- Increase gabapentin to 200/200/400 mg daily  4. Mood/Behavior/Sleep: PRN twice daily as needed provide emotional support             -antipsychotic agents: N/A  - Sleep log added - poor sleep 3 hours last night. Add melatonin 5 mg QHS + PRN trazodone 50-75 mg.   5/10: Slept 7 hours overnight, still endorsing poor sleep.  Did not use as needed trazodone, will add 50 mg standing nightly.  5/11: continue trazodone  5/13: Per log slept 5 hours overnight, however was not updated 5-10: Will recheck tomorrow  5.14: slept 7 hours overnight; see above for increased QHS gabapentin  5/15: s sleep log not completed overnight, informed nursing.  Patient endorses ongoing poor sleep, no improvement with increase gabapentin.  5. Neuropsych/cognition: This patient is not capable of  making decisions on her own behalf -not consistently oriented, impulsive with poor awareness..   - Obs for safety  6. Skin/Wound Care: Routine skin checks  - 5/8: 3 weeks post-R craniectomy; ordered staple removal from scalp and abdomen. no apparent wounds will DC rectal tube    7. Dysphagia: placed order for Cortrak removal 5/12 Diet Orders (From admission, onward)     Start     Ordered   06/22/22 0845  DIET DYS 3 Room service appropriate? Yes with Assist; Fluid consistency: Thin; Fluid restriction: 1500 mL Fluid  Diet effective now       Comments: Meds whole with liquid; full supervision with meals  Question Answer Comment  Room service appropriate? Yes with Assist   Fluid consistency: Thin   Fluid restriction: 1500 mL Fluid      06/22/22 0844          5/13: Monitor POs with cortrak removal; doing well!  8.  Hyponatremia.  Continue sodium chloride tablets.  Florinef 0.3 mg 3 times daily  - 5/8: Na 134 this AM. Downtrending over last 2 days. No obvious medication contributors; Already on salt tabs 2g TID. Placed 1500 ml fluid restriction, repeat today 138. Ordered daily BMP x3 days for monitoring. Reduce to Florinef 0.1 mg TID and monitor.   5/9: Na stable 13. Diastolic BP improved, remains high systolic. Orthostatic vitals negative with BP. Given florinef indication is hyponatremia not orthostasis will continue 0.1 mg TID through Friday then DC if no significant drop in Na.   5-10: Na down to 135 with mildly increased lethargy.  Increase Florinef to 0.2 mg 3 times daily, continue daily BMP for 3 days.  5/12: Na improved to  136, decrease Florinef to 0.1mg  TID given hypertension  5/13: NA 136 with decreased Florinef; reduced dosing to twice daily and recheck BMP Wednesday  5/14: DC florinef d/t persistent hypertension; labs pending  10.  Decreased nutritional storage.  Currently on a dysphagia #3 diet.  NGT d/ced   - 5/14: DC SSI, CBG  11.  Urinary retention.  Urecholine 5 mg 3  times daily.  Check PVR - resolved  5/8: No PVRs overnight; ordered. Mixed continence overnight.   5/9: Continue UO and low PVRs. DC urecholine.   5/10: PVRs remain low, continent.  Continue for 24 hours, then DC PVRs.  12.  History of tobacco/alcohol use.  Counseling  13. Diarrhea. Remove rectal tube 5/8. Increase banatrol to TID.     - 5/9: No recorded Bms since rectal tube removal; reduce banatrol back to BID, obtain KUB  - 5/10: DC banatrol. KUB with mild stool burden.  If no BM today, will need to start laxatives.   5/11: magnesium citrate ordered  5/12: flowsheet reviewd and had type 5 stools today.   5/13: soft stools, last BM 5/12, montior  5/15: Liquid Bms 5/13; now on PO diet, monitor  14. HTN: magnesium citrate ordered, likely elevated due to Florinef, decrease to 1 tab TID    06/29/2022    5:37 AM 06/28/2022    7:28 PM 06/28/2022    2:55 PM  Vitals with BMI  Systolic 123 137 161  Diastolic 97 96 99  Pulse 73 84 87    - 5/13: PRN hydralazine used 1x overnight; BP remains significantly elevated with decreased Florinef.  Add amlodipine 5 mg daily.  - 5/14: diastolic remains elevated; add lisinopril 5 mg daily. Will take 2-3 days for amlodipine effect. DC florinef - 5/15: Much improved;  DC lisinopril; Dc amlodipine this PM if remains normotensive  15. L eye pain/pressure: Getting MRI brain and orbits w/o contrast d/t worsening symptoms and poor EOMI tolerance; should have been improving with decreased postop edema  LOS: 8 days A FACE TO FACE EVALUATION WAS PERFORMED  Angelina Sheriff 06/29/2022, 8:12 AM

## 2022-06-30 LAB — BASIC METABOLIC PANEL
Anion gap: 11 (ref 5–15)
BUN: 9 mg/dL (ref 6–20)
CO2: 24 mmol/L (ref 22–32)
Calcium: 9.8 mg/dL (ref 8.9–10.3)
Chloride: 102 mmol/L (ref 98–111)
Creatinine, Ser: 0.73 mg/dL (ref 0.44–1.00)
GFR, Estimated: >60 mL/min (ref >60)
Glucose, Bld: 99 mg/dL (ref 70–99)
Potassium: 4.2 mmol/L (ref 3.5–5.1)
Sodium: 137 mmol/L (ref 135–145)

## 2022-06-30 LAB — CBC WITH DIFFERENTIAL/PLATELET
Abs Immature Granulocytes: 0 10*3/uL (ref 0.00–0.07)
Basophils Absolute: 0.1 10*3/uL (ref 0.0–0.1)
Basophils Relative: 1 %
Eosinophils Absolute: 0.2 10*3/uL (ref 0.0–0.5)
Eosinophils Relative: 3 %
HCT: 35 % — ABNORMAL LOW (ref 36.0–46.0)
Hemoglobin: 11.4 g/dL — ABNORMAL LOW (ref 12.0–15.0)
Immature Granulocytes: 0 %
Lymphocytes Relative: 38 %
Lymphs Abs: 2 10*3/uL (ref 0.7–4.0)
MCH: 30.2 pg (ref 26.0–34.0)
MCHC: 32.6 g/dL (ref 30.0–36.0)
MCV: 92.8 fL (ref 80.0–100.0)
Monocytes Absolute: 0.4 10*3/uL (ref 0.1–1.0)
Monocytes Relative: 7 %
Neutro Abs: 2.7 10*3/uL (ref 1.7–7.7)
Neutrophils Relative %: 51 %
Platelets: 505 10*3/uL — ABNORMAL HIGH (ref 150–400)
RBC: 3.77 MIL/uL — ABNORMAL LOW (ref 3.87–5.11)
RDW: 14.5 % (ref 11.5–15.5)
WBC: 5.3 10*3/uL (ref 4.0–10.5)
nRBC: 0 % (ref 0.0–0.2)

## 2022-06-30 MED ORDER — POLYETHYLENE GLYCOL 3350 17 G PO PACK
17.0000 g | PACK | Freq: Every day | ORAL | Status: DC | PRN
  Filled 2022-06-30: qty 1

## 2022-06-30 MED ORDER — DOCUSATE SODIUM 100 MG PO CAPS
100.0000 mg | ORAL_CAPSULE | Freq: Two times a day (BID) | ORAL | Status: DC
  Administered 2022-06-30 – 2022-07-06 (×13): 100 mg via ORAL
  Filled 2022-06-30 (×13): qty 1

## 2022-06-30 MED ORDER — TRAZODONE HCL 50 MG PO TABS
75.0000 mg | ORAL_TABLET | Freq: Every day | ORAL | Status: DC
  Administered 2022-06-30 – 2022-07-06 (×7): 75 mg via ORAL
  Filled 2022-06-30 (×7): qty 2

## 2022-06-30 MED ORDER — AMLODIPINE BESYLATE 10 MG PO TABS
10.0000 mg | ORAL_TABLET | Freq: Every day | ORAL | Status: DC
  Administered 2022-07-01 – 2022-07-20 (×20): 10 mg via ORAL
  Filled 2022-06-30 (×20): qty 1

## 2022-06-30 MED ORDER — GABAPENTIN 300 MG PO CAPS
600.0000 mg | ORAL_CAPSULE | Freq: Every day | ORAL | Status: DC
  Administered 2022-06-30 – 2022-07-04 (×5): 600 mg via ORAL
  Filled 2022-06-30 (×5): qty 2

## 2022-06-30 NOTE — Progress Notes (Signed)
Patient demonstrating behavior of paranoia this morning. Refusing lab to draw and collect blood specimens this morning. Nurse Glass blower/designer attempting to encourage her about the importance of having lab work done. Continues to refuse expressing having her blood drawn too many times.

## 2022-06-30 NOTE — Progress Notes (Signed)
Physical Therapy Session Note  Patient Details  Name: Kristin Simmons MRN: [id number] Date of Birth: 09-18-1985  Today's Date: 06/30/2022 PT Individual Time: 1st Treatment Session: 0915-1000; 2nd Treatment Session: 1100-1200 PT Individual Time Calculation (min): 45 min; 60 min  Short Term Goals: Week 2:  PT Short Term Goal 1 (Week 2): Patient will complete bed/chair transfer with MinA PT Short Term Goal 2 (Week 2): Patient will ambulate x50' with LRAD and ModA x1 PT Short Term Goal 3 (Week 2): Patient will initiate stair training  Skilled Therapeutic Interventions/Progress Updates:  1st Treatment Session-  Patient greeted sitting upright in wheelchair and transitioned from OT care- Patient agreeable to PT treatment session. Patient wheeled to/from hallway in main gym for time management.   Patient gait trained x30' with R HR and MinA for L LE management- L anterior AFO donned for improved foot clearance and to improve knee buckling in stance phase. Patient with good advancement of L LE throughout swing phase of gait, however continues to require assistance for foot placement and blocking L knee in stance phase to improve overall stability, as well as providing approximation at L hip.   Patient gait trained x30' with ARJO and Min/ModA with rehab tech in front managing device- Patient presents with significantly rotated hips to L and poor L TKE throughout stance phase. Gait discontinued with this device.   Patient gait trained x75' with R UE around second therapist and primary therapist on a stool managing L LE throughout gait trial- Therapist providing same facilitation and TC as above. Second therapist assisting with hip rotation.    Upon returning to the room, patient requesting to use the restroom. Patient performed stand pivot transfer to/from TIS wheelchair and commode over toilet BM with R hand rail and ModA. Patient advanced L LE throughout, however continues to require assistance for  blocking knee in stance phase. Patient with continent BM and was able to perform pericare, however required assistance for brief and pants management.   Patient left reclined in TIS wheelchair with posey belt and seat belt on, call bell within reach and all needs met.    2nd Treatment Session-  Patient greeted sitting in TIS wheelchair in her room and agreeable to PT treatment session. Patient gait trained x180' with R UE around rehab tech and therapist sitting on a stool facilitating improved L foot placement, as well as blocking L knee and approximating L hip to improve overall stability during stance phase.    Attempted sit/stand with R LE on 4" block and 2" block, however patient was unable to stand due to increased challenge of activity and discontinued.   Patient performed sit/stands without UE support and yellow TB around her waist as resistance with rehab tech pulling tighter on the R side vs the L side in order to promote neutral hip alignment and posterior chain recruitment. Performed 2 x 5 with seated rest break in between and therapist providing facilitation for increased L LE weight bearing, improved L TKE and improved L hip extension with R rotation.   Patient left sitting reclined in her TIS wheelchair in her room with call bell within reach, posey belt and seatbelt on, mother present and all needs met.    Therapy Documentation Precautions:  Precautions Precautions: Fall Precaution Comments: R craniectomy, no bone flap, bone flap placed in abdomen, R hand mitt, coretrak, flexiseal Restrictions Weight Bearing Restrictions: No  Pain: L shoulder pain, however improved with positioning and use of sling   Therapy/Group:  Individual Therapy     06/30/2022, 7:53 AM

## 2022-06-30 NOTE — Progress Notes (Signed)
Speech Language Pathology Daily Session Note  Patient Details  Name: Kristin Simmons MRN: [id number] Date of Birth: 1985/09/28  Today's Date: 06/30/2022 SLP Individual Time: 0725-0823 SLP Individual Time Calculation (min): 58 min  Short Term Goals: Week 2: SLP Short Term Goal 1 (Week 2): Patient will consume current diet with minimal overt s/s of aspiration with supervision supervision level verbal cues for use of swallowing compensatory strategies. SLP Short Term Goal 2 (Week 2): Patient will demonstrate sustained attention to functional tasks for 30 minutes with Mod verbal cues for redirection. SLP Short Term Goal 3 (Week 2): Patient will attend to left visual field during functional tasks with Min A multimodal cues. SLP Short Term Goal 4 (Week 2): Patient will demonstrate functional problem solving for mildly complex tasks with Min verbal cues. SLP Short Term Goal 5 (Week 2): Patient will self-monitor and correct errors during functional tasks with Min verbal and visual cues. SLP Short Term Goal 6 (Week 2): Patient will recall new, daily information with Mod multimodal cues.  Skilled Therapeutic Interventions: Skilled treatment session focused on dysphagia and cognitive goals. SLP facilitated session by providing Min-Mod verbal cues for use of swallowing compensatory strategies during breakfast meal of Dys. 3 textures with thin liquids. No overt s/s of aspiration observed. Recommend patient continue current diet. SLP also facilitated session by providing Mod A verbal cues for functional problem solving, sustained attention, and left visual scanning during a mildly complex scheduling task. Patient left upright in bed with alarm on and all needs within reach. Continue with current plan of care.       Pain Pain in right eye Nursing made aware  Therapy/Group: Individual Therapy  ,  06/30/2022, 3:56 PM

## 2022-06-30 NOTE — Progress Notes (Signed)
Occupational Therapy Session Note  Patient Details  Name: Kristin Simmons MRN: [id number] Date of Birth: 1985-09-21  Today's Date: 06/30/2022 OT Individual Time: 4098-1191 OT Individual Time Calculation (min): 30 min    Short Term Goals: Week 1:  OT Short Term Goal 1 (Week 1): Pt will sit EOM with MIN-MOD A for 5 min during funcitoanl task in prep for BADL OT Short Term Goal 1 - Progress (Week 1): Met OT Short Term Goal 2 (Week 1): Pt will locate items on L of sink with mod cuing OT Short Term Goal 2 - Progress (Week 1): Met OT Short Term Goal 3 (Week 1): Pt will verbalize 1 step of hemi dressing technique OT Short Term Goal 3 - Progress (Week 1): Met OT Short Term Goal 4 (Week 1): pt will demo improved awareness/decreased impulsivity by waiting for OT to transfer wiht no more than min cuing throughout session OT Short Term Goal 4 - Progress (Week 1): Met  Skilled Therapeutic Interventions/Progress Updates:     Pt received in bed with no pain.  ADL: Pt completes ADL at overall MOD Level. Skilled interventions include: total A dot don shoes prior to toileting. MIN A SPT with knee block to R and MOD A to L with Pt assiting with advancing LLE. Pt completes clothing management prior to otileting in standing with MIN A for balance and cuing to keep shifting towards wall on R. Dressing with MOD A todoff and don shirt/sling. MOD A +Vc for hemi strategies for LB dressing. Total A for footwear. Handoff to PT at end of session.  Therapy Documentation Precautions:  Precautions Precautions: Fall Precaution Comments: R craniectomy, no bone flap, bone flap placed in abdomen, R hand mitt, coretrak, flexiseal Restrictions Weight Bearing Restrictions: No  Therapy/Group: Individual Therapy  Shon Hale 06/30/2022, 6:49 AM

## 2022-06-30 NOTE — Progress Notes (Signed)
PROGRESS NOTE   Subjective/Complaints: No events overnight.  Patient states pressure behind eye is unchanged, however agreeable that she is more tolerant of light and extraocular movement today. Continues to have altered sensation in left upper and lower extremity; no longer painful to light touch Mother at bedside, all questions answered  Per sleep log, slept greater than 7 hours overnight, however interrupted. Ongoing diastolic hypertension, however this is much improved  ROS:  + R eye pressure/pain -stable/improving + R shoulder/neck pain/arm pain -improving + Lethargy/insomnia -ongoing + Abdominal itching -resolved  Denies fevers, chills, N/V, abdominal pain, diarrhea, SOB, cough, chest pain, new weakness or paraesthesias.    Objective:   CT ORBITS WO CONTRAST  Result Date: 06/29/2022 CLINICAL DATA:  Initial evaluation for pressure behind right eye, concern for retro-orbital hematoma. EXAM: CT ORBITS WITHOUT CONTRAST TECHNIQUE: Multidetector CT imaging of the orbits was performed using the standard protocol without intravenous contrast. Multiplanar CT image reconstructions were also generated. RADIATION DOSE REDUCTION: This exam was performed according to the departmental dose-optimization program which includes automated exposure control, adjustment of the mA and/or kV according to patient size and/or use of iterative reconstruction technique. COMPARISON:  None Available. FINDINGS: Orbits: Globes are symmetric in size with normal appearance in morphology. Right gaze noted. Optic nerves fairly symmetric and within normal limits. Minimal hazy asymmetric stranding noted within the right intraorbital fat, suspected to reflect a degree of venous congestion due to the evolving process within the right cerebral hemisphere (no frank retro-orbital hematoma or other collection. Extra-ocular muscles symmetric and within normal limits.  Lacrimal glands normal. Superior orbital veins grossly symmetric and within normal limits. No visualized laterally about the orbital apices or cavernous sinus. Visible paranasal sinuses: Clear. Soft tissues: Soft tissue swelling about the right frontal scalp with underlying craniectomy defect, better characterized on concomitant head CT. Remainder of the periorbital soft tissues are otherwise unremarkable. Osseous: Prior right pterional craniectomy. Remote posttraumatic defect at the left lamina papyracea. No acute osseous abnormality. No worrisome osseous lesions. 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 Scattered superimposed subarachnoid and subdural blood. Findings better characterized on concomitant head CT. IMPRESSION: 1. Minimal hazy asymmetric stranding within the right intraorbital fat, suspected to reflect a degree of passive venous congestion due to the evolving process within the right cerebral hemisphere. No frank retro-orbital hematoma or other collection. Changes of possible mild and/or early infection would be the primary differential consideration, and correlation with physical exam is recommended. 2. 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 Findings better evaluated on concomitant head CT. Electronically Signed   By: Rise Mu M.D.   On: 06/29/2022 20:19   CT HEAD WO CONTRAST ( )  Result Date: 06/29/2022 CLINICAL DATA:  Initial evaluation for pressure behind right eye. EXAM: CT HEAD WITHOUT CONTRAST TECHNIQUE: Contiguous axial images were obtained from the base of the skull through the vertex without intravenous contrast. RADIATION DOSE REDUCTION: This exam was performed according to the departmental dose-optimization program which includes automated exposure control, adjustment of the mA  and/or kV according to patient  size and/or use of iterative reconstruction technique. COMPARISON:  Comparison made with prior study from 06/13/2022 as well as concomitant CT of the orbits performed on the same day. FINDINGS: Brain: Postoperative changes from prior right pterional decompressive craniectomy. Evolving right MCA distribution infarct and edema within the underlying anterior right cerebral hemisphere again seen. Swelling has decreased since previous exam, with somewhat decreased protrusion of brain parenchyma through the craniectomy defect. Previously seen hyperdense blood products within this region have largely resolved. Residual trace scattered small volume subarachnoid blood noted. Residual small volume subdural blood seen along the falx and tentorium. This measures up to 2-3 mm in maximal thickness without significant mass effect. Persistent small superimposed subdural hygroma along the right falx. Persistent 8 mm left-to-right shift due to herniation through the craniectomy defect, similar to prior. Right lateral ventricle is somewhat increased in size as compared to previous, suspected to be due to evolving right cerebral encephalomalacia of rather than hydrocephalus. No new intracranial hemorrhage. No other new large vessel territory infarct. No visible mass lesion. 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 No visible hyperdense vessel, although evaluation limited by artifact. Skull: Prior right pterional craniectomy. Residual subgaleal collection overlying the craniectomy defect measures up to approximately 1.4 x 7.4 cm (series 3, image 20), decreased from prior. Skin staples have been removed in the interim. Sinuses/Orbits: Globes orbital soft tissues better evaluated on concomitant CT of the orbits. No retro-orbital hematoma visible. Visualized paranasal sinuses are clear. Mastoid air cells and middle ear cavities are well  pneumatized. Other: None. IMPRESSION: 1. Postoperative changes from prior right pterional decompressive craniectomy with underlying evolving right MCA distribution infarct. Swelling has decreased since prior, with decreased protrusion of brain parenchyma through the craniectomy defect. Previously seen hyperdense blood products within this region have largely resolved. 2. Residual small volume subdural blood along the falx and tentorium, measuring up to 2-3 mm without significant mass effect. 3. Persistent 8 mm left-to-right shift due to herniation through the craniectomy defect, similar to prior. Electronically Signed   By: Rise Mu M.D.   On: 06/29/2022 20:10   Recent Labs    06/27/22 1447  WBC 6.3  HGB 10.1*  HCT 30.5*  PLT 448*    Recent Labs    06/27/22 1447 06/29/22 1849  NA 140 138  K 3.8 3.7  CL 103 103  CO2 25 25  GLUCOSE 87 101*  BUN 13 12  CREATININE 0.71 0.79  CALCIUM 9.8 9.4     Intake/Output Summary (Last 24 hours) at 06/30/2022 1147 Last data filed at 06/29/2022 2215 Gross per 24 hour  Intake 354 ml  Output 200 ml  Net 154 ml         Physical Exam: Vital Signs Blood pressure (!) 138/99, pulse 82, temperature 98.6 F (37 C), temperature source Oral, resp. rate 16, height 5\' 4"  (1.626 m), weight 53.2 kg, SpO2 100 %. Constitutional: No apparent distress. Appropriate appearance for age.  Reclining in wheelchair.  HENT: No JVD. Neck Supple. Trachea midline. + R craniotomy site-minimal swelling, fluctuant, nontender eyes: PERRLA. EOMI grossly intact , poor tolerance for right eye in right lateral gaze-improved from last assessment Cardiovascular: RRR, no murmurs/rub/gallops. No Edema. Peripheral pulses 2+  Respiratory: CTAB. No rales, rhonchi, or wheezing. On RA.  Abdomen: + bowel sounds, normoactive. No distention or tenderness.  Core track removed GU: Not examined.   Skin: R craniectomy and R abdominal surgical sites - healing,  CDI.   +  Bilateral  periorbital hematoma, appears to be resolving, no appreciable edema.  No erythema, discharge, or warmth.  No exophthalmos.  MSK:      No apparent deformity.      Strength:                RUE: 5/5 SA, 5/5 EF, 5/5 EE, 5/5 WE, 5/5 FF, 5/5 FA                 LUE:  2/5 FF, 1/5 FA, 1/5 EF, 1/5 EE - improving                RLE: 5/5 HF, 5/5 KE, 5/5 DF, 5/5 EHL, 5/5 PF                 LLE:  3/5 HF, 3/5 KE, in AFO  Neurologic exam:  Cognition: AAO to person, place, and time Language: Fluent, No substitutions or neoglisms. No dysarthria.  + Some mild impulsivity , disinhibition -ongoing  Insight: Poor insight into current condition.  Mood: Pleasant affect, appropriate mood.  Sensation: Altered sensation to light touch in left upper extremity and left lower extremity -no longer painful or hypersensitive  Reflexes: 2+ in R UE and LE; LUE and LLE hyporeflexic. Negative Hoffman's and babinski signs bilaterally.  CN: + L tongue deviation Coordination: No apparent tremors.  Spasticity: MAS 0 left finger flexors, left hip adductors -improved   Assessment/Plan: 1. Functional deficits which require 3+ hours per day of interdisciplinary therapy in a comprehensive inpatient rehab setting. Physiatrist is providing close team supervision and 24 hour management of active medical problems listed below. Physiatrist and rehab team continue to assess barriers to discharge/monitor patient progress toward functional and medical goals  Care Tool:  Bathing    Body parts bathed by patient: Left arm, Chest, Abdomen, Right upper leg   Body parts bathed by helper: Right arm, Front perineal area, Buttocks, Left upper leg, Right lower leg, Left lower leg, Face     Bathing assist Assist Level: 2 Helpers     Upper Body Dressing/Undressing Upper body dressing   What is the patient wearing?: Pull over shirt    Upper body assist Assist Level: Total Assistance - Patient < 25%    Lower Body  Dressing/Undressing Lower body dressing      What is the patient wearing?: Pants     Lower body assist Assist for lower body dressing: Total Assistance - Patient < 25%     Toileting Toileting    Toileting assist Assist for toileting: Total Assistance - Patient < 25%     Transfers Chair/bed transfer  Transfers assist  Chair/bed transfer activity did not occur: Safety/medical concerns  Chair/bed transfer assist level: Maximal Assistance - Patient 25 - 49%     Locomotion Ambulation   Ambulation assist      Assist level: 2 helpers Assistive device: Other (comment) (Hemi-rail) Max distance: 30'   Walk 10 feet activity   Assist     Assist level: 2 helpers     Walk 50 feet activity   Assist Walk 50 feet with 2 turns activity did not occur: Safety/medical concerns (Patient unable to ambulate >30' at this time secondary to fatigue, poor endurance/activity tolerance, impaired attention, increased pain, etc.)         Walk 150 feet activity   Assist Walk 150 feet activity did not occur: Safety/medical concerns         Walk 10 feet on uneven surface  activity  Assist Walk 10 feet on uneven surfaces activity did not occur: Safety/medical concerns         Wheelchair     Assist Is the patient using a wheelchair?: Yes Type of Wheelchair: Manual (per therapist, pt using TIS/Recline back wheelchair)    Wheelchair assist level: Dependent - Patient 0% Max wheelchair distance: 150'    Wheelchair 50 feet with 2 turns activity    Assist        Assist Level: Dependent - Patient 0%   Wheelchair 150 feet activity     Assist      Assist Level: Dependent - Patient 0%   Blood pressure (!) 138/99, pulse 82, temperature 98.6 F (37 C), temperature source Oral, resp. rate 16, height 5\' 4"  (1.626 m), weight 53.2 kg, SpO2 100 %.  1. 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 Conchita Paris. Bone flap in abdominal pocket, plan for subacute flap replacement after IPR             -patient may  shower if cover incisions             -ELOS/Goals: 6/5 goal DC date; supervision to min A   Safety precautions due to poor impulse control - Helmet ordered for OOB 5/8; WHO and [facility] 5/9  Continue CIR   - 5/14: Starting to advance L leg with some buckling, Min A goals seem appropriate. SLP attention, recall improving but family is triggering and she has behaviors when family comes in during therapies.  family meeting - 1 pm on Thursday  5/15: CT head showing improved edema, small SDH, and unchanged 8 mm midline shift  2.  Antithrombotics: -DVT/anticoagulation:  Pharmaceutical: Lovenox initiated 07/04/2022             -antiplatelet therapy: N/A  3. Pain Management/neuropathic pain: Oxycodone as needed  5/8: added lidocaine patch x2 to L neck, shoulder  5/9: Add gabapentin 300 mg TID for LUE and LLE pain, likely related to spasticity and neuropathic.   5/10: Mild lethargy today but no use of PRN pain medications; decrease gabapentin to 100 mg TID  5/11: kpad and left shoulder sling ordered  5/23: Using oxycodone Q4H; continue  5/14: Using tylenol and oxycodone together consistently; schedule tylenol 1000 mg TID and oxycodone 5 mg TID; add oxycodone 5 mg BID Prn for breakthrough  -- Increase gabapentin to 200/200/400 mg daily  5/16: Increase gabapentin to 200/200/600 mg for pain and sleep  4. Mood/Behavior/Sleep: PRN twice daily as needed provide emotional support             -antipsychotic agents: N/A  - Sleep log added - poor sleep 3 hours last night. Add melatonin 5 mg QHS + PRN trazodone 50-75 mg.   5/10: Slept 7 hours overnight, still endorsing poor sleep.  Did not use as needed trazodone, will add 50 mg standing nightly.  5/11: continue trazodone  5/13: Per log slept 5 hours overnight, however was not updated  5-10: Will recheck tomorrow  5.14: slept 7 hours overnight; see above for increased QHS gabapentin  5/15: s sleep log not completed overnight, informed nursing.  Patient endorses ongoing poor sleep, no improvement with increase gabapentin.  5/16: interrupted sleep 7+ hours; increase trazodone to 75 mg and increase gabapentin as above  5. Neuropsych/cognition: This patient is not capable of making decisions on her own behalf -not consistently oriented, impulsive  with poor awareness..   - Obs for safety  6. Skin/Wound Care: Routine skin checks  - 5/8: 3 weeks post-R craniectomy; ordered staple removal from scalp and abdomen. no apparent wounds will DC rectal tube    7. Dysphagia: placed order for Cortrak removal 5/12 Diet Orders (From admission, onward)     Start     Ordered   06/22/22 0845  DIET DYS 3 Room service appropriate? Yes with Assist; Fluid consistency: Thin; Fluid restriction: 1500 mL Fluid  Diet effective now       Comments: Meds whole with liquid; full supervision with meals  Question Answer Comment  Room service appropriate? Yes with Assist   Fluid consistency: Thin   Fluid restriction: 1500 mL Fluid      06/22/22 0844          5/13: Monitor POs with cortrak removal 5/16: intakes doing well! monitor  8.  Hyponatremia.  Continue sodium chloride tablets.  Florinef 0.3 mg 3 times daily  - 5/8: Na 134 this AM. Downtrending over last 2 days. No obvious medication contributors; Already on salt tabs 2g TID. Placed 1500 ml fluid restriction, repeat today 138. Ordered daily BMP x3 days for monitoring. Reduce to Florinef 0.1 mg TID and monitor.   5/9: Na stable 13. Diastolic BP improved, remains high systolic. Orthostatic vitals negative with BP. Given florinef indication is hyponatremia not orthostasis will continue 0.1 mg TID through Friday then DC if no significant drop in Na.   5-10: Na down to 135 with mildly increased lethargy.  Increase Florinef to 0.2 mg 3 times daily,  continue daily BMP for 3 days.  5/12: Na improved to 136, decrease Florinef to 0.1mg  TID given hypertension  5/13: NA 136 with decreased Florinef; reduced dosing to twice daily and recheck BMP Wednesday  5/14: DC florinef d/t persistent hypertension; labs pending  5/16: Na stable; will check today, then reduce Na starting tomorrow if appropriate with repeat labs Sunday  10.  Decreased nutritional storage.  Currently on a dysphagia #3 diet.  NGT d/ced   - 5/14: DC SSI, CBG  11.  Urinary retention.  Urecholine 5 mg 3 times daily.  Check PVR - resolved  5/8: No PVRs overnight; ordered. Mixed continence overnight.   5/9: Continue UO and low PVRs. DC urecholine.   5/10: PVRs remain low, continent.  Continue for 24 hours, then DC PVRs.  12.  History of tobacco/alcohol use.  Counseling  13. Diarrhea. Remove rectal tube 5/8. Increase banatrol to TID.     - 5/9: No recorded Bms since rectal tube removal; reduce banatrol back to BID, obtain KUB  - 5/10: DC banatrol. KUB with mild stool burden.  If no BM today, will need to start laxatives.   5/11: magnesium citrate ordered  5/12: flowsheet reviewd and had type 5 stools today.   5/13: soft stools, last BM 5/12, montior  5/15: Liquid Bms 5/13; now on PO diet, monitor  5/16: add colace 100 mg BID + miralax daily PRN  14. HTN: magnesium citrate ordered, likely elevated due to Florinef, decrease to 1 tab TID    06/30/2022    4:50 AM 06/30/2022    3:05 AM 06/29/2022    7:48 PM  Vitals with BMI  Systolic 138 150 161  Diastolic 99 107 106  Pulse 82  82    - 5/13: PRN hydralazine used 1x overnight; BP remains significantly elevated with decreased Florinef.  Add amlodipine 5 mg daily.  - 5/14: diastolic  remains elevated; add lisinopril 5 mg daily. Will take 2-3 days for amlodipine effect. DC florinef - 5/15: Much improved;  DC lisinopril; Dc amlodipine this PM if remains normotensive 5/16: Diastolic remains elevated increase norvasc to 10 mg  daily  15. L eye pain/pressure: Getting MRI brain and orbits w/o contrast d/t worsening symptoms and poor EOMI tolerance; should have been improving with decreased postop edema   - 5/16: MRI converted to CT d/t clips likely to obscure relevant areas. Found "Minimal hazy asymmetric stranding within the right intraorbitalfat, suspected to reflect a degree of passive venous congestion due to the evolving process within the right cerebral hemisphere." No focal s/s cellulitis, but will get CBC today for eval. No management changes, reassured patient and family.   LOS: 9 days A FACE TO FACE EVALUATION WAS PERFORMED  Angelina Sheriff 06/30/2022, 11:47 AM

## 2022-06-30 NOTE — Progress Notes (Signed)
Patient ID: Kristin Simmons, female   DOB: 1985/07/22, 37 y.o.   MRN: [id number]  SW received updates from Jamie Brookes Disability Specialist with [facility]  reporting they are unable to accept referral because patient's mother initiated a claim already.   Patient/Family Editor, commissioning in attendance: [name] (mother) and [name] (children's father)   Haematologist in attendance: Dr. Shearon Stalls (attending), Vedia Pereyra (CM), Blanch Media (OT), [place] Rafoth (PT), Feliberto Gottron (SLP), and Cecile Sheerer (SW)  Main focus: discussed care needs at discharge  Synopsis of information shared:current medical condition in which pt has limited movement with UE and continues to require help, continued support with ADLs/IADLs, and requires physical assistance (Min Asst) with mobility, and helmet must be warn at all time. Pt also continuing to work with SLP; focusing on swallowing, eating, and thinking skills. Aware pt will requiring cueing to change topics at times, safety awareness, and scanning to left more due to left inattention. Pt is currently on soft foods- d3 thin due to impulsivity and stuffing food in mouth. Pt will need continued supervision during meals for safety reasons. 24/7 care recommended at all times.   Barriers/concerns expressed by patient and family: Family wanted explanation of telesitter.   Patient/family response: Family reports understanding, and happy about her progress. Family willing to provide 24/7 care needed. Fam edu scheduled with children's father [name] for 5/29, 30, 31 1pm-4pm.   Follow-up/action plans: SW discussed CAP/DA and PCS referral. Pt mother states CAP/DA letter has not arrived yet. SW reiterated PCS referral will be submitted closer towards discharge. SW will continue to provide updates as available.   Cecile Sheerer, MSW, LCSWA Office: [phone] Cell: [phone] Fax: [phone]

## 2022-07-01 DIAGNOSIS — L299 Pruritus, unspecified: Secondary | ICD-10-CM

## 2022-07-01 DIAGNOSIS — M25512 Pain in left shoulder: Secondary | ICD-10-CM

## 2022-07-01 DIAGNOSIS — I10 Essential (primary) hypertension: Secondary | ICD-10-CM

## 2022-07-01 DIAGNOSIS — R1312 Dysphagia, oropharyngeal phase: Secondary | ICD-10-CM

## 2022-07-01 DIAGNOSIS — K5900 Constipation, unspecified: Secondary | ICD-10-CM

## 2022-07-01 MED ORDER — POLYETHYLENE GLYCOL 3350 17 G PO PACK
17.0000 g | PACK | Freq: Every day | ORAL | Status: DC
  Administered 2022-07-01 – 2022-07-20 (×10): 17 g via ORAL
  Filled 2022-07-01 (×19): qty 1

## 2022-07-01 MED ORDER — DICLOFENAC SODIUM 1 % EX GEL
2.0000 g | Freq: Four times a day (QID) | CUTANEOUS | Status: DC
  Administered 2022-07-01 – 2022-07-19 (×47): 2 g via TOPICAL
  Filled 2022-07-01: qty 100

## 2022-07-01 MED ORDER — HYDROCERIN EX CREA
TOPICAL_CREAM | Freq: Two times a day (BID) | CUTANEOUS | Status: DC
  Filled 2022-07-01 (×2): qty 113

## 2022-07-01 NOTE — Progress Notes (Signed)
Occupational Therapy Session Note  Patient Details  Name: Kristin Simmons MRN: [id number] Date of Birth: 03/14/1985  Today's Date: 07/01/2022 Session 1 OT Individual Time: 4098-1191 OT Individual Time Calculation (min): 40 min   Session 2 OT Individual Time: 4782-9562 OT Individual Time Calculation (min): 75 min    Short Term Goals: Week 2:  OT Short Term Goal 1 (Week 2): Patient will demonstrate improved awarenress of L hemi body by positioning L UE prior to transfer OT Short Term Goal 2 (Week 2): Patient will complete 1 step of LB dressing task OT Short Term Goal 3 (Week 2): Patient will dress L UE first with mod A  Skilled Therapeutic Interventions/Progress Updates:  Session 1   Pt greeted supine in bed asleep. OT able to wake pt and agreeable to OT treatment session. Pt reported need to go to the bathroom. Stedy transfer onto [facility] over toilet with Min A of 1. Mod A for clothing management before sitting on toilet. Pt with continent void of bladder. Pt has been doing great with continence and pt agreeable to try underwear today. Dressing tasks completed from TIS wc with min/mod A for dynamic balance when reaching outside base of support to thread pants. Min A to stand but mod A for balance when trying to pull up pants. OT reviewed hemi dressing strategies and educated on novel way to thread hemi side through shirt sleeve. Pt demonstrated understanding. Max  A don shoes and L AFO. OT set pt up for breakfast with cues to slow down and for appropriate sized bites. OT braided patients hair and she was left seated in TIS wc with alarm belt on, call bell in reach, and needs met. Pain:  Pt reports neck pain, rest and repositioned  Session 2 Pt greeted semi-reclined in bed awake and agreeable to OT Treatment session. Pt completed bed mobility with mod A. Mod A for sitting balance initially, progressing to CGA for static sitting. She then needed mod A for dynamic sitting balance when donning  shoes and L AFO-max A. Pt pivoted to TIS wc with min A to the R.. OT issued soft 1/2 lap tray for L UE support and fit to TIS wc. L UE NMR with joint input through wrist and elbow to bring pt through full ROM. Some shoulder activation notes. OT issued soft yellow foam cube and worked on grasp/release. Pt able to initiate some grasp, but no finger extension. Applied 1:1 NMES to CH1 supraspinatus and middle deltoid to help approximate shoulder joint, and Ch 2 wrist extensors.  Ratio 1:1 Rate 35 pps Waveform- Asymmetric Ramp 1.0 Pulse 300 CH1 Intensity- 15  Duration -  15  CH1 Intensity- 17 Duration -  15  Pt returned to room and was set-up with lunch tray. Pt able to grasp utensil and scoop food bolus, but needed cues for appropriate bite size, slowing rate, and cues for pocketing on L side. Worked on L visual scanning with locating food items on L side of tray within self-feeding task. Pt left seated in wc at end of session with alarm belt on, call bell in reach, and needs met .  Therapy Documentation Precautions:  Precautions Precautions: Fall Precaution Comments: R craniectomy, no bone flap, bone flap placed in abdomen, R hand mitt, coretrak, flexiseal Restrictions Weight Bearing Restrictions: No Pain:  Pt reports neck pain, rest and repositioned  Therapy/Group: Individual Therapy  Mal Amabile 07/01/2022, 1:10 PM

## 2022-07-01 NOTE — Progress Notes (Signed)
Physical Therapy Session Note  Patient Details  Name: Kristin Simmons MRN: [id number] Date of Birth: 1985-06-15  Today's Date: 07/01/2022 PT Individual Time: 1300-1415 PT Individual Time Calculation (min): 75 min   Short Term Goals: Week 2:  PT Short Term Goal 1 (Week 2): Patient will complete bed/chair transfer with MinA PT Short Term Goal 2 (Week 2): Patient will ambulate x50' with LRAD and ModA x1 PT Short Term Goal 3 (Week 2): Patient will initiate stair training  Skilled Therapeutic Interventions/Progress Updates:  Patient greeted sitting in reclined in TIS wheelchair in her room and agreeable to PT treatment session. Therapist donned shoes/Lt anterior AFO, LUE sling and helmet for time management.   Patient gait trained x80' with R UE around rehab tech and therapist managing Lt LE throughout gait trial- Patient is able to advance Lt LE, however requires assistance for placement and VC for improved abduction as it scissors at times; Therapist also assisting with Lt TKE due to buckling with improvements noted with heel strike during initial contact of swing phase.   Patient gait trained another x44' with R HHA from rehab tech and therapist managing L LE- Same assistance and cues as above, however this time therapist assisting with Rt lateral weight shifting and R hip rotation for improved Lt LE swing phase of gait.   Patient performed 3 x 10 mini-squats without UE support and yellow TB connected to gait belt on the Rt side in order to promote Rt hip rotation and decrease Lt hip rotation. Therapist sitting on a stool managing L LE and hips throughout with mirror placed in front for external visual cues regarding posturing and alignment.   Patient performd 2 x 10 posterior pelvic tilts while standing without UE support and therapist managing Lt LE and hips throughout. Yellow TB connected to Rt side of gait belt as described above.   Patient stood with slight Rt UE support on tray table  while therapist managed Lt LE and hips and performed Rt foot taps to 4" step, performed 3 x 10 with extended seated rest break in between. Yellow TB connected to Rt side of gait belt as described above.   Patient returned to her room and stood from wheelchair with MinA and then performed stand pivot transfer to EOB with ModA and therapist managing Lt LE in stance phase in order to prevent buckling. Patient transitioned from sitting EOB to supine with Supv and VC for improved activation of Lt LE in order to place it on the bed. Patient left supine in bed with call bell within reach, bed alarm on, heating pad behind her neck, Lt UE propped on pillow and all needs met.   Therapy Documentation Precautions:  Precautions Precautions: Fall Precaution Comments: R craniectomy, no bone flap, bone flap placed in abdomen, R hand mitt, coretrak, flexiseal Restrictions Weight Bearing Restrictions: No  Pain: Reports Lt shoulder pain, however improved with positioning and heating pad at end of treatment session.    Therapy/Group: Individual Therapy     07/01/2022, 7:55 AM

## 2022-07-01 NOTE — Progress Notes (Signed)
PROGRESS NOTE   Subjective/Complaints: No events overnight.  Reports generalized itching, no rash.  Reports continued L shoulder and arm pain.   ROS:  + R eye pressure/pain -stable/improving + R shoulder/neck pain/arm pain + Lethargy/insomnia -ongoing + Abdominal itching -resolved + generalized itching  Denies fevers, chills, N/V, abdominal pain, diarrhea, SOB, cough, chest pain, new weakness or paraesthesias.    Objective:   CT ORBITS WO CONTRAST  Result Date: 06/29/2022 CLINICAL DATA:  Initial evaluation for pressure behind right eye, concern for retro-orbital hematoma. EXAM: CT ORBITS WITHOUT CONTRAST TECHNIQUE: Multidetector CT imaging of the orbits was performed using the standard protocol without intravenous contrast. Multiplanar CT image reconstructions were also generated. RADIATION DOSE REDUCTION: This exam was performed according to the departmental dose-optimization program which includes automated exposure control, adjustment of the mA and/or kV according to patient size and/or use of iterative reconstruction technique. COMPARISON:  None Available. FINDINGS: Orbits: Globes are symmetric in size with normal appearance in morphology. Right gaze noted. Optic nerves fairly symmetric and within normal limits. Minimal hazy asymmetric stranding noted within the right intraorbital fat, suspected to reflect a degree of venous congestion due to the evolving process within the right cerebral hemisphere (no frank retro-orbital hematoma or other collection. Extra-ocular muscles symmetric and within normal limits. Lacrimal glands normal. Superior orbital veins grossly symmetric and within normal limits. No visualized laterally about the orbital apices or cavernous sinus. Visible paranasal sinuses: Clear. Soft tissues: Soft tissue swelling about the right frontal scalp with underlying craniectomy defect, better characterized on concomitant head  CT. Remainder of the periorbital soft tissues are otherwise unremarkable. Osseous: Prior right pterional craniectomy. Remote posttraumatic defect at the left lamina papyracea. No acute osseous abnormality. No worrisome osseous lesions. 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 Scattered superimposed subarachnoid and subdural blood. Findings better characterized on concomitant head CT. IMPRESSION: 1. Minimal hazy asymmetric stranding within the right intraorbital fat, suspected to reflect a degree of passive venous congestion due to the evolving process within the right cerebral hemisphere. No frank retro-orbital hematoma or other collection. Changes of possible mild and/or early infection would be the primary differential consideration, and correlation with physical exam is recommended. 2. 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 Findings better evaluated on concomitant head CT. Electronically Signed   By: Rise Mu M.D.   On: 06/29/2022 20:19   CT HEAD WO CONTRAST ( )  Result Date: 06/29/2022 CLINICAL DATA:  Initial evaluation for pressure behind right eye. EXAM: CT HEAD WITHOUT CONTRAST TECHNIQUE: Contiguous axial images were obtained from the base of the skull through the vertex without intravenous contrast. RADIATION DOSE REDUCTION: This exam was performed according to the departmental dose-optimization program which includes automated exposure control, adjustment of the mA and/or kV according to patient size and/or use of iterative reconstruction technique. COMPARISON:  Comparison made with prior study from 06/13/2022 as well as concomitant CT of the orbits performed on the same day. FINDINGS: Brain: Postoperative changes from prior right pterional decompressive craniectomy. Evolving right MCA  distribution infarct and edema within  the underlying anterior right cerebral hemisphere again seen. Swelling has decreased since previous exam, with somewhat decreased protrusion of brain parenchyma through the craniectomy defect. Previously seen hyperdense blood products within this region have largely resolved. Residual trace scattered small volume subarachnoid blood noted. Residual small volume subdural blood seen along the falx and tentorium. This measures up to 2-3 mm in maximal thickness without significant mass effect. Persistent small superimposed subdural hygroma along the right falx. Persistent 8 mm left-to-right shift due to herniation through the craniectomy defect, similar to prior. Right lateral ventricle is somewhat increased in size as compared to previous, suspected to be due to evolving right cerebral encephalomalacia of rather than hydrocephalus. No new intracranial hemorrhage. No other new large vessel territory infarct. No visible mass lesion. 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 No visible hyperdense vessel, although evaluation limited by artifact. Skull: Prior right pterional craniectomy. Residual subgaleal collection overlying the craniectomy defect measures up to approximately 1.4 x 7.4 cm (series 3, image 20), decreased from prior. Skin staples have been removed in the interim. Sinuses/Orbits: Globes orbital soft tissues better evaluated on concomitant CT of the orbits. No retro-orbital hematoma visible. Visualized paranasal sinuses are clear. Mastoid air cells and middle ear cavities are well pneumatized. Other: None. IMPRESSION: 1. Postoperative changes from prior right pterional decompressive craniectomy with underlying evolving right MCA distribution infarct. Swelling has decreased since prior, with decreased protrusion of brain parenchyma through the craniectomy defect. Previously seen hyperdense blood products  within this region have largely resolved. 2. Residual small volume subdural blood along the falx and tentorium, measuring up to 2-3 mm without significant mass effect. 3. Persistent 8 mm left-to-right shift due to herniation through the craniectomy defect, similar to prior. Electronically Signed   By: Rise Mu M.D.   On: 06/29/2022 20:10   Recent Labs    06/30/22 1252  WBC 5.3  HGB 11.4*  HCT 35.0*  PLT 505*    Recent Labs    06/29/22 1849 06/30/22 1252  NA 138 137  K 3.7 4.2  CL 103 102  CO2 25 24  GLUCOSE 101* 99  BUN 12 9  CREATININE 0.79 0.73  CALCIUM 9.4 9.8     Intake/Output Summary (Last 24 hours) at 07/01/2022 1336 Last data filed at 07/01/2022 0700 Gross per 24 hour  Intake 297 ml  Output 500 ml  Net -203 ml         Physical Exam: Vital Signs Blood pressure 122/86, pulse 92, temperature 98.5 F (36.9 C), resp. rate 18, height 5\' 4"  (1.626 m), weight 53.2 kg, SpO2 98 %. Constitutional: No apparent distress. Appropriate appearance for age.  Reclining in wheelchair.  HENT: No JVD. Neck Supple. Trachea midline. + R craniotomy site-minimal swelling, fluctuant, nontender eyes: PERRLA. EOMI grossly intact , poor tolerance for right eye in right lateral gaze-improved from last assessment Cardiovascular: RRR, no murmurs/rub/gallops. No Edema. Peripheral pulses 2+  Respiratory: CTAB. No rales, rhonchi, or wheezing. On RA.  Abdomen: + bowel sounds, normoactive. No distention or tenderness.  Core track removed GU: Not examined.   Skin: R craniectomy and R abdominal surgical sites - healing,  CDI.  Skin appears slightly dry on arms and legs. No rash noted + Bilateral periorbital hematoma, appears to be resolving, no appreciable edema.  No erythema, discharge, or warmth.  No exophthalmos.  MSK:     Tenderness L shoulder, pain with ROM      Strength:  RUE: 5/5 SA, 5/5 EF, 5/5 EE, 5/5 WE, 5/5 FF, 5/5 FA                 LUE:  2/5 FF, 1/5 FA, 1/5  EF, 1/5 EE - improving                RLE: 5/5 HF, 5/5 KE, 5/5 DF, 5/5 EHL, 5/5 PF                 LLE:  3/5 HF, 3/5 KE, in AFO  Neurologic exam:  Cognition: AAO to person, place, and time Language: Fluent, No substitutions or neoglisms. No dysarthria.  + Some mild impulsivity , disinhibition -ongoing  Insight: Poor insight into current condition.  Mood: Pleasant affect, appropriate mood.  Sensation: Altered sensation to light touch in left upper extremity and left lower extremity -no longer painful or hypersensitive  Reflexes: 2+ in R UE and LE; LUE and LLE hyporeflexic. Negative Hoffman's and babinski signs bilaterally.  CN: + L tongue deviation Coordination: No apparent tremors.  Spasticity: MAS 0 left finger flexors, left hip adductors -improved   Assessment/Plan: 1. Functional deficits which require 3+ hours per day of interdisciplinary therapy in a comprehensive inpatient rehab setting. Physiatrist is providing close team supervision and 24 hour management of active medical problems listed below. Physiatrist and rehab team continue to assess barriers to discharge/monitor patient progress toward functional and medical goals  Care Tool:  Bathing    Body parts bathed by patient: Left arm, Chest, Abdomen, Right upper leg   Body parts bathed by helper: Right arm, Front perineal area, Buttocks, Left upper leg, Right lower leg, Left lower leg, Face     Bathing assist Assist Level: 2 Helpers     Upper Body Dressing/Undressing Upper body dressing   What is the patient wearing?: Pull over shirt    Upper body assist Assist Level: Total Assistance - Patient < 25%    Lower Body Dressing/Undressing Lower body dressing      What is the patient wearing?: Pants     Lower body assist Assist for lower body dressing: Total Assistance - Patient < 25%     Toileting Toileting    Toileting assist Assist for toileting: Total Assistance - Patient < 25%     Transfers Chair/bed  transfer  Transfers assist  Chair/bed transfer activity did not occur: Safety/medical concerns  Chair/bed transfer assist level: Maximal Assistance - Patient 25 - 49%     Locomotion Ambulation   Ambulation assist      Assist level: 2 helpers Assistive device: Other (comment) (Hemi-rail) Max distance: 30'   Walk 10 feet activity   Assist     Assist level: 2 helpers     Walk 50 feet activity   Assist Walk 50 feet with 2 turns activity did not occur: Safety/medical concerns (Patient unable to ambulate >30' at this time secondary to fatigue, poor endurance/activity tolerance, impaired attention, increased pain, etc.)         Walk 150 feet activity   Assist Walk 150 feet activity did not occur: Safety/medical concerns         Walk 10 feet on uneven surface  activity   Assist Walk 10 feet on uneven surfaces activity did not occur: Safety/medical concerns         Wheelchair     Assist Is the patient using a wheelchair?: Yes Type of Wheelchair: Manual (per therapist, pt using TIS/Recline back wheelchair)    Wheelchair assist  level: Dependent - Patient 0% Max wheelchair distance: 150'    Wheelchair 50 feet with 2 turns activity    Assist        Assist Level: Dependent - Patient 0%   Wheelchair 150 feet activity     Assist      Assist Level: Dependent - Patient 0%   Blood pressure 122/86, pulse 92, temperature 98.5 F (36.9 C), resp. rate 18, height 5\' 4"  (1.626 m), weight 53.2 kg, SpO2 98 %.  1. 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 Conchita Paris. Bone flap in abdominal pocket, plan for subacute flap replacement after IPR             -patient may  shower if cover incisions             -ELOS/Goals: 6/5 goal DC date; supervision to min A   Safety precautions due to poor impulse control - Helmet  ordered for OOB 5/8; WHO and [facility] 5/9  Continue CIR   - 5/14: Starting to advance L leg with some buckling, Min A goals seem appropriate. SLP attention, recall improving but family is triggering and she has behaviors when family comes in during therapies.  family meeting - 1 pm on Thursday  5/15: CT head showing improved edema, small SDH, and unchanged 8 mm midline shift  2.  Antithrombotics: -DVT/anticoagulation:  Pharmaceutical: Lovenox initiated 07/04/2022             -antiplatelet therapy: N/A  3. Pain Management/neuropathic pain: Oxycodone as needed  5/8: added lidocaine patch x2 to L neck, shoulder  5/9: Add gabapentin 300 mg TID for LUE and LLE pain, likely related to spasticity and neuropathic.   5/10: Mild lethargy today but no use of PRN pain medications; decrease gabapentin to 100 mg TID  5/11: kpad and left shoulder sling ordered  5/23: Using oxycodone Q4H; continue  5/14: Using tylenol and oxycodone together consistently; schedule tylenol 1000 mg TID and oxycodone 5 mg TID; add oxycodone 5 mg BID Prn for breakthrough  -- Increase gabapentin to 200/200/400 mg daily  5/16: Increase gabapentin to 200/200/600 mg for pain and sleep  5/17 try voltaren gel L shoulder  4. Mood/Behavior/Sleep: PRN twice daily as needed provide emotional support             -antipsychotic agents: N/A  - Sleep log added - poor sleep 3 hours last night. Add melatonin 5 mg QHS + PRN trazodone 50-75 mg.   5/10: Slept 7 hours overnight, still endorsing poor sleep.  Did not use as needed trazodone, will add 50 mg standing nightly.  5/11: continue trazodone  5/13: Per log slept 5 hours overnight, however was not updated 5-10: Will recheck tomorrow  5.14: slept 7 hours overnight; see above for increased QHS gabapentin  5/15: s sleep log not completed overnight, informed nursing.  Patient endorses ongoing poor sleep, no improvement with increase gabapentin.  5/16: interrupted sleep 7+ hours; increase trazodone to  75 mg and increase gabapentin as above  5. Neuropsych/cognition: This patient is not capable of making decisions on her own behalf -not consistently oriented, impulsive with poor awareness..   - Obs for safety  6. Skin/Wound Care: Routine skin checks  - 5/8: 3 weeks post-R craniectomy; ordered staple removal from scalp and abdomen. no apparent wounds will DC rectal tube    7. Dysphagia: placed order for Cortrak removal  5/12 Diet Orders (From admission, onward)     Start     Ordered   06/22/22 0845  DIET DYS 3 Room service appropriate? Yes with Assist; Fluid consistency: Thin; Fluid restriction: 1500 mL Fluid  Diet effective now       Comments: Meds whole with liquid; full supervision with meals  Question Answer Comment  Room service appropriate? Yes with Assist   Fluid consistency: Thin   Fluid restriction: 1500 mL Fluid      06/22/22 0844          5/13: Monitor POs with cortrak removal 5/16: intakes doing well! Monitor 5/17 intake appears good overall  8.  Hyponatremia.  Continue sodium chloride tablets.  Florinef 0.3 mg 3 times daily  - 5/8: Na 134 this AM. Downtrending over last 2 days. No obvious medication contributors; Already on salt tabs 2g TID. Placed 1500 ml fluid restriction, repeat today 138. Ordered daily BMP x3 days for monitoring. Reduce to Florinef 0.1 mg TID and monitor.   5/9: Na stable 13. Diastolic BP improved, remains high systolic. Orthostatic vitals negative with BP. Given florinef indication is hyponatremia not orthostasis will continue 0.1 mg TID through Friday then DC if no significant drop in Na.   5-10: Na down to 135 with mildly increased lethargy.  Increase Florinef to 0.2 mg 3 times daily, continue daily BMP for 3 days.  5/12: Na improved to 136, decrease Florinef to 0.1mg  TID given hypertension  5/13: NA 136 with decreased Florinef; reduced dosing to twice daily and recheck BMP Wednesday  5/14: DC florinef d/t persistent hypertension; labs  pending  5/16: Na stable; will check today, then reduce Na starting tomorrow if appropriate with repeat labs Sunday  10.  Decreased nutritional storage.  Currently on a dysphagia #3 diet.  NGT d/ced   - 5/14: DC SSI, CBG  11.  Urinary retention.  Urecholine 5 mg 3 times daily.  Check PVR - resolved  5/8: No PVRs overnight; ordered. Mixed continence overnight.   5/9: Continue UO and low PVRs. DC urecholine.   5/10: PVRs remain low, continent.  Continue for 24 hours, then DC PVRs.  12.  History of tobacco/alcohol use.  Counseling  13. Diarrhea. Remove rectal tube 5/8. Increase banatrol to TID.     - 5/9: No recorded Bms since rectal tube removal; reduce banatrol back to BID, obtain KUB  - 5/10: DC banatrol. KUB with mild stool burden.  If no BM today, will need to start laxatives.   5/11: magnesium citrate ordered  5/12: flowsheet reviewd and had type 5 stools today.   5/13: soft stools, last BM 5/12, montior  5/15: Liquid Bms 5/13; now on PO diet, monitor  5/16: add colace 100 mg BID + miralax daily PRN  5/17 schedule miralax  14. HTN: magnesium citrate ordered, likely elevated due to Florinef, decrease to 1 tab TID    07/01/2022    5:15 AM 06/30/2022    7:38 PM 06/30/2022    2:42 PM  Vitals with BMI  Systolic 122 125 213  Diastolic 86 76 90  Pulse 92 85 81    - 5/13: PRN hydralazine used 1x overnight; BP remains significantly elevated with decreased Florinef.  Add amlodipine 5 mg daily.  - 5/14: diastolic remains elevated; add lisinopril 5 mg daily. Will take 2-3 days for amlodipine effect. DC florinef - 5/15: Much improved;  DC lisinopril; Dc amlodipine this PM if remains normotensive 5/16: Diastolic remains elevated increase norvasc to 10  mg daily 5/17 controlled overall, continue current  15. L eye pain/pressure: Getting MRI brain and orbits w/o contrast d/t worsening symptoms and poor EOMI tolerance; should have been improving with decreased postop edema   - 5/16: MRI  converted to CT d/t clips likely to obscure relevant areas. Found "Minimal hazy asymmetric stranding within the right intraorbitalfat, suspected to reflect a degree of passive venous congestion due to the evolving process within the right cerebral hemisphere." No focal s/s cellulitis, but will get CBC today for eval. No management changes, reassured patient and family.   16. Itching, generalized  -5/17 No rash noted, may be dry skin related, start moisturizer, continue benadryl crem    LOS: 10 days A FACE TO FACE EVALUATION WAS PERFORMED  Fanny Dance 07/01/2022, 1:36 PM

## 2022-07-02 MED ORDER — SODIUM CHLORIDE 1 G PO TABS
2.0000 g | ORAL_TABLET | Freq: Two times a day (BID) | ORAL | Status: DC
  Administered 2022-07-02 – 2022-07-04 (×4): 2 g via ORAL
  Filled 2022-07-02 (×4): qty 2

## 2022-07-02 NOTE — Progress Notes (Signed)
PROGRESS NOTE   Subjective/Complaints:  Pt denies pain this AM.  Ate 100% tray- wants grilled chicken and tomato sandwich with fries.  LBM 2 days ago- (had one right after seen pt).  Mother had some questions related to labs/eye, etc.     ROS:  + R eye pressure/pain -stable/improving + R shoulder/neck pain/arm pain + Lethargy/insomnia -ongoing + Abdominal itching -resolved + generalized itching   Pt denies SOB, abd pain, CP, N/V/C/D, and vision changes     Objective:   No results found. Recent Labs    06/30/22 1252  WBC 5.3  HGB 11.4*  HCT 35.0*  PLT 505*   Recent Labs    06/29/22 1849 06/30/22 1252  NA 138 137  K 3.7 4.2  CL 103 102  CO2 25 24  GLUCOSE 101* 99  BUN 12 9  CREATININE 0.79 0.73  CALCIUM 9.4 9.8    Intake/Output Summary (Last 24 hours) at 07/02/2022 1450 Last data filed at 07/02/2022 1231 Gross per 24 hour  Intake 288 ml  Output --  Net 288 ml        Physical Exam: Vital Signs Blood pressure 107/75, pulse 87, temperature 98.7 F (37.1 C), temperature source Oral, resp. rate 16, height 5\' 4"  (1.626 m), weight 53.2 kg, SpO2 99 %. Constitutional: sitting up in bed; mother at bedside; Appropriate appearance for age.    HENT: No JVD. Neck Supple. Trachea midline. + R craniotomy site-minimal swelling, fluctuant, nontender eyes: PERRLA. EOMI grossly intact , poor tolerance for right eye in right lateral gaze-improved from last assessment- less bruising under eyes Cardiovascular: RRR- no JVD Respiratory: CTA B/L- no W/R/R.  Abdomen: soft, NT, ND, (+)BS GU: Not examined.   Skin: R craniectomy and R abdominal surgical sites - healing,  CDI.  Skin appears slightly dry on arms and legs. No rash noted + Bilateral periorbital hematoma, appears to be resolving, no appreciable edema.  No erythema, discharge, or warmth.  No exophthalmos.  MSK:     Tenderness L shoulder, pain with ROM       Strength:                RUE: 5/5 SA, 5/5 EF, 5/5 EE, 5/5 WE, 5/5 FF, 5/5 FA                 LUE:  2/5 FF, 1/5 FA, 1/5 EF, 1/5 EE - improving                RLE: 5/5 HF, 5/5 KE, 5/5 DF, 5/5 EHL, 5/5 PF                 LLE:  3/5 HF, 3/5 KE, in AFO  Neurologic exam:  Cognition: AAO to person, place, and time Language: Fluent, No substitutions or neoglisms. No dysarthria.  + Some mild impulsivity , disinhibition -ongoing  Insight: Poor insight into current condition.  Mood: Pleasant affect, appropriate mood.  Sensation: Altered sensation to light touch in left upper extremity and left lower extremity -no longer painful or hypersensitive  Reflexes: 2+ in R UE and LE; LUE and LLE hyporeflexic. Negative Hoffman's and babinski signs bilaterally.  CN: + L  tongue deviation Coordination: No apparent tremors.  Spasticity: MAS 0 left finger flexors, left hip adductors -improved   Assessment/Plan: 1. Functional deficits which require 3+ hours per day of interdisciplinary therapy in a comprehensive inpatient rehab setting. Physiatrist is providing close team supervision and 24 hour management of active medical problems listed below. Physiatrist and rehab team continue to assess barriers to discharge/monitor patient progress toward functional and medical goals  Care Tool:  Bathing    Body parts bathed by patient: Left arm, Chest, Abdomen, Right upper leg   Body parts bathed by helper: Right arm, Front perineal area, Buttocks, Left upper leg, Right lower leg, Left lower leg, Face     Bathing assist Assist Level: Moderate Assistance - Patient 50 - 74%     Upper Body Dressing/Undressing Upper body dressing   What is the patient wearing?: Pull over shirt    Upper body assist Assist Level: Moderate Assistance - Patient 50 - 74%    Lower Body Dressing/Undressing Lower body dressing      What is the patient wearing?: Pants     Lower body assist Assist for lower body dressing: Maximal  Assistance - Patient 25 - 49%     Toileting Toileting    Toileting assist Assist for toileting: Maximal Assistance - Patient 25 - 49%     Transfers Chair/bed transfer  Transfers assist  Chair/bed transfer activity did not occur: Safety/medical concerns  Chair/bed transfer assist level: Moderate Assistance - Patient 50 - 74%     Locomotion Ambulation   Ambulation assist      Assist level: 2 helpers Assistive device: Other (comment) (Hemi-rail) Max distance: 30'   Walk 10 feet activity   Assist     Assist level: 2 helpers     Walk 50 feet activity   Assist Walk 50 feet with 2 turns activity did not occur: Safety/medical concerns (Patient unable to ambulate >30' at this time secondary to fatigue, poor endurance/activity tolerance, impaired attention, increased pain, etc.)         Walk 150 feet activity   Assist Walk 150 feet activity did not occur: Safety/medical concerns         Walk 10 feet on uneven surface  activity   Assist Walk 10 feet on uneven surfaces activity did not occur: Safety/medical concerns         Wheelchair     Assist Is the patient using a wheelchair?: Yes Type of Wheelchair: Manual (per therapist, pt using TIS/Recline back wheelchair)    Wheelchair assist level: Dependent - Patient 0% Max wheelchair distance: 150'    Wheelchair 50 feet with 2 turns activity    Assist        Assist Level: Dependent - Patient 0%   Wheelchair 150 feet activity     Assist      Assist Level: Dependent - Patient 0%   Blood pressure 107/75, pulse 87, temperature 98.7 F (37.1 C), temperature source Oral, resp. rate 16, height 5\' 4"  (1.626 m), weight 53.2 kg, SpO2 99 %.  1. 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 Conchita Paris. Bone flap in abdominal pocket, plan for subacute flap replacement  after IPR             -patient may  shower if cover incisions             -ELOS/Goals: 6/5 goal DC date; supervision  to min A   Safety precautions due to poor impulse control - Helmet ordered for OOB 5/8; WHO and [facility] 5/9  Continue CIR   - 5/14: Starting to advance L leg with some buckling, Min A goals seem appropriate. SLP attention, recall improving but family is triggering and she has behaviors when family comes in during therapies.  family meeting - 1 pm on Thursday  5/15: CT head showing improved edema, small SDH, and unchanged 8 mm midline shift  5/18- Con't CIR PT, OT and SLP 2.  Antithrombotics: -DVT/anticoagulation:  Pharmaceutical: Lovenox initiated 07/04/2022             -antiplatelet therapy: N/A  3. Pain Management/neuropathic pain: Oxycodone as needed  5/8: added lidocaine patch x2 to L neck, shoulder  5/9: Add gabapentin 300 mg TID for LUE and LLE pain, likely related to spasticity and neuropathic.   5/10: Mild lethargy today but no use of PRN pain medications; decrease gabapentin to 100 mg TID  5/11: kpad and left shoulder sling ordered  5/23: Using oxycodone Q4H; continue  5/14: Using tylenol and oxycodone together consistently; schedule tylenol 1000 mg TID and oxycodone 5 mg TID; add oxycodone 5 mg BID Prn for breakthrough  -- Increase gabapentin to 200/200/400 mg daily  5/16: Increase gabapentin to 200/200/600 mg for pain and sleep  5/17 try voltaren gel L shoulder  5/18- denies pain this AM 4. Mood/Behavior/Sleep: PRN twice daily as needed provide emotional support             -antipsychotic agents: N/A  - Sleep log added - poor sleep 3 hours last night. Add melatonin 5 mg QHS + PRN trazodone 50-75 mg.   5/10: Slept 7 hours overnight, still endorsing poor sleep.  Did not use as needed trazodone, will add 50 mg standing nightly.  5/11: continue trazodone  5/13: Per log slept 5 hours overnight, however was not updated 5-10: Will recheck tomorrow  5.14: slept 7 hours  overnight; see above for increased QHS gabapentin  5/15: s sleep log not completed overnight, informed nursing.  Patient endorses ongoing poor sleep, no improvement with increase gabapentin.  5/16: interrupted sleep 7+ hours; increase trazodone to 75 mg and increase gabapentin as above  5/18- sleeping better 5. Neuropsych/cognition: This patient is not capable of making decisions on her own behalf -not consistently oriented, impulsive with poor awareness..   - Obs for safety  6. Skin/Wound Care: Routine skin checks  - 5/8: 3 weeks post-R craniectomy; ordered staple removal from scalp and abdomen. no apparent wounds will DC rectal tube    7. Dysphagia: placed order for Cortrak removal 5/12 Diet Orders (From admission, onward)     Start     Ordered   06/22/22 0845  DIET DYS 3 Room service appropriate? Yes with Assist; Fluid consistency: Thin; Fluid restriction: 1500 mL Fluid  Diet effective now       Comments: Meds whole with liquid; full supervision with meals  Question Answer Comment  Room service appropriate? Yes with Assist   Fluid consistency: Thin   Fluid restriction: 1500 mL Fluid      06/22/22 0844          5/13: Monitor POs with cortrak removal 5/18- eating well- wants grilled chicken and fries with tomato- agreed as long as cut up 8.  Hyponatremia.  Continue sodium chloride tablets.  Florinef 0.3 mg 3 times daily  - 5/8: Na 134 this AM. Downtrending over last 2 days. No obvious medication  contributors; Already on salt tabs 2g TID. Placed 1500 ml fluid restriction, repeat today 138. Ordered daily BMP x3 days for monitoring. Reduce to Florinef 0.1 mg TID and monitor.   5/9: Na stable 13. Diastolic BP improved, remains high systolic. Orthostatic vitals negative with BP. Given florinef indication is hyponatremia not orthostasis will continue 0.1 mg TID through Friday then DC if no significant drop in Na.   5-10: Na down to 135 with mildly increased lethargy.  Increase Florinef to  0.2 mg 3 times daily, continue daily BMP for 3 days.  5/12: Na improved to 136, decrease Florinef to 0.1mg  TID given hypertension  5/13: NA 136 with decreased Florinef; reduced dosing to twice daily and recheck BMP Wednesday  5/14: DC florinef d/t persistent hypertension; labs pending  5/16: Na stable; will check today, then reduce Na starting tomorrow if appropriate with repeat labs Sunday  5/18- will recheck labs in AM/Sunday- ordered labs- will also reduce Na levels to 2G BID with meals from TID 10.  Decreased nutritional storage.  Currently on a dysphagia #3 diet.  NGT d/ced   - 5/14: DC SSI, CBG  11.  Urinary retention.  Urecholine 5 mg 3 times daily.  Check PVR - resolved  5/8: No PVRs overnight; ordered. Mixed continence overnight.   5/9: Continue UO and low PVRs. DC urecholine.   5/10: PVRs remain low, continent.  Continue for 24 hours, then DC PVRs.  12.  History of tobacco/alcohol use.  Counseling  13. Diarrhea. Remove rectal tube 5/8. Increase banatrol to TID.     - 5/9: No recorded Bms since rectal tube removal; reduce banatrol back to BID, obtain KUB  - 5/10: DC banatrol. KUB with mild stool burden.  If no BM today, will need to start laxatives.   5/11: magnesium citrate ordered  5/12: flowsheet reviewd and had type 5 stools today.   5/13: soft stools, last BM 5/12, montior  5/15: Liquid Bms 5/13; now on PO diet, monitor  5/16: add colace 100 mg BID + miralax daily PRN  5/17 schedule miralax  5/18- last BM this AM- large-continent 14. HTN: magnesium citrate ordered, likely elevated due to Florinef, decrease to 1 tab TID    07/02/2022    1:20 PM 07/02/2022    5:00 AM 07/01/2022    8:17 PM  Vitals with BMI  Systolic 107 130 409  Diastolic 75 88 77  Pulse 87 88 92    - 5/13: PRN hydralazine used 1x overnight; BP remains significantly elevated with decreased Florinef.  Add amlodipine 5 mg daily.  - 5/14: diastolic remains elevated; add lisinopril 5 mg daily. Will take 2-3  days for amlodipine effect. DC florinef - 5/15: Much improved;  DC lisinopril; Dc amlodipine this PM if remains normotensive 5/16: Diastolic remains elevated increase norvasc to 10 mg daily 5/17 controlled overall, continue current  5/18- BP controlled- con't regimen 15. L eye pain/pressure: Getting MRI brain and orbits w/o contrast d/t worsening symptoms and poor EOMI tolerance; should have been improving with decreased postop edema   - 5/16: MRI converted to CT d/t clips likely to obscure relevant areas. Found "Minimal hazy asymmetric stranding within the right intraorbitalfat, suspected to reflect a degree of passive venous congestion due to the evolving process within the right cerebral hemisphere." No focal s/s cellulitis, but will get CBC today for eval. No management changes, reassured patient and family.   16. Itching, generalized  -5/17 No rash noted, may be dry skin related, start  moisturizer, continue benadryl crem    I spent a total of 43   minutes on total care today- >50% coordination of care- due to  D/w mother and review of chart to discuss the medical issues she's having; also to discuss the eye issues are OK- and Ok'd grilled chicken and fries if cut up for pt.    LOS: 11 days A FACE TO FACE EVALUATION WAS PERFORMED    07/02/2022, 2:50 PM

## 2022-07-02 NOTE — Progress Notes (Signed)
Occupational Therapy Session Note  Patient Details  Name: Kristin Simmons MRN: [id number] Date of Birth: 09-16-1985  Today's Date: 07/02/2022 OT Individual Time: 8295-6213 OT Individual Time Calculation (min): 75 min    Short Term Goals: Week 2:  OT Short Term Goal 1 (Week 2): Patient will demonstrate improved awarenress of L hemi body by positioning L UE prior to transfer OT Short Term Goal 2 (Week 2): Patient will complete 1 step of LB dressing task OT Short Term Goal 3 (Week 2): Patient will dress L UE first with mod A  Skilled Therapeutic Interventions/Progress Updates:    Pt greeted semi-reclined in bed with mother present and MD rounding. Pt agreeable to shower. Per mother, pt can only use Dove soap, as every other soap makes her itch. Mother gave OT Dove soap to use in the shower. Pt completed bed mobility with mod A and kept trying to lay back down. Pt eventually agreeable to get up. Stedy transfer with min A to [facility] over toilet. Mod A for clothing management. Pt with successful BM and voided bladder. Worked on hip hike for posterior peri-care with pt able to complete with min A. Stedy transfer then to shower chair. Bathing completed with hand over hand A to integrate L UE for neuro re-ed. Utilized cut out in roll in shower chair to wash peri-area and buttocks. Continued working on hemi dressing techniques with pt needing cues to recall strategies. Mod A for threading pant legs in figure 4 position and mod A for UB dressing as well. OT placed SAEBO e-stim on wrist extensors. SAEBO left on for 60 minutes. OT returned to remove SAEBO with skin intact and no adverse reactions.  Saebo Stim One 330 pulse width 35 Hz pulse rate On 8 sec/ off 8 sec Ramp up/ down 2 sec Symmetrical Biphasic wave form  Max intensity at 500 Ohm load  Pt left seated in TIS wc with alarm belt on, call bell in reach, and needs met.    Therapy Documentation Precautions:  Precautions Precautions:  Fall Precaution Comments: R craniectomy, no bone flap, bone flap placed in abdomen, R hand mitt, coretrak, flexiseal Restrictions Weight Bearing Restrictions: No Pain: Pain Assessment Pain Scale: 0-10 Pain Score: 7  Pain Type: Acute pain Pain Location: Eye Pain Orientation: Right Pain Descriptors / Indicators: Headache Pain Frequency: Constant Pain Onset: On-going Patients Stated Pain Goal: 4 Pain Intervention(s): Repositioned   Therapy/Group: Individual Therapy  Mal Amabile 07/02/2022, 9:33 AM

## 2022-07-03 LAB — CBC WITH DIFFERENTIAL/PLATELET
Abs Immature Granulocytes: 0.02 10*3/uL (ref 0.00–0.07)
Basophils Absolute: 0 10*3/uL (ref 0.0–0.1)
Basophils Relative: 1 %
Eosinophils Absolute: 0.2 10*3/uL (ref 0.0–0.5)
Eosinophils Relative: 4 %
HCT: 34.8 % — ABNORMAL LOW (ref 36.0–46.0)
Hemoglobin: 11.4 g/dL — ABNORMAL LOW (ref 12.0–15.0)
Immature Granulocytes: 1 %
Lymphocytes Relative: 41 %
Lymphs Abs: 1.8 10*3/uL (ref 0.7–4.0)
MCH: 30.6 pg (ref 26.0–34.0)
MCHC: 32.8 g/dL (ref 30.0–36.0)
MCV: 93.5 fL (ref 80.0–100.0)
Monocytes Absolute: 0.3 10*3/uL (ref 0.1–1.0)
Monocytes Relative: 8 %
Neutro Abs: 2 10*3/uL (ref 1.7–7.7)
Neutrophils Relative %: 45 %
Platelets: 399 10*3/uL (ref 150–400)
RBC: 3.72 MIL/uL — ABNORMAL LOW (ref 3.87–5.11)
RDW: 14.5 % (ref 11.5–15.5)
WBC: 4.3 10*3/uL (ref 4.0–10.5)
nRBC: 0 % (ref 0.0–0.2)

## 2022-07-03 LAB — BASIC METABOLIC PANEL
Anion gap: 11 (ref 5–15)
BUN: 10 mg/dL (ref 6–20)
CO2: 25 mmol/L (ref 22–32)
Calcium: 10 mg/dL (ref 8.9–10.3)
Chloride: 102 mmol/L (ref 98–111)
Creatinine, Ser: 0.66 mg/dL (ref 0.44–1.00)
GFR, Estimated: >60 mL/min (ref >60)
Glucose, Bld: 91 mg/dL (ref 70–99)
Potassium: 4.3 mmol/L (ref 3.5–5.1)
Sodium: 138 mmol/L (ref 135–145)

## 2022-07-03 LAB — GLUCOSE, CAPILLARY: Glucose-Capillary: 91 mg/dL (ref 70–99)

## 2022-07-03 NOTE — Progress Notes (Signed)
Occupational Therapy Session Note  Patient Details  Name: Kristin Simmons MRN: [id number] Date of Birth: 04-26-1985  Today's Date: 07/03/2022 OT Individual Time: 1015-1100 OT Individual Time Calculation (min): 45 min    Short Term Goals: Week 1:  OT Short Term Goal 1 (Week 1): Pt will sit EOM with MIN-MOD A for 5 min during funcitoanl task in prep for BADL OT Short Term Goal 1 - Progress (Week 1): Met OT Short Term Goal 2 (Week 1): Pt will locate items on L of sink with mod cuing OT Short Term Goal 2 - Progress (Week 1): Met OT Short Term Goal 3 (Week 1): Pt will verbalize 1 step of hemi dressing technique OT Short Term Goal 3 - Progress (Week 1): Met OT Short Term Goal 4 (Week 1): pt will demo improved awareness/decreased impulsivity by waiting for OT to transfer wiht no more than min cuing throughout session OT Short Term Goal 4 - Progress (Week 1): Met  Skilled Therapeutic Interventions/Progress Updates:    Patient in bed at the time of arrival, patient in good spirits and agreeable to completing functional task at sink LOF.  Patient was able to transfer from supine to EOB with MinA.  The pt maintained good sitting balance by incorporating the bed rail for additional balance with vc's for adjusting her position. The pt was MaxA for donning her shoes and brace.  The pt  was ModA for transferring from EOB to w/c LOF.  The pt was able to wash her face and hands at sink LOF.  The pt was positioned upright in the w/c and was able to comb her hair using her RUE while seated at the mirror.  The pt went on to complete UB exercises by threading BUE and demonstrating AROM  in various planes ,2 sets of 10 with rest break as needed.  The pt required 3 rest breaks. At the end of the session, the pt remained in the w/c with her safety belt in place and her call light and bedside table within reach.  All additional needs were addressed prior to exiting the room.   Therapy Documentation Precautions:   Precautions Precautions: Fall Precaution Comments: R craniectomy, no bone flap, bone flap placed in abdomen, R hand mitt, coretrak, flexiseal Restrictions Weight Bearing Restrictions: No Therapy/Group: Individual Therapy  Lavona Mound 07/03/2022, 12:12 PM

## 2022-07-03 NOTE — Progress Notes (Signed)
Physical Therapy Session Note  Patient Details  Name: Kristin Simmons MRN: [id number] Date of Birth: 1985/03/13  Today's Date: 07/03/2022 PT Individual Time: 1100-1156 PT Individual Time Calculation (min): 56 min   Short Term Goals: Week 1:  PT Short Term Goal 1 (Week 1): Patient will perform bed mobility with MinA PT Short Term Goal 1 - Progress (Week 1): Met PT Short Term Goal 2 (Week 1): Patient will perform bed/chair transfer with ModA x1 PT Short Term Goal 2 - Progress (Week 1): Met PT Short Term Goal 3 (Week 1): Patient will gait train x50' with LRAD and MaxA x1 PT Short Term Goal 3 - Progress (Week 1): Met PT Short Term Goal 4 (Week 1): Patient will be able to tolerate sitting upright in TIS wheelchair between treatment sessions PT Short Term Goal 4 - Progress (Week 1): Met  Skilled Therapeutic Interventions/Progress Updates:  Pt was seen bedside in the am finishing up with OT session. Pt transported to rehab gym. Pt performed sit to stand transfers and stand pivot transfers with mod A and verbal cues. Pt performed sit to stand blocked practice with block under R foot to increase WB through L LE, 5 sets x 5 reps each. Pt ambulated 25 feet x 2 and 50 feet x 1 with hemiwalker, L AFO and mod to max with verbal and physical cues required. Pt returned to room following treatment and left sitting up in w/c with chair alarm on and all needs within reach.   Therapy Documentation Precautions:  Precautions Precautions: Fall Precaution Comments: R craniectomy, no bone flap, bone flap placed in abdomen, R hand mitt, coretrak, flexiseal Restrictions Weight Bearing Restrictions: No General:   Vital Signs:   Pain: No c/o pain.     Therapy/Group: Individual Therapy  Rayford Halsted 07/03/2022, 12:54 PM

## 2022-07-03 NOTE — Progress Notes (Signed)
PROGRESS NOTE   Subjective/Complaints:   Pt reports says LBM last night and also yesterday AM.   Eyes still "the same".  Ate 75% of tray and still eating.  Denies complaints.   ROS:  + R eye pressure/pain -stable/improving + R shoulder/neck pain/arm pain + Lethargy/insomnia -ongoing + Abdominal itching -resolved + generalized itching   Pt denies SOB, abd pain, CP, N/V/C/D, and vision changes   Objective:   No results found. Recent Labs    06/30/22 1252 07/03/22 0538  WBC 5.3 4.3  HGB 11.4* 11.4*  HCT 35.0* 34.8*  PLT 505* 399   Recent Labs    06/30/22 1252 07/03/22 0538  NA 137 138  K 4.2 4.3  CL 102 102  CO2 24 25  GLUCOSE 99 91  BUN 9 10  CREATININE 0.73 0.66  CALCIUM 9.8 10.0    Intake/Output Summary (Last 24 hours) at 07/03/2022 1020 Last data filed at 07/03/2022 0900 Gross per 24 hour  Intake 415 ml  Output --  Net 415 ml        Physical Exam: Vital Signs Blood pressure 122/79, pulse 96, temperature 98.5 F (36.9 C), resp. rate 16, height 5\' 4"  (1.626 m), weight 53.2 kg, SpO2 100 %. Constitutional: sitting up in bed eating with NT at bedside, NAD HENT: No JVD. Neck Supple. Trachea midline. + R craniotomy site-minimal swelling, fluctuant, nontender- looks stable eyes: PERRLA. EOMI grossly intact , poor tolerance for right eye in right lateral gaze-improved from last assessment- less bruising under eyes- looks stable Cardiovascular: RRR Respiratory: CTA B/L no W/R/R Abdomen: soft, NT, ND, (+) hyperactive BS since eating GU: Not examined.   Skin: R craniectomy and R abdominal surgical sites - healing,  CDI.  Skin appears slightly dry on arms and legs. No rash noted + Bilateral periorbital hematoma, appears to be resolving, no appreciable edema.  No erythema, discharge, or warmth.  No exophthalmos.  MSK:     Tenderness L shoulder, pain with ROM      Strength:                RUE: 5/5 SA,  5/5 EF, 5/5 EE, 5/5 WE, 5/5 FF, 5/5 FA                 LUE:  2/5 FF, 1/5 FA, 1/5 EF, 1/5 EE - improving                RLE: 5/5 HF, 5/5 KE, 5/5 DF, 5/5 EHL, 5/5 PF                 LLE:  3/5 HF, 3/5 KE, in AFO  Neurologic exam:  Cognition: AAO to person, place, and time Language: Fluent, No substitutions or neoglisms. No dysarthria.  + Some mild impulsivity , disinhibition -ongoing  Insight: Poor insight into current condition.  Mood: Pleasant affect, appropriate mood.  Sensation: Altered sensation to light touch in left upper extremity and left lower extremity -no longer painful or hypersensitive  Reflexes: 2+ in R UE and LE; LUE and LLE hyporeflexic. Negative Hoffman's and babinski signs bilaterally.  CN: + L tongue deviation Coordination: No apparent tremors.  Spasticity: MAS  0 left finger flexors, left hip adductors -improved   Assessment/Plan: 1. Functional deficits which require 3+ hours per day of interdisciplinary therapy in a comprehensive inpatient rehab setting. Physiatrist is providing close team supervision and 24 hour management of active medical problems listed below. Physiatrist and rehab team continue to assess barriers to discharge/monitor patient progress toward functional and medical goals  Care Tool:  Bathing    Body parts bathed by patient: Left arm, Chest, Abdomen, Right upper leg   Body parts bathed by helper: Right arm, Front perineal area, Buttocks, Left upper leg, Right lower leg, Left lower leg, Face     Bathing assist Assist Level: Moderate Assistance - Patient 50 - 74%     Upper Body Dressing/Undressing Upper body dressing   What is the patient wearing?: Pull over shirt    Upper body assist Assist Level: Moderate Assistance - Patient 50 - 74%    Lower Body Dressing/Undressing Lower body dressing      What is the patient wearing?: Pants     Lower body assist Assist for lower body dressing: Maximal Assistance - Patient 25 - 49%      Toileting Toileting    Toileting assist Assist for toileting: Maximal Assistance - Patient 25 - 49%     Transfers Chair/bed transfer  Transfers assist  Chair/bed transfer activity did not occur: Safety/medical concerns  Chair/bed transfer assist level: Moderate Assistance - Patient 50 - 74%     Locomotion Ambulation   Ambulation assist      Assist level: 2 helpers Assistive device: Other (comment) (Hemi-rail) Max distance: 30'   Walk 10 feet activity   Assist     Assist level: 2 helpers     Walk 50 feet activity   Assist Walk 50 feet with 2 turns activity did not occur: Safety/medical concerns (Patient unable to ambulate >30' at this time secondary to fatigue, poor endurance/activity tolerance, impaired attention, increased pain, etc.)         Walk 150 feet activity   Assist Walk 150 feet activity did not occur: Safety/medical concerns         Walk 10 feet on uneven surface  activity   Assist Walk 10 feet on uneven surfaces activity did not occur: Safety/medical concerns         Wheelchair     Assist Is the patient using a wheelchair?: Yes Type of Wheelchair: Manual (per therapist, pt using TIS/Recline back wheelchair)    Wheelchair assist level: Dependent - Patient 0% Max wheelchair distance: 150'    Wheelchair 50 feet with 2 turns activity    Assist        Assist Level: Dependent - Patient 0%   Wheelchair 150 feet activity     Assist      Assist Level: Dependent - Patient 0%   Blood pressure 122/79, pulse 96, temperature 98.5 F (36.9 C), resp. rate 16, height 5\' 4"  (1.626 m), weight 53.2 kg, SpO2 100 %.  1. 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 Conchita Paris. Bone flap in abdominal pocket, plan for subacute flap replacement after IPR             -patient may  shower if cover  incisions             -ELOS/Goals: 6/5 goal DC date; supervision to min A   Safety precautions due to poor impulse control -  Helmet ordered for OOB 5/8; WHO and [facility] 5/9  Con't CIR PT, OT and SLP   - 5/14: Starting to advance L leg with some buckling, Min A goals seem appropriate. SLP attention, recall improving but family is triggering and she has behaviors when family comes in during therapies.  family meeting - 1 pm on Thursday  5/15: CT head showing improved edema, small SDH, and unchanged 8 mm midline shift 2 Antithrombotics: -DVT/anticoagulation:  Pharmaceutical: Lovenox initiated 07/04/2022             -antiplatelet therapy: N/A  3. Pain Management/neuropathic pain: Oxycodone as needed  5/8: added lidocaine patch x2 to L neck, shoulder  5/9: Add gabapentin 300 mg TID for LUE and LLE pain, likely related to spasticity and neuropathic.   5/10: Mild lethargy today but no use of PRN pain medications; decrease gabapentin to 100 mg TID  5/11: kpad and left shoulder sling ordered  5/23: Using oxycodone Q4H; continue  5/14: Using tylenol and oxycodone together consistently; schedule tylenol 1000 mg TID and oxycodone 5 mg TID; add oxycodone 5 mg BID Prn for breakthrough  -- Increase gabapentin to 200/200/400 mg daily  5/16: Increase gabapentin to 200/200/600 mg for pain and sleep  5/17 try voltaren gel L shoulder  5/18-5/19- denies pain this AM 4. Mood/Behavior/Sleep: PRN twice daily as needed provide emotional support             -antipsychotic agents: N/A  - Sleep log added - poor sleep 3 hours last night. Add melatonin 5 mg QHS + PRN trazodone 50-75 mg.   5/10: Slept 7 hours overnight, still endorsing poor sleep.  Did not use as needed trazodone, will add 50 mg standing nightly.  5/11: continue trazodone  5/13: Per log slept 5 hours overnight, however was not updated 5-10: Will recheck tomorrow  5.14: slept 7 hours overnight; see above for increased QHS gabapentin  5/15: s sleep log not  completed overnight, informed nursing.  Patient endorses ongoing poor sleep, no improvement with increase gabapentin.  5/16: interrupted sleep 7+ hours; increase trazodone to 75 mg and increase gabapentin as above  5/18- sleeping better 5. Neuropsych/cognition: This patient is not capable of making decisions on her own behalf -not consistently oriented, impulsive with poor awareness..   - Obs for safety  5/19- con't Telesitter and NT for meals 6. Skin/Wound Care: Routine skin checks  - 5/8: 3 weeks post-R craniectomy; ordered staple removal from scalp and abdomen. no apparent wounds will DC rectal tube    7. Dysphagia: placed order for Cortrak removal 5/12 Diet Orders (From admission, onward)     Start     Ordered   06/22/22 0845  DIET DYS 3 Room service appropriate? Yes with Assist; Fluid consistency: Thin; Fluid restriction: 1500 mL Fluid  Diet effective now       Comments: Meds whole with liquid; full supervision with meals  Question Answer Comment  Room service appropriate? Yes with Assist   Fluid consistency: Thin   Fluid restriction: 1500 mL Fluid      06/22/22 0844          5/13: Monitor POs with cortrak removal 5/18- eating well- wants grilled chicken and fries with tomato- agreed as long as cut up 8.  Hyponatremia.  Continue sodium chloride tablets.  Florinef 0.3 mg 3 times daily  - 5/8: Na 134 this AM. Downtrending over last 2 days. No obvious medication contributors; Already on salt tabs 2g TID. Placed 1500 ml fluid  restriction, repeat today 138. Ordered daily BMP x3 days for monitoring. Reduce to Florinef 0.1 mg TID and monitor.   5/9: Na stable 13. Diastolic BP improved, remains high systolic. Orthostatic vitals negative with BP. Given florinef indication is hyponatremia not orthostasis will continue 0.1 mg TID through Friday then DC if no significant drop in Na.   5-10: Na down to 135 with mildly increased lethargy.  Increase Florinef to 0.2 mg 3 times daily, continue  daily BMP for 3 days.  5/12: Na improved to 136, decrease Florinef to 0.1mg  TID given hypertension  5/13: NA 136 with decreased Florinef; reduced dosing to twice daily and recheck BMP Wednesday  5/14: DC florinef d/t persistent hypertension; labs pending  5/16: Na stable; will check today, then reduce Na starting tomorrow if appropriate with repeat labs Sunday  5/18- will recheck labs in AM/Sunday- ordered labs- will also reduce Na levels to 2G BID with meals from TID  5/19- Na 138, but reduction hasn't shown up yet, since got lunch NA yesterday- will recheck this week 10.  Decreased nutritional storage.  Currently on a dysphagia #3 diet.  NGT d/ced   - 5/14: DC SSI, CBG  11.  Urinary retention.  Urecholine 5 mg 3 times daily.  Check PVR - resolved  5/8: No PVRs overnight; ordered. Mixed continence overnight.   5/9: Continue UO and low PVRs. DC urecholine.   5/10: PVRs remain low, continent.  Continue for 24 hours, then DC PVRs.  5/19- PVRs off- resolved 12.  History of tobacco/alcohol use.  Counseling  13. Diarrhea. Remove rectal tube 5/8. Increase banatrol to TID.     - 5/9: No recorded Bms since rectal tube removal; reduce banatrol back to BID, obtain KUB  - 5/10: DC banatrol. KUB with mild stool burden.  If no BM today, will need to start laxatives.   5/11: magnesium citrate ordered  5/12: flowsheet reviewd and had type 5 stools today.   5/13: soft stools, last BM 5/12, montior  5/15: Liquid Bms 5/13; now on PO diet, monitor  5/16: add colace 100 mg BID + miralax daily PRN  5/17 schedule miralax  5/19- LBM x2 yesterday 14. HTN: magnesium citrate ordered, likely elevated due to Florinef, decrease to 1 tab TID    07/03/2022    3:58 AM 07/02/2022    7:48 PM 07/02/2022    1:20 PM  Vitals with BMI  Systolic 122   122 125 107  Diastolic 79   79 88 75  Pulse 96   96 89 87    - 5/13: PRN hydralazine used 1x overnight; BP remains significantly elevated with decreased Florinef.  Add  amlodipine 5 mg daily.  - 5/14: diastolic remains elevated; add lisinopril 5 mg daily. Will take 2-3 days for amlodipine effect. DC florinef - 5/15: Much improved;  DC lisinopril; Dc amlodipine this PM if remains normotensive 5/16: Diastolic remains elevated increase norvasc to 10 mg daily 5/17 controlled overall, continue current  5/18- 5/19- BP controlled- con't regimen 15. L eye pain/pressure: Getting MRI brain and orbits w/o contrast d/t worsening symptoms and poor EOMI tolerance; should have been improving with decreased postop edema   - 5/16: MRI converted to CT d/t clips likely to obscure relevant areas. Found "Minimal hazy asymmetric stranding within the right intraorbitalfat, suspected to reflect a degree of passive venous congestion due to the evolving process within the right cerebral hemisphere." No focal s/s cellulitis, but will get CBC today for eval. No management changes,  reassured patient and family.   16. Itching, generalized  -5/17 No rash noted, may be dry skin related, start moisturizer, continue benadryl crem      LOS: 12 days A FACE TO FACE EVALUATION WAS PERFORMED    07/03/2022, 10:20 AM

## 2022-07-04 ENCOUNTER — Ambulatory Visit (INDEPENDENT_AMBULATORY_CARE_PROVIDER_SITE_OTHER): Payer: 59 | Admitting: Primary Care

## 2022-07-04 LAB — CBC WITH DIFFERENTIAL/PLATELET
Abs Immature Granulocytes: 0.02 10*3/uL (ref 0.00–0.07)
Basophils Absolute: 0 10*3/uL (ref 0.0–0.1)
Basophils Relative: 1 %
Eosinophils Absolute: 0.1 10*3/uL (ref 0.0–0.5)
Eosinophils Relative: 3 %
HCT: 38.5 % (ref 36.0–46.0)
Hemoglobin: 12.8 g/dL (ref 12.0–15.0)
Immature Granulocytes: 1 %
Lymphocytes Relative: 41 %
Lymphs Abs: 1.6 10*3/uL (ref 0.7–4.0)
MCH: 30.6 pg (ref 26.0–34.0)
MCHC: 33.2 g/dL (ref 30.0–36.0)
MCV: 92.1 fL (ref 80.0–100.0)
Monocytes Absolute: 0.3 10*3/uL (ref 0.1–1.0)
Monocytes Relative: 7 %
Neutro Abs: 1.9 10*3/uL (ref 1.7–7.7)
Neutrophils Relative %: 47 %
Platelets: 359 10*3/uL (ref 150–400)
RBC: 4.18 MIL/uL (ref 3.87–5.11)
RDW: 14.2 % (ref 11.5–15.5)
WBC: 4.1 10*3/uL (ref 4.0–10.5)
nRBC: 0 % (ref 0.0–0.2)

## 2022-07-04 LAB — COMPREHENSIVE METABOLIC PANEL
ALT: 21 U/L (ref 0–44)
AST: 19 U/L (ref 15–41)
Albumin: 3.3 g/dL — ABNORMAL LOW (ref 3.5–5.0)
Alkaline Phosphatase: 52 U/L (ref 38–126)
Anion gap: 11 (ref 5–15)
BUN: 10 mg/dL (ref 6–20)
CO2: 24 mmol/L (ref 22–32)
Calcium: 10.1 mg/dL (ref 8.9–10.3)
Chloride: 99 mmol/L (ref 98–111)
Creatinine, Ser: 0.61 mg/dL (ref 0.44–1.00)
GFR, Estimated: >60 mL/min (ref >60)
Glucose, Bld: 86 mg/dL (ref 70–99)
Potassium: 4 mmol/L (ref 3.5–5.1)
Sodium: 134 mmol/L — ABNORMAL LOW (ref 135–145)
Total Bilirubin: 0.4 mg/dL (ref 0.3–1.2)
Total Protein: 7.9 g/dL (ref 6.5–8.1)

## 2022-07-04 MED ORDER — OXYCODONE HCL 5 MG PO TABS
5.0000 mg | ORAL_TABLET | Freq: Four times a day (QID) | ORAL | Status: DC | PRN
  Administered 2022-07-05 – 2022-07-10 (×11): 5 mg via ORAL
  Filled 2022-07-04 (×12): qty 1

## 2022-07-04 MED ORDER — BUTALBITAL-APAP-CAFFEINE 50-325-40 MG PO TABS
1.0000 | ORAL_TABLET | Freq: Four times a day (QID) | ORAL | Status: DC | PRN
  Administered 2022-07-04 (×2): 1 via ORAL
  Filled 2022-07-04 (×2): qty 1

## 2022-07-04 MED ORDER — SODIUM CHLORIDE 1 G PO TABS: 1.0000 g | ORAL_TABLET | Freq: Two times a day (BID) | ORAL | Status: DC

## 2022-07-04 MED ORDER — SODIUM CHLORIDE 1 G PO TABS: 1.0000 g | ORAL_TABLET | Freq: Three times a day (TID) | ORAL | Status: DC

## 2022-07-04 MED ORDER — SODIUM CHLORIDE 1 G PO TABS
2.0000 g | ORAL_TABLET | Freq: Three times a day (TID) | ORAL | Status: DC
  Administered 2022-07-04 – 2022-07-12 (×21): 2 g via ORAL
  Filled 2022-07-04 (×23): qty 2

## 2022-07-04 NOTE — Progress Notes (Signed)
Physical Therapy Session Note  Patient Details  Name: Kristin Simmons MRN: [id number] Date of Birth: Jan 28, 1986  Today's Date: 07/04/2022 PT Individual Time: 0800-0900 PT Individual Time Calculation (min): 60 min   Short Term Goals: Week 2:  PT Short Term Goal 1 (Week 2): Patient will complete bed/chair transfer with MinA PT Short Term Goal 2 (Week 2): Patient will ambulate x50' with LRAD and ModA x1 PT Short Term Goal 3 (Week 2): Patient will initiate stair training  Skilled Therapeutic Interventions/Progress Updates:  Patient greeted supine in bed and agreeable to PT treatment session. Patient transitioned from supine to sitting EOB with use of bed rail and CGA with VC for improved sequencing with good effort noted throughout. While sitting EOB, rehab tech and therapist donned tennis shoes and L anterior AFO for time management- VC for maintaining sitting balance throughout the donning of shoes with multiple LOBs to the Lt, however able to self-correct. Patient stood from EOB with MinA and R HHA- Patient then gait trained ~20' to/from the bathroom with R HHA and therapist managing Lt LE throughout stance and swing phase of gait. Patient toileted and performed pericare with MinA- While standing with R HR, patient required assistance for pulling pants over her hips on the Lt side. Patient wheeled to/from her room and day room for time management and energy conservation.   Patient gait trained x180', x90', x80' with R HHA and ModA overall- Therapist on a stool managing Lt LE throughout stance and swing phase of gait. Multimodal cues for improved Rt lateral weight shift in order to improve Lt swing phase of gait/overall foot clearance. Patient continues to require assistance for Lt TKE during stance due to significant buckling. Patient was able to advance Lt LE throughout swing phase with external visual cues for improved placement. Patient required extended seated rest break in between gait trials  secondary to fatigue.   Patient returned to her room and left reclined in TIS wheelchair with seat belt and posey belt on, call bell within reach and all needs met- RN notified and aware of patient positioning.    Therapy Documentation Precautions:  Precautions Precautions: Fall Precaution Comments: R craniectomy, no bone flap, bone flap placed in abdomen, R hand mitt, coretrak, flexiseal Restrictions Weight Bearing Restrictions: No  Pain: L shoulder pain and R eye pain- Patient reported improvement in symptoms of R eye with eye patch on and improvements of Lt shoulder pain with sling on or elevated on a pillow.   Therapy/Group: Individual Therapy     07/04/2022, 7:52 AM

## 2022-07-04 NOTE — Progress Notes (Signed)
Nutrition Follow-up  DOCUMENTATION CODES:   Not applicable  INTERVENTION:  - Continue Dys 3 diet.   NUTRITION DIAGNOSIS:   Inadequate oral intake related to poor appetite as evidenced by meal completion < 50%. - Improving.   GOAL:   Patient will meet greater than or equal to 90% of their needs - Met  MONITOR:   PO intake, TF tolerance  REASON FOR ASSESSMENT:   Consult Enteral/tube feeding initiation and management  ASSESSMENT:   37 y.o. female admits to CIR related to functional deficits in setting of s/p right craniectomy. PMH includes: asthma, cancer, HTN, cervical cancer.  Meds reviewed: Risaquad, colace, miralax, sodium chloride, folic acid, sliding scale insulin, thiamine. Labs reviewed: Na low.   Pt is oriented x2. Pt continues with good PO intakes. Pt has eaten 90-100% of her meals today. Pt is meeting her needs at this time. RD will continue to monitor PO intakes.   Diet Order:   Diet Order             DIET DYS 3 Room service appropriate? Yes with Assist; Fluid consistency: Thin; Fluid restriction: 1200 mL Fluid  Diet effective now                   EDUCATION NEEDS:   Not appropriate for education at this time  Skin:  Skin Assessment: Reviewed RN Assessment  Last BM:  5/18 - type 2  Height:   Ht Readings from Last 1 Encounters:  06/22/22 5\' 4"  (1.626 m)    Weight:   Wt Readings from Last 1 Encounters:  06/22/22 53.2 kg    Ideal Body Weight:     BMI:  Body mass index is 20.13 kg/m.  Estimated Nutritional Needs:   Kcal:  1800-2000  Protein:  85-100  Fluid:  > 1.8 L/day  Bethann Humble, RD, LDN, CNSC.

## 2022-07-04 NOTE — Progress Notes (Signed)
Occupational Therapy Session Note  Patient Details  Name: Kristin Simmons MRN: [id number] Date of Birth: 06/08/1985  Today's Date: 07/04/2022 OT Individual Time: 1130-1203 OT Individual Time Calculation (min): 33 min  and Today's Date: 07/04/2022 OT Missed Time: 27 Minutes Missed Time Reason: Patient fatigue   Short Term Goals: Week 2:  OT Short Term Goal 1 (Week 2): Patient will demonstrate improved awarenress of L hemi body by positioning L UE prior to transfer OT Short Term Goal 2 (Week 2): Patient will complete 1 step of LB dressing task OT Short Term Goal 3 (Week 2): Patient will dress L UE first with mod A  Skilled Therapeutic Interventions/Progress Updates:    Attempted to see patient for scheduled therapy time. Pt asleep and difficult to wake despite environmental changes and max cues.  OT returned in 30 minutes and was able to get pt to wake up by almost dependently sitting her EOB. Pt able to wake but still very sleepy during session. Total A to don shoes and L AFO. Min A stand-pivot to wc on the R. OT provided joint input through L wrist and hand for L U NMR with weight bearing. Some shoulder activation noted and pt able to initaite grasp with L hand. Addressed memory and L visual scanning with BITS activities. Correlating words and images in increasing order, then having to recall the words in order. Pt able to get up to 5 words with min cues to scan L. OT provided hand over hand to integrate L UE to push dots on L side of screen. Improved attention to L with repetition. Pt returned to room in TIS wc. OT placed SAEBO e-stim. SAEBO left on for 60 minutes. OT returned to remove SAEBO with skin intact and no adverse reactions.  Sa[place]o Stim One 330 pulse width 35 Hz pulse rate On 8 sec/ off 8 sec Ramp up/ down 2 sec Symmetrical Biphasic wave form  Max intensity at 500 Ohm load  Pt left with alarm belt on, call bell in reach, and needs met.      Therapy  Documentation Precautions:  Precautions Precautions: Fall Precaution Comments: R craniectomy, no bone flap, bone flap placed in abdomen, R hand mitt, coretrak, flexiseal Restrictions Weight Bearing Restrictions: No General: General OT Amount of Missed Time: 27 Minutes Pain:  Pt reports headache, no number given, rest and repositioned    Therapy/Group: Individual Therapy  Mal Amabile 07/04/2022, 12:20 PM

## 2022-07-04 NOTE — Progress Notes (Signed)
Speech Language Pathology Daily Session Note  Patient Details  Name: Kristin Simmons MRN: [id number] Date of Birth: 06-Sep-1985  Today's Date: 07/04/2022 SLP Individual Time: 1400-1500 SLP Individual Time Calculation (min): 60 min  Short Term Goals: Week 2: SLP Short Term Goal 1 (Week 2): Patient will consume current diet with minimal overt s/s of aspiration with supervision supervision level verbal cues for use of swallowing compensatory strategies. SLP Short Term Goal 2 (Week 2): Patient will demonstrate sustained attention to functional tasks for 30 minutes with Mod verbal cues for redirection. SLP Short Term Goal 3 (Week 2): Patient will attend to left visual field during functional tasks with Min A multimodal cues. SLP Short Term Goal 4 (Week 2): Patient will demonstrate functional problem solving for mildly complex tasks with Min verbal cues. SLP Short Term Goal 5 (Week 2): Patient will self-monitor and correct errors during functional tasks with Min verbal and visual cues. SLP Short Term Goal 6 (Week 2): Patient will recall new, daily information with Mod multimodal cues.  Skilled Therapeutic Interventions: Skilled treatment session focused on cognitive goals. Upon arrival, patient was asleep in bed but easily awakened. Patient requested to use the bathroom and transferred to the commode with +1 via the [facility]. Patient with less impulsivity and was able to independently donn her helmet and assist with donning her sling for her left arm. Patient was continent of bladder. SLP also facilitated session by providing Max A verbal cues for recall of her current medications and their function with Mod verbal cues needed for problem solving. Patient demonstrated sustained attention to task for ~30 minutes with Min verbal cues for redirection. Patient left upright in bed with alarm on and all needs within reach. Continue with current plan of care.      Pain 10/10 pain in left shoulder/back Nursing  aware and administered medications   Therapy/Group: Individual Therapy  ,  07/04/2022, 3:35 PM

## 2022-07-04 NOTE — Progress Notes (Signed)
PROGRESS NOTE   Subjective/Complaints: No events overnight.  Patient complains of ongoing pain with pressure behind her right eye, along with right-sided headaches, pain over her right surgical incision site, and sensory pain in her left arm and leg.  She states that none of these were improved with current medication mother measures, remains severe.  She endorses to sleeping "okay", per sleep log greater than 7 hours per night with some mild interruptions.  Sodium mildly low, 134 with salt tab weaning.  ROS:  + R eye pressure/pain -ongoing + R shoulder/neck pain/arm pain-ongoing + Lethargy/insomnia -improved + Abdominal itching -ongoing + generalized itching  Denies fevers, chills, N/V, abdominal pain, constipation, diarrhea, SOB, cough, chest pain, new weakness or paraesthesias.     Objective:   No results found. Recent Labs    07/03/22 0538  WBC 4.3  HGB 11.4*  HCT 34.8*  PLT 399    Recent Labs    07/03/22 0538  NA 138  K 4.3  CL 102  CO2 25  GLUCOSE 91  BUN 10  CREATININE 0.66  CALCIUM 10.0     Intake/Output Summary (Last 24 hours) at 07/04/2022 0823 Last data filed at 07/04/2022 0804 Gross per 24 hour  Intake 356 ml  Output --  Net 356 ml         Physical Exam: Vital Signs Blood pressure (!) 131/94, pulse 91, temperature 98.2 F (36.8 C), temperature source Oral, resp. rate 18, height 5\' 4"  (1.626 m), weight 53.2 kg, SpO2 99 %. Constitutional: No acute distress, keeps right eye preferentially covered, sitting in wheelchair in bedroom. HENT: No JVD. Neck Supple. Trachea midline. + R craniotomy site-minimal swelling -resolving eyes: PERRLA. EOMI grossly intact , poor tolerance for right eye in right>left lateral gaze-somewhat worse from prior exam  Cardiovascular: RRR.  No murmurs, rubs, gallops. Respiratory: CTA B/L no W/R/R Abdomen: soft, NT, ND, (+) hyperactive BS since eating GU: Not  examined.   Skin: R craniectomy and R abdominal surgical sites - healing,  CDI.   + Bilateral periorbital hematoma, appears to be resolving  MSK:     Tenderness L shoulder, pain with ROM      Strength:                RUE: 5/5 SA, 5/5 EF, 5/5 EE, 5/5 WE, 5/5 FF, 5/5 FA                 LUE:  2/5 FF, 1/5 FA, 1/5 EF, 1/5 EE -unchanged                RLE: 5/5 HF, 5/5 KE, 5/5 DF, 5/5 EHL, 5/5 PF                 LLE:  3/5 HF, 3/5 KE, in AFO  Neurologic exam:  Cognition: AAO to person, place, month and year; states today is Thursday Language: Fluent, No substitutions or neoglisms. No dysarthria.  + Some mild impulsivity , disinhibition -ongoing  Insight: Poor insight into current condition.  Mood: Pleasant affect, appropriate mood.  Somewhat labile, perseverates on pain Sensation: Altered sensation to light touch in left upper extremity and left lower extremity -no  longer painful or hypersensitive-improving  Reflexes: 2+ in R UE and LE; LUE and LLE hyporeflexic. Negative Hoffman's and babinski signs bilaterally.  CN: + L tongue deviation Coordination: No apparent tremors.  Spasticity: MAS 0 left finger flexors, left hip adductors -improved   Assessment/Plan: 1. Functional deficits which require 3+ hours per day of interdisciplinary therapy in a comprehensive inpatient rehab setting. Physiatrist is providing close team supervision and 24 hour management of active medical problems listed below. Physiatrist and rehab team continue to assess barriers to discharge/monitor patient progress toward functional and medical goals  Care Tool:  Bathing    Body parts bathed by patient: Left arm, Chest, Abdomen, Right upper leg   Body parts bathed by helper: Right arm, Front perineal area, Buttocks, Left upper leg, Right lower leg, Left lower leg, Face     Bathing assist Assist Level: Moderate Assistance - Patient 50 - 74%     Upper Body Dressing/Undressing Upper body dressing   What is the  patient wearing?: Pull over shirt    Upper body assist Assist Level: Moderate Assistance - Patient 50 - 74%    Lower Body Dressing/Undressing Lower body dressing      What is the patient wearing?: Pants     Lower body assist Assist for lower body dressing: Maximal Assistance - Patient 25 - 49%     Toileting Toileting    Toileting assist Assist for toileting: Maximal Assistance - Patient 25 - 49%     Transfers Chair/bed transfer  Transfers assist  Chair/bed transfer activity did not occur: Safety/medical concerns  Chair/bed transfer assist level: Moderate Assistance - Patient 50 - 74%     Locomotion Ambulation   Ambulation assist      Assist level: Maximal Assistance - Patient 25 - 49% Assistive device: Orthosis Max distance: 50   Walk 10 feet activity   Assist     Assist level: Maximal Assistance - Patient 25 - 49% Assistive device: Orthosis, Walker-hemi   Walk 50 feet activity   Assist Walk 50 feet with 2 turns activity did not occur: Safety/medical concerns (Patient unable to ambulate >30' at this time secondary to fatigue, poor endurance/activity tolerance, impaired attention, increased pain, etc.)  Assist level: Maximal Assistance - Patient 25 - 49% Assistive device: Walker-hemi    Walk 150 feet activity   Assist Walk 150 feet activity did not occur: Safety/medical concerns         Walk 10 feet on uneven surface  activity   Assist Walk 10 feet on uneven surfaces activity did not occur: Safety/medical concerns         Wheelchair     Assist Is the patient using a wheelchair?: Yes Type of Wheelchair: Manual (per therapist, pt using TIS/Recline back wheelchair)    Wheelchair assist level: Dependent - Patient 0% Max wheelchair distance: 150'    Wheelchair 50 feet with 2 turns activity    Assist        Assist Level: Dependent - Patient 0%   Wheelchair 150 feet activity     Assist      Assist Level: Dependent  - Patient 0%   Blood pressure (!) 131/94, pulse 91, temperature 98.2 F (36.8 C), temperature source Oral, resp. rate 18, height 5\' 4"  (1.626 m), weight 53.2 kg, SpO2 99 %.  1. 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 Conchita Paris. Bone flap in abdominal  pocket, plan for subacute flap replacement after IPR             -patient may  shower if cover incisions             -ELOS/Goals: 6/5 goal DC date; supervision to min A   Safety precautions due to poor impulse control - Helmet ordered for OOB 5/8; WHO and [facility] 5/9  Con't CIR PT, OT and SLP   - 5/14: Starting to advance L leg with some buckling, Min A goals seem appropriate. SLP attention, recall improving but family is triggering and she has behaviors when family comes in during therapies.   5/15: CT head showing improved edema, small SDH, and unchanged 8 mm midline shift  2 Antithrombotics: -DVT/anticoagulation:  Pharmaceutical: Lovenox initiated 07/04/2022             -antiplatelet therapy: N/A  3. Pain Management/neuropathic pain: Oxycodone as needed  5/8: added lidocaine patch x2 to L neck, shoulder  5/9: Add gabapentin 300 mg TID for LUE and LLE pain, likely related to spasticity and neuropathic.   5/10: Mild lethargy today but no use of PRN pain medications; decrease gabapentin to 100 mg TID  5/11: kpad and left shoulder sling ordered  5/23: Using oxycodone Q4H; continue  5/14: Using tylenol and oxycodone together consistently; schedule tylenol 1000 mg TID and oxycodone 5 mg TID; add oxycodone 5 mg BID Prn for breakthrough  -- Increase gabapentin to 200/200/400 mg daily  5/16: Increase gabapentin to 200/200/600 mg for pain and sleep  5/17 try voltaren gel L shoulder  5/18-5/19- denies pain this AM  5/20: Due to no improvement with prior measures, discontinue daytime gabapentin, standing Tylenol, and  standing oxycodone.  Changed headache regimen to Fioricet 1 tab 4 times daily as needed, and oxycodone 5 mg 4 times daily as needed for pain.  4. Mood/Behavior/Sleep: PRN twice daily as needed provide emotional support             -antipsychotic agents: N/A  - Sleep log added - poor sleep 3 hours last night. Add melatonin 5 mg QHS + PRN trazodone 50-75 mg.   5/10: Slept 7 hours overnight, still endorsing poor sleep.  Did not use as needed trazodone, will add 50 mg standing nightly.  5/11: continue trazodone  5/13: Per log slept 5 hours overnight, however was not updated 5-10: Will recheck tomorrow  5.14: slept 7 hours overnight; see above for increased QHS gabapentin  5/15: s sleep log not completed overnight, informed nursing.  Patient endorses ongoing poor sleep, no improvement with increase gabapentin.  5/16: interrupted sleep 7+ hours; increase trazodone to 75 mg and increase gabapentin as above  5/18-20 sleeping better  5. Neuropsych/cognition: This patient is not capable of making decisions on her own behalf -not consistently oriented, impulsive with poor awareness..   - Obs for safety  5/19- con't Telesitter and NT for meals  6. Skin/Wound Care: Routine skin checks  - 5/8: 3 weeks post-R craniectomy; ordered staple removal from scalp and abdomen. no apparent wounds will DC rectal tube    7. Dysphagia: placed order for Cortrak removal 5/12 Diet Orders (From admission, onward)     Start     Ordered   06/22/22 0845  DIET DYS 3 Room service appropriate? Yes with Assist; Fluid consistency: Thin; Fluid restriction: 1500 mL Fluid  Diet effective now       Comments: Meds whole with liquid; full supervision with meals  Question Answer Comment  Room  service appropriate? Yes with Assist   Fluid consistency: Thin   Fluid restriction: 1500 mL Fluid      06/22/22 0844          5/13: Monitor POs with cortrak removal 5/18- eating well- wants grilled chicken and fries with tomato- agreed  as long as cut up  8.  Hyponatremia.  Continue sodium chloride tablets.  Florinef 0.3 mg 3 times daily  - 5/8: Na 134 this AM. Downtrending over last 2 days. No obvious medication contributors; Already on salt tabs 2g TID. Placed 1500 ml fluid restriction, repeat today 138. Ordered daily BMP x3 days for monitoring. Reduce to Florinef 0.1 mg TID and monitor.   5/9: Na stable 13. Diastolic BP improved, remains high systolic. Orthostatic vitals negative with BP. Given florinef indication is hyponatremia not orthostasis will continue 0.1 mg TID through Friday then DC if no significant drop in Na.   5-10: Na down to 135 with mildly increased lethargy.  Increase Florinef to 0.2 mg 3 times daily, continue daily BMP for 3 days.  5/12: Na improved to 136, decrease Florinef to 0.1mg  TID given hypertension  5/13: NA 136 with decreased Florinef; reduced dosing to twice daily and recheck BMP Wednesday  5/14: DC florinef d/t persistent hypertension; labs pending  5/16: Na stable; will check today, then reduce Na starting tomorrow if appropriate with repeat labs Sunday  5/18- will recheck labs in AM/Sunday- ordered labs- will also reduce Na levels to 2G BID with meals from TID  5/19- Na 138, but reduction hasn't shown up yet, since got lunch NA yesterday- will recheck this week 5/20: NA low 134 with reduction, resume 2 g 3 times daily dosing and recheck on Wednesday  10.  Decreased nutritional storage.  Currently on a dysphagia #3 diet.  NGT d/ced   - 5/14: DC SSI, CBG  11.  Urinary retention.  Urecholine 5 mg 3 times daily.  Check PVR - resolved  5/8: No PVRs overnight; ordered. Mixed continence overnight.   5/9: Continue UO and low PVRs. DC urecholine.   5/10: PVRs remain low, continent.  Continue for 24 hours, then DC PVRs.  5/19- PVRs off- resolved  12.  History of tobacco/alcohol use.  Counseling  13. Diarrhea. Remove rectal tube 5/8. Increase banatrol to TID.     - 5/9: No recorded Bms since rectal  tube removal; reduce banatrol back to BID, obtain KUB  - 5/10: DC banatrol. KUB with mild stool burden.  If no BM today, will need to start laxatives.   5/11: magnesium citrate ordered  5/12: flowsheet reviewd and had type 5 stools today.   5/13: soft stools, last BM 5/12, montior  5/15: Liquid Bms 5/13; now on PO diet, monitor  5/16: add colace 100 mg BID + miralax daily PRN  5/17 schedule miralax  5/19- LBM x2 yesterday  14. HTN: magnesium citrate ordered, likely elevated due to Florinef, decrease to 1 tab TID    07/04/2022    4:40 AM 07/03/2022    7:24 PM 07/03/2022    2:57 PM  Vitals with BMI  Systolic 131 124 161  Diastolic 94 80 74  Pulse 91 85 85    - 5/13: PRN hydralazine used 1x overnight; BP remains significantly elevated with decreased Florinef.  Add amlodipine 5 mg daily.  - 5/14: diastolic remains elevated; add lisinopril 5 mg daily. Will take 2-3 days for amlodipine effect. DC florinef - 5/15: Much improved;  DC lisinopril; Dc amlodipine this PM  if remains normotensive 5/16: Diastolic remains elevated increase norvasc to 10 mg daily 5/17 controlled overall, continue current  5/18- 5/20- BP controlled- con't regimen  15. L eye pain/pressure: Getting MRI brain and orbits w/o contrast d/t worsening symptoms and poor EOMI tolerance; should have been improving with decreased postop edema   - 5/16: MRI converted to CT d/t clips likely to obscure relevant areas. Found "Minimal hazy asymmetric stranding within the right intraorbitalfat, suspected to reflect a degree of passive venous congestion due to the evolving process within the right cerebral hemisphere." No focal s/s cellulitis, but will get CBC today for eval. No management changes, reassured patient and family.   5-20: Ongoing, Fioricet as above.  Labs appear appropriate, no concern for infection.  16. Itching, generalized  -5/17 No rash noted, may be dry skin related, start moisturizer, continue benadryl crem       LOS: 13 days A FACE TO FACE EVALUATION WAS PERFORMED  Angelina Sheriff 07/04/2022, 8:23 AM

## 2022-07-04 NOTE — Progress Notes (Signed)
Physical Therapy Session Note  Patient Details  Name: Kristin Simmons MRN: [id number] Date of Birth: 05/27/1985  Today's Date: 07/04/2022 PT Individual Time: 1300-1325 PT Individual Time Calculation (min): 25 min   Short Term Goals: Week 2:  PT Short Term Goal 1 (Week 2): Patient will complete bed/chair transfer with MinA PT Short Term Goal 2 (Week 2): Patient will ambulate x50' with LRAD and ModA x1 PT Short Term Goal 3 (Week 2): Patient will initiate stair training  Skilled Therapeutic Interventions/Progress Updates:       Pt sitting in TIS w/c to start - reports no pain but generalized fatigue - requesting to get back to bed at end of session to rest.   Transported in w/c to day room rehab gym. Stand step transfer with min/modA from TIS w/c to mat table. Fair sitting balance at EOM without LOB. Challenged dynamic standing balance, turns, and gait by playing a game of "PIG" in basketball. ModA for short distance ambulation in rehab gym to designated areas to shoot the basketball. MinA for standing balance while shooting. No UE support throughout tasks however patient reaching for PT arm to stabilize herself. Rest breaks provided as needed. Patient able to advance paretic LLE with min assist during task but needs monitoring to reduce risk of ankle inversion.   Returned to her room and assisted to bed with stand step transfer. ModA for bed mobility. Alarm on and call bell in lap.    Therapy Documentation Precautions:  Precautions Precautions: Fall Precaution Comments: R craniectomy, no bone flap, bone flap placed in abdomen, R hand mitt, coretrak, flexiseal Restrictions Weight Bearing Restrictions: No General:      Therapy/Group: Individual Therapy  Orrin Brigham 07/04/2022, 7:49 AM

## 2022-07-05 DIAGNOSIS — G3184 Mild cognitive impairment, so stated: Secondary | ICD-10-CM

## 2022-07-05 LAB — BASIC METABOLIC PANEL
Anion gap: 12 (ref 5–15)
BUN: 12 mg/dL (ref 6–20)
CO2: 23 mmol/L (ref 22–32)
Calcium: 10.1 mg/dL (ref 8.9–10.3)
Chloride: 102 mmol/L (ref 98–111)
Creatinine, Ser: 0.75 mg/dL (ref 0.44–1.00)
GFR, Estimated: >60 mL/min (ref >60)
Glucose, Bld: 90 mg/dL (ref 70–99)
Potassium: 4.2 mmol/L (ref 3.5–5.1)
Sodium: 137 mmol/L (ref 135–145)

## 2022-07-05 LAB — CBC WITH DIFFERENTIAL/PLATELET
Abs Immature Granulocytes: 0.01 10*3/uL (ref 0.00–0.07)
Basophils Absolute: 0 10*3/uL (ref 0.0–0.1)
Basophils Relative: 1 %
Eosinophils Absolute: 0.1 10*3/uL (ref 0.0–0.5)
Eosinophils Relative: 3 %
HCT: 42 % (ref 36.0–46.0)
Hemoglobin: 13.8 g/dL (ref 12.0–15.0)
Immature Granulocytes: 0 %
Lymphocytes Relative: 46 %
Lymphs Abs: 2 10*3/uL (ref 0.7–4.0)
MCH: 30.7 pg (ref 26.0–34.0)
MCHC: 32.9 g/dL (ref 30.0–36.0)
MCV: 93.3 fL (ref 80.0–100.0)
Monocytes Absolute: 0.3 10*3/uL (ref 0.1–1.0)
Monocytes Relative: 7 %
Neutro Abs: 1.9 10*3/uL (ref 1.7–7.7)
Neutrophils Relative %: 43 %
Platelets: 258 10*3/uL (ref 150–400)
RBC: 4.5 MIL/uL (ref 3.87–5.11)
RDW: 14.4 % (ref 11.5–15.5)
WBC: 4.3 10*3/uL (ref 4.0–10.5)
nRBC: 0 % (ref 0.0–0.2)

## 2022-07-05 MED ORDER — GABAPENTIN 100 MG PO CAPS
200.0000 mg | ORAL_CAPSULE | Freq: Three times a day (TID) | ORAL | Status: DC
  Administered 2022-07-05 – 2022-07-06 (×5): 200 mg via ORAL
  Filled 2022-07-05 (×5): qty 2

## 2022-07-05 MED ORDER — ACETAMINOPHEN 500 MG PO TABS
1000.0000 mg | ORAL_TABLET | Freq: Three times a day (TID) | ORAL | Status: DC
  Administered 2022-07-05 – 2022-07-14 (×28): 1000 mg via ORAL
  Filled 2022-07-05 (×28): qty 2

## 2022-07-05 NOTE — Consult Note (Signed)
Neuropsychological Consultation Comprehensive Inpatient Rehab   Patient:   Kristin Simmons   DOB:   04-Oct-1985  MR Number:  [id number]  Location:  [facility] [address] CENTER A [id number] [place] [address] Dept: [phone] Loc: [phone]           Date of Service:   07/05/2022  Start Time:   3 PM End Time:   4 PM  Provider/Observer:  Arley Phenix, Psy.D.       Clinical Neuropsychologist       Billing Code/Service: [phone]  Reason for Service:    Kristin Simmons is a 37 year old female referred for neuropsychological consultation due to ongoing cognitive difficulties and changes in mood and affect secondary to recent cerebrovascular event and current admission onto the comprehensive inpatient rehabilitation program.  Patient has a past medical history including hypertension as well as alcohol and tobacco use.  Patient had a recent trip to the emergency department due to some dental pain.  As part of her workup in the emergency department she underwent maxillofacial CT with contrast which incidentally discovered a right carotid aneurysm and patient was referred to neurosurgery.  Patient presented on 05/31/2022 for elective right craniotomy for clipping of middle cerebral artery aneurysm, placement of bone flap in subcutaneous abdominal pocket.  Hospital course included patient suffering intraoperative rupture requiring mechanical ventilation with follow-up per critical care medicine.  Patient developed MCA territory edema with hemorrhage transformation.  On 4/29 she experienced a change in mental status and showed evidence of severe vasospasm.  CT angiogram showed evolving large right MCA territory infarction with herniation of tissue off of right craniotomy more pronounced than on prior CT.  There were new areas of hypodensity in the right parietal lobe consistent with recent infarct as well as rightward midline  shift.  Patient was ultimately transferred back to ICU for hemodynamic augmentation and maintenance.  Patient was ultimately slowly extubated and has current restrictions for food intake.  Patient had therapy evaluations completed and patient was admitted onto the inpatient comprehensive rehabilitation unit due to decreased functional mobility and ongoing cognitive deficits.  During the clinical intervention today the patient was laying on her side appearing to be resting as I entered the room.  Patient was very slow to orient and was very lethargic and inattentive throughout.  Patient had great difficulty describing what it happened to her recent medically but was aware that she had "a stroke."  Patient demonstrated a very poor understanding of what was going on around her and understanding of what she has been doing in therapy.  Patient remained very lethargic and asked very limited questions when given multiple opportunities.  The patient described left shoulder pain reporting that she was unsure of why she was having shoulder pain, abdominal pain around her subcutaneous abdominal pocket and repeated questions about when she will be able to go home.  Patient had little awareness about the complexities of her medical status and what it happened to her medically.  She showed memory deficits for recall of information that has likely been conveyed to her on multiple occasions.  Review of other therapy notes for the patient during her time on the unit indicated similar types of cognitive and behavioral patterns.  The deficits appear to be consistent with right parietal lobe dysfunction as well as potential involvement of temporal lobe and even frontal lobe involvement to some degree.  In any event, the primary deficits are  very consistent with right parietal lobe dysfunction.  HPI for the current admission:    HPI: Kristin Simmons is a 37 year old right-handed female with history of hypertension as well as  tobacco/alcohol use. Patient with recent trip to the emergency department for some dental pain. As part of her workup in the emergency department she underwent maxillofacial CT with contrast which incidentally discovered a right carotid aneurysm. She was referred to neurosurgery. Presented 05/31/2022 for elective right craniectomy for clipping of middle cerebral artery aneurysm placement of bone flap and subcutaneous abdominal pocket 05/31/2022 per Dr. Conchita Paris. Hospital course patient experienced intraoperative rupture requiring mechanical ventilation with follow-up per critical care medicine. Developed MCA territory edema with hemorrhagic transformation. On 4/29 she experienced a change in mental status showed severe evidence of severe vasospasm. CT angiogram showed evolving large right MCA territory infarction with herniation of tissue off of the right craniectomy more pronounced on prior CT. New area of hypodensity in the right parietal lobe consistent with recent infarct as well as rightward midline shift. She was transferred back to the ICU for hemodynamic augmentation and maintained on 3% NaCl.. Developed hyponatremia 123 as well as treated for haemophilus pneumonia and placed on sodium chloride tablets with latest sodium 135. Responded to normal saline . Bouts of hypokalemia with supplement added. Echocardiogram with ejection fraction of 70 to 75% grade 1 diastolic dysfunction. Patient was slowly extubated. Currently on a mechanical soft thin liquid diet as well as nasogastric tube feeds for nutritional support.. She was cleared to begin Lovenox for DVT prophylaxis 06/19/2022. Bouts of urinary retention placed on low-dose Urecholine. Blood pressure remains soft placed on Florinef. Therapy evaluations completed due to patient's decreased functional mobility was admitted for a comprehensive rehab program.   Medical History:   Past Medical History:  Diagnosis Date   Anemia    Anxiety    Arthritis    Asthma     Cancer (HCC)    cervical   Depression    Headache    Hypertension    She was taking her mother's blood pressure meds but states she doesn't need bp meds now.   Vaginal Pap smear, abnormal          Patient Active Problem List   Diagnosis Date Noted   Mild cognitive impairment with memory loss 07/05/2022   Right internal carotid artery aneurysm 06/21/2022   Brain bleed (HCC) 06/02/2022   Status post craniotomy 05/31/2022   SAH (subarachnoid hemorrhage) (HCC) 05/31/2022   Cerebral aneurysm 03/08/2022   Aneurysm, cerebral, nonruptured 03/08/2022   Other spondylosis with radiculopathy, cervical region 04/15/2021   PROM (premature rupture of membranes) 03/26/2018   Postpartum hypertension 03/26/2018   Trichomoniasis 12/05/2017   High risk social situation  12/05/2017   Supervision of normal pregnancy 10/19/2017   History of cocaine use 10/19/2017   Adjustment disorder with depressed mood 09/13/2017   Anxiety disorder, unspecified 09/12/2017   Abnormal Pap smear of cervix 01/18/2016   Smoker 01/13/2016   Depression 01/13/2016   Alcohol use affecting pregnancy, first trimester 01/13/2016   History of preterm delivery, currently pregnant 01/13/2016   Underweight 01/13/2016    Behavioral Observation/Mental Status:   Kristin Simmons  presents as a 37 y.o.-year-old Right handed African American Female who appeared her stated age. her dress was Appropriate and she was Fairly Groomed and her manners were Appropriate, inappropriate to the situation.  her participation was indicative of Inattentive behaviors.  There were physical disabilities noted.  she displayed an inappropriate  level of cooperation and motivation.    Interactions:    Minimal Drowsy and Inattentive  Attention:   abnormal and attention span appeared shorter than expected for age  Memory:   abnormal; global memory impairment noted  Visuo-spatial:   abnormal  Speech (Volume):  low  Speech:   slurred;   Thought  Process:  Circumstantial, Tangential, and Disorganized  Concrete, Circumstantial, and Focused  Though Content:  WNL; not suicidal and not homicidal  Orientation:   person and place  Judgment:   Poor  Planning:   Poor  Affect:    Flat and Lethargic  Mood:    Dysphoric  Insight:   Shallow  Intelligence:   normal  Psychiatric History:  Patient does have a past history of previous diagnosis of depression and anxiety and review of past medication history she was prescribed Zoloft around 2020 for period of time.  Patient also has been prescribed Ambien for sleep back in 2018 and continues taking trazodone now for sleep on the unit.  Patient denies any depression during the clinical visit today her mood and affect was very flat but the determination of how much of that is due to her neurological status versus depressive reaction is difficult to ascertain.  Family Med/Psych History:  Family History  Problem Relation Age of Onset   Alzheimer's disease Paternal Grandmother    Cancer Maternal Grandmother    Cancer Maternal Grandfather    Hypertension Father    Anemia Mother    Hypertension Mother    Thyroid disease Mother    Diabetes Sister    Hypertension Sister    Mental illness Brother    Asthma Daughter    Bronchitis Daughter    Asthma Daughter    Bronchitis Daughter    Asthma Daughter    Bronchitis Daughter     Impression/DX:   Kristin Simmons is a 37 year old female referred for neuropsychological consultation due to ongoing cognitive difficulties and changes in mood and affect secondary to recent cerebrovascular event and current admission onto the comprehensive inpatient rehabilitation program.  Patient has a past medical history including hypertension as well as alcohol and tobacco use.  Patient had a recent trip to the emergency department due to some dental pain.  As part of her workup in the emergency department she underwent maxillofacial CT with contrast which  incidentally discovered a right carotid aneurysm and patient was referred to neurosurgery.  Patient presented on 05/31/2022 for elective right craniotomy for clipping of middle cerebral artery aneurysm, placement of bone flap in subcutaneous abdominal pocket.  Hospital course included patient suffering intraoperative rupture requiring mechanical ventilation with follow-up per critical care medicine.  Patient developed MCA territory edema with hemorrhage transformation.  On 4/29 she experienced a change in mental status and showed evidence of severe vasospasm.  CT angiogram showed evolving large right MCA territory infarction with herniation of tissue off of right craniotomy more pronounced than on prior CT.  There were new areas of hypodensity in the right parietal lobe consistent with recent infarct as well as rightward midline shift.  Patient was ultimately transferred back to ICU for hemodynamic augmentation and maintenance.  Patient was ultimately slowly extubated and has current restrictions for food intake.  Patient had therapy evaluations completed and patient was admitted onto the inpatient comprehensive rehabilitation unit due to decreased functional mobility and ongoing cognitive deficits.  During the clinical intervention today the patient was laying on her side appearing to be resting as I entered the room.  Patient was very slow to orient and was very lethargic and inattentive throughout.  Patient had great difficulty describing what it happened to her recent medically but was aware that she had "a stroke."  Patient demonstrated a very poor understanding of what was going on around her and understanding of what she has been doing in therapy.  Patient remained very lethargic and asked very limited questions when given multiple opportunities.  The patient described left shoulder pain reporting that she was unsure of why she was having shoulder pain, abdominal pain around her subcutaneous abdominal pocket  and repeated questions about when she will be able to go home.  Patient had little awareness about the complexities of her medical status and what it happened to her medically.  She showed memory deficits for recall of information that has likely been conveyed to her on multiple occasions.  Review of other therapy notes for the patient during her time on the unit indicated similar types of cognitive and behavioral patterns.  The deficits appear to be consistent with right parietal lobe dysfunction as well as potential involvement of temporal lobe and even frontal lobe involvement to some degree.  In any event, the primary deficits are very consistent with right parietal lobe dysfunction.  Disposition/Plan:  Today there was an attempt to review her cognitive status and the patient is clearly having significant cognitive deficits involving both executive functioning, global understanding and comprehension and memory functions.  Attention is impaired with significant slowed focus execute/information processing deficits, deficits in more deep and comprehensive understanding of her current status and some perseverative types of verbal patterns.  The patient's comprehension deficits make going into specific therapeutic efforts around helping the patient manage and cope better compromised.          Electronically Signed   _______________________ Arley Phenix, Psy.D. Clinical Neuropsychologist

## 2022-07-05 NOTE — Progress Notes (Signed)
Physical Therapy Session Note  Patient Details  Name: Kristin Simmons MRN: [id number] Date of Birth: March 27, 1985  Today's Date: 07/05/2022 PT Individual Time: 0800-0915 PT Individual Time Calculation (min): 75 min   Short Term Goals: Week 2:  PT Short Term Goal 1 (Week 2): Patient will complete bed/chair transfer with MinA PT Short Term Goal 2 (Week 2): Patient will ambulate x50' with LRAD and ModA x1 PT Short Term Goal 3 (Week 2): Patient will initiate stair training  Skilled Therapeutic Interventions/Progress Updates:  Patient greeted supine in bed and agreeable to PT treatment session. Patient transitioned from supine to sitting EOB with use of R UE and HR with CGA and VC for improved R lateral lean. Patient required MinA for initial dynamic sitting balance secondary to L lateral lean. While sitting EOB, patient doffed shirt with ModA and donned clean shirt with Min/ModA. Patient gait trained to/from the bathroom with R HHA from rehab tech and therapist managing L LE throughout stance phase to prevent buckling. Patient with continent void of urine and was able to perform pericare with CGA for improved sitting balance. While seated, therapist threaded pants/underwear and donned tennis shoes and L anterior AFO for time management. Patient wheeled to rehab gym for time management.   Patient ascended/descended x8 steps with R HR and ModA for L LE management with rehab tech present for safety. Multimodal cues for improved sequencing and placement of L LE throughout stair mobility. Patient ascended leading with R LE and descended leading with L LE all while using a step-to pattern.   Patient gait trained x110', 2 x 50' with R HHA from rehab tech and therapist on a stool managing L LE throughout stance phase due to buckling and therapist approximating L hip throughout stance phase. Patient was able to demonstrate appropriate foot clearance during swing with external VC for improved placement. VC/TC  and facilitation for increased R lateral lean for improved L LE advancement with poor ability to recall throughout gait trials.   Patient returned to her room and gait trained ~15' to EOB with ModA x1- Therapist managing L LE throughout stance phase with patient's R UE on therapist's shoulder. Patient transitioned to supine with supv and encouragement for activating L LE muscles to place LE on the bed. Patient left supine in bed with call bell within reach, bed alarm on and all needs met.    Therapy Documentation Precautions:  Precautions Precautions: Fall Precaution Comments: R craniectomy, no bone flap, bone flap placed in abdomen, R hand mitt, coretrak, flexiseal Restrictions Weight Bearing Restrictions: No   Pain: Reported pain in L eye throughout- Improved when closing eye vs open. Physician notified and aware.    Therapy/Group: Individual Therapy     07/05/2022, 7:46 AM

## 2022-07-05 NOTE — Progress Notes (Signed)
Patient ID: Kristin Simmons, female   DOB: 10-04-1985, 37 y.o.   MRN: [id number]  *SW received call from pt mother stating CAP/DA packet arrived. Will bring by tomorrow morning and will leave in room.   SW went by pt room to provide updates from team conference, no family in room. SW will follow-up with pt mother to provide updates.   Cecile Sheerer, MSW, LCSWA Office: [phone] Cell: [phone] Fax: [phone]

## 2022-07-05 NOTE — Progress Notes (Signed)
PROGRESS NOTE   Subjective/Complaints: No events overnight.  Continues to complain of R headache and eye pain; unchanged. Denies any comfort from heating pad on shoulder.   Per SW, family will be around 24/7 and is organizing when they will be home.   Per therapies, has been sleepy and flat the last 2 days, starting yesterday morning. No new gross deficits just "not her usual self", less interactive.    ROS:  + R eye pressure/pain -ongoing + R shoulder/neck pain/arm pain-ongoing + Lethargy/insomnia - worsening today + generalized itching - improved  Denies fevers, chills, N/V, abdominal pain, constipation, diarrhea, SOB, cough, chest pain, new weakness or paraesthesias.     Objective:   No results found. Recent Labs    07/03/22 0538 07/04/22 0734  WBC 4.3 4.1  HGB 11.4* 12.8  HCT 34.8* 38.5  PLT 399 359    Recent Labs    07/03/22 0538 07/04/22 0734  NA 138 134*  K 4.3 4.0  CL 102 99  CO2 25 24  GLUCOSE 91 86  BUN 10 10  CREATININE 0.66 0.61  CALCIUM 10.0 10.1     Intake/Output Summary (Last 24 hours) at 07/05/2022 0920 Last data filed at 07/05/2022 0759 Gross per 24 hour  Intake 329 ml  Output --  Net 329 ml         Physical Exam: Vital Signs Blood pressure 112/77, pulse 92, temperature 97.6 F (36.4 C), resp. rate 18, height 5\' 4"  (1.626 m), weight 53.2 kg, SpO2 98 %. Constitutional: No acute distress, laying in bed, lethargic HENT: No JVD. Neck Supple. Trachea midline. + R craniotomy site swelling -resolving eyes: PERRLA. EOMI grossly intact , improved tolerance of lateral gaze in both directions with R eye today but remains preferentially closed Cardiovascular: RRR.  No murmurs, rubs, gallops. Respiratory: CTA B/L no W/R/R Abdomen: soft, NT, ND, (+) hyperactive BS since eating GU: Not examined.   Skin: R craniectomy and R abdominal surgical sites - healing,  CDI.   + Bilateral periorbital  hematoma - resolving  MSK:     + TTP L shoulder, R lateral orbit and temple below defect      Strength:                RUE: 5/5 SA, 5/5 EF, 5/5 EE, 5/5 WE, 5/5 FF, 5/5 FA                 LUE:  2/5 FF, 2/5 FA, 2/5 EF, 2/5 EE -improved                RLE: 5/5 HF, 5/5 KE, 5/5 DF, 5/5 EHL, 5/5 PF                 LLE:  No effort on exam; notable bed mobility  Neurologic exam:  Cognition: AAO to person, place, month and year; states today is Thursday - ? perseverative on this over last 2 days Language: Fluent, No substitutions or neoglisms. No dysarthria.  + Some mild impulsivity , disinhibition -ongoing  Insight: Poor insight into current condition.  Mood: Flat affect, lethargic Sensation: Altered sensation to light touch in left upper extremity and left  lower extremity -no longer painful or hypersensitive-ongoing CN: + L tongue deviation - minimal Coordination: No apparent tremors.  Spasticity: MAS 0 left finger flexors, left hip adductors -improved   Assessment/Plan: 1. Functional deficits which require 3+ hours per day of interdisciplinary therapy in a comprehensive inpatient rehab setting. Physiatrist is providing close team supervision and 24 hour management of active medical problems listed below. Physiatrist and rehab team continue to assess barriers to discharge/monitor patient progress toward functional and medical goals  Care Tool:  Bathing    Body parts bathed by patient: Left arm, Chest, Abdomen, Right upper leg   Body parts bathed by helper: Right arm, Front perineal area, Buttocks, Left upper leg, Right lower leg, Left lower leg, Face     Bathing assist Assist Level: Moderate Assistance - Patient 50 - 74%     Upper Body Dressing/Undressing Upper body dressing   What is the patient wearing?: Pull over shirt    Upper body assist Assist Level: Moderate Assistance - Patient 50 - 74%    Lower Body Dressing/Undressing Lower body dressing      What is the patient  wearing?: Pants     Lower body assist Assist for lower body dressing: Maximal Assistance - Patient 25 - 49%     Toileting Toileting    Toileting assist Assist for toileting: Maximal Assistance - Patient 25 - 49%     Transfers Chair/bed transfer  Transfers assist  Chair/bed transfer activity did not occur: Safety/medical concerns  Chair/bed transfer assist level: Moderate Assistance - Patient 50 - 74%     Locomotion Ambulation   Ambulation assist      Assist level: Maximal Assistance - Patient 25 - 49% Assistive device: Orthosis Max distance: 50   Walk 10 feet activity   Assist     Assist level: Maximal Assistance - Patient 25 - 49% Assistive device: Orthosis, Walker-hemi   Walk 50 feet activity   Assist Walk 50 feet with 2 turns activity did not occur: Safety/medical concerns (Patient unable to ambulate >30' at this time secondary to fatigue, poor endurance/activity tolerance, impaired attention, increased pain, etc.)  Assist level: Maximal Assistance - Patient 25 - 49% Assistive device: Walker-hemi    Walk 150 feet activity   Assist Walk 150 feet activity did not occur: Safety/medical concerns         Walk 10 feet on uneven surface  activity   Assist Walk 10 feet on uneven surfaces activity did not occur: Safety/medical concerns         Wheelchair     Assist Is the patient using a wheelchair?: Yes Type of Wheelchair: Manual (per therapist, pt using TIS/Recline back wheelchair)    Wheelchair assist level: Dependent - Patient 0% Max wheelchair distance: 150'    Wheelchair 50 feet with 2 turns activity    Assist        Assist Level: Dependent - Patient 0%   Wheelchair 150 feet activity     Assist      Assist Level: Dependent - Patient 0%   Blood pressure 112/77, pulse 92, temperature 97.6 F (36.4 C), resp. rate 18, height 5\' 4"  (1.626 m), weight 53.2 kg, SpO2 98 %.  1. 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 Conchita Paris. Bone flap in abdominal pocket, plan for subacute flap replacement after IPR             -  patient may  shower if cover incisions             -ELOS/Goals: 6/5 goal DC date; supervision to min A   Safety precautions due to poor impulse control - Helmet ordered for OOB 5/8; WHO and [facility] 5/9  Con't CIR PT, OT and SLP   - 5/21: AFO order   - 5/14: Starting to advance L leg with some buckling, Min A goals seem appropriate. SLP attention, recall improving but family is triggering and she has behaviors when family comes in during therapies.   5/15: CT head showing improved edema, small SDH, and unchanged 8 mm midline shift  5/21: poor L attention but getting some functional grasp in LUE; L lateral lean complicating EOB balance; CGA-Min A transfer and Min-Mod transfers mostly c/b poor safety awareness. Walking up to 180 ft with handheld assist, can advance L leg but gets buckling in L knee  despite anterior support AFO.   2 Antithrombotics: -DVT/anticoagulation:  Pharmaceutical: Lovenox initiated 07/04/2022             -antiplatelet therapy: N/A  3. Pain Management/neuropathic pain: Oxycodone as needed  5/8: added lidocaine patch x2 to L neck, shoulder  5/9: Add gabapentin 300 mg TID for LUE and LLE pain, likely related to spasticity and neuropathic.   5/10: Mild lethargy today but no use of PRN pain medications; decrease gabapentin to 100 mg TID  5/11: kpad and left shoulder sling ordered  5/23: Using oxycodone Q4H; continue  5/14: Using tylenol and oxycodone together consistently; schedule tylenol 1000 mg TID and oxycodone 5 mg TID; add oxycodone 5 mg BID Prn for breakthrough  -- Increase gabapentin to 200/200/400 mg daily  5/16: Increase gabapentin to 200/200/600 mg for pain and sleep  5/17 try voltaren gel L shoulder  5/18-5/19- denies pain this AM  5/20:  Due to no improvement with prior measures, discontinue daytime gabapentin, standing Tylenol, and standing oxycodone.  Changed headache regimen to Fioricet 1 tab 4 times daily as needed, and oxycodone 5 mg 4 times daily as needed for pain.  5/21 d/t daytime lethargy + poor pain control, change gabapentin 600 mg QHS to 200 mg TID. DC fioricet, resume scheduled tylenol 1000 mg TID. Continue PRN oxy  4. Mood/Behavior/Sleep: PRN twice daily as needed provide emotional support             -antipsychotic agents: N/A  - Sleep log added - poor sleep 3 hours last night. Add melatonin 5 mg QHS + PRN trazodone 50-75 mg.   5/10: Slept 7 hours overnight, still endorsing poor sleep.  Did not use as needed trazodone, will add 50 mg standing nightly.  5/11: continue trazodone  5/13: Per log slept 5 hours overnight, however was not updated 5-10: Will recheck tomorrow  5.14: slept 7 hours overnight; see above for increased QHS gabapentin  5/15: s sleep log not completed overnight, informed nursing.  Patient endorses ongoing poor sleep, no improvement with increase gabapentin.  5/16: interrupted sleep 7+ hours; increase trazodone to 75 mg and increase gabapentin as above  5/18-20 sleeping better  5/21: Worsening lethargy per therapies over last 2 days; possible etiologies include medication induced, poor pain control, and metabolic/hyponatremia. DC fioricet, decrease nighttime gabapentin, stat CBC and BMP ordered. Sleep log remains interrupted but appropriate.   5. Neuropsych/cognition: This patient is not capable of making decisions on her own behalf -not consistently oriented, impulsive with poor awareness..   - Obs for safety  5/19- con't Telesitter  and NT for meals  6. Skin/Wound Care: Routine skin checks  - 5/8: 3 weeks post-R craniectomy; ordered staple removal from scalp and abdomen. no apparent wounds will DC rectal tube    7. Dysphagia: placed order for Cortrak removal 5/12 Diet Orders (From admission,  onward)     Start     Ordered   07/04/22 1303  DIET DYS 3 Room service appropriate? Yes with Assist; Fluid consistency: Thin; Fluid restriction: 1200 mL Fluid  Diet effective now       Comments: Meds whole with liquid; full supervision with meals  Question Answer Comment  Room service appropriate? Yes with Assist   Fluid consistency: Thin   Fluid restriction: 1200 mL Fluid      07/04/22 1302          5/13: Monitor POs with cortrak removal 5/18-20 eating well    8.  Hyponatremia.  Continue sodium chloride tablets.  Florinef 0.3 mg 3 times daily  - 5/8: Na 134 this AM. Downtrending over last 2 days. No obvious medication contributors; Already on salt tabs 2g TID. Placed 1500 ml fluid restriction, repeat today 138. Ordered daily BMP x3 days for monitoring. Reduce to Florinef 0.1 mg TID and monitor.   5/9: Na stable 13. Diastolic BP improved, remains high systolic. Orthostatic vitals negative with BP. Given florinef indication is hyponatremia not orthostasis will continue 0.1 mg TID through Friday then DC if no significant drop in Na.   5-10: Na down to 135 with mildly increased lethargy.  Increase Florinef to 0.2 mg 3 times daily, continue daily BMP for 3 days.  5/12: Na improved to 136, decrease Florinef to 0.1mg  TID given hypertension  5/13: NA 136 with decreased Florinef; reduced dosing to twice daily and recheck BMP Wednesday  5/14: DC florinef d/t persistent hypertension; labs pending  5/16: Na stable; will check today, then reduce Na starting tomorrow if appropriate with repeat labs Sunday  5/18- will recheck labs in AM/Sunday- ordered labs- will also reduce Na levels to 2G BID with meals from TID  5/19- Na 138, but reduction hasn't shown up yet, since got lunch NA yesterday- will recheck this week 5/20: NA low 134 with reduction, resume 2 g 3 times daily dosing and recheck on Wednesday 5/21: repeat BMP today stat d/t lethergy  10.  Decreased nutritional storage.  Currently on a  dysphagia #3 diet.  NGT d/ced   - 5/14: DC SSI, CBG  11.  Urinary retention.  Urecholine 5 mg 3 times daily.  Check PVR - resolved  5/8: No PVRs overnight; ordered. Mixed continence overnight.   5/9: Continue UO and low PVRs. DC urecholine.   5/10: PVRs remain low, continent.  Continue for 24 hours, then DC PVRs.  5/19- PVRs off- resolved  12.  History of tobacco/alcohol use.  Counseling  13. Diarrhea. Remove rectal tube 5/8. Increase banatrol to TID.     - 5/9: No recorded Bms since rectal tube removal; reduce banatrol back to BID, obtain KUB  - 5/10: DC banatrol. KUB with mild stool burden.  If no BM today, will need to start laxatives.   5/11: magnesium citrate ordered  5/12: flowsheet reviewd and had type 5 stools today.   5/13: soft stools, last BM 5/12, montior  5/15: Liquid Bms 5/13; now on PO diet, monitor  5/16: add colace 100 mg BID + miralax daily PRN  5/17 schedule miralax  5/18- LBM x2    14. HTN: magnesium citrate ordered, likely elevated  due to Florinef, decrease to 1 tab TID    07/05/2022    4:26 AM 07/04/2022    7:36 PM 07/04/2022    3:06 PM  Vitals with BMI  Systolic 112 119 161  Diastolic 77 76 80  Pulse 92 93 88    - 5/13: PRN hydralazine used 1x overnight; BP remains significantly elevated with decreased Florinef.  Add amlodipine 5 mg daily.  - 5/14: diastolic remains elevated; add lisinopril 5 mg daily. Will take 2-3 days for amlodipine effect. DC florinef - 5/15: Much improved;  DC lisinopril; Dc amlodipine this PM if remains normotensive 5/16: Diastolic remains elevated increase norvasc to 10 mg daily 5/17 controlled overall, continue current  5/18- 5/20- BP controlled- con't regimen  15. L eye pain/pressure: Getting MRI brain and orbits w/o contrast d/t worsening symptoms and poor EOMI tolerance; should have been improving with decreased postop edema   - 5/16: MRI converted to CT d/t clips likely to obscure relevant areas. Found "Minimal hazy asymmetric  stranding within the right intraorbitalfat, suspected to reflect a degree of passive venous congestion due to the evolving process within the right cerebral hemisphere." No focal s/s cellulitis, but will get CBC today for eval. No management changes, reassured patient and family.   5-20: Ongoing, Fioricet as above.  Labs appear appropriate, no concern for infection.  5/21: Adjusting pain regimen as above. EOMI tolerance improving. Monitor.   16. Itching, generalized  -5/17 No rash noted, may be dry skin related, start moisturizer, continue benadryl crem      LOS: 14 days A FACE TO FACE EVALUATION WAS PERFORMED  Angelina Sheriff 07/05/2022, 9:20 AM

## 2022-07-05 NOTE — Progress Notes (Signed)
Occupational Therapy Session Note  Patient Details  Name: Kristin Simmons MRN: [id number] Date of Birth: 04/08/1985  Today's Date: 07/05/2022 OT Individual Time: 1405-1509 OT Individual Time Calculation (min): 64 min   Short Term Goals: Week 2:  OT Short Term Goal 1 (Week 2): Patient will demonstrate improved awarenress of L hemi body by positioning L UE prior to transfer OT Short Term Goal 2 (Week 2): Patient will complete 1 step of LB dressing task OT Short Term Goal 3 (Week 2): Patient will dress L UE first with mod A  Skilled Therapeutic Interventions/Progress Updates:    Pt greeted semi-reclined in bed asleep, easy to wake and agreeable to OT treatment session. Pt reported need to go to the bathroom. AFO and shoes donned at EOB max A. Stand-pivot to wc on L side with min A, then min A stand-pivot to BSC over toilet. Mod A to manage clothing prior to sitting. Pt voided bladder and completed peri-care in sitting with set-up A.Pt then pivoted back to with with mod A to the L. Pt brought to therapy gym in wc and pivoted to mat with min A. Pt brought into sidelying for L UE NMR in gravity eliminated position. Utilized arm skate with trace scapular activation and trace shoulder. Moved ito sitting and continued with reps of shoulder flex/ext with max A guiding from OT. E-stim placed on shoulder for pain management and sublux. Addressed grasp/release with foam on L hand. Pt able to initiate grasp, but no finger extension for release. Pt returned to room as phlebotomist entered to get blood. Pt very tearful and did not want to give blood. OT provided support to L shoulder in order for pt to keep L elbow extended without pain. OT provided emotional support while blood draw took place. Pt left seated in TIS wc with chair alarm belt on, call bell in reach, L UE supported on 1/2 lap tray. OT placed SAEBO e-stim on wrist extensors. SAEBO left on for 60 minutes. OT returned to remove SAEBO with skin intact and  no adverse reactions.  [place] Stim One 330 pulse width 35 Hz pulse rate On 8 sec/ off 8 sec Ramp up/ down 2 sec Symmetrical Biphasic wave form  Max intensity at 500 Ohm load   Therapy Documentation Precautions:  Precautions Precautions: Fall Precaution Comments: R craniectomy, no bone flap, bone flap placed in abdomen, R hand mitt, coretrak, flexiseal Restrictions Weight Bearing Restrictions: No Pain:  No number given. Pt reported pain in abdomen where flap is. Rest and repositioned for comfort.    Therapy/Group: Individual Therapy  Mal Amabile 07/05/2022, 2:37 PM

## 2022-07-05 NOTE — Progress Notes (Signed)
Orthopedic Tech Progress Note Patient Details:  Kristin Simmons Jan 11, 1986 [id number]  Called in order to [facility] for an AFO CONSULT    Patient ID: Kristin Simmons, female   DOB: 01-30-1986, 37 y.o.   MRN: [id number]  [name] 07/05/2022, 11:35 AM

## 2022-07-05 NOTE — Patient Care Conference (Signed)
Inpatient RehabilitationTeam Conference and Plan of Care Update Date: 07/05/2022   Time: 10:42 AM    Patient Name: Kristin Simmons      Medical Record Number: [id number]  Date of Birth: 05-07-1985 Sex: Female         Room/Bed: 4W08C/4W08C-01 Payor Info: Payor: MEDICAID [place] / Plan: MEDICAID [place] ACCESS / Product Type: *No Product type* /    Admit Date/Time:  06/21/2022  5:27 PM  Primary Diagnosis:  SAH (subarachnoid hemorrhage) [facility])  Hospital Problems: Principal Problem:   SAH (subarachnoid hemorrhage) (HCC) Active Problems:   Right internal carotid artery aneurysm    Expected Discharge Date: Expected Discharge Date: 07/20/22  Team Members Present: Physician leading conference: Dr. Elijah Birk Social Worker Present: Cecile Sheerer, LCSWA Nurse Present: Vedia Pereyra, RN PT Present: Amedeo Plenty, PT OT Present: Kearney Hard, OT SLP Present: Feliberto Gottron, SLP PPS Coordinator present : Fae Pippin, SLP     Current Status/Progress Goal Weekly Team Focus  Bowel/Bladder   Pt is continent of bowel/bladder   Pt will remain continent of bowel/bladder   Will assess qshift and PRN    Swallow/Nutrition/ Hydration   Dys. 3 textures with thin liquids, Min A for use of swallowing strategies   Supervision  use of swallowing strategies, trials of regular textures    ADL's   Min/Mod A overall   Min A overall   L NMES, NMR, activity tolerance, balance, self-care retraining, pt/family education    Mobility   CGA/SBA for bed mobility with HR; MinA/CGA for sit/stands; Min/ModA for stand pivot transfers without AD; ModA for gait up to 180' with R HHA from rehab tech and Min/ModA from therapist managing L LE   CGA/MinA  Sitting balance, standing balance, gait, sit/stands, transfers, gait, stair mobility, L NMR, endurance/activity tolerance, attention, awareness, pain, education, discharge planning, etc.    Communication                Safety/Cognition/ Behavioral  Observations  Min-Mod A   Supervision   selective attention, functional problem solving for mildly complex tasks, recall of daily information, emergent awareness, scanning/attention to left    Pain   Pt currently complians of headache on the right side of the head   Pt's pain is controlled with medication   Will assess qshift and PRN    Skin   Pt's incisions have healed and skin is intact   Pt's skin will stay intact  Will assess qshift and PRN      Discharge Planning:  D/c plan remains pt will discharge to home with support from her family, and children. Pt mother likley to help in the mornings due to working third shift. CAP/DA referral submitted. Pt mother will need to manage disability as she initated an application and [facility] unable to assist now. PCS referral will be submitted closer towards discharge. Fam mtg held on Thursday (5/16) with pt mother and children's father. Children's father scheduled for fam edu 5/29, 30, 31 1pm-4pm. SW will confirm there are no barriers to discharge.   Team Discussion: SAH. Tele-sitter. Helmet when OOB. Watching right eye pain. CT of head and orbits completed. No s/s of infection at this time. Good PO intake. Hyponatremia improving. Salt tabs. D3-1256mL fluid restriction. Sleep chart for sleep/wake cycle. Improving. Neruo-psych for mood/behavior.  Impulsiveness improving.  Patient on target to meet rehab goals: yes, working toward goals  *See Care Plan and progress notes for long and short-term goals.   Revisions to Treatment Plan:  Monitor pain, medication adjustments. Monitor labs/VS Teaching Needs: Medications, safety, self care, gait/transfer training, skin care, etc.   Current Barriers to Discharge: Decreased caregiver support, wound care, and behavior  Possible Resolutions to Barriers: Family education, nursing education, monitor incision site for s/s of infection, order recommended DME if needed.      Medical  Summary Current Status: medically complicated by insomnia, mood/behavior, R eye pain, headache, abdominal discomfort, hyponatremia  Barriers to Discharge: Behavior/Mood;Electrolyte abnormality;Medical stability;Self-care education;Uncontrolled Pain  Barriers to Discharge Comments: R eye pain/headache, hyponatremia, behavior Possible Resolutions to [employer] Focus: safety plan, titrating pain medications, monitorring crani site for increased edema/changes, monitorring labs and adjusting steroids/salt tabs for low Na   Continued Need for Acute Rehabilitation Level of Care: The patient requires daily medical management by a physician with specialized training in physical medicine and rehabilitation for the following reasons: Direction of a multidisciplinary physical rehabilitation program to maximize functional independence : Yes Medical management of patient stability for increased activity during participation in an intensive rehabilitation regime.: Yes Analysis of laboratory values and/or radiology reports with any subsequent need for medication adjustment and/or medical intervention. : Yes   I attest that I was present, lead the team conference, and concur with the assessment and plan of the team.   Jearld Adjutant 07/05/2022, 4:07 PM

## 2022-07-05 NOTE — Progress Notes (Signed)
Speech Language Pathology Daily Session Note  Patient Details  Name: Kristin Simmons MRN: [id number] Date of Birth: [birth year]  Today's Date: 07/05/2022 SLP Individual Time: 1105-1200 SLP Individual Time Calculation (min): 55 min  Short Term Goals: Week 2: SLP Short Term Goal 1 (Week 2): Patient will consume current diet with minimal overt s/s of aspiration with supervision supervision level verbal cues for use of swallowing compensatory strategies. SLP Short Term Goal 2 (Week 2): Patient will demonstrate sustained attention to functional tasks for 30 minutes with Mod verbal cues for redirection. SLP Short Term Goal 3 (Week 2): Patient will attend to left visual field during functional tasks with Min A multimodal cues. SLP Short Term Goal 4 (Week 2): Patient will demonstrate functional problem solving for mildly complex tasks with Min verbal cues. SLP Short Term Goal 5 (Week 2): Patient will self-monitor and correct errors during functional tasks with Min verbal and visual cues. SLP Short Term Goal 6 (Week 2): Patient will recall new, daily information with Mod multimodal cues.  Skilled Therapeutic Interventions: Skilled treatment session focused on cognitive goals. Upon arrival, patient was asleep in bed and required extra time for arousal with patient repositioned to maximize attention and wakefulness. SLP facilitated session by providing Min verbal cues for left visual scanning and sustained attention during the [place] Visual Cancellation Test-Single Stimuli. Patient missed 11 out of 105 occurrences and misses occurred within all visual fields. [name] overall affect remains flat with decreased verbalizations with patient becoming emotional when discussing feelings of loneliness and missing her family. SLP provided emotional support. Patient requested to use the commode and required Mod-Max verbal cues for safety with task due to impulsivity. Patient was continent of bowel and  bladder. Patient transferred back to bed at end of session and left with alarm on and all needs within reach. Continue with current plan of care.      Pain Pain in neck and left shoulder Heat applied and patient repositioned   Therapy/Group: Individual Therapy  ,  07/05/2022, 12:07 PM

## 2022-07-06 MED ORDER — GABAPENTIN 400 MG PO CAPS
400.0000 mg | ORAL_CAPSULE | Freq: Every day | ORAL | Status: DC
  Administered 2022-07-07 – 2022-07-10 (×4): 400 mg via ORAL
  Filled 2022-07-06 (×4): qty 1

## 2022-07-06 MED ORDER — GABAPENTIN 100 MG PO CAPS
200.0000 mg | ORAL_CAPSULE | Freq: Once | ORAL | Status: AC
  Administered 2022-07-07: 200 mg via ORAL
  Filled 2022-07-06: qty 2

## 2022-07-06 MED ORDER — TRAZODONE HCL 50 MG PO TABS
100.0000 mg | ORAL_TABLET | Freq: Every day | ORAL | Status: DC
  Administered 2022-07-07 – 2022-07-14 (×7): 100 mg via ORAL
  Filled 2022-07-06 (×8): qty 2

## 2022-07-06 MED ORDER — BACLOFEN 5 MG HALF TABLET
5.0000 mg | ORAL_TABLET | Freq: Three times a day (TID) | ORAL | Status: DC | PRN
  Administered 2022-07-07 – 2022-07-08 (×2): 5 mg via ORAL
  Filled 2022-07-06 (×2): qty 1

## 2022-07-06 MED ORDER — GABAPENTIN 100 MG PO CAPS
200.0000 mg | ORAL_CAPSULE | Freq: Two times a day (BID) | ORAL | Status: DC
  Administered 2022-07-07 – 2022-07-19 (×25): 200 mg via ORAL
  Filled 2022-07-06 (×25): qty 2

## 2022-07-06 MED ORDER — SENNOSIDES-DOCUSATE SODIUM 8.6-50 MG PO TABS
1.0000 | ORAL_TABLET | Freq: Two times a day (BID) | ORAL | Status: DC
  Administered 2022-07-06 – 2022-07-20 (×27): 1 via ORAL
  Filled 2022-07-06 (×30): qty 1

## 2022-07-06 NOTE — Progress Notes (Signed)
Speech Language Pathology Weekly Progress and Session Note  Patient Details  Name: Kristin Simmons MRN: [id number] Date of Birth: 22-Apr-1985  Beginning of progress report period: Jun 29, 2022 End of progress report period: Jul 06, 2022  Today's Date: 07/06/2022 SLP Individual Time: 4098-1191 SLP Individual Time Calculation (min): 39 min  Short Term Goals: Week 2: SLP Short Term Goal 1 (Week 2): Patient will consume current diet with minimal overt s/s of aspiration with supervision supervision level verbal cues for use of swallowing compensatory strategies. SLP Short Term Goal 1 - Progress (Week 2): Not met SLP Short Term Goal 2 (Week 2): Patient will demonstrate sustained attention to functional tasks for 30 minutes with Mod verbal cues for redirection. SLP Short Term Goal 2 - Progress (Week 2): Met SLP Short Term Goal 3 (Week 2): Patient will attend to left visual field during functional tasks with Min A multimodal cues. SLP Short Term Goal 3 - Progress (Week 2): Not met SLP Short Term Goal 4 (Week 2): Patient will demonstrate functional problem solving for mildly complex tasks with Min verbal cues. SLP Short Term Goal 4 - Progress (Week 2): Not met SLP Short Term Goal 5 (Week 2): Patient will self-monitor and correct errors during functional tasks with Min verbal and visual cues. SLP Short Term Goal 5 - Progress (Week 2): Not met SLP Short Term Goal 6 (Week 2): Patient will recall new, daily information with Mod multimodal cues. SLP Short Term Goal 6 - Progress (Week 2): Not met    New Short Term Goals: Week 3: SLP Short Term Goal 1 (Week 3): Patient will consume current diet with minimal overt s/s of aspiration with supervision supervision level verbal cues for use of swallowing compensatory strategies. SLP Short Term Goal 2 (Week 3): Patient will demonstrate sustained attention to functional tasks for 30 minutes with Min verbal cues for redirection. SLP Short Term Goal 3 (Week 3):  Patient will attend to left visual field during functional tasks with Min A multimodal cues. SLP Short Term Goal 4 (Week 3): Patient will demonstrate functional problem solving for mildly complex tasks with Min verbal cues. SLP Short Term Goal 5 (Week 3): Patient will self-monitor and correct errors during functional tasks with Min verbal and visual cues. SLP Short Term Goal 6 (Week 3): Patient will recall new, daily information with Mod multimodal cues.  Weekly Progress Updates: Patient has made minimal and inconsistent gains this reporting period with progress limited by ongoing pain and fatigue. Currently, patient is consuming Dys. 3 textures with thin liquids with overall Min-Mod A verbal cues for use of swallowing compensatory strategies. Patient also requires overall Mod A multimodal cues to complete functional and familiar tasks safely in regards to selective attention, recall of daily information, mildly complex problem solving, and error awareness. Patient and family education ongoing. Patient would benefit from continued skilled SLP intervention to maximize her swallowing and cognitive functioning prior to discharge.      Intensity: Minumum of 1-2 x/day, 30 to 90 minutes Frequency: 1 to 3 out of 7 days Duration/Length of Stay: 07/20/22 Treatment/Interventions: Cognitive remediation/compensation;Cueing hierarchy;Environmental controls;Functional tasks;Internal/external aids;Patient/family education;Therapeutic Activities;Dysphagia/aspiration precaution training   Daily Session  Skilled Therapeutic Interventions:  Skilled treatment session focused on dysphagia and cognitive goals. Upon arrival, patient was asleep in bed but easily awakened and reported she would like to eat her breakfast. SLP facilitated session by providing repositioning to maximize attention and safety with PO intake. Patient consumed Dys. 3 textures with thin  liquids without overt s/s of aspiration but required Mod verbal  cues for use of swallowing compensatory strategies due to impulsivity but supervision for sustained attention to task. Patient reporting pain in arm and back, nursing made aware with plans to administer medications. Patient reporting fatigue and pain impacting her sleep and requested that session ended early. SLP attempted to encourage patient for further participation with minimal success with patient reporting, "you're messing with my sleep." Therefore, session ended 20 minutes early. Patient left upright in bed with alarm on and all needs within reach. Continue with current plan of care.    Pain Pain Assessment Pain Scale: 0-10 Pain Score: 10-Worst pain ever Faces Pain Scale: No hurt Pain Type: Acute pain Pain Location: Arm, back Pain Orientation: Left Pain Radiating Towards: fingers Pain Descriptors / Indicators: Aching;Numbness Pain Frequency: Intermittent Pain Onset: Gradual Patients Stated Pain Goal: 0 Pain Intervention(s): Medication (See eMAR) Multiple Pain Sites: No  Therapy/Group: Individual Therapy  ,  07/06/2022, 6:29 AM

## 2022-07-06 NOTE — Progress Notes (Addendum)
PROGRESS NOTE   Subjective/Complaints: No events overnight.  Continues intermittent 7/10 pain in head and L arm. Had episode of increase pain in LUE overnight, tingling/stabbing. No improvement reported in pain, but notable improved tolerance today with keeping eyes open, EOMI.   Refused AM labs. Stating she is having difficulty sleeping, currently will sleep 7 hours per night but will be tired in AM. At home, she generally sleeps in until 1 pm.   Also continued to complain of tightness feeling in R side over bone flap implantation site. Also, tightness in L posterior knee, ad spasms at nighttime in L leg.    ROS:  + R eye pressure/pain -ongoing + R shoulder/neck pain/arm pain-ongoing + Lethargy/insomnia - improved + RLQ tightness - ongoing +Spasms - worse at nighttime  Denies fevers, chills, N/V, abdominal pain, constipation, diarrhea, SOB, cough, chest pain, new weakness or paraesthesias.     Objective:   No results found. Recent Labs    07/04/22 0734 07/05/22 1226  WBC 4.1 4.3  HGB 12.8 13.8  HCT 38.5 42.0  PLT 359 258    Recent Labs    07/04/22 0734 07/05/22 1506  NA 134* 137  K 4.0 4.2  CL 99 102  CO2 24 23  GLUCOSE 86 90  BUN 10 12  CREATININE 0.61 0.75  CALCIUM 10.1 10.1     Intake/Output Summary (Last 24 hours) at 07/06/2022 1610 Last data filed at 07/06/2022 0604 Gross per 24 hour  Intake 295 ml  Output 275 ml  Net 20 ml         Physical Exam: Vital Signs Blood pressure 128/86, pulse 93, temperature 98.5 F (36.9 C), resp. rate 19, height 5\' 4"  (1.626 m), weight 53.2 kg, SpO2 99 %. Constitutional: No acute distress, sitting up in bed eating lunch HENT: No JVD. Neck Supple. Trachea midline. + R craniotomy site swelling -resolving eyes: PERRLA. EOMI grossly intact , improved tolerance of lateral gaze in both directions - no R eye closure today Cardiovascular: RRR.  No murmurs, rubs,  gallops. Respiratory: CTA B/L no W/R/R Abdomen: soft, NT, ND, (+) hyperactive BS since eating GU: Not examined.   Skin: R craniectomy and R abdominal surgical sites - healing,  CDI.   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 No dysarthria.  + Some mild impulsivity , disinhibition -ongoing +Perseveration  on bone flap in abdomen, headache  Insight: Poor insight into current condition.  Mood: Flat affect, more alert, no depression/anxiety Sensation: Altered sensation to light touch in left upper extremity and left lower extremity -no longer painful or hypersensitive-ongoing CN: + L tongue deviation - minimal Coordination: No apparent tremors.  Spasticity: MAS 0 left finger flexors, left hip adductors -improved   Assessment/Plan: 1. Functional deficits which require 3+ hours per day of interdisciplinary therapy in a comprehensive inpatient rehab setting. Physiatrist is providing close team supervision and 24 hour management of active medical problems listed below. Physiatrist and rehab team continue to assess barriers to discharge/monitor patient progress toward functional and medical goals  Care Tool:  Bathing    Body parts bathed by patient: Left arm, Chest, Abdomen, Right upper leg   Body parts bathed by helper: Right arm, Front perineal area, Buttocks, Left upper leg, Right lower leg, Left lower leg, Face     Bathing assist Assist Level: Moderate Assistance - Patient 50 - 74%     Upper Body Dressing/Undressing Upper body dressing   What is the patient  wearing?: Pull over shirt    Upper body assist Assist Level: Moderate Assistance - Patient 50 - 74%    Lower Body Dressing/Undressing Lower body dressing      What is the patient wearing?: Pants     Lower body assist Assist for lower body dressing: Maximal Assistance - Patient 25 - 49%     Toileting Toileting    Toileting assist Assist for toileting: Maximal Assistance - Patient 25 - 49%     Transfers Chair/bed transfer  Transfers assist  Chair/bed transfer activity did not occur: Safety/medical concerns  Chair/bed transfer assist level: Moderate Assistance - Patient 50 - 74%     Locomotion Ambulation   Ambulation assist      Assist level: Maximal Assistance - Patient 25 - 49% Assistive device: Orthosis Max distance: 50   Walk 10 feet activity   Assist     Assist level: Maximal Assistance - Patient 25 - 49% Assistive device: Orthosis, Walker-hemi   Walk 50 feet activity   Assist Walk 50 feet with 2 turns activity did not occur: Safety/medical concerns (Patient unable to ambulate >30' at this time secondary to fatigue, poor endurance/activity tolerance, impaired attention, increased pain, etc.)  Assist level: Maximal Assistance - Patient 25 - 49% Assistive device: Walker-hemi    Walk 150 feet activity   Assist Walk 150 feet activity did not occur: Safety/medical concerns         Walk 10 feet on uneven surface  activity   Assist Walk 10 feet on uneven surfaces activity did not occur: Safety/medical concerns         Wheelchair     Assist Is the patient using a wheelchair?: Yes Type of Wheelchair: Manual (per therapist, pt using TIS/Recline back wheelchair)    Wheelchair assist level: Dependent - Patient 0% Max wheelchair distance: 150'    Wheelchair 50 feet with 2 turns activity    Assist        Assist Level: Dependent - Patient 0%   Wheelchair 150 feet activity     Assist      Assist Level: Dependent -  Patient 0%   Blood pressure 128/86, pulse 93, temperature 98.5 F (36.9 C), resp. rate 19, height 5\' 4"  (1.626 m), weight 53.2 kg, SpO2 99 %.  1. 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 Conchita Paris. Bone flap in abdominal pocket, plan for subacute flap replacement after IPR             -patient may  shower if cover incisions             -ELOS/Goals: 6/5 goal DC date; supervision to min A   Safety precautions due to poor impulse control - Helmet ordered for OOB 5/8; [facility] and [facility] 5/9  Con't CIR PT, OT and SLP   - 5/21: AFO order   - 5/14: Starting to advance L leg with some buckling, Min A goals seem appropriate. SLP attention, recall improving but family is triggering and she has behaviors when family comes in during therapies.   5/15: CT head showing improved edema, small SDH, and unchanged 8 mm midline shift  5/21: poor L attention but getting some functional grasp in LUE; L lateral lean complicating EOB balance; CGA-Min A transfer and Min-Mod transfers mostly c/b poor safety awareness. Walking up to 180 ft with handheld assist, can advance L leg but gets buckling in L knee  despite anterior support AFO.   2 Antithrombotics: -DVT/anticoagulation:  Pharmaceutical: Lovenox initiated 07/04/2022             -antiplatelet therapy: N/A  3. Pain Management/neuropathic pain: Oxycodone as needed  5/8: added lidocaine patch x2 to L neck, shoulder  5/9: Add gabapentin 300 mg TID for LUE and LLE pain, likely related to spasticity and neuropathic.   5/10: Mild lethargy today but no use of PRN pain medications; decrease gabapentin to 100 mg TID  5/11: kpad and left shoulder sling ordered  5/23: Using oxycodone Q4H; continue  5/14: Using tylenol and oxycodone together consistently; schedule tylenol 1000 mg TID and oxycodone 5 mg TID; add oxycodone 5 mg BID Prn for  breakthrough  -- Increase gabapentin to 200/200/400 mg daily  5/16: Increase gabapentin to 200/200/600 mg for pain and sleep  5/17 try voltaren gel L shoulder  5/18-5/19- denies pain this AM  5/20: Due to no improvement with prior measures, discontinue daytime gabapentin, standing Tylenol, and standing oxycodone.  Changed headache regimen to Fioricet 1 tab 4 times daily as needed, and oxycodone 5 mg 4 times daily as needed for pain.  5/21 d/t daytime lethargy + poor pain control, change gabapentin 600 mg QHS to 200 mg TID. DC fioricet, resume scheduled tylenol 1000 mg TID. Continue PRN oxy  5/22: increase QHS gabapentin to 400 mg given increased sensitivity/pain after cutting back from 600 qhs. Add baclofen 5 mg TID PRN for spasms  4. Mood/Behavior/Sleep: PRN twice daily as needed provide emotional support             -antipsychotic agents: N/A  - Sleep log added - poor sleep 3 hours last night. Add melatonin 5 mg QHS + PRN trazodone 50-75 mg.   5/10: Slept 7 hours overnight, still endorsing poor sleep.  Did not use as needed trazodone, will add 50 mg standing nightly.  5/11: continue trazodone  5/13: Per log slept 5 hours overnight, however was not updated 5-10: Will recheck tomorrow  5.14: slept 7 hours overnight; see above for increased QHS gabapentin  5/15: s sleep log not completed overnight, informed nursing.  Patient endorses ongoing poor sleep, no improvement with increase gabapentin.  5/16: interrupted sleep 7+ hours; increase trazodone to 75 mg and increase gabapentin as above  5/18-20 sleeping better  5/21: Worsening lethargy per therapies over last 2  days; possible etiologies include medication induced, poor pain control, and metabolic/hyponatremia. DC fioricet, decrease nighttime gabapentin, stat CBC and BMP ordered. Sleep log remains interrupted but appropriate.   5/22: CBC, BMP WNL. Re-time sleep medications to 2000. Increase gabapentin QHS as above and Trazadone to 100 mg QHS.    5. Neuropsych/cognition: This patient is not capable of making decisions on her own behalf -not consistently oriented, impulsive with poor awareness..   - Obs for safety  5/19- con't Telesitter and NT for meals  6. Skin/Wound Care: Routine skin checks  - 5/8: 3 weeks post-R craniectomy; ordered staple removal from scalp and abdomen. no apparent wounds will DC rectal tube    7. Dysphagia: placed order for Cortrak removal 5/12 Diet Orders (From admission, onward)     Start     Ordered   07/04/22 1303  DIET DYS 3 Room service appropriate? Yes with Assist; Fluid consistency: Thin; Fluid restriction: 1200 mL Fluid  Diet effective now       Comments: Meds whole with liquid; full supervision with meals  Question Answer Comment  Room service appropriate? Yes with Assist   Fluid consistency: Thin   Fluid restriction: 1200 mL Fluid      07/04/22 1302          5/13: Monitor POs with cortrak removal 5/18-22 eating well    8.  Hyponatremia.  Continue sodium chloride tablets.  Florinef 0.3 mg 3 times daily  - 5/8: Na 134 this AM. Downtrending over last 2 days. No obvious medication contributors; Already on salt tabs 2g TID. Placed 1500 ml fluid restriction, repeat today 138. Ordered daily BMP x3 days for monitoring. Reduce to Florinef 0.1 mg TID and monitor.   5/9: Na stable 13. Diastolic BP improved, remains high systolic. Orthostatic vitals negative with BP. Given florinef indication is hyponatremia not orthostasis will continue 0.1 mg TID through Friday then DC if no significant drop in Na.   5-10: Na down to 135 with mildly increased lethargy.  Increase Florinef to 0.2 mg 3 times daily, continue daily BMP for 3 days.  5/12: Na improved to 136, decrease Florinef to 0.1mg  TID given hypertension  5/13: NA 136 with decreased Florinef; reduced dosing to twice daily and recheck BMP Wednesday  5/14: DC florinef d/t persistent hypertension; labs pending  5/16: Na stable; will check today, then  reduce Na starting tomorrow if appropriate with repeat labs Sunday  5/18- will recheck labs in AM/Sunday- ordered labs- will also reduce Na levels to 2G BID with meals from TID  5/19- Na 138, but reduction hasn't shown up yet, since got lunch NA yesterday- will recheck this week 5/20: NA low 134 with reduction, resume 2 g 3 times daily dosing and recheck on Wednesday 5/21: repeat BMP today stat d/t lethergy - stable  10.  Decreased nutritional storage.  Currently on a dysphagia #3 diet.  NGT d/ced   - 5/14: DC SSI, CBG  11.  Urinary retention.  Urecholine 5 mg 3 times daily.  Check PVR - resolved  5/8: No PVRs overnight; ordered. Mixed continence overnight.   5/9: Continue UO and low PVRs. DC urecholine.   5/10: PVRs remain low, continent.  Continue for 24 hours, then DC PVRs.  5/19- PVRs off- resolved  12.  History of tobacco/alcohol use.  Counseling  13. Diarrhea. Remove rectal tube 5/8. Increase banatrol to TID.     - 5/9: No recorded Bms since rectal tube removal; reduce banatrol back to BID, obtain KUB  -  5/10: DC banatrol. KUB with mild stool burden.  If no BM today, will need to start laxatives.   5/11: magnesium citrate ordered  5/12: flowsheet reviewd and had type 5 stools today.   5/13: soft stools, last BM 5/12, montior  5/15: Liquid Bms 5/13; now on PO diet, monitor  5/16: add colace 100 mg BID + miralax daily PRN  5/17 schedule miralax  5/18- LBM x2   5/22: change colace to sennakot s 1 tab BID  14. HTN: magnesium citrate ordered, likely elevated due to Florinef, decrease to 1 tab TID    07/06/2022    5:29 AM 07/05/2022    9:07 PM 07/05/2022   12:46 PM  Vitals with BMI  Systolic 128 118 782  Diastolic 86 82 85  Pulse 93 90 94    - 5/13: PRN hydralazine used 1x overnight; BP remains significantly elevated with decreased Florinef.  Add amlodipine 5 mg daily.  - 5/14: diastolic remains elevated; add lisinopril 5 mg daily. Will take 2-3 days for amlodipine effect. DC  florinef - 5/15: Much improved;  DC lisinopril; Dc amlodipine this PM if remains normotensive 5/16: Diastolic remains elevated increase norvasc to 10 mg daily 5/17 controlled overall, continue current  5/18- 5/20- BP controlled- con't regimen  15. L eye pain/pressure: Getting MRI brain and orbits w/o contrast d/t worsening symptoms and poor EOMI tolerance; should have been improving with decreased postop edema   - 5/16: MRI converted to CT d/t clips likely to obscure relevant areas. Found "Minimal hazy asymmetric stranding within the right intraorbitalfat, suspected to reflect a degree of passive venous congestion due to the evolving process within the right cerebral hemisphere." No focal s/s cellulitis, but will get CBC today for eval. No management changes, reassured patient and family.   5-20: Ongoing, Fioricet as above.  Labs appear appropriate, no concern for infection.  5/21: Adjusting pain regimen as above.  5/22: EOMI tolerance improving. Monitor.   16. Itching, generalized  -5/17 No rash noted, may be dry skin related, start moisturizer, continue benadryl crem      LOS: 15 days A FACE TO FACE EVALUATION WAS PERFORMED  Angelina Sheriff 07/06/2022, 8:33 AM

## 2022-07-06 NOTE — Progress Notes (Signed)
Pt refused labs this morning.  

## 2022-07-06 NOTE — Progress Notes (Signed)
Occupational Therapy Session Note  Patient Details  Name: Kristin Simmons MRN: [id number] Date of Birth: May 06, 1985  Today's Date: 07/06/2022 OT Individual Time: 1450-1530 OT Individual Time Calculation (min): 40 min    Short Term Goals: Week 2:  OT Short Term Goal 1 (Week 2): Patient will demonstrate improved awarenress of L hemi body by positioning L UE prior to transfer OT Short Term Goal 2 (Week 2): Patient will complete 1 step of LB dressing task OT Short Term Goal 3 (Week 2): Patient will dress L UE first with mod A  Skilled Therapeutic Interventions/Progress Updates:  Skilled OT intervention completed with focus on upright tolerance, self-care and functional transfer. Pt received reclined in TIS chair, expressed fatigue but agreeable to session. R eye pain reported; nurse made aware of request however not due for meds. OT offered rest breaks and repositioning throughout for pain reduction.  Pt declined going to therapy gym but through conversation verbalized wanting to fix her hair for comfort with use of helmet. With TIS chair at 90 degrees, pt participated in combing hair on R side. OT then completed L side and braided hair total A. Pt was not oriented to place, thinking she was in [place] with min A needed to orient to [place]. Donned helmet with min A for strap.  Onset of increased R eye pain, with request to return to bed. Declined all toileting needs. L AFO already on. Completed min A sit > stand using L HHA to EOB with min cues needed for weight shifting, and min A for advancing LLE back to EOB prior to sitting. Light min A transition to supine for LLE only. Doffed helmet/AFO with total A. Issued pt R eye patch per request due to light sensitivity.  Pt remained semi upright in bed, with bed alarm on/activated, and with all needs in reach at end of session.   Therapy Documentation Precautions:  Precautions Precautions: Fall Precaution Comments: R craniectomy, no bone  flap, bone flap placed in abdomen, R hand mitt, coretrak, flexiseal Restrictions Weight Bearing Restrictions: No    Therapy/Group: Individual Therapy  Melvyn Novas, MS, OTR/L  07/06/2022, 3:40 PM

## 2022-07-06 NOTE — Progress Notes (Signed)
Physical Therapy Session Note  Patient Details  Name: Kristin Simmons MRN: [id number] Date of Birth: 1985/03/10  Today's Date: 07/06/2022 PT Missed Time: 60 Minutes Missed Time Reason: Patient fatigue  Short Term Goals: Week 1:  PT Short Term Goal 1 (Week 1): Patient will perform bed mobility with MinA PT Short Term Goal 1 - Progress (Week 1): Met PT Short Term Goal 2 (Week 1): Patient will perform bed/chair transfer with ModA x1 PT Short Term Goal 2 - Progress (Week 1): Met PT Short Term Goal 3 (Week 1): Patient will gait train x50' with LRAD and MaxA x1 PT Short Term Goal 3 - Progress (Week 1): Met PT Short Term Goal 4 (Week 1): Patient will be able to tolerate sitting upright in TIS wheelchair between treatment sessions PT Short Term Goal 4 - Progress (Week 1): Met Week 2:  PT Short Term Goal 1 (Week 2): Patient will complete bed/chair transfer with MinA PT Short Term Goal 2 (Week 2): Patient will ambulate x50' with LRAD and ModA x1 PT Short Term Goal 3 (Week 2): Patient will initiate stair training  Skilled Therapeutic Interventions/Progress Updates:   Received pt semi-reclined in bed asleep. Pt lethargic and did not open eyes throughout entire interaction. Provided verbal and tactile stimuli and encouraged pt to participate, however pt proceeded to shake head and mumble "no" when therapist encouraged getting OOB. Notified RN and RN arrived to attempt to encourage pt with OOB mobility - pt initially agreed and proceeded to move RLE to EOB, but then refused to sit up and would not open eyes or initiate moving. Pt left semi-reclined in bed with all needs met, will attempt to make up time as able. 60 minutes missed of skilled physical therapy due to fatigue.   Therapy Documentation Precautions:  Precautions Precautions: Fall Precaution Comments: R craniectomy, no bone flap, bone flap placed in abdomen, R hand mitt, coretrak, flexiseal Restrictions Weight Bearing Restrictions:  No  Therapy/Group: Individual Therapy Marlana Salvage Zaunegger Blima Rich PT, DPT 07/06/2022, 7:18 AM

## 2022-07-06 NOTE — Progress Notes (Signed)
Occupational Therapy Session Note  Patient Details  Name: Kristin Simmons MRN: [id number] Date of Birth: September 23, 1985  Today's Date: 07/06/2022 OT Individual Time: 1334-1400 OT Individual Time Calculation (min): 26 min  and Today's Date: 07/06/2022 OT Missed Time: 15 Minutes Missed Time Reason: Patient unwilling/refused to participate without medical reason/ fatigue    Short Term Goals: Week 2:  OT Short Term Goal 1 (Week 2): Patient will demonstrate improved awarenress of L hemi body by positioning L UE prior to transfer OT Short Term Goal 2 (Week 2): Patient will complete 1 step of LB dressing task OT Short Term Goal 3 (Week 2): Patient will dress L UE first with mod A  Skilled Therapeutic Interventions/Progress Updates:    Pt received supine with no c/o pain, initially declining participation. Occasional inappropriate comments to rehab tech but all very redirectable. Pt required max cueing to come to EOB. B shoes and AFO donned with max A. She came to EOB with min A. She stood with min A from EOB. Min-mod A +2 B HHA for 150 ft of functional mobility. Seated rest break. Pt occasionally asking for a cigarette but redirectable. She completed another 150 ft of functional mobility back to her room. She was agreeable to stay OOB in w/c. She was left sitting in the TIS w/c with all needs met- seat belt on, chair alarm on and chair reclined. 15 min missed d/t fatigue/refusal.   Therapy Documentation Precautions:  Precautions Precautions: Fall Precaution Comments: R craniectomy, no bone flap, bone flap placed in abdomen, R hand mitt, coretrak, flexiseal Restrictions Weight Bearing Restrictions: No    Therapy/Group: Individual Therapy  Crissie Reese 07/06/2022, 6:18 AM

## 2022-07-06 NOTE — Progress Notes (Signed)
Patient ID: Kristin Simmons, female   DOB: [birth year], 36 y.o.   MRN: [id number]  SW went by room to see if CAP/DA application is in room but did not see.  1435- SW made contact with pt mother felicia to discuss above. Reports that she was not able to come by as she had to sleep  from working third shift. SW asked her to call SW when she leaves packet so SW can get completed. SW provided updates from team conference on gains made, d/c date remains 6/5, and continued physical assistance pt will require. SW will provide updates as available.   Cecile Sheerer, MSW, LCSWA Office: [phone] Cell: [phone] Fax: [phone]

## 2022-07-07 NOTE — Progress Notes (Signed)
Physical Therapy Weekly Progress Note  Patient Details  Name: Kristin Simmons MRN: [id number] Date of Birth: [birth year]  Beginning of progress report period: Jun 22, 2022 End of progress report period: Jul 07, 2022  Today's Date: 07/07/2022 PT Individual Time: 1045-1200 PT Individual Time Calculation (min): 75 min   Patient has met 3 of 3 short term goals. Patient continues to make progress toward she STGs and LTGs since initial evaluation. Currently, the patient requires CGA/MinA for all functional mobility with the use of a hemi-walker. Patient is gradually getting functional return of L LE, however continues to demonstrate weak quadriceps and posterior chain muscles.   Patient continues to demonstrate the following deficits muscle weakness, decreased cardiorespiratoy endurance, impaired timing and sequencing, decreased coordination, and decreased motor planning, decreased visual perceptual skills, decreased midline orientation and decreased attention to left, decreased attention, decreased awareness, decreased problem solving, decreased safety awareness, and decreased memory, impaired/absent sensation of L UE/LE, and decreased sitting balance, decreased standing balance, decreased postural control, hemiplegia, and decreased balance strategies and therefore will continue to benefit from skilled PT intervention to increase functional independence with mobility.  Patient progressing toward long term goals..  Plan of care revisions: Some goals were upgraded from MinA to CGA in order to more accurately reflect patient's CLOF.  PT Short Term Goals Week 1:  PT Short Term Goal 1 (Week 1): Patient will perform bed mobility with MinA PT Short Term Goal 1 - Progress (Week 1): Met PT Short Term Goal 2 (Week 1): Patient will perform bed/chair transfer with ModA x1 PT Short Term Goal 2 - Progress (Week 1): Met PT Short Term Goal 3 (Week 1): Patient will gait train x50' with LRAD and MaxA x1 PT Short  Term Goal 3 - Progress (Week 1): Met PT Short Term Goal 4 (Week 1): Patient will be able to tolerate sitting upright in TIS wheelchair between treatment sessions PT Short Term Goal 4 - Progress (Week 1): Met Week 2:  PT Short Term Goal 1 (Week 2): Patient will complete bed/chair transfer with MinA PT Short Term Goal 1 - Progress (Week 2): Met PT Short Term Goal 2 (Week 2): Patient will ambulate x50' with LRAD and ModA x1 PT Short Term Goal 2 - Progress (Week 2): Met PT Short Term Goal 3 (Week 2): Patient will initiate stair training PT Short Term Goal 3 - Progress (Week 2): Met Week 3:  PT Short Term Goal 1 (Week 3): STG=LTGs secondary to ELOS  Skilled Therapeutic Interventions/Progress Updates:  Patient greeted sitting upright in TIS wheelchair in room and agreeable to PT treatment session. Patient wheeled to day room for time management.   Patient gait trained x180' with R HHA from rehab tech and MinA from therapist for L LE management in stance phase of gait. Patient continues to require multimodal cues for improved R lateral lean during L swing phase of gait for improved foot clearance and more adequate heel strike upon initial contact.   Patient gait trained x180' and x90' with HW and MinA from therapist for improved L LE management and to facilitate Rt lateral weight shifting. Multimodal cues for improved sequencing throughout gait trial and required standing rest breaks in order to re-group and improve overall gait mechanics.   Patient performed 2 x 8 R foot taps to 4" block with HW and therapist providing MinA and managing L LE throughout stance phase for improved TKE and L hip extension.   Patient performed 2 x 5 L foot  taps to 4" block with HW and therapist providing MinA and managing L LE when placing foot off the step secondary to weak glutes. Multimodal cues for improved R lateral weight shift throughout activity with good improvements noted with increased repetition. Mirror placed  in front for external visual cues regarding posturing of hips and to reduce L hip rotation.   Patient stood without UE support and hit a ball back and forth with R UE with another therapist while primary therapist managed L LE throughout activity- Patient performed 2 x 20.   Patient left sitting in TIS wheelchair in room with seat belt on, posey belt on, call bell within reach, tray table in front and all needs met.    Therapy Documentation Precautions:  Precautions Precautions: Fall Precaution Comments: R craniectomy, no bone flap, bone flap placed in abdomen, R hand mitt, coretrak, flexiseal Restrictions Weight Bearing Restrictions: No  Pain: Rt shoulder pain which improved with ACE wrap and positioning.    Therapy/Group: Individual Therapy     07/07/2022, 7:54 AM

## 2022-07-07 NOTE — Progress Notes (Signed)
Occupational Therapy Weekly Progress Note  Patient Details  Name: Kristin Simmons MRN: [id number] Date of Birth: [birth year]  Beginning of progress report period: Jun 22, 2022 End of progress report period: Jul 07, 2022  Today's Date: 07/07/2022 OT Individual Time: 4098-1191 OT Individual Time Calculation (min): 70 min    Patient has met 3 of 3 short term goals.  Patient is making steady progress towards OT goals. She is currently at a min to mod A level for BADL tasks. Pt has some improved grasp with L hand. Continue current POC.  Patient continues to demonstrate the following deficits: muscle weakness, decreased cardiorespiratoy endurance, impaired timing and sequencing, abnormal tone, unbalanced muscle activation, decreased coordination, and decreased motor planning, decreased midline orientation, decreased attention to left, and decreased motor planning, decreased initiation, decreased attention, decreased awareness, decreased problem solving, decreased safety awareness, decreased memory, and delayed processing, and decreased sitting balance, decreased standing balance, decreased postural control, hemiplegia, and decreased balance strategies and therefore will continue to benefit from skilled OT intervention to enhance overall performance with BADL and Reduce care partner burden.  Patient progressing toward long term goals..  Continue plan of care.  OT Short Term Goals Week 1:  OT Short Term Goal 1 (Week 1): Pt will sit EOM with MIN-MOD A for 5 min during funcitoanl task in prep for BADL OT Short Term Goal 1 - Progress (Week 1): Met OT Short Term Goal 2 (Week 1): Pt will locate items on L of sink with mod cuing OT Short Term Goal 2 - Progress (Week 1): Met OT Short Term Goal 3 (Week 1): Pt will verbalize 1 step of hemi dressing technique OT Short Term Goal 3 - Progress (Week 1): Met OT Short Term Goal 4 (Week 1): pt will demo improved awareness/decreased impulsivity by waiting for OT to  transfer wiht no more than min cuing throughout session OT Short Term Goal 4 - Progress (Week 1): Met Week 2:  OT Short Term Goal 1 (Week 2): Patient will demonstrate improved awarenress of L hemi body by positioning L UE prior to transfer OT Short Term Goal 1 - Progress (Week 2): Met OT Short Term Goal 2 (Week 2): Patient will complete 1 step of LB dressing task OT Short Term Goal 2 - Progress (Week 2): Met OT Short Term Goal 3 (Week 2): Patient will dress L UE first with mod A OT Short Term Goal 3 - Progress (Week 2): Met  Skilled Therapeutic Interventions/Progress Updates:    Patient greeted asleep in bed, easy to wake and agreeable to OT treatment session> mod A to for bed mobility, then min A with stedy for transfer to [facility] over toilet and shower. Pt voided bladder and needed mod A for clothing management and peri-care.  Bathing completed from roll in shower chair using chair cut-out to wash peri-area and buttocks. Hand over hand A to integrate L UE into bathing tasks for neuro re-ed. PT did not recall hemi dressing strategies requiring mod cues initially. Mod A for LB dressing and mod A for UB. OT educated on one handed strategies for toothbrushing task. Pt left seated in TIS wc with L UE support on 1/2 lap tray.  OT placed SAEBO e-stim. On wrist extensors. SAEBO left on for 60 minutes. OT returned to remove SAEBO with skin intact and no adverse reactions.  [place] Stim One 330 pulse width 35 Hz pulse rate On 8 sec/ off 8 sec Ramp up/ down 2 sec Symmetrical Biphasic wave  form  Max intensity at 500 Ohm load  Pt left seated in TIS wc with wc alarm on, and nurse tech to set-up breakfast and provide supervision   Therapy Documentation Precautions:  Precautions Precautions: Fall Precaution Comments: R craniectomy, no bone flap, bone flap placed in abdomen, R hand mitt, coretrak, flexiseal Restrictions Weight Bearing Restrictions: No Pain: Pain Assessment Pain Scale: 0-10 Pain  Score: 0-No pain  Therapy/Group: Individual Therapy  Mal Amabile 07/07/2022, 1:54 PM

## 2022-07-07 NOTE — Progress Notes (Signed)
Discussed about importance of calling staff if needing assistance throughout the night and not to exit bed without staff assistance.  Continues to endorse headache throughout the evening. Did report earlier in the day feeling dizzy but not at this time. No change with vision is reported.  Continues to endorse "tearing" sensation with lower right quadrant of abdomen. Inferior to incision site on right side palpated small hard lump where patient acknowledges sensation of pain is. No further pain reported in any additional quadrants of abdomen.  Continues to endorse being "itchy" with her back.  On patient's neck does have a barcode tattoo with no numbers observed.

## 2022-07-07 NOTE — Plan of Care (Signed)
Goals upgraded in order to more accurately reflect patient's CLOF- SR 5/23  Problem: RH Bed Mobility Goal: LTG Patient will perform bed mobility with assist (PT) Description: LTG: Patient will perform bed mobility with assistance, with/without cues (PT). Flowsheets (Taken 07/07/2022 1346) LTG: Pt will perform bed mobility with assistance level of: (SR 5/23) Supervision/Verbal cueing Note: Goal upgraded to more accurately reflect patient's CLOF- SR 5/23   Problem: RH Bed to Chair Transfers Goal: LTG Patient will perform bed/chair transfers w/assist (PT) Description: LTG: Patient will perform bed to chair transfers with assistance (PT). Flowsheets (Taken 07/07/2022 1346) LTG: Pt will perform Bed to Chair Transfers with assistance level: (SR 5/23) Contact Guard/Touching assist Note: Goal upgraded to more accurately reflect patient's CLOF- SR 5/23   Problem: RH Car Transfers Goal: LTG Patient will perform car transfers with assist (PT) Description: LTG: Patient will perform car transfers with assistance (PT). Flowsheets (Taken 07/07/2022 1346) LTG: Pt will perform car transfers with assist:: (SR 5/23) Contact Guard/Touching assist Note: Goal upgraded to more accurately reflect patient's CLOF- SR 5/23   Problem: RH Ambulation Goal: LTG Patient will ambulate in home environment (PT) Description: LTG: Patient will ambulate in home environment, # of feet with assistance (PT). Flowsheets (Taken 07/07/2022 1346) LTG: Pt will ambulate in home environ  assist needed:: (SR 5/23) Contact Guard/Touching assist Note: Goal upgraded to more accurately reflect patient's CLOF- SR 5/23 Goal: LTG Patient will ambulate in community environment (PT) Description: LTG: Patient will ambulate in community environment, # of feet with assistance (PT). Flowsheets (Taken 07/07/2022 1346) LTG: Pt will ambulate in community environ  assist needed:: (SR 5/23) Contact Guard/Touching assist Note: Goal upgraded to more  accurately reflect patient's CLOF- SR 5/23

## 2022-07-07 NOTE — Progress Notes (Signed)
Physical Therapy Session Note  Patient Details  Name: Kristin Simmons MRN: [id number] Date of Birth: 04-05-1985  Today's Date: 07/07/2022 PT Individual Time: 1420-1529 PT Individual Time Calculation (min): 69 min   Short Term Goals: Week 3:  PT Short Term Goal 1 (Week 3): STG=LTGs secondary to ELOS  Skilled Therapeutic Interventions/Progress Updates:     Pt received supine in bed and agrees to therapy. Reports pain in L arm. PT provides bracing and repositioning to manage pain, as well as alerting RN. Pt performs supine to sit with minA and cues for positioning. PT provides totalA to don shoes at EOB with L AFO. Pt performs stand pivot to [facility] with minA and cues for initiation, anterior weight shift, positioning, and sequencing. WC transport to gym. Pt participates in dance activity with other patients to provide community integration, combined with rhythmic movements of upper and lower extremities for coordination training, as well as endurance. PT provides AAROM for LLE and verbal encouragement throughout to participate. WC transport back to room. Stand pivot to bd with same assistance. Left supine in bed with all needs within reach.   Therapy Documentation Precautions:  Precautions Precautions: Fall Precaution Comments: R craniectomy, no bone flap, bone flap placed in abdomen, R hand mitt, coretrak, flexiseal Restrictions Weight Bearing Restrictions: No   Therapy/Group: Individual Therapy  Beau Fanny, PT, DPT 07/07/2022, 5:00 PM

## 2022-07-07 NOTE — Progress Notes (Signed)
PROGRESS NOTE   Subjective/Complaints: No events overnight. Patient seen in room, working with nurses aide.  She states she is doing better than yesterday.  No more discomfort in the left upper extremity overnight with increase gabapentin.    Does have ongoing tearing sensation in right lower quadrant over bone flap site, especially with twisting and moving.  She states vision is improving, along with headache.  No PRN baclofen use. No oxycodone since 5/22 AM.    ROS:  + R eye pressure/pain -improving + R shoulder/neck pain/arm pain-improved + Lethargy/insomnia - improved + RLQ tightness - ongoing +Spasms - worse at nighttime  Denies fevers, chills, N/V, abdominal pain, constipation, diarrhea, SOB, cough, chest pain, new weakness or paraesthesias.     Objective:   No results found. Recent Labs    07/05/22 1226  WBC 4.3  HGB 13.8  HCT 42.0  PLT 258    Recent Labs    07/05/22 1506  NA 137  K 4.2  CL 102  CO2 23  GLUCOSE 90  BUN 12  CREATININE 0.75  CALCIUM 10.1     Intake/Output Summary (Last 24 hours) at 07/07/2022 0858 Last data filed at 07/06/2022 1854 Gross per 24 hour  Intake 240 ml  Output --  Net 240 ml         Physical Exam: Vital Signs Blood pressure 106/69, pulse 89, temperature 98.2 F (36.8 C), resp. rate 19, height 5\' 4"  (1.626 m), weight 51.2 kg, SpO2 95 %. Constitutional: No acute distress, sitting up in bed eating lunch HENT: No JVD. Neck Supple. Trachea midline. + R craniotomy  eyes: PERRLA. EOMI grossly intact -intermittently wearing eye patch for comfort, but can tolerate full EOMI in both directions with eyes open  cardiovascular: RRR.  No murmurs, rubs, gallops. Respiratory: CTA B/L no W/R/R Abdomen: soft, NT, ND, (+) hyperactive BS since eating GU: Not examined.   Skin: R craniectomy and R abdominal surgical sites - healing,  CDI.   + Bilateral periorbital hematoma -  resolving; now only present on right  MSK:     + TTP L shoulder, R lateral orbit and temple below defect - unchanged    + TTP right lower quadrant/flank just lateral to bone flap site      Strength:                RUE: 5/5 SA, 5/5 EF, 5/5 EE, 5/5 WE, 5/5 FF, 5/5 FA                 LUE:  2/5 FF, 1/5 FA, 2/5 EF, 2/5 EE                 RLE: 5/5 HF, 5/5 KE, 5/5 DF, 5/5 EHL, 5/5 PF                 LLE:  3/5 KE, +LLE AFO, 2/5 toe flexion/extension  Neurologic exam:  Cognition: AAO to person, place, month and year Language: Fluent, No substitutions or neoglisms. No dysarthria.  + Some mild impulsivity , disinhibition -overall improved + Perseveration on bone flap in abdomen, headache-ongoing Insight: Poor insight into current condition. -Improving Mood: Flat affect, alert,  no depression/anxiety Sensation: Altered sensation to light touch in left upper extremity and left lower extremity -no longer painful or hypersensitive-ongoing CN: + L tongue deviation - minimal    Assessment/Plan: 1. Functional deficits which require 3+ hours per day of interdisciplinary therapy in a comprehensive inpatient rehab setting. Physiatrist is providing close team supervision and 24 hour management of active medical problems listed below. Physiatrist and rehab team continue to assess barriers to discharge/monitor patient progress toward functional and medical goals  Care Tool:  Bathing    Body parts bathed by patient: Left arm, Chest, Abdomen, Right upper leg   Body parts bathed by helper: Right arm, Front perineal area, Buttocks, Left upper leg, Right lower leg, Left lower leg, Face     Bathing assist Assist Level: Moderate Assistance - Patient 50 - 74%     Upper Body Dressing/Undressing Upper body dressing   What is the patient wearing?: Pull over shirt    Upper body assist Assist Level: Moderate Assistance - Patient 50 - 74%    Lower Body Dressing/Undressing Lower body dressing      What is  the patient wearing?: Pants     Lower body assist Assist for lower body dressing: Maximal Assistance - Patient 25 - 49%     Toileting Toileting    Toileting assist Assist for toileting: Maximal Assistance - Patient 25 - 49%     Transfers Chair/bed transfer  Transfers assist  Chair/bed transfer activity did not occur: Safety/medical concerns  Chair/bed transfer assist level: Moderate Assistance - Patient 50 - 74%     Locomotion Ambulation   Ambulation assist      Assist level: Maximal Assistance - Patient 25 - 49% Assistive device: Orthosis Max distance: 50   Walk 10 feet activity   Assist     Assist level: Maximal Assistance - Patient 25 - 49% Assistive device: Orthosis, Walker-hemi   Walk 50 feet activity   Assist Walk 50 feet with 2 turns activity did not occur: Safety/medical concerns (Patient unable to ambulate >30' at this time secondary to fatigue, poor endurance/activity tolerance, impaired attention, increased pain, etc.)  Assist level: Maximal Assistance - Patient 25 - 49% Assistive device: Walker-hemi    Walk 150 feet activity   Assist Walk 150 feet activity did not occur: Safety/medical concerns         Walk 10 feet on uneven surface  activity   Assist Walk 10 feet on uneven surfaces activity did not occur: Safety/medical concerns         Wheelchair     Assist Is the patient using a wheelchair?: Yes Type of Wheelchair: Manual (per therapist, pt using TIS/Recline back wheelchair)    Wheelchair assist level: Dependent - Patient 0% Max wheelchair distance: 150'    Wheelchair 50 feet with 2 turns activity    Assist        Assist Level: Dependent - Patient 0%   Wheelchair 150 feet activity     Assist      Assist Level: Dependent - Patient 0%   Blood pressure 106/69, pulse 89, temperature 98.2 F (36.8 C), resp. rate 19, height 5\' 4"  (1.626 m), weight 51.2 kg, SpO2 95 %.  1. 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 Conchita Paris. Bone flap in abdominal pocket, plan for subacute flap replacement after IPR             -  patient may  shower if cover incisions             -ELOS/Goals: 6/5 goal DC date; supervision to min A   Safety precautions due to poor impulse control - Helmet ordered for OOB 5/8; WHO and [facility] 5/9  Con't CIR PT, OT and SLP   - 5/21: AFO order   - 5/14: Starting to advance L leg with some buckling, Min A goals seem appropriate. SLP attention, recall improving but family is triggering and she has behaviors when family comes in during therapies.   5/15: CT head showing improved edema, small SDH, and unchanged 8 mm midline shift  5/21: poor L attention but getting some functional grasp in LUE; L lateral lean complicating EOB balance; CGA-Min A transfer and Min-Mod transfers mostly c/b poor safety awareness. Walking up to 180 ft with handheld assist, can advance L leg but gets buckling in L knee  despite anterior support AFO.   5/23: Less impulsive over past week, will discuss with therapies and nursing today removing TeleSitter  2 Antithrombotics: -DVT/anticoagulation:  Pharmaceutical: Lovenox initiated 07/04/2022             -antiplatelet therapy: N/A  3. Pain Management/neuropathic pain: Oxycodone as needed  5/8: added lidocaine patch x2 to L neck, shoulder  5/9: Add gabapentin 300 mg TID for LUE and LLE pain, likely related to spasticity and neuropathic.   5/10: Mild lethargy today but no use of PRN pain medications; decrease gabapentin to 100 mg TID  5/11: kpad and left shoulder sling ordered  5/23: Using oxycodone Q4H; continue  5/14: Using tylenol and oxycodone together consistently; schedule tylenol 1000 mg TID and oxycodone 5 mg TID; add oxycodone 5 mg BID Prn for breakthrough  -- Increase gabapentin to 200/200/400 mg daily  5/16: Increase gabapentin  to 200/200/600 mg for pain and sleep  5/17 try voltaren gel L shoulder  5/18-5/19- denies pain this AM  5/20: Due to no improvement with prior measures, discontinue daytime gabapentin, standing Tylenol, and standing oxycodone.  Changed headache regimen to Fioricet 1 tab 4 times daily as needed, and oxycodone 5 mg 4 times daily as needed for pain.  5/21 d/t daytime lethargy + poor pain control, change gabapentin 600 mg QHS to 200 mg TID. DC fioricet, resume scheduled tylenol 1000 mg TID. Continue PRN oxy  5/22: increase QHS gabapentin to 400 mg given increased sensitivity/pain after cutting back from 600 qhs. Add baclofen 5 mg TID PRN for spasms -improved pain control, continue this regimen, encouraged use of as needed baclofen for abdominal pain  4. Mood/Behavior/Sleep: PRN twice daily as needed provide emotional support             -antipsychotic agents: N/A  - Sleep log added - poor sleep 3 hours last night. Add melatonin 5 mg QHS + PRN trazodone 50-75 mg.   5/10: Slept 7 hours overnight, still endorsing poor sleep.  Did not use as needed trazodone, will add 50 mg standing nightly.  5/11: continue trazodone  5/13: Per log slept 5 hours overnight, however was not updated 5-10: Will recheck tomorrow  5.14: slept 7 hours overnight; see above for increased QHS gabapentin  5/15: s sleep log not completed overnight, informed nursing.  Patient endorses ongoing poor sleep, no improvement with increase gabapentin.  5/16: interrupted sleep 7+ hours; increase trazodone to 75 mg and increase gabapentin as above  5/18-20 sleeping better  5/21: Worsening lethargy per therapies over last 2 days; possible etiologies include  medication induced, poor pain control, and metabolic/hyponatremia. DC fioricet, decrease nighttime gabapentin, stat CBC and BMP ordered. Sleep log remains interrupted but appropriate.   5/22: CBC, BMP WNL. Re-time sleep medications to 2000. Increase gabapentin QHS as above and Trazadone to  100 mg QHS.   5. Neuropsych/cognition: This patient is not capable of making decisions on her own behalf -not consistently oriented, impulsive with poor awareness..   - Obs for safety  5/19- con't Telesitter and NT for meals  6. Skin/Wound Care: Routine skin checks  - 5/8: 3 weeks post-R craniectomy; ordered staple removal from scalp and abdomen. no apparent wounds will DC rectal tube    7. Dysphagia: placed order for Cortrak removal 5/12 Diet Orders (From admission, onward)     Start     Ordered   07/04/22 1303  DIET DYS 3 Room service appropriate? Yes with Assist; Fluid consistency: Thin; Fluid restriction: 1200 mL Fluid  Diet effective now       Comments: Meds whole with liquid; full supervision with meals  Question Answer Comment  Room service appropriate? Yes with Assist   Fluid consistency: Thin   Fluid restriction: 1200 mL Fluid      07/04/22 1302          5/13: Monitor POs with cortrak removal 5/18-22 eating well    8.  Hyponatremia.  Continue sodium chloride tablets.  Florinef 0.3 mg 3 times daily  - 5/8: Na 134 this AM. Downtrending over last 2 days. No obvious medication contributors; Already on salt tabs 2g TID. Placed 1500 ml fluid restriction, repeat today 138. Ordered daily BMP x3 days for monitoring. Reduce to Florinef 0.1 mg TID and monitor.   5/9: Na stable 13. Diastolic BP improved, remains high systolic. Orthostatic vitals negative with BP. Given florinef indication is hyponatremia not orthostasis will continue 0.1 mg TID through Friday then DC if no significant drop in Na.   5-10: Na down to 135 with mildly increased lethargy.  Increase Florinef to 0.2 mg 3 times daily, continue daily BMP for 3 days.  5/12: Na improved to 136, decrease Florinef to 0.1mg  TID given hypertension  5/13: NA 136 with decreased Florinef; reduced dosing to twice daily and recheck BMP Wednesday  5/14: DC florinef d/t persistent hypertension; labs pending  5/16: Na stable; will check  today, then reduce Na starting tomorrow if appropriate with repeat labs Sunday  5/18- will recheck labs in AM/Sunday- ordered labs- will also reduce Na levels to 2G BID with meals from TID  5/19- Na 138, but reduction hasn't shown up yet, since got lunch NA yesterday- will recheck this week 5/20: NA low 134 with reduction, resume 2 g 3 times daily dosing and recheck on Wednesday 5/21: repeat BMP today stat d/t lethergy - NA 137 - stable; refused recheck 5/22; order for 5-24  10.  Decreased nutritional storage.  Currently on a dysphagia #3 diet.  NGT d/ced   - 5/14: DC SSI, CBG  11.  Urinary retention.  Urecholine 5 mg 3 times daily.  Check PVR - resolved  5/8: No PVRs overnight; ordered. Mixed continence overnight.   5/9: Continue UO and low PVRs. DC urecholine.   5/10: PVRs remain low, continent.  Continue for 24 hours, then DC PVRs.  5/19- PVRs off- resolved  12.  History of tobacco/alcohol use.  Counseling  13. Diarrhea. Remove rectal tube 5/8. Increase banatrol to TID.     - 5/9: No recorded Bms since rectal tube removal; reduce banatrol  back to BID, obtain KUB  - 5/10: DC banatrol. KUB with mild stool burden.  If no BM today, will need to start laxatives.   5/11: magnesium citrate ordered  5/12: flowsheet reviewd and had type 5 stools today.   5/13: soft stools, last BM 5/12, montior  5/15: Liquid Bms 5/13; now on PO diet, monitor  5/16: add colace 100 mg BID + miralax daily PRN  5/17 schedule miralax  5/18- LBM x2   5/22: change colace to sennakot s 1 tab BID  5-23: Large BM this a.m.; monitor  14. HTN: magnesium citrate ordered, likely elevated due to Florinef, decrease to 1 tab TID    07/07/2022    5:25 AM 07/06/2022    9:20 PM 07/06/2022    6:17 PM  Vitals with BMI  Systolic 106 114 269  Diastolic 69 80 82  Pulse 89 85 82    - 5/13: PRN hydralazine used 1x overnight; BP remains significantly elevated with decreased Florinef.  Add amlodipine 5 mg daily.  - 5/14:  diastolic remains elevated; add lisinopril 5 mg daily. Will take 2-3 days for amlodipine effect. DC florinef - 5/15: Much improved;  DC lisinopril; Dc amlodipine this PM if remains normotensive 5/16: Diastolic remains elevated increase norvasc to 10 mg daily 5/17 controlled overall, continue current  5/18- 5/20- BP controlled- con't regimen  15. L eye pain/pressure: Getting MRI brain and orbits w/o contrast d/t worsening symptoms and poor EOMI tolerance; should have been improving with decreased postop edema   - 5/16: MRI converted to CT d/t clips likely to obscure relevant areas. Found "Minimal hazy asymmetric stranding within the right intraorbitalfat, suspected to reflect a degree of passive venous congestion due to the evolving process within the right cerebral hemisphere." No focal s/s cellulitis, but will get CBC today for eval. No management changes, reassured patient and family.   5-20: Ongoing, Fioricet as above.  Labs appear appropriate, no concern for infection.  5/21: Adjusting pain regimen as above.  5/22: EOMI tolerance improving. Monitor.   16. Itching, generalized  -5/17 No rash noted, may be dry skin related, start moisturizer, continue benadryl crem      LOS: 16 days A FACE TO FACE EVALUATION WAS PERFORMED  Angelina Sheriff 07/07/2022, 8:58 AM

## 2022-07-07 NOTE — Progress Notes (Addendum)
Patient ID: Kristin Simmons, female   DOB: Mar 07, 1985, 37 y.o.   MRN: [id number]  SW received CAP/DA packet. SW called pt mother Kristin Simmons to inform it has been received.   SW faxed CAP/DA selection request for CAP/DA management entity to [id number] [phone].  Cecile Sheerer, MSW, LCSWA Office: [phone] Cell: [phone] Fax: [phone]

## 2022-07-08 MED ORDER — DIPHENHYDRAMINE-ZINC ACETATE 2-0.1 % EX CREA: TOPICAL_CREAM | Freq: Every day | CUTANEOUS | Status: DC

## 2022-07-08 MED ORDER — DIPHENHYDRAMINE-ZINC ACETATE 2-0.1 % EX CREA
TOPICAL_CREAM | Freq: Every day | CUTANEOUS | Status: DC
  Filled 2022-07-08: qty 28

## 2022-07-08 NOTE — Progress Notes (Signed)
Physical Therapy Session Note  Patient Details  Name: Kristin Simmons MRN: [id number] Date of Birth: 19-May-1985  Today's Date: 07/08/2022 PT Individual Time: 1st Treatment Session: 1000-1115; 2nd Treatment Session: 1345-1420 PT Individual Time Calculation (min): 75 min   Short Term Goals: Week 3:  PT Short Term Goal 1 (Week 3): STG=LTGs secondary to ELOS  Skilled Therapeutic Interventions/Progress Updates:  1st Treatment Session- Patient greeted supine in bed and agreeable to PT treatment session. Patient transitioned from supine to sitting EOB with supv- While sitting EOB, therapist donned tennis shoes and L anterior AFO for time management. Patient then wheeled to rehab gym for time management and energy conservation.   Patient gait trained x120' with HW and CGA/MinA for improved stability- VC for improved sequencing with HW, as well as forward gaze, postural extension and increased Rt LE step length with good improvements noted, however unable to sustain as patient becomes distracted easily.   Patient performed various sit/stands throughout treatment session with HW and CGA- VC for proper Rt hand placement with inconsistent carryover noted.   Patient performed various transfers throughout treatment session with HW and MinA- VC for not dragging Lt LE throughout transfer with good improvements noted.   Patient requesting to use the restroom and gait trained to/from bathroom ~50' each way with HW and MinA- Once in the restroom, patient was able to manage pants and underwear, as well as perform pericare all with CGA.   While seated in TIS wheelchair taking a rest break, patient requested therapist fix her hair- While fixing her hair, patient performed 2 x 10 Lt LAQ with 3 second hold for increased Lt LE strength.   Patient gait trained from day room to her room with HW and MinA overall- Multimodal cues for improved sequencing and not ambulating too fast and getting ahead of herself. Patient  left sitting in TIS wheelchair, reclined with call bell within reach, posey belt/seatbelt donned, call bell within reach and all needs met.    2nd Treatment Session- Patient greeted supine asleep in bed, but easily arousable and agreeable to PT treatment session. While supine therapist donned Saebo to Lt quadriceps in order to provide estim to improve overall strength and activation of muscles. Patient transitioned to sitting EOB with supv- While sitting EOB, therapist donned Lt anterior AFO and tennis shoes for time management. Patient stood from EOB without the use of an AD and CGA and then performed stand pivot transfer to TIS wheelchair with CGA and VC for improved Lt LE placement. Patient taken outside for treatment session for improved overall mood and affect- Discussed discharge planning, CLOF, projected level of function and all functional gains she's made so far since admission. Patient transitioned to OT care at end of treatment session with all needs met.   Saebo Stim One  330 pulse width 35 Hz pulse rate On 8 sec/ off 8 sec Ramp up/ down 2 sec Symmetrical Biphasic wave form  Max intensity at 500 Ohm load -Used for a total of 30 minutes on Lt quads.    Therapy Documentation Precautions:  Precautions Precautions: Fall Precaution Comments: R craniectomy, no bone flap, bone flap placed in abdomen, R hand mitt, coretrak, flexiseal Restrictions Weight Bearing Restrictions: No  Pain: No/Denies pain- Lt shoulder improved with sling throughout treatment session.   Therapy/Group: Individual Therapy     07/08/2022, 9:00 AM

## 2022-07-08 NOTE — Progress Notes (Signed)
PROGRESS NOTE   Subjective/Complaints: No events overnight. Was c/o pain in RLQ abdomen to night nursing, along with headache, dizziness, and itchiness in her back.   Today, she denies HA, states abdominal pain only present when twisting. Is c/o itching along scapl suture line. Sleeping well, vision improving.   ROS:  + R eye pressure/pain -improving + Lethargy/insomnia - improved + RLQ tightness/Spasms - ongoing +itching - incisional sites - ongoing  Denies fevers, chills, N/V, abdominal pain, constipation, diarrhea, SOB, cough, chest pain, new weakness or paraesthesias.     Objective:   No results found. Recent Labs    07/05/22 1226  WBC 4.3  HGB 13.8  HCT 42.0  PLT 258    Recent Labs    07/05/22 1506  NA 137  K 4.2  CL 102  CO2 23  GLUCOSE 90  BUN 12  CREATININE 0.75  CALCIUM 10.1     Intake/Output Summary (Last 24 hours) at 07/08/2022 0855 Last data filed at 07/07/2022 1256 Gross per 24 hour  Intake 236 ml  Output --  Net 236 ml         Physical Exam: Vital Signs Blood pressure (!) 130/92, pulse 94, temperature 97.8 F (36.6 C), temperature source Oral, resp. rate 20, height 5\' 4"  (1.626 m), weight 51.2 kg, SpO2 100 %. Constitutional: No acute distress, sitting up in bed eating lunch HENT: No JVD. Neck Supple. Trachea midline. + R craniotomy  eyes: PERRLA. Can tolerate full EOMI in both directions with eyes open, minimal R eye discomfort cardiovascular: RRR.  No murmurs, rubs, gallops. Respiratory: CTA B/L no W/R/R Abdomen: soft, NT, ND, (+) hyperactive BS since eating GU: Not examined.   Skin: R craniectomy and R abdominal surgical sites - healing,  CDI.   + Raccoon eyes - improving  MSK:     +TTP R lateral orbit and temple below defect - unchanged    + TTP right lower quadrant/flank just lateral to bone flap site - minimal, more positional      Strength:                RUE: 5/5 SA,  5/5 EF, 5/5 EE, 5/5 WE, 5/5 FF, 5/5 FA                 LUE:  2/5 FF, 1/5 FA, 2/5 EF, 2/5 EE                 RLE: 5/5 HF, 5/5 KE, 5/5 DF, 5/5 EHL, 5/5 PF                 LLE:  3/5 KE, +LLE AFO, 2/5 toe flexion/extension  Neurologic exam:  Cognition: AAO to person, place, month and year Language: Fluent, No substitutions or neoglisms. No dysarthria.  + Some mild impulsivity , disinhibition -overall improved + Perseveration on bone flap in abdomen, headache-ongoing Insight: Poor insight into current condition. -Improving Mood: Flat affect, alert, no depression/anxiety Sensation: Altered sensation to light touch in left upper extremity and left lower extremity -no longer painful or hypersensitive-ongoing CN: + L tongue deviation - minimal    Assessment/Plan: 1. Functional deficits which require 3+ hours per day  of interdisciplinary therapy in a comprehensive inpatient rehab setting. Physiatrist is providing close team supervision and 24 hour management of active medical problems listed below. Physiatrist and rehab team continue to assess barriers to discharge/monitor patient progress toward functional and medical goals  Care Tool:  Bathing    Body parts bathed by patient: Left arm, Chest, Abdomen, Right upper leg   Body parts bathed by helper: Right arm, Front perineal area, Buttocks, Left upper leg, Right lower leg, Left lower leg, Face     Bathing assist Assist Level: Moderate Assistance - Patient 50 - 74%     Upper Body Dressing/Undressing Upper body dressing   What is the patient wearing?: Pull over shirt    Upper body assist Assist Level: Moderate Assistance - Patient 50 - 74%    Lower Body Dressing/Undressing Lower body dressing      What is the patient wearing?: Pants     Lower body assist Assist for lower body dressing: Maximal Assistance - Patient 25 - 49%     Toileting Toileting    Toileting assist Assist for toileting: Maximal Assistance - Patient 25 -  49%     Transfers Chair/bed transfer  Transfers assist  Chair/bed transfer activity did not occur: Safety/medical concerns  Chair/bed transfer assist level: Moderate Assistance - Patient 50 - 74%     Locomotion Ambulation   Ambulation assist      Assist level: Maximal Assistance - Patient 25 - 49% Assistive device: Orthosis Max distance: 50   Walk 10 feet activity   Assist     Assist level: Maximal Assistance - Patient 25 - 49% Assistive device: Orthosis, Walker-hemi   Walk 50 feet activity   Assist Walk 50 feet with 2 turns activity did not occur: Safety/medical concerns (Patient unable to ambulate >30' at this time secondary to fatigue, poor endurance/activity tolerance, impaired attention, increased pain, etc.)  Assist level: Maximal Assistance - Patient 25 - 49% Assistive device: Walker-hemi    Walk 150 feet activity   Assist Walk 150 feet activity did not occur: Safety/medical concerns         Walk 10 feet on uneven surface  activity   Assist Walk 10 feet on uneven surfaces activity did not occur: Safety/medical concerns         Wheelchair     Assist Is the patient using a wheelchair?: Yes Type of Wheelchair: Manual (per therapist, pt using TIS/Recline back wheelchair)    Wheelchair assist level: Dependent - Patient 0% Max wheelchair distance: 150'    Wheelchair 50 feet with 2 turns activity    Assist        Assist Level: Dependent - Patient 0%   Wheelchair 150 feet activity     Assist      Assist Level: Dependent - Patient 0%   Blood pressure (!) 130/92, pulse 94, temperature 97.8 F (36.6 C), temperature source Oral, resp. rate 20, height 5\' 4"  (1.626 m), weight 51.2 kg, SpO2 100 %.  1. 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 Conchita Paris. Bone flap in abdominal pocket, plan for  subacute flap replacement after IPR             -patient may  shower if cover incisions             -ELOS/Goals: 6/5 goal DC date; supervision to min A   Safety precautions due to poor  impulse control - Helmet ordered for OOB 5/8; [facility] and [facility] 5/9  Con't CIR PT, OT and SLP   - 5/21: AFO order   - 5/14: Starting to advance L leg with some buckling, Min A goals seem appropriate. SLP attention, recall improving but family is triggering and she has behaviors when family comes in during therapies.   5/15: CT head showing improved edema, small SDH, and unchanged 8 mm midline shift  5/21: poor L attention but getting some functional grasp in LUE; L lateral lean complicating EOB balance; CGA-Min A transfer and Min-Mod transfers mostly c/b poor safety awareness. Walking up to 180 ft with handheld assist, can advance L leg but gets buckling in L knee  despite anterior support AFO.   5/23: Less impulsive over past week, will discuss with therapies and nursing today removing TeleSitter; Dced telesitter.   5/24: was somewhat impulsive overnight, needed reminders to stay in bed. Continue bed alarm and posey belt in WC.   2 Antithrombotics: -DVT/anticoagulation:  Pharmaceutical: Lovenox initiated 07/04/2022             -antiplatelet therapy: N/A  3. Pain Management/neuropathic pain: Oxycodone as needed  5/8: added lidocaine patch x2 to L neck, shoulder  5/9: Add gabapentin 300 mg TID for LUE and LLE pain, likely related to spasticity and neuropathic.   5/10: Mild lethargy today but no use of PRN pain medications; decrease gabapentin to 100 mg TID  5/11: kpad and left shoulder sling ordered  5/23: Using oxycodone Q4H; continue  5/14: Using tylenol and oxycodone together consistently; schedule tylenol 1000 mg TID and oxycodone 5 mg TID; add oxycodone 5 mg BID Prn for breakthrough  -- Increase gabapentin to 200/200/400 mg daily  5/16: Increase gabapentin to 200/200/600 mg for pain and sleep  5/17 try voltaren  gel L shoulder  5/18-5/19- denies pain this AM  5/20: Due to no improvement with prior measures, discontinue daytime gabapentin, standing Tylenol, and standing oxycodone.  Changed headache regimen to Fioricet 1 tab 4 times daily as needed, and oxycodone 5 mg 4 times daily as needed for pain.  5/21 d/t daytime lethargy + poor pain control, change gabapentin 600 mg QHS to 200 mg TID. DC fioricet, resume scheduled tylenol 1000 mg TID. Continue PRN oxy  5/22: increase QHS gabapentin to 400 mg given increased sensitivity/pain after cutting back from 600 qhs. Add baclofen 5 mg TID PRN for spasms -improved pain control, continue this regimen, encouraged use of as needed baclofen for abdominal pain   4. Mood/Behavior/Sleep: PRN twice daily as needed provide emotional support             -antipsychotic agents: N/A  - Sleep log added - poor sleep 3 hours last night. Add melatonin 5 mg QHS + PRN trazodone 50-75 mg.   5/10: Slept 7 hours overnight, still endorsing poor sleep.  Did not use as needed trazodone, will add 50 mg standing nightly.  5/11: continue trazodone  5/13: Per log slept 5 hours overnight, however was not updated 5-10: Will recheck tomorrow  5.14: slept 7 hours overnight; see above for increased QHS gabapentin  5/15: s sleep log not completed overnight, informed nursing.  Patient endorses ongoing poor sleep, no improvement with increase gabapentin.  5/16: interrupted sleep 7+ hours; increase trazodone to 75 mg and increase gabapentin as above  5/18-20 sleeping better  5/21: Worsening lethargy per therapies over last 2 days; possible etiologies include medication induced, poor pain control, and metabolic/hyponatremia. DC fioricet, decrease  nighttime gabapentin, stat CBC and BMP ordered. Sleep log remains interrupted but appropriate.   5/22: CBC, BMP WNL. Re-time sleep medications to 2000. Increase gabapentin QHS as above and Trazadone to 100 mg QHS.  - sleeping well, no further nighttime  pain  5. Neuropsych/cognition: This patient is not capable of making decisions on her own behalf -not consistently oriented, impulsive with poor awareness..   - Obs for safety  5/19- con't Telesitter and NT for meals  6. Skin/Wound Care: Routine skin checks  - 5/8: 3 weeks post-R craniectomy; ordered staple removal from scalp and abdomen. no apparent wounds will DC rectal tube    7. Dysphagia: placed order for Cortrak removal 5/12 Diet Orders (From admission, onward)     Start     Ordered   07/04/22 1303  DIET DYS 3 Room service appropriate? Yes with Assist; Fluid consistency: Thin; Fluid restriction: 1200 mL Fluid  Diet effective now       Comments: Meds whole with liquid; full supervision with meals  Question Answer Comment  Room service appropriate? Yes with Assist   Fluid consistency: Thin   Fluid restriction: 1200 mL Fluid      07/04/22 1302          5/13: Monitor POs with cortrak removal 5/18-22 eating well    8.  Hyponatremia.  Continue sodium chloride tablets.  Florinef 0.3 mg 3 times daily  - 5/8: Na 134 this AM. Downtrending over last 2 days. No obvious medication contributors; Already on salt tabs 2g TID. Placed 1500 ml fluid restriction, repeat today 138. Ordered daily BMP x3 days for monitoring. Reduce to Florinef 0.1 mg TID and monitor.   5/9: Na stable 13. Diastolic BP improved, remains high systolic. Orthostatic vitals negative with BP. Given florinef indication is hyponatremia not orthostasis will continue 0.1 mg TID through Friday then DC if no significant drop in Na.   5-10: Na down to 135 with mildly increased lethargy.  Increase Florinef to 0.2 mg 3 times daily, continue daily BMP for 3 days.  5/12: Na improved to 136, decrease Florinef to 0.1mg  TID given hypertension  5/13: NA 136 with decreased Florinef; reduced dosing to twice daily and recheck BMP Wednesday  5/14: DC florinef d/t persistent hypertension; labs pending  5/16: Na stable; will check today,  then reduce Na starting tomorrow if appropriate with repeat labs Sunday  5/18- will recheck labs in AM/Sunday- ordered labs- will also reduce Na levels to 2G BID with meals from TID  5/19- Na 138, but reduction hasn't shown up yet, since got lunch NA yesterday- will recheck this week 5/20: NA low 134 with reduction, resume 2 g 3 times daily dosing and recheck on Wednesday 5/21: repeat BMP today stat d/t lethergy - NA 137 - stable - refused recheck 5/22 and 5/24 AM; giving option of today or tomorrow AM, but will need to check Na to continue titrating salt tabs  10.  Decreased nutritional storage.  Currently on a dysphagia #3 diet.  NGT d/ced   - 5/14: DC SSI, CBG  11.  Urinary retention.  Urecholine 5 mg 3 times daily.  Check PVR - resolved  5/8: No PVRs overnight; ordered. Mixed continence overnight.   5/9: Continue UO and low PVRs. DC urecholine.   5/10: PVRs remain low, continent.  Continue for 24 hours, then DC PVRs.  5/19- PVRs off- resolved  12.  History of tobacco/alcohol use.  Counseling  13. Diarrhea. Remove rectal tube 5/8. Increase banatrol to  TID.     - 5/9: No recorded Bms since rectal tube removal; reduce banatrol back to BID, obtain KUB  - 5/10: DC banatrol. KUB with mild stool burden.  If no BM today, will need to start laxatives.   5/11: magnesium citrate ordered  5/12: flowsheet reviewd and had type 5 stools today.   5/13: soft stools, last BM 5/12, montior  5/15: Liquid Bms 5/13; now on PO diet, monitor  5/16: add colace 100 mg BID + miralax daily PRN  5/17 schedule miralax  5/18- LBM x2   5/22: change colace to sennakot s 1 tab BID  5-23: Large BM this a.m.; monitor  14. HTN: magnesium citrate ordered, likely elevated due to Florinef, decrease to 1 tab TID    07/08/2022    3:30 AM 07/07/2022    7:44 PM 07/07/2022    1:16 PM  Vitals with BMI  Systolic 130 120 161  Diastolic 92 76 78  Pulse 94 101 108    - 5/13: PRN hydralazine used 1x overnight; BP remains  significantly elevated with decreased Florinef.  Add amlodipine 5 mg daily.  - 5/14: diastolic remains elevated; add lisinopril 5 mg daily. Will take 2-3 days for amlodipine effect. DC florinef - 5/15: Much improved;  DC lisinopril; Dc amlodipine this PM if remains normotensive 5/16: Diastolic remains elevated increase norvasc to 10 mg daily 5/17 controlled overall, continue current  5/18- 5/20- BP controlled- con't regimen  5/24: Mild intermittently tachycardia; encourage PO fluids, BMP pending  15. L eye pain/pressure: Getting MRI brain and orbits w/o contrast d/t worsening symptoms and poor EOMI tolerance; should have been improving with decreased postop edema   - 5/16: MRI converted to CT d/t clips likely to obscure relevant areas. Found "Minimal hazy asymmetric stranding within the right intraorbitalfat, suspected to reflect a degree of passive venous congestion due to the evolving process within the right cerebral hemisphere." No focal s/s cellulitis, but will get CBC today for eval. No management changes, reassured patient and family.   5-20: Ongoing, Fioricet as above.  Labs appear appropriate, no concern for infection.  5/21: Adjusting pain regimen as above.  5/22-24: EOMI tolerance improving. Monitor.   16. Itching, generalized  -5/17 No rash noted, may be dry skin related, start moisturizer, continue benadryl crem    - 5/24: topical benadryll to incision sites daily    LOS: 17 days A FACE TO FACE EVALUATION WAS PERFORMED  Angelina Sheriff 07/08/2022, 8:55 AM

## 2022-07-08 NOTE — Progress Notes (Signed)
Occupational Therapy Session Note  Patient Details  Name: Kristin Simmons MRN: [id number] Date of Birth: 1985/07/09  Today's Date: 07/08/2022 OT Individual Time: 4098-1191 & 1420-1500 OT Individual Time Calculation (min): 40 min & 40 min   Short Term Goals: Week 3:  OT Short Term Goal 1 (Week 3): Patient will complete shower transfer with min A OT Short Term Goal 2 (Week 3): Patient will complete toilet transfer with min A OT Short Term Goal 3 (Week 3): Patient recall hemi dressing techniques with min questioning cues  Skilled Therapeutic Interventions/Progress Updates:  Session 1 Skilled OT intervention completed with focus on ADL retraining, functional transfers. Pt received upright in bed, awake, a little gruff about a lack of sleep but pt agreeable to session. Intermittent generalized pain reported; pre-medicated. OT offered rest breaks, repositioning and distraction throughout for pain reduction.  Labs present for blood draw; pt adamantly refused. Pt's IPR nurse in attendance to assist with compliance for needed blood draw however pt continued to verbalize statements such as "it's my damn arm" and "they owe me $10 for every time they've taken my blood."   Min redirection needed for focus on ADLs. Transitioned to EOB with supervision. Doffed shirt with mod A for overhead method due to tight fit. Donned new shirt with min A however mod cues for sequencing. Donned helmet with assist only for strap. Pt verbalized urgent need to void. Sit > stand using stedy with CGA, then dependent transport to [facility] over toilet. CGA for lowering to commode however poor control and L knee buckle noted; encouraged pt to control her descents for knee/general safety. Continent of void only. Supervision for pericare. Stood CGA in stedy, then mod A to [employer] over hips. Dependent transfer to TIS chair. Pt declined changing LB clothes and wear of AFO as pt with > hour break til next therapy and requested to  return to bed.   Therapy schedule unable to be located. Transported pt dependently to gym for OT to look up schedule. Pt was oriented to month, day of week, and time of day. Back in room, pt declined sitting up in TIS chair. CGA sit > stand using L HHA, then min A stand pivot to EOB with cues needed for weight shifting and positioning. Min A transition to upright in bed for LLE only. Doffed helmet independently. Pt remained upright in bed, with bed alarm on/activated, and with all needs in reach at end of session.  Session 2 Skilled OT intervention completed with focus on LUE NMR, functional transfers. Pt received at handoff from PT in front of pt's room. Pt agreeable to session. Un-rated pain reported in L shoulder; pre-medicated. OT offered rest breaks, repositioning throughout for pain reduction.  PT report of saebo being on L quad. In pt's room, OT switched out saebo from quad to L wrist extensors in prep for NMR activity. The following parameters were followed: Saebo Stim One 330 pulse width 35 Hz pulse rate On 8 sec/ off 8 sec Ramp up/ down 2 sec Symmetrical Biphasic wave form  Max intensity at 500 Ohm load  Transported dependently in w/c > gym. Seated at table pt participated functional grasp/release using water cup and empty cup to transfer water between cups several times. Max cues needed for sequencing, visually attending to task and not compensating with R hand for NMR benefit. Soft tissue massage provided at L trap for pain reduction with pt tolerating well.   Back in room, pt impulsively took off  L shoe with AFO prior to transfer back to bed. Education provided on purpose of keeping these on for safety and efficiency with transfer. CGA sit > stand using L HHA and then min A stand pivot to EOB with cues for weight shifting. Transitioned supine with min A for LLE. Doffed saebo; on for a total of 30 mins, no adverse skin reaction or irritation noted.  Pt remained upright in bed on  heating pad on L shoulder, with bed alarm on/activated, and with all needs in reach at end of session.   Therapy Documentation Precautions:  Precautions Precautions: Fall Precaution Comments: R craniectomy, no bone flap, bone flap placed in abdomen, R hand mitt, coretrak, flexiseal Restrictions Weight Bearing Restrictions: No    Therapy/Group: Individual Therapy  Melvyn Novas, MS, OTR/L  07/08/2022, 3:06 PM

## 2022-07-08 NOTE — Progress Notes (Signed)
Refused to have blood work completed this morning. Restless this morning in bed. Denies any needs at this time during interaction.

## 2022-07-09 NOTE — Progress Notes (Addendum)
Family visiting patient this evening. Female partner of patient and three of patient's children. After family left patient states regarding female partner being good and not being good to her. Wouldn't elaborate further on this statement. Encouraged patient importance of taking care of self. Attempted to have conversation about future goals and plans after discharge. Patient rolled to side. Affect became flat and conversation became guarded. Did have superficial conversation about how she plans to go to [place] next week.

## 2022-07-09 NOTE — Progress Notes (Signed)
Physical Therapy Session Note  Patient Details  Name: Kristin Simmons MRN: [id number] Date of Birth: [birth year]  Today's Date: 07/09/2022 PT Individual Time: 1191-4782 PT Individual Time Calculation (min): 54 min   Short Term Goals: Week 3:  PT Short Term Goal 1 (Week 3): STG=LTGs secondary to ELOS  Skilled Therapeutic Interventions/Progress Updates:    Pt received supine in bed, awake with L LE hanging off EOB and pt agreeable to therapy session. Pt with L inattention, impaired safety awareness, and impulsivity throughout session requiring cuing for safety awareness and stepping L LE sufficiently to maintain balance.  Supine>sitting L EOB, HOB flat, with close supervision for safety. Donned helmet for OOB mobility. Donned shoes and L LE Matrix Max GRAFO total assist for time management. Pt reports need to use bathroom. Donned L UE wrist cock-up splint for wrist support during session.  Sit<>stands using R UE support on hemiwalker [place]a) with CGA for steadying.   Gait training ~15-55ft x2 to/from bathroom using R UE support on HW with min assist for balance/stability - pt continues to lack full L knee extension during stance, but sufficient muscle activation with GRAFO prevents buckling - when she is walking out to the sink, pt directs attention to washing hands so she doesn't step L LE fully up to sink and does not get close enough for balance safety, demonstrating decreased awareness.  Pt reports she is too fatigued to walk to gym so requests transport via wheelchair.  Transported to/from gym in w/c for time management and energy conservation.  Gait training 175ft x2, no UE support but therapist supporting L UE due to pt reporting pain when letting it hang, with heavy min assist for balance and +2 guarding on R side for safety. Pt demonstrating the following gait deviations with therapist providing the described cuing and facilitation for improvement:  - when not using AD, pt forced to  increase attention to achieve sufficient L step length for reciprocal stepping pattern - continues to have increased postural sway with L lateral LOB bias and pt with decreased awareness of when LOB occurs - donned 4lb ankle weight on L LE to force increased attention for lifting L LE and advancing during swing, with some improvement but inconsistent  - continues to have slight L knee flexion during stance but no true buckling - L LE excessively adducting causing slight L ankle moving into inversion/supination but AFO brace protecting her from rolling it   Stair navigation training ascending/descending 12 steps (6" height) using R UE support on each HR and therapist supporting L UE with heavy min assist for balance while wearing 4lb ankle weight on L LE: - started with step-to pattern with pt self-selecting leading with L LE in each direction with adequate L knee control  - progressed to reciprocal stepping pattern with pt requiring heavier min assist for lifting during ascent, due to pt lacking sufficient L hip/knee extension to power up, but not buckling - able to advance L LE onto next step during descent without buckling   Pt reporting fatigue and requesting to rest. Short distance ~33ft ambulatory transfer to EOB without UE support with min assist and pt starts to sit prior to turning and backing up fully, when cued pt able to identify her error. Sit>supine with supervision. Pt left in bed with needs in reach and bed alarm on.   Therapy Documentation Precautions:  Precautions Precautions: Fall Precaution Comments: R craniectomy, no bone flap, bone flap placed in abdomen, R hand mitt,  coretrak, flexiseal Restrictions Weight Bearing Restrictions: No   Pain:  Reports L shoulder pain that worsens when arm hanging in a dependent position - pt reports premedicated - therapist supports extremity during gait training tasks for pain management.    Therapy/Group: Individual Therapy  Ginny Forth , PT, DPT, NCS, CSRS 07/09/2022, 12:39 PM

## 2022-07-09 NOTE — Progress Notes (Signed)
Endorses pain with left leg rates pain "12". Explains pain radiating down her left side of the leg to her ankle. Also, endorsing pain with her great big toe. Nail bed on right big toe left side of the nail bed nail appears to be pushing into the skin. Does endorse pain when touching area.

## 2022-07-09 NOTE — Progress Notes (Signed)
PROGRESS NOTE   Subjective/Complaints: No leg pain complaints, had complaints earlier today with nursing. Patient currently complaining of left shoulder pain.  This is both at rest and with movement.  She denies sleeping on that side she denies falling on that side. K-pad is on edge of bed rather than on shoulder currently.  ROS:   , N/V, abdominal pain, constipation, diarrhea, SOB, cough, chest pain,     Objective:   No results found. No results for input(s): "WBC", "HGB", "HCT", "PLT" in the last 72 hours.  No results for input(s): "NA", "K", "CL", "CO2", "GLUCOSE", "BUN", "CREATININE", "CALCIUM" in the last 72 hours.   Intake/Output Summary (Last 24 hours) at 07/09/2022 1238 Last data filed at 07/08/2022 1900 Gross per 24 hour  Intake 238 ml  Output --  Net 238 ml         Physical Exam: Vital Signs Blood pressure 127/88, pulse 85, temperature 98.4 F (36.9 C), resp. rate 16, height 5\' 4"  (1.626 m), weight 51.2 kg, SpO2 100 %. Constitutional: No acute distress, sitting up in bed eating lunch HENT: No JVD. Neck Supple. Trachea midline. + R craniotomy  eyes: PERRLA. Can tolerate full EOMI in both directions with eyes open, minimal R eye discomfort cardiovascular: RRR.  No murmurs, rubs, gallops. Respiratory: CTA B/L no W/R/R Abdomen: soft, NT, ND, (+) hyperactive BS since eating GU: Not examined.   Skin: R craniectomy and R abdominal surgical sites - healing,  CDI.   + Raccoon eyes - improving  MSK:     Half finger breaths subluxed left shoulder no pain with external rotation mild pain with impingement testing.      Strength: Exam unchanged on 5/25                RUE: 5/5 SA, 5/5 EF, 5/5 EE, 5/5 WE, 5/5 FF, 5/5 FA                 LUE:  2/5 FF, 1/5 FA, 2/5 EF, 2/5 EE                 RLE: 5/5 HF, 5/5 KE, 5/5 DF, 5/5 EHL, 5/5 PF                 LLE:  3/5 KE, +LLE AFO, 2/5 toe flexion/extension  Neurologic exam:    Sensation: Altered sensation to light touch in left upper extremity and left lower extremity -no longer painful or hypersensitive-ongoing CN: + L tongue deviation - minimal    Assessment/Plan: 1. Functional deficits which require 3+ hours per day of interdisciplinary therapy in a comprehensive inpatient rehab setting. Physiatrist is providing close team supervision and 24 hour management of active medical problems listed below. Physiatrist and rehab team continue to assess barriers to discharge/monitor patient progress toward functional and medical goals  Care Tool:  Bathing    Body parts bathed by patient: Left arm, Chest, Abdomen, Right upper leg   Body parts bathed by helper: Right arm, Front perineal area, Buttocks, Left upper leg, Right lower leg, Left lower leg, Face     Bathing assist Assist Level: Moderate Assistance - Patient 50 - 74%  Upper Body Dressing/Undressing Upper body dressing   What is the patient wearing?: Pull over shirt    Upper body assist Assist Level: Moderate Assistance - Patient 50 - 74%    Lower Body Dressing/Undressing Lower body dressing      What is the patient wearing?: Pants     Lower body assist Assist for lower body dressing: Maximal Assistance - Patient 25 - 49%     Toileting Toileting    Toileting assist Assist for toileting: Maximal Assistance - Patient 25 - 49%     Transfers Chair/bed transfer  Transfers assist  Chair/bed transfer activity did not occur: Safety/medical concerns  Chair/bed transfer assist level: Moderate Assistance - Patient 50 - 74%     Locomotion Ambulation   Ambulation assist      Assist level: Maximal Assistance - Patient 25 - 49% Assistive device: Orthosis Max distance: 50   Walk 10 feet activity   Assist     Assist level: Maximal Assistance - Patient 25 - 49% Assistive device: Orthosis, Walker-hemi   Walk 50 feet activity   Assist Walk 50 feet with 2 turns activity did not  occur: Safety/medical concerns (Patient unable to ambulate >30' at this time secondary to fatigue, poor endurance/activity tolerance, impaired attention, increased pain, etc.)  Assist level: Maximal Assistance - Patient 25 - 49% Assistive device: Walker-hemi    Walk 150 feet activity   Assist Walk 150 feet activity did not occur: Safety/medical concerns         Walk 10 feet on uneven surface  activity   Assist Walk 10 feet on uneven surfaces activity did not occur: Safety/medical concerns         Wheelchair     Assist Is the patient using a wheelchair?: Yes Type of Wheelchair: Manual (per therapist, pt using TIS/Recline back wheelchair)    Wheelchair assist level: Dependent - Patient 0% Max wheelchair distance: 150'    Wheelchair 50 feet with 2 turns activity    Assist        Assist Level: Dependent - Patient 0%   Wheelchair 150 feet activity     Assist      Assist Level: Dependent - Patient 0%   Blood pressure 127/88, pulse 85, temperature 98.4 F (36.9 C), resp. rate 16, height 5\' 4"  (1.626 m), weight 51.2 kg, SpO2 100 %.  1. 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 Conchita Paris. Bone flap in abdominal pocket, plan for subacute flap replacement after IPR             -patient may  shower if cover incisions             -ELOS/Goals: 6/5 goal DC date; supervision to min A   Safety precautions due to poor impulse control - Helmet ordered for OOB 5/8; WHO and [facility] 5/9  Con't CIR PT, OT and SLP   - 5/21: AFO order   - 5/14: Starting to advance L leg with some buckling, Min A goals seem appropriate. SLP attention, recall improving but family is triggering and she has behaviors when family comes in during therapies.   5/15: CT head showing improved edema, small SDH, and unchanged 8 mm midline shift  5/21: poor L attention but  getting some functional grasp in LUE; L lateral lean complicating EOB balance; CGA-Min A transfer and Min-Mod transfers mostly c/b poor safety awareness. Walking up to  180 ft with handheld assist, can advance L leg but gets buckling in L knee  despite anterior support AFO.   5/23: Less impulsive over past week, will discuss with therapies and nursing today removing TeleSitter; Dced telesitter.   5/24: was somewhat impulsive overnight, needed reminders to stay in bed. Continue bed alarm and posey belt in WC.   2 Antithrombotics: -DVT/anticoagulation:  Pharmaceutical: Lovenox initiated 07/04/2022             -antiplatelet therapy: N/A  3. Pain Management/neuropathic pain: Oxycodone as needed  5/8: added lidocaine patch x2 to L neck, shoulder  5/9: Add gabapentin 300 mg TID for LUE and LLE pain, likely related to spasticity and neuropathic.   5/10: Mild lethargy today but no use of PRN pain medications; decrease gabapentin to 100 mg TID  5/11: kpad and left shoulder sling ordered  5/23: Using oxycodone Q4H; continue  5/14: Using tylenol and oxycodone together consistently; schedule tylenol 1000 mg TID and oxycodone 5 mg TID; add oxycodone 5 mg BID Prn for breakthrough  -- Increase gabapentin to 200/200/400 mg daily  5/16: Increase gabapentin to 200/200/600 mg for pain and sleep  5/17 try voltaren gel L shoulder  5/18-5/19- denies pain this AM  5/20: Due to no improvement with prior measures, discontinue daytime gabapentin, standing Tylenol, and standing oxycodone.  Changed headache regimen to Fioricet 1 tab 4 times daily as needed, and oxycodone 5 mg 4 times daily as needed for pain.  5/21 d/t daytime lethargy + poor pain control, change gabapentin 600 mg QHS to 200 mg TID. DC fioricet, resume scheduled tylenol 1000 mg TID. Continue PRN oxy  5/22: increase QHS gabapentin to 400 mg given increased sensitivity/pain after cutting back from 600 qhs. Add baclofen 5 mg TID PRN for spasms -improved pain  control, continue this regimen, encouraged use of as needed baclofen for abdominal pain   4. Mood/Behavior/Sleep: PRN twice daily as needed provide emotional support             -antipsychotic agents: N/A  - Sleep log added - poor sleep 3 hours last night. Add melatonin 5 mg QHS + PRN trazodone 50-75 mg.   5/10: Slept 7 hours overnight, still endorsing poor sleep.  Did not use as needed trazodone, will add 50 mg standing nightly.  5/11: continue trazodone  5/13: Per log slept 5 hours overnight, however was not updated 5-10: Will recheck tomorrow  5.14: slept 7 hours overnight; see above for increased QHS gabapentin  5/15: s sleep log not completed overnight, informed nursing.  Patient endorses ongoing poor sleep, no improvement with increase gabapentin.  5/16: interrupted sleep 7+ hours; increase trazodone to 75 mg and increase gabapentin as above  5/18-20 sleeping better  5/21: Worsening lethargy per therapies over last 2 days; possible etiologies include medication induced, poor pain control, and metabolic/hyponatremia. DC fioricet, decrease nighttime gabapentin, stat CBC and BMP ordered. Sleep log remains interrupted but appropriate.   5/22: CBC, BMP WNL. Re-time sleep medications to 2000. Increase gabapentin QHS as above and Trazadone to 100 mg QHS.  - sleeping well, no further nighttime pain  5. Neuropsych/cognition: This patient is not capable of making decisions on her own behalf -not consistently oriented, impulsive with poor awareness..   - Obs for safety  5/19- con't Telesitter and NT for meals  6. Skin/Wound Care: Routine skin checks  - 5/8: 3 weeks post-R craniectomy; ordered staple removal from scalp and abdomen. no apparent wounds will DC rectal tube  7. Dysphagia: placed order for Cortrak removal 5/12 Diet Orders (From admission, onward)     Start     Ordered   07/04/22 1303  DIET DYS 3 Room service appropriate? Yes with Assist; Fluid consistency: Thin; Fluid restriction:  1200 mL Fluid  Diet effective now       Comments: Meds whole with liquid; full supervision with meals  Question Answer Comment  Room service appropriate? Yes with Assist   Fluid consistency: Thin   Fluid restriction: 1200 mL Fluid      07/04/22 1302          5/13: Monitor POs with cortrak removal 5/18-22 eating well    8.  Hyponatremia.  Continue sodium chloride tablets.  Florinef 0.3 mg 3 times daily  - 5/8: Na 134 this AM. Downtrending over last 2 days. No obvious medication contributors; Already on salt tabs 2g TID. Placed 1500 ml fluid restriction, repeat today 138. Ordered daily BMP x3 days for monitoring. Reduce to Florinef 0.1 mg TID and monitor.   5/9: Na stable 13. Diastolic BP improved, remains high systolic. Orthostatic vitals negative with BP. Given florinef indication is hyponatremia not orthostasis will continue 0.1 mg TID through Friday then DC if no significant drop in Na.   5-10: Na down to 135 with mildly increased lethargy.  Increase Florinef to 0.2 mg 3 times daily, continue daily BMP for 3 days.  5/12: Na improved to 136, decrease Florinef to 0.1mg  TID given hypertension  5/13: NA 136 with decreased Florinef; reduced dosing to twice daily and recheck BMP Wednesday  5/14: DC florinef d/t persistent hypertension; labs pending  5/16: Na stable; will check today, then reduce Na starting tomorrow if appropriate with repeat labs Sunday  5/18- will recheck labs in AM/Sunday- ordered labs- will also reduce Na levels to 2G BID with meals from TID  5/19- Na 138, but reduction hasn't shown up yet, since got lunch NA yesterday- will recheck this week 5/20: NA low 134 with reduction, resume 2 g 3 times daily dosing and recheck on Wednesday 5/21: repeat BMP today stat d/t lethergy - NA 137 - stable - refused recheck 5/22 and 5/24 AM; giving option of today or tomorrow AM, but will need to check Na to continue titrating salt tabs  10.  Decreased nutritional storage.  Currently on  a dysphagia #3 diet.  NGT d/ced   - 5/14: DC SSI, CBG  11.  Urinary retention.  Urecholine 5 mg 3 times daily.  Check PVR - resolved  5/8: No PVRs overnight; ordered. Mixed continence overnight.   5/9: Continue UO and low PVRs. DC urecholine.   5/10: PVRs remain low, continent.  Continue for 24 hours, then DC PVRs.  5/19- PVRs off- resolved  12.  History of tobacco/alcohol use.  Counseling  13. Diarrhea. Remove rectal tube 5/8. Increase banatrol to TID.     - 5/9: No recorded Bms since rectal tube removal; reduce banatrol back to BID, obtain KUB  - 5/10: DC banatrol. KUB with mild stool burden.  If no BM today, will need to start laxatives.   5/11: magnesium citrate ordered  5/12: flowsheet reviewd and had type 5 stools today.   5/13: soft stools, last BM 5/12, montior  5/15: Liquid Bms 5/13; now on PO diet, monitor  5/16: add colace 100 mg BID + miralax daily PRN  5/17 schedule miralax  5/18- LBM x2   5/22: change colace to sennakot s 1 tab BID  5-23: Large BM  this a.m.; monitor  14. HTN: magnesium citrate ordered, likely elevated due to Florinef, decrease to 1 tab TID    07/09/2022    5:21 AM 07/08/2022    8:00 PM 07/08/2022    3:44 PM  Vitals with BMI  Systolic 127 128 914  Diastolic 88 91 81  Pulse 85 89 89    - 5/13: PRN hydralazine used 1x overnight; BP remains significantly elevated with decreased Florinef.  Add amlodipine 5 mg daily.  - 5/14: diastolic remains elevated; add lisinopril 5 mg daily. Will take 2-3 days for amlodipine effect. DC florinef - 5/15: Much improved;  DC lisinopril; Dc amlodipine this PM if remains normotensive 5/16: Diastolic remains elevated increase norvasc to 10 mg daily 5/17 controlled overall, continue current  5/18- 5/20- BP controlled- con't regimen  5/24: Mild intermittently tachycardia; encourage PO fluids, BMP pending  15. L eye pain/pressure: Getting MRI brain and orbits w/o contrast d/t worsening symptoms and poor EOMI tolerance;  should have been improving with decreased postop edema   - 5/16: MRI converted to CT d/t clips likely to obscure relevant areas. Found "Minimal hazy asymmetric stranding within the right intraorbitalfat, suspected to reflect a degree of passive venous congestion due to the evolving process within the right cerebral hemisphere." No focal s/s cellulitis, but will get CBC today for eval. No management changes, reassured patient and family.   5-20: Ongoing, Fioricet as above.  Labs appear appropriate, no concern for infection.  5/21: Adjusting pain regimen as above.  5/22-24: EOMI tolerance improving. Monitor.   16. Itching, generalized  -5/17 No rash noted, may be dry skin related, start moisturizer, continue benadryl crem    - 5/24: topical benadryll to incision sites daily   17.  Posterior shoulder pain left side, discussed with patient this is common post stroke especially with the degree of weakness that she is experiencing. Continue PT OT efforts, continue Voltaren gel 4 times daily, patient needs K-pad to be positioned over her shoulder.  LOS: 18 days A FACE TO FACE EVALUATION WAS PERFORMED  Erick Colace 07/09/2022, 12:38 PM

## 2022-07-09 NOTE — Progress Notes (Signed)
Laundry service called. Patient expressed sheets causing her to "itch". Attempting to obtain hypoallergenic sheets for patient.

## 2022-07-10 NOTE — Progress Notes (Signed)
Occupational Therapy Session Note  Patient Details  Name: Kristin Simmons MRN: [id number] Date of Birth: 1985/06/26  Today's Date: 07/10/2022 OT Individual Time: 1105-1200 OT Individual Time Calculation (min): 55 min    Short Term Goals: Week 1:  OT Short Term Goal 1 (Week 1): Pt will sit EOM with MIN-MOD A for 5 min during funcitoanl task in prep for BADL OT Short Term Goal 1 - Progress (Week 1): Met OT Short Term Goal 2 (Week 1): Pt will locate items on L of sink with mod cuing OT Short Term Goal 2 - Progress (Week 1): Met OT Short Term Goal 3 (Week 1): Pt will verbalize 1 step of hemi dressing technique OT Short Term Goal 3 - Progress (Week 1): Met OT Short Term Goal 4 (Week 1): pt will demo improved awareness/decreased impulsivity by waiting for OT to transfer wiht no more than min cuing throughout session OT Short Term Goal 4 - Progress (Week 1): Met Week 2:  OT Short Term Goal 1 (Week 2): Patient will demonstrate improved awarenress of L hemi body by positioning L UE prior to transfer OT Short Term Goal 1 - Progress (Week 2): Met OT Short Term Goal 2 (Week 2): Patient will complete 1 step of LB dressing task OT Short Term Goal 2 - Progress (Week 2): Met OT Short Term Goal 3 (Week 2): Patient will dress L UE first with mod A OT Short Term Goal 3 - Progress (Week 2): Met Week 3:  OT Short Term Goal 1 (Week 3): Patient will complete shower transfer with min A OT Short Term Goal 2 (Week 3): Patient will complete toilet transfer with min A OT Short Term Goal 3 (Week 3): Patient recall hemi dressing techniques with min questioning cues  Skilled Therapeutic Interventions/Progress Updates:    1:1 Pt received in TIS w/c. Self care retraining at shower level . PT assisted with picking out clothing. Pt performed stand step transfer into shower to [facility] in shower (facing the grab bar). Pt was able to bathe all parts with right hand except for right UE in seated position. Pt did required min  cues to wash all parts. Pt transferred stand steps out of shower to the w/c with min A. Pt able to don shirt with good recall of method with min A with min cues to pull down shirt over trunk on left side. Pt able to thread underwear and pants and pull them up sit to stand with min A for balance and to adjust the waist band on the left. Pt requested to get back into bed for lunch.  Pt ambulated back over to EOb and able to transition into supine with supervision with cues for attention to left side.  In sitting and supine focus on NMR of left UE. Pt did not recall being able to move her UE before (as noted in other sessions). With facilitation with gravity assisted pt able to elicite a weak grasp of all fingers, shoulder adduction/ internal rotation, slight shoulder flexion, elbow flexion, able to help relax some flexion tone into extension and produce supination and pronation- holding therapist hand. Pt with difficulty at times sustaining her attention to task to follow through each time - but was able to reproduce motions.   Left resting in bed with bed alarm on and call bell in hand.  Therapy Documentation Precautions:  Precautions Precautions: Fall Precaution Comments: R craniectomy, no bone flap, bone flap placed in abdomen, R hand mitt, coretrak, flexiseal  Restrictions Weight Bearing Restrictions: No  Pain: Pain Assessment Pain Scale: 0-10 Pain Score: 0-No pain    Therapy/Group: Individual Therapy  Kristin Simmons [facility] 07/10/2022, 2:45 PM

## 2022-07-10 NOTE — Progress Notes (Signed)
PROGRESS NOTE   Subjective/Complaints:   ROS:   , N/V, abdominal pain, constipation, diarrhea, SOB, cough, chest pain,     Objective:   No results found. No results for input(s): "WBC", "HGB", "HCT", "PLT" in the last 72 hours.  No results for input(s): "NA", "K", "CL", "CO2", "GLUCOSE", "BUN", "CREATININE", "CALCIUM" in the last 72 hours.   Intake/Output Summary (Last 24 hours) at 07/10/2022 1149 Last data filed at 07/10/2022 0800 Gross per 24 hour  Intake 718 ml  Output --  Net 718 ml         Physical Exam: Vital Signs Blood pressure 121/81, pulse 90, temperature 98.8 F (37.1 C), temperature source Oral, resp. rate 18, height 5\' 4"  (1.626 m), weight 51.2 kg, SpO2 100 %.  General: No acute distress Mood and affect are appropriate Heart: Regular rate and rhythm no rubs murmurs or extra sounds Lungs: Clear to auscultation, breathing unlabored, no rales or wheezes Abdomen: Positive bowel sounds, soft nontender to palpation, nondistended Extremities: No clubbing, cyanosis, or edema Skin: R craniectomy and R abdominal surgical sites - healing,  CDI.   + Raccoon eyes - improving  MSK:     Half finger breaths subluxed left shoulder no pain with external rotation mild pain with impingement testing.      Strength: Exam unchanged on 5/25                RUE: 5/5 SA, 5/5 EF, 5/5 EE, 5/5 WE, 5/5 FF, 5/5 FA                 LUE:  2/5 FF, 1/5 FA, 2/5 EF, 2/5 EE                 RLE: 5/5 HF, 5/5 KE, 5/5 DF, 5/5 EHL, 5/5 PF                 LLE:  3/5 KE, +LLE AFO, 2/5 toe flexion/extension  Neurologic exam:   Sensation: Altered sensation to light touch in left upper extremity and left lower extremity -no longer painful or hypersensitive-ongoing CN: + L tongue deviation - minimal    Assessment/Plan: 1. Functional deficits which require 3+ hours per day of interdisciplinary therapy in a comprehensive inpatient rehab  setting. Physiatrist is providing close team supervision and 24 hour management of active medical problems listed below. Physiatrist and rehab team continue to assess barriers to discharge/monitor patient progress toward functional and medical goals  Care Tool:  Bathing    Body parts bathed by patient: Left arm, Chest, Abdomen, Right upper leg   Body parts bathed by helper: Right arm, Front perineal area, Buttocks, Left upper leg, Right lower leg, Left lower leg, Face     Bathing assist Assist Level: Moderate Assistance - Patient 50 - 74%     Upper Body Dressing/Undressing Upper body dressing   What is the patient wearing?: Pull over shirt    Upper body assist Assist Level: Moderate Assistance - Patient 50 - 74%    Lower Body Dressing/Undressing Lower body dressing      What is the patient wearing?: Pants     Lower body assist Assist for lower body  dressing: Maximal Assistance - Patient 25 - 49%     Toileting Toileting    Toileting assist Assist for toileting: Maximal Assistance - Patient 25 - 49%     Transfers Chair/bed transfer  Transfers assist  Chair/bed transfer activity did not occur: Safety/medical concerns  Chair/bed transfer assist level: Moderate Assistance - Patient 50 - 74%     Locomotion Ambulation   Ambulation assist      Assist level: Maximal Assistance - Patient 25 - 49% Assistive device: Orthosis Max distance: 50   Walk 10 feet activity   Assist     Assist level: Maximal Assistance - Patient 25 - 49% Assistive device: Orthosis, Walker-hemi   Walk 50 feet activity   Assist Walk 50 feet with 2 turns activity did not occur: Safety/medical concerns (Patient unable to ambulate >30' at this time secondary to fatigue, poor endurance/activity tolerance, impaired attention, increased pain, etc.)  Assist level: Maximal Assistance - Patient 25 - 49% Assistive device: Walker-hemi    Walk 150 feet activity   Assist Walk 150 feet  activity did not occur: Safety/medical concerns         Walk 10 feet on uneven surface  activity   Assist Walk 10 feet on uneven surfaces activity did not occur: Safety/medical concerns         Wheelchair     Assist Is the patient using a wheelchair?: Yes Type of Wheelchair: Manual (per therapist, pt using TIS/Recline back wheelchair)    Wheelchair assist level: Dependent - Patient 0% Max wheelchair distance: 150'    Wheelchair 50 feet with 2 turns activity    Assist        Assist Level: Dependent - Patient 0%   Wheelchair 150 feet activity     Assist      Assist Level: Dependent - Patient 0%   Blood pressure 121/81, pulse 90, temperature 98.8 F (37.1 C), temperature source Oral, resp. rate 18, height 5\' 4"  (1.626 m), weight 51.2 kg, SpO2 100 %.  1. 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 Conchita Paris. Bone flap in abdominal pocket, plan for subacute flap replacement after IPR             -patient may  shower if cover incisions             -ELOS/Goals: 6/5 goal DC date; supervision to min A   Safety precautions due to poor impulse control - Helmet ordered for OOB 5/8; WHO and [facility] 5/9  Con't CIR PT, OT and SLP   - 5/21: AFO order   - 5/14: Starting to advance L leg with some buckling, Min A goals seem appropriate. SLP attention, recall improving but family is triggering and she has behaviors when family comes in during therapies.   5/15: CT head showing improved edema, small SDH, and unchanged 8 mm midline shift  5/21: poor L attention but getting some functional grasp in LUE; L lateral lean complicating EOB balance; CGA-Min A transfer and Min-Mod transfers mostly c/b poor safety awareness. Walking up to 180 ft with handheld assist, can advance L leg but gets buckling in L knee  despite anterior support AFO.   5/23: Less  impulsive over past week, will discuss with therapies and nursing today removing TeleSitter; Dced telesitter.   5/24: was somewhat impulsive overnight, needed reminders to stay in bed. Continue bed alarm and posey belt in  WC.   2 Antithrombotics: -DVT/anticoagulation:  Pharmaceutical: Lovenox initiated 07/04/2022             -antiplatelet therapy: N/A  3. Pain Management/neuropathic pain: Oxycodone as needed  5/8: added lidocaine patch x2 to L neck, shoulder  5/9: Add gabapentin 300 mg TID for LUE and LLE pain, likely related to spasticity and neuropathic.   5/10: Mild lethargy today but no use of PRN pain medications; decrease gabapentin to 100 mg TID  5/11: kpad and left shoulder sling ordered  5/23: Using oxycodone Q4H; continue  5/14: Using tylenol and oxycodone together consistently; schedule tylenol 1000 mg TID and oxycodone 5 mg TID; add oxycodone 5 mg BID Prn for breakthrough  -- Increase gabapentin to 200/200/400 mg daily  5/16: Increase gabapentin to 200/200/600 mg for pain and sleep  5/17 try voltaren gel L shoulder  5/18-5/19- denies pain this AM  5/20: Due to no improvement with prior measures, discontinue daytime gabapentin, standing Tylenol, and standing oxycodone.  Changed headache regimen to Fioricet 1 tab 4 times daily as needed, and oxycodone 5 mg 4 times daily as needed for pain.  5/21 d/t daytime lethargy + poor pain control, change gabapentin 600 mg QHS to 200 mg TID. DC fioricet, resume scheduled tylenol 1000 mg TID. Continue PRN oxy  5/22: increase QHS gabapentin to 400 mg given increased sensitivity/pain after cutting back from 600 qhs. Add baclofen 5 mg TID PRN for spasms -improved pain control, continue this regimen, encouraged use of as needed baclofen for abdominal pain   4. Mood/Behavior/Sleep: PRN twice daily as needed provide emotional support             -antipsychotic agents: N/A  - Sleep log added - poor sleep 3 hours last night. Add melatonin 5 mg QHS + PRN  trazodone 50-75 mg.   5/10: Slept 7 hours overnight, still endorsing poor sleep.  Did not use as needed trazodone, will add 50 mg standing nightly.  5/11: continue trazodone  5/13: Per log slept 5 hours overnight, however was not updated 5-10: Will recheck tomorrow  5.14: slept 7 hours overnight; see above for increased QHS gabapentin  5/15: s sleep log not completed overnight, informed nursing.  Patient endorses ongoing poor sleep, no improvement with increase gabapentin.  5/16: interrupted sleep 7+ hours; increase trazodone to 75 mg and increase gabapentin as above  5/18-20 sleeping better  5/21: Worsening lethargy per therapies over last 2 days; possible etiologies include medication induced, poor pain control, and metabolic/hyponatremia. DC fioricet, decrease nighttime gabapentin, stat CBC and BMP ordered. Sleep log remains interrupted but appropriate.   5/22: CBC, BMP WNL. Re-time sleep medications to 2000. Increase gabapentin QHS as above and Trazadone to 100 mg QHS.  - sleeping well, no further nighttime pain  5. Neuropsych/cognition: This patient is not capable of making decisions on her own behalf -not consistently oriented, impulsive with poor awareness..   - Obs for safety  5/19- con't Telesitter and NT for meals  6. Skin/Wound Care: Routine skin checks  - 5/8: 3 weeks post-R craniectomy; ordered staple removal from scalp and abdomen. no apparent wounds will DC rectal tube    7. Dysphagia: placed order for Cortrak removal 5/12 Diet Orders (From admission, onward)     Start     Ordered   07/04/22 1303  DIET DYS 3 Room service appropriate? Yes with Assist; Fluid consistency: Thin; Fluid restriction: 1200 mL Fluid  Diet effective now       Comments: Meds  whole with liquid; full supervision with meals  Question Answer Comment  Room service appropriate? Yes with Assist   Fluid consistency: Thin   Fluid restriction: 1200 mL Fluid      07/04/22 1302          5/13: Monitor POs  with cortrak removal 5/18-22 eating well    8.  Hyponatremia.  Continue sodium chloride tablets.  Florinef 0.3 mg 3 times daily  - 5/8: Na 134 this AM. Downtrending over last 2 days. No obvious medication contributors; Already on salt tabs 2g TID. Placed 1500 ml fluid restriction, repeat today 138. Ordered daily BMP x3 days for monitoring. Reduce to Florinef 0.1 mg TID and monitor.   5/9: Na stable 13. Diastolic BP improved, remains high systolic. Orthostatic vitals negative with BP. Given florinef indication is hyponatremia not orthostasis will continue 0.1 mg TID through Friday then DC if no significant drop in Na.   5-10: Na down to 135 with mildly increased lethargy.  Increase Florinef to 0.2 mg 3 times daily, continue daily BMP for 3 days.  5/12: Na improved to 136, decrease Florinef to 0.1mg  TID given hypertension  5/13: NA 136 with decreased Florinef; reduced dosing to twice daily and recheck BMP Wednesday  5/14: DC florinef d/t persistent hypertension; labs pending  5/16: Na stable; will check today, then reduce Na starting tomorrow if appropriate with repeat labs "Sunday  5/18- will recheck labs in AM/Sunday- ordered labs- will also reduce Na levels to 2G BID with meals from TID  5/19- Na 138, but reduction hasn't shown up yet, since got lunch NA yesterday- will recheck this week 5/20: NA low 134 with reduction, resume 2 g 3 times daily dosing and recheck on Wednesday 5/21: repeat BMP today stat d/t lethergy - NA 137 - stable - refused recheck 5/22 and 5/24 AM; giving option of today or tomorrow AM, but will need to check Na to continue titrating salt tabs    Latest Ref Rng & Units 07/05/2022    3:06 PM 07/04/2022    7:34 AM 07/03/2022    5:38 AM  BMP  Glucose 70 - 99 mg/dL 90  86  91   BUN 6 - 20 mg/dL 12  10  10   Creatinine 0.44 - 1.00 mg/dL 0.75  0.61  0.66   Sodium 135 - 145 mmol/L 137  134  138   Potassium 3.5 - 5.1 mmol/L 4.2  4.0  4.3   Chloride 98 - 111 mmol/L 102  99  102    CO2 22 - 32 mmol/L 23  24  25   Calcium 8.9 - 10.3 mg/dL 10.1  10.1  10.0     10" .  Decreased nutritional storage.  Currently on a dysphagia #3 diet.  NGT d/ced   - 5/14: DC SSI, CBG  11.  Urinary retention.  Urecholine 5 mg 3 times daily.  Check PVR - resolved  5/8: No PVRs overnight; ordered. Mixed continence overnight.   5/9: Continue UO and low PVRs. DC urecholine.   5/10: PVRs remain low, continent.  Continue for 24 hours, then DC PVRs.  5/19- PVRs off- resolved  12.  History of tobacco/alcohol use.  Counseling  13. Diarrhea. Remove rectal tube 5/8. Increase banatrol to TID.     - 5/9: No recorded Bms since rectal tube removal; reduce banatrol back to BID, obtain KUB  - 5/10: DC banatrol. KUB with mild stool burden.  If no BM today, will need to start laxatives.  5/11: magnesium citrate ordered  5/12: flowsheet reviewd and had type 5 stools today.   5/13: soft stools, last BM 5/12, montior  5/15: Liquid Bms 5/13; now on PO diet, monitor  5/16: add colace 100 mg BID + miralax daily PRN  5/17 schedule miralax  5/18- LBM x2   5/22: change colace to sennakot s 1 tab BID  5-23: Large BM this a.m.; monitor 5/26 cont BM  14. HTN: now controlled     07/10/2022    4:48 AM 07/09/2022    7:44 PM 07/09/2022    1:50 PM  Vitals with BMI  Systolic 121 134 409  Diastolic 81 88 83  Pulse 90 94 99    - 5/13: PRN hydralazine used 1x overnight; BP remains significantly elevated with decreased Florinef.  Add amlodipine 5 mg daily.  - 5/14: diastolic remains elevated; add lisinopril 5 mg daily. Will take 2-3 days for amlodipine effect. DC florinef - 5/15: Much improved;  DC lisinopril; Dc amlodipine this PM if remains normotensive 5/16: Diastolic remains elevated increase norvasc to 10 mg daily 5/17 controlled overall, continue current  5/18- 5/20- BP controlled- con't regimen  5/24: Mild intermittently tachycardia; encourage PO fluids,   15. L eye pain/pressure: Getting MRI brain and  orbits w/o contrast d/t worsening symptoms and poor EOMI tolerance; should have been improving with decreased postop edema   - 5/16: MRI converted to CT d/t clips likely to obscure relevant areas. Found "Minimal hazy asymmetric stranding within the right intraorbitalfat, suspected to reflect a degree of passive venous congestion due to the evolving process within the right cerebral hemisphere." No focal s/s cellulitis, but will get CBC today for eval. No management changes, reassured patient and family.   5-20: Ongoing, Fioricet as above.  Labs appear appropriate, no concern for infection.  5/21: Adjusting pain regimen as above.  5/22-24: EOMI tolerance improving. Monitor.   16. Itching, generalized  -5/17 No rash noted, may be dry skin related, start moisturizer, continue benadryl crem    - 5/24: topical benadryl to incision sites daily   17.  Posterior shoulder pain left side, discussed with patient this is common post stroke especially with the degree of weakness that she is experiencing. Continue PT OT efforts, continue Voltaren gel 4 times daily, patient needs K-pad to be positioned over her shoulder.  LOS: 19 days A FACE TO FACE EVALUATION WAS PERFORMED  Erick Colace 07/10/2022, 11:49 AM

## 2022-07-10 NOTE — Progress Notes (Signed)
Speech Language Pathology Daily Session Note  Patient Details  Name: Kristin Simmons MRN: [id number] Date of Birth: 07-21-1985  Today's Date: 07/10/2022 SLP Individual Time: 0930-1030 SLP Individual Time Calculation (min): 60 min  Short Term Goals: Week 3: SLP Short Term Goal 1 (Week 3): Patient will consume current diet with minimal overt s/s of aspiration with supervision supervision level verbal cues for use of swallowing compensatory strategies. SLP Short Term Goal 2 (Week 3): Patient will demonstrate sustained attention to functional tasks for 30 minutes with Min verbal cues for redirection. SLP Short Term Goal 3 (Week 3): Patient will attend to left visual field during functional tasks with Min A multimodal cues. SLP Short Term Goal 4 (Week 3): Patient will demonstrate functional problem solving for mildly complex tasks with Min verbal cues. SLP Short Term Goal 5 (Week 3): Patient will self-monitor and correct errors during functional tasks with Min verbal and visual cues. SLP Short Term Goal 6 (Week 3): Patient will recall new, daily information with Mod multimodal cues.  Skilled Therapeutic Interventions: Skilled treatment session focused on cognitive goals. Upon arrival, patient was awake in bed. Patient requested to use the bathroom and completed task with overall Mod verbal cues needed for safety due to impulsivity. Patient was continent of bladder. Patient then performed basic self-care tasks at the sink with extra time and set-up assist. SLP facilitated session by providing a memory/navigation task in which patient had to locate 4 specific places on the unit and perform a task at each place. Patient recalled 75% of tasks independently with Min verbal cues needed for 100% accuracy. Mod verbal cues were needed for problem solving throughout task and only supervision level verbal cues needed for left visual scanning. Patient left upright in TIS wheelchair with alarm on and all needs  within reach. Continue with current plan of care.      Pain Pain in left shoulder Patient premedicated but repositioned   Therapy/Group: Individual Therapy  ,  07/10/2022, 10:43 AM

## 2022-07-11 LAB — CBC WITH DIFFERENTIAL/PLATELET
Abs Immature Granulocytes: 0.02 10*3/uL (ref 0.00–0.07)
Basophils Absolute: 0 10*3/uL (ref 0.0–0.1)
Basophils Relative: 1 %
Eosinophils Absolute: 0.1 10*3/uL (ref 0.0–0.5)
Eosinophils Relative: 1 %
HCT: 39.2 % (ref 36.0–46.0)
Hemoglobin: 12.8 g/dL (ref 12.0–15.0)
Immature Granulocytes: 0 %
Lymphocytes Relative: 32 %
Lymphs Abs: 2.4 10*3/uL (ref 0.7–4.0)
MCH: 30.8 pg (ref 26.0–34.0)
MCHC: 32.7 g/dL (ref 30.0–36.0)
MCV: 94.2 fL (ref 80.0–100.0)
Monocytes Absolute: 1 10*3/uL (ref 0.1–1.0)
Monocytes Relative: 13 %
Neutro Abs: 3.8 10*3/uL (ref 1.7–7.7)
Neutrophils Relative %: 53 %
Platelets: 253 10*3/uL (ref 150–400)
RBC: 4.16 MIL/uL (ref 3.87–5.11)
RDW: 13.9 % (ref 11.5–15.5)
WBC: 7.3 10*3/uL (ref 4.0–10.5)
nRBC: 0 % (ref 0.0–0.2)

## 2022-07-11 LAB — BASIC METABOLIC PANEL
Anion gap: 10 (ref 5–15)
BUN: 7 mg/dL (ref 6–20)
CO2: 23 mmol/L (ref 22–32)
Calcium: 9.8 mg/dL (ref 8.9–10.3)
Chloride: 104 mmol/L (ref 98–111)
Creatinine, Ser: 0.66 mg/dL (ref 0.44–1.00)
GFR, Estimated: >60 mL/min (ref >60)
Glucose, Bld: 100 mg/dL — ABNORMAL HIGH (ref 70–99)
Potassium: 4 mmol/L (ref 3.5–5.1)
Sodium: 137 mmol/L (ref 135–145)

## 2022-07-11 MED ORDER — BACLOFEN 5 MG HALF TABLET
5.0000 mg | ORAL_TABLET | Freq: Every day | ORAL | Status: DC
  Administered 2022-07-11 – 2022-07-13 (×3): 5 mg via ORAL
  Filled 2022-07-11 (×3): qty 1

## 2022-07-11 MED ORDER — GABAPENTIN 300 MG PO CAPS
600.0000 mg | ORAL_CAPSULE | Freq: Every day | ORAL | Status: DC
  Administered 2022-07-11 – 2022-07-19 (×9): 600 mg via ORAL
  Filled 2022-07-11 (×9): qty 2

## 2022-07-11 MED ORDER — LIDOCAINE 5 % EX PTCH
3.0000 | MEDICATED_PATCH | CUTANEOUS | Status: DC
  Administered 2022-07-12: 1 via TRANSDERMAL
  Administered 2022-07-14 – 2022-07-19 (×6): 3 via TRANSDERMAL
  Filled 2022-07-11 (×7): qty 3

## 2022-07-11 MED ORDER — OXYCODONE HCL 5 MG PO TABS
5.0000 mg | ORAL_TABLET | Freq: Two times a day (BID) | ORAL | Status: DC | PRN
  Administered 2022-07-11 – 2022-07-14 (×5): 5 mg via ORAL
  Filled 2022-07-11 (×5): qty 1

## 2022-07-11 NOTE — Progress Notes (Signed)
Speech Language Pathology Daily Session Note  Patient Details  Name: Kristin Simmons MRN: [id number] Date of Birth: 1985/03/03  Today's Date: 07/11/2022 SLP Individual Time: 0915-1000 SLP Individual Time Calculation (min): 45 min  Short Term Goals: Week 3: SLP Short Term Goal 1 (Week 3): Patient will consume current diet with minimal overt s/s of aspiration with supervision supervision level verbal cues for use of swallowing compensatory strategies. SLP Short Term Goal 2 (Week 3): Patient will demonstrate sustained attention to functional tasks for 30 minutes with Min verbal cues for redirection. SLP Short Term Goal 3 (Week 3): Patient will attend to left visual field during functional tasks with Min A multimodal cues. SLP Short Term Goal 4 (Week 3): Patient will demonstrate functional problem solving for mildly complex tasks with Min verbal cues. SLP Short Term Goal 5 (Week 3): Patient will self-monitor and correct errors during functional tasks with Min verbal and visual cues. SLP Short Term Goal 6 (Week 3): Patient will recall new, daily information with Mod multimodal cues.  Skilled Therapeutic Interventions: Skilled treatment session focused on cognitive goals. SLP facilitated session by providing Mod A verbal and visual cues for selective attention and visual scanning while utilizing a menu to choose lunch items. Patient also participated in a high level cognitive task in which patient had to utilize a grocery ad to locate specific items and their prices given specific constraints. Patient completed task with overall Min verbal cues for problem solving, attention and visual scanning to left. Patient left upright in TIS wheelchair with alarm on and all needs within reach. Continue with current plan of care.      Pain No/Denies Pain during session   Therapy/Group: Individual Therapy  ,  07/11/2022, 10:39 AM

## 2022-07-11 NOTE — Progress Notes (Signed)
Patient ID: Kristin Simmons, female   DOB: February 13, 1986, 37 y.o.   MRN: [id number]  SW faxed CAP/DA provider form with limited clinicals to [id number] [phone]  SW left CAP/DA packet in room.   Cecile Sheerer, MSW, LCSWA Office: [phone] Cell: [phone] Fax: [phone]

## 2022-07-11 NOTE — Progress Notes (Signed)
PROGRESS NOTE   Subjective/Complaints:  No events overnight.   Patient complains of ongoing pain in her left upper extremity, as well as a tearing sensation in her right quadrant.  Does not remember that she has a bone flap there, but does remember that it needs to remain in for approximately 6 months before it will be replaced.  Repeatedly presses flap and asked why there is hardness, swelling there.  Otherwise, doing well.  Sleeping relatively well, still some tiredness in the a.m. but primarily attributed to being off of her normal sleep cycle.  Joking with SLP this a.m.  ROS:  R eye pain -no further complaints L sided sensory loss/pain - improving, primarily at nighttime Itching - ongoing Denies fevers, chills, N/V, abdominal pain, constipation, diarrhea, SOB, cough, chest pain, new weakness  Objective:   No results found. No results for input(s): "WBC", "HGB", "HCT", "PLT" in the last 72 hours.  No results for input(s): "NA", "K", "CL", "CO2", "GLUCOSE", "BUN", "CREATININE", "CALCIUM" in the last 72 hours.   Intake/Output Summary (Last 24 hours) at 07/11/2022 0828 Last data filed at 07/10/2022 1242 Gross per 24 hour  Intake 240 ml  Output --  Net 240 ml         Physical Exam: Vital Signs Blood pressure 101/73, pulse 75, temperature 98 F (36.7 C), resp. rate 18, height 5\' 4"  (1.626 m), weight 51.2 kg, SpO2 98 %.  General: No acute distress.  Sitting up in wheelchair, working with SLP Heart: Regular rate and rhythm no rubs murmurs or extra sounds Lungs: Clear to auscultation, breathing unlabored, no rales or wheezes Abdomen: Positive bowel sounds, soft nontender to palpation, nondistended Extremities: No clubbing, cyanosis, or edema Skin:  R craniectomy - cdi- swelling essentially resolved  R abdominal surgical sites - healing,  CDI.   + Raccoon eyes - improving   MSK:     No apparent subluxed left shoulder  no pain on palpation.      Strength: Exam unchanged on 5/25                RUE: 5/5 SA, 5/5 EF, 5/5 EE, 5/5 WE, 5/5 FF, 5/5 FA                 LUE:  2/5 FF, 1/5 FA, 2/5 EF, 2/5 EE -unchanged                RLE: 5/5 HF, 5/5 KE, 5/5 DF, 5/5 EHL, 5/5 PF                 LLE:  3/5 KE, +LLE AFO, 2/5 toe flexion/extension  Neurologic exam:  Sensation: Altered sensation to light touch in left upper extremity and left lower extremity -no longer painful or hypersensitive-ongoing CN: + L tongue deviation -no longer appreciated    Assessment/Plan: 1. Functional deficits which require 3+ hours per day of interdisciplinary therapy in a comprehensive inpatient rehab setting. Physiatrist is providing close team supervision and 24 hour management of active medical problems listed below. Physiatrist and rehab team continue to assess barriers to discharge/monitor patient progress toward functional and medical goals  Care Tool:  Bathing    Body  parts bathed by patient: Left arm, Chest, Abdomen, Right upper leg   Body parts bathed by helper: Right arm, Front perineal area, Buttocks, Left upper leg, Right lower leg, Left lower leg, Face     Bathing assist Assist Level: Moderate Assistance - Patient 50 - 74%     Upper Body Dressing/Undressing Upper body dressing   What is the patient wearing?: Pull over shirt    Upper body assist Assist Level: Moderate Assistance - Patient 50 - 74%    Lower Body Dressing/Undressing Lower body dressing      What is the patient wearing?: Pants     Lower body assist Assist for lower body dressing: Maximal Assistance - Patient 25 - 49%     Toileting Toileting    Toileting assist Assist for toileting: Maximal Assistance - Patient 25 - 49%     Transfers Chair/bed transfer  Transfers assist  Chair/bed transfer activity did not occur: Safety/medical concerns  Chair/bed transfer assist level: Moderate Assistance - Patient 50 - 74%      Locomotion Ambulation   Ambulation assist      Assist level: Maximal Assistance - Patient 25 - 49% Assistive device: Orthosis Max distance: 50   Walk 10 feet activity   Assist     Assist level: Maximal Assistance - Patient 25 - 49% Assistive device: Orthosis, Walker-hemi   Walk 50 feet activity   Assist Walk 50 feet with 2 turns activity did not occur: Safety/medical concerns (Patient unable to ambulate >30' at this time secondary to fatigue, poor endurance/activity tolerance, impaired attention, increased pain, etc.)  Assist level: Maximal Assistance - Patient 25 - 49% Assistive device: Walker-hemi    Walk 150 feet activity   Assist Walk 150 feet activity did not occur: Safety/medical concerns         Walk 10 feet on uneven surface  activity   Assist Walk 10 feet on uneven surfaces activity did not occur: Safety/medical concerns         Wheelchair     Assist Is the patient using a wheelchair?: Yes Type of Wheelchair: Manual (per therapist, pt using TIS/Recline back wheelchair)    Wheelchair assist level: Dependent - Patient 0% Max wheelchair distance: 150'    Wheelchair 50 feet with 2 turns activity    Assist        Assist Level: Dependent - Patient 0%   Wheelchair 150 feet activity     Assist      Assist Level: Dependent - Patient 0%   Blood pressure 101/73, pulse 75, temperature 98 F (36.7 C), resp. rate 18, height 5\' 4"  (1.626 m), weight 51.2 kg, SpO2 98 %.  1. 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 Conchita Paris. Bone flap in abdominal pocket, plan for subacute flap replacement after IPR             -patient may  shower if cover incisions             -ELOS/Goals: 6/5 goal DC date; supervision to min A   Safety precautions due to poor impulse control - Helmet ordered for OOB 5/8; WHO and [facility]  5/9  Con't CIR PT, OT and SLP   - 5/21: AFO order   - 5/14: Starting to advance L leg with some buckling, Min A goals seem appropriate. SLP attention, recall improving but family is triggering and she has behaviors when  family comes in during therapies.   5/15: CT head showing improved edema, small SDH, and unchanged 8 mm midline shift  5/21: poor L attention but getting some functional grasp in LUE; L lateral lean complicating EOB balance; CGA-Min A transfer and Min-Mod transfers mostly c/b poor safety awareness. Walking up to 180 ft with handheld assist, can advance L leg but gets buckling in L knee  despite anterior support AFO.   5/23: Less impulsive over past week, will discuss with therapies and nursing today removing TeleSitter; Dced telesitter.   5/24: was somewhat impulsive overnight, needed reminders to stay in bed. Continue bed alarm and posey belt in WC.  - no further attempt OOB per weekend notes  2 Antithrombotics: -DVT/anticoagulation:  Pharmaceutical: Lovenox initiated 07/04/2022             -antiplatelet therapy: N/A  3. Pain Management/neuropathic pain: Oxycodone as needed  5/8: added lidocaine patch x2 to L neck, shoulder  5/9: Add gabapentin 300 mg TID for LUE and LLE pain, likely related to spasticity and neuropathic.   5/10: Mild lethargy today but no use of PRN pain medications; decrease gabapentin to 100 mg TID  5/11: kpad and left shoulder sling ordered  5/23: Using oxycodone Q4H; continue  5/14: Using tylenol and oxycodone together consistently; schedule tylenol 1000 mg TID and oxycodone 5 mg TID; add oxycodone 5 mg BID Prn for breakthrough  -- Increase gabapentin to 200/200/400 mg daily  5/16: Increase gabapentin to 200/200/600 mg for pain and sleep  5/17 try voltaren gel L shoulder  5/18-5/19- denies pain this AM  5/20: Due to no improvement with prior measures, discontinue daytime gabapentin, standing Tylenol, and standing oxycodone.  Changed headache regimen to  Fioricet 1 tab 4 times daily as needed, and oxycodone 5 mg 4 times daily as needed for pain.  5/21 d/t daytime lethargy + poor pain control, change gabapentin 600 mg QHS to 200 mg TID. DC fioricet, resume scheduled tylenol 1000 mg TID. Continue PRN oxy  5/22: increase QHS gabapentin to 400 mg given increased sensitivity/pain after cutting back from 600 qhs. Add baclofen 5 mg TID PRN for spasms -improved pain control, continue this regimen, encouraged use of as needed baclofen for abdominal pain  5/27: Using Oxy mostly QHS; increase gabapentin to 600 mg QHS, decrease oxy to 5 mg BID PRN; schedule baclofen 5 mg QHS. Add Lidocaine patch over RLQ.    4. Mood/Behavior/Sleep: PRN twice daily as needed provide emotional support             -antipsychotic agents: N/A  - Sleep log added - poor sleep 3 hours last night. Add melatonin 5 mg QHS + PRN trazodone 50-75 mg.   5/10: Slept 7 hours overnight, still endorsing poor sleep.  Did not use as needed trazodone, will add 50 mg standing nightly.  5/11: continue trazodone  5/13: Per log slept 5 hours overnight, however was not updated 5-10: Will recheck tomorrow  5.14: slept 7 hours overnight; see above for increased QHS gabapentin  5/15: s sleep log not completed overnight, informed nursing.  Patient endorses ongoing poor sleep, no improvement with increase gabapentin.  5/16: interrupted sleep 7+ hours; increase trazodone to 75 mg and increase gabapentin as above  5/18-20 sleeping better  5/21: Worsening lethargy per therapies over last 2 days; possible etiologies include medication induced, poor pain control, and metabolic/hyponatremia. DC fioricet, decrease nighttime gabapentin, stat CBC and BMP ordered. Sleep log remains interrupted but appropriate.   5/22: CBC,  BMP WNL. Re-time sleep medications to 2000. Increase gabapentin QHS as above and Trazadone to 100 mg QHS.   5. Neuropsych/cognition: This patient is not capable of making decisions on her own behalf  -not consistently oriented, impulsive with poor awareness..   - Obs for safety  5/19- con't Telesitter and NT for meals; Tele Dced 5/24  6. Skin/Wound Care: Routine skin checks  - 5/8: 3 weeks post-R craniectomy; ordered staple removal from scalp and abdomen. no apparent wounds will DC rectal tube    7. Dysphagia: placed order for Cortrak removal 5/12 Diet Orders (From admission, onward)     Start     Ordered   07/04/22 1303  DIET DYS 3 Room service appropriate? Yes with Assist; Fluid consistency: Thin; Fluid restriction: 1200 mL Fluid  Diet effective now       Comments: Meds whole with liquid; full supervision with meals  Question Answer Comment  Room service appropriate? Yes with Assist   Fluid consistency: Thin   Fluid restriction: 1200 mL Fluid      07/04/22 1302          5/13: Monitor POs with cortrak removal 5/18-22 eating well    8.  Hyponatremia.  Continue sodium chloride tablets.  Florinef 0.3 mg 3 times daily  - 5/8: Na 134 this AM. Downtrending over last 2 days. No obvious medication contributors; Already on salt tabs 2g TID. Placed 1500 ml fluid restriction, repeat today 138. Ordered daily BMP x3 days for monitoring. Reduce to Florinef 0.1 mg TID and monitor.   5/9: Na stable 13. Diastolic BP improved, remains high systolic. Orthostatic vitals negative with BP. Given florinef indication is hyponatremia not orthostasis will continue 0.1 mg TID through Friday then DC if no significant drop in Na.   5-10: Na down to 135 with mildly increased lethargy.  Increase Florinef to 0.2 mg 3 times daily, continue daily BMP for 3 days.  5/12: Na improved to 136, decrease Florinef to 0.1mg  TID given hypertension  5/13: NA 136 with decreased Florinef; reduced dosing to twice daily and recheck BMP Wednesday  5/14: DC florinef d/t persistent hypertension; labs pending  5/16: Na stable; will check today, then reduce Na starting tomorrow if appropriate with repeat labs Sunday  5/18- will  recheck labs in AM/Sunday- ordered labs- will also reduce Na levels to 2G BID with meals from TID  5/19- Na 138, but reduction hasn't shown up yet, since got lunch NA yesterday- will recheck this week 5/20: NA low 134 with reduction, resume 2 g 3 times daily dosing and recheck on Wednesday 5/21: repeat BMP today stat d/t lethergy - NA 137 - stable - refused recheck 5/22 and 5/24 AM; giving option of today or tomorrow AM, but will need to check Na to continue titrating salt tabs 5/27: agreeable to labs today, re-ordered     Latest Ref Rng & Units 07/05/2022    3:06 PM 07/04/2022    7:34 AM 07/03/2022    5:38 AM  BMP  Glucose 70 - 99 mg/dL 90  86  91   BUN 6 - 20 mg/dL 12  10  10    Creatinine 0.44 - 1.00 mg/dL 8.29  5.62  1.30   Sodium 135 - 145 mmol/L 137  134  138   Potassium 3.5 - 5.1 mmol/L 4.2  4.0  4.3   Chloride 98 - 111 mmol/L 102  99  102   CO2 22 - 32 mmol/L 23  24  25  Calcium 8.9 - 10.3 mg/dL 16.1  09.6  04.5     10.  Decreased nutritional storage.  Currently on a dysphagia #3 diet.  NGT d/ced   - 5/14: DC SSI, CBG  11.  Urinary retention.  Urecholine 5 mg 3 times daily.  Check PVR - resolved  5/8: No PVRs overnight; ordered. Mixed continence overnight.   5/9: Continue UO and low PVRs. DC urecholine.   5/10: PVRs remain low, continent.  Continue for 24 hours, then DC PVRs.  5/19- PVRs off- resolved  12.  History of tobacco/alcohol use.  Counseling  13. Diarrhea. Remove rectal tube 5/8. Increase banatrol to TID.     - 5/9: No recorded Bms since rectal tube removal; reduce banatrol back to BID, obtain KUB  - 5/10: DC banatrol. KUB with mild stool burden.  If no BM today, will need to start laxatives.   5/11: magnesium citrate ordered  5/12: flowsheet reviewd and had type 5 stools today.   5/13: soft stools, last BM 5/12, montior  5/15: Liquid Bms 5/13; now on PO diet, monitor  5/16: add colace 100 mg BID + miralax daily PRN  5/17 schedule miralax  5/18- LBM x2    5/22: change colace to sennakot s 1 tab BID  5-23: Large BM this a.m.; monitor 5/26 cont BM   14. HTN: now controlled     07/11/2022    5:22 AM 07/10/2022    8:18 PM 07/10/2022   12:42 PM  Vitals with BMI  Systolic 101 130 409  Diastolic 73 93 88  Pulse 75 87 88    - 5/13: PRN hydralazine used 1x overnight; BP remains significantly elevated with decreased Florinef.  Add amlodipine 5 mg daily.  - 5/14: diastolic remains elevated; add lisinopril 5 mg daily. Will take 2-3 days for amlodipine effect. DC florinef - 5/15: Much improved;  DC lisinopril; Dc amlodipine this PM if remains normotensive 5/16: Diastolic remains elevated increase norvasc to 10 mg daily 5/17 controlled overall, continue current  5/18- 5/20- BP controlled- con't regimen  5/24: Mild intermittently tachycardia; encourage PO fluids  5/27: improved HR; monitor  15. L eye pain/pressure: Getting MRI brain and orbits w/o contrast d/t worsening symptoms and poor EOMI tolerance; should have been improving with decreased postop edema   - 5/16: MRI converted to CT d/t clips likely to obscure relevant areas. Found "Minimal hazy asymmetric stranding within the right intraorbitalfat, suspected to reflect a degree of passive venous congestion due to the evolving process within the right cerebral hemisphere." No focal s/s cellulitis, but will get CBC today for eval. No management changes, reassured patient and family.   5-20: Ongoing, Fioricet as above.  Labs appear appropriate, no concern for infection.  5/21: Adjusting pain regimen as above.  5/22-24: EOMI tolerance improving. Monitor.  5/27: EOMI WNL, no further complaints  16. Itching, generalized  -5/17 No rash noted, may be dry skin related, start moisturizer, continue benadryl crem    - 5/24: topical benadryl to incision sites daily   17.  Posterior shoulder pain left side, discussed with patient this is common post stroke especially with the degree of weakness that she is  experiencing. Continue PT OT efforts, continue Voltaren gel 4 times daily, patient needs K-pad to be positioned over her shoulder.  LOS: 20 days A FACE TO FACE EVALUATION WAS PERFORMED  Angelina Sheriff 07/11/2022, 8:28 AM

## 2022-07-11 NOTE — Progress Notes (Signed)
Physical Therapy Session Note  Patient Details  Name: Kristin Simmons MRN: [id number] Date of Birth: 1985/10/10  Today's Date: 07/11/2022 PT Individual Time: 1005-1050 PT Individual Time Calculation (min): 45 min   Short Term Goals: Week 3:  PT Short Term Goal 1 (Week 3): STG=LTGs secondary to ELOS  Skilled Therapeutic Interventions/Progress Updates:  Patient greeted sitting upright in TIS wheelchair in room and agreeable to PT treatment session. Patient wheeled to the day room for time management.   Patient performed various sit/stands with various devices and CGA for safety- Minor VC for proper hand placement prior to standing/sitting with inconsistent carryover noted.   Patient performed various transfers throughout treatment session with various devices and CGA/MinA for improved safety/stability. VC throughout for improved sequencing and not dragging/forgetting about L LE throughout transfers.   Patient gait trained x75' with HW and CGA for safety- Patient with increased ease when using HW with appropriate weight shifting and ambulating and a quicker cadence to where it was appropriate to upgrade the device.   Patient gait trained x75' with [facility] and CGA for safety- Patient with appropriate sequencing and minor VC for improved R lateral weight shift, smaller step with L LE and larger step with R LE throughout with good improvements noted.   Patient gait trained x75' with [facility] and CGA for safety- Same cues provided as above, however patient reporting she did not like using the [facility] due to less support. Therapist provided education regarding practicing with it this week in preparation for discharge with patient agreeable.   Patient returned to her room and performed sit/stand and stand pivot transfer to sitting EOB with SBQC and CGA- Patient transitioned to supine with supv for safety. Patient left supine in bed with call bell within reach, bed alarm on and all needs met. Saebo left on  patient's L quad unattended while patient was supine in bed in order to improved NMR and overall quad strength.  Saebo Stim One 330 pulse width 35 Hz pulse rate On 8 sec/ off 8 sec Ramp up/ down 2 sec Symmetrical Biphasic wave form  Max intensity at 500 Ohm load   Therapy Documentation Precautions:  Precautions Precautions: Fall Precaution Comments: R craniectomy, no bone flap, bone flap placed in abdomen, R hand mitt, coretrak, flexiseal Restrictions Weight Bearing Restrictions: No   Pain: No/Denies pain.     Therapy/Group: Individual Therapy     07/11/2022, 9:52 AM

## 2022-07-11 NOTE — Progress Notes (Signed)
Patient refused labs this morning. Kristin Simmons A

## 2022-07-11 NOTE — Progress Notes (Signed)
Occupational Therapy Session Note  Patient Details  Name: Kristin Simmons MRN: [id number] Date of Birth: May 31, 1985  Today's Date: 07/11/2022 Session 1 OT Individual Time: 4098-1191 OT Individual Time Calculation (min): 58 min   Session 2 OT Individual Time: 1345-1445 OT Individual Time Calculation (min): 60 min    Short Term Goals: Week 3:  OT Short Term Goal 1 (Week 3): Patient will complete shower transfer with min A OT Short Term Goal 2 (Week 3): Patient will complete toilet transfer with min A OT Short Term Goal 3 (Week 3): Patient recall hemi dressing techniques with min questioning cues  Skilled Therapeutic Interventions/Progress Updates:    Session 1 Pt greeted semi-reclined in bed on the phone, awake and agreeable to OT treatment session. Pt reported she did not want to shower today, but agreeable to change clothes. Min A for bed mobility and min A stand-pivot to wc. Addressed hemi-dressing strategies and use of L UE as a stabilizer during BADL and grooming tasks. Pt attempting to integrate L UE more within functional tasks. She does have some finger flexion, but still really only able to use L UE as a stabilizer. Sit<>stands with min A with weight bearing through L UE at the sink for neuro re-ed. Pt set-up for breakfast with min cues to slow down and clear food bolus from mouth before taking another bite. Pt then reported need to go to the bathroom. Stand-pivot with min A to toilet, but needs cues for impulsivity to wait for wc to be locked. Min A for balance while managing clothing. Pt performed peri-care with set-up A. Hand washing completed at the sink with set-up A. Pt left seated in TIS wc with alarm belt on, call bell in reach, and needs met. OT placed SAEBO e-stim on wrist extensors. SAEBO left on for 60 minutes. OT returned to remove SAEBO with skin intact and no adverse reactions.  Saebo Stim One 330 pulse width 35 Hz pulse rate On 8 sec/ off 8 sec Ramp up/ down 2  sec Symmetrical Biphasic wave form  Max intensity at 500 Ohm load Pain:  Denies pain  Session 2 Pt greeted semi-reclined in bed and agreeable to OT treatment session. Min A for bed mobility and worked on donning shoes and AFO. Stand-pivot with min A. Pt wanted to go outside. Pt brought outside via wc but stated she needed to urgently have a BM. Pt brought to public restroom in [facility]. Educated on using the handicapped stall and positioning of wc. Stand-pivot to toilet with min A and min A to manage clothing. Pt with loose BM and voided bladder. Set-up A for hygiene. Pt pivoted back to wc with min A then brought to sink to wash B hands with min A to position L UE. Started to head outside, but stated she had to have another BM. Returned to room and perfoemd stand-pivots with min A to commode, min A clothing management and more loose BM. Set-up A peri-care and min A to manage clothing. Pt brought to therapy gym and worked on L UE NMR with grasp/release of foam, and reaching to place foam on table. Pt needed min guided A with good activation from scap and shoulder to push forward. Pt with some thumb opposition today! Pt returned to room and left seated in wc with alarm belt on, call bell in reach, and needs met.   Therapy Documentation Precautions:  Precautions Precautions: Fall Precaution Comments: R craniectomy, no bone flap, bone flap placed in  abdomen, R hand mitt, coretrak, flexiseal Restrictions Weight Bearing Restrictions: No Pain:  Denies pain  Therapy/Group: Individual Therapy  Mal Amabile 07/11/2022, 2:49 PM

## 2022-07-12 MED ORDER — SODIUM CHLORIDE 1 G PO TABS
2.0000 g | ORAL_TABLET | Freq: Two times a day (BID) | ORAL | Status: DC
  Administered 2022-07-13 – 2022-07-15 (×5): 2 g via ORAL
  Filled 2022-07-12 (×7): qty 2

## 2022-07-12 NOTE — Progress Notes (Signed)
PROGRESS NOTE   Subjective/Complaints:  No events overnight.  States she slept well overnight, improved pain control in her left upper and lower extremity, no further spasms waking her up.  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  Excited regarding family visitation today.  ROS:  R eye pain -no further complaints L sided sensory loss/pain - improving  Itching - improving Denies fevers, chills, N/V, abdominal pain, constipation, diarrhea, SOB, cough, chest pain, new weakness  Objective:   No results found. Recent Labs    07/11/22 1121  WBC 7.3  HGB 12.8  HCT 39.2  PLT 253    Recent Labs    07/11/22 1121  NA 137  K 4.0  CL 104  CO2 23  GLUCOSE 100*  BUN 7  CREATININE 0.66  CALCIUM 9.8     Intake/Output Summary (Last 24 hours) at 07/12/2022 0909 Last data filed at 07/12/2022 0745 Gross per 24 hour  Intake 598 ml  Output --  Net 598 ml         Physical Exam: Vital Signs Blood pressure 124/84, pulse 76, temperature 98.4 F (36.9 C), temperature source Oral, resp. rate 18, height 5\' 4"  (1.626 m), weight 51.2 kg, SpO2 100 %.  General: No acute distress.  Working on stairstepping with PT in gym. Heart: Regular rate and rhythm no rubs murmurs or extra sounds Lungs: Clear to auscultation, breathing unlabored, no rales or wheezes Abdomen: Positive bowel sounds, soft nontender to palpation, nondistended Extremities: No clubbing, cyanosis, or edema Skin:  R craniectomy - cdi -no swelling.  Helmet in place. R abdominal surgical sites - healing,  CDI.   + Raccoon eyes - improving   MSK:     No apparent subluxed left shoulder no pain on palpation-unchanged.  In sling.      Strength: Exam unchanged on 5/25                RUE: 5/5 SA, 5/5 EF, 5/5 EE, 5/5 WE, 5/5 FF, 5/5 FA                 LUE:  2/5 FF, 1/5  FA, 2/5 EF, 2/5 EE -unchanged                RLE: 5/5 HF, 5/5 KE, 5/5 DF, 5/5 EHL, 5/5 PF                 LLE:  3/5 KE, +LLE AFO, 2/5 toe flexion/extension  Neurologic exam:  Sensation: Altered sensation to light touch in left upper extremity and left lower extremity -no longer painful or hypersensitive-ongoing CN: 2 through 12 intact    Assessment/Plan: 1. Functional deficits which require 3+ hours per day of interdisciplinary therapy in a comprehensive inpatient rehab setting. Physiatrist is providing close team supervision and 24 hour management of active medical problems listed below. Physiatrist and rehab team continue to assess barriers to discharge/monitor patient progress toward functional and medical goals  Care Tool:  Bathing    Body parts bathed by patient: Left arm, Chest, Abdomen, Right upper leg   Body parts bathed  by helper: Right arm, Front perineal area, Buttocks, Left upper leg, Right lower leg, Left lower leg, Face     Bathing assist Assist Level: Moderate Assistance - Patient 50 - 74%     Upper Body Dressing/Undressing Upper body dressing   What is the patient wearing?: Pull over shirt    Upper body assist Assist Level: Moderate Assistance - Patient 50 - 74%    Lower Body Dressing/Undressing Lower body dressing      What is the patient wearing?: Pants     Lower body assist Assist for lower body dressing: Maximal Assistance - Patient 25 - 49%     Toileting Toileting    Toileting assist Assist for toileting: Maximal Assistance - Patient 25 - 49%     Transfers Chair/bed transfer  Transfers assist  Chair/bed transfer activity did not occur: Safety/medical concerns  Chair/bed transfer assist level: Moderate Assistance - Patient 50 - 74%     Locomotion Ambulation   Ambulation assist      Assist level: Maximal Assistance - Patient 25 - 49% Assistive device: Orthosis Max distance: 50   Walk 10 feet activity   Assist     Assist  level: Maximal Assistance - Patient 25 - 49% Assistive device: Orthosis, Walker-hemi   Walk 50 feet activity   Assist Walk 50 feet with 2 turns activity did not occur: Safety/medical concerns (Patient unable to ambulate >30' at this time secondary to fatigue, poor endurance/activity tolerance, impaired attention, increased pain, etc.)  Assist level: Maximal Assistance - Patient 25 - 49% Assistive device: Walker-hemi    Walk 150 feet activity   Assist Walk 150 feet activity did not occur: Safety/medical concerns         Walk 10 feet on uneven surface  activity   Assist Walk 10 feet on uneven surfaces activity did not occur: Safety/medical concerns         Wheelchair     Assist Is the patient using a wheelchair?: Yes Type of Wheelchair: Manual (per therapist, pt using TIS/Recline back wheelchair)    Wheelchair assist level: Dependent - Patient 0% Max wheelchair distance: 150'    Wheelchair 50 feet with 2 turns activity    Assist        Assist Level: Dependent - Patient 0%   Wheelchair 150 feet activity     Assist      Assist Level: Dependent - Patient 0%   Blood pressure 124/84, pulse 76, temperature 98.4 F (36.9 C), temperature source Oral, resp. rate 18, height 5\' 4"  (1.626 m), weight 51.2 kg, SpO2 100 %.  1. 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 Conchita Paris. Bone flap in abdominal pocket, plan for subacute flap replacement after IPR             -patient may  shower if cover incisions             -ELOS/Goals: supervision to min A;  6/5 goal DC date   Safety precautions due to poor impulse control - Helmet ordered for OOB 5/8; WHO and [facility] 5/9; AFO pending  Con't CIR PT, OT and SLP    - 5/14: Starting to advance L leg with some buckling, Min A goals seem appropriate. SLP attention, recall improving but family is  triggering and she has behaviors when family comes in during therapies.   5/15: CT head showing improved edema, small SDH,  and unchanged 8 mm midline shift  5/21: poor L attention but getting some functional grasp in LUE; L lateral lean complicating EOB balance; CGA-Min A transfer and Min-Mod transfers mostly c/b poor safety awareness. Walking up to 180 ft with handheld assist, can advance L leg but gets buckling in L knee  despite anterior support AFO.   5/23: Less impulsive over past week, will discuss with therapies and nursing today removing TeleSitter; Dced telesitter.   5/24: was somewhat impulsive overnight, needed reminders to stay in bed. Continue bed alarm and posey belt in WC.  - no further attempt OOB per weekend notes  5/28: Upgrading goals to CGA for OT; needs cueing with PT, quad strength improved buyt some poor awareness. Using small base quad cane now. Min A for steps. CGA goals. SLP trialling D3, still impulsive but doing better.   2 Antithrombotics: -DVT/anticoagulation:  Pharmaceutical: Lovenox initiated 07/04/2022             -antiplatelet therapy: N/A  3. Pain Management/neuropathic pain: Oxycodone as needed  5/8: added lidocaine patch x2 to L neck, shoulder  5/9: Add gabapentin 300 mg TID for LUE and LLE pain, likely related to spasticity and neuropathic.   5/10: Mild lethargy today but no use of PRN pain medications; decrease gabapentin to 100 mg TID  5/11: kpad and left shoulder sling ordered  5/23: Using oxycodone Q4H; continue  5/14: Using tylenol and oxycodone together consistently; schedule tylenol 1000 mg TID and oxycodone 5 mg TID; add oxycodone 5 mg BID Prn for breakthrough  -- Increase gabapentin to 200/200/400 mg daily  5/16: Increase gabapentin to 200/200/600 mg for pain and sleep  5/17 try voltaren gel L shoulder  5/18-5/19- denies pain this AM  5/20: Due to no improvement with prior measures, discontinue daytime gabapentin, standing Tylenol, and standing  oxycodone.  Changed headache regimen to Fioricet 1 tab 4 times daily as needed, and oxycodone 5 mg 4 times daily as needed for pain.  5/21 d/t daytime lethargy + poor pain control, change gabapentin 600 mg QHS to 200 mg TID. DC fioricet, resume scheduled tylenol 1000 mg TID. Continue PRN oxy  5/22: increase QHS gabapentin to 400 mg given increased sensitivity/pain after cutting back from 600 qhs. Add baclofen 5 mg TID PRN for spasms -improved pain control, continue this regimen, encouraged use of as needed baclofen for abdominal pain  5/27: Using Oxy mostly QHS; increase gabapentin to 600 mg QHS, decrease oxy to 5 mg BID PRN; schedule baclofen 5 mg QHS. Add Lidocaine patch over RLQ.  -With improvement   4. Mood/Behavior/Sleep: PRN twice daily as needed provide emotional support             -antipsychotic agents: N/A  - Sleep log added - poor sleep 3 hours last night. Add melatonin 5 mg QHS + PRN trazodone 50-75 mg.   5/10: Slept 7 hours overnight, still endorsing poor sleep.  Did not use as needed trazodone, will add 50 mg standing nightly.  5/11: continue trazodone  5/13: Per log slept 5 hours overnight, however was not updated 5-10: Will recheck tomorrow  5.14: slept 7 hours overnight; see above for increased QHS gabapentin  5/15: s sleep log not completed overnight, informed nursing.  Patient endorses ongoing poor sleep, no improvement with increase gabapentin.  5/16: interrupted sleep 7+ hours; increase trazodone to 75 mg and increase gabapentin as above  5/18-20 sleeping better  5/21: Worsening lethargy per therapies over last 2 days; possible etiologies include  medication induced, poor pain control, and metabolic/hyponatremia. DC fioricet, decrease nighttime gabapentin, stat CBC and BMP ordered. Sleep log remains interrupted but appropriate.   5/22: CBC, BMP WNL. Re-time sleep medications to 2000. Increase gabapentin QHS as above and Trazadone to 100 mg QHS.   5/28 - doing better with sleep,  monitor  5. Neuropsych/cognition: This patient is not capable of making decisions on her own behalf -not consistently oriented, impulsive with poor awareness..   - Obs for safety  5/19- con't Telesitter and NT for meals; Tele Dced 5/24  6. Skin/Wound Care: Routine skin checks  - 5/8: 3 weeks post-R craniectomy; ordered staple removal from scalp and abdomen. no apparent wounds will DC rectal tube    7. Dysphagia: placed order for Cortrak removal 5/12 Diet Orders (From admission, onward)     Start     Ordered   07/04/22 1303  DIET DYS 3 Room service appropriate? Yes with Assist; Fluid consistency: Thin; Fluid restriction: 1200 mL Fluid  Diet effective now       Comments: Meds whole with liquid; full supervision with meals  Question Answer Comment  Room service appropriate? Yes with Assist   Fluid consistency: Thin   Fluid restriction: 1200 mL Fluid      07/04/22 1302          5/13: Monitor POs with cortrak removal 5/18-22 eating well  5/28: Per SLP, trialing upgraded diet today   8.  Hyponatremia.  Continue sodium chloride tablets.  Florinef 0.3 mg 3 times daily  - 5/8: Na 134 this AM. Downtrending over last 2 days. No obvious medication contributors; Already on salt tabs 2g TID. Placed 1500 ml fluid restriction, repeat today 138. Ordered daily BMP x3 days for monitoring. Reduce to Florinef 0.1 mg TID and monitor.   5/9: Na stable 13. Diastolic BP improved, remains high systolic. Orthostatic vitals negative with BP. Given florinef indication is hyponatremia not orthostasis will continue 0.1 mg TID through Friday then DC if no significant drop in Na.   5-10: Na down to 135 with mildly increased lethargy.  Increase Florinef to 0.2 mg 3 times daily, continue daily BMP for 3 days.  5/12: Na improved to 136, decrease Florinef to 0.1mg  TID given hypertension  5/13: NA 136 with decreased Florinef; reduced dosing to twice daily and recheck BMP Wednesday  5/14: DC florinef d/t persistent  hypertension; labs pending  5/16: Na stable; will check today, then reduce Na starting tomorrow if appropriate with repeat labs Sunday  5/18- will recheck labs in AM/Sunday- ordered labs- will also reduce Na levels to 2G BID with meals from TID  5/19- Na 138, but reduction hasn't shown up yet, since got lunch NA yesterday- will recheck this week 5/20: NA low 134 with reduction, resume 2 g 3 times daily dosing and recheck on Wednesday 5/21: repeat BMP today stat d/t lethergy - NA 137 - stable - refused recheck 5/22 and 5/24 AM; giving option of today or tomorrow AM, but will need to check Na to continue titrating salt tabs 5/27: agreeable to labs today, re-ordered - stable, decrease salt tabs to 2 tabs BID; recheck Thursday     Latest Ref Rng & Units 07/11/2022   11:21 AM 07/05/2022    3:06 PM 07/04/2022    7:34 AM  BMP  Glucose 70 - 99 mg/dL 244  90  86   BUN 6 - 20 mg/dL 7  12  10    Creatinine 0.44 - 1.00 mg/dL 0.10  0.75  0.61   Sodium 135 - 145 mmol/L 137  137  134   Potassium 3.5 - 5.1 mmol/L 4.0  4.2  4.0   Chloride 98 - 111 mmol/L 104  102  99   CO2 22 - 32 mmol/L 23  23  24    Calcium 8.9 - 10.3 mg/dL 9.8  26.9  48.5     10.  Decreased nutritional storage.  Currently on a dysphagia #3 diet.  NGT d/ced   - 5/14: DC SSI, CBG  11.  Urinary retention.  Urecholine 5 mg 3 times daily.  Check PVR - resolved  5/8: No PVRs overnight; ordered. Mixed continence overnight.   5/9: Continue UO and low PVRs. DC urecholine.   5/10: PVRs remain low, continent.  Continue for 24 hours, then DC PVRs.  5/19- PVRs off- resolved  12.  History of tobacco/alcohol use.  Counseling  13. Diarrhea. Remove rectal tube 5/8. Increase banatrol to TID.     - 5/9: No recorded Bms since rectal tube removal; reduce banatrol back to BID, obtain KUB  - 5/10: DC banatrol. KUB with mild stool burden.  If no BM today, will need to start laxatives.   5/11: magnesium citrate ordered  5/12: flowsheet reviewd and had  type 5 stools today.   5/13: soft stools, last BM 5/12, montior  5/15: Liquid Bms 5/13; now on PO diet, monitor  5/16: add colace 100 mg BID + miralax daily PRN  5/17 schedule miralax  5/18- LBM x2   5/22: change colace to sennakot s 1 tab BID  5-23: Large BM this a.m.; monitor 5/26 cont BM   14. HTN: now controlled     07/12/2022    9:04 AM 07/12/2022    3:43 AM 07/11/2022    8:37 PM  Vitals with BMI  Systolic 124 123 462  Diastolic 84 82 71  Pulse  76 84    - 5/13: PRN hydralazine used 1x overnight; BP remains significantly elevated with decreased Florinef.  Add amlodipine 5 mg daily.  - 5/14: diastolic remains elevated; add lisinopril 5 mg daily. Will take 2-3 days for amlodipine effect. DC florinef - 5/15: Much improved;  DC lisinopril; Dc amlodipine this PM if remains normotensive 5/16: Diastolic remains elevated increase norvasc to 10 mg daily 5/17 controlled overall, continue current  5/18- 5/20- BP controlled- con't regimen  5/24: Mild intermittently tachycardia; encourage PO fluids  5/27: improved HR; monitor  15. L eye pain/pressure: Getting MRI brain and orbits w/o contrast d/t worsening symptoms and poor EOMI tolerance; should have been improving with decreased postop edema   - 5/16: MRI converted to CT d/t clips likely to obscure relevant areas. Found "Minimal hazy asymmetric stranding within the right intraorbitalfat, suspected to reflect a degree of passive venous congestion due to the evolving process within the right cerebral hemisphere." No focal s/s cellulitis, but will get CBC today for eval. No management changes, reassured patient and family.   5-20: Ongoing, Fioricet as above.  Labs appear appropriate, no concern for infection.  5/21: Adjusting pain regimen as above.  5/22-24: EOMI tolerance improving. Monitor.  5/27: EOMI WNL, no further complaints; resolved  16. Itching, generalized  -5/17 No rash noted, may be dry skin related, start moisturizer, continue  benadryl crem    - 5/24: topical benadryl to incision sites daily   17.  Posterior shoulder pain left side, discussed with patient this is common post stroke especially with the degree of weakness that she  is experiencing.  - Continue PT OT efforts  - continue Voltaren gel 4 times daily  - patient needs K-pad to be positioned over her shoulder.  - Taping , e-stim -Pain medications as above  LOS: 21 days A FACE TO FACE EVALUATION WAS PERFORMED  Angelina Sheriff 07/12/2022, 9:09 AM

## 2022-07-12 NOTE — Progress Notes (Signed)
Occupational Therapy Session Note  Patient Details  Name: Kristin Simmons MRN: [id number] Date of Birth: 12-03-1985  Today's Date: 07/12/2022 Session 1 OT Individual Time: 7829-5621 OT Individual Time Calculation (min): 70 min   Session 2 OT Individual Time: 1330-1415 OT Individual Time Calculation (min): 45 min    Short Term Goals: Week 3:  OT Short Term Goal 1 (Week 3): Patient will complete shower transfer with min A OT Short Term Goal 2 (Week 3): Patient will complete toilet transfer with min A OT Short Term Goal 3 (Week 3): Patient recall hemi dressing techniques with min questioning cues  Skilled Therapeutic Interventions/Progress Updates:  Session 1   Pt greeted seated upright in bed finishing breakfast with nurse tech providing supervision. Pt then completed bed mobility with HOB elevated and supervision. Donned shoes and AFO at EOB with min A for R  LE, and max to don L AFO. Pt ambulated into bathroom using small quad based cane and min A. Min A for balance when managing clothing. Pt voided bladder and completed peri-care in sitting with supervision. PT ambulated back out of bathroom with min A. Pt stood at the sink to wash hands with min A for balance. Pt brought to therapy gym in wc and addressed functional ambulation 30 feet w/ small quad based cane and overall min A and occasional cues for L LE foot clearance. L U E NMR using SAEBO arm support and picking up large foam cube. Pt with little wrist extension requiring support. Continued L UE NMR with cone stacking activity focused on grasp/release. max A to lift cone to stack, but initiating with some finger extension today to elicit release. Pt returned to room and was left seated in wc with alarm belt on, call bell in reach, and needs met. OT placed SAEBO e-stim on wrist extensors.Marland Kitchen SAEBO left on for 60 minutes. OT returned to remove SAEBO with skin intact and no adverse reactions.  [place] Stim One 330 pulse width 35 Hz pulse  rate On 8 sec/ off 8 sec Ramp up/ down 2 sec Symmetrical Biphasic wave form  Max intensity at 500 Ohm load  Pain: Pain Assessment Pain Scale: 0-10 Pain Score: 5  Pain Type: Acute pain Pain Location: Elbow Pain Orientation: Right Pain Descriptors / Indicators: Throbbing Pain Frequency: Intermittent Pain Intervention(s): Emotional support;Rest    Session 2 Pt greeted sitting in wc and was still up from previous PT session! Pt brought to therapy gym in wc. Stand-pivot to therapy mat with min A. Addressed L UE NMR with L UE placed on therapy ball, then pushing and pulling from scapula and shoulder. Pt with great activation in supported position. Encouraged L UE to maintain some wrist extension. Pt reported need to go to the bathroom. Worked on wc propulsion back to room with cues to stay off of the L side of the wall.  Pt ambulated into bathroom with small quad cane and  min A. CGA for balance to remove pants prior to sitting on toilet. Pt voided bladder and set-up for peri-care. OT placed L UE in supportive sling, then we ambulated in hallway with quad cane and min A. Pt returned to room and left semi-reclined in bed with bed alarm on, call bell in reach, and needs met.   Therapy Documentation Precautions:  Precautions Precautions: Fall Precaution Comments: R craniectomy, no bone flap, bone flap placed in abdomen, R hand mitt, coretrak, flexiseal Restrictions Weight Bearing Restrictions: No Pain:  Shoulder pain, no number  given. Rest and repositioned    Therapy/Group: Individual Therapy  Mal Amabile 07/12/2022, 2:52 PM

## 2022-07-12 NOTE — Progress Notes (Signed)
Nutrition Follow-up  DOCUMENTATION CODES:   Not applicable  INTERVENTION:  - Continue Dys3 diet, 1200 mL fluid.   NUTRITION DIAGNOSIS:   Inadequate oral intake related to poor appetite as evidenced by meal completion < 50%. - resolving   GOAL:   Patient will meet greater than or equal to 90% of their needs - poor   MONITOR:   PO intake, TF tolerance  REASON FOR ASSESSMENT:   Consult Enteral/tube feeding initiation and management  ASSESSMENT:   37 y.o. female admits to CIR related to functional deficits in setting of s/p right craniectomy. PMH includes: asthma, cancer, HTN, cervical cancer.  Meds reviewed: risaquad, folic acid, miralax, senokot, thiamine. Labs reviewed: WDL.   Pt ate 90% of her breakfast this am. Per record, pt has been eating mostly 50-100% of her meals over the past 7 days. Pt continues meeting her needs. RD will continue to monitor PO intakes.   Diet Order:   Diet Order             DIET DYS 3 Room service appropriate? Yes with Assist; Fluid consistency: Thin; Fluid restriction: 1200 mL Fluid  Diet effective now                   EDUCATION NEEDS:   Not appropriate for education at this time  Skin:  Skin Assessment: Reviewed RN Assessment  Last BM:  5/27 - type 6  Height:   Ht Readings from Last 1 Encounters:  06/22/22 5\' 4"  (1.626 m)    Weight:   Wt Readings from Last 1 Encounters:  07/06/22 51.2 kg    Ideal Body Weight:     BMI:  Body mass index is 19.38 kg/m.  Estimated Nutritional Needs:   Kcal:  1800-2000  Protein:  85-100  Fluid:  > 1.8 L/day  Bethann Humble, RD, LDN, CNSC.

## 2022-07-12 NOTE — Progress Notes (Signed)
Physical Therapy Session Note  Patient Details  Name: Kristin Simmons MRN: [id number] Date of Birth: November 21, 1985  Today's Date: 07/12/2022 PT Individual Time: 1st Treatment Session: [phone]; 2nd Treatment Session: 4782-9562 PT Individual Time Calculation (min): 45 min; 45 min   Short Term Goals: Week 3:  PT Short Term Goal 1 (Week 3): STG=LTGs secondary to ELOS  Skilled Therapeutic Interventions/Progress Updates:  1st Treatment Session- Patient greeted supine in bed and agreeable to PT treatment session. Patient transitioned to sitting EOB with use of bed rail and supv- While sitting EOB, therapist donned tennis shoes and Lt anterior AFO. Patient stood from EOB with use of SBQC and CGA for safety- Patient then performed stand pivot transfer to TIS wheelchair, however did not properly align herself with the chair or bring her Lt LE back while transferring and unsafely whipped her bottom into the chair- Therapist spent time educating patient on improved safety awareness and management of Lt LE with minimal improvements noted. Patient wheeled to rehab gym for time management.   Patient gait trained x109' with [facility] and CGA for safety- Minor VC for decreased Lt LE step length and increased Rt LE step length with good improvements noted.   Patient then ascended/descended x12-6" steps with R HR and CGA/MinA throughout for improved stability- Patient used a reciprocal pattern throughout in order to facilitate increased WB and use of Lt LE.   Patient transitioned into a standard wheelchair in order to address wheelchair mobility- Patient propelled manual wheelchair >150' with Rt UE/Rt LE with supv for safety as patient tends to run into obstacles on the Lt.   Patient left sitting upright in wheelchair with posey belt on, call bell within reach and all needs met.    2nd Treatment Session- Patient greeted sitting upright in wheelchair in room with mother and CSW present and agreeable to PT treatment  session. Patient wheeled from her room to main gym for time management and energy conservation.   Patient performed x10 sit/stands with Rt LE on 4" step in order to facilitate improved Lt lateral weight shift and forced use of Lt LE for improved WB and activation. Therapist providing CGA throughout with multimodal cues for improved postural extension and midline posturing upon standing- Mirror placed in front of patient for external visual cues regarding posturing.   Patient performed x10 sit/stands without UE support and CGA/MinA for stability- Multimodal cues and facilitation for improved Lt lateral weight shift throughout activity. Consistent VC for not using Rt UE to support.   Patient gait trained x109' with [facility] and CGA throughout- Same VC used as in earlier treatment session with increased cues for forward gaze and postural extension. Increased obstacles in hallway, however patient managed well.   Patient left sitting upright in wheelchair in room with posey belt on, call bell within reach, tray table in front and all needs met.    Therapy Documentation Precautions:  Precautions Precautions: Fall Precaution Comments: R craniectomy, no bone flap, bone flap placed in abdomen, R hand mitt, coretrak, flexiseal Restrictions Weight Bearing Restrictions: No   Pain: No/Denies pain- Lt shoulder propped with blue strap throughout treatment session.   Therapy/Group: Individual Therapy     07/12/2022, 7:51 AM

## 2022-07-12 NOTE — Patient Care Conference (Signed)
Inpatient RehabilitationTeam Conference and Plan of Care Update Date: 07/12/2022   Time: 10:49 AM    Patient Name: Kristin Simmons      Medical Record Number: [id number]  Date of Birth: Jan 21, 1986 Sex: Female         Room/Bed: 4W08C/4W08C-01 Payor Info: Payor: MEDICAID [place] / Plan: MEDICAID OF [place] / Product Type: *No Product type* /    Admit Date/Time:  06/21/2022  5:27 PM  Primary Diagnosis:  SAH (subarachnoid hemorrhage) [facility])  Hospital Problems: Principal Problem:   SAH (subarachnoid hemorrhage) (HCC) Active Problems:   Right internal carotid artery aneurysm   Mild cognitive impairment with memory loss    Expected Discharge Date: Expected Discharge Date: 07/20/22  Team Members Present: Physician leading conference: Dr. Elijah Birk Social Worker Present: Cecile Sheerer, LCSWA Nurse Present: Vedia Pereyra, RN PT Present: Amedeo Plenty, PT OT Present: Kearney Hard, OT SLP Present: Feliberto Gottron, SLP PPS Coordinator present : Fae Pippin, SLP     Current Status/Progress Goal Weekly Team Focus  Bowel/Bladder   Continent of bowel and bladder, LBM: 07/11/22   Remain continent of bowel and bladder with minimal assistance.   Assess bowel and bladder needs q shift and PRN and offer toileting q 2 hours while awake.    Swallow/Nutrition/ Hydration   Dys. 3 textures with thin liquids, Min A for use of swallowing strategies   Supervision  use of swallowing strategies, trials of regular textures    ADL's   Min A overall, getting some good return with L UE   Min A, upgrade to CGA this week   L UE NMR, NMES, balance, pt/family education, self-care retraining, dc planning    Mobility   Supv for bed mobility; CGA for sit/stands; CGA/MinA for transers and gait upt to 180' with SBQC; MinA for stair mobility   CGA/MinA with LRAD  Sitting balance, standing balance, gait, sit/stands, transfers, gait, stair mobility, L NMR, endurance/activity tolerance, attention, awareness,  pain, education, discharge planning, family education, etc.    Communication                Safety/Cognition/ Behavioral Observations  Min-Mod A to complete functional and familiar tasks   Supervision-Min A   selective attention, complex problem solving, recall with use of aids, scanning/attention to left, emergent awareness    Pain   Intermittent left shoulder pain, relieved by prn pain medication.   Pains < or = 2/10.   Assess pain q shift and PRN and provide PRN pain medication when needed.    Skin   Right abdominal surgical site- clean, dry, and intact and right craniectomy site- clean, dry, and intact   To remain free from skin breakdown and infection.  Assess skin q shift and prn      Discharge Planning:  D/c plan remains pt will discharge to home with support from her family, and children. Pt mother likley to help in the mornings due to working third shift. CAP/DA referral packet receibved, and documents signed with approprite clinicals, and faxed back to [facility]. Pt mother will need to manage disability as she initated an application and [facility] unable to assist now. PCS referral will be submitted closer towards discharge. Fam mtg held on Thursday (5/16) with pt mother and children's father. Children's father scheduled for fam edu 5/29, 30, 31 1pm-4pm. SW will confirm there are no barriers to discharge.   Team Discussion: SAH. Continent B&B. Addressing pain to left shoulder with scheduled medications and PRN medications. Helmet when  OOB. Sleeping well. D3/ 1200 fluid restriction. Will trial regular diet this week. Incisions to head and abdomen are healing well.  Patient on target to meet rehab goals: yes, is expected to be at goal level or greater by discharge.   *See Care Plan and progress notes for long and short-term goals.   Revisions to Treatment Plan:  Tele-sitter discontinued. Getting labs (she allowed blood to be drawn). Calling [employer] today for AFO as  orthotic has not been delivered. Has progressed from Tilt-n-space to regular wheelchair.  Teaching Needs: Medications, safety, self care, gait/transfer training, skin care, etc.   Current Barriers to Discharge: Decreased caregiver support  Possible Resolutions to Barriers: Family education, CAP paperwork submitted.      Medical Summary Current Status: medically complicated by parasthesias, L shoulder pain, R headache, hyponatremia, itching, hbehavior/mood, and insomnia  Barriers to Discharge: Behavior/Mood;Electrolyte abnormality;Medical stability;Self-care education;Uncontrolled Pain  Barriers to Discharge Comments: behavior/mood, LUE weakness and pain, headache, and hyponatremia Possible Resolutions to [employer]: Labs and titrating down supplemental sodium, medication assistance for sleep and pain control, positioning for LUE   Continued Need for Acute Rehabilitation Level of Care: The patient requires daily medical management by a physician with specialized training in physical medicine and rehabilitation for the following reasons: Direction of a multidisciplinary physical rehabilitation program to maximize functional independence : Yes Medical management of patient stability for increased activity during participation in an intensive rehabilitation regime.: Yes Analysis of laboratory values and/or radiology reports with any subsequent need for medication adjustment and/or medical intervention. : Yes   I attest that I was present, lead the team conference, and concur with the assessment and plan of the team.   Jearld Adjutant 07/12/2022, 3:44 PM

## 2022-07-12 NOTE — Progress Notes (Signed)
Patient ID: Kristin Simmons, female   DOB: 09-Nov-1985, 37 y.o.   MRN: [id number]  SW met with pt and pt mother in room to provide updates from team conference on gains made in rehab, d/c date remains 6/6, D/c recs-  w/c, TTB, and 3in1 BSC and outpatient therapies, and asked mother to confirm with Kristin Simmons if he will be here for family edu for the remainder of the week. PT mother provided SW with his number. SW called Kristin Simmons while family in room. Family edu confirmed. Preferred outpatient location- [facility].   SW ordered 3in1, TTB, and w/c with [facility] via parachute.   Cecile Sheerer, MSW, LCSWA Office: [phone] Cell: [phone] Fax: [phone]

## 2022-07-13 NOTE — Progress Notes (Addendum)
Occupational Therapy Session Note  Patient Details  Name: Kristin Simmons MRN: [id number] Date of Birth: 1986-01-21  Today's Date: 07/13/2022 OT Individual Time: 1330-1415 OT Individual Time Calculation (min): 45 min    Short Term Goals: Week 3:  OT Short Term Goal 1 (Week 3): Patient will complete shower transfer with min A OT Short Term Goal 2 (Week 3): Patient will complete toilet transfer with min A OT Short Term Goal 3 (Week 3): Patient recall hemi dressing techniques with min questioning cues  Skilled Therapeutic Interventions/Progress Updates:    Pt received supine with no c/o pain, agreeable to OT session. Family education session completed with pt and [name], her SO . Verbal education provided re fall risk reduction, energy conservation strategies, home carryover of transfer training, ADLs, and IADLs. Demonstration and hands on training completed for pt performance of UB/LB bathing and dressing at CGA- min A level, toileting hygiene and transfers, and shower transfers. Pt had one full LOB to the L when standing d/t poor placement of her LLE after completing a TTB transfer. Total A from OT and SO to recover fall. Provided education and demonstration on LUE PROM to SO. Emphasized poor insight into deficits and poor safety awareness. SO able to guard pt for 150 ft of functional mobility with a small base quad cane, CGA- min A provided. Pt left sitting up with all needs met, awaiting PT session.    Therapy Documentation Precautions:  Precautions Precautions: Fall Precaution Comments: R craniectomy, no bone flap, bone flap placed in abdomen, R hand mitt, coretrak, flexiseal Restrictions Weight Bearing Restrictions: No   Therapy/Group: Individual Therapy  Crissie Reese 07/13/2022, 6:43 AM

## 2022-07-13 NOTE — Progress Notes (Signed)
Speech Language Pathology Weekly Progress and Session Note  Patient Details  Name: Kristin Simmons MRN: [id number] Date of Birth: March 13, 1985  Beginning of progress report period: Jul 06, 2022 End of progress report period: Jul 13, 2022  Today's Date: 07/13/2022 SLP Individual Time: 4098-1191 SLP Individual Time Calculation (min): 42 min  Short Term Goals: Week 3: SLP Short Term Goal 1 (Week 3): Patient will consume current diet with minimal overt s/s of aspiration with supervision supervision level verbal cues for use of swallowing compensatory strategies. SLP Short Term Goal 1 - Progress (Week 3): Met SLP Short Term Goal 2 (Week 3): Patient will demonstrate sustained attention to functional tasks for 30 minutes with Min verbal cues for redirection. SLP Short Term Goal 2 - Progress (Week 3): Met SLP Short Term Goal 3 (Week 3): Patient will attend to left visual field during functional tasks with Min A multimodal cues. SLP Short Term Goal 3 - Progress (Week 3): Met SLP Short Term Goal 4 (Week 3): Patient will demonstrate functional problem solving for mildly complex tasks with Min verbal cues. SLP Short Term Goal 4 - Progress (Week 3): Met SLP Short Term Goal 5 (Week 3): Patient will self-monitor and correct errors during functional tasks with Min verbal and visual cues. SLP Short Term Goal 5 - Progress (Week 3): Met SLP Short Term Goal 6 (Week 3): Patient will recall new, daily information with Mod multimodal cues. SLP Short Term Goal 6 - Progress (Week 3): Met    New Short Term Goals: Week 4: SLP Short Term Goal 1 (Week 4): STGs=LTGs due to ELOS  Weekly Progress Updates: Patient continues to make functional gains and has met 6 of 6 STGs this reporting period. Currently, patient is consuming Dys. 3 textures with thin liquids with minimal overt s/s of aspiration with supervision level verbal cues for use of swallowing compensatory strategies. Trials of regular textures with potential  upgrade is planned for this upcoming week. Overall Min A verbal cues are also needed for patient to complete functional and familiar tasks safely in regards to problem solving, selective attention, emergent awareness, and recall. Patient and family education ongoing. Patient would benefit from continued skilled SLP intervention to maximize her cognitive and swallowing function prior to discharge.      Intensity: Minumum of 1-2 x/day, 30 to 90 minutes Frequency: 1 to 3 out of 7 days Duration/Length of Stay: 07/20/22 Treatment/Interventions: Cognitive remediation/compensation;Cueing hierarchy;Environmental controls;Functional tasks;Internal/external aids;Patient/family education;Therapeutic Activities;Dysphagia/aspiration precaution training   Daily Session  Skilled Therapeutic Interventions:  Skilled treatment session focused on dysphagia and cognitive goals. SLP facilitated session by providing a snack of regular textures to work towards diet advancement. Patient impulsive with self-feeding despite Mod verbal cues resulting in 2 subtle coughing episodes. Recommend trial tray prior to upgrade. Patient demonstrated decreased attention to tasks today with reports of pain. Patient required Min verbal cues for redirection to tasks and for basic problem solving. Patient requested to get back to bed at end of session and required Min verbal cues for safety due to impulsivity. Patient left upright in bed with alarm on and all needs within reach. Continue with current plan of care.     Pain Pain in neck, patient pre-medicated   Therapy/Group: Individual Therapy  ,  07/13/2022, 6:41 AM

## 2022-07-13 NOTE — Progress Notes (Signed)
Physical Therapy Weekly Progress Note  Patient Details  Name: Kristin Simmons MRN: [id number] Date of Birth: 1985-06-17  Beginning of progress report period: Jun 22, 2022 End of progress report period: Jul 13, 2022  Today's Date: 07/13/2022 PT Individual Time: 1st Treatment Session: [phone]; 2nd Treatment Session: 1030-1100; 3rd Treatment Session: 1430-1530  PT Individual Time Calculation (min): 45 min; 30 min; 60 min  No new STGs were created for this patient secondary to ELOS and CLOF. Patient currently requires CGA/MinA for all functional mobility with the use of a SBQC. Patient is scheduled for an AFO consult on Thursday, 5/30 and family training scheduled for this week in order to ensure a safe discharge home.   Patient continues to demonstrate the following deficits muscle weakness, decreased cardiorespiratoy endurance, impaired timing and sequencing, abnormal tone, decreased coordination, and decreased motor planning, decreased visual perceptual skills, decreased midline orientation and decreased attention to left, decreased attention, decreased awareness, decreased problem solving, decreased safety awareness, decreased memory, and delayed processing, impaired sensation on hemi side (LUE/LE), and decreased sitting balance, decreased standing balance, decreased postural control, hemiplegia, and decreased balance strategies and therefore will continue to benefit from skilled PT intervention to increase functional independence with mobility.  Patient progressing toward long term goals..  Continue plan of care. Patient will continue to require hands-on assistance for safety at home secondary to poor awareness and overall safety- Family training to be completed this week in preparation for a safe home discharge.   PT Short Term Goals Week 1:  PT Short Term Goal 1 (Week 1): Patient will perform bed mobility with MinA PT Short Term Goal 1 - Progress (Week 1): Met PT Short Term Goal 2 (Week 1):  Patient will perform bed/chair transfer with ModA x1 PT Short Term Goal 2 - Progress (Week 1): Met PT Short Term Goal 3 (Week 1): Patient will gait train x50' with LRAD and MaxA x1 PT Short Term Goal 3 - Progress (Week 1): Met PT Short Term Goal 4 (Week 1): Patient will be able to tolerate sitting upright in TIS wheelchair between treatment sessions PT Short Term Goal 4 - Progress (Week 1): Met Week 2:  PT Short Term Goal 1 (Week 2): Patient will complete bed/chair transfer with MinA PT Short Term Goal 1 - Progress (Week 2): Met PT Short Term Goal 2 (Week 2): Patient will ambulate x50' with LRAD and ModA x1 PT Short Term Goal 2 - Progress (Week 2): Met PT Short Term Goal 3 (Week 2): Patient will initiate stair training PT Short Term Goal 3 - Progress (Week 2): Met Week 3:  PT Short Term Goal 1 (Week 3): STG=LTGs secondary to ELOS Week 4:  PT Short Term Goal 1 (Week 4): STG=LTGs secondary to ELOS  Skilled Therapeutic Interventions/Progress Updates: 1st Treatment Session-  Patient greeted supine in bed and agreeable to PT treatment session- patient requesting to shower this morning secondary to reports of scalp and body itching. Patient transitioned from supine to sitting EOB with use of R bed rail and supv. Patient gait trained from EOB to commode in shower with SBQC and CGA (no brace or shoes donned for gait trials). Patient required assistance for doffing clothing secondary to time constraints and inability to use LUE. Patient remained seated for the entire shower while therapist assisted for bathing RUE and back. Patient ambulated back to bed where she was able to thread underwear with set-up assistance and don shirt with MinA for guiding LUE and VC for  hemi-body technique. Therapist threaded pants for time management. Patient performed stand pivot transfer to wheelchair with [employer] and CGA- MAX VC for improved sequencing and safety awareness, as well as bringing her LLE all the way to the wc prior  to sitting. RN present to administer morning medications. Patient left sitting upright in wheelchair with tray table in front, call bell within reach and all needs met and transitioned to SLP care.   Saebo left on patient's Lt quad muscles for neuro re-ed.  Saebo Stim One 330 pulse width 35 Hz pulse rate On 8 sec/ off 8 sec Ramp up/ down 2 sec Symmetrical Biphasic wave form  Max intensity at 500 Ohm load (Patient tolerated ~30 minutes).   2nd Treatment Session- Patient greeted supine in bed and agreeable to PT treatment session. Patient transitioned to EOB with Supv and without the use of the bed rail. While sitting EOB, therapist donned socks/shoes and Lt anterior AFO for time management. Patient stood from EOB with SBQC and CGA- MAX VC for improved positioning of Lt LE and hand placement prior to standing in order to improve overall mechanics. Patient gait trained to/from her room and day room >170' with [facility] and CGA/MinA throughout. Patient with one instance of minor LOB requiring steadying assistance from therapist due to poor awareness with L lateral lean. VC throughout for decreased step length of LLE and increased RLE step length with good improvements noted. Patient returned to her room and left supine in bed with bed alarm on, call bell within reach, friend present and all needs met.    3rd Treatment Session- Patient greeted sitting upright in wheelchair in her room with significant other, [name], present and agreeable to PT treatment session. Focus of treatment session was hands-on family training in order to ensure a safe discharge home. Therapist provided education regarding equipments needs/DME ordered, wearing of helmet, Lt AFO and sling/strap when OOB with mobility, clearing the floors at home of area rugs, clutter, etc, OP PT, wheelchair mobility/management, CLOF, and projected level of function upon discharge. All questions were answered throughout treatment session. Patient  performed gait, sit/stands, transfers, car transfer, ramp mobility, wheelchair mobility, stair mobility and bed mobility throughout family training. Patient used [facility] with all functional mobility and CGA/MinA from [place] with good VC and safety awareness. Therapist spent time educating [name] on allowing the patient to perform all of the functional mobility as he is only there for safety and to provide cues for improved awareness of Lt LE throughout functional mobility tasks with good improvements in assistance and cues noted. Patient has a flight of stairs at home with Lt HR so practiced ascending with [facility] and CGA/MinA with a step-to pattern and descending with HR with good stability and safety noted. Patient returned to her room and transitioned to supine in bed. Patient left supine in bed with bed alarm on, call bell within reach, Timothy present and all needs met.   Therapy Documentation Precautions:  Precautions Precautions: Fall Precaution Comments: R craniectomy, no bone flap, bone flap placed in abdomen, R hand mitt, coretrak, flexiseal Restrictions Weight Bearing Restrictions: No   Pain: No/Denies pain- Lt UE uncomfortable, however improves with positioning.    Therapy/Group: Individual Therapy     07/13/2022, 7:56 AM

## 2022-07-13 NOTE — Progress Notes (Signed)
Patient ID: Kristin Simmons, female   DOB: 06/29/1985, 37 y.o.   MRN: [id number]  SW faxed outpatient PT/OT/SLP referral to [facility] (p:[phone]/[phone]).  Cecile Sheerer, MSW, LCSWA Office: [phone] Cell: [phone] Fax: [phone]

## 2022-07-13 NOTE — Progress Notes (Signed)
PROGRESS NOTE   Subjective/Complaints:  No events overnight.  Complaining of bilateral jaw pain when yawning.  Also states that she thinks there is a needle in her left neck.  Reinforced that there is no needle, and no apparent abnormalities on exam.  Otherwise, she is doing well.  Is trialing regular consistency food with SLP on exam, tolerating well.  ROS:  R eye pain -no further complaints L sided sensory loss/pain - improving  Itching -no further complaints Jaw pain-new Denies fevers, chills, N/V, abdominal pain, constipation, diarrhea, SOB, cough, chest pain, new weakness  Objective:   No results found. Recent Labs    07/11/22 1121  WBC 7.3  HGB 12.8  HCT 39.2  PLT 253    Recent Labs    07/11/22 1121  NA 137  K 4.0  CL 104  CO2 23  GLUCOSE 100*  BUN 7  CREATININE 0.66  CALCIUM 9.8     Intake/Output Summary (Last 24 hours) at 07/13/2022 0819 Last data filed at 07/13/2022 0744 Gross per 24 hour  Intake 413 ml  Output 250 ml  Net 163 ml         Physical Exam: Vital Signs Blood pressure 134/84, pulse 87, temperature 98 F (36.7 C), resp. rate 18, height 5\' 4"  (1.626 m), weight 51.2 kg, SpO2 100 %.  General: No acute distress.  Sitting in SLP office, eating chips. Heart: Regular rate and rhythm no rubs murmurs or extra sounds Lungs: Clear to auscultation, breathing unlabored, no rales or wheezes Abdomen: Positive bowel sounds, soft nontender to palpation, nondistended Extremities: No clubbing, cyanosis, or edema Skin:  R craniectomy - cdi -no swelling.  Healing well. R abdominal surgical sites - healing,  CDI.    MSK: + Tenderness to palpation of the long left neck anterior lymph node chain, without palpable enlarged lymph nodes.  Mild tenderness to palpation on bilateral TMJ temporomandibular joints.  No crepitus, no apparent deformity.     No apparent subluxed left shoulder no pain on  palpation-unchanged.  In sling.      Strength: Exam unchanged on 5/25                RUE: 5/5 SA, 5/5 EF, 5/5 EE, 5/5 WE, 5/5 FF, 5/5 FA                 LUE:  2/5 FF, 1/5 FA, 2/5 EF, 2/5 EE -unchanged                RLE: 5/5 HF, 5/5 KE, 5/5 DF, 5/5 EHL, 5/5 PF                 LLE:  3/5 KE, +LLE AFO, 2/5 toe flexion/extension  Neurologic exam:  Sensation: Altered sensation to light touch in left upper extremity and left lower extremity -no longer painful or hypersensitive-ongoing CN: 2 through 12 intact    Assessment/Plan: 1. Functional deficits which require 3+ hours per day of interdisciplinary therapy in a comprehensive inpatient rehab setting. Physiatrist is providing close team supervision and 24 hour management of active medical problems listed below. Physiatrist and rehab team continue to assess barriers to discharge/monitor patient progress toward  functional and medical goals  Care Tool:  Bathing    Body parts bathed by patient: Left arm, Chest, Abdomen, Right upper leg   Body parts bathed by helper: Right arm, Front perineal area, Buttocks, Left upper leg, Right lower leg, Left lower leg, Face     Bathing assist Assist Level: Moderate Assistance - Patient 50 - 74%     Upper Body Dressing/Undressing Upper body dressing   What is the patient wearing?: Pull over shirt    Upper body assist Assist Level: Moderate Assistance - Patient 50 - 74%    Lower Body Dressing/Undressing Lower body dressing      What is the patient wearing?: Pants     Lower body assist Assist for lower body dressing: Maximal Assistance - Patient 25 - 49%     Toileting Toileting    Toileting assist Assist for toileting: Maximal Assistance - Patient 25 - 49%     Transfers Chair/bed transfer  Transfers assist  Chair/bed transfer activity did not occur: Safety/medical concerns  Chair/bed transfer assist level: Moderate Assistance - Patient 50 - 74%      Locomotion Ambulation   Ambulation assist      Assist level: Maximal Assistance - Patient 25 - 49% Assistive device: Orthosis Max distance: 50   Walk 10 feet activity   Assist     Assist level: Maximal Assistance - Patient 25 - 49% Assistive device: Orthosis, Walker-hemi   Walk 50 feet activity   Assist Walk 50 feet with 2 turns activity did not occur: Safety/medical concerns (Patient unable to ambulate >30' at this time secondary to fatigue, poor endurance/activity tolerance, impaired attention, increased pain, etc.)  Assist level: Maximal Assistance - Patient 25 - 49% Assistive device: Walker-hemi    Walk 150 feet activity   Assist Walk 150 feet activity did not occur: Safety/medical concerns         Walk 10 feet on uneven surface  activity   Assist Walk 10 feet on uneven surfaces activity did not occur: Safety/medical concerns         Wheelchair     Assist Is the patient using a wheelchair?: Yes Type of Wheelchair: Manual (per therapist, pt using TIS/Recline back wheelchair)    Wheelchair assist level: Dependent - Patient 0% Max wheelchair distance: 150'    Wheelchair 50 feet with 2 turns activity    Assist        Assist Level: Dependent - Patient 0%   Wheelchair 150 feet activity     Assist      Assist Level: Dependent - Patient 0%   Blood pressure 134/84, pulse 87, temperature 98 F (36.7 C), resp. rate 18, height 5\' 4"  (1.626 m), weight 51.2 kg, SpO2 100 %.  1. 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 Conchita Paris. Bone flap in abdominal pocket, plan for subacute flap replacement after IPR             -patient may  shower if cover incisions             -ELOS/Goals: supervision to min A;  6/5 goal DC date   Safety precautions due to poor impulse control - Helmet ordered for OOB 5/8; WHO and  [facility] 5/9; AFO pending  Con't CIR PT, OT and SLP    - 5/14: Starting to advance L leg with some buckling, Min A goals seem appropriate. SLP attention,  recall improving but family is triggering and she has behaviors when family comes in during therapies.   5/15: CT head showing improved edema, small SDH, and unchanged 8 mm midline shift  5/21: poor L attention but getting some functional grasp in LUE; L lateral lean complicating EOB balance; CGA-Min A transfer and Min-Mod transfers mostly c/b poor safety awareness. Walking up to 180 ft with handheld assist, can advance L leg but gets buckling in L knee  despite anterior support AFO.   5/23: Less impulsive over past week, will discuss with therapies and nursing today removing TeleSitter; Dced telesitter.   5/24: was somewhat impulsive overnight, needed reminders to stay in bed. Continue bed alarm and posey belt in WC.  - no further attempt OOB per weekend notes  5/28: Upgrading goals to CGA for OT; needs cueing with PT, quad strength improved buyt some poor awareness. Using small base quad cane now. Min A for steps. CGA goals. SLP trialling D3, still impulsive but doing better.   2 Antithrombotics: -DVT/anticoagulation:  Pharmaceutical: Lovenox initiated 07/04/2022             -antiplatelet therapy: N/A  3. Pain Management/neuropathic pain: Oxycodone as needed  5/8: added lidocaine patch x2 to L neck, shoulder  5/9: Add gabapentin 300 mg TID for LUE and LLE pain, likely related to spasticity and neuropathic.   5/10: Mild lethargy today but no use of PRN pain medications; decrease gabapentin to 100 mg TID  5/11: kpad and left shoulder sling ordered  5/23: Using oxycodone Q4H; continue  5/14: Using tylenol and oxycodone together consistently; schedule tylenol 1000 mg TID and oxycodone 5 mg TID; add oxycodone 5 mg BID Prn for breakthrough  -- Increase gabapentin to 200/200/400 mg daily  5/16: Increase gabapentin to 200/200/600 mg for pain and  sleep  5/17 try voltaren gel L shoulder  5/18-5/19- denies pain this AM  5/20: Due to no improvement with prior measures, discontinue daytime gabapentin, standing Tylenol, and standing oxycodone.  Changed headache regimen to Fioricet 1 tab 4 times daily as needed, and oxycodone 5 mg 4 times daily as needed for pain.  5/21 d/t daytime lethargy + poor pain control, change gabapentin 600 mg QHS to 200 mg TID. DC fioricet, resume scheduled tylenol 1000 mg TID. Continue PRN oxy  5/22: increase QHS gabapentin to 400 mg given increased sensitivity/pain after cutting back from 600 qhs. Add baclofen 5 mg TID PRN for spasms -improved pain control, continue this regimen, encouraged use of as needed baclofen for abdominal pain  5/27: Using Oxy mostly QHS; increase gabapentin to 600 mg QHS, decrease oxy to 5 mg BID PRN; schedule baclofen 5 mg QHS. Add Lidocaine patch over RLQ.  -With improvement   4. Mood/Behavior/Sleep: PRN twice daily as needed provide emotional support             -antipsychotic agents: N/A  - Sleep log added - poor sleep 3 hours last night. Add melatonin 5 mg QHS + PRN trazodone 50-75 mg.   5/10: Slept 7 hours overnight, still endorsing poor sleep.  Did not use as needed trazodone, will add 50 mg standing nightly.  5/11: continue trazodone  5/13: Per log slept 5 hours overnight, however was not updated 5-10: Will recheck tomorrow  5.14: slept 7 hours overnight; see above for increased QHS gabapentin  5/15: s sleep log not completed overnight, informed nursing.  Patient endorses ongoing poor sleep, no improvement with increase gabapentin.  5/16: interrupted sleep 7+ hours; increase  trazodone to 75 mg and increase gabapentin as above  5/18-20 sleeping better  5/21: Worsening lethargy per therapies over last 2 days; possible etiologies include medication induced, poor pain control, and metabolic/hyponatremia. DC fioricet, decrease nighttime gabapentin, stat CBC and BMP ordered. Sleep log  remains interrupted but appropriate.   5/22: CBC, BMP WNL. Re-time sleep medications to 2000. Increase gabapentin QHS as above and Trazadone to 100 mg QHS.   5/28 - doing better with sleep, monitor  5. Neuropsych/cognition: This patient is not capable of making decisions on her own behalf -not consistently oriented, impulsive with poor awareness..   - Obs for safety  5/19- con't Telesitter and NT for meals; Tele Dced 5/24  6. Skin/Wound Care: Routine skin checks  - 5/8: 3 weeks post-R craniectomy; ordered staple removal from scalp and abdomen. no apparent wounds will DC rectal tube    7. Dysphagia: placed order for Cortrak removal 5/12 Diet Orders (From admission, onward)     Start     Ordered   07/04/22 1303  DIET DYS 3 Room service appropriate? Yes with Assist; Fluid consistency: Thin; Fluid restriction: 1200 mL Fluid  Diet effective now       Comments: Meds whole with liquid; full supervision with meals  Question Answer Comment  Room service appropriate? Yes with Assist   Fluid consistency: Thin   Fluid restriction: 1200 mL Fluid      07/04/22 1302          5/13: Monitor POs with cortrak removal 5/18-22 eating well  5/28: Per SLP, trialing upgraded diet today; trialing regular textures 5-29   8.  Hyponatremia.  Continue sodium chloride tablets.  Florinef 0.3 mg 3 times daily  - 5/8: Na 134 this AM. Downtrending over last 2 days. No obvious medication contributors; Already on salt tabs 2g TID. Placed 1500 ml fluid restriction, repeat today 138. Ordered daily BMP x3 days for monitoring. Reduce to Florinef 0.1 mg TID and monitor.   5/9: Na stable 13. Diastolic BP improved, remains high systolic. Orthostatic vitals negative with BP. Given florinef indication is hyponatremia not orthostasis will continue 0.1 mg TID through Friday then DC if no significant drop in Na.   5-10: Na down to 135 with mildly increased lethargy.  Increase Florinef to 0.2 mg 3 times daily, continue daily  BMP for 3 days.  5/12: Na improved to 136, decrease Florinef to 0.1mg  TID given hypertension  5/13: NA 136 with decreased Florinef; reduced dosing to twice daily and recheck BMP Wednesday  5/14: DC florinef d/t persistent hypertension; labs pending  5/16: Na stable; will check today, then reduce Na starting tomorrow if appropriate with repeat labs Sunday  5/18- will recheck labs in AM/Sunday- ordered labs- will also reduce Na levels to 2G BID with meals from TID  5/19- Na 138, but reduction hasn't shown up yet, since got lunch NA yesterday- will recheck this week 5/20: NA low 134 with reduction, resume 2 g 3 times daily dosing and recheck on Wednesday 5/21: repeat BMP today stat d/t lethergy - NA 137 - stable - refused recheck 5/22 and 5/24 AM; giving option of today or tomorrow AM, but will need to check Na to continue titrating salt tabs 5/27: agreeable to labs today, re-ordered - stable, decrease salt tabs to 2 tabs BID; recheck Thursday     Latest Ref Rng & Units 07/11/2022   11:21 AM 07/05/2022    3:06 PM 07/04/2022    7:34 AM  BMP  Glucose  70 - 99 mg/dL 161  90  86   BUN 6 - 20 mg/dL 7  12  10    Creatinine 0.44 - 1.00 mg/dL 0.96  0.45  4.09   Sodium 135 - 145 mmol/L 137  137  134   Potassium 3.5 - 5.1 mmol/L 4.0  4.2  4.0   Chloride 98 - 111 mmol/L 104  102  99   CO2 22 - 32 mmol/L 23  23  24    Calcium 8.9 - 10.3 mg/dL 9.8  81.1  91.4     10.  Decreased nutritional storage.  Currently on a dysphagia #3 diet.  NGT d/ced   - 5/14: DC SSI, CBG  11.  Urinary retention.  Urecholine 5 mg 3 times daily.  Check PVR - resolved  5/8: No PVRs overnight; ordered. Mixed continence overnight.   5/9: Continue UO and low PVRs. DC urecholine.   5/10: PVRs remain low, continent.  Continue for 24 hours, then DC PVRs.  5/19- PVRs off- resolved  12.  History of tobacco/alcohol use.  Counseling  13. Diarrhea. Remove rectal tube 5/8. Increase banatrol to TID.     - 5/9: No recorded Bms since  rectal tube removal; reduce banatrol back to BID, obtain KUB  - 5/10: DC banatrol. KUB with mild stool burden.  If no BM today, will need to start laxatives.   5/11: magnesium citrate ordered  5/12: flowsheet reviewd and had type 5 stools today.   5/13: soft stools, last BM 5/12, montior  5/15: Liquid Bms 5/13; now on PO diet, monitor  5/16: add colace 100 mg BID + miralax daily PRN  5/17 schedule miralax  5/18- LBM x2   5/22: change colace to sennakot s 1 tab BID  5-23: Large BM this a.m.; monitor 5/27 LBM , large  14. HTN: now controlled     07/13/2022    5:51 AM 07/12/2022    7:46 PM 07/12/2022    2:14 PM  Vitals with BMI  Systolic 134 126 782  Diastolic 84 76 69  Pulse 87 89 89    - 5/13: PRN hydralazine used 1x overnight; BP remains significantly elevated with decreased Florinef.  Add amlodipine 5 mg daily.  - 5/14: diastolic remains elevated; add lisinopril 5 mg daily. Will take 2-3 days for amlodipine effect. DC florinef - 5/15: Much improved;  DC lisinopril; Dc amlodipine this PM if remains normotensive 5/16: Diastolic remains elevated increase norvasc to 10 mg daily 5/17 controlled overall, continue current  5/18- 5/20- BP controlled- con't regimen  5/24: Mild intermittently tachycardia; encourage PO fluids  5/27: improved HR; monitor  15. L eye pain/pressure: Getting MRI brain and orbits w/o contrast d/t worsening symptoms and poor EOMI tolerance; should have been improving with decreased postop edema   - 5/16: MRI converted to CT d/t clips likely to obscure relevant areas. Found "Minimal hazy asymmetric stranding within the right intraorbitalfat, suspected to reflect a degree of passive venous congestion due to the evolving process within the right cerebral hemisphere." No focal s/s cellulitis, but will get CBC today for eval. No management changes, reassured patient and family.   5-20: Ongoing, Fioricet as above.  Labs appear appropriate, no concern for infection.  5/21:  Adjusting pain regimen as above.  5/22-24: EOMI tolerance improving. Monitor.  5/27: EOMI WNL, no further complaints; resolved  16. Itching, generalized  -5/17 No rash noted, may be dry skin related, start moisturizer, continue benadryl crem    - 5/24: topical  benadryl to incision sites daily   17.  Posterior shoulder pain left side, discussed with patient this is common post stroke especially with the degree of weakness that she is experiencing.  - Continue PT OT efforts  - continue Voltaren gel 4 times daily  - patient needs K-pad to be positioned over her shoulder.  - Taping , e-stim -Pain medications as above  LOS: 22 days A FACE TO FACE EVALUATION WAS PERFORMED  Kristin Simmons 07/13/2022, 8:19 AM

## 2022-07-14 MED ORDER — BACLOFEN 5 MG HALF TABLET
5.0000 mg | ORAL_TABLET | Freq: Two times a day (BID) | ORAL | Status: DC
  Administered 2022-07-14 – 2022-07-20 (×12): 5 mg via ORAL
  Filled 2022-07-14 (×12): qty 1

## 2022-07-14 MED ORDER — ACETAMINOPHEN 325 MG PO TABS
650.0000 mg | ORAL_TABLET | Freq: Four times a day (QID) | ORAL | Status: DC | PRN
  Administered 2022-07-15: 650 mg via ORAL
  Filled 2022-07-14: qty 2

## 2022-07-14 NOTE — Progress Notes (Signed)
PROGRESS NOTE   Subjective/Complaints:  No events overnight.  No acute complaints.  Going to gym to do group dance therapy. ROS:  R eye pain -no further complaints L sided sensory loss/pain - improving  Itching - intermittent, ongoing Denies fevers, chills, N/V, abdominal pain, constipation, diarrhea, SOB, cough, chest pain, new weakness  Objective:   No results found. No results for input(s): "WBC", "HGB", "HCT", "PLT" in the last 72 hours.  No results for input(s): "NA", "K", "CL", "CO2", "GLUCOSE", "BUN", "CREATININE", "CALCIUM" in the last 72 hours.   Intake/Output Summary (Last 24 hours) at 07/14/2022 1404 Last data filed at 07/14/2022 1153 Gross per 24 hour  Intake 551 ml  Output 550 ml  Net 1 ml         Physical Exam: Vital Signs Blood pressure 115/71, pulse 90, temperature 98.5 F (36.9 C), resp. rate 19, height 5\' 4"  (1.626 m), weight 51.2 kg, SpO2 97 %.  General: No acute distress.  In wheelchair, on way to therapy Heart: Regular rate and rhythm no rubs murmurs or extra sounds Lungs: Clear to auscultation, breathing unlabored, no rales or wheezes Abdomen: Positive bowel sounds, soft nontender to palpation, nondistended Extremities: No clubbing, cyanosis, or edema Skin:  R craniectomy - cdi -no swelling.  Healing well. R abdominal surgical sites - healing,  CDI.    MSK:     No apparent subluxed left shoulder no pain on palpation-unchanged.  And hand splint.      Strength: Exam unchanged on 5/25                RUE: 5/5 SA, 5/5 EF, 5/5 EE, 5/5 WE, 5/5 FF, 5/5 FA                 LUE:  2/5 FF, 1/5 FA, 2/5 EF, 2/5 EE -unchanged                RLE: 5/5 HF, 5/5 KE, 5/5 DF, 5/5 EHL, 5/5 PF                 LLE:  3/5 KE, +LLE AFO, 2/5 toe flexion/extension-unchanged  Neurologic exam:  Sensation: Altered sensation to light touch in left upper extremity and left lower extremity -no longer painful or  hypersensitive-stable CN: 2 through 12 intact    Assessment/Plan: 1. Functional deficits which require 3+ hours per day of interdisciplinary therapy in a comprehensive inpatient rehab setting. Physiatrist is providing close team supervision and 24 hour management of active medical problems listed below. Physiatrist and rehab team continue to assess barriers to discharge/monitor patient progress toward functional and medical goals  Care Tool:  Bathing    Body parts bathed by patient: Left arm, Chest, Abdomen, Right upper leg   Body parts bathed by helper: Right arm, Front perineal area, Buttocks, Left upper leg, Right lower leg, Left lower leg, Face     Bathing assist Assist Level: Moderate Assistance - Patient 50 - 74%     Upper Body Dressing/Undressing Upper body dressing   What is the patient wearing?: Pull over shirt    Upper body assist Assist Level: Moderate Assistance - Patient 50 - 74%  Lower Body Dressing/Undressing Lower body dressing      What is the patient wearing?: Pants     Lower body assist Assist for lower body dressing: Maximal Assistance - Patient 25 - 49%     Toileting Toileting    Toileting assist Assist for toileting: Maximal Assistance - Patient 25 - 49%     Transfers Chair/bed transfer  Transfers assist  Chair/bed transfer activity did not occur: Safety/medical concerns  Chair/bed transfer assist level: Moderate Assistance - Patient 50 - 74%     Locomotion Ambulation   Ambulation assist      Assist level: Maximal Assistance - Patient 25 - 49% Assistive device: Orthosis Max distance: 50   Walk 10 feet activity   Assist     Assist level: Maximal Assistance - Patient 25 - 49% Assistive device: Orthosis, Walker-hemi   Walk 50 feet activity   Assist Walk 50 feet with 2 turns activity did not occur: Safety/medical concerns (Patient unable to ambulate >30' at this time secondary to fatigue, poor endurance/activity  tolerance, impaired attention, increased pain, etc.)  Assist level: Maximal Assistance - Patient 25 - 49% Assistive device: Walker-hemi    Walk 150 feet activity   Assist Walk 150 feet activity did not occur: Safety/medical concerns         Walk 10 feet on uneven surface  activity   Assist Walk 10 feet on uneven surfaces activity did not occur: Safety/medical concerns         Wheelchair     Assist Is the patient using a wheelchair?: Yes Type of Wheelchair: Manual (per therapist, pt using TIS/Recline back wheelchair)    Wheelchair assist level: Dependent - Patient 0% Max wheelchair distance: 150'    Wheelchair 50 feet with 2 turns activity    Assist        Assist Level: Dependent - Patient 0%   Wheelchair 150 feet activity     Assist      Assist Level: Dependent - Patient 0%   Blood pressure 115/71, pulse 90, temperature 98.5 F (36.9 C), resp. rate 19, height 5\' 4"  (1.626 m), weight 51.2 kg, SpO2 97 %.  1. 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 Conchita Paris. Bone flap in abdominal pocket, plan for subacute flap replacement after IPR             -patient may  shower if cover incisions             -ELOS/Goals: supervision to min A;  6/5 goal DC date   Safety precautions due to poor impulse control - Helmet ordered for OOB 5/8; WHO and [facility] 5/9; AFO pending  Con't CIR PT, OT and SLP    - 5/14: Starting to advance L leg with some buckling, Min A goals seem appropriate. SLP attention, recall improving but family is triggering and she has behaviors when family comes in during therapies.   5/15: CT head showing improved edema, small SDH, and unchanged 8 mm midline shift  5/21: poor L attention but getting some functional grasp in LUE; L lateral lean complicating EOB balance; CGA-Min A transfer and Min-Mod transfers mostly  c/b poor safety awareness. Walking up to 180 ft with handheld assist, can advance L leg but gets buckling in L knee  despite anterior support AFO.   5/23: Less impulsive over past week, will discuss with therapies and nursing today removing  TeleSitter; Dced telesitter.   5/24: was somewhat impulsive overnight, needed reminders to stay in bed. Continue bed alarm and posey belt in WC.  - no further attempt OOB per weekend notes  5/28: Upgrading goals to CGA for OT; needs cueing with PT, quad strength improved buyt some poor awareness. Using small base quad cane now. Min A for steps. CGA goals. SLP trialling D3, still impulsive but doing better.   2 Antithrombotics: -DVT/anticoagulation:  Pharmaceutical: Lovenox initiated 07/04/2022             -antiplatelet therapy: N/A  3. Pain Management/neuropathic pain: Oxycodone as needed  5/8: added lidocaine patch x2 to L neck, shoulder  5/9: Add gabapentin 300 mg TID for LUE and LLE pain, likely related to spasticity and neuropathic.   5/10: Mild lethargy today but no use of PRN pain medications; decrease gabapentin to 100 mg TID  5/11: kpad and left shoulder sling ordered  5/23: Using oxycodone Q4H; continue  5/14: Using tylenol and oxycodone together consistently; schedule tylenol 1000 mg TID and oxycodone 5 mg TID; add oxycodone 5 mg BID Prn for breakthrough  -- Increase gabapentin to 200/200/400 mg daily  5/16: Increase gabapentin to 200/200/600 mg for pain and sleep  5/17 try voltaren gel L shoulder  5/18-5/19- denies pain this AM  5/20: Due to no improvement with prior measures, discontinue daytime gabapentin, standing Tylenol, and standing oxycodone.  Changed headache regimen to Fioricet 1 tab 4 times daily as needed, and oxycodone 5 mg 4 times daily as needed for pain.  5/21 d/t daytime lethargy + poor pain control, change gabapentin 600 mg QHS to 200 mg TID. DC fioricet, resume scheduled tylenol 1000 mg TID. Continue PRN oxy  5/22: increase QHS  gabapentin to 400 mg given increased sensitivity/pain after cutting back from 600 qhs. Add baclofen 5 mg TID PRN for spasms -improved pain control, continue this regimen, encouraged use of as needed baclofen for abdominal pain  5/27: Using Oxy mostly QHS; increase gabapentin to 600 mg QHS, decrease oxy to 5 mg BID PRN; schedule baclofen 5 mg QHS. Add Lidocaine patch over RLQ.  -With improvement  5-30: Diffuse, generalized pain, only endorses when asked.  Suspect muscular, increase baclofen to 5 mg twice daily and DC as needed oxycodone.  Change Tylenol from scheduled to 650 every 6 hours as needed.   4. Mood/Behavior/Sleep: PRN twice daily as needed provide emotional support             -antipsychotic agents: N/A  - Sleep log added - poor sleep 3 hours last night. Add melatonin 5 mg QHS + PRN trazodone 50-75 mg.   5/10: Slept 7 hours overnight, still endorsing poor sleep.  Did not use as needed trazodone, will add 50 mg standing nightly.  5/11: continue trazodone  5/13: Per log slept 5 hours overnight, however was not updated 5-10: Will recheck tomorrow  5.14: slept 7 hours overnight; see above for increased QHS gabapentin  5/15: s sleep log not completed overnight, informed nursing.  Patient endorses ongoing poor sleep, no improvement with increase gabapentin.  5/16: interrupted sleep 7+ hours; increase trazodone to 75 mg and increase gabapentin as above  5/18-20 sleeping better  5/21: Worsening lethargy per therapies over last 2 days; possible etiologies include medication induced, poor pain control, and metabolic/hyponatremia. DC fioricet, decrease nighttime gabapentin, stat CBC and BMP ordered. Sleep log remains interrupted but appropriate.   5/22: CBC, BMP WNL. Re-time sleep medications to 2000. Increase gabapentin QHS as  above and Trazadone to 100 mg QHS.   5/28 - doing better with sleep, monitor  5. Neuropsych/cognition: This patient is not capable of making decisions on her own behalf -not  consistently oriented, impulsive with poor awareness..   - Obs for safety  5/19- con't Telesitter and NT for meals; Tele Dced 5/24  6. Skin/Wound Care: Routine skin checks  - 5/8: 3 weeks post-R craniectomy; ordered staple removal from scalp and abdomen. no apparent wounds will DC rectal tube    7. Dysphagia: placed order for Cortrak removal 5/12 Diet Orders (From admission, onward)     Start     Ordered   07/14/22 1000  Diet regular Room service appropriate? Yes; Fluid consistency: Thin  Diet effective 1000       Comments: Full Supervision Slow Rate Small bites/sips Limit distractions  Question Answer Comment  Room service appropriate? Yes   Fluid consistency: Thin      07/14/22 0610          5/13: Monitor POs with cortrak removal 5/18-22 eating well  5/28: Per SLP, trialing upgraded diet today; trialing regular textures 5-29   8.  Hyponatremia.  Continue sodium chloride tablets.  Florinef 0.3 mg 3 times daily  - 5/8: Na 134 this AM. Downtrending over last 2 days. No obvious medication contributors; Already on salt tabs 2g TID. Placed 1500 ml fluid restriction, repeat today 138. Ordered daily BMP x3 days for monitoring. Reduce to Florinef 0.1 mg TID and monitor.   5/9: Na stable 13. Diastolic BP improved, remains high systolic. Orthostatic vitals negative with BP. Given florinef indication is hyponatremia not orthostasis will continue 0.1 mg TID through Friday then DC if no significant drop in Na.   5-10: Na down to 135 with mildly increased lethargy.  Increase Florinef to 0.2 mg 3 times daily, continue daily BMP for 3 days.  5/12: Na improved to 136, decrease Florinef to 0.1mg  TID given hypertension  5/13: NA 136 with decreased Florinef; reduced dosing to twice daily and recheck BMP Wednesday  5/14: DC florinef d/t persistent hypertension; labs pending  5/16: Na stable; will check today, then reduce Na starting tomorrow if appropriate with repeat labs Sunday  5/18- will  recheck labs in AM/Sunday- ordered labs- will also reduce Na levels to 2G BID with meals from TID  5/19- Na 138, but reduction hasn't shown up yet, since got lunch NA yesterday- will recheck this week 5/20: NA low 134 with reduction, resume 2 g 3 times daily dosing and recheck on Wednesday 5/21: repeat BMP today stat d/t lethergy - NA 137 - stable - refused recheck 5/22 and 5/24 AM; giving option of today or tomorrow AM, but will need to check Na to continue titrating salt tabs 5/27: agreeable to labs today, re-ordered - stable, decrease salt tabs to 2 tabs BID; recheck Thursday-pending     Latest Ref Rng & Units 07/11/2022   11:21 AM 07/05/2022    3:06 PM 07/04/2022    7:34 AM  BMP  Glucose 70 - 99 mg/dL 161  90  86   BUN 6 - 20 mg/dL 7  12  10    Creatinine 0.44 - 1.00 mg/dL 0.96  0.45  4.09   Sodium 135 - 145 mmol/L 137  137  134   Potassium 3.5 - 5.1 mmol/L 4.0  4.2  4.0   Chloride 98 - 111 mmol/L 104  102  99   CO2 22 - 32 mmol/L 23  23  24  Calcium 8.9 - 10.3 mg/dL 9.8  40.9  81.1     10.  Decreased nutritional storage.  Currently on a dysphagia #3 diet.  NGT d/ced   - 5/14: DC SSI, CBG  11.  Urinary retention.  Urecholine 5 mg 3 times daily.  Check PVR - resolved  5/8: No PVRs overnight; ordered. Mixed continence overnight.   5/9: Continue UO and low PVRs. DC urecholine.   5/10: PVRs remain low, continent.  Continue for 24 hours, then DC PVRs.  5/19- PVRs off- resolved  12.  History of tobacco/alcohol use.  Counseling  13. Diarrhea. Remove rectal tube 5/8. Increase banatrol to TID.     - 5/9: No recorded Bms since rectal tube removal; reduce banatrol back to BID, obtain KUB  - 5/10: DC banatrol. KUB with mild stool burden.  If no BM today, will need to start laxatives.   5/11: magnesium citrate ordered  5/12: flowsheet reviewd and had type 5 stools today.   5/13: soft stools, last BM 5/12, montior  5/15: Liquid Bms 5/13; now on PO diet, monitor  5/16: add colace 100 mg  BID + miralax daily PRN  5/17 schedule miralax  5/18- LBM x2   5/22: change colace to sennakot s 1 tab BID  5-23: Large BM this a.m.; monitor 5/27 LBM , large  14. HTN: now controlled     07/14/2022    5:09 AM 07/13/2022    7:59 PM 07/13/2022    1:33 PM  Vitals with BMI  Systolic 115 96 106  Diastolic 71 60 57  Pulse 90 85 90    - 5/13: PRN hydralazine used 1x overnight; BP remains significantly elevated with decreased Florinef.  Add amlodipine 5 mg daily.  - 5/14: diastolic remains elevated; add lisinopril 5 mg daily. Will take 2-3 days for amlodipine effect. DC florinef - 5/15: Much improved;  DC lisinopril; Dc amlodipine this PM if remains normotensive 5/16: Diastolic remains elevated increase norvasc to 10 mg daily 5/17 controlled overall, continue current  5/18- 5/20- BP controlled- con't regimen  5/24: Mild intermittently tachycardia; encourage PO fluids  5/27: improved HR; monitor-stable  15. L eye pain/pressure: Getting MRI brain and orbits w/o contrast d/t worsening symptoms and poor EOMI tolerance; should have been improving with decreased postop edema   - 5/16: MRI converted to CT d/t clips likely to obscure relevant areas. Found "Minimal hazy asymmetric stranding within the right intraorbitalfat, suspected to reflect a degree of passive venous congestion due to the evolving process within the right cerebral hemisphere." No focal s/s cellulitis, but will get CBC today for eval. No management changes, reassured patient and family.   5-20: Ongoing, Fioricet as above.  Labs appear appropriate, no concern for infection.  5/21: Adjusting pain regimen as above.  5/22-24: EOMI tolerance improving. Monitor.  5/27: EOMI WNL, no further complaints; resolved  16. Itching, generalized  -5/17 No rash noted, may be dry skin related, start moisturizer, continue benadryl crem    - 5/24: topical benadryl to incision sites daily   17.  Posterior shoulder pain left side, discussed with  patient this is common post stroke especially with the degree of weakness that she is experiencing.  - Continue PT OT efforts  - continue Voltaren gel 4 times daily  - patient needs K-pad to be positioned over her shoulder.  - Taping , e-stim -Pain medications as above  LOS: 23 days A FACE TO FACE EVALUATION WAS PERFORMED  Angelina Sheriff 07/14/2022, 2:04  PM

## 2022-07-14 NOTE — Progress Notes (Signed)
Physical Therapy Session Note  Patient Details  Name: Kristin Simmons MRN: [id number] Date of Birth: 1985-10-09  Today's Date: 07/14/2022 PT Individual Time: 1300-1400 PT Individual Time Calculation (min): 60 min   Short Term Goals: Week 4:  PT Short Term Goal 1 (Week 4): STG=LTGs secondary to ELOS  Skilled Therapeutic Interventions/Progress Updates:  Patient greeted sitting upright in wheelchair in room and agreeable to PT treatment session. Patient wheeled dependently to main rehab gym for time management and energy conservation. Zollie Scale, from [place], present for AFO consult- Patient gait trained x110' with [facility] and CGA in order for Olivia to perform her evaluation. Recommendation is anterior AFO with medial post in order to reduce Lt foot inversion with potential toe cap and lighter, if possible.   [name] significant other, [name], present for continued hands-on family training in order to ensure safe discharge home. Patient gait trained 200' with [facility] and hands-on assistance from [place] with appropriate VC noted. Patient propelled manual wheelchair via hemi-technique of RUE/RLE while her daughter sat in her lap in order to promote independence around the home and the community while still being able to spend quality time with her daughter. Patient ascended/descended x10 steps in stairwell with SBQC while ascending and HR while descending in order to simulate home environment- [name] provided CGA/MinA throughout with appropriate VC for safety. Patient toileted with [name] providing hands-on assistance for safety throughout- Patient able to perform pericare, however requires assistance with managing pants and for all standing mobility secondary to poor safety awareness and stability. Patient gait trained 2 x 170' without the use of an AD and CGA/MinA from therapist- Patient demonstrated good cadence with therapist only providing minor facilitation and tC for R lateral lean in order to improve  L foot clearance. Patient left sitting upright in wheelchair in her room with [name] present and transitioned to OT family education.    Therapy Documentation Precautions:  Precautions Precautions: Fall Precaution Comments: R craniectomy, no bone flap, bone flap placed in abdomen, R hand mitt, coretrak, flexiseal Restrictions Weight Bearing Restrictions: No  Pain: No/Denies pain.    Therapy/Group: Individual Therapy     07/14/2022, 8:28 AM

## 2022-07-14 NOTE — Progress Notes (Signed)
Occupational Therapy Weekly Progress Note  Patient Details  Name: Kristin Simmons MRN: [id number] Date of Birth: August 21, 1985  Beginning of progress report period: Jun 22, 2023 End of progress report period: Jul 14, 2022  Today's Date: 07/14/2022 Session 1 OT Individual Time: 4098-1191 OT Individual Time Calculation (min): 45 min   Session 2 OT Individual Time: 1400-1450 OT Individual Time Calculation (min): 50 min    Patient has met 3 of 3 short term goals.  Patient is making steady progress towards OT goals. She is currently at a min A level for all BADL tasks and functional trasnfers. She still has occasional LOB's requiring max A to correct if she has not set herself up correctly. L UE has continued to improve with some grasp/release, shoulder and scapular activation, however, pt is still not using L UE functionally. Continue current POC.  Patient continues to demonstrate the following deficits: muscle weakness, decreased cardiorespiratoy endurance, impaired timing and sequencing, abnormal tone, unbalanced muscle activation, motor apraxia, ataxia, decreased coordination, and decreased motor planning, decreased attention to left and decreased motor planning, decreased initiation, decreased attention, decreased awareness, decreased problem solving, decreased safety awareness, decreased memory, and delayed processing, and decreased sitting balance, decreased standing balance, decreased postural control, hemiplegia, and decreased balance strategies and therefore will continue to benefit from skilled OT intervention to enhance overall performance with BADL, iADL, Reduce care partner burden, and functional use of L UE .  Patient progressing toward long term goals..  Continue plan of care.  OT Short Term Goals Week 3:  OT Short Term Goal 1 (Week 3): Patient will complete shower transfer with min A OT Short Term Goal 1 - Progress (Week 3): Met OT Short Term Goal 2 (Week 3): Patient will complete  toilet transfer with min A OT Short Term Goal 2 - Progress (Week 3): Met OT Short Term Goal 3 (Week 3): Patient recall hemi dressing techniques with min questioning cues OT Short Term Goal 3 - Progress (Week 3): Met Week 4:  OT Short Term Goal 1 (Week 4): LTG=STG 2/2 ELOS  Skilled Therapeutic Interventions/Progress Updates:    Session 1 Pt greeted semi-reclined in bed and agreeable to OT treatment session. Worked on donning shoes and AFO at EOB with mod A from OT to don L AFO. Functional ambulation into bathroom with SBQC and min/CGA. Pt voided bladder and completd peri-care with set-up. Pt washed hands at the sink with min A for positioning of L UE. Pt ambulated to therpay gym with min A. Addressed L UE NMR with weight bearing towel pushes and cup stacking activity focused on grasp/release. Pt returned to room in wc for time management. OT placed SAEBO e-stim. SAEBO left on for 60 minutes. OT returned to remove SAEBO with skin intact and no adverse reactions.  [place] Stim One 330 pulse width 35 Hz pulse rate On 8 sec/ off 8 sec Ramp up/ down 2 sec Symmetrical Biphasic wave form  Max intensity at 500 Ohm load   Session 2 Pt's significant other [name] present for family education. Blocked practice for tub bench transfers In tub room. Extensive education on safety and body awareness. Brought pt in [place] and educated Timothy on sit<>stand positioningfor weight bearing towel pushes standing at counter. Demonstrated understanding. Functional ambulation in therapy apartment with Timothy and min cues for safety. Pt returned to room and practiced toilet transfer with Timothy and transfer back to bed with cues for wc positioning and locking breaks prior to transfer. Bed alarm  on, call bell in reach and needs met.   Therapy Documentation Precautions:  Precautions Precautions: Fall Precaution Comments: R craniectomy, no bone flap, bone flap placed in abdomen, R hand mitt, coretrak,  flexiseal Restrictions Weight Bearing Restrictions: No  Pain:  Denies pain     Therapy/Group: Individual Therapy  Mal Amabile 07/14/2022, 2:51 PM

## 2022-07-14 NOTE — Progress Notes (Signed)
Speech Language Pathology Daily Session Note  Patient Details  Name: CHANEA VANDERZANDEN MRN: [id number] Date of Birth: 1985/10/09  Today's Date: 07/14/2022 SLP Individual Time: 1128-1211 SLP Individual Time Calculation (min): 43 min  Short Term Goals: Week 4: SLP Short Term Goal 1 (Week 4): STGs=LTGs due to ELOS  Skilled Therapeutic Interventions: Skilled treatment session focused on dysphagia and cognitive goals. SLP facilitated session by providing skilled observation with trial lunch tray of regular textures with thin liquids. Patient without overt s/s of aspiration but required Min-Mod verbal cues for use of swallowing compensatory strategies, especially in regards to impulsivity. Recommend patient upgrade to regular textures but continue full supervision. Patient attended to meal in a moderately distracting environment with Min verbal cues for redirection. Patient requested to use the bathroom and was continent of bladder. Patient performed basic self-care tasks at the sink with Min verbal cues for problem solving. Patient requested to stay upright in the wheelchair and left with alarm on and all needs within reach. Continue with current plan of care.      Pain Discomfort in shoulder, patient repositioned   Therapy/Group: Individual Therapy  ,  07/14/2022, 12:15 PM

## 2022-07-15 MED ORDER — TRAZODONE HCL 50 MG PO TABS
100.0000 mg | ORAL_TABLET | Freq: Every evening | ORAL | Status: DC | PRN
  Administered 2022-07-18: 100 mg via ORAL
  Filled 2022-07-15: qty 2

## 2022-07-15 MED ORDER — ACETAMINOPHEN 325 MG PO TABS
650.0000 mg | ORAL_TABLET | Freq: Three times a day (TID) | ORAL | Status: DC
  Administered 2022-07-15 – 2022-07-20 (×14): 650 mg via ORAL
  Filled 2022-07-15 (×16): qty 2

## 2022-07-15 MED ORDER — SODIUM CHLORIDE 1 G PO TABS
1.0000 g | ORAL_TABLET | Freq: Two times a day (BID) | ORAL | Status: DC
  Administered 2022-07-15 – 2022-07-20 (×10): 1 g via ORAL
  Filled 2022-07-15 (×10): qty 1

## 2022-07-15 MED ORDER — ACETAMINOPHEN 325 MG PO TABS
650.0000 mg | ORAL_TABLET | Freq: Every day | ORAL | Status: DC | PRN
  Administered 2022-07-16 – 2022-07-17 (×3): 650 mg via ORAL
  Filled 2022-07-15 (×3): qty 2

## 2022-07-15 NOTE — Progress Notes (Signed)
Physical Therapy Session Note  Patient Details  Name: Kristin Simmons MRN: [id number] Date of Birth: 14-Dec-1985  Today's Date: 07/15/2022 PT Individual Time: 1001-1055 PT Individual Time Calculation (min): 54 min   Short Term Goals: Week 3:  PT Short Term Goal 1 (Week 3): STG=LTGs secondary to ELOS Week 4:  PT Short Term Goal 1 (Week 4): STG=LTGs secondary to ELOS  Skilled Therapeutic Interventions/Progress Updates:  Received pt supine in bed, pt agreeable to PT treatment, and reported jaw pain from yawning, otherwise did not c/o pain. Session with emphasis on functional mobility/transfers, generalized strengthening and endurance, dynamic standing balance/coordination, and gait training. Pt transferred supine<>sitting EOB with supervision and donned helmet and L AFO and shoes with max A. Transferred bed<>WC stand<>pivot to R with min HHA and transported to room in [facility] dependently. Stood with [facility] and CGA and ambulated 179ft x 2 trials with min HHA - cues for L knee extension and increased L step length. Donned LUE sling with max A, then performed LLE toe taps to 3in step 2x10 and 1x6 without RUE support and heavy min A fading to RUE support on SBQC and CGA - limited by pain in R knee. Worked on dynamic standing balance/coordination tossing horseshoes with RUE while standing on Airex x 3 trials with min A for balance. Pt enjoyed activity reminiscing on childhood experiences playing games in the backyard with her father. Pt stood with SBQC and CGA and ambulated 127ft with SBQC and min A back to room. Pt requested to return to bed and doffed sling, helmet, and shoes/AFO and transferred sit<>supine with supervision. Concluded session with pt semi-reclined in bed, needs within reach, and bed alarm on. LUE and LLE supported on pillow for improved positioning.   Therapy Documentation Precautions:  Precautions Precautions: Fall Precaution Comments: R craniectomy, no bone flap, bone flap placed in  abdomen, R hand mitt, coretrak, flexiseal Restrictions Weight Bearing Restrictions: No   Therapy/Group: Individual Therapy Marlana Salvage Zaunegger Blima Rich PT, DPT 07/15/2022, 7:24 AM

## 2022-07-15 NOTE — Progress Notes (Signed)
Occupational Therapy Session Note  Patient Details  Name: Kristin Simmons MRN: [id number] Date of Birth: February 26, 1985  Today's Date: 07/15/2022 Session 1 OT Individual Time: 4098-1191 OT Individual Time Calculation (min): 55 min   Session 2 OT Individual Time: 1302-1400 OT Individual Time Calculation (min): 58 min    Short Term Goals: Week 4:  OT Short Term Goal 1 (Week 4): LTG=STG 2/2 ELOS  Skilled Therapeutic Interventions/Progress Updates:    Session 1 Pt greeted semi-reclined in bed and agreeable to OT treatment session. Pt was not dressed and had not eated breakfast Pt came to sitting EOB with close supervision.Dressing tasks at EOB with min cues for UB dressing hemi technique and min A to get tighter shirt over her head. Pt able to thread  B LE's into pant legs with CGA for balance when standing to pull them up. Pt pivoted to wc with CGA. Pt set-up for breakfast including cutting items for pt. She was then able to consume meal with min cues for bite size. Mod A to don L AFO, then pt ambulated to bathroom with SBQC and CGA. Pt voided bladder and had successful BM. Pt able to complete peri-care using hip hike and set-up A. Pt brought to therapy gym in wc for time. She ambulated to therapy mat with [facility] and CGA. L U ENMR with cone stacking activity focus on grasp, release, and pushing cones across table. Pt able to extend L fingers enough today to actually release cup! OT placed SAEBO e-stim on wrist extensors. SAEBO left on for 60 minutes. OT returned to remove SAEBO with skin intact and no adverse reactions.  [place] Stim One 330 pulse width 35 Hz pulse rate On 8 sec/ off 8 sec Ramp up/ down 2 sec Symmetrical Biphasic wave form  Max intensity at 500 Ohm load  Pt returned to room and left seated in wc with alarm belt on, call bell in reach, and needs met.  Pain:  7/10 pain in head, headache. Rest and repositioned  Session 2 Pt greeted semi-reclined in bed and agreeable to OT  treatment session. Significant other not present for family education. Pt completed bed mobility with supervision. AFO donned at EOB with mod A. Pt then ambulated to bathroom with min A. Pt completed 3/3 toileting tasks with CGA/min A after voiding bladder. Pt's significant other then entered the room. Pt brought outside in wc and worked on safe ambulation on uneven surfaces outside with assistance from significant other Timothy. Practiced ambulating up and down ramp as well. Pt returned to room in wc for time and was left sitting up with alarm belt on, call bell in reach, and needs met.   Therapy Documentation Precautions:  Precautions Precautions: Fall Precaution Comments: R craniectomy, no bone flap, bone flap placed in abdomen, R hand mitt, coretrak, flexiseal Restrictions Weight Bearing Restrictions: No  Pain:  Denies pain  Therapy/Group: Individual Therapy  Mal Amabile 07/15/2022, 2:33 PM

## 2022-07-15 NOTE — Progress Notes (Signed)
Speech Language Pathology Daily Session Note  Patient Details  Name: Kristin Simmons MRN: [id number] Date of Birth: 1985-03-01  Today's Date: 07/15/2022 SLP Individual Time: 1430-1505 SLP Individual Time Calculation (min): 35 min  Short Term Goals: Week 4: SLP Short Term Goal 1 (Week 4): STGs=LTGs due to ELOS  Skilled Therapeutic Interventions: Skilled treatment session focused on cognitive goals and family education with the patient's significant other. SLP facilitated session by providing education regarding patient's current diet recommendations, appropriate textures, and swallowing compensatory strategies. SLP also facilitated session by initially providing Mod-Max A verbal and visual cues for complex problem solving during a medication management task of organizing a TID pill box. Only Min verbal cues were needed by end of session with SLP sequencing steps to create a routine. [name] significant other provided appropriate cueing throughout. Patient left upright in wheelchair with alarm on and all needs within reach. Continue with current plan of care.      Pain No/Denies Pain   Therapy/Group: Individual Therapy  ,  07/15/2022, 3:10 PM

## 2022-07-15 NOTE — Progress Notes (Signed)
PROGRESS NOTE   Subjective/Complaints:  No events overnight.  Patient complains of ongoing frontal headache today, does endorse pain in right eye when asked but does not have difficulty with EOMI.  Does feel it is gotten somewhat better since admission, but persistent.  Unsure what medications has been helpful.  No other complaints.   ROS:  R eye pain -mild, ongoing L sided sensory loss/pain -ongoing Itching - intermittent, ongoing Denies fevers, chills, N/V, abdominal pain, constipation, diarrhea, SOB, cough, chest pain, new weakness  Objective:   No results found. No results for input(s): "WBC", "HGB", "HCT", "PLT" in the last 72 hours.  No results for input(s): "NA", "K", "CL", "CO2", "GLUCOSE", "BUN", "CREATININE", "CALCIUM" in the last 72 hours.   Intake/Output Summary (Last 24 hours) at 07/15/2022 0953 Last data filed at 07/15/2022 0825 Gross per 24 hour  Intake 840 ml  Output --  Net 840 ml         Physical Exam: Vital Signs Blood pressure 104/74, pulse 85, temperature 98.3 F (36.8 C), temperature source Oral, resp. rate 16, height 5\' 4"  (1.626 m), weight 51.2 kg, SpO2 98 %.  General: No acute distress.  Laying in bed. Heart: Regular rate and rhythm no rubs murmurs or extra sounds Lungs: Clear to auscultation, breathing unlabored, no rales or wheezes Abdomen: Positive bowel sounds, soft nontender to palpation, nondistended Extremities: No clubbing, cyanosis, or edema Skin:  R craniectomy - cdi -no swelling.  Healing well. R abdominal surgical sites - healing,  CDI.    MSK: + TTP right greater than left forehead, without deformity, warmth, or edema.     No apparent subluxed left shoulder no pain on palpation      Strength: Exam unchanged on 5/25                RUE: 5/5 SA, 5/5 EF, 5/5 EE, 5/5 WE, 5/5 FF, 5/5 FA                 LUE:  2/5 FF, 1/5 FA, 2/5 EF, 2/5 EE -unchanged                RLE: 5/5 HF,  5/5 KE, 5/5 DF, 5/5 EHL, 5/5 PF                 LLE:  3/5 KE, +LLE AFO, 2/5 toe flexion/extension-unchanged  Neurologic exam:  Sensation: Altered sensation to light touch in left upper extremity and left lower extremity -no longer painful or hypersensitive-stable CN: 2 through 12 intact    Assessment/Plan: 1. Functional deficits which require 3+ hours per day of interdisciplinary therapy in a comprehensive inpatient rehab setting. Physiatrist is providing close team supervision and 24 hour management of active medical problems listed below. Physiatrist and rehab team continue to assess barriers to discharge/monitor patient progress toward functional and medical goals  Care Tool:  Bathing    Body parts bathed by patient: Left arm, Chest, Abdomen, Right upper leg   Body parts bathed by helper: Right arm, Front perineal area, Buttocks, Left upper leg, Right lower leg, Left lower leg, Face     Bathing assist Assist Level: Moderate Assistance - Patient 50 -  74%     Upper Body Dressing/Undressing Upper body dressing   What is the patient wearing?: Pull over shirt    Upper body assist Assist Level: Moderate Assistance - Patient 50 - 74%    Lower Body Dressing/Undressing Lower body dressing      What is the patient wearing?: Pants     Lower body assist Assist for lower body dressing: Maximal Assistance - Patient 25 - 49%     Toileting Toileting    Toileting assist Assist for toileting: Maximal Assistance - Patient 25 - 49%     Transfers Chair/bed transfer  Transfers assist  Chair/bed transfer activity did not occur: Safety/medical concerns  Chair/bed transfer assist level: Moderate Assistance - Patient 50 - 74%     Locomotion Ambulation   Ambulation assist      Assist level: Maximal Assistance - Patient 25 - 49% Assistive device: Orthosis Max distance: 50   Walk 10 feet activity   Assist     Assist level: Maximal Assistance - Patient 25 -  49% Assistive device: Orthosis, Walker-hemi   Walk 50 feet activity   Assist Walk 50 feet with 2 turns activity did not occur: Safety/medical concerns (Patient unable to ambulate >30' at this time secondary to fatigue, poor endurance/activity tolerance, impaired attention, increased pain, etc.)  Assist level: Maximal Assistance - Patient 25 - 49% Assistive device: Walker-hemi    Walk 150 feet activity   Assist Walk 150 feet activity did not occur: Safety/medical concerns         Walk 10 feet on uneven surface  activity   Assist Walk 10 feet on uneven surfaces activity did not occur: Safety/medical concerns         Wheelchair     Assist Is the patient using a wheelchair?: Yes Type of Wheelchair: Manual (per therapist, pt using TIS/Recline back wheelchair)    Wheelchair assist level: Dependent - Patient 0% Max wheelchair distance: 150'    Wheelchair 50 feet with 2 turns activity    Assist        Assist Level: Dependent - Patient 0%   Wheelchair 150 feet activity     Assist      Assist Level: Dependent - Patient 0%   Blood pressure 104/74, pulse 85, temperature 98.3 F (36.8 C), temperature source Oral, resp. rate 16, height 5\' 4"  (1.626 m), weight 51.2 kg, SpO2 98 %.  1. 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 Conchita Paris. Bone flap in abdominal pocket, plan for subacute flap replacement after IPR             -patient may  shower if cover incisions             -ELOS/Goals: supervision to min A;  6/5 goal DC date   Safety precautions due to poor impulse control - Helmet ordered for OOB 5/8; WHO and [facility] 5/9; AFO pending  Con't CIR PT, OT and SLP    - 5/14: Starting to advance L leg with some buckling, Min A goals seem appropriate. SLP attention, recall improving but family is triggering and she has behaviors when  family comes in during therapies.   5/15: CT head showing improved edema, small SDH, and unchanged 8 mm midline shift  5/21: poor L attention but getting some functional grasp in LUE; L lateral lean complicating EOB balance; CGA-Min A transfer and Min-Mod transfers mostly c/b  poor safety awareness. Walking up to 180 ft with handheld assist, can advance L leg but gets buckling in L knee  despite anterior support AFO.   5/23: Less impulsive over past week, will discuss with therapies and nursing today removing TeleSitter; Dced telesitter.   5/24: was somewhat impulsive overnight, needed reminders to stay in bed. Continue bed alarm and posey belt in WC.  - no further attempt OOB per weekend notes  5/28: Upgrading goals to CGA for OT; needs cueing with PT, quad strength improved buyt some poor awareness. Using small base quad cane now. Min A for steps. CGA goals. SLP trialling D3, still impulsive but doing better.   2 Antithrombotics: -DVT/anticoagulation:  Pharmaceutical: Lovenox initiated 07/04/2022             -antiplatelet therapy: N/A  3. Pain Management/neuropathic pain: Oxycodone as needed  5/8: added lidocaine patch x2 to L neck, shoulder  5/9: Add gabapentin 300 mg TID for LUE and LLE pain, likely related to spasticity and neuropathic.   5/10: Mild lethargy today but no use of PRN pain medications; decrease gabapentin to 100 mg TID  5/11: kpad and left shoulder sling ordered  5/23: Using oxycodone Q4H; continue  5/14: Using tylenol and oxycodone together consistently; schedule tylenol 1000 mg TID and oxycodone 5 mg TID; add oxycodone 5 mg BID Prn for breakthrough  -- Increase gabapentin to 200/200/400 mg daily  5/16: Increase gabapentin to 200/200/600 mg for pain and sleep  5/17 try voltaren gel L shoulder  5/18-5/19- denies pain this AM  5/20: Due to no improvement with prior measures, discontinue daytime gabapentin, standing Tylenol, and standing oxycodone.  Changed headache regimen to  Fioricet 1 tab 4 times daily as needed, and oxycodone 5 mg 4 times daily as needed for pain.  5/21 d/t daytime lethargy + poor pain control, change gabapentin 600 mg QHS to 200 mg TID. DC fioricet, resume scheduled tylenol 1000 mg TID. Continue PRN oxy  5/22: increase QHS gabapentin to 400 mg given increased sensitivity/pain after cutting back from 600 qhs. Add baclofen 5 mg TID PRN for spasms -improved pain control, continue this regimen, encouraged use of as needed baclofen for abdominal pain  5/27: Using Oxy mostly QHS; increase gabapentin to 600 mg QHS, decrease oxy to 5 mg BID PRN; schedule baclofen 5 mg QHS. Add Lidocaine patch over RLQ.  -With improvement  5-30: Diffuse, generalized pain, only endorses when asked.  Suspect muscular, increase baclofen to 5 mg twice daily and DC as needed oxycodone.  Change Tylenol from scheduled to 650 every 6 hours as needed. - used tylenol 1x    5-31: Increase complaints of headache since DC scheduled Tylenol; add back cysts 50 mg 3 times daily scheduled, 1 additional dose daily as needed.   4. Mood/Behavior/Sleep: PRN twice daily as needed provide emotional support             -antipsychotic agents: N/A  - Sleep log added - poor sleep 3 hours last night. Add melatonin 5 mg QHS + PRN trazodone 50-75 mg.   5/10: Slept 7 hours overnight, still endorsing poor sleep.  Did not use as needed trazodone, will add 50 mg standing nightly.  5/11: continue trazodone  5/13: Per log slept 5 hours overnight, however was not updated 5-10: Will recheck tomorrow  5.14: slept 7 hours overnight; see above for increased QHS gabapentin  5/15: s sleep log not completed overnight, informed nursing.  Patient endorses ongoing poor sleep, no improvement with  increase gabapentin.  5/16: interrupted sleep 7+ hours; increase trazodone to 75 mg and increase gabapentin as above  5/18-20 sleeping better  5/21: Worsening lethargy per therapies over last 2 days; possible etiologies include  medication induced, poor pain control, and metabolic/hyponatremia. DC fioricet, decrease nighttime gabapentin, stat CBC and BMP ordered. Sleep log remains interrupted but appropriate.   5/22: CBC, BMP WNL. Re-time sleep medications to 2000. Increase gabapentin QHS as above and Trazadone to 100 mg QHS.   5/28 - doing better with sleep, monitor  5/31: Change Trazodone to PRN  5. Neuropsych/cognition: This patient is not capable of making decisions on her own behalf -not consistently oriented, impulsive with poor awareness..   - Obs for safety  5/19- con't Telesitter and NT for meals; Tele Dced 5/24  6. Skin/Wound Care: Routine skin checks  - 5/8: 3 weeks post-R craniectomy; ordered staple removal from scalp and abdomen. no apparent wounds will DC rectal tube    7. Dysphagia: placed order for Cortrak removal 5/12 Diet Orders (From admission, onward)     Start     Ordered   07/14/22 1000  Diet regular Room service appropriate? Yes; Fluid consistency: Thin  Diet effective 1000       Comments: Full Supervision Slow Rate Small bites/sips Limit distractions  Question Answer Comment  Room service appropriate? Yes   Fluid consistency: Thin      07/14/22 0610          5/13: Monitor POs with cortrak removal 5/18-22 eating well  5/28: Per SLP, trialing upgraded diet today; trialing regular textures 5-29-upgraded   8.  Hyponatremia.  Continue sodium chloride tablets.  Florinef 0.3 mg 3 times daily  - 5/8: Na 134 this AM. Downtrending over last 2 days. No obvious medication contributors; Already on salt tabs 2g TID. Placed 1500 ml fluid restriction, repeat today 138. Ordered daily BMP x3 days for monitoring. Reduce to Florinef 0.1 mg TID and monitor.   5/9: Na stable 13. Diastolic BP improved, remains high systolic. Orthostatic vitals negative with BP. Given florinef indication is hyponatremia not orthostasis will continue 0.1 mg TID through Friday then DC if no significant drop in Na.    5-10: Na down to 135 with mildly increased lethargy.  Increase Florinef to 0.2 mg 3 times daily, continue daily BMP for 3 days.  5/12: Na improved to 136, decrease Florinef to 0.1mg  TID given hypertension  5/13: NA 136 with decreased Florinef; reduced dosing to twice daily and recheck BMP Wednesday  5/14: DC florinef d/t persistent hypertension; labs pending  5/16: Na stable; will check today, then reduce Na starting tomorrow if appropriate with repeat labs Sunday  5/18- will recheck labs in AM/Sunday- ordered labs- will also reduce Na levels to 2G BID with meals from TID  5/19- Na 138, but reduction hasn't shown up yet, since got lunch NA yesterday- will recheck this week 5/20: NA low 134 with reduction, resume 2 g 3 times daily dosing and recheck on Wednesday 5/21: repeat BMP today stat d/t lethergy - NA 137 - stable - refused recheck 5/22 and 5/24 AM; giving option of today or tomorrow AM, but will need to check Na to continue titrating salt tabs 5/27: agreeable to labs today, re-ordered - stable, decrease salt tabs to 2 tabs BID 5/31: Patient refused lab draws for multiple days, will endorse agreement and then retract on attempt.  Move labs to q. Monday, wean salt tabs to 1 tab twice daily, recheck of cognitive status  changes.    Latest Ref Rng & Units 07/11/2022   11:21 AM 07/05/2022    3:06 PM 07/04/2022    7:34 AM  BMP  Glucose 70 - 99 mg/dL 295  90  86   BUN 6 - 20 mg/dL 7  12  10    Creatinine 0.44 - 1.00 mg/dL 6.21  3.08  6.57   Sodium 135 - 145 mmol/L 137  137  134   Potassium 3.5 - 5.1 mmol/L 4.0  4.2  4.0   Chloride 98 - 111 mmol/L 104  102  99   CO2 22 - 32 mmol/L 23  23  24    Calcium 8.9 - 10.3 mg/dL 9.8  84.6  96.2     10.  Decreased nutritional storage.  Currently on a dysphagia #3 diet.  NGT d/ced   - 5/14: DC SSI, CBG  11.  Urinary retention.  Urecholine 5 mg 3 times daily.  Check PVR - resolved  5/8: No PVRs overnight; ordered. Mixed continence overnight.   5/9:  Continue UO and low PVRs. DC urecholine.   5/10: PVRs remain low, continent.  Continue for 24 hours, then DC PVRs.  5/19- PVRs off- resolved  12.  History of tobacco/alcohol use.  Counseling  13. Diarrhea. Remove rectal tube 5/8. Increase banatrol to TID.   - resolved   - 5/9: No recorded Bms since rectal tube removal; reduce banatrol back to BID, obtain KUB  - 5/10: DC banatrol. KUB with mild stool burden.  If no BM today, will need to start laxatives.   5/11: magnesium citrate ordered  5/12: flowsheet reviewd and had type 5 stools today.   5/13: soft stools, last BM 5/12, montior  5/15: Liquid Bms 5/13; now on PO diet, monitor  5/16: add colace 100 mg BID + miralax daily PRN  5/17 schedule miralax  5/18- LBM x2   5/22: change colace to sennakot s 1 tab BID 5/30 LBM    14. HTN: now controlled     07/15/2022    5:10 AM 07/14/2022    2:50 PM 07/14/2022    5:09 AM  Vitals with BMI  Systolic 104 114 952  Diastolic 74 84 71  Pulse 85 95 90    - 5/13: PRN hydralazine used 1x overnight; BP remains significantly elevated with decreased Florinef.  Add amlodipine 5 mg daily.  - 5/14: diastolic remains elevated; add lisinopril 5 mg daily. Will take 2-3 days for amlodipine effect. DC florinef - 5/15: Much improved;  DC lisinopril; Dc amlodipine this PM if remains normotensive 5/16: Diastolic remains elevated increase norvasc to 10 mg daily 5/17 controlled overall, continue current  5/18- 5/20- BP controlled- con't regimen  5/24: Mild intermittently tachycardia; encourage PO fluids  5/27: improved HR; monitor-stable  15. L eye pain/pressure: Getting MRI brain and orbits w/o contrast d/t worsening symptoms and poor EOMI tolerance; should have been improving with decreased postop edema   - 5/16: MRI converted to CT d/t clips likely to obscure relevant areas. Found "Minimal hazy asymmetric stranding within the right intraorbitalfat, suspected to reflect a degree of passive venous congestion due  to the evolving process within the right cerebral hemisphere." No focal s/s cellulitis, but will get CBC today for eval. No management changes, reassured patient and family.   5-20: Ongoing, Fioricet as above.  Labs appear appropriate, no concern for infection.  5/21: Adjusting pain regimen as above.  5/22-24: EOMI tolerance improving. Monitor.  5/27: EOMI WNL, no further complaints; resolved  16. Itching, generalized  -5/17 No rash noted, may be dry skin related, start moisturizer, continue benadryl crem    - 5/24: topical benadryl to incision sites daily   17.  Posterior shoulder pain left side, discussed with patient this is common post stroke especially with the degree of weakness that she is experiencing.  - Continue PT OT efforts  - continue Voltaren gel 4 times daily  - patient needs K-pad to be positioned over her shoulder.   - Taping , e-stim  - Pain medications as above  LOS: 24 days A FACE TO FACE EVALUATION WAS PERFORMED  Angelina Sheriff 07/15/2022, 9:53 AM

## 2022-07-16 DIAGNOSIS — E871 Hypo-osmolality and hyponatremia: Secondary | ICD-10-CM

## 2022-07-16 NOTE — Progress Notes (Signed)
PROGRESS NOTE   Subjective/Complaints:  Pt rested last night. Still with headaches. Otherwise no new problems  ROS: Patient denies fever, rash, sore throat, blurred vision, dizziness, nausea, vomiting, diarrhea, cough, shortness of breath or chest pain, joint or back/neck pain,  or mood change.   Objective:   No results found. No results for input(s): "WBC", "HGB", "HCT", "PLT" in the last 72 hours.  No results for input(s): "NA", "K", "CL", "CO2", "GLUCOSE", "BUN", "CREATININE", "CALCIUM" in the last 72 hours.   Intake/Output Summary (Last 24 hours) at 07/16/2022 1016 Last data filed at 07/16/2022 0753 Gross per 24 hour  Intake 285 ml  Output --  Net 285 ml        Physical Exam: Vital Signs Blood pressure 104/67, pulse (!) 110, temperature 98 F (36.7 C), resp. rate 18, height 5\' 4"  (1.626 m), weight 51.2 kg, SpO2 100 %.  Constitutional: No distress . Vital signs reviewed. HEENT: NCAT, EOMI, oral membranes moist Neck: supple Cardiovascular: RRR without murmur. No JVD    Respiratory/Chest: CTA Bilaterally without wheezes or rales. Normal effort    GI/Abdomen: BS +, non-tender, non-distended Ext: no clubbing, cyanosis, or edema Psych: flat  Skin:  R craniectomy - cdi -no swelling.  Healing well. R abdominal surgical sites - healing,  CDI.    MSK: + TTP right greater than left forehead, without deformity, warmth, or edema. Neuro:          Strength: Exam unchanged on 5/25                RUE: 5/5 SA, 5/5 EF, 5/5 EE, 5/5 WE, 5/5 FF, 5/5 FA                 LUE:  2/5 FF, 1/5 FA, 2/5 EF, 2/5 EE -stable appearance                RLE: 5/5 HF, 5/5 KE, 5/5 DF, 5/5 EHL, 5/5 PF                 LLE:  3/5 KE, +LLE AFO, 2/5 toe flexion/extension-stable Sensation: Altered sensation to light touch in left upper extremity and left lower extremity -no longer painful or hypersensitive-stable CN: 2 through 12  intact    Assessment/Plan: 1. Functional deficits which require 3+ hours per day of interdisciplinary therapy in a comprehensive inpatient rehab setting. Physiatrist is providing close team supervision and 24 hour management of active medical problems listed below. Physiatrist and rehab team continue to assess barriers to discharge/monitor patient progress toward functional and medical goals  Care Tool:  Bathing    Body parts bathed by patient: Left arm, Chest, Abdomen, Right upper leg, Right arm, Buttocks, Front perineal area, Left upper leg, Right lower leg, Left lower leg, Face   Body parts bathed by helper: Right arm, Front perineal area, Buttocks, Left upper leg, Right lower leg, Left lower leg, Face     Bathing assist Assist Level: Minimal Assistance - Patient > 75%     Upper Body Dressing/Undressing Upper body dressing   What is the patient wearing?: Pull over shirt    Upper body assist Assist Level: Minimal Assistance - Patient >  75%    Lower Body Dressing/Undressing Lower body dressing      What is the patient wearing?: Pants     Lower body assist Assist for lower body dressing: Minimal Assistance - Patient > 75%     Toileting Toileting    Toileting assist Assist for toileting: Minimal Assistance - Patient > 75%     Transfers Chair/bed transfer  Transfers assist  Chair/bed transfer activity did not occur: Safety/medical concerns  Chair/bed transfer assist level: Minimal Assistance - Patient > 75%     Locomotion Ambulation   Ambulation assist      Assist level: Maximal Assistance - Patient 25 - 49% Assistive device: Orthosis Max distance: 50   Walk 10 feet activity   Assist     Assist level: Maximal Assistance - Patient 25 - 49% Assistive device: Orthosis, Walker-hemi   Walk 50 feet activity   Assist Walk 50 feet with 2 turns activity did not occur: Safety/medical concerns (Patient unable to ambulate >30' at this time secondary to  fatigue, poor endurance/activity tolerance, impaired attention, increased pain, etc.)  Assist level: Maximal Assistance - Patient 25 - 49% Assistive device: Walker-hemi    Walk 150 feet activity   Assist Walk 150 feet activity did not occur: Safety/medical concerns         Walk 10 feet on uneven surface  activity   Assist Walk 10 feet on uneven surfaces activity did not occur: Safety/medical concerns         Wheelchair     Assist Is the patient using a wheelchair?: Yes Type of Wheelchair: Manual (per therapist, pt using TIS/Recline back wheelchair)    Wheelchair assist level: Dependent - Patient 0% Max wheelchair distance: 150'    Wheelchair 50 feet with 2 turns activity    Assist        Assist Level: Dependent - Patient 0%   Wheelchair 150 feet activity     Assist      Assist Level: Dependent - Patient 0%   Blood pressure 104/67, pulse (!) 110, temperature 98 F (36.7 C), resp. rate 18, height 5\' 4"  (1.626 m), weight 51.2 kg, SpO2 100 %.  1. 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 Conchita Paris. Bone flap in abdominal pocket, plan for subacute flap replacement after IPR             -patient may  shower if cover incisions             -ELOS/Goals: supervision to min A;  6/5 goal DC date   Safety precautions due to poor impulse control - Helmet ordered for OOB 5/8; [facility] and [facility] 5/9; AFO pending  -Continue CIR therapies including PT, OT, and SLP     - 5/14: Starting to advance L leg with some buckling, Min A goals seem appropriate. SLP attention, recall improving but family is triggering and she has behaviors when family comes in during therapies.   5/15: CT head showing improved edema, small SDH, and unchanged 8 mm midline shift  5/21: poor L attention but getting some functional grasp in LUE; L lateral lean complicating  EOB balance; CGA-Min A transfer and Min-Mod transfers mostly c/b poor safety awareness. Walking up to 180 ft with handheld assist, can advance L leg but gets buckling in L knee  despite anterior support AFO.   5/23: Less impulsive over past week, will discuss with  therapies and nursing today removing TeleSitter; Dced telesitter.   5/24: was somewhat impulsive overnight, needed reminders to stay in bed. Continue bed alarm and posey belt in WC.  - no further attempt OOB per weekend notes  5/28: Upgrading goals to CGA for OT; needs cueing with PT, quad strength improved buyt some poor awareness. Using small base quad cane now. Min A for steps. CGA goals. SLP trialling D3, still impulsive but doing better.   2 Antithrombotics: -DVT/anticoagulation:  Pharmaceutical: Lovenox initiated 07/04/2022             -antiplatelet therapy: N/A  3. Pain Management/neuropathic pain: Oxycodone as needed  5/8: added lidocaine patch x2 to L neck, shoulder  5/9: Add gabapentin 300 mg TID for LUE and LLE pain, likely related to spasticity and neuropathic.   5/10: Mild lethargy today but no use of PRN pain medications; decrease gabapentin to 100 mg TID  5/11: kpad and left shoulder sling ordered  5/23: Using oxycodone Q4H; continue  5/14: Using tylenol and oxycodone together consistently; schedule tylenol 1000 mg TID and oxycodone 5 mg TID; add oxycodone 5 mg BID Prn for breakthrough  -- Increase gabapentin to 200/200/400 mg daily  5/16: Increase gabapentin to 200/200/600 mg for pain and sleep  5/17 try voltaren gel L shoulder  5/18-5/19- denies pain this AM  5/20: Due to no improvement with prior measures, discontinue daytime gabapentin, standing Tylenol, and standing oxycodone.  Changed headache regimen to Fioricet 1 tab 4 times daily as needed, and oxycodone 5 mg 4 times daily as needed for pain.  5/21 d/t daytime lethargy + poor pain control, change gabapentin 600 mg QHS to 200 mg TID. DC fioricet, resume scheduled  tylenol 1000 mg TID. Continue PRN oxy  5/22: increase QHS gabapentin to 400 mg given increased sensitivity/pain after cutting back from 600 qhs. Add baclofen 5 mg TID PRN for spasms -improved pain control, continue this regimen, encouraged use of as needed baclofen for abdominal pain  5/27: Using Oxy mostly QHS; increase gabapentin to 600 mg QHS, decrease oxy to 5 mg BID PRN; schedule baclofen 5 mg QHS. Add Lidocaine patch over RLQ.  -With improvement  5-30: Diffuse, generalized pain, only endorses when asked.  Suspect muscular, increase baclofen to 5 mg twice daily and DC as needed oxycodone.  Change Tylenol from scheduled to 650 every 6 hours as needed. - used tylenol 1x    5-31: Increase complaints of headache since DC scheduled Tylenol; add back cysts 50 mg 3 times daily scheduled, 1 additional dose daily as needed.  6/1 some improvement with scheduled tylenol back on board 4. Mood/Behavior/Sleep: PRN twice daily as needed provide emotional support             -antipsychotic agents: N/A  - Sleep log added - poor sleep 3 hours last night. Add melatonin 5 mg QHS + PRN trazodone 50-75 mg.   5/10: Slept 7 hours overnight, still endorsing poor sleep.  Did not use as needed trazodone, will add 50 mg standing nightly.  5/11: continue trazodone  5/13: Per log slept 5 hours overnight, however was not updated 5-10: Will recheck tomorrow  5.14: slept 7 hours overnight; see above for increased QHS gabapentin  5/15: s sleep log not completed overnight, informed nursing.  Patient endorses ongoing poor sleep, no improvement with increase gabapentin.  5/16: interrupted sleep 7+ hours; increase trazodone to 75 mg and increase gabapentin as above  5/18-20 sleeping better  5/21: Worsening lethargy per therapies over  last 2 days; possible etiologies include medication induced, poor pain control, and metabolic/hyponatremia. DC fioricet, decrease nighttime gabapentin, stat CBC and BMP ordered. Sleep log remains  interrupted but appropriate.   5/22: CBC, BMP WNL. Re-time sleep medications to 2000. Increase gabapentin QHS as above and Trazadone to 100 mg QHS.   5/28 - doing better with sleep, monitor  5/31-6/1:  Trazodone now PRN--slept ok last  night  5. Neuropsych/cognition: This patient is not capable of making decisions on her own behalf -not consistently oriented, impulsive with poor awareness..   - Obs for safety  5/19- con't Telesitter and NT for meals; Tele Dced 5/24  6. Skin/Wound Care: Routine skin checks  - 5/8: 3 weeks post-R craniectomy; ordered staple removal from scalp and abdomen. no apparent wounds will DC rectal tube    7. Dysphagia: placed order for Cortrak removal 5/12 Diet Orders (From admission, onward)     Start     Ordered   07/14/22 1000  Diet regular Room service appropriate? Yes; Fluid consistency: Thin  Diet effective 1000       Comments: Full Supervision Slow Rate Small bites/sips Limit distractions  Question Answer Comment  Room service appropriate? Yes   Fluid consistency: Thin      07/14/22 0610          5/13: Monitor POs with cortrak removal 5/18-22 eating well  5/28: Per SLP, trialing upgraded diet today; trialing regular textures 5-29-upgraded   8.  Hyponatremia.  Continue sodium chloride tablets.  Florinef 0.3 mg 3 times daily  - 5/8: Na 134 this AM. Downtrending over last 2 days. No obvious medication contributors; Already on salt tabs 2g TID. Placed 1500 ml fluid restriction, repeat today 138. Ordered daily BMP x3 days for monitoring. Reduce to Florinef 0.1 mg TID and monitor.   5/9: Na stable 13. Diastolic BP improved, remains high systolic. Orthostatic vitals negative with BP. Given florinef indication is hyponatremia not orthostasis will continue 0.1 mg TID through Friday then DC if no significant drop in Na.   5-10: Na down to 135 with mildly increased lethargy.  Increase Florinef to 0.2 mg 3 times daily, continue daily BMP for 3 days.  5/12:  Na improved to 136, decrease Florinef to 0.1mg  TID given hypertension  5/13: NA 136 with decreased Florinef; reduced dosing to twice daily and recheck BMP Wednesday  5/14: DC florinef d/t persistent hypertension; labs pending  5/16: Na stable; will check today, then reduce Na starting tomorrow if appropriate with repeat labs Sunday  5/18- will recheck labs in AM/Sunday- ordered labs- will also reduce Na levels to 2G BID with meals from TID  5/19- Na 138, but reduction hasn't shown up yet, since got lunch NA yesterday- will recheck this week 5/20: NA low 134 with reduction, resume 2 g 3 times daily dosing and recheck on Wednesday 5/21: repeat BMP today stat d/t lethergy - NA 137 - stable - refused recheck 5/22 and 5/24 AM; giving option of today or tomorrow AM, but will need to check Na to continue titrating salt tabs 5/27: agreeable to labs today, re-ordered - stable, decrease salt tabs to 2 tabs BID 5/31: Patient refused lab draws for multiple days, will endorse agreement and then retract on attempt.  Move labs to q. Monday, wean salt tabs to 1 tab twice daily, recheck of cognitive status changes. 6/1: labs pending for Monday. Continue same sodium tabs for now    Latest Ref Rng & Units 07/11/2022   11:21 AM  07/05/2022    3:06 PM 07/04/2022    7:34 AM  BMP  Glucose 70 - 99 mg/dL 811  90  86   BUN 6 - 20 mg/dL 7  12  10    Creatinine 0.44 - 1.00 mg/dL 9.14  7.82  9.56   Sodium 135 - 145 mmol/L 137  137  134   Potassium 3.5 - 5.1 mmol/L 4.0  4.2  4.0   Chloride 98 - 111 mmol/L 104  102  99   CO2 22 - 32 mmol/L 23  23  24    Calcium 8.9 - 10.3 mg/dL 9.8  21.3  08.6     10.  Decreased nutritional storage.  Currently on a dysphagia #3 diet.  NGT d/ced   - 5/14: DC SSI, CBG  11.  Urinary retention.  Urecholine 5 mg 3 times daily.  Check PVR - resolved  5/8: No PVRs overnight; ordered. Mixed continence overnight.   5/9: Continue UO and low PVRs. DC urecholine.   5/10: PVRs remain low,  continent.  Continue for 24 hours, then DC PVRs.  5/19- PVRs off- resolved  12.  History of tobacco/alcohol use.  Counseling  13. Diarrhea. Remove rectal tube 5/8. Increase banatrol to TID.   - resolved   - 5/9: No recorded Bms since rectal tube removal; reduce banatrol back to BID, obtain KUB  - 5/10: DC banatrol. KUB with mild stool burden.  If no BM today, will need to start laxatives.   5/11: magnesium citrate ordered  5/12: flowsheet reviewd and had type 5 stools today.   5/13: soft stools, last BM 5/12, montior  5/15: Liquid Bms 5/13; now on PO diet, monitor  5/16: add colace 100 mg BID + miralax daily PRN  5/17 schedule miralax  5/18- LBM x2   5/22: change colace to sennakot s 1 tab BID 6/1 LBM    14. HTN: now controlled     07/16/2022    3:41 AM 07/16/2022    3:40 AM 07/16/2022    3:39 AM  Vitals with BMI  Systolic 104 121 578  Diastolic 67 87 80  Pulse 110 96 90    - 5/13: PRN hydralazine used 1x overnight; BP remains significantly elevated with decreased Florinef.  Add amlodipine 5 mg daily.  - 5/14: diastolic remains elevated; add lisinopril 5 mg daily. Will take 2-3 days for amlodipine effect. DC florinef - 5/15: Much improved;  DC lisinopril; Dc amlodipine this PM if remains normotensive 5/16: Diastolic remains elevated increase norvasc to 10 mg daily 5/17 controlled overall, continue current  5/18- 5/20- BP controlled- con't regimen  5/24: Mild intermittently tachycardia; encourage PO fluids  6/1: HR well controlled  15. L eye pain/pressure: Getting MRI brain and orbits w/o contrast d/t worsening symptoms and poor EOMI tolerance; should have been improving with decreased postop edema   - 5/16: MRI converted to CT d/t clips likely to obscure relevant areas. Found "Minimal hazy asymmetric stranding within the right intraorbitalfat, suspected to reflect a degree of passive venous congestion due to the evolving process within the right cerebral hemisphere." No focal s/s  cellulitis, but will get CBC today for eval. No management changes, reassured patient and family.   5-20: Ongoing, Fioricet as above.  Labs appear appropriate, no concern for infection.  5/21: Adjusting pain regimen as above.  5/22-24: EOMI tolerance improving. Monitor.  5/27: EOMI WNL, no further complaints; resolved  16. Itching, generalized  -5/17 No rash noted, may be dry skin related,  start moisturizer, continue benadryl crem    - 5/24: topical benadryl to incision sites daily   17.  Posterior shoulder pain left side, discussed with patient this is common post stroke especially with the degree of weakness that she is experiencing.  - Continue PT OT efforts  - continue Voltaren gel 4 times daily  - patient needs K-pad to be positioned over her shoulder.   - Taping , e-stim  - Pain medications as above  LOS: 25 days A FACE TO FACE EVALUATION WAS PERFORMED  Ranelle Oyster 07/16/2022, 10:16 AM

## 2022-07-16 NOTE — Progress Notes (Signed)
Speech Language Pathology Daily Session Note  Patient Details  Name: Kristin Simmons MRN: [id number] Date of Birth: Aug 15, 1985  Today's Date: 07/16/2022 SLP Individual Time: 1102-1200 SLP Individual Time Calculation (min): 58 min  Short Term Goals: Week 4: SLP Short Term Goal 1 (Week 4): STGs=LTGs due to ELOS  Skilled Therapeutic Interventions:  Pt was seen in late am to address cognitive re- training. Pt was alert and seated upright in bed upon SLP arrival. Pt expressed recent medical hx, named 2 of her consistent therapist, and rationale for ST. SLP challenged pt in problem solving task given community living, medication management, and daily living scenarios. Pt required instruction and encouragement to take tasks seriously as pt noted to answer impulsively. Pt providing appropriate solutions to problems presented verbally with 80% acc with min to mod cues. At conclusion of session, pt requested to go to the bathroom. SLP assisted pt to bathroom with pt donning helmet, shoes, and cane with SLP assist prior to standing. Pt with good awareness of limitations when ambulating and waited for SLP as needed. She was continent of bowel and bladder. Pt was returned back to bed with call button within reach and bed alarm active. SLP to continue POC.   Pain Pain Assessment Pain Scale: 0-10 Pain Score: 0-No pain  Therapy/Group: Individual Therapy  Renaee Munda 07/16/2022, 1:15 PM

## 2022-07-16 NOTE — Progress Notes (Signed)
Occupational Therapy Session Note  Patient Details  Name: Kristin Simmons MRN: [id number] Date of Birth: 11-25-1985  Today's Date: 07/16/2022 OT Individual Time: 4098-1191 OT Individual Time Calculation (min): 56 min    Short Term Goals: Week 4:  OT Short Term Goal 1 (Week 4): LTG=STG 2/2 ELOS  Skilled Therapeutic Interventions/Progress Updates:    Pt greeted sitting upright in bed finishing breakfast. Handoff to OT. Pt agreeable to shower but first reported need to go to the bathroom. Stand-pivot transfers with CGA. Pt needed CGA for balance during clothing management. Pt voided bladder and had BM. Pt able to complete peri-care with supervision using hip hike. Pt Then ambulated 5 feet to shower chair with min A and no AFO. Pt with some dorsiflexion noted. Bathing completed sit<>stand with CGA overall. Dressing tasks seated in wc with min cues for hemi dressing strategies. Worked on donning new L AFO with mod A> Pt then ambulated in hallway with small based quad cane and min A. Pt with L knee hyperextension with new brace. Tried ambulating again with old brace with much improved hyperextension. Will discuss with PT. Pt brushed teeth at the sink with supervision and use of L UE as a stabilizer. OT placed SAEBO e-stim on wrist extensors. SAEBO left on for 60 minutes. OT returned to remove SAEBO with skin intact and no adverse reactions.  [place]o Stim One 330 pulse width 35 Hz pulse rate On 8 sec/ off 8 sec Ramp up/ down 2 sec Symmetrical Biphasic wave form  Max intensity at 500 Ohm load  Pt left seated in wc with alarm belt on, call bell in reach, and needs met.   Therapy Documentation Precautions:  Precautions Precautions: Fall Precaution Comments: R craniectomy, no bone flap, bone flap placed in abdomen, R hand mitt, coretrak, flexiseal Restrictions Weight Bearing Restrictions: No Pain:  Denies pain   Therapy/Group: Individual Therapy  Mal Amabile 07/16/2022, 8:32 AM

## 2022-07-17 MED ORDER — TRAMADOL HCL 50 MG PO TABS
25.0000 mg | ORAL_TABLET | Freq: Three times a day (TID) | ORAL | Status: DC | PRN
  Administered 2022-07-17 – 2022-07-20 (×6): 25 mg via ORAL
  Filled 2022-07-17 (×6): qty 1

## 2022-07-17 NOTE — Progress Notes (Signed)
Physical Therapy Session Note  Patient Details  Name: Kristin Simmons MRN: [id number] Date of Birth: 1985-09-06  Today's Date: 07/17/2022 PT Individual Time: 1416-1530 PT Individual Time Calculation (min): 74 min   Short Term Goals: Week 3:  PT Short Term Goal 1 (Week 3): STG=LTGs secondary to ELOS  Skilled Therapeutic Interventions/Progress Updates: Pt presents supine in bed just returning from BR w/ NT, agreeable to therapy.  Pt transfers sup to sit w/ supervision.  Pt given B shoes and dons in figure-4 position, PT ties shoes.  Pt dons helmet but requires assist to thread strap through.  Pt transfers sit to stand w/ CGA and then amb to dayroom w/ CGA and SBQC, noted minimal L knee hyperextension (PT left AFO behind).  PT retrieved both AFOs from room.  PT donned personal AFO and amb x 180' w/ SBQC w/ increased hyperextension at L knee.  PT donned old AFO and amb multiple trials w/ SBQC and CGA w/ maintaining neutral knee ext/flex w/ L WB, although increased cues for weight shift to R for LLE advancement.  Pt performed standing cornhole toss w/ R foot on 1 3/4" platform, including reaching to L side.  No buckling of L knee noted but cueing for speed/safety to avoid LOB.  Pt performed cone obstacle course w/O AD and alternating toe taps to cone top.  Pt requires cueing for placement of LLE for safe WB/balance when tapping w/ R foot.  Pt returned to room and transferred sit to supine w/ supervision once removed helmet and AFO in sitting.  Bed alarm on and all needs in reach, siderails up per pt request.     Therapy Documentation Precautions:  Precautions Precautions: Fall Precaution Comments: R craniectomy, no bone flap, bone flap placed in abdomen, R hand mitt, coretrak, flexiseal Restrictions Weight Bearing Restrictions: No General:   Vital Signs: Therapy Vitals Temp: 98.9 F (37.2 C) Temp Source: Oral Pulse Rate: 79 Resp: 16 BP: 111/71 Patient Position (if appropriate):  Lying Oxygen Therapy SpO2: 100 % O2 Device: Room Air Pain:0/10       Therapy/Group: Individual Therapy  [name] 07/17/2022, 3:31 PM

## 2022-07-17 NOTE — Discharge Summary (Signed)
Physician Discharge Summary  Patient ID: Kristin Simmons MRN: [id number] DOB/AGE: [birth year] 37 y.o.  Admit date: 06/21/2022 Discharge date: 07/20/2022  Discharge Diagnoses:  Principal Problem:   SAH (subarachnoid hemorrhage) (HCC) Active Problems:   Right internal carotid artery aneurysm   Mild cognitive impairment with memory loss DVT prophylaxis Mood stabilization Hyponatremia Decreased nutritional storage Urinary retention/resolved Tobacco/alcohol use Hypertension  Discharged Condition: Stable  Significant Diagnostic Studies: CT ORBITS WO CONTRAST  Result Date: 06/29/2022 CLINICAL DATA:  Initial evaluation for pressure behind right eye, concern for retro-orbital hematoma. EXAM: CT ORBITS WITHOUT CONTRAST TECHNIQUE: Multidetector CT imaging of the orbits was performed using the standard protocol without intravenous contrast. Multiplanar CT image reconstructions were also generated. RADIATION DOSE REDUCTION: This exam was performed according to the departmental dose-optimization program which includes automated exposure control, adjustment of the mA and/or kV according to patient size and/or use of iterative reconstruction technique. COMPARISON:  None Available. FINDINGS: Orbits: Globes are symmetric in size with normal appearance in morphology. Right gaze noted. Optic nerves fairly symmetric and within normal limits. Minimal hazy asymmetric stranding noted within the right intraorbital fat, suspected to reflect a degree of venous congestion due to the evolving process within the right cerebral hemisphere (no frank retro-orbital hematoma or other collection. Extra-ocular muscles symmetric and within normal limits. Lacrimal glands normal. Superior orbital veins grossly symmetric and within normal limits. No visualized laterally about the orbital apices or cavernous sinus. Visible paranasal sinuses: Clear. Soft tissues: Soft tissue swelling about the right frontal scalp with underlying  craniectomy defect, better characterized on concomitant head CT. Remainder of the periorbital soft tissues are otherwise unremarkable. Osseous: Prior right pterional craniectomy. Remote posttraumatic defect at the left lamina papyracea. No acute osseous abnormality. No worrisome osseous lesions. 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 Scattered superimposed subarachnoid and subdural blood. Findings better characterized on concomitant head CT. IMPRESSION: 1. Minimal hazy asymmetric stranding within the right intraorbital fat, suspected to reflect a degree of passive venous congestion due to the evolving process within the right cerebral hemisphere. No frank retro-orbital hematoma or other collection. Changes of possible mild and/or early infection would be the primary differential consideration, and correlation with physical exam is recommended. 2. 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 Findings better evaluated on concomitant head CT. Electronically Signed   By: Rise Mu M.D.   On: 06/29/2022 20:19   CT HEAD WO CONTRAST ( )  Result Date: 06/29/2022 CLINICAL DATA:  Initial evaluation for pressure behind right eye. EXAM: CT HEAD WITHOUT CONTRAST TECHNIQUE: Contiguous axial images were obtained from the base of the skull through the vertex without intravenous contrast. RADIATION DOSE REDUCTION: This exam was performed according to the departmental dose-optimization program which includes automated exposure control, adjustment of the mA and/or kV according to patient size and/or use of iterative reconstruction technique. COMPARISON:  Comparison made with prior study from 06/13/2022 as well as concomitant CT of the orbits performed on the same day. FINDINGS: Brain: Postoperative changes from prior right  pterional decompressive craniectomy. Evolving right MCA distribution infarct and edema within the underlying anterior right cerebral hemisphere again seen. Swelling has decreased since previous exam, with somewhat decreased protrusion of brain parenchyma through the craniectomy defect. Previously seen hyperdense blood products within this region have largely resolved. Residual trace scattered small volume subarachnoid blood noted. Residual small volume subdural blood seen along  the falx and tentorium. This measures up to 2-3 mm in maximal thickness without significant mass effect. Persistent small superimposed subdural hygroma along the right falx. Persistent 8 mm left-to-right shift due to herniation through the craniectomy defect, similar to prior. Right lateral ventricle is somewhat increased in size as compared to previous, suspected to be due to evolving right cerebral encephalomalacia of rather than hydrocephalus. No new intracranial hemorrhage. No other new large vessel territory infarct. No visible mass lesion. 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 No visible hyperdense vessel, although evaluation limited by artifact. Skull: Prior right pterional craniectomy. Residual subgaleal collection overlying the craniectomy defect measures up to approximately 1.4 x 7.4 cm (series 3, image 20), decreased from prior. Skin staples have been removed in the interim. Sinuses/Orbits: Globes orbital soft tissues better evaluated on concomitant CT of the orbits. No retro-orbital hematoma visible. Visualized paranasal sinuses are clear. Mastoid air cells and middle ear cavities are well pneumatized. Other: None. IMPRESSION: 1. Postoperative changes from prior right pterional decompressive craniectomy with underlying evolving right MCA distribution infarct. Swelling has decreased since prior, with decreased protrusion of brain parenchyma through the  craniectomy defect. Previously seen hyperdense blood products within this region have largely resolved. 2. Residual small volume subdural blood along the falx and tentorium, measuring up to 2-3 mm without significant mass effect. 3. Persistent 8 mm left-to-right shift due to herniation through the craniectomy defect, similar to prior. Electronically Signed   By: Rise Mu M.D.   On: 06/29/2022 20:10   DG Abd 1 View  Result Date: 06/23/2022 CLINICAL DATA:  Constipation. EXAM: ABDOMEN - 1 VIEW COMPARISON:  Limited x-ray 06/08/2022 and CT 06/10/2022 FINDINGS: Feeding tube with tip overlying the distal stomach. There is gas seen in nondilated loops of large bowel with scattered stool. High density area in the right hemiabdomen consistent with the history of craniectomy bone flap. Minimal small bowel gas. Air and stool in the rectum. Overall mild stool. Tubal ligation clips along the pelvis. Presumed vascular calcifications in the pelvis as well. IMPRESSION: Nonspecific bowel gas pattern with mild stool. Enteric tube overlying the distal stomach Electronically Signed   By: Karen Kays M.D.   On: 06/23/2022 16:04   VAS [place] TRANSCRANIAL DOPPLER  Result Date: 06/21/2022  Transcranial Doppler Patient Name:  FLORASTINE MECKES  Date of Exam:   06/20/2022 Medical Rec #: [id number]         Accession #:    [id number] Date of Birth: 1985/06/27        Patient Gender: F Patient Age:   65 years Exam Location:  [facility] Procedure:      VAS [place] TRANSCRANIAL DOPPLER Referring Phys: Lisbeth Renshaw --------------------------------------------------------------------------------  Indications: Subarachnoid hemorrhage. Limitations: Patient movement Performing Technologist: Jean Rosenthal RDMS, RVT  Examination Guidelines: A complete evaluation includes B-mode imaging, spectral Doppler, color Doppler, and power Doppler as needed of all accessible portions of each vessel. Bilateral testing is considered an integral part  of a complete examination. Limited examinations for reoccurring indications may be performed as noted.  +----------+---------------+----------+-----------+------------------+ RIGHT TCD Right VM (cm/s)Depth (cm)Pulsatility     Comment       +----------+---------------+----------+-----------+------------------+ MCA            84.00                  0.75                       +----------+---------------+----------+-----------+------------------+  ACA           -34.00                  0.58                       +----------+---------------+----------+-----------+------------------+ Term ICA                                      Unable to insonate +----------+---------------+----------+-----------+------------------+ PCA P1         52.00                  0.94                       +----------+---------------+----------+-----------+------------------+ Opthalmic      41.00                  0.95                       +----------+---------------+----------+-----------+------------------+ ICA siphon     79.00                  0.58                       +----------+---------------+----------+-----------+------------------+ Vertebral     -43.00                  0.80                       +----------+---------------+----------+-----------+------------------+ Distal ICA     23.00                                             +----------+---------------+----------+-----------+------------------+  +----------+--------------+----------+-----------+-------+ LEFT TCD  Left VM (cm/s)Depth (cm)PulsatilityComment +----------+--------------+----------+-----------+-------+ MCA           104.00       5.4       0.77            +----------+--------------+----------+-----------+-------+ ACA           -37.00                 0.84            +----------+--------------+----------+-----------+-------+ Term ICA      72.00                  0.80             +----------+--------------+----------+-----------+-------+ PCA P1        26.00                  0.98            +----------+--------------+----------+-----------+-------+ Opthalmic     25.00                  0.87            +----------+--------------+----------+-----------+-------+ ICA siphon    57.00                  1.05            +----------+--------------+----------+-----------+-------+ Vertebral     -48.00  0.71            +----------+--------------+----------+-----------+-------+ Distal ICA    30.00                                  +----------+--------------+----------+-----------+-------+  +------------+-------+-------+             VM cm/sComment +------------+-------+-------+ Prox Basilar-79.00         +------------+-------+-------+ Dist Basilar-124.00        +------------+-------+-------+ +---------+---------------+----------+-----------+------------------+ RIGHT TCDRight VM (cm/s)Depth (cm)Pulsatility     Comment       +---------+---------------+----------+-----------+------------------+ Term ICA                                     Unable to insonate +---------+---------------+----------+-----------+------------------+  Summary:  Mildly elevated left middle cerebral artery mean flow velocities suggest mild vasospasm. slightly elevated right middle cerebral artery mean flow velocities of unclear significance. *See table(s) above for TCD measurements and observations.  Diagnosing physician: Delia Heady MD Electronically signed by Delia Heady MD on 06/21/2022 at 12:01:32 PM.    Final    VAS [place] TRANSCRANIAL DOPPLER  Result Date: 06/18/2022  Transcranial Doppler Patient Name:  SANDAR RODENBECK  Date of Exam:   06/17/2022 Medical Rec #: [id number]         Accession #:    [id number] Date of Birth: May 17, 1985        Patient Gender: F Patient Age:   68 years Exam Location:  [facility], [place] Procedure:      VAS [place] TRANSCRANIAL DOPPLER Referring  Phys: Lisbeth Renshaw --------------------------------------------------------------------------------  Indications: Subarachnoid hemorrhage. 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 Performing Technologist: Marilynne Halsted RDMS, RVT  Examination Guidelines: A complete evaluation includes B-mode imaging, spectral Doppler, color Doppler, and power Doppler as needed of all accessible portions of each vessel. Bilateral testing is considered an integral part of a complete examination. Limited examinations for reoccurring indications may be performed as noted.  +----------+---------------+----------+-----------+-------+ RIGHT TCD Right VM (cm/s)Depth (cm)PulsatilityComment +----------+---------------+----------+-----------+-------+ MCA             134         5.9       1.27            +----------+---------------+----------+-----------+-------+ ACA             -79                   1.16            +----------+---------------+----------+-----------+-------+ Term ICA        115                   1.22            +----------+---------------+----------+-----------+-------+ PCA P1          31                    0.99            +----------+---------------+----------+-----------+-------+ Opthalmic       68.                   1.85            +----------+---------------+----------+-----------+-------+ ICA siphon      33.  1.39            +----------+---------------+----------+-----------+-------+ Vertebral      -43.                   1.23            +----------+---------------+----------+-----------+-------+ Distal ICA      37.                   1.22            +----------+---------------+----------+-----------+-------+  +----------+--------------+----------+-----------+-------+ LEFT TCD  Left VM (cm/s)Depth (cm)PulsatilityComment +----------+--------------+----------+-----------+-------+ MCA            84.         5.2       1.29             +----------+--------------+----------+-----------+-------+ ACA            -64.                  0.96            +----------+--------------+----------+-----------+-------+ Term ICA       47.                   1.17            +----------+--------------+----------+-----------+-------+ PCA P1         40.                   1.03            +----------+--------------+----------+-----------+-------+ Opthalmic      35.                   1.83            +----------+--------------+----------+-----------+-------+ ICA siphon     62.                   1.17            +----------+--------------+----------+-----------+-------+ Vertebral      -34                                   +----------+--------------+----------+-----------+-------+ Distal ICA      37                                   +----------+--------------+----------+-----------+-------+  +------------+-------+-------+             VM cm/sComment +------------+-------+-------+ Prox Basilar  44           +------------+-------+-------+ Dist Basilar  51           +------------+-------+-------+  +----------------------+---+ Right Lindegaard Ratio3.6 +----------------------+---+ +---------------------+---+ Left Lindegaard Ratio2.2 +---------------------+---+  Summary:  Elevated right middle cerebral and terminal ICA mean flow velocities suggest mild vasospasm.Normal mean flow velocitie sin remaining identidfied vessels of anterior and posterior cerebral circulations. *See table(s) above for TCD measurements and observations.  Diagnosing physician: Delia Heady MD Electronically signed by Delia Heady MD on 06/18/2022 at 10:13:54 AM.    Final     Labs:  Basic Metabolic Panel: Recent Labs  Lab 07/11/22 1121  NA 137  K 4.0  CL 104  CO2 23  GLUCOSE 100*  BUN 7  CREATININE 0.66  CALCIUM 9.8    CBC: Recent Labs  Lab 07/11/22 1121  WBC 7.3  NEUTROABS 3.8  HGB 12.8  HCT 39.2  MCV 94.2  PLT 253     CBG: No results for input(s): "GLUCAP" in the last 168 hours.  Family history.  Paternal grandmother with Alzheimer's disease father with hypertension.  Denies any colon cancer esophageal cancer or rectal cancer  Brief HPI:   Kristin Simmons is a 37 y.o. right-handed female with history of hypertension as well as tobacco and alcohol use.  Patient with recent trip to the emergency department for some dental pain.  As part of her workup in the emergency department she underwent maxillofacial CT with contrast which incidentally discovered a right carotid aneurysm.  She was referred to neurosurgery.  Presented 05/31/2022 for elective right craniotomy for clipping of middle cerebral artery aneurysm placement of bone flap and subcutaneous abdominal pocket 05/31/2022 per Dr. Conchita Paris.  Hospital course expressed intraoperative rupture requiring mechanical ventilation with follow-up per critical care medicine.  Developed MCA territory edema with hemorrhagic transformation.  On 4/29 she experienced a change in mental status showed severe evidence of severe vasospasm.  CT angiogram showed evolving large right MCA territory infarction with herniation of tissue off of the right craniotomy more pronounced on prior CT.  New area of hypodensity in the right parietal lobe consistent with recent infarct as well as rightward midline shift.  She was transferred back to the ICU for hemodynamic augmentation and maintained on 3% NaCl.  Developed hyponatremia 123 as well as treated for haemophilus pneumonia and placed on sodium chloride tablets with latest sodium 135.  Bouts of hypokalemia with supplement added.  Echocardiogram with ejection fraction of 70 to 75% grade 1 diastolic dysfunction.  Patient was slowly extubated.  Currently on a mechanical soft diet with nasogastric tube feeds for nutritional support.  She had been cleared to begin Lovenox for DVT prophylaxis 06/19/2022.  Bouts of urinary retention placed on low-dose  Urecholine.  Blood pressure remained soft on Florinef.  Therapy evaluations completed due to patient decreased functional mobility was admitted for a comprehensive rehab program.   Hospital Course: Kristin Simmons was admitted to rehab 06/21/2022 for inpatient therapies to consist of PT, ST and OT at least three hours five days a week. Past admission physiatrist, therapy team and rehab RN have worked together to provide customized collaborative inpatient rehab.  Pertaining to patient's right MCA infarct with hemorrhagic conversion.  Patient had developed complications of right aneurysm clipping status post right craniotomy 05/31/2022 per Dr. Conchita Paris.  Bone flap and abdominal pocket.  Plan for subcu flap replacement after inpatient rehab.  She was wearing a helmet for safety.  She had been cleared for Lovenox for DVT prophylaxis.  No bleeding episodes.  Pain management use of scheduled baclofen.  She was using Voltaren gel 4 times a day as well as scheduled Neurontin and a Lidoderm patch with close monitoring of mental status.  Resting well at night with the addition of trazodone as needed.  Her diet has been advanced to regular.  Blood pressure controlled on Norvasc and monitored and would need outpatient follow-up.  Patient did have a history of tobacco alcohol use exhibiting no signs of withdrawal patient and family receiving counts regards to cessation of these products.  Early hospital course bouts of urinary retention with the use of Urecholine voiding without difficulty Urecholine discontinued.  Hyponatremia improved 137 with sodium chloride tablets.   Blood pressures were monitored on TID basis and controlled    Rehab course: During patient's stay in rehab weekly team conferences were held  to monitor patient's progress, set goals and discuss barriers to discharge. At admission, patient required total assist squat pivot transfers max assist step pivot transfers  Physical exam.  Blood pressure  128/90 pulse 90 temperature 98.4 respirations 17 oxygen saturation is 99% room air Constitutional.  No acute distress HEENT Head.  Craniotomy site clean and dry Eyes.  Pupils round and reactive to light no discharge without nystagmus Neck.  Supple nontender no JVD without thyromegaly Cardiac regular rate and rhythm without any extra sounds or murmur heard Abdomen.  Soft nontender positive bowel sounds without rebound Respiratory effort normal no respiratory distress without wheeze Neurologic.  Patient was a bit lethargic but arousable.  Follows simple commands.  She was able to provide her age and place.  He/She  has had improvement in activity tolerance, balance, postural control as well as ability to compensate for deficits. He/She has had improvement in functional use RUE/LUE  and RLE/LLE as well as improvement in awareness.  Sessions with emphasis on functional mobility transfers generalized strengthening.  Patient transferred supine to sitting edge of bed with supervision and donned helmet and left AFO and shoes with max assist.  Stood with SBQC and contact-guard and ambulates 180 feet minimal hand-held assist.  Patient can come to sit edge of bed with close supervision.  Dressing tasks at edge of bed with minimal cues for upper body dressing Hemi technique and minimal assist to get tighter shirt over her head.  Patient able to thread bilateral lower extremities into pant legs with contact-guard for balance with standing to pull them up.  Patient pivoted to wheelchair with contact-guard.  Patient able to complete [employer] using hip hike and set up assist.  Patient did require some instruction encouragement to take tasks seriously as patient noted to answer questions somewhat impulsively.  Patient provided appropriate solutions to problems presented verbally with 80% accuracy with min to mod cues.  Full family teaching completed plan discharge to home       Disposition: Discharge to  home    Diet: Regular  Special Instructions: No driving smoking or alcohol  Follow-up with neurosurgery for cranioplasty  Medications at discharge 1.  Tylenol as needed 2.  RisaQuad 1 capsule 3 times daily with meals 3.  Norvasc 10 mg p.o. daily 4.  Baclofen 5 mg p.o. twice daily 5.  Voltaren gel 2 g 4 times daily to affected area 6.  Folic acid 1 mg p.o. daily 7.  Neurontin 200 mg p.o. twice daily at 600 mg nightly 8.  Lidoderm patch 3 patches change as directed 9.  MiraLAX daily hold for loose stools 10.  Trazodone 100 mg nightly as needed sleep  30-35 minutes were spent completing discharge summary and discharge planning  Discharge Instructions     Ambulatory referral to Occupational Therapy   Complete by: As directed    Evaluate and treat   Ambulatory referral to Physical Medicine Rehab   Complete by: As directed    Moderate complexity follow-up 1 to 2 weeks [facility]   Ambulatory referral to Physical Therapy   Complete by: As directed    Evaluate and treat   Ambulatory referral to Speech Therapy   Complete by: As directed    Evaluate and treat        Follow-up Information     Angelina Sheriff, DO Follow up.   Specialty: Physical Medicine and Rehabilitation Why: Office to call for appointment Contact information: [address] [place] [address] [phone]  Lisbeth Renshaw, MD Follow up.   Specialty: Neurosurgery Why: Call for appointment Contact information: [address] [place] [address] [phone]                 Signed: Charlton Amor 07/17/2022, 9:44 AM

## 2022-07-17 NOTE — Progress Notes (Signed)
Patient c/o "bad headache" holding her head during rounds, Gave Tylenol, ineffective. Informed MD. Jovita Gamma PRN Tramadol, patient states it was effective.

## 2022-07-17 NOTE — Progress Notes (Addendum)
PROGRESS NOTE   Subjective/Complaints:  Pt up coming from bathroom, about to eat breakfast. Denies headaches but says that head feels "heavy". --**addendum--did c/o headaches later to nurse.  Slept well. Appetite good  ROS: Patient denies fever, rash, sore throat, blurred vision, dizziness, nausea, vomiting, diarrhea, cough, shortness of breath or chest pain, joint or back/neck pain,  or mood change.   Objective:   No results found. No results for input(s): "WBC", "HGB", "HCT", "PLT" in the last 72 hours.  No results for input(s): "NA", "K", "CL", "CO2", "GLUCOSE", "BUN", "CREATININE", "CALCIUM" in the last 72 hours.   Intake/Output Summary (Last 24 hours) at 07/17/2022 0942 Last data filed at 07/17/2022 0734 Gross per 24 hour  Intake 474 ml  Output --  Net 474 ml        Physical Exam: Vital Signs Blood pressure 112/73, pulse 85, temperature 98.4 F (36.9 C), temperature source Oral, resp. rate 18, height 5\' 4"  (1.626 m), weight 51.2 kg, SpO2 100 %.  Constitutional: No distress . Vital signs reviewed. HEENT: NCAT, EOMI, oral membranes moist Neck: supple Cardiovascular: RRR without murmur. No JVD    Respiratory/Chest: CTA Bilaterally without wheezes or rales. Normal effort    GI/Abdomen: BS +, non-tender, non-distended Ext: no clubbing, cyanosis, or edema Psych: flat but cooperative  Skin:  R craniectomy -well healed R abdominal surgical sites - well healed   MSK: + TTP right greater than left forehead, without deformity, warmth, or edema. Neuro:          Strength: Exam unchanged on 5/25                RUE: 5/5 SA, 5/5 EF, 5/5 EE, 5/5 WE, 5/5 FF, 5/5 FA                 LUE:  2/5 FF, 1/5 FA, 2/5 EF, 2/5 EE -stable appearance                RLE: 5/5 HF, 5/5 KE, 5/5 DF, 5/5 EHL, 5/5 PF                 LLE:  3/5 KE, +LLE AFO, 2/5 toe flexion/extension-stable Sensation: Altered sensation to light touch in left upper  extremity and left lower extremity -no longer painful or hypersensitive-stable CN: 2 through 12 intact Good sitting balance    Assessment/Plan: 1. Functional deficits which require 3+ hours per day of interdisciplinary therapy in a comprehensive inpatient rehab setting. Physiatrist is providing close team supervision and 24 hour management of active medical problems listed below. Physiatrist and rehab team continue to assess barriers to discharge/monitor patient progress toward functional and medical goals  Care Tool:  Bathing    Body parts bathed by patient: Left arm, Chest, Abdomen, Right upper leg, Right arm, Buttocks, Front perineal area, Left upper leg, Right lower leg, Left lower leg, Face   Body parts bathed by helper: Right arm, Front perineal area, Buttocks, Left upper leg, Right lower leg, Left lower leg, Face     Bathing assist Assist Level: Minimal Assistance - Patient > 75%     Upper Body Dressing/Undressing Upper body dressing   What is the  patient wearing?: Pull over shirt    Upper body assist Assist Level: Minimal Assistance - Patient > 75%    Lower Body Dressing/Undressing Lower body dressing      What is the patient wearing?: Pants     Lower body assist Assist for lower body dressing: Minimal Assistance - Patient > 75%     Toileting Toileting    Toileting assist Assist for toileting: Minimal Assistance - Patient > 75%     Transfers Chair/bed transfer  Transfers assist  Chair/bed transfer activity did not occur: Safety/medical concerns  Chair/bed transfer assist level: Minimal Assistance - Patient > 75%     Locomotion Ambulation   Ambulation assist      Assist level: Maximal Assistance - Patient 25 - 49% Assistive device: Orthosis Max distance: 50   Walk 10 feet activity   Assist     Assist level: Maximal Assistance - Patient 25 - 49% Assistive device: Orthosis, Walker-hemi   Walk 50 feet activity   Assist Walk 50 feet  with 2 turns activity did not occur: Safety/medical concerns (Patient unable to ambulate >30' at this time secondary to fatigue, poor endurance/activity tolerance, impaired attention, increased pain, etc.)  Assist level: Maximal Assistance - Patient 25 - 49% Assistive device: Walker-hemi    Walk 150 feet activity   Assist Walk 150 feet activity did not occur: Safety/medical concerns         Walk 10 feet on uneven surface  activity   Assist Walk 10 feet on uneven surfaces activity did not occur: Safety/medical concerns         Wheelchair     Assist Is the patient using a wheelchair?: Yes Type of Wheelchair: Manual (per therapist, pt using TIS/Recline back wheelchair)    Wheelchair assist level: Dependent - Patient 0% Max wheelchair distance: 150'    Wheelchair 50 feet with 2 turns activity    Assist        Assist Level: Dependent - Patient 0%   Wheelchair 150 feet activity     Assist      Assist Level: Dependent - Patient 0%   Blood pressure 112/73, pulse 85, temperature 98.4 F (36.9 C), temperature source Oral, resp. rate 18, height 5\' 4"  (1.626 m), weight 51.2 kg, SpO2 100 %.  1. 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 Conchita Paris. Bone flap in abdominal pocket, plan for subacute flap replacement after IPR             -patient may  shower if cover incisions             -ELOS/Goals: supervision to min A;  6/5 goal DC date   Safety precautions due to poor impulse control - Helmet ordered for OOB 5/8; [facility] and [facility] 5/9   -Continue CIR therapies including PT, OT, and SLP     - 5/14: Starting to advance L leg with some buckling, Min A goals seem appropriate. SLP attention, recall improving but family is triggering and she has behaviors when family comes in during therapies.   5/15: CT head showing improved edema, small  SDH, and unchanged 8 mm midline shift  5/21: poor L attention but getting some functional grasp in LUE; L lateral lean complicating EOB balance; CGA-Min A transfer and Min-Mod transfers mostly c/b poor safety awareness. Walking up to 180 ft with handheld assist, can advance L leg but gets buckling  in L knee  despite anterior support AFO.   5/23: Less impulsive over past week, will discuss with therapies and nursing today removing TeleSitter; Dced telesitter.   5/24: was somewhat impulsive overnight, needed reminders to stay in bed. Continue bed alarm and posey belt in WC.  - no further attempt OOB per weekend notes  5/28: Upgrading goals to CGA for OT; needs cueing with PT, quad strength improved buyt some poor awareness. Using small base quad cane now. Min A for steps. CGA goals. SLP trialling D3, still impulsive but doing better.   2 Antithrombotics: -DVT/anticoagulation:  Pharmaceutical: Lovenox initiated 07/04/2022             -antiplatelet therapy: N/A  3. Pain Management/neuropathic pain: Oxycodone as needed  5/8: added lidocaine patch x2 to L neck, shoulder  5/9: Add gabapentin 300 mg TID for LUE and LLE pain, likely related to spasticity and neuropathic.   5/10: Mild lethargy today but no use of PRN pain medications; decrease gabapentin to 100 mg TID  5/11: kpad and left shoulder sling ordered  5/23: Using oxycodone Q4H; continue  5/14: Using tylenol and oxycodone together consistently; schedule tylenol 1000 mg TID and oxycodone 5 mg TID; add oxycodone 5 mg BID Prn for breakthrough  -- Increase gabapentin to 200/200/400 mg daily  5/16: Increase gabapentin to 200/200/600 mg for pain and sleep  5/17 try voltaren gel L shoulder  5/18-5/19- denies pain this AM  5/20: Due to no improvement with prior measures, discontinue daytime gabapentin, standing Tylenol, and standing oxycodone.  Changed headache regimen to Fioricet 1 tab 4 times daily as needed, and oxycodone 5 mg 4 times daily as needed  for pain.  5/21 d/t daytime lethargy + poor pain control, change gabapentin 600 mg QHS to 200 mg TID. DC fioricet, resume scheduled tylenol 1000 mg TID. Continue PRN oxy  5/22: increase QHS gabapentin to 400 mg given increased sensitivity/pain after cutting back from 600 qhs. Add baclofen 5 mg TID PRN for spasms -improved pain control, continue this regimen, encouraged use of as needed baclofen for abdominal pain  5/27: Using Oxy mostly QHS; increase gabapentin to 600 mg QHS, decrease oxy to 5 mg BID PRN; schedule baclofen 5 mg QHS. Add Lidocaine patch over RLQ.  -With improvement  5-30: Diffuse, generalized pain, only endorses when asked.  Suspect muscular, increase baclofen to 5 mg twice daily and DC as needed oxycodone.  Change Tylenol from scheduled to 650 every 6 hours as needed. - used tylenol 1x    5-31: Increase complaints of headache since DC scheduled Tylenol; add back cysts 50 mg 3 times daily scheduled, 1 additional dose daily as needed.  6/2 pt denied headaches when I saw her in the morning and then complained of severe headaches with RN about an hour lateer.   -was lethargic with fioricet before, already on gabapentin   -will try low dose tramadol 25mg  q8 prn to start 4. Mood/Behavior/Sleep: PRN twice daily as needed provide emotional support             -antipsychotic agents: N/A  - Sleep log added - poor sleep 3 hours last night. Add melatonin 5 mg QHS + PRN trazodone 50-75 mg.   5/10: Slept 7 hours overnight, still endorsing poor sleep.  Did not use as needed trazodone, will add 50 mg standing nightly.  5/11: continue trazodone  5/13: Per log slept 5 hours overnight, however was not updated 5-10: Will recheck tomorrow  5.14: slept 7 hours  overnight; see above for increased QHS gabapentin  5/15: s sleep log not completed overnight, informed nursing.  Patient endorses ongoing poor sleep, no improvement with increase gabapentin.  5/16: interrupted sleep 7+ hours; increase trazodone to  75 mg and increase gabapentin as above  5/18-20 sleeping better  5/21: Worsening lethargy per therapies over last 2 days; possible etiologies include medication induced, poor pain control, and metabolic/hyponatremia. DC fioricet, decrease nighttime gabapentin, stat CBC and BMP ordered. Sleep log remains interrupted but appropriate.   5/22: CBC, BMP WNL. Re-time sleep medications to 2000. Increase gabapentin QHS as above and Trazadone to 100 mg QHS.   5/28 - doing better with sleep, monitor  5/31-6/2:  Trazodone now PRN--has slept well this weekend  5. Neuropsych/cognition: This patient is not capable of making decisions on her own behalf -not consistently oriented, impulsive with poor awareness..   - Obs for safety  5/19- con't Telesitter and NT for meals; Tele Dced 5/24  6. Skin/Wound Care: Routine skin checks  - 5/8: 3 weeks post-R craniectomy; ordered staple removal from scalp and abdomen. no apparent wounds will DC rectal tube    7. Dysphagia: placed order for Cortrak removal 5/12 Diet Orders (From admission, onward)     Start     Ordered   07/14/22 1000  Diet regular Room service appropriate? Yes; Fluid consistency: Thin  Diet effective 1000       Comments: Full Supervision Slow Rate Small bites/sips Limit distractions  Question Answer Comment  Room service appropriate? Yes   Fluid consistency: Thin      07/14/22 0610          5/13: Monitor POs with cortrak removal 5/18-22 eating well  5/28: Per SLP, trialing upgraded diet today; trialing regular textures 5-29-upgraded   8.  Hyponatremia.  Continue sodium chloride tablets.  Florinef 0.3 mg 3 times daily  - 5/8: Na 134 this AM. Downtrending over last 2 days. No obvious medication contributors; Already on salt tabs 2g TID. Placed 1500 ml fluid restriction, repeat today 138. Ordered daily BMP x3 days for monitoring. Reduce to Florinef 0.1 mg TID and monitor.   5/9: Na stable 13. Diastolic BP improved, remains high systolic.  Orthostatic vitals negative with BP. Given florinef indication is hyponatremia not orthostasis will continue 0.1 mg TID through Friday then DC if no significant drop in Na.   5-10: Na down to 135 with mildly increased lethargy.  Increase Florinef to 0.2 mg 3 times daily, continue daily BMP for 3 days.  5/12: Na improved to 136, decrease Florinef to 0.1mg  TID given hypertension  5/13: NA 136 with decreased Florinef; reduced dosing to twice daily and recheck BMP Wednesday  5/14: DC florinef d/t persistent hypertension; labs pending  5/16: Na stable; will check today, then reduce Na starting tomorrow if appropriate with repeat labs Sunday  5/18- will recheck labs in AM/Sunday- ordered labs- will also reduce Na levels to 2G BID with meals from TID  5/19- Na 138, but reduction hasn't shown up yet, since got lunch NA yesterday- will recheck this week 5/20: NA low 134 with reduction, resume 2 g 3 times daily dosing and recheck on Wednesday 5/21: repeat BMP today stat d/t lethergy - NA 137 - stable - refused recheck 5/22 and 5/24 AM; giving option of today or tomorrow AM, but will need to check Na to continue titrating salt tabs 5/27: agreeable to labs today, re-ordered - stable, decrease salt tabs to 2 tabs BID 5/31: Patient refused lab  draws for multiple days, will endorse agreement and then retract on attempt.  Move labs to q. Monday, wean salt tabs to 1 tab twice daily, recheck of cognitive status changes. 6/2: labs pending for Monday. Continue same sodium tabs for now    Latest Ref Rng & Units 07/11/2022   11:21 AM 07/05/2022    3:06 PM 07/04/2022    7:34 AM  BMP  Glucose 70 - 99 mg/dL 161  90  86   BUN 6 - 20 mg/dL 7  12  10    Creatinine 0.44 - 1.00 mg/dL 0.96  0.45  4.09   Sodium 135 - 145 mmol/L 137  137  134   Potassium 3.5 - 5.1 mmol/L 4.0  4.2  4.0   Chloride 98 - 111 mmol/L 104  102  99   CO2 22 - 32 mmol/L 23  23  24    Calcium 8.9 - 10.3 mg/dL 9.8  81.1  91.4     10.  Decreased  nutritional storage.  Currently on a dysphagia #3 diet.  NGT d/ced   - 5/14: DC SSI, CBG  11.  Urinary retention.  Urecholine 5 mg 3 times daily.  Check PVR - resolved  5/8: No PVRs overnight; ordered. Mixed continence overnight.   5/9: Continue UO and low PVRs. DC urecholine.   5/10: PVRs remain low, continent.  Continue for 24 hours, then DC PVRs.  5/19- PVRs off- resolved  12.  History of tobacco/alcohol use.  Counseling  13. Diarrhea. Remove rectal tube 5/8. Increase banatrol to TID.   - resolved   - 5/9: No recorded Bms since rectal tube removal; reduce banatrol back to BID, obtain KUB  - 5/10: DC banatrol. KUB with mild stool burden.  If no BM today, will need to start laxatives.   5/11: magnesium citrate ordered  5/12: flowsheet reviewd and had type 5 stools today.   5/13: soft stools, last BM 5/12, montior  5/15: Liquid Bms 5/13; now on PO diet, monitor  5/16: add colace 100 mg BID + miralax daily PRN  5/17 schedule miralax  5/18- LBM x2   5/22: change colace to sennakot s 1 tab BID 6/2 LBM today  14. HTN: now controlled     07/17/2022    6:03 AM 07/16/2022    8:02 PM 07/16/2022    1:39 PM  Vitals with BMI  Systolic 112 122 782  Diastolic 73 79 79  Pulse 85 97 83    - 5/13: PRN hydralazine used 1x overnight; BP remains significantly elevated with decreased Florinef.  Add amlodipine 5 mg daily.  - 5/14: diastolic remains elevated; add lisinopril 5 mg daily. Will take 2-3 days for amlodipine effect. DC florinef - 5/15: Much improved;  DC lisinopril; Dc amlodipine this PM if remains normotensive 5/16: Diastolic remains elevated increase norvasc to 10 mg daily 5/17 controlled overall, continue current  5/18- 5/20- BP controlled- con't regimen  5/24: Mild intermittently tachycardia; encourage PO fluids  6/2: HR well controlled  15. L eye pain/pressure: Getting MRI brain and orbits w/o contrast d/t worsening symptoms and poor EOMI tolerance; should have been improving with  decreased postop edema   - 5/16: MRI converted to CT d/t clips likely to obscure relevant areas. Found "Minimal hazy asymmetric stranding within the right intraorbitalfat, suspected to reflect a degree of passive venous congestion due to the evolving process within the right cerebral hemisphere." No focal s/s cellulitis, but will get CBC today for eval. No management changes,  reassured patient and family.   5-20: Ongoing, Fioricet as above.  Labs appear appropriate, no concern for infection.  5/21: Adjusting pain regimen as above.  5/22-24: EOMI tolerance improving. Monitor.  5/27: EOMI WNL, no further complaints; resolved  16. Itching, generalized  -5/17 No rash noted, may be dry skin related, start moisturizer, continue benadryl crem    - 5/24: topical benadryl to incision sites daily   17.  Posterior shoulder pain left side, discussed with patient this is common post stroke especially with the degree of weakness that she is experiencing.  - Continue PT OT efforts  - continue Voltaren gel 4 times daily  - patient needs K-pad to be positioned over her shoulder.   - Taping , e-stim  - Pain medications as above  LOS: 26 days A FACE TO FACE EVALUATION WAS PERFORMED  Ranelle Oyster 07/17/2022, 9:42 AM

## 2022-07-18 LAB — COMPREHENSIVE METABOLIC PANEL
ALT: 40 U/L (ref 0–44)
AST: 17 U/L (ref 15–41)
Albumin: 3.1 g/dL — ABNORMAL LOW (ref 3.5–5.0)
Alkaline Phosphatase: 50 U/L (ref 38–126)
Anion gap: 7 (ref 5–15)
BUN: 9 mg/dL (ref 6–20)
CO2: 22 mmol/L (ref 22–32)
Calcium: 9.3 mg/dL (ref 8.9–10.3)
Chloride: 105 mmol/L (ref 98–111)
Creatinine, Ser: 0.66 mg/dL (ref 0.44–1.00)
GFR, Estimated: >60 mL/min (ref >60)
Glucose, Bld: 87 mg/dL (ref 70–99)
Potassium: 3.6 mmol/L (ref 3.5–5.1)
Sodium: 134 mmol/L — ABNORMAL LOW (ref 135–145)
Total Bilirubin: 0.3 mg/dL (ref 0.3–1.2)
Total Protein: 6.4 g/dL — ABNORMAL LOW (ref 6.5–8.1)

## 2022-07-18 LAB — CBC
HCT: 34.1 % — ABNORMAL LOW (ref 36.0–46.0)
Hemoglobin: 11.2 g/dL — ABNORMAL LOW (ref 12.0–15.0)
MCH: 30.5 pg (ref 26.0–34.0)
MCHC: 32.8 g/dL (ref 30.0–36.0)
MCV: 92.9 fL (ref 80.0–100.0)
Platelets: 266 10*3/uL (ref 150–400)
RBC: 3.67 MIL/uL — ABNORMAL LOW (ref 3.87–5.11)
RDW: 14 % (ref 11.5–15.5)
WBC: 6.5 10*3/uL (ref 4.0–10.5)
nRBC: 0 % (ref 0.0–0.2)

## 2022-07-18 NOTE — Progress Notes (Signed)
Occupational Therapy Session Note  Patient Details  Name: Kristin Simmons MRN: [id number] Date of Birth: 1985/03/23  Today's Date: 07/18/2022 OT Individual Time: 1345-1430 OT Individual Time Calculation (min): 45 min    Short Term Goals: Week 4:  OT Short Term Goal 1 (Week 4): LTG=STG 2/2 ELOS  Skilled Therapeutic Interventions/Progress Updates:   OT session focused on L sided NMRE, joint protection and pain mngt (prevention), L sided awareness using URIAS air splint for proximal strength training and deep pressure/proprioceptive and weight bearing inputs. Pt then educated on rationale and then able to perform Urias Stroke Recovery Air splint neuro re-ed in hospital bed for gravity assisted performance. Once dooned, pt instructed in shoulder shrugs x 10 reps x 3 sets. Encouraged these on own between sessions and wrote visual written cue on board for recall. Pt assisted to perform 10 reps scap protraction, sh flex/ext, sh horizontal abd/add and some place and hold with trace prox AAROM.  Once air splint removed, pt demonstrated improved spontaneous grasp release with report of increased fluidity. Pt moved to EOB with S and then stood with S with WB on tray table through elbow, wrist, hand. OT facilitating weight shifts with cues. OT trained pt to perform 5 trials of cup to mouth with max prox and mod grasp facilitation using L hand for grasp and moving cup to mouth to continue to improve neuro awareness in general. Pt reports she has been trying to integrate more especially with self feeding "I tried to hold my burger at lunch" pt reported. Pt returned back to supine as per request and was set up with needs, bed alarm and call bell at end of session.   Pain: reports intermittent L sh pain, denied pain this session with emphasis on proximal L sh/scap stability and strength   Therapy Documentation Precautions:  Precautions Precautions: Fall Precaution Comments: R craniectomy, no bone flap, bone  flap placed in abdomen, R hand mitt, coretrak, flexiseal Restrictions Weight Bearing Restrictions: No    Therapy/Group: Individual Therapy  Vicenta Dunning 07/18/2022, 3:04 PM

## 2022-07-18 NOTE — Progress Notes (Signed)
PROGRESS NOTE   Subjective/Complaints:  No acute complaints. No events overnight. Endorses ongoing frontal HA, improved with Tramadol. No other concerns.   ROS: Patient denies fever, rash, sore throat, blurred vision, dizziness, nausea, vomiting, diarrhea, cough, shortness of breath or chest pain, joint or back/neck pain,  or mood change.   Objective:   No results found. No results for input(s): "WBC", "HGB", "HCT", "PLT" in the last 72 hours.  No results for input(s): "NA", "K", "CL", "CO2", "GLUCOSE", "BUN", "CREATININE", "CALCIUM" in the last 72 hours.   Intake/Output Summary (Last 24 hours) at 07/18/2022 0825 Last data filed at 07/18/2022 0734 Gross per 24 hour  Intake 238 ml  Output --  Net 238 ml         Physical Exam: Vital Signs Blood pressure 115/79, pulse 80, temperature 98.4 F (36.9 C), resp. rate 15, height 5\' 4"  (1.626 m), weight 51.2 kg, SpO2 98 %.  Constitutional: No distress . Vital signs reviewed. Laying in bed HEENT: NCAT, EOMI, oral membranes moist Neck: supple Cardiovascular: RRR without murmur. No JVD    Respiratory/Chest: CTA Bilaterally without wheezes or rales. Normal effort    GI/Abdomen: BS +, non-tender, non-distended Ext: no clubbing, cyanosis, or edema Psych: flat but cooperative  Skin:  R craniectomy -well healed R abdominal surgical sites - well healed   MSK: + TTP right greater than left forehead  - unchanged Neuro:          Strength: Exam unchanged on 5/25                RUE: 5/5 SA, 5/5 EF, 5/5 EE, 5/5 WE, 5/5 FF, 5/5 FA                 LUE:  3/5 FF, 2/5 FA, 3/5 EF, 3/5 EE -improved                RLE: 5/5 HF, 5/5 KE, 5/5 DF, 5/5 EHL, 5/5 PF                 LLE:  4/5 KE, 4/5 ankle DF/PF - imprived Sensation: Altered sensation to light touch in left upper extremity and left lower extremity -no longer painful or hypersensitive-stable CN: 2 through 12 intact Good sitting  balance    Assessment/Plan: 1. Functional deficits which require 3+ hours per day of interdisciplinary therapy in a comprehensive inpatient rehab setting. Physiatrist is providing close team supervision and 24 hour management of active medical problems listed below. Physiatrist and rehab team continue to assess barriers to discharge/monitor patient progress toward functional and medical goals  Care Tool:  Bathing    Body parts bathed by patient: Left arm, Chest, Abdomen, Right upper leg, Right arm, Buttocks, Front perineal area, Left upper leg, Right lower leg, Left lower leg, Face   Body parts bathed by helper: Right arm, Front perineal area, Buttocks, Left upper leg, Right lower leg, Left lower leg, Face     Bathing assist Assist Level: Minimal Assistance - Patient > 75%     Upper Body Dressing/Undressing Upper body dressing   What is the patient wearing?: Pull over shirt    Upper body assist Assist Level:  Minimal Assistance - Patient > 75%    Lower Body Dressing/Undressing Lower body dressing      What is the patient wearing?: Pants     Lower body assist Assist for lower body dressing: Minimal Assistance - Patient > 75%     Toileting Toileting    Toileting assist Assist for toileting: Minimal Assistance - Patient > 75%     Transfers Chair/bed transfer  Transfers assist  Chair/bed transfer activity did not occur: Safety/medical concerns  Chair/bed transfer assist level: Minimal Assistance - Patient > 75%     Locomotion Ambulation   Ambulation assist      Assist level: Contact Guard/Touching assist Assistive device: Cane-quad Max distance: 180   Walk 10 feet activity   Assist     Assist level: Contact Guard/Touching assist Assistive device: Orthosis, Cane-quad   Walk 50 feet activity   Assist Walk 50 feet with 2 turns activity did not occur: Safety/medical concerns (Patient unable to ambulate >30' at this time secondary to fatigue, poor  endurance/activity tolerance, impaired attention, increased pain, etc.)  Assist level: Contact Guard/Touching assist Assistive device: Cane-quad, Orthosis    Walk 150 feet activity   Assist Walk 150 feet activity did not occur: Safety/medical concerns  Assist level: Contact Guard/Touching assist Assistive device: Cane-quad, Orthosis    Walk 10 feet on uneven surface  activity   Assist Walk 10 feet on uneven surfaces activity did not occur: Safety/medical concerns         Wheelchair     Assist Is the patient using a wheelchair?: Yes Type of Wheelchair: Manual (per therapist, pt using TIS/Recline back wheelchair)    Wheelchair assist level: Dependent - Patient 0% Max wheelchair distance: 150'    Wheelchair 50 feet with 2 turns activity    Assist        Assist Level: Dependent - Patient 0%   Wheelchair 150 feet activity     Assist      Assist Level: Dependent - Patient 0%   Blood pressure 115/79, pulse 80, temperature 98.4 F (36.9 C), resp. rate 15, height 5\' 4"  (1.626 m), weight 51.2 kg, SpO2 98 %.  1. 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 Conchita Paris. Bone flap in abdominal pocket, plan for subacute flap replacement after IPR             -patient may  shower if cover incisions             -ELOS/Goals: supervision to min A;  6/5 goal DC date   Safety precautions due to poor impulse control - Helmet ordered for OOB 5/8; [facility] and [facility] 5/9   -Continue CIR therapies including PT, OT, and SLP     - 5/14: Starting to advance L leg with some buckling, Min A goals seem appropriate. SLP attention, recall improving but family is triggering and she has behaviors when family comes in during therapies.   5/15: CT head showing improved edema, small SDH, and unchanged 8 mm midline shift  5/21: poor L attention but getting some  functional grasp in LUE; L lateral lean complicating EOB balance; CGA-Min A transfer and Min-Mod transfers mostly c/b poor safety awareness. Walking up to 180 ft with handheld assist, can advance L leg but gets buckling in L knee  despite anterior support AFO.   5/23: Less impulsive over past week, will discuss with therapies and nursing  today removing TeleSitter; Dced telesitter.   5/24: was somewhat impulsive overnight, needed reminders to stay in bed. Continue bed alarm and posey belt in WC.  - no further attempt OOB per weekend notes  5/28: Upgrading goals to CGA for OT; needs cueing with PT, quad strength improved buyt some poor awareness. Using small base quad cane now. Min A for steps. CGA goals. SLP trialling D3, still impulsive but doing better.   2 Antithrombotics: -DVT/anticoagulation:  Pharmaceutical: Lovenox initiated 07/04/2022             -antiplatelet therapy: N/A  3. Pain Management/neuropathic pain: Oxycodone as needed  5/8: added lidocaine patch x2 to L neck, shoulder  5/9: Add gabapentin 300 mg TID for LUE and LLE pain, likely related to spasticity and neuropathic.   5/10: Mild lethargy today but no use of PRN pain medications; decrease gabapentin to 100 mg TID  5/11: kpad and left shoulder sling ordered  5/23: Using oxycodone Q4H; continue  5/14: Using tylenol and oxycodone together consistently; schedule tylenol 1000 mg TID and oxycodone 5 mg TID; add oxycodone 5 mg BID Prn for breakthrough  -- Increase gabapentin to 200/200/400 mg daily  5/16: Increase gabapentin to 200/200/600 mg for pain and sleep  5/17 try voltaren gel L shoulder  5/18-5/19- denies pain this AM  5/20: Due to no improvement with prior measures, discontinue daytime gabapentin, standing Tylenol, and standing oxycodone.  Changed headache regimen to Fioricet 1 tab 4 times daily as needed, and oxycodone 5 mg 4 times daily as needed for pain.  5/21 d/t daytime lethargy + poor pain control, change gabapentin 600  mg QHS to 200 mg TID. DC fioricet, resume scheduled tylenol 1000 mg TID. Continue PRN oxy  5/22: increase QHS gabapentin to 400 mg given increased sensitivity/pain after cutting back from 600 qhs. Add baclofen 5 mg TID PRN for spasms -improved pain control, continue this regimen, encouraged use of as needed baclofen for abdominal pain  5/27: Using Oxy mostly QHS; increase gabapentin to 600 mg QHS, decrease oxy to 5 mg BID PRN; schedule baclofen 5 mg QHS. Add Lidocaine patch over RLQ.  -With improvement  5-30: Diffuse, generalized pain, only endorses when asked.  Suspect muscular, increase baclofen to 5 mg twice daily and DC as needed oxycodone.  Change Tylenol from scheduled to 650 every 6 hours as needed. - used tylenol 1x    5-31: Increase complaints of headache since DC scheduled Tylenol; add back cysts 50 mg 3 times daily scheduled, 1 additional dose daily as needed.  6/2 pt denied headaches when I saw her in the morning and then complained of severe headaches with RN about an hour lateer.   -was lethargic with fioricet before, already on gabapentin   -will try low dose tramadol 25mg  q8 prn to start - improved  4. Mood/Behavior/Sleep: PRN twice daily as needed provide emotional support             -antipsychotic agents: N/A  - Sleep log added - poor sleep 3 hours last night. Add melatonin 5 mg QHS + PRN trazodone 50-75 mg.   5/10: Slept 7 hours overnight, still endorsing poor sleep.  Did not use as needed trazodone, will add 50 mg standing nightly.  5/11: continue trazodone  5/13: Per log slept 5 hours overnight, however was not updated 5-10: Will recheck tomorrow  5.14: slept 7 hours overnight; see above for increased QHS gabapentin  5/15: s sleep log not completed overnight, informed nursing.  Patient  endorses ongoing poor sleep, no improvement with increase gabapentin.  5/16: interrupted sleep 7+ hours; increase trazodone to 75 mg and increase gabapentin as above  5/18-20 sleeping  better  5/21: Worsening lethargy per therapies over last 2 days; possible etiologies include medication induced, poor pain control, and metabolic/hyponatremia. DC fioricet, decrease nighttime gabapentin, stat CBC and BMP ordered. Sleep log remains interrupted but appropriate.   5/22: CBC, BMP WNL. Re-time sleep medications to 2000. Increase gabapentin QHS as above and Trazadone to 100 mg QHS.   5/28 - doing better with sleep, monitor  5/31-6/3:  Trazodone now PRN--sleeping well   5. Neuropsych/cognition: This patient is not capable of making decisions on her own behalf -not consistently oriented, impulsive with poor awareness..   - Obs for safety  5/19- con't Telesitter and NT for meals; Tele Dced 5/24  6. Skin/Wound Care: Routine skin checks  - 5/8: 3 weeks post-R craniectomy; ordered staple removal from scalp and abdomen. no apparent wounds will DC rectal tube    7. Dysphagia: placed order for Cortrak removal 5/12 Diet Orders (From admission, onward)     Start     Ordered   07/14/22 1000  Diet regular Room service appropriate? Yes; Fluid consistency: Thin  Diet effective 1000       Comments: Full Supervision Slow Rate Small bites/sips Limit distractions  Question Answer Comment  Room service appropriate? Yes   Fluid consistency: Thin      07/14/22 0610          5/13: Monitor POs with cortrak removal 5/18-22 eating well  5/28: Per SLP, trialing upgraded diet today; trialing regular textures 5-29-upgraded   8.  Hyponatremia.  Continue sodium chloride tablets.  Florinef 0.3 mg 3 times daily  - 5/8: Na 134 this AM. Downtrending over last 2 days. No obvious medication contributors; Already on salt tabs 2g TID. Placed 1500 ml fluid restriction, repeat today 138. Ordered daily BMP x3 days for monitoring. Reduce to Florinef 0.1 mg TID and monitor.   5/9: Na stable 13. Diastolic BP improved, remains high systolic. Orthostatic vitals negative with BP. Given florinef indication is  hyponatremia not orthostasis will continue 0.1 mg TID through Friday then DC if no significant drop in Na.   5-10: Na down to 135 with mildly increased lethargy.  Increase Florinef to 0.2 mg 3 times daily, continue daily BMP for 3 days.  5/12: Na improved to 136, decrease Florinef to 0.1mg  TID given hypertension  5/13: NA 136 with decreased Florinef; reduced dosing to twice daily and recheck BMP Wednesday  5/14: DC florinef d/t persistent hypertension; labs pending  5/16: Na stable; will check today, then reduce Na starting tomorrow if appropriate with repeat labs Sunday  5/18- will recheck labs in AM/Sunday- ordered labs- will also reduce Na levels to 2G BID with meals from TID  5/19- Na 138, but reduction hasn't shown up yet, since got lunch NA yesterday- will recheck this week 5/20: NA low 134 with reduction, resume 2 g 3 times daily dosing and recheck on Wednesday 5/21: repeat BMP today stat d/t lethergy - NA 137 - stable - refused recheck 5/22 and 5/24 AM; giving option of today or tomorrow AM, but will need to check Na to continue titrating salt tabs 5/27: agreeable to labs today, re-ordered - stable, decrease salt tabs to 2 tabs BID 5/31: Patient refused lab draws for multiple days, will endorse agreement and then retract on attempt.  Move labs to q. Monday, wean salt tabs  to 1 tab twice daily, recheck of cognitive status changes. 6/3: Na 134, will not further wean salt tabs; only intaking 400-500 ccs fluids per day; continue regimen for discharge     Latest Ref Rng & Units 07/11/2022   11:21 AM 07/05/2022    3:06 PM 07/04/2022    7:34 AM  BMP  Glucose 70 - 99 mg/dL 604  90  86   BUN 6 - 20 mg/dL 7  12  10    Creatinine 0.44 - 1.00 mg/dL 5.40  9.81  1.91   Sodium 135 - 145 mmol/L 137  137  134   Potassium 3.5 - 5.1 mmol/L 4.0  4.2  4.0   Chloride 98 - 111 mmol/L 104  102  99   CO2 22 - 32 mmol/L 23  23  24    Calcium 8.9 - 10.3 mg/dL 9.8  47.8  29.5     10.  Decreased nutritional  storage.  Currently on a dysphagia #3 diet.  NGT d/ced   - 5/14: DC SSI, CBG  11.  Urinary retention.  Urecholine 5 mg 3 times daily.  Check PVR - resolved  5/8: No PVRs overnight; ordered. Mixed continence overnight.   5/9: Continue UO and low PVRs. DC urecholine.   5/10: PVRs remain low, continent.  Continue for 24 hours, then DC PVRs.  5/19- PVRs off- resolved  12.  History of tobacco/alcohol use.  Counseling  13. Diarrhea. Remove rectal tube 5/8. Increase banatrol to TID.   - resolved   - 5/9: No recorded Bms since rectal tube removal; reduce banatrol back to BID, obtain KUB  - 5/10: DC banatrol. KUB with mild stool burden.  If no BM today, will need to start laxatives.   5/11: magnesium citrate ordered  5/12: flowsheet reviewd and had type 5 stools today.   5/13: soft stools, last BM 5/12, montior  5/15: Liquid Bms 5/13; now on PO diet, monitor  5/16: add colace 100 mg BID + miralax daily PRN  5/17 schedule miralax  5/18- LBM x2   5/22: change colace to sennakot s 1 tab BID 6/2 LBM today  14. HTN: now controlled     07/18/2022    6:22 AM 07/17/2022    7:22 PM 07/17/2022    1:09 PM  Vitals with BMI  Systolic 115 116 621  Diastolic 79 69 71  Pulse 80 79 79    - 5/13: PRN hydralazine used 1x overnight; BP remains significantly elevated with decreased Florinef.  Add amlodipine 5 mg daily.  - 5/14: diastolic remains elevated; add lisinopril 5 mg daily. Will take 2-3 days for amlodipine effect. DC florinef - 5/15: Much improved;  DC lisinopril; Dc amlodipine this PM if remains normotensive 5/16: Diastolic remains elevated increase norvasc to 10 mg daily 5/17-6/3 controlled overall, continue current   15. L eye pain/pressure: Getting MRI brain and orbits w/o contrast d/t worsening symptoms and poor EOMI tolerance; should have been improving with decreased postop edema   - 5/16: MRI converted to CT d/t clips likely to obscure relevant areas. Found "Minimal hazy asymmetric stranding  within the right intraorbitalfat, suspected to reflect a degree of passive venous congestion due to the evolving process within the right cerebral hemisphere." No focal s/s cellulitis, but will get CBC today for eval. No management changes, reassured patient and family.   5-20: Ongoing, Fioricet as above.  Labs appear appropriate, no concern for infection.  5/21: Adjusting pain regimen as above.  5/22-24: EOMI tolerance  improving. Monitor.  5/27: EOMI WNL, no further complaints;  6/3: Endorsing again, well controlled with tramadol, cont  16. Itching, generalized  -5/17 No rash noted, may be dry skin related, start moisturizer, continue benadryl crem    - 5/24: topical benadryl to incision sites daily   17.  Posterior shoulder pain left side, discussed with patient this is common post stroke especially with the degree of weakness that she is experiencing.  - Continue PT OT efforts  - continue Voltaren gel 4 times daily  - patient needs K-pad to be positioned over her shoulder.   - Taping , e-stim  - Pain medications as above  LOS: 27 days A FACE TO FACE EVALUATION WAS PERFORMED  Angelina Sheriff 07/18/2022, 8:25 AM

## 2022-07-18 NOTE — Progress Notes (Signed)
Physical Therapy Session Note  Patient Details  Name: Kristin Simmons MRN: [id number] Date of Birth: June 06, 1985  Today's Date: 07/18/2022 PT Individual Time: 0900-1015 PT Individual Time Calculation (min): 75 min   Short Term Goals: Week 3:  PT Short Term Goal 1 (Week 3): STG=LTGs secondary to ELOS  Skilled Therapeutic Interventions/Progress Updates: Pt presents supine in bed and agreeable to therapy.  Pt transfers sup to sit w/ sup to mod I.  Pt sits EOB and dons helmet independently once given, initiated sling donning.  Pt transfers sit to stand w/ SBQC and CGA mostly for quick movements.  Pt amb w/ personal AFO on w/ noted hyperextension.  Replaced w/ older AFO and amb multiple trials w/ SBQC and CGA, without hyperextension noted.  Pt cues for increased weight shift to R for LLE advancement.  Pt performed standing "tic tac toe" w/ bean bags on [employer] cushion for improved balance/ NMR LLE.  Pt amb w/o AD and light min to CGA, cues for weight shift.  Pt stepping over canes, horseshoe toss and then picking up from floor.  1 episode of L knee buckling and PT assist to maintain upright.  Pt returned to room and returned to supine w/ mod I/supervision.  Bed alarm on and all needs in reach.  Orthotist contacted for modifications to AFO and will come for next PT session today.     Therapy Documentation Precautions:  Precautions Precautions: Fall Precaution Comments: R craniectomy, no bone flap, bone flap placed in abdomen, R hand mitt, coretrak, flexiseal Restrictions Weight Bearing Restrictions: No General:   Vital Signs: Therapy Vitals Temp: 98.4 F (36.9 C) Pulse Rate: 80 Resp: 15 BP: 115/79 Patient Position (if appropriate): Lying Oxygen Therapy SpO2: 98 % O2 Device: Room Air Pain:0/10, although does eventually require re-positioning of helmet c/o pain, but w/ removal, no pain stated.        Therapy/Group: Individual Therapy  [name] 07/18/2022, 10:16 AM

## 2022-07-18 NOTE — Progress Notes (Signed)
Physical Therapy Session Note  Patient Details  Name: [name] MRN: [id number] Date of Birth: [birth year]  Today's Date: 07/18/2022 PT Individual Time: 1300-1345 PT Individual Time Calculation (min): 45 min   Short Term Goals: Week 3:  PT Short Term Goal 1 (Week 3): STG=LTGs secondary to ELOS  Skilled Therapeutic Interventions/Progress Updates:      Therapy Documentation Precautions:  Precautions Precautions: Fall Precaution Comments: R craniectomy, no bone flap, bone flap placed in abdomen, R hand mitt, coretrak, flexiseal Restrictions Weight Bearing Restrictions: No  Hannah Beat, Orthotist from [place] present during pt's PT session for re-consultation. Pt provided with personalized anterior carbon AFO but with excessive left knee hyperextension with gait despite use of heel wedge. Pt tried PLS and semi-solid AFO and presents decreased knee hyperextension but poor foot clearance. Plan for Orthotist to provide pt with PLS with anterior pad to discourage knee hyperextension and toe cap to assist with foot clearance. Pt (S) with bed mobility and CGA to light min A for 1 loss of balance with gait with SBQC. Pt declines pain in session and left semi-reclined in bed with all needs in reach.    Therapy/Group: Individual Therapy  Truitt Leep Truitt Leep PT, DPT  07/18/2022, 7:41 AM

## 2022-07-18 NOTE — Progress Notes (Signed)
Patient refused lab draws all day per phlebotomist. Encouraged patient to get lab work drawn and stood by while lab tech was present. Labs retrieved.

## 2022-07-18 NOTE — Progress Notes (Signed)
Physical Therapy Session Note  Patient Details  Name: AUDIA FUNARO MRN: [id number] Date of Birth: [birth year]  Today's Date: 07/18/2022 PT Individual Time: 1600 PT Individual Time Calculation (min): 0 min   Short Term Goals: Week 4:  PT Short Term Goal 1 (Week 4): STG=LTGs secondary to ELOS  Skilled Therapeutic Interventions/Progress Updates:    Chart reviewed. Pt found sleeping. Pt politely declined this session 2/2 fatigue. PT explained need to participate in therapy and that pt will have to make up time if missed. Pt again expressed fatigue and also confusion as she had not been made aware of schedule change. Pt verbalized understanding but again politely declined session.  At end of session, pt was left semi-reclined in bed with alarm engaged, nurse call bell and all needs in reach.     Therapy Documentation Precautions:  Precautions Precautions: Fall Precaution Comments: R craniectomy, no bone flap, bone flap placed in abdomen, R hand mitt, coretrak, flexiseal Restrictions Weight Bearing Restrictions: No General: PT Amount of Missed Time (min): 30 Minutes PT Missed Treatment Reason: Patient fatigue     Therapy/Group: Individual Therapy  Dionne Milo, PT, DPT 07/18/2022, 4:10 PM

## 2022-07-19 ENCOUNTER — Other Ambulatory Visit (HOSPITAL_COMMUNITY): Payer: Self-pay

## 2022-07-19 MED ORDER — ACIDOPHILUS PO CAPS
1.0000 | ORAL_CAPSULE | Freq: Three times a day (TID) | ORAL | 0 refills | Status: DC
  Filled 2022-07-19: qty 100, 34d supply, fill #0

## 2022-07-19 MED ORDER — POLYETHYLENE GLYCOL 3350 17 G PO PACK: 17.0000 g | PACK | Freq: Every day | ORAL | 0 refills | Status: DC

## 2022-07-19 MED ORDER — AMLODIPINE BESYLATE 10 MG PO TABS
10.0000 mg | ORAL_TABLET | Freq: Every day | ORAL | 0 refills | Status: DC
  Filled 2022-07-19: qty 30, 30d supply, fill #0

## 2022-07-19 MED ORDER — GABAPENTIN 300 MG PO CAPS
ORAL_CAPSULE | ORAL | 0 refills | Status: DC
  Filled 2022-07-19: qty 120, 30d supply, fill #0

## 2022-07-19 MED ORDER — TRAMADOL HCL 50 MG PO TABS
25.0000 mg | ORAL_TABLET | Freq: Three times a day (TID) | ORAL | 0 refills | Status: DC | PRN
  Filled 2022-07-19: qty 10, 7d supply, fill #0

## 2022-07-19 MED ORDER — DICLOFENAC SODIUM 1 % EX GEL
2.0000 g | Freq: Four times a day (QID) | CUTANEOUS | 0 refills | Status: DC
  Filled 2022-07-19: qty 200, 20d supply, fill #0

## 2022-07-19 MED ORDER — BACLOFEN 5 MG PO TABS
5.0000 mg | ORAL_TABLET | Freq: Two times a day (BID) | ORAL | 0 refills | Status: DC
  Filled 2022-07-19: qty 60, 30d supply, fill #0

## 2022-07-19 MED ORDER — LIDOCAINE 5 % EX PTCH
3.0000 | MEDICATED_PATCH | CUTANEOUS | 0 refills | Status: DC
  Filled 2022-07-19: qty 30, 10d supply, fill #0

## 2022-07-19 MED ORDER — TRAZODONE HCL 100 MG PO TABS
100.0000 mg | ORAL_TABLET | Freq: Every evening | ORAL | 0 refills | Status: DC | PRN
  Filled 2022-07-19: qty 30, 30d supply, fill #0

## 2022-07-19 MED ORDER — FOLIC ACID 1 MG PO TABS
1.0000 mg | ORAL_TABLET | Freq: Every day | ORAL | 0 refills | Status: DC
  Filled 2022-07-19: qty 30, 30d supply, fill #0

## 2022-07-19 MED ORDER — GABAPENTIN 300 MG PO CAPS
300.0000 mg | ORAL_CAPSULE | Freq: Two times a day (BID) | ORAL | Status: DC
  Administered 2022-07-19 – 2022-07-20 (×2): 300 mg via ORAL
  Filled 2022-07-19 (×2): qty 1

## 2022-07-19 NOTE — Progress Notes (Signed)
Occupational Therapy Discharge Summary  Patient Details  Name: Kristin Simmons MRN: [id number] Date of Birth: 1985/03/28  Date of Discharge from OT service:July 19, 2022  Patient has met 12 of 12 long term goals due to improved activity tolerance, improved balance, postural control, ability to compensate for deficits, functional use of  LEFT upper and LEFT lower extremity, improved attention, improved awareness, and improved coordination.  Patient to discharge at overall Supervision/CGA level.  [name] care partner is independent to provide the necessary physical and cognitive assistance at discharge.    Reasons goals not met: n/a  Recommendation:  Patient will benefit from ongoing skilled OT services in outpatient setting to continue to advance functional skills in the area of BADL, iADL, and functional use of L UE .  Equipment: Tub transfer bench, 3-in-1 BSC, wc, small based quad cane  Reasons for discharge: treatment goals met and discharge from hospital  Patient/family agrees with progress made and goals achieved: Yes  OT Discharge Precautions/Restrictions  Precautions Precautions: Fall Precaution Comments: R craniectomy,Helmet on when OOB, no bone flap, bone flap placed in abdomen, Lt AFO with heel lift Restrictions Weight Bearing Restrictions: No Pain  Denies pain ADL ADL Eating: Supervision/safety, Set up Grooming: Supervision/safety Where Assessed-Grooming: Sitting at sink Upper Body Bathing: Contact guard Where Assessed-Upper Body Bathing: Sitting at sink Lower Body Bathing: Contact guard Where Assessed-Lower Body Bathing: Sitting at sink, Standing at sink Upper Body Dressing: Supervision/safety Where Assessed-Upper Body Dressing: Sitting at sink Lower Body Dressing: Contact guard Where Assessed-Lower Body Dressing: Sitting at sink, Standing at sink Toileting: Contact guard Toilet Transfer: Furniture conservator/restorer Method: Other (comment) (stedy +2; or  MOD A +2) Tub/Shower Transfer: Contact guard Vision Baseline Vision/History: 0 No visual deficits Ocular Range of Motion: Restricted on the left Tracking/Visual Pursuits: Decreased smoothness of vertical tracking;Decreased smoothness of horizontal tracking Perception  Perception: Impaired Inattention/Neglect: Does not attend to left visual field;Does not attend to left side of body (Improved since Eval) Praxis Praxis: Impaired Praxis Impairment Details: Initiation;Motor planning Praxis-Other Comments: improved since eval Cognition Cognition Overall Cognitive Status: Impaired/Different from baseline Arousal/Alertness: Awake/alert Orientation Level: Person;Place;Situation Person: Oriented Place: Oriented Situation: Oriented Memory: Appears intact Attention: Selective Sustained Attention: Appears intact Selective Attention: Impaired Awareness: Impaired Problem Solving: Impaired Behaviors: Impulsive Safety/Judgment: Impaired Brief Interview for Mental Status (BIMS) Repetition of Three Words (First Attempt): 3 Temporal Orientation: Year: Correct Temporal Orientation: Month: Accurate within 5 days Temporal Orientation: Day: Correct Recall: "Sock": Yes, no cue required Recall: "Blue": Yes, no cue required Recall: "Bed": Yes, no cue required BIMS Summary Score: 15 Sensation Sensation Light Touch: Impaired by gross assessment Proprioception: Impaired by gross assessment Coordination Gross Motor Movements are Fluid and Coordinated: No Fine Motor Movements are Fluid and Coordinated: No Coordination and Movement Description: L hemi with improvements in strength since initial evaluation Motor  Motor Motor: Hemiplegia;Abnormal tone;Abnormal postural alignment and control Motor - Discharge Observations: L hemi- Much improved since initial evaluation Mobility  Bed Mobility Bed Mobility: Rolling Right;Rolling Left;Supine to Sit;Sit to Supine Rolling Right: Independent with  assistive device Rolling Left: Independent with assistive device Supine to Sit: Independent with assistive device Sit to Supine: Independent with assistive device Transfers Sit to Stand: Contact Guard/Touching assist Stand to Sit: Contact Guard/Touching assist  Trunk/Postural Assessment  Cervical Assessment Cervical Assessment: Within Functional Limits Thoracic Assessment Thoracic Assessment: Within Functional Limits Lumbar Assessment Lumbar Assessment: Exceptions to [facility] (Posterior pelvic tilt) Postural Control Postural Control: Deficits on evaluation Righting Reactions: Delayed but much  improved since initial evaluation  Balance Balance Balance Assessed: Yes Static Sitting Balance Static Sitting - Balance Support: Feet supported;Right upper extremity supported Static Sitting - Level of Assistance: 6: Modified independent (Device/Increase time) Dynamic Sitting Balance Dynamic Sitting - Balance Support: During functional activity Dynamic Sitting - Level of Assistance: 5: Stand by assistance Static Standing Balance Static Standing - Balance Support: Right upper extremity supported Static Standing - Level of Assistance: 4: Min assist;5: Stand by assistance Dynamic Standing Balance Dynamic Standing - Balance Support: During functional activity Dynamic Standing - Level of Assistance: 5: Stand by assistance;4: Min assist Extremity/Trunk Assessment RUE Assessment RUE Assessment: Within Functional Limits LUE Assessment LUE Assessment: Exceptions to [facility] LUE Body System: Neuro Brunstrum levels for arm and hand: Arm;Hand Brunstrum level for arm: Stage II Synergy is developing Brunstrum level for hand: Stage III Synergies performed voluntarily   Mal Amabile 07/19/2022, 3:49 PM

## 2022-07-19 NOTE — Progress Notes (Signed)
Patient ID: Kristin Simmons, female   DOB: 07-25-1985, 37 y.o.   MRN: [id number]  SW faxed PCS referral to [facility] ([phone]/f:[phone]).  *SW spoke with French Ana in processing with [facility] confirming referral received.   SW met with pt in room to review discharge, and confirm DME received. Pt inquired about hospital bed being needed.   SW spoke with [facility] to complete phone assessment. Tentatively approved until nursing assessment completed. Referrals will be sent out the following home care agencies: 1) [facility], 2) [facility] and 3) [facility].  20- SW spoke with pt mother on above, and discharge. Reports hospital bed is not needed. She will be here tomorrow to pick up patient.   Cecile Sheerer, MSW, LCSWA Office: [phone] Cell: [phone] Fax: [phone]

## 2022-07-19 NOTE — Plan of Care (Signed)
°  Problem: RH Swallowing °Goal: LTG Patient will consume least restrictive diet using compensatory strategies with assistance (SLP) °Description: LTG:  Patient will consume least restrictive diet using compensatory strategies with assistance (SLP) °Outcome: Completed/Met °Goal: LTG Pt will demonstrate functional change in swallow as evidenced by bedside/clinical objective assessment (SLP) °Description: LTG: Patient will demonstrate functional change in swallow as evidenced by bedside/clinical objective assessment (SLP) °Outcome: Completed/Met °  °Problem: RH Problem Solving °Goal: LTG Patient will demonstrate problem solving for (SLP) °Description: LTG:  Patient will demonstrate problem solving for basic/complex daily situations with cues  (SLP) °Outcome: Completed/Met °  °Problem: RH Memory °Goal: LTG Patient will use memory compensatory aids to (SLP) °Description: LTG:  Patient will use memory compensatory aids to recall biographical/new, daily complex information with cues (SLP) °Outcome: Completed/Met °  °Problem: RH Attention °Goal: LTG Patient will demonstrate this level of attention during functional activites (SLP) °Description: LTG:  Patient will will demonstrate this level of attention during functional activites (SLP) °Outcome: Completed/Met °  °Problem: RH Awareness °Goal: LTG: Patient will demonstrate awareness during functional activites type of (SLP) °Description: LTG: Patient will demonstrate awareness during functional activites type of (SLP) °Outcome: Completed/Met °  °

## 2022-07-19 NOTE — Patient Care Conference (Signed)
Inpatient RehabilitationTeam Conference and Plan of Care Update Date: 07/19/2022   Time: 11:00 AM    Patient Name: Kristin Simmons      Medical Record Number: [id number]  Date of Birth: October 09, 1985 Sex: Female         Room/Bed: 4W08C/4W08C-01 Payor Info: Payor: MEDICAID [place] / Plan: MEDICAID [place] / Product Type: *No Product type* /    Admit Date/Time:  06/21/2022  5:27 PM  Primary Diagnosis:  SAH (subarachnoid hemorrhage) [facility])  Hospital Problems: Principal Problem:   SAH (subarachnoid hemorrhage) (HCC) Active Problems:   Right internal carotid artery aneurysm   Mild cognitive impairment with memory loss    Expected Discharge Date: Expected Discharge Date: 07/20/22  Team Members Present: Physician leading conference: Dr. Elijah Birk Social Worker Present: Cecile Sheerer, LCSWA Nurse Present: Vedia Pereyra, RN PT Present: Amedeo Plenty, PT OT Present: Kearney Hard, OT SLP Present: Feliberto Gottron, SLP PPS Coordinator present : Fae Pippin, SLP     Current Status/Progress Goal Weekly Team Focus  Bowel/Bladder   Continent of bowel and bladder; LBM: 07/18/22   Remain continent of bowel and bladder.   Assess bowel and bladder needs q shift and PRN.    Swallow/Nutrition/ Hydration   Regular textures with thin liquids, Supervision-Min A for use of swallowing compensatory strategies.   Supervision  Ongoing patient education to maximize safety as patient's d/c date is set for 6/5.    ADL's   CGA/supervision   Min A, upgrade to CGA this week   Addressing family education and dc planning, balance, L UE NMR, NMES    Mobility   Supv bed mobility; CGA for sit/stands and stand pivot transfers; CGA for gait up to 180' + with [facility]; CGA/MinA for stair mobility   CGA/MinA with LRAD  Barriers: AFO consult this afternoon in order to decrease L knee hyperextension- Other than that she is ready to go home and all family education has been completed.    Communication                 Safety/Cognition/ Behavioral Observations  Overall Min A to complete functional and familiar tasks safely in regards to problem solving, selective attention and emergent awarenes. Supervision for recall of functional information.   Supervision-Min A   Ongoing patient education to maximize safety at home as her d/c date is set for 6/5.    Pain   Patient c/o intermittent left shoulder pain.   Pain less than or equal to 2/10.   Assess patient for pain q shfit and PRN. Provide PRN pain medication as requested.    Skin   right crainotomy site- clean, dry, intact/right lower abdominal site- clean, dry, intact.   Patient to remain free from skin breakdown and infection.  Assess skin q shift and PRN.      Discharge Planning:  D/c plan remains pt will discharge to home with support from her family, and children. Pt mother likley to help in the mornings due to working third shift. CAP/DA referral packet received, and documents signed with appropriate clinicals, and faxed back to [facility]. Pt mother will need to manage disability as she initated an application and [facility] unable to assist now. PCS referral faxed to [facility]. Fam mtg held on Thursday (5/16) with pt mother and children's father. Fam edu completed on 5/29, 30, 31 1pm-4pm with children's father. Outpatient PT/OT/SLP at [facility]/ Dme ordered: 3in1 BSC, w/c, and TTB with [employer]. SW will confirm there are no  barriers to discharge.   Team Discussion: SAH. Continent B/B. Pain managed with PRN medications. Helmet on when OOB. Incisions to head and abdomen are healing, no drainage noted. [employer] to come by today to fix AFO to appropriately fit orthotic.   Patient on target to meet rehab goals: yes, will be at goal level at discharge 07/20/22.  *See Care Plan and progress notes for long and short-term goals.   Revisions to Treatment Plan:  N/A  Teaching Needs: Medications, safety, self care, skin care,  gait/training, etc.   Current Barriers to Discharge: Decreased caregiver support  Possible Resolutions to Barriers: Family education completed. DME ordered.      Medical Summary Current Status: medically complicated by R eye pain, headache, parasthesias, mood/behavior difficulty, and SIADH  Barriers to Discharge: Electrolyte abnormality;Medical stability;Self-care education;Behavior/Mood  Barriers to Discharge Comments: hyponatremia, HA/eye pain Possible Resolutions to [employer] Focus: titrate pain medications and monitor for neurologic changes, wean salt tabs while monitorring labs   Continued Need for Acute Rehabilitation Level of Care: The patient requires daily medical management by a physician with specialized training in physical medicine and rehabilitation for the following reasons: Direction of a multidisciplinary physical rehabilitation program to maximize functional independence : Yes Medical management of patient stability for increased activity during participation in an intensive rehabilitation regime.: Yes Analysis of laboratory values and/or radiology reports with any subsequent need for medication adjustment and/or medical intervention. : Yes   I attest that I was present, lead the team conference, and concur with the assessment and plan of the team.   Jearld Adjutant 07/19/2022, 3:56 PM

## 2022-07-19 NOTE — Progress Notes (Signed)
PROGRESS NOTE   Subjective/Complaints:  No acute complaints. No events overnight.   Endorsing back pain to nursing today, R side. HA ongoing but mild. Concerned regarding flap sinking; discussed natural course of healing and ultimate replacement of bone flap.    ROS: + Back pain + R eye disocmfort/HA  Patient denies fever, rash, sore throat, blurred vision, dizziness, nausea, vomiting, diarrhea, cough, shortness of breath or chest pain, joint or back/neck pain,  or mood change.   Objective:   No results found. Recent Labs    07/18/22 1628  WBC 6.5  HGB 11.2*  HCT 34.1*  PLT 266    Recent Labs    07/18/22 1628  NA 134*  K 3.6  CL 105  CO2 22  GLUCOSE 87  BUN 9  CREATININE 0.66  CALCIUM 9.3     Intake/Output Summary (Last 24 hours) at 07/19/2022 0920 Last data filed at 07/19/2022 0745 Gross per 24 hour  Intake 716 ml  Output --  Net 716 ml         Physical Exam: Vital Signs Blood pressure 108/66, pulse 79, temperature 98.9 F (37.2 C), temperature source Oral, resp. rate 17, height 5\' 4"  (1.626 m), weight 51.2 kg, SpO2 100 %.  Constitutional: No distress . Vital signs reviewed. Walking to gym with quad cane, CGA HEENT: NCAT, EOMI, oral membranes moist Neck: supple Cardiovascular: RRR without murmur. No JVD    Respiratory/Chest: CTA Bilaterally without wheezes or rales. Normal effort    GI/Abdomen: BS +, non-tender, non-distended Ext: no clubbing, cyanosis, or edema Psych: flat but cooperative  Skin:  R craniectomy -well healed R abdominal surgical sites - well healed   MSK: + TTP right greater than left forehead  - improved Neuro:          Strength: Exam unchanged on 5/25                RUE: 5/5 SA, 5/5 EF, 5/5 EE, 5/5 WE, 5/5 FF, 5/5 FA                 LUE:  4/5 FF, 3/5 FA, 4/5 EF, 4/5 EE -improved                RLE: 5/5 HF, 5/5 KE, 5/5 DF, 5/5 EHL, 5/5 PF                 LLE:  5-/5 KE,  4/5 ankle DF/PF - improved Sensation: Altered sensation to light touch in left upper extremity and left lower extremity -no longer painful or hypersensitive- improving CN: 2 through 12 intact Good sitting balance    Assessment/Plan: 1. Functional deficits which require 3+ hours per day of interdisciplinary therapy in a comprehensive inpatient rehab setting. Physiatrist is providing close team supervision and 24 hour management of active medical problems listed below. Physiatrist and rehab team continue to assess barriers to discharge/monitor patient progress toward functional and medical goals  Care Tool:  Bathing    Body parts bathed by patient: Left arm, Chest, Abdomen, Right upper leg, Right arm, Buttocks, Front perineal area, Left upper leg, Right lower leg, Left lower leg, Face   Body parts bathed  by helper: Right arm, Front perineal area, Buttocks, Left upper leg, Right lower leg, Left lower leg, Face     Bathing assist Assist Level: Minimal Assistance - Patient > 75%     Upper Body Dressing/Undressing Upper body dressing   What is the patient wearing?: Pull over shirt    Upper body assist Assist Level: Minimal Assistance - Patient > 75%    Lower Body Dressing/Undressing Lower body dressing      What is the patient wearing?: Pants     Lower body assist Assist for lower body dressing: Minimal Assistance - Patient > 75%     Toileting Toileting    Toileting assist Assist for toileting: Minimal Assistance - Patient > 75%     Transfers Chair/bed transfer  Transfers assist  Chair/bed transfer activity did not occur: Safety/medical concerns  Chair/bed transfer assist level: Minimal Assistance - Patient > 75%     Locomotion Ambulation   Ambulation assist      Assist level: Contact Guard/Touching assist Assistive device: Cane-quad (and w/o AD, light min at times.) Max distance: 180   Walk 10 feet activity   Assist     Assist level: Contact  Guard/Touching assist Assistive device: Orthosis, Cane-quad   Walk 50 feet activity   Assist Walk 50 feet with 2 turns activity did not occur: Safety/medical concerns (Patient unable to ambulate >30' at this time secondary to fatigue, poor endurance/activity tolerance, impaired attention, increased pain, etc.)  Assist level: Contact Guard/Touching assist Assistive device: Cane-quad, Orthosis    Walk 150 feet activity   Assist Walk 150 feet activity did not occur: Safety/medical concerns  Assist level: Contact Guard/Touching assist Assistive device: Cane-quad, Orthosis    Walk 10 feet on uneven surface  activity   Assist Walk 10 feet on uneven surfaces activity did not occur: Safety/medical concerns         Wheelchair     Assist Is the patient using a wheelchair?: Yes Type of Wheelchair: Manual (per therapist, pt using TIS/Recline back wheelchair)    Wheelchair assist level: Dependent - Patient 0% Max wheelchair distance: 150'    Wheelchair 50 feet with 2 turns activity    Assist        Assist Level: Dependent - Patient 0%   Wheelchair 150 feet activity     Assist      Assist Level: Dependent - Patient 0%   Blood pressure 108/66, pulse 79, temperature 98.9 F (37.2 C), temperature source Oral, resp. rate 17, height 5\' 4"  (1.626 m), weight 51.2 kg, SpO2 100 %.  1. 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 Conchita Paris. Bone flap in abdominal pocket, plan for subacute flap replacement after IPR             -patient may  shower if cover incisions             -ELOS/Goals: supervision to min A;  6/5 goal DC date   Safety precautions due to poor impulse control - Helmet ordered for OOB 5/8; [facility] and [facility] 5/9   -Continue CIR therapies including PT, OT, and SLP     - 5/14: Starting to advance L leg with some buckling, Min A  goals seem appropriate. SLP attention, recall improving but family is triggering and she has behaviors when family comes in during therapies.   5/15: CT head showing improved edema, small SDH, and unchanged  8 mm midline shift  5/21: poor L attention but getting some functional grasp in LUE; L lateral lean complicating EOB balance; CGA-Min A transfer and Min-Mod transfers mostly c/b poor safety awareness. Walking up to 180 ft with handheld assist, can advance L leg but gets buckling in L knee  despite anterior support AFO.   5/23: Less impulsive over past week, will discuss with therapies and nursing today removing TeleSitter; Dced telesitter.   5/24: was somewhat impulsive overnight, needed reminders to stay in bed. Continue bed alarm and posey belt in WC.  - no further attempt OOB per weekend notes  5/28: Upgrading goals to CGA for OT; needs cueing with PT, quad strength improved buyt some poor awareness. Using small base quad cane now. Min A for steps. CGA goals. SLP trialling D3, still impulsive but doing better.   2 Antithrombotics: -DVT/anticoagulation:  Pharmaceutical: Lovenox initiated 07/04/2022             -antiplatelet therapy: N/A  3. Pain Management/neuropathic pain: Oxycodone as needed  5/8: added lidocaine patch x2 to L neck, shoulder  5/9: Add gabapentin 300 mg TID for LUE and LLE pain, likely related to spasticity and neuropathic.   5/10: Mild lethargy today but no use of PRN pain medications; decrease gabapentin to 100 mg TID  5/11: kpad and left shoulder sling ordered  5/23: Using oxycodone Q4H; continue  5/14: Using tylenol and oxycodone together consistently; schedule tylenol 1000 mg TID and oxycodone 5 mg TID; add oxycodone 5 mg BID Prn for breakthrough  -- Increase gabapentin to 200/200/400 mg daily  5/16: Increase gabapentin to 200/200/600 mg for pain and sleep  5/17 try voltaren gel L shoulder  5/18-5/19- denies pain this AM  5/20: Due to no improvement with prior  measures, discontinue daytime gabapentin, standing Tylenol, and standing oxycodone.  Changed headache regimen to Fioricet 1 tab 4 times daily as needed, and oxycodone 5 mg 4 times daily as needed for pain.  5/21 d/t daytime lethargy + poor pain control, change gabapentin 600 mg QHS to 200 mg TID. DC fioricet, resume scheduled tylenol 1000 mg TID. Continue PRN oxy  5/22: increase QHS gabapentin to 400 mg given increased sensitivity/pain after cutting back from 600 qhs. Add baclofen 5 mg TID PRN for spasms -improved pain control, continue this regimen, encouraged use of as needed baclofen for abdominal pain  5/27: Using Oxy mostly QHS; increase gabapentin to 600 mg QHS, decrease oxy to 5 mg BID PRN; schedule baclofen 5 mg QHS. Add Lidocaine patch over RLQ.  -With improvement  5-30: Diffuse, generalized pain, only endorses when asked.  Suspect muscular, increase baclofen to 5 mg twice daily and DC as needed oxycodone.  Change Tylenol from scheduled to 650 every 6 hours as needed. - used tylenol 1x    5-31: Increase complaints of headache since DC scheduled Tylenol; add back cysts 50 mg 3 times daily scheduled, 1 additional dose daily as needed.  6/2 pt denied headaches when I saw her in the morning and then complained of severe headaches with RN about an hour lateer.   -was lethargic with fioricet before, already on gabapentin   -will try low dose tramadol 25mg  q8 prn to start - improved  6/4: Increase gabapentin to 300/300/600 mg  4. Mood/Behavior/Sleep: PRN twice daily as needed provide emotional support             -antipsychotic agents: N/A  - Sleep log added - poor sleep 3 hours last night. Add  melatonin 5 mg QHS + PRN trazodone 50-75 mg.   5/10: Slept 7 hours overnight, still endorsing poor sleep.  Did not use as needed trazodone, will add 50 mg standing nightly.  5/11: continue trazodone  5/13: Per log slept 5 hours overnight, however was not updated 5-10: Will recheck tomorrow  5.14: slept 7  hours overnight; see above for increased QHS gabapentin  5/15: s sleep log not completed overnight, informed nursing.  Patient endorses ongoing poor sleep, no improvement with increase gabapentin.  5/16: interrupted sleep 7+ hours; increase trazodone to 75 mg and increase gabapentin as above  5/18-20 sleeping better  5/21: Worsening lethargy per therapies over last 2 days; possible etiologies include medication induced, poor pain control, and metabolic/hyponatremia. DC fioricet, decrease nighttime gabapentin, stat CBC and BMP ordered. Sleep log remains interrupted but appropriate.   5/22: CBC, BMP WNL. Re-time sleep medications to 2000. Increase gabapentin QHS as above and Trazadone to 100 mg QHS.   5/28 - doing better with sleep, monitor  5/31-6/3:  Trazodone now PRN--sleeping well   5. Neuropsych/cognition: This patient is not capable of making decisions on her own behalf -not consistently oriented, impulsive with poor awareness..   - Obs for safety  5/19- con't Telesitter and NT for meals; Tele Dced 5/24  6. Skin/Wound Care: Routine skin checks  - 5/8: 3 weeks post-R craniectomy; ordered staple removal from scalp and abdomen. no apparent wounds will DC rectal tube    7. Dysphagia: placed order for Cortrak removal 5/12 Diet Orders (From admission, onward)     Start     Ordered   07/14/22 1000  Diet regular Room service appropriate? Yes; Fluid consistency: Thin  Diet effective 1000       Comments: Full Supervision Slow Rate Small bites/sips Limit distractions  Question Answer Comment  Room service appropriate? Yes   Fluid consistency: Thin      07/14/22 0610          5/13: Monitor POs with cortrak removal 5/18-22 eating well  5/28: Per SLP, trialing upgraded diet today; trialing regular textures 5-29-upgraded   8.  Hyponatremia.  Continue sodium chloride tablets.  Florinef 0.3 mg 3 times daily  - 5/8: Na 134 this AM. Downtrending over last 2 days. No obvious medication  contributors; Already on salt tabs 2g TID. Placed 1500 ml fluid restriction, repeat today 138. Ordered daily BMP x3 days for monitoring. Reduce to Florinef 0.1 mg TID and monitor.   5/9: Na stable 13. Diastolic BP improved, remains high systolic. Orthostatic vitals negative with BP. Given florinef indication is hyponatremia not orthostasis will continue 0.1 mg TID through Friday then DC if no significant drop in Na.   5-10: Na down to 135 with mildly increased lethargy.  Increase Florinef to 0.2 mg 3 times daily, continue daily BMP for 3 days.  5/12: Na improved to 136, decrease Florinef to 0.1mg  TID given hypertension  5/13: NA 136 with decreased Florinef; reduced dosing to twice daily and recheck BMP Wednesday  5/14: DC florinef d/t persistent hypertension; labs pending  5/16: Na stable; will check today, then reduce Na starting tomorrow if appropriate with repeat labs Sunday  5/18- will recheck labs in AM/Sunday- ordered labs- will also reduce Na levels to 2G BID with meals from TID  5/19- Na 138, but reduction hasn't shown up yet, since got lunch NA yesterday- will recheck this week 5/20: NA low 134 with reduction, resume 2 g 3 times daily dosing and recheck on Wednesday  5/21: repeat BMP today stat d/t lethergy - NA 137 - stable - refused recheck 5/22 and 5/24 AM; giving option of today or tomorrow AM, but will need to check Na to continue titrating salt tabs 5/27: agreeable to labs today, re-ordered - stable, decrease salt tabs to 2 tabs BID 5/31: Patient refused lab draws for multiple days, will endorse agreement and then retract on attempt.  Move labs to q. Monday, wean salt tabs to 1 tab twice daily, recheck of cognitive status changes. 6/3: Na 134, will not further wean salt tabs; only intaking 400-500 ccs fluids per day; continue regimen for discharge and plan for OP BMP at follow up     Latest Ref Rng & Units 07/18/2022    4:28 PM 07/11/2022   11:21 AM 07/05/2022    3:06 PM  BMP  Glucose  70 - 99 mg/dL 87  409  90   BUN 6 - 20 mg/dL 9  7  12    Creatinine 0.44 - 1.00 mg/dL 8.11  9.14  7.82   Sodium 135 - 145 mmol/L 134  137  137   Potassium 3.5 - 5.1 mmol/L 3.6  4.0  4.2   Chloride 98 - 111 mmol/L 105  104  102   CO2 22 - 32 mmol/L 22  23  23    Calcium 8.9 - 10.3 mg/dL 9.3  9.8  95.6     10.  Decreased nutritional storage.  Currently on a dysphagia #3 diet.  NGT d/ced   - 5/14: DC SSI, CBG  11.  Urinary retention.  Urecholine 5 mg 3 times daily.  Check PVR - resolved  5/8: No PVRs overnight; ordered. Mixed continence overnight.   5/9: Continue UO and low PVRs. DC urecholine.   5/10: PVRs remain low, continent.  Continue for 24 hours, then DC PVRs.  5/19- PVRs off- resolved  12.  History of tobacco/alcohol use.  Counseling  13. Diarrhea. Remove rectal tube 5/8. Increase banatrol to TID.   - resolved   - 5/9: No recorded Bms since rectal tube removal; reduce banatrol back to BID, obtain KUB  - 5/10: DC banatrol. KUB with mild stool burden.  If no BM today, will need to start laxatives.   5/11: magnesium citrate ordered  5/12: flowsheet reviewd and had type 5 stools today.   5/13: soft stools, last BM 5/12, montior  5/15: Liquid Bms 5/13; now on PO diet, monitor  5/16: add colace 100 mg BID + miralax daily PRN  5/17 schedule miralax  5/18- LBM x2   5/22: change colace to sennakot s 1 tab BID 6/2 LBM   14. HTN: now controlled     07/19/2022    5:00 AM 07/18/2022    8:14 PM 07/18/2022   12:52 PM  Vitals with BMI  Systolic 108 116 213  Diastolic 66 69 97  Pulse 79 77 86    - 5/13: PRN hydralazine used 1x overnight; BP remains significantly elevated with decreased Florinef.  Add amlodipine 5 mg daily.  - 5/14: diastolic remains elevated; add lisinopril 5 mg daily. Will take 2-3 days for amlodipine effect. DC florinef - 5/15: Much improved;  DC lisinopril; Dc amlodipine this PM if remains normotensive 5/16: Diastolic remains elevated increase norvasc to 10 mg  daily 5/17-6/3 controlled overall, continue current   15. L eye pain/pressure: Getting MRI brain and orbits w/o contrast d/t worsening symptoms and poor EOMI tolerance; should have been improving with decreased postop edema   -  5/16: MRI converted to CT d/t clips likely to obscure relevant areas. Found "Minimal hazy asymmetric stranding within the right intraorbitalfat, suspected to reflect a degree of passive venous congestion due to the evolving process within the right cerebral hemisphere." No focal s/s cellulitis, but will get CBC today for eval. No management changes, reassured patient and family.   5-20: Ongoing, Fioricet as above.  Labs appear appropriate, no concern for infection.  5/21: Adjusting pain regimen as above.  5/22-24: EOMI tolerance improving. Monitor.  5/27: EOMI WNL, no further complaints;  6/3: Endorsing again, well controlled with tramadol, cont  16. Itching, generalized  -5/17 No rash noted, may be dry skin related, start moisturizer, continue benadryl crem    - 5/24: topical benadryl to incision sites daily   17.  Posterior shoulder pain left side, discussed with patient this is common post stroke especially with the degree of weakness that she is experiencing.  - Continue PT OT efforts  - continue Voltaren gel 4 times daily  - patient needs K-pad to be positioned over her shoulder.   - Taping , e-stim  - Pain medications as above  LOS: 28 days A FACE TO FACE EVALUATION WAS PERFORMED  Angelina Sheriff 07/19/2022, 9:20 AM

## 2022-07-19 NOTE — Progress Notes (Signed)
Inpatient Rehabilitation Discharge Medication Review by a Pharmacist  A complete drug regimen review was completed for this patient to identify any potential clinically significant medication issues.  High Risk Drug Classes Is patient taking? Indication by Medication  Antipsychotic No   Anticoagulant No   Antibiotic No   Opioid Yes Tramadol prn headaches  Antiplatelet No   Hypoglycemics/insulin No   Vasoactive Medication Yes Amlodipine - BP   Chemotherapy No   Other Yes Baclofen - muscle spasms Gabapentin - pain Lidocaine patches - pain Trazodone - prn sleep Folic acid - supplement     Type of Medication Issue Identified Description of Issue Recommendation(s)  Drug Interaction(s) (clinically significant)     Duplicate Therapy     Allergy     No Medication Administration End Date     Incorrect Dose     Additional Drug Therapy Needed     Significant med changes from prior encounter (inform family/care partners about these prior to discharge).    Other       Clinically significant medication issues were identified that warrant physician communication and completion of prescribed/recommended actions by midnight of the next day:  No  Pharmacist comments: None  Time spent performing this drug regimen review (minutes):  20 minutes  Thank you Okey Regal, PharmD

## 2022-07-19 NOTE — Progress Notes (Signed)
Speech Language Pathology Discharge Summary  Patient Details  Name: Kristin Simmons MRN: [id number] Date of Birth: 1985-09-14  Date of Discharge from SLP service:July 19, 2022  Today's Date: 07/19/2022 SLP Individual Time: 1400-1455 SLP Individual Time Calculation (min): 55 min   Skilled Therapeutic Interventions:  Skilled treatment session focused on cognitive goals. Upon arrival, patient was asleep in bed but easily awakened. Patient sat EOB with extra time and verbalized steps to a safe transfer with overall Min verbal cues. SLP facilitated session by re-administering the [facility] Mental Status Examination (SLUMS). Patient scored  25/30 points with a score of 27 or above considered normal. Patient continues to demonstrate deficits in problem solving. Patient requested to use the bathroom at end of session with Min verbal cues required to pull up her pants all the way on her left side. Patient was continent of bladder. Patient ambulated back to bed and left supine with alarm on and all needs within reach.   Patient has met 6 of 6 long term goals.  Patient to discharge at overall Min;Supervision level.   Reasons goals not met: N/A   Clinical Impression/Discharge Summary: Patient has made functional gains and has met 6 of 6 LTGs this admission. Currently, patient is consuming regular textures with thin liquids without overt s/s of aspiration and overall supervision level verbal cues for use of swallowing compensatory strategies. [name] overall cognitive functioning has also improved with patient requiring supervision-Min A verbal cues to complete functional and familiar tasks safely in regards to problem solving, recall with use of strategies, selective attention, and emergent awareness. Patient and family education is complete and patient will discharge home with 24 hour supervision from family. Patient would benefit from f/u SLP services to maximize her swallowing and cognitive  function and overall functional independence in order to reduce caregiver burden.   Care Partner:  Caregiver Able to Provide Assistance: Yes  Type of Caregiver Assistance: Cognitive;Physical  Recommendation:  24 hour supervision/assistance;Outpatient SLP  Rationale for SLP Follow Up: Maximize cognitive function and independence;Maximize swallowing safety;Reduce caregiver burden   Equipment: N/A   Reasons for discharge: Treatment goals met   Patient/Family Agrees with Progress Made and Goals Achieved: Yes    ,  07/19/2022, 9:33 AM

## 2022-07-19 NOTE — Plan of Care (Signed)
  Problem: RH Balance Goal: LTG: Patient will maintain dynamic sitting balance (OT) Description: LTG:  Patient will maintain dynamic sitting balance with assistance during activities of daily living (OT) Outcome: Completed/Met   Problem: Sit to Stand Goal: LTG:  Patient will perform sit to stand in prep for activites of daily living with assistance level (OT) Description: LTG:  Patient will perform sit to stand in prep for activites of daily living with assistance level (OT) Outcome: Completed/Met   Problem: RH Eating Goal: LTG Patient will perform eating w/assist, cues/equip (OT) Description: LTG: Patient will perform eating with assist, with/without cues using equipment (OT) Outcome: Completed/Met   Problem: RH Grooming Goal: LTG Patient will perform grooming w/assist,cues/equip (OT) Description: LTG: Patient will perform grooming with assist, with/without cues using equipment (OT) Outcome: Completed/Met   Problem: RH Bathing Goal: LTG Patient will bathe all body parts with assist levels (OT) Description: LTG: Patient will bathe all body parts with assist levels (OT) Outcome: Completed/Met   Problem: RH Dressing Goal: LTG Patient will perform upper body dressing (OT) Description: LTG Patient will perform upper body dressing with assist, with/without cues (OT). Outcome: Completed/Met Goal: LTG Patient will perform lower body dressing w/assist (OT) Description: LTG: Patient will perform lower body dressing with assist, with/without cues in positioning using equipment (OT) Outcome: Completed/Met   Problem: RH Toileting Goal: LTG Patient will perform toileting task (3/3 steps) with assistance level (OT) Description: LTG: Patient will perform toileting task (3/3 steps) with assistance level (OT)  Outcome: Completed/Met   Problem: RH Toilet Transfers Goal: LTG Patient will perform toilet transfers w/assist (OT) Description: LTG: Patient will perform toilet transfers with assist,  with/without cues using equipment (OT) Outcome: Completed/Met   Problem: RH Tub/Shower Transfers Goal: LTG Patient will perform tub/shower transfers w/assist (OT) Description: LTG: Patient will perform tub/shower transfers with assist, with/without cues using equipment (OT) Outcome: Completed/Met   Problem: RH Attention Goal: LTG Patient will demonstrate this level of attention during functional activites (OT) Description: LTG:  Patient will demonstrate this level of attention during functional activites  (OT) Outcome: Completed/Met   Problem: RH Awareness Goal: LTG: Patient will demonstrate awareness during functional activites type of (OT) Description: LTG: Patient will demonstrate awareness during functional activites type of (OT) Outcome: Completed/Met

## 2022-07-19 NOTE — Progress Notes (Signed)
Occupational Therapy Session Note  Patient Details  Name: Kristin Simmons MRN: [id number] Date of Birth: Mar 25, 1985  Today's Date: 07/19/2022 OT Individual Time: 8756-4332 OT Individual Time Calculation (min): 45 min    Short Term Goals: Week 4:  OT Short Term Goal 1 (Week 4): LTG=STG 2/2 ELOS  Skilled Therapeutic Interventions/Progress Updates:    Pt greeted laying down in bed, awake and agreeable to OT treatment session focused on increased independence with BADL tasks. Pt able to dress with min cues for hei dressing strategies and only CGA when standing to pull up pants. Supervision for squat-pivot to new wc. Tried to have pt propel wc, but new chair was too high. OT dropped wc down to lower height where her feet could reach to push. Pt returned to room and was left seated in wc with alarm belt on, call bell, father present. OT placed SAEBO e-stim on wrist extensors. SAEBO left on for 60 minutes. OT returned to remove SAEBO with skin intact and no adverse reactions.  Saebo Stim One 330 pulse width 35 Hz pulse rate On 8 sec/ off 8 sec Ramp up/ down 2 sec Symmetrical Biphasic wave form  Max intensity at 500 Ohm load   Therapy Documentation Precautions:  Precautions Precautions: Fall Precaution Comments: R craniectomy, no bone flap, bone flap placed in abdomen, R hand mitt, coretrak, flexiseal Restrictions Weight Bearing Restrictions: No  Pain: Pain Assessment Pain Score: 2  Headache rest and repsitioned   Therapy/Group: Individual Therapy  Mal Amabile 07/19/2022, 12:21 PM

## 2022-07-19 NOTE — Progress Notes (Signed)
Physical Therapy Discharge Summary  Patient Details  Name: Kristin Simmons MRN: [id number] Date of Birth: 03-10-1985  Date of Discharge from PT service:July 19, 2022  Today's Date: 07/19/2022 PT Individual Time: 1st Treatment Session: 1100-1200; 2nd Treatment Session: 4098-1191 PT Individual Time Calculation (min): 60 min; 45 min   Patient has met 8 of 8 long term goals due to improved activity tolerance, improved balance, improved postural control, increased strength, decreased pain, ability to compensate for deficits, functional use of  left upper extremity and left lower extremity, improved attention, improved awareness, and improved coordination.  Patient to discharge at an ambulatory level  CGA/MinA .   [name] care partner is independent to provide the necessary physical and cognitive assistance at discharge.  Reasons goals not met: NA  Recommendation:  Patient will benefit from ongoing skilled PT services in outpatient setting to continue to advance safe functional mobility, address ongoing impairments in dynamic stability, NMR, global strengthening, improved safety awareness, gait, stair mobility, and minimize fall risk.  Equipment: SBQC, 16" manual wheelchair with cushion  Reasons for discharge: treatment goals met and discharge from hospital  Patient/family agrees with progress made and goals achieved: Yes  PT Discharge Precautions/Restrictions Precautions Precautions: Fall Precaution Comments: R craniectomy, no bone flap, bone flap placed in abdomen, Lt AFO with heel lift Restrictions Weight Bearing Restrictions: No Pain No/Denies pain Pain Interference Pain Interference Pain Effect on Sleep: 2. Occasionally Pain Interference with Therapy Activities: 2. Occasionally Pain Interference with Day-to-Day Activities: 2. Occasionally Vision/Perception  Vision - History Ability to See in Adequate Light: 1 Impaired Vision - Assessment Ocular Range of Motion: Restricted  on the left Tracking/Visual Pursuits: Decreased smoothness of vertical tracking;Decreased smoothness of horizontal tracking Perception Perception: Impaired Inattention/Neglect: Does not attend to left visual field;Does not attend to left side of body Praxis Praxis: Impaired Praxis-Other Comments: All improved since initial evaluation  Cognition Overall Cognitive Status: Impaired/Different from baseline Arousal/Alertness: Awake/alert Orientation Level: Oriented to person;Oriented to place;Oriented to situation Sustained Attention: Appears intact Selective Attention: Impaired Memory: Appears intact Awareness: Impaired Problem Solving: Impaired Behaviors: Impulsive Safety/Judgment: Impaired Sensation Sensation Light Touch: Impaired by gross assessment Proprioception: Impaired by gross assessment Coordination Gross Motor Movements are Fluid and Coordinated: No Fine Motor Movements are Fluid and Coordinated: No Coordination and Movement Description: L hemi with improvements in strength since initial evaluation Motor  Motor Motor: Hemiplegia;Abnormal tone;Abnormal postural alignment and control Motor - Discharge Observations: L hemi- Much improved since initial evaluation  Mobility Bed Mobility Bed Mobility: Rolling Right;Rolling Left;Supine to Sit;Sit to Supine Rolling Right: Independent with assistive device Rolling Left: Independent with assistive device Supine to Sit: Independent with assistive device Sit to Supine: Independent with assistive device Transfers Transfers: Sit to Stand;Stand to Sit;Stand Pivot Transfers Sit to Stand: Contact Guard/Touching assist Stand to Sit: Contact Guard/Touching assist Stand Pivot Transfers: Contact Guard/Touching assist Stand Pivot Transfer Details: Verbal cues for precautions/safety;Verbal cues for sequencing;Verbal cues for safe use of DME/AE Transfer (Assistive device): Small based quad cane Locomotion  Gait Ambulation: Yes Gait  Distance (Feet): 200 Feet Assistive device: Small based quad cane Gait Assistance Details: Verbal cues for precautions/safety;Verbal cues for sequencing Gait Gait: Yes Gait Pattern: Impaired Gait Pattern: Decreased stance time - left;Decreased dorsiflexion - left Stairs / Additional Locomotion Stairs: Yes Stairs Assistance: Contact Guard/Touching assist Stair Management Technique: One rail Right Number of Stairs: 12 Height of Stairs: 6 Ramp: Contact Guard/touching assist Curb: Contact Guard/Touching assist Pick up small object from the floor assist level: Contact  Guard/Touching assist Pick up small object from the floor assistive device: Control and instrumentation engineer: Yes Wheelchair Assistance: Doctor, general practice: Right upper extremity;Right lower extremity Wheelchair Parts Management: Needs assistance Distance: 150'  Trunk/Postural Assessment  Cervical Assessment Cervical Assessment: Within Functional Limits Thoracic Assessment Thoracic Assessment: Within Functional Limits Lumbar Assessment Lumbar Assessment: Exceptions to [facility] (Posterior pelvic tilt) Postural Control Postural Control: Deficits on evaluation Righting Reactions: Delayed but much improved since initial evaluation  Balance Balance Balance Assessed: Yes Static Sitting Balance Static Sitting - Balance Support: Feet supported;Right upper extremity supported Static Sitting - Level of Assistance: 6: Modified independent (Device/Increase time) Dynamic Sitting Balance Dynamic Sitting - Balance Support: During functional activity Dynamic Sitting - Level of Assistance: 5: Stand by assistance Static Standing Balance Static Standing - Balance Support: Right upper extremity supported Static Standing - Level of Assistance: 5: Stand by assistance;4: Min assist (CGA) Dynamic Standing Balance Dynamic Standing - Balance Support: During functional activity Dynamic Standing -  Level of Assistance: 5: Stand by assistance;4: Min assist (CGA) Extremity Assessment      RLE Assessment RLE Assessment: Within Functional Limits General Strength Comments: Grossly 4/5 LLE Assessment LLE Assessment: Exceptions to [employer] General Strength Comments: Grossly 2+/5  Skilled Intervention: 1st Treatment Session- Patient greeted sitting upright in wheelchair in her room with father present and agreeable to PT treatment session. Therapist donned socks and tennis shoes for time management (no AFO donned as Olivia from [place] currently has it). Patient propelled manual wheelchair from her room to day room with RUE/RLE and supv- VC for improved obstacle negotiation and increased attention/awareness to her Lt side. Patient performed various sit/stands and stand pivot transfers with [facility] and CGA for safety- Minor VC for stepping all the way to the surface prior to sitting with good ability and recall noted. Therapist fixed anti-tippers on rental wheelchair in order to ensure safety upon discharge home tomorrow. Patient requesting to use the restroom and gait trained to/from day room and the restroom in her room with [facility] and CGA- Patient with continent void and BM and performed pericare independently. Patient stood at the sink to wash her hands with CGA. Patient gait trained to/from day room and main rehab gym with [facility] and CGA- Minor VC for decreased L knee hyperextension with good improvements noted with increased awareness. Patient ascended/descended x12 steps with SBQC/S HR and CGA- VC throughout for placing entire Lt foot on the step when ascending with good improvements noted. Patient returned to her room and left sitting upright in wheelchair with call bell within reach, posey belt on and all needs met.    2nd Treatment Session- Patient greeted sitting upright in wheelchair in her room and agreeable to PT treatment session. Patient transported to the day room for time management. Olivia, from  [place], present to assist with orthotic- Patient trialed various orthotics and eventually chose posterior leaf AFO with heel wedge. This orthotic decreased Lt knee hyperextension the most and improved overall stability/safety and independence.   Patient performed a car transfer with [facility] and CGA- Once seated in the car simulator, patient was able to place B LE into/out of the car independently. Patient then ascended/descended a low grade ramp with SBQC and CGA. Patient returned to her room and transferred from her wheelchair to sitting EOB with CGA/SBA for safety. Patient was able to transition to supine independently.   Saebo Stim One 330 pulse width 35 Hz pulse rate On 8 sec/ off 8 sec Ramp up/ down 2  sec Symmetrical Biphasic wave form  Max intensity at 500 Ohm load Donned while patient was supine in bed and left on for ~30 minutes for improved Lt quad muscle activation and recruitment.   Patient left supine in bed with call bell within reach, bed alarm on and all needs met.       07/19/2022, 3:47 PM

## 2022-07-20 NOTE — Progress Notes (Signed)
PROGRESS NOTE   Subjective/Complaints:  No acute complaints. No events overnight.  Prepared for discharge.    ROS: + LUE parasthesias - ongoing + R eye disocmfort/HA - improving, ongoing  Patient denies fever, rash, sore throat, blurred vision, dizziness, nausea, vomiting, diarrhea, cough, shortness of breath or chest pain, joint or back/neck pain,  or mood change.   Objective:   No results found. Recent Labs    07/18/22 1628  WBC 6.5  HGB 11.2*  HCT 34.1*  PLT 266     Recent Labs    07/18/22 1628  NA 134*  K 3.6  CL 105  CO2 22  GLUCOSE 87  BUN 9  CREATININE 0.66  CALCIUM 9.3      Intake/Output Summary (Last 24 hours) at 07/20/2022 0753 Last data filed at 07/19/2022 1802 Gross per 24 hour  Intake 472 ml  Output --  Net 472 ml         Physical Exam: Vital Signs Blood pressure 109/73, pulse 69, temperature 98 F (36.7 C), temperature source Oral, resp. rate 17, height 5\' 4"  (1.626 m), weight 52.3 kg, SpO2 100 %.  Constitutional: No distress . Vital signs reviewed. Laying in bed.  HEENT: NCAT, EOMI, oral membranes moist Neck: supple Cardiovascular: RRR without murmur. No JVD    Respiratory/Chest: CTA Bilaterally without wheezes or rales. Normal effort    GI/Abdomen: BS +, non-tender, non-distended Ext: no clubbing, cyanosis, or edema Psych: flat but cooperative  Skin:  R craniectomy -well healed R abdominal surgical sites - well healed   MSK: + TTP right greater than left forehead  - improved Neuro:          Strength: Exam unchanged on 5/25                RUE: 5/5 SA, 5/5 EF, 5/5 EE, 5/5 WE, 5/5 FF, 5/5 FA                 LUE:  3/5 FF, 3/5 FA, 3/5 EF, 3/5 EE                  RLE: 5/5 HF, 5/5 KE, 5/5 DF, 5/5 EHL, 5/5 PF                 LLE:  5-/5 KE, 4/5 ankle DF/PF - improved Sensation: Altered sensation to light touch in left upper extremity and left lower extremity -no longer painful or  hypersensitive- improving CN: 2 through 12 intact Tone: MAS 1+ L elbow    Assessment/Plan: 1. Functional deficits which require 3+ hours per day of interdisciplinary therapy in a comprehensive inpatient rehab setting. Physiatrist is providing close team supervision and 24 hour management of active medical problems listed below. Physiatrist and rehab team continue to assess barriers to discharge/monitor patient progress toward functional and medical goals  Care Tool:  Bathing    Body parts bathed by patient: Left arm, Chest, Abdomen, Right upper leg, Right arm, Buttocks, Front perineal area, Left upper leg, Right lower leg, Left lower leg, Face   Body parts bathed by helper: Right arm, Front perineal area, Buttocks, Left upper leg, Right lower leg, Left lower leg, Face  Bathing assist Assist Level: Contact Guard/Touching assist     Upper Body Dressing/Undressing Upper body dressing   What is the patient wearing?: Pull over shirt    Upper body assist Assist Level: Supervision/Verbal cueing    Lower Body Dressing/Undressing Lower body dressing      What is the patient wearing?: Pants, Underwear/pull up     Lower body assist Assist for lower body dressing: Contact Guard/Touching assist     Toileting Toileting    Toileting assist Assist for toileting: Contact Guard/Touching assist     Transfers Chair/bed transfer  Transfers assist  Chair/bed transfer activity did not occur: Safety/medical concerns  Chair/bed transfer assist level: Contact Guard/Touching assist     Locomotion Ambulation   Ambulation assist      Assist level: Contact Guard/Touching assist Assistive device: Cane-quad Max distance: 200   Walk 10 feet activity   Assist     Assist level: Contact Guard/Touching assist Assistive device: Orthosis, Cane-quad   Walk 50 feet activity   Assist Walk 50 feet with 2 turns activity did not occur: Safety/medical concerns (Patient unable to  ambulate >30' at this time secondary to fatigue, poor endurance/activity tolerance, impaired attention, increased pain, etc.)  Assist level: Contact Guard/Touching assist Assistive device: Cane-quad, Orthosis    Walk 150 feet activity   Assist Walk 150 feet activity did not occur: Safety/medical concerns  Assist level: Contact Guard/Touching assist Assistive device: Cane-quad, Orthosis    Walk 10 feet on uneven surface  activity   Assist Walk 10 feet on uneven surfaces activity did not occur: Safety/medical concerns   Assist level: Contact Guard/Touching assist Assistive device: Orthosis, Cane-quad   Wheelchair     Assist Is the patient using a wheelchair?: Yes Type of Wheelchair: Manual    Wheelchair assist level: Supervision/Verbal cueing Max wheelchair distance: 150'    Wheelchair 50 feet with 2 turns activity    Assist        Assist Level: Supervision/Verbal cueing   Wheelchair 150 feet activity     Assist      Assist Level: Supervision/Verbal cueing   Blood pressure 109/73, pulse 69, temperature 98 F (36.7 C), temperature source Oral, resp. rate 17, height 5\' 4"  (1.626 m), weight 52.3 kg, SpO2 100 %.  1. 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 Conchita Paris. Bone flap in abdominal pocket, plan for subacute flap replacement after IPR             -patient may  shower if cover incisions             -ELOS/Goals: supervision to min A;  6/5 goal DC date   Safety precautions due to poor impulse control - Helmet ordered for OOB 5/8; WHO and [facility] 5/9   - Stable for discharge home    - 5/14: Starting to advance L leg with some buckling, Min A goals seem appropriate. SLP attention, recall improving but family is triggering and she has behaviors when family comes in during therapies.   5/15: CT head showing improved edema,  small SDH, and unchanged 8 mm midline shift  5/21: poor L attention but getting some functional grasp in LUE; L lateral lean complicating EOB balance; CGA-Min A transfer and Min-Mod transfers mostly c/b poor safety awareness. Walking up to 180 ft with handheld assist, can advance L leg but gets buckling in L knee  despite anterior support  AFO.   5/23: Less impulsive over past week, will discuss with therapies and nursing today removing TeleSitter; Dced telesitter.   5/24: was somewhat impulsive overnight, needed reminders to stay in bed. Continue bed alarm and posey belt in WC.  - no further attempt OOB per weekend notes  5/28: Upgrading goals to CGA for OT; needs cueing with PT, quad strength improved buyt some poor awareness. Using small base quad cane now. Min A for steps. CGA goals. SLP trialling D3, still impulsive but doing better.   2 Antithrombotics: -DVT/anticoagulation:  Pharmaceutical: Lovenox initiated 07/04/2022             -antiplatelet therapy: N/A  3. Pain Management/neuropathic pain: Oxycodone as needed  5/8: added lidocaine patch x2 to L neck, shoulder  5/9: Add gabapentin 300 mg TID for LUE and LLE pain, likely related to spasticity and neuropathic.   5/10: Mild lethargy today but no use of PRN pain medications; decrease gabapentin to 100 mg TID  5/11: kpad and left shoulder sling ordered  5/23: Using oxycodone Q4H; continue  5/14: Using tylenol and oxycodone together consistently; schedule tylenol 1000 mg TID and oxycodone 5 mg TID; add oxycodone 5 mg BID Prn for breakthrough  -- Increase gabapentin to 200/200/400 mg daily  5/16: Increase gabapentin to 200/200/600 mg for pain and sleep  5/17 try voltaren gel L shoulder  5/18-5/19- denies pain this AM  5/20: Due to no improvement with prior measures, discontinue daytime gabapentin, standing Tylenol, and standing oxycodone.  Changed headache regimen to Fioricet 1 tab 4 times daily as needed, and oxycodone 5 mg 4 times daily as  needed for pain.  5/21 d/t daytime lethargy + poor pain control, change gabapentin 600 mg QHS to 200 mg TID. DC fioricet, resume scheduled tylenol 1000 mg TID. Continue PRN oxy  5/22: increase QHS gabapentin to 400 mg given increased sensitivity/pain after cutting back from 600 qhs. Add baclofen 5 mg TID PRN for spasms -improved pain control, continue this regimen, encouraged use of as needed baclofen for abdominal pain  5/27: Using Oxy mostly QHS; increase gabapentin to 600 mg QHS, decrease oxy to 5 mg BID PRN; schedule baclofen 5 mg QHS. Add Lidocaine patch over RLQ.  -With improvement  5-30: Diffuse, generalized pain, only endorses when asked.  Suspect muscular, increase baclofen to 5 mg twice daily and DC as needed oxycodone.  Change Tylenol from scheduled to 650 every 6 hours as needed. - used tylenol 1x    5-31: Increase complaints of headache since DC scheduled Tylenol; add back cysts 50 mg 3 times daily scheduled, 1 additional dose daily as needed.  6/2 pt denied headaches when I saw her in the morning and then complained of severe headaches with RN about an hour lateer.   -was lethargic with fioricet before, already on gabapentin   -will try low dose tramadol 25mg  q8 prn to start - improved  6/4: Increase gabapentin to 300/300/600 mg  4. Mood/Behavior/Sleep: PRN twice daily as needed provide emotional support             -antipsychotic agents: N/A  - Sleep log added - poor sleep 3 hours last night. Add melatonin 5 mg QHS + PRN trazodone 50-75 mg.   5/10: Slept 7 hours overnight, still endorsing poor sleep.  Did not use as needed trazodone, will add 50 mg standing nightly.  5/11: continue trazodone  5/13: Per log slept 5 hours overnight, however was not updated 5-10: Will recheck tomorrow  5.14:  slept 7 hours overnight; see above for increased QHS gabapentin  5/15: s sleep log not completed overnight, informed nursing.  Patient endorses ongoing poor sleep, no improvement with increase  gabapentin.  5/16: interrupted sleep 7+ hours; increase trazodone to 75 mg and increase gabapentin as above  5/18-20 sleeping better  5/21: Worsening lethargy per therapies over last 2 days; possible etiologies include medication induced, poor pain control, and metabolic/hyponatremia. DC fioricet, decrease nighttime gabapentin, stat CBC and BMP ordered. Sleep log remains interrupted but appropriate.   5/22: CBC, BMP WNL. Re-time sleep medications to 2000. Increase gabapentin QHS as above and Trazadone to 100 mg QHS.   5/28 - doing better with sleep, monitor  5/31-6/3:  Trazodone now PRN--sleeping well   5. Neuropsych/cognition: This patient is not capable of making decisions on her own behalf -not consistently oriented, impulsive with poor awareness..   - Obs for safety  5/19- con't Telesitter and NT for meals; Tele Dced 5/24  6. Skin/Wound Care: Routine skin checks  - 5/8: 3 weeks post-R craniectomy; ordered staple removal from scalp and abdomen. no apparent wounds will DC rectal tube    7. Dysphagia: placed order for Cortrak removal 5/12 Diet Orders (From admission, onward)     Start     Ordered   07/14/22 1000  Diet regular Room service appropriate? Yes; Fluid consistency: Thin  Diet effective 1000       Comments: Full Supervision Slow Rate Small bites/sips Limit distractions  Question Answer Comment  Room service appropriate? Yes   Fluid consistency: Thin      07/14/22 0610          5/13: Monitor POs with cortrak removal 5/18-22 eating well  5/28: Per SLP, trialing upgraded diet today; trialing regular textures 5-29-upgraded   8.  Hyponatremia.  Continue sodium chloride tablets.  Florinef 0.3 mg 3 times daily  - 5/8: Na 134 this AM. Downtrending over last 2 days. No obvious medication contributors; Already on salt tabs 2g TID. Placed 1500 ml fluid restriction, repeat today 138. Ordered daily BMP x3 days for monitoring. Reduce to Florinef 0.1 mg TID and monitor.   5/9: Na  stable 13. Diastolic BP improved, remains high systolic. Orthostatic vitals negative with BP. Given florinef indication is hyponatremia not orthostasis will continue 0.1 mg TID through Friday then DC if no significant drop in Na.   5-10: Na down to 135 with mildly increased lethargy.  Increase Florinef to 0.2 mg 3 times daily, continue daily BMP for 3 days.  5/12: Na improved to 136, decrease Florinef to 0.1mg  TID given hypertension  5/13: NA 136 with decreased Florinef; reduced dosing to twice daily and recheck BMP Wednesday  5/14: DC florinef d/t persistent hypertension; labs pending  5/16: Na stable; will check today, then reduce Na starting tomorrow if appropriate with repeat labs Sunday  5/18- will recheck labs in AM/Sunday- ordered labs- will also reduce Na levels to 2G BID with meals from TID  5/19- Na 138, but reduction hasn't shown up yet, since got lunch NA yesterday- will recheck this week 5/20: NA low 134 with reduction, resume 2 g 3 times daily dosing and recheck on Wednesday 5/21: repeat BMP today stat d/t lethergy - NA 137 - stable - refused recheck 5/22 and 5/24 AM; giving option of today or tomorrow AM, but will need to check Na to continue titrating salt tabs 5/27: agreeable to labs today, re-ordered - stable, decrease salt tabs to 2 tabs BID 5/31: Patient refused  lab draws for multiple days, will endorse agreement and then retract on attempt.  Move labs to q. Monday, wean salt tabs to 1 tab twice daily, recheck of cognitive status changes. 6/3: Na 134, will not further wean salt tabs; only intaking 400-500 ccs fluids per day; continue regimen for discharge and plan for OP BMP at follow up     Latest Ref Rng & Units 07/18/2022    4:28 PM 07/11/2022   11:21 AM 07/05/2022    3:06 PM  BMP  Glucose 70 - 99 mg/dL 87  161  90   BUN 6 - 20 mg/dL 9  7  12    Creatinine 0.44 - 1.00 mg/dL 0.96  0.45  4.09   Sodium 135 - 145 mmol/L 134  137  137   Potassium 3.5 - 5.1 mmol/L 3.6  4.0  4.2    Chloride 98 - 111 mmol/L 105  104  102   CO2 22 - 32 mmol/L 22  23  23    Calcium 8.9 - 10.3 mg/dL 9.3  9.8  81.1     10.  Decreased nutritional storage.  Currently on a dysphagia #3 diet.  NGT d/ced   - 5/14: DC SSI, CBG  11.  Urinary retention.  Urecholine 5 mg 3 times daily.  Check PVR - resolved  5/8: No PVRs overnight; ordered. Mixed continence overnight.   5/9: Continue UO and low PVRs. DC urecholine.   5/10: PVRs remain low, continent.  Continue for 24 hours, then DC PVRs.  5/19- PVRs off- resolved  12.  History of tobacco/alcohol use.  Counseling  13. Diarrhea. Remove rectal tube 5/8. Increase banatrol to TID.   - resolved   - 5/9: No recorded Bms since rectal tube removal; reduce banatrol back to BID, obtain KUB  - 5/10: DC banatrol. KUB with mild stool burden.  If no BM today, will need to start laxatives.   5/11: magnesium citrate ordered  5/12: flowsheet reviewd and had type 5 stools today.   5/13: soft stools, last BM 5/12, montior  5/15: Liquid Bms 5/13; now on PO diet, monitor  5/16: add colace 100 mg BID + miralax daily PRN  5/17 schedule miralax  5/18- LBM x2   5/22: change colace to sennakot s 1 tab BID 6/2 LBM   14. HTN: now controlled     07/20/2022    6:16 AM 07/19/2022    7:35 PM 07/19/2022    5:48 PM  Vitals with BMI  Weight   115 lbs 5 oz  BMI   19.78  Systolic 109 113   Diastolic 73 77   Pulse 69 79     - 5/13: PRN hydralazine used 1x overnight; BP remains significantly elevated with decreased Florinef.  Add amlodipine 5 mg daily.  - 5/14: diastolic remains elevated; add lisinopril 5 mg daily. Will take 2-3 days for amlodipine effect. DC florinef - 5/15: Much improved;  DC lisinopril; Dc amlodipine this PM if remains normotensive 5/16: Diastolic remains elevated increase norvasc to 10 mg daily 5/17-6/3 controlled overall, continue current   15. L eye pain/pressure: Getting MRI brain and orbits w/o contrast d/t worsening symptoms and poor EOMI  tolerance; should have been improving with decreased postop edema   - 5/16: MRI converted to CT d/t clips likely to obscure relevant areas. Found "Minimal hazy asymmetric stranding within the right intraorbitalfat, suspected to reflect a degree of passive venous congestion due to the evolving process within the right cerebral hemisphere." No  focal s/s cellulitis, but will get CBC today for eval. No management changes, reassured patient and family.   5-20: Ongoing, Fioricet as above.  Labs appear appropriate, no concern for infection.  5/21: Adjusting pain regimen as above.  5/22-24: EOMI tolerance improving. Monitor.  5/27: EOMI WNL, no further complaints;  6/3: Endorsing again, well controlled with tramadol, cont  16. Itching, generalized  -5/17 No rash noted, may be dry skin related, start moisturizer, continue benadryl crem    - 5/24: topical benadryl to incision sites daily   17.  Posterior shoulder pain left side, discussed with patient this is common post stroke especially with the degree of weakness that she is experiencing.  - Continue PT OT efforts  - continue Voltaren gel 4 times daily  - patient needs K-pad to be positioned over her shoulder.   - Taping , e-stim  - Pain medications as above  18. L elbow tone. On baclofen 5 mg BID. Consider botox at OP f/u.   LOS: 29 days A FACE TO FACE EVALUATION WAS PERFORMED  Angelina Sheriff 07/20/2022, 7:53 AM

## 2022-07-20 NOTE — Progress Notes (Signed)
Patient DC this am with her mother. Patient nor patients mother has an nursing questions at this time. Medications picked up from [facility] and delivered to patients mother. Room pack.

## 2022-07-20 NOTE — Progress Notes (Signed)
Inpatient Rehabilitation Care Coordinator Discharge Note   Patient Details  Name: Kristin Simmons MRN: [id number] Date of Birth: August 07, 1985   Discharge location: D/c to home with support from her mother, children, and children's father.  Length of Stay: 28 days  Discharge activity level: ambulatory level  CGA/MinA  Home/community participation: Limited  Patient response WU:JWJXBJ Literacy - How often do you need to have someone help you when you read instructions, pamphlets, or other written material from your doctor or pharmacy?: Never  Patient response YN:WGNFAO Isolation - How often do you feel lonely or isolated from those around you?: Never  Services provided included: MD, RD, PT, OT, SLP, RN, Pharmacy, Neuropsych, SW, CM, TR  Financial Services:  Field seismologist Utilized: Medicaid    Choices offered to/list presented to: patient mother  Follow-up services arranged:  DME, Outpatient    Outpatient Servicies: [facility] for outpatient PT/OT/SLP DME : [facility] for w/c, TTB, and 3in1 BSC    Patient response to transportation need: Is the patient able to respond to transportation needs?: Yes In the past 12 months, has lack of transportation kept you from medical appointments or from getting medications?: No In the past 12 months, has lack of transportation kept you from meetings, work, or from getting things needed for daily living?: No   Patient/Family verbalized understanding of follow-up arrangements:  Yes  Individual responsible for coordination of the follow-up plan: contact pt mother Mollie Germany [phone]  Confirmed correct DME delivered: Gretchen Short 07/20/2022    Comments (or additional information):PCS referral accepted, and tentatively approved (phone assessment) until nursing with [facility] comes out to the home to complete assessment. CAP/DA referral faxed and decision pending at this time.   Summary of Stay    Date/Time  Discharge Planning CSW  07/18/22 1412 D/c plan remains pt will discharge to home with support from her family, and children. Pt mother likley to help in the mornings due to working third shift. CAP/DA referral packet received, and documents signed with appropriate clinicals, and faxed back to [facility]. Pt mother will need to manage disability as she initated an application and [facility] unable to assist now. PCS referral faxed to [facility]. Fam mtg held on Thursday (5/16) with pt mother and children's father. Fam edu completed on 5/29, 30, 31 1pm-4pm with children's father. Outpatient PT/OT/SLP at [facility]/ Dme ordered: 3in1 BSC, w/c, and TTB with [facility]. SW will confirm there are no barriers to discharge. AAC  07/11/22 1157 D/c plan remains pt will discharge to home with support from her family, and children. Pt mother likley to help in the mornings due to working third shift. CAP/DA referral packet receibved, and documents signed with approprite clinicals, and faxed back to [facility]. Pt mother will need to manage disability as she initated an application and [facility] unable to assist now. PCS referral will be submitted closer towards discharge. Fam mtg held on Thursday (5/16) with pt mother and children's father. Children's father scheduled for fam edu 5/29, 30, 31 1pm-4pm. SW will confirm there are no barriers to discharge. AAC  07/04/22 1134 D/c plan remains pt will discharge to home with support from her family, and children. Pt mother likley to help in the mornings due to working third shift. CAP/DA referral submitted. Pt mother will need to manage disability as she initated an application and [facility] unable to assist now. PCS referral will be submitted closer towards discharge. Fam mtg held on Thursday (5/16) with  pt mother and children's father. Children's father scheduled for fam edu 5/29, 30, 31 1pm-4pm. SW will confirm there are no barriers to discharge. AAC  06/27/22  1026 Pt will discharge to home with support from her family, and children. Pt mother likley to help in the mornings due to working third shift. CAP/DA referral submitted, and disability referral submitted to [facility]. PCS referral will be submitted closer towards discharge. AAC        A Lula Olszewski

## 2022-07-21 ENCOUNTER — Telehealth: Payer: Self-pay | Admitting: Registered Nurse

## 2022-07-21 NOTE — Telephone Encounter (Signed)
Transitional Care call Transitional Questions answered by Ms. Kristin Simmons Mother: Ms. Kristin Simmons  Patient name: Kristin Simmons DOB: 07/21/1985 Are you/is patient experiencing any problems since coming home? No Are there any questions regarding any aspect of care? No Are there any questions regarding medications administration/dosing? No Are meds being taken as prescribed? Yes  "Patient should review meds with caller to confirm" Medication List Reviewed.  Have there been any falls? No  Has Home Health been to the house and/or have they contacted you? She has a scheduled appointment with [facility] on 07/27/2022 If not, have you tried to contact them? NA Can we help you contact them? NA Are bowels and bladder emptying properly? Yes Are there any unexpected incontinence issues? No If applicable, is patient following bowel/bladder programs? NA Any fevers, problems with breathing, unexpected pain? No Are there any skin problems or new areas of breakdown? No Has the patient/family member arranged specialty MD follow up (ie cardiology/neurology/renal/surgical/etc.)?  Ms. Kristin Simmons will call her PCP to schedule HFU appointment. She was given Dr Conchita Paris number to call to schedule HFU appointment. She verbalizes understanding.  Can we help arrange? No Does the patient need any other services or support that we can help arrange? No Are caregivers following through as expected in assisting the patient? Yes Has the patient quit smoking, drinking alcohol, or using drugs as recommended? Yes  Appointment date/time 08/02/2022,  arrival time 10:00 for 10:20 appointment, with Jones Bales ANP-C. At  [address] [facility] [address]

## 2022-07-27 ENCOUNTER — Encounter: Payer: Self-pay | Admitting: Occupational Therapy

## 2022-07-27 ENCOUNTER — Other Ambulatory Visit: Payer: Self-pay

## 2022-07-27 ENCOUNTER — Encounter: Payer: Self-pay | Admitting: Physical Therapy

## 2022-07-27 ENCOUNTER — Ambulatory Visit: Payer: Medicaid Other | Attending: Physician Assistant | Admitting: Physical Therapy

## 2022-07-27 ENCOUNTER — Ambulatory Visit: Payer: Medicaid Other

## 2022-07-27 ENCOUNTER — Ambulatory Visit: Payer: Medicaid Other | Admitting: Occupational Therapy

## 2022-07-27 VITALS — BP 114/79 | HR 98

## 2022-07-27 DIAGNOSIS — M6281 Muscle weakness (generalized): Secondary | ICD-10-CM | POA: Diagnosis present

## 2022-07-27 DIAGNOSIS — R41841 Cognitive communication deficit: Secondary | ICD-10-CM | POA: Insufficient documentation

## 2022-07-27 DIAGNOSIS — R29818 Other symptoms and signs involving the nervous system: Secondary | ICD-10-CM | POA: Diagnosis present

## 2022-07-27 DIAGNOSIS — I69054 Hemiplegia and hemiparesis following nontraumatic subarachnoid hemorrhage affecting left non-dominant side: Secondary | ICD-10-CM | POA: Diagnosis present

## 2022-07-27 DIAGNOSIS — M62838 Other muscle spasm: Secondary | ICD-10-CM | POA: Diagnosis present

## 2022-07-27 DIAGNOSIS — R2689 Other abnormalities of gait and mobility: Secondary | ICD-10-CM

## 2022-07-27 DIAGNOSIS — R2681 Unsteadiness on feet: Secondary | ICD-10-CM | POA: Insufficient documentation

## 2022-07-27 DIAGNOSIS — I609 Nontraumatic subarachnoid hemorrhage, unspecified: Secondary | ICD-10-CM | POA: Insufficient documentation

## 2022-07-27 DIAGNOSIS — R278 Other lack of coordination: Secondary | ICD-10-CM

## 2022-07-27 NOTE — Therapy (Signed)
OUTPATIENT PHYSICAL THERAPY NEURO EVALUATION   Patient Name: Kristin Simmons MRN: [id number] DOB:08-20-1985, 37 y.o., female Today's Date: 07/27/2022   PCP: PCP listed in system is incorrect, pt trying to establish care in [place].  PT provided paper on establishing PCP through [facility]. REFERRING PROVIDER: Charlton Amor, PA-C  END OF SESSION:  PT End of Session - 07/27/22 1536     Visit Number 1    Number of Visits 9   8 + eval   Date for PT Re-Evaluation 09/30/22   pushed out due to multi-disciplinary scheduling needs.   Authorization Type MEDICAID OF [place]    PT Start Time 1530   handoff from OT   PT Stop Time 1615    PT Time Calculation (min) 45 min    Equipment Utilized During Treatment Gait belt;Other (comment)   personal NBQC and helmet   Behavior During Therapy Restless;Flat affect   pt mildly agitated at end of visit due to length of visits today.            Past Medical History:  Diagnosis Date   Anemia    Anxiety    Arthritis    Asthma    Cancer (HCC)    cervical   Depression    Headache    Hypertension    She was taking her mother's blood pressure meds but states she doesn't need bp meds now.   Vaginal Pap smear, abnormal    Past Surgical History:  Procedure Laterality Date   CRANIOTOMY Right 05/31/2022   Procedure: RIGHT PTERIONAL CRANIOTOMY FOR CLIPPING OF MIDDLE CEREBRAL ARTERY  ANEURYSM WITH PLACEMENT OF BONEFLAP IN ABDOMEN;  Surgeon: Lisbeth Renshaw, MD;  Location: [facility] OR;  Service: Neurosurgery;  Laterality: Right;   IR 3D INDEPENDENT WKST  03/08/2022   IR ANGIO INTRA EXTRACRAN SEL INTERNAL CAROTID BILAT MOD SED  03/08/2022   IR ANGIO VERTEBRAL SEL VERTEBRAL BILAT MOD SED  03/08/2022   IR ANGIOGRAM FOLLOW UP STUDY  03/08/2022   IR ANGIOGRAM FOLLOW UP STUDY  03/08/2022   IR ANGIOGRAM FOLLOW UP STUDY  03/08/2022   IR NEURO EACH ADD'L AFTER BASIC UNI RIGHT (MS)  03/08/2022   IR TRANSCATH/EMBOLIZ  03/08/2022   NO PAST SURGERIES     RADIOLOGY WITH  ANESTHESIA N/A 03/08/2022   Procedure: Diagnostic angiogram, Possible Coil Embolization of Aneurysm;  Surgeon: Lisbeth Renshaw, MD;  Location: [facility] OR;  Service: Radiology;  Laterality: N/A;   TUBAL LIGATION Bilateral 03/27/2018   Procedure: POST PARTUM TUBAL LIGATION;  Surgeon: Levie Heritage, DO;  Location: [facility];  Service: Gynecology;  Laterality: Bilateral;   Patient Active Problem List   Diagnosis Date Noted   Mild cognitive impairment with memory loss 07/05/2022   Right internal carotid artery aneurysm 06/21/2022   Brain bleed (HCC) 06/02/2022   Status post craniotomy 05/31/2022   SAH (subarachnoid hemorrhage) (HCC) 05/31/2022   Cerebral aneurysm 03/08/2022   Aneurysm, cerebral, nonruptured 03/08/2022   Other spondylosis with radiculopathy, cervical region 04/15/2021   PROM (premature rupture of membranes) 03/26/2018   Postpartum hypertension 03/26/2018   Trichomoniasis 12/05/2017   High risk social situation  12/05/2017   Supervision of normal pregnancy 10/19/2017   History of cocaine use 10/19/2017   Adjustment disorder with depressed mood 09/13/2017   Anxiety disorder, unspecified 09/12/2017   Abnormal Pap smear of cervix 01/18/2016   Smoker 01/13/2016   Depression 01/13/2016   Alcohol use affecting pregnancy, first trimester 01/13/2016   History of preterm delivery, currently  pregnant 01/13/2016   Underweight 01/13/2016    ONSET DATE: 05/31/2022  REFERRING DIAG: I60.9 (ICD-10-CM) - SAH (subarachnoid hemorrhage) (HCC)  THERAPY DIAG:  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  She is having some thigh cramping.  She denies further issues with the leg as long as she has her brace on. Pt accompanied by: family member-mom Avie Arenas  PERTINENT HISTORY: Depression, Cocaine use, anxiety, Right internal carotid artery aneurysm, mild cognitive impairment with memory loss  PAIN:  Are you having pain? No  PRECAUTIONS: Fall and Other: craniectomy helmet must be worn when OOB per mom's reports ; Left AFO w/ heel lift, right abdominal placement of craniectomy bone flap  WEIGHT BEARING RESTRICTIONS: No  FALLS: Has patient fallen in last 6 months? No  LIVING ENVIRONMENT: Lives with: lives with their daughter-daughter is 12 Lives in: House/apartment Stairs: Yes: Internal: 15 steps; on left going up Has following equipment at home: Counselling psychologist, Wheelchair (manual), Shower bench, bed side commode, and craniectomy helmet and left posterior Thuasne AFO  PLOF: Independent with household mobility without device, Needs assistance with ADLs, Needs assistance with homemaking, Needs assistance with gait, Needs assistance with transfers, and daughter is managing medication times  PATIENT GOALS: "Get my feeling back in my arm and my hand."  "To get my skull put back on my head."  OBJECTIVE:   DIAGNOSTIC FINDINGS: Most recent Head CT 06/29/2022: IMPRESSION: 1. Postoperative changes from prior right pterional decompressive craniectomy with underlying evolving right MCA distribution infarct. Swelling has decreased since prior, with decreased protrusion of brain parenchyma through the craniectomy defect. Previously seen hyperdense blood products within this region have largely resolved. 2. Residual small  volume subdural blood along the falx and tentorium, measuring up to 2-3 mm without significant mass effect. 3. Persistent 8 mm left-to-right shift due to herniation through the craniectomy defect, similar to prior.  COGNITION: Overall cognitive status:  Per ST-delayed auditory/written processing and flat affect   SENSATION: Light touch: [facility]  COORDINATION: Bilateral Heel-to-shin:  WFL, ROM limited due to hip flexor weakness.  EDEMA:  None noted in BLE.  MUSCLE TONE: None noted during functional assessment.  No clonus noted during transfers.  POSTURE: No Significant postural limitations  LOWER EXTREMITY ROM/MMT:     Active  Right Eval Left Eval  Hip flexion 5/5 3/5  Hip extension    Hip abduction " 4-/5  Hip adduction " 4/5  Hip internal rotation    Hip external rotation    Knee flexion " 4+/5  Knee extension " 4-/5  Ankle dorsiflexion " Did not remove AFO, palpable toe extension through top of shoe.  Ankle plantarflexion    Ankle inversion    Ankle eversion     (Blank rows = not tested)  BED MOBILITY:  Sit to supine Complete Independence Supine to sit Min A Rolling to Right Complete Independence Rolling to Left Complete Independence  TRANSFERS: Assistive device utilized: Quad cane small base  Sit to stand: Modified independence Stand to sit: Modified independence Chair to chair: SBA  GAIT: Gait pattern: step to pattern, decreased arm swing- Left, decreased stride length, decreased hip/knee flexion- Left, decreased ankle dorsiflexion- Left, narrow BOS, and poor foot clearance- Left Distance walked: various clinic distances Assistive device utilized: Quad cane small base and Left posterior Thuasne AFO w/ heel lift Level of assistance: SBA  FUNCTIONAL TESTS:  5 times sit to stand: 16.03 seconds w/ RUE support and right lateral lean Timed up and go (TUG): 26.41 seconds w/ NBQC SBA, decreased initiation of task/increased auditory processing time needed 10 meter  walk test: 19.03 seconds w/ NBQC and AFO = 0.53 m/sec OR 1.73 ft/sec Berg Balance Scale: To be assessed.  PATIENT SURVEYS:  FOTO Not captured on intake/eval.  TODAY'S TREATMENT:                                                                                                                              DATE: N/A eval only.   PATIENT EDUCATION: Education details: PT POC, assessments used and to be used, and goals to be set.  Discussed communication w/ OT regarding pt preference in POC frequency for PT vs OT.  Ophthalmologist follow-up due to right eye irritation and reported nerve pain behind eye. Person educated: Patient and Parent Education method: Explanation Education comprehension: verbalized understanding and needs further education  HOME EXERCISE PROGRAM: To be established.  GOALS: Goals reviewed with patient? Yes  SHORT TERM GOALS: Target date: 08/26/2022  Pt will be independent and compliant with strength and balance focused HEP in order to maintain functional progress and improve mobility. Baseline:  To be established. Goal status: INITIAL  2.  BERG to be assessed w/ STG set as appropriate. Baseline: To be assessed. Goal status: INITIAL  3.  Pt will decrease 5xSTS to </=13 seconds w/o UE support in order to demonstrate decreased risk for falls and improved functional bilateral LE strength and power. Baseline: 16.03 seconds w/  RUE support and right lateral lean Goal status: INITIAL  4.  Pt will demonstrate TUG of </=21 seconds in order to decrease risk of falls and improve functional mobility using LRAD. Baseline: 26.41 seconds w/ NBQC SBA Goal status: INITIAL  5.  Pt will demonstrate a gait speed of >/=0.73 m/sec in order to decrease risk for falls. Baseline: 0.53 m/sec Goal status: INITIAL  LONG TERM GOALS: Target date: 09/23/2022  Patient will be compliant to formal walking program >/= 3 days per week to improve aerobic tolerance and ambulatory  mechanics. Baseline: To be established. Goal status: INITIAL  2.  BERG to be assessed w/ LTG set as appropriate. Baseline: To be assessed. Goal status: INITIAL  3.  Pt will demonstrate TUG of </=16 seconds in order to decrease risk of falls and improve functional mobility using LRAD. Baseline: 26.41 seconds w/ NBQC SBA Goal status: INITIAL  4.  Pt will demonstrate a gait speed of >/=0.93 m/sec in order to decrease risk for falls. Baseline: 0.53 m/sec Goal status: INITIAL  ASSESSMENT:  CLINICAL IMPRESSION: Patient is a 37 y.o. female who was seen today for physical therapy evaluation and treatment for left hemiparesis following SAH.  Pt has a significant PMH of depression, cocaine use, anxiety, right internal carotid artery aneurysm, and mild cognitive impairment with memory loss.  Identified impairments include delayed auditory and written processing, flat affect, variable left LE weakness, left hemiparesis affecting stride and arm swing of gait, and reliance on left posterior Thuasne AFO and NBQC for safety.  Evaluation via the following assessment tools: 5xSTS, , and TUG indicate a high fall risk.  BERG to be assessed at coming session to further assess components contributing to fall risk.  She would benefit from skilled PT to address impairments as noted and progress towards long term goals.  OBJECTIVE IMPAIRMENTS: Abnormal gait, decreased activity tolerance, decreased balance, decreased cognition, decreased endurance, decreased knowledge of condition, decreased mobility, difficulty walking, decreased ROM, decreased strength, impaired sensation, impaired UE functional use, and improper body mechanics.   ACTIVITY LIMITATIONS: carrying, lifting, bending, standing, squatting, stairs, transfers, locomotion level, and caring for others  PARTICIPATION LIMITATIONS: meal prep, cleaning, laundry, medication management, driving, shopping, community activity, and occupation  PERSONAL  FACTORS: Age, Behavior pattern, Past/current experiences, Social background, Transportation, and 1-2 comorbidities: prior drug use, depression/anxiety  are also affecting patient's functional outcome.   REHAB POTENTIAL: Good  CLINICAL DECISION MAKING: Evolving/moderate complexity  EVALUATION COMPLEXITY: Moderate  PLAN:  PT FREQUENCY: 1x/week - pt prefers to use more visits w/ OT  PT DURATION: 8 weeks  PLANNED INTERVENTIONS: Therapeutic exercises, Therapeutic activity, Neuromuscular re-education, Balance training, Gait training, Patient/Family education, Self Care, Stair training, Vestibular training, DME instructions, Manual therapy, and Re-evaluation  PLAN FOR NEXT SESSION: Assess BERG-set goals.  Initiate strength and balance HEP and walking program.  Check all possible CPT codes: 16109 - PT Re-evaluation, 97110- Therapeutic Exercise, [phone]- Neuro Re-education, [phone] - Gait Training, [phone] - Manual Therapy, [phone] - Therapeutic Activities, and [phone] - Self Care    Check all conditions that are expected to impact treatment: Cognitive Impairment or Intellectual disability and Psychological or psychiatric disorders   If treatment provided at initial evaluation, no treatment charged due to lack of authorization.    Sadie Haber, PT, DPT 07/27/2022, 4:16 PM

## 2022-07-27 NOTE — Therapy (Signed)
OUTPATIENT OCCUPATIONAL THERAPY NEURO EVALUATION  Patient Name: Kristin Simmons MRN: [id number] DOB:[birth year], 37 y.o., female Today's Date: 07/27/2022  PCP: Kizzie Furnish D., PA-C REFERRING PROVIDER: Charlton Amor, PA-C  END OF SESSION:  OT End of Session - 07/27/22 1451     Visit Number 1    Number of Visits 12    Date for OT Re-Evaluation 09/23/22    Authorization Type Specialty Services Required - $4 co pay Traditional Medicaid    OT Start Time 1447    OT Stop Time 1530    OT Time Calculation (min) 43 min    Equipment Utilized During Treatment Testing materail    Activity Tolerance Patient tolerated treatment well    Behavior During Therapy Flat affect             Past Medical History:  Diagnosis Date   Anemia    Anxiety    Arthritis    Asthma    Cancer (HCC)    cervical   Depression    Headache    Hypertension    She was taking her mother's blood pressure meds but states she doesn't need bp meds now.   Vaginal Pap smear, abnormal    Past Surgical History:  Procedure Laterality Date   CRANIOTOMY Right 05/31/2022   Procedure: RIGHT PTERIONAL CRANIOTOMY FOR CLIPPING OF MIDDLE CEREBRAL ARTERY  ANEURYSM WITH PLACEMENT OF BONEFLAP IN ABDOMEN;  Surgeon: Lisbeth Renshaw, MD;  Location: [facility] OR;  Service: Neurosurgery;  Laterality: Right;   IR 3D INDEPENDENT WKST  03/08/2022   IR ANGIO INTRA EXTRACRAN SEL INTERNAL CAROTID BILAT MOD SED  03/08/2022   IR ANGIO VERTEBRAL SEL VERTEBRAL BILAT MOD SED  03/08/2022   IR ANGIOGRAM FOLLOW UP STUDY  03/08/2022   IR ANGIOGRAM FOLLOW UP STUDY  03/08/2022   IR ANGIOGRAM FOLLOW UP STUDY  03/08/2022   IR NEURO EACH ADD'L AFTER BASIC UNI RIGHT (MS)  03/08/2022   IR TRANSCATH/EMBOLIZ  03/08/2022   NO PAST SURGERIES     RADIOLOGY WITH ANESTHESIA N/A 03/08/2022   Procedure: Diagnostic angiogram, Possible Coil Embolization of Aneurysm;  Surgeon: Lisbeth Renshaw, MD;  Location: [facility] OR;  Service: Radiology;  Laterality: N/A;   TUBAL  LIGATION Bilateral 03/27/2018   Procedure: POST PARTUM TUBAL LIGATION;  Surgeon: Levie Heritage, DO;  Location: [facility];  Service: Gynecology;  Laterality: Bilateral;   Patient Active Problem List   Diagnosis Date Noted   Mild cognitive impairment with memory loss 07/05/2022   Right internal carotid artery aneurysm 06/21/2022   Brain bleed (HCC) 06/02/2022   Status post craniotomy 05/31/2022   SAH (subarachnoid hemorrhage) (HCC) 05/31/2022   Cerebral aneurysm 03/08/2022   Aneurysm, cerebral, nonruptured 03/08/2022   Other spondylosis with radiculopathy, cervical region 04/15/2021   PROM (premature rupture of membranes) 03/26/2018   Postpartum hypertension 03/26/2018   Trichomoniasis 12/05/2017   High risk social situation  12/05/2017   Supervision of normal pregnancy 10/19/2017   History of cocaine use 10/19/2017   Adjustment disorder with depressed mood 09/13/2017   Anxiety disorder, unspecified 09/12/2017   Abnormal Pap smear of cervix 01/18/2016   Smoker 01/13/2016   Depression 01/13/2016   Alcohol use affecting pregnancy, first trimester 01/13/2016   History of preterm delivery, currently pregnant 01/13/2016   Underweight 01/13/2016    ONSET DATE: Referral 07/12/2022 Onset 05/31/22  REFERRING DIAG: I60.9 (ICD-10-CM) - SAH (subarachnoid hemorrhage)  THERAPY DIAG:  Other lack of coordination  Muscle weakness (generalized)  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 Pt accompanied by: family member Mom - Avie Arenas  PERTINENT HISTORY:   H/O hypertension as well as tobacco and alcohol use.  Patient with trip to the  emergency department in April 2024 for dental pain and underwent maxillofacial CT which revealed a right carotid aneurysm.  She was referred to neurosurgery and returned 05/31/2022 for elective right craniotomy for clipping of middle cerebral artery aneurysm placement of bone flap and subcutaneous abdominal pocket.  Inpatient Rehab: 06/21/2022 - 07/20/2022 Discharge Diagnoses:  Principal Problem:   SAH (subarachnoid hemorrhage) (HCC) Active Problems:   Right internal carotid artery aneurysm   Mild cognitive impairment with memory loss DVT prophylaxis Mood stabilization Hyponatremia Decreased nutritional storage Urinary retention/resolved Tobacco/alcohol use Hypertension  PRECAUTIONS: Fall and Other: Helmet in place d/t sx bone flap removal R skull  WEIGHT BEARING RESTRICTIONS: No  PAIN:  Are you having pain? No  MR review notes: Pain management with use of scheduled baclofen.  She was using Voltaren gel 4 times a day as well as scheduled Neurontin and a Lidoderm patch with close monitoring of mental status.  Tramadol was added 25 mg every 8 hours as needed for headache.  Resting well at night with the addition of trazodone as needed.   FALLS: Has patient fallen in last 6 months? No  LIVING ENVIRONMENT: Lives with: lives with their family - 5 children/daughters (17, 15, 10, 5, 4) Lives in: House/apartment Stairs: Yes: Internal: 14 steps; on left going up Has following equipment at home: Counselling psychologist, Wheelchair (manual), Shower bench, and bed side commode  PLOF: Independent - driving and worked at [employer]  PATIENT GOALS: Wants to be able to use her L hand and arm better ie) to put on her shoes and do her hair.  OBJECTIVE:   HAND DOMINANCE: Right  ADLs: Overall ADLs: Assistance provided at this time from older children Transfers/ambulation related to ADLs: MI with quad cane Eating: Difficulty cutting food - assisted by children Grooming: Daughter is  doing her hair. UB Dressing: Daughter helps with bra but can do a T-shirt LB Dressing: Daughter helps with socks and shoes and brace Toileting: MI with cane and uses BSC at night Bathing: Famiy present for transfer but she bathes herself Tub Shower transfers: supervision Equipment: Transfer tub bench, bed side commode, and gair belt  IADLs: Shopping: Oldest sister Light housekeeping: Daughter Meal Prep: Oldest daughter helps with meal prep Community mobility: Mom is providing transportation Medication management: Daughter Landscape architect: Mom is helping Handwriting:  No difficulty with R UE  MOBILITY STATUS: Needs Assist: quad cane and supervision for safety and difficulty carrying objections with ambulation  POSTURE COMMENTS:  No Significant postural limitations Sitting balance:  WFL in standard armchair  ACTIVITY TOLERANCE: Activity tolerance: Patient   FUNCTIONAL OUTCOME MEASURES: Evaluation 07/27/22 - Quick Dash: 77.3  UPPER EXTREMITY ROM:    Active ROM Right eval Left eval  Shoulder flexion WNL <5 deg  Shoulder abduction  10 deg  Shoulder adduction  10 deg  Shoulder extension  20 deg   Shoulder internal rotation    Shoulder external rotation    Elbow flexion  90 deg  Elbow extension  130 degrees  Wrist flexion  5 deg  Wrist extension  5 deg  Wrist ulnar deviation    Wrist radial deviation    Wrist pronation    Wrist supination    (Blank rows = not tested)  UPPER EXTREMITY MMT:     MMT Right eval Left eval  Shoulder flexion Grossly 4/5 for size 1/5  Shoulder abduction  2-/5  Shoulder adduction  2-/5  Shoulder extension  2/5  Shoulder internal rotation    Shoulder external rotation    Middle trapezius    Lower trapezius    Elbow flexion  2/5  Elbow extension  2-/5  Wrist flexion  2-/5  Wrist extension  2-/5  Wrist ulnar deviation    Wrist radial deviation    Wrist pronation    Wrist supination    (Blank rows = not tested)  HAND  FUNCTION: Grip strength: Right: 55.5, 41.2, 36.8  lbs; Left: 6.6, 4.8, 4.6  lbs  COORDINATION: Impaired - had to put the hand over a block to pick it up - not able to perform the Box an Blocks test  SENSATION: Light touch: Impaired - patient reports tingling and "prickly" feelings in L hand Stereognosis: Slightly Impaired   EDEMA: slight  MUSCLE TONE: LUE: Hypertonic, Flaccid, and shoulder has subluxation due to flaccid and hand is   COGNITION: Overall cognitive status: Impaired and Difficulty to assess due to: Flat affect.  Patient said she had a 35 year old   VISION: Subjective report: No difficulty with vision Baseline vision: Wears glasses for reading only Visual history: NA  VISION ASSESSMENT: Not tested  Patient reports no difficulty with activities due to vision  PERCEPTION: Not tested  PRAXIS: Not tested  OBSERVATIONS: Patient is a young petite female wearing a protective soft helmet due to missing bone flap on the right side of her head who entered therapy using a quad cane.  Patient has limited eye contact throughout session with flat affect but will answer questions about her goals and family.  She did have 1 mistake about family i.e. indicating she had a 66-year-old and when her mother corrected her patient tried to adamantly state she knew her kids.  Patient has obvious left hemiplegia with developing shoulder subluxation without pain at this time.  She was not able to complete tabletop activities with left hand without placing it there.   TODAY'S TREATMENT:          NA                                                                                                                     PATIENT EDUCATION: Education details: OT POC and treatment planning/frequency Person educated: Patient and Parent Education method: Explanation Education comprehension: verbalized understanding and needs further education  HOME EXERCISE PROGRAM: TBD   GOALS: Goals reviewed with  patient? Yes  SHORT TERM GOALS: Target date: 08/26/22  Patient will demonstrate HEP for, AROM and AAROM of left upper extremity  to improve strength and range of motion for functional use of hand with visual cues. Baseline: New hemiplegia with recent inpt rehab Goal status: INITIAL  2.  Patient will demonstrate gross grasp to pick up and move objects on table top Baseline: Unable to perform Box & Blocks assessment Goal status: INITIAL  3.  Patient will demonstrate at least 10 lbs L grip strength 1 of 3 trials, as needed to stabilize soda/other bottle top open loose container.  Baseline: Avg 5.3 lbs Goal status: INITIAL  4.  Patient will demonstrate increased AROM of L shoulder and elbow by 10 Baseline: See table above Goal status: INITIAL  5.  Patient will demonstrate modified techniques for donning her own socks. Baseline: Assisted by daughter Goal status: INITIAL   LONG TERM GOALS: Target date: 09/23/22  Patient will demonstrate use of adaptive equipment to assist with Donning left shoe with AFO. Baseline: Assisted by daughter Goal status: INITIAL  2.  Patient will demonstrate standing balance x 10+ minutes and L UE involvement for simple meal prep activities. Baseline: Assisted by teenage daughter Goal status: INITIAL  3.  Pt will be able to place at least 10 blocks using left hand with completion of Box and Blocks test.  Baseline: Unable to assess Goal status: INITIAL  4.  Patient will demonstrate > 10 lbs L grip strength as needed to open jars and other containers.  Baseline: Avg 5.3 lbs Goal status: INITIAL  5.  Patient will demonstrate AAROM and/or supported LUE and use of AE to help increase participation with hair care. Baseline: Depends on teenage daughter. Goal status: INITIAL  6.  Patient will demonstrate at least 16% improvement with quick Dash score (reporting 60% disability or less) indicating improved functional use of affected extremity.  Baseline:  77.3 Goal status: INITIAL  ASSESSMENT:  CLINICAL IMPRESSION: Patient is a 37 y.o. female who was seen today for occupational therapy evaluation for evaluation of L hemiplegia following subdural hemorrhage, affecting all aspects of ADLs and home management. She is currently relying on a quad cane for mobility and has limited use of left upper extremity for tabletop activities. Patient will benefit from skilled occupational therapy services to increase active range of motion, strength, and coordination of left upper extremity and provide patient and caregiver with education on home based activities for patient to complete daily to progress with maximum level of independence to function at home with her five daughters with decreased assistance from them for her daily routines  PERFORMANCE DEFICITS: in functional skills including ADLs, IADLs, coordination, dexterity, sensation, tone, ROM, strength, muscle spasms, flexibility, Fine motor control, Gross motor control, mobility, balance, endurance, decreased knowledge of use of DME, and UE functional use, cognitive skills including attention, emotional, energy/drive, memory, orientation, problem solving, safety awareness, sequencing, and temperament/personality, and psychosocial skills including coping strategies, interpersonal interactions, and routines and behaviors.   IMPAIRMENTS: are limiting patient from ADLs, IADLs, work, play, leisure, and social participation.   CO-MORBIDITIES: may have co-morbidities  that affects occupational performance. Patient will benefit from skilled OT to address above impairments and improve overall function.  MODIFICATION OR ASSISTANCE TO COMPLETE EVALUATION: Min-Moderate modification of tasks or assist with assess necessary to complete an evaluation.  OT OCCUPATIONAL PROFILE AND HISTORY: Problem focused assessment: Including review of records relating to presenting problem.  CLINICAL DECISION MAKING: Moderate - several  treatment options, min-mod task modification necessary  REHAB POTENTIAL: Good  EVALUATION COMPLEXITY: Moderate    PLAN:  OT FREQUENCY: 1-2x/week  OT  DURATION: 8 weeks  PLANNED INTERVENTIONS: self care/ADL training, therapeutic exercise, therapeutic activity, neuromuscular re-education, manual therapy, passive range of motion, balance training, functional mobility training, splinting, electrical stimulation, patient/family education, cognitive remediation/compensation, visual/perceptual remediation/compensation, coping strategies training, and DME and/or AE instructions  RECOMMENDED OTHER SERVICES: Patient evaluated for ST today with no treatment to be imitated at this tie as she was more interested in OT and PT will also work with her as well.  CONSULTED AND AGREED WITH PLAN OF CARE: Patient and family member/caregiver  PLAN FOR NEXT SESSION: Review goals, Initiate HEP - AAROM, coordination, strength.  Ideas re: simple things to cook.  Does Foot Funnel work to help don shoes with AFO?    Check all possible CPT codes: 16109 - OT Re-evaluation, 97110- Therapeutic Exercise, [phone]- Neuro Re-education, 97140 - Manual Therapy, 97530 - Therapeutic Activities, [phone] - Self Care, [phone] - Electrical stimulation (unattended), [phone] - Electrical stimulation (Manual), [phone] - Orthotic Fit, [id number] - Cognitive training (First 15 min), and 97130 - Cognitive training (each additional 15 min)    Check all conditions that are expected to impact treatment: {Conditions expected to impact treatment:Cognitive Impairment or Intellectual disability, Contractures, spasticity or fracture relevant to requested treatment, and Neurological condition and/or seizures   If treatment provided at initial evaluation, no treatment charged due to lack of authorization.       Victorino Sparrow, OT 07/27/2022, 5:32 PM

## 2022-07-27 NOTE — Therapy (Signed)
OUTPATIENT SPEECH LANGUAGE PATHOLOGY EVALUATION   Patient Name: Kristin Simmons MRN: [id number] DOB:11/12/1985, 37 y.o., female Today's Date: 07/27/2022  PCP: Kizzie Furnish D., PA-CPCP - General  REFERRING PROVIDER: Charlton Amor, PA-C  END OF SESSION:  End of Session - 07/27/22 1322     Visit Number 1    Number of Visits 1    Authorization Type Medicaid    Authorization - Number of Visits 27    SLP Start Time 1400    SLP Stop Time  1430    SLP Time Calculation (min) 30 min    Activity Tolerance Patient tolerated treatment well             Past Medical History:  Diagnosis Date   Anemia    Anxiety    Arthritis    Asthma    Cancer (HCC)    cervical   Depression    Headache    Hypertension    She was taking her mother's blood pressure meds but states she doesn't need bp meds now.   Vaginal Pap smear, abnormal    Past Surgical History:  Procedure Laterality Date   CRANIOTOMY Right 05/31/2022   Procedure: RIGHT PTERIONAL CRANIOTOMY FOR CLIPPING OF MIDDLE CEREBRAL ARTERY  ANEURYSM WITH PLACEMENT OF BONEFLAP IN ABDOMEN;  Surgeon: Lisbeth Renshaw, MD;  Location: [facility] OR;  Service: Neurosurgery;  Laterality: Right;   IR 3D INDEPENDENT WKST  03/08/2022   IR ANGIO INTRA EXTRACRAN SEL INTERNAL CAROTID BILAT MOD SED  03/08/2022   IR ANGIO VERTEBRAL SEL VERTEBRAL BILAT MOD SED  03/08/2022   IR ANGIOGRAM FOLLOW UP STUDY  03/08/2022   IR ANGIOGRAM FOLLOW UP STUDY  03/08/2022   IR ANGIOGRAM FOLLOW UP STUDY  03/08/2022   IR NEURO EACH ADD'L AFTER BASIC UNI RIGHT (MS)  03/08/2022   IR TRANSCATH/EMBOLIZ  03/08/2022   NO PAST SURGERIES     RADIOLOGY WITH ANESTHESIA N/A 03/08/2022   Procedure: Diagnostic angiogram, Possible Coil Embolization of Aneurysm;  Surgeon: Lisbeth Renshaw, MD;  Location: [facility] OR;  Service: Radiology;  Laterality: N/A;   TUBAL LIGATION Bilateral 03/27/2018   Procedure: POST PARTUM TUBAL LIGATION;  Surgeon: Levie Heritage, DO;  Location: [facility];   Service: Gynecology;  Laterality: Bilateral;   Patient Active Problem List   Diagnosis Date Noted   Mild cognitive impairment with memory loss 07/05/2022   Right internal carotid artery aneurysm 06/21/2022   Brain bleed (HCC) 06/02/2022   Status post craniotomy 05/31/2022   SAH (subarachnoid hemorrhage) (HCC) 05/31/2022   Cerebral aneurysm 03/08/2022   Aneurysm, cerebral, nonruptured 03/08/2022   Other spondylosis with radiculopathy, cervical region 04/15/2021   PROM (premature rupture of membranes) 03/26/2018   Postpartum hypertension 03/26/2018   Trichomoniasis 12/05/2017   High risk social situation  12/05/2017   Supervision of normal pregnancy 10/19/2017   History of cocaine use 10/19/2017   Adjustment disorder with depressed mood 09/13/2017   Anxiety disorder, unspecified 09/12/2017   Abnormal Pap smear of cervix 01/18/2016   Smoker 01/13/2016   Depression 01/13/2016   Alcohol use affecting pregnancy, first trimester 01/13/2016   History of preterm delivery, currently pregnant 01/13/2016   Underweight 01/13/2016    ONSET DATE: 05/31/22   REFERRING DIAG:  I60.9 (ICD-10-CM) - SAH (subarachnoid hemorrhage) (HCC)    THERAPY DIAG: Cognitive communication deficit  Rationale for Evaluation and Treatment: Rehabilitation  SUBJECTIVE:   SUBJECTIVE STATEMENT: "You have anything to help my hand." Pt accompanied by: family member  PERTINENT HISTORY: "Kristin  CHEYANNA Simmons is a 37 y.o. female with previous incidental diagnosis of multiple intracranial aneurysms.  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  She was found to harbor an irregular right middle cerebral artery aneurysm at the same time.  She was therefore brought in for elective right middle cerebral artery aneurysm clipping.  Unfortunately during the initial portion of the procedure, she had an intraoperative rupture with severe diffuse brain swelling.  We were able to complete the  procedure and surgically clipped the right middle cerebral artery aneurysm.  Due to the amount of swelling we elected to implant the bone flap in the abdomen.  Patient was taken to the intensive care unit initially extubated.  Postoperatively she was noted to have left hemiplegia, but was arousable.  She began to improve from a level of consciousness standpoint.  She became interactive verbally after extubation.  She worked with physical and Occupational Therapy.  She did have episodes of decreased responsiveness and did undergo long-term EEG monitoring without any evidence for seizures.  She was also monitored with transcranial Doppler and CT angiogram suggesting possibility of proximal right M1 spasm.  She was treated with hyperdynamic therapy and remained stable."  PAIN:  Are you having pain? No  FALLS: Has patient fallen in last 6 months?  No  LIVING ENVIRONMENT: Lives with: lives with their family Lives in: House/apartment  PLOF:  Level of assistance: Needed assistance with ADLs, Needed assistance with IADLS Employment: Other: Unemployed.   PATIENT GOALS: "Get feeling back in hand."  OBJECTIVE:   COGNITION: Overall cognitive status: Within functional limits for tasks assessed Areas of impairment:  Attention: Impaired: Focused, Sustained, Alternating Functional deficits: Mild delayed processing due to alternating attention deficit. Mom and patient denied any overt cognitive concerns.   AUDITORY COMPREHENSION: Overall auditory comprehension: Appears intact YES/NO questions: Appears intact Following directions: Appears intact Conversation: Simple, Complex, and Moderately Complex Interfering components: attention, awareness, pain, and processing speed Effective technique: Subjectively noticed extra processing time  READING COMPREHENSION: Intact  EXPRESSION: verbal  VERBAL EXPRESSION: Level of generative/spontaneous verbalization: word, phrase, sentence, and  conversation Automatic speech: name: intact and social response: delayed, and monotone   Repetition: Appears intact Pragmatics: Impaired: abnormal effect, eye contact, and monotone Comments: Patient presents with delay in verbal expression.  Non-verbal means of communication: N/A  PATIENT REPORTED OUTCOME MEASURES (PROM): Cognitive Function: Patient rating scale and majority rated "never/no difficulty" on most cognitive tasks, infrequently stated difficulty sometimes/rarely with forming thoughts, thinking slow, and concentration.    TODAY'S TREATMENT:                                                                                                                                         07/27/22: Education on how ST helps in rehab after stroke. Patient is able to remember appointments, and has gone back to cooking independently at home. Mom  is currently managing medication and bills reported due to physical function/limitations. Patient complains on losing sleep due to pain. Education given on attention strategies including, taking breaks, and advocating for herself when needing more time.   PATIENT EDUCATION: Education details: see above Person educated: Patient and Parent Education method: Explanation Education comprehension: verbalized understanding  ASSESSMENT:  CLINICAL IMPRESSION: Patient is a 37 y.o. F who was seen today for [facility] in April [facility]24. Patient demonstrated slow processing speed in both written and verbal tasks, however patient not overtly concerned with any cognition challenges at this time, as chief concern is with OT and PT. Patient is currently independently cooking at home and reports no memory/mistakes for this tasks. Mother is currently managing medication and bills due to patient's limited physical ability. ST not recommended at this time due to no overt cognitive/communication concerns identified Patient encouraged to come back to ST in the future if she notices a decline  in cognitive function across daily living tasks.    Gracy Racer, CCC-SLP 07/27/2022, 3:01 PM

## 2022-08-01 ENCOUNTER — Ambulatory Visit: Payer: Medicaid Other | Admitting: Occupational Therapy

## 2022-08-01 NOTE — Therapy (Deleted)
OUTPATIENT OCCUPATIONAL THERAPY NEURO TREATMENT  Patient Name: Kristin Simmons MRN: [id number] DOB:June 08, 1985, 37 y.o., female Today's Date: 07/27/2022  PCP: Kizzie Furnish D., PA-C REFERRING PROVIDER: Charlton Amor, PA-C  END OF SESSION:  OT End of Session - 07/27/22 1451     Visit Number 1    Number of Visits 12    Date for OT Re-Evaluation 09/23/22    Authorization Type Specialty Services Required - $4 co pay Traditional Medicaid    OT Start Time 1447    OT Stop Time 1530    OT Time Calculation (min) 43 min    Equipment Utilized During Treatment Testing materail    Activity Tolerance Patient tolerated treatment well    Behavior During Therapy Flat affect             Past Medical History:  Diagnosis Date   Anemia    Anxiety    Arthritis    Asthma    Cancer (HCC)    cervical   Depression    Headache    Hypertension    She was taking her mother's blood pressure meds but states she doesn't need bp meds now.   Vaginal Pap smear, abnormal    Past Surgical History:  Procedure Laterality Date   CRANIOTOMY Right 05/31/2022   Procedure: RIGHT PTERIONAL CRANIOTOMY FOR CLIPPING OF MIDDLE CEREBRAL ARTERY  ANEURYSM WITH PLACEMENT OF BONEFLAP IN ABDOMEN;  Surgeon: Lisbeth Renshaw, MD;  Location: [facility] OR;  Service: Neurosurgery;  Laterality: Right;   IR 3D INDEPENDENT WKST  03/08/2022   IR ANGIO INTRA EXTRACRAN SEL INTERNAL CAROTID BILAT MOD SED  03/08/2022   IR ANGIO VERTEBRAL SEL VERTEBRAL BILAT MOD SED  03/08/2022   IR ANGIOGRAM FOLLOW UP STUDY  03/08/2022   IR ANGIOGRAM FOLLOW UP STUDY  03/08/2022   IR ANGIOGRAM FOLLOW UP STUDY  03/08/2022   IR NEURO EACH ADD'L AFTER BASIC UNI RIGHT (MS)  03/08/2022   IR TRANSCATH/EMBOLIZ  03/08/2022   NO PAST SURGERIES     RADIOLOGY WITH ANESTHESIA N/A 03/08/2022   Procedure: Diagnostic angiogram, Possible Coil Embolization of Aneurysm;  Surgeon: Lisbeth Renshaw, MD;  Location: [facility] OR;  Service: Radiology;  Laterality: N/A;   TUBAL  LIGATION Bilateral 03/27/2018   Procedure: POST PARTUM TUBAL LIGATION;  Surgeon: Levie Heritage, DO;  Location: [facility];  Service: Gynecology;  Laterality: Bilateral;   Patient Active Problem List   Diagnosis Date Noted   Mild cognitive impairment with memory loss 07/05/2022   Right internal carotid artery aneurysm 06/21/2022   Brain bleed (HCC) 06/02/2022   Status post craniotomy 05/31/2022   SAH (subarachnoid hemorrhage) (HCC) 05/31/2022   Cerebral aneurysm 03/08/2022   Aneurysm, cerebral, nonruptured 03/08/2022   Other spondylosis with radiculopathy, cervical region 04/15/2021   PROM (premature rupture of membranes) 03/26/2018   Postpartum hypertension 03/26/2018   Trichomoniasis 12/05/2017   High risk social situation  12/05/2017   Supervision of normal pregnancy 10/19/2017   History of cocaine use 10/19/2017   Adjustment disorder with depressed mood 09/13/2017   Anxiety disorder, unspecified 09/12/2017   Abnormal Pap smear of cervix 01/18/2016   Smoker 01/13/2016   Depression 01/13/2016   Alcohol use affecting pregnancy, first trimester 01/13/2016   History of preterm delivery, currently pregnant 01/13/2016   Underweight 01/13/2016    ONSET DATE: Referral 07/12/2022 Onset 05/31/22  REFERRING DIAG: I60.9 (ICD-10-CM) - SAH (subarachnoid hemorrhage)  THERAPY DIAG:  Other lack of coordination  Muscle weakness (generalized)  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  Pt accompanied by: family member Mom - Avie Arenas  PERTINENT HISTORY:   H/O hypertension as well as tobacco and alcohol use.  Patient with trip to  the emergency department in April 2024 for dental pain and underwent maxillofacial CT which revealed a right carotid aneurysm.  She was referred to neurosurgery and returned 05/31/2022 for elective right craniotomy for clipping of middle cerebral artery aneurysm placement of bone flap and subcutaneous abdominal pocket.  Inpatient Rehab: 06/21/2022 - 07/20/2022 Discharge Diagnoses:  Principal Problem:   SAH (subarachnoid hemorrhage) (HCC) Active Problems:   Right internal carotid artery aneurysm   Mild cognitive impairment with memory loss DVT prophylaxis Mood stabilization Hyponatremia Decreased nutritional storage Urinary retention/resolved Tobacco/alcohol use Hypertension  PRECAUTIONS: Fall and Other: Helmet in place d/t sx bone flap removal R skull  WEIGHT BEARING RESTRICTIONS: No  PAIN:  Are you having pain? No  MR review notes: Pain management with use of scheduled baclofen.  She was using Voltaren gel 4 times a day as well as scheduled Neurontin and a Lidoderm patch with close monitoring of mental status.  Tramadol was added 25 mg every 8 hours as needed for headache.  Resting well at night with the addition of trazodone as needed.   FALLS: Has patient fallen in last 6 months? No  LIVING ENVIRONMENT: Lives with: lives with their family - 5 children/daughters (17, 15, 10, 5, 4) Lives in: House/apartment Stairs: Yes: Internal: 14 steps; on left going up Has following equipment at home: Counselling psychologist, Wheelchair (manual), Shower bench, and bed side commode  PLOF: Independent - driving and worked at [employer]  PATIENT GOALS: Wants to be able to use her L hand and arm better ie) to put on her shoes and do her hair.  OBJECTIVE:   HAND DOMINANCE: Right  ADLs: Overall ADLs: Assistance provided at this time from older children Transfers/ambulation related to ADLs: MI with quad cane Eating: Difficulty cutting food - assisted by children Grooming: Daughter  is doing her hair. UB Dressing: Daughter helps with bra but can do a T-shirt LB Dressing: Daughter helps with socks and shoes and brace Toileting: MI with cane and uses BSC at night Bathing: Famiy present for transfer but she bathes herself Tub Shower transfers: supervision Equipment: Transfer tub bench, bed side commode, and gair belt  IADLs: Shopping: Oldest sister Light housekeeping: Daughter Meal Prep: Oldest daughter helps with meal prep Community mobility: Mom is providing transportation Medication management: Daughter Landscape architect: Mom is helping Handwriting:  No difficulty with R UE  MOBILITY STATUS: Needs Assist: quad cane and supervision for safety and difficulty carrying objections with ambulation  POSTURE COMMENTS:  No Significant postural limitations Sitting balance:  WFL in standard armchair  ACTIVITY TOLERANCE: Activity tolerance: Patient   FUNCTIONAL OUTCOME MEASURES: Evaluation 07/27/22 - Quick Dash: 77.3  UPPER EXTREMITY ROM:    Active ROM Right eval Left eval  Shoulder flexion WNL <5 deg  Shoulder abduction  10 deg  Shoulder adduction  10 deg  Shoulder extension  20 deg   Shoulder internal rotation    Shoulder external rotation    Elbow flexion  90  deg  Elbow extension  130 degrees  Wrist flexion  5 deg  Wrist extension  5 deg  Wrist ulnar deviation    Wrist radial deviation    Wrist pronation    Wrist supination    (Blank rows = not tested)  UPPER EXTREMITY MMT:     MMT Right eval Left eval  Shoulder flexion Grossly 4/5 for size 1/5  Shoulder abduction  2-/5  Shoulder adduction  2-/5  Shoulder extension  2/5  Shoulder internal rotation    Shoulder external rotation    Middle trapezius    Lower trapezius    Elbow flexion  2/5  Elbow extension  2-/5  Wrist flexion  2-/5  Wrist extension  2-/5  Wrist ulnar deviation    Wrist radial deviation    Wrist pronation    Wrist supination    (Blank rows = not tested)  HAND  FUNCTION: Grip strength: Right: 55.5, 41.2, 36.8  lbs; Left: 6.6, 4.8, 4.6  lbs  COORDINATION: Impaired - had to put the hand over a block to pick it up - not able to perform the Box an Blocks test  SENSATION: Light touch: Impaired - patient reports tingling and "prickly" feelings in L hand Stereognosis: Slightly Impaired   EDEMA: slight  MUSCLE TONE: LUE: Hypertonic, Flaccid, and shoulder has subluxation due to flaccid and hand is   COGNITION: Overall cognitive status: Impaired and Difficulty to assess due to: Flat affect.  Patient said she had a 35 year old   VISION: Subjective report: No difficulty with vision Baseline vision: Wears glasses for reading only Visual history: NA  VISION ASSESSMENT: Not tested  Patient reports no difficulty with activities due to vision  PERCEPTION: Not tested  PRAXIS: Not tested  Evaluation OBSERVATIONS: Patient is a young petite female wearing a protective soft helmet due to missing bone flap on the right side of her head who entered therapy using a quad cane.  Patient has limited eye contact throughout session with flat affect but will answer questions about her goals and family.  She did have 1 mistake about family i.e. indicating she had a 44-year-old and when her mother corrected her patient tried to adamantly state she knew her kids.  Patient has obvious left hemiplegia with developing shoulder subluxation without pain at this time.  She was not able to complete tabletop activities with left hand without placing it there.   TODAY'S TREATMENT:                                                                                                                           ***  PATIENT EDUCATION: *** Education details: OT POC and treatment planning/frequency Person educated: Patient and Parent Education method: Explanation Education comprehension: verbalized understanding and needs further education  HOME EXERCISE PROGRAM: TBD   GOALS: Goals  reviewed with patient? Yes  SHORT TERM GOALS: Target date: 08/26/22  Patient will demonstrate HEP for, AROM and AAROM of left  upper extremity to improve strength and range of motion for functional use of hand with visual cues. Baseline: New hemiplegia with recent inpt rehab Goal status: INITIAL  2.  Patient will demonstrate gross grasp to pick up and move objects on table top Baseline: Unable to perform Box & Blocks assessment Goal status: INITIAL  3.  Patient will demonstrate at least 10 lbs L grip strength 1 of 3 trials, as needed to stabilize soda/other bottle top open loose container.  Baseline: Avg 5.3 lbs Goal status: INITIAL  4.  Patient will demonstrate increased AROM of L shoulder and elbow by 10 Baseline: See table above Goal status: INITIAL  5.  Patient will demonstrate modified techniques for donning her own socks. Baseline: Assisted by daughter Goal status: INITIAL   LONG TERM GOALS: Target date: 09/23/22  Patient will demonstrate use of adaptive equipment to assist with Donning left shoe with AFO. Baseline: Assisted by daughter Goal status: INITIAL  2.  Patient will demonstrate standing balance x 10+ minutes and L UE involvement for simple meal prep activities. Baseline: Assisted by teenage daughter Goal status: INITIAL  3.  Pt will be able to place at least 10 blocks using left hand with completion of Box and Blocks test.  Baseline: Unable to assess Goal status: INITIAL  4.  Patient will demonstrate > 10 lbs L grip strength as needed to open jars and other containers.  Baseline: Avg 5.3 lbs Goal status: INITIAL  5.  Patient will demonstrate AAROM and/or supported LUE and use of AE to help increase participation with hair care. Baseline: Depends on teenage daughter. Goal status: INITIAL  6.  Patient will demonstrate at least 16% improvement with quick Dash score (reporting 60% disability or less) indicating improved functional use of affected extremity.   Baseline: 77.3 Goal status: INITIAL  ASSESSMENT:  CLINICAL IMPRESSION: *** Patient is a 37 y.o. female who was seen today for occupational therapy evaluation for evaluation of L hemiplegia following subdural hemorrhage, affecting all aspects of ADLs and home management. She is currently relying on a quad cane for mobility and has limited use of left upper extremity for tabletop activities. Patient will benefit from skilled occupational therapy services to increase active range of motion, strength, and coordination of left upper extremity and provide patient and caregiver with education on home based activities for patient to complete daily to progress with maximum level of independence to function at home with her five daughters with decreased assistance from them for her daily routines  PERFORMANCE DEFICITS: in functional skills including ADLs, IADLs, coordination, dexterity, sensation, tone, ROM, strength, muscle spasms, flexibility, Fine motor control, Gross motor control, mobility, balance, endurance, decreased knowledge of use of DME, and UE functional use, cognitive skills including attention, emotional, energy/drive, memory, orientation, problem solving, safety awareness, sequencing, and temperament/personality, and psychosocial skills including coping strategies, interpersonal interactions, and routines and behaviors.   IMPAIRMENTS: are limiting patient from ADLs, IADLs, work, play, leisure, and social participation.   CO-MORBIDITIES: may have co-morbidities  that affects occupational performance. Patient will benefit from skilled OT to address above impairments and improve overall function.  REHAB POTENTIAL: Good   PLAN:  OT FREQUENCY: 1-2x/week  OT DURATION: 8 weeks  PLANNED INTERVENTIONS: self care/ADL training, therapeutic exercise, therapeutic activity, neuromuscular re-education, manual therapy, passive range of motion, balance training, functional mobility training, splinting,  electrical stimulation, patient/family education, cognitive remediation/compensation, visual/perceptual remediation/compensation, coping strategies training, and DME and/or AE instructions  RECOMMENDED OTHER SERVICES: Patient evaluated for ST today  with no treatment to be imitated at this tie as she was more interested in OT and PT will also work with her as well.  CONSULTED AND AGREED WITH PLAN OF CARE: Patient and family member/caregiver  PLAN FOR NEXT SESSION: *** Review goals, Initiate HEP - AAROM, coordination, strength.  Ideas re: simple things to cook.  Does Foot Funnel work to help don shoes with AFO?      Victorino Sparrow, OT 07/27/2022, 5:32 PM

## 2022-08-02 ENCOUNTER — Encounter: Payer: Medicaid Other | Admitting: Registered Nurse

## 2022-08-03 ENCOUNTER — Encounter: Payer: Medicaid Other | Attending: Registered Nurse | Admitting: Physical Medicine and Rehabilitation

## 2022-08-04 ENCOUNTER — Telehealth: Payer: Self-pay | Admitting: Occupational Therapy

## 2022-08-04 ENCOUNTER — Ambulatory Visit: Payer: Medicaid Other | Admitting: Physical Therapy

## 2022-08-04 NOTE — Telephone Encounter (Signed)
OTR contacted patient about missed OT visit earlier this week and patient reported that her mom was working.  She was reminded about her PT appointment tomorrow at 8:45 and then OT/PT on Monday, beginning at 8 AM.  Will need to review attendance policy at next appointment.  Marylu Lund L. Judie Grieve, OTR/L

## 2022-08-05 ENCOUNTER — Ambulatory Visit: Payer: Medicaid Other | Admitting: Physical Therapy

## 2022-08-08 ENCOUNTER — Ambulatory Visit: Payer: Medicaid Other | Admitting: Occupational Therapy

## 2022-08-08 ENCOUNTER — Ambulatory Visit: Payer: Medicaid Other | Admitting: Physical Therapy

## 2022-08-12 ENCOUNTER — Ambulatory Visit: Payer: Medicaid Other | Admitting: Physical Therapy

## 2022-08-12 ENCOUNTER — Encounter: Payer: Self-pay | Admitting: Physical Therapy

## 2022-08-12 ENCOUNTER — Telehealth: Payer: Self-pay | Admitting: Physical Therapy

## 2022-08-12 DIAGNOSIS — R278 Other lack of coordination: Secondary | ICD-10-CM

## 2022-08-12 DIAGNOSIS — M6281 Muscle weakness (generalized): Secondary | ICD-10-CM

## 2022-08-12 DIAGNOSIS — R2689 Other abnormalities of gait and mobility: Secondary | ICD-10-CM

## 2022-08-12 DIAGNOSIS — R2681 Unsteadiness on feet: Secondary | ICD-10-CM

## 2022-08-12 DIAGNOSIS — I69054 Hemiplegia and hemiparesis following nontraumatic subarachnoid hemorrhage affecting left non-dominant side: Secondary | ICD-10-CM

## 2022-08-12 NOTE — Therapy (Signed)
[facility] [address] [place], [place], [address] Phone: [phone]   Fax:  [phone]  Patient Details -DISCHARGE SUMMARY Name: Kristin Simmons MRN: [id number] Date of Birth: [birth year] Referring Provider:  No ref. provider found  Encounter Date: 08/12/2022  Patient not seen in clinic due to fourth no show for PT today.  PT attempted contact with patient regarding missed visit, see telephone encounter from 08/12/2022 for further details.  She is being discharged from PT at this time without goal reassessment.  Sadie Haber, PT, DPT 08/12/2022, 10:12 AM  [facility] [address] [place], [place], [address] Phone: [phone]   Fax:  [phone]

## 2022-08-12 NOTE — Telephone Encounter (Signed)
PT called preferred number in patient chart with number unable to be completed as dialed.  Today was her fourth no show from PT and patient and mother have been made aware of no show/cancellation policy with prior phone calls from OT.  Will cancel all PT appts and discharge patient from clinic at this time.  Camille Bal, PT, DPT

## 2022-08-15 ENCOUNTER — Telehealth: Payer: Self-pay | Admitting: Occupational Therapy

## 2022-08-15 ENCOUNTER — Encounter: Payer: Self-pay | Admitting: Occupational Therapy

## 2022-08-15 ENCOUNTER — Ambulatory Visit: Payer: MEDICAID | Attending: Physician Assistant | Admitting: Occupational Therapy

## 2022-08-15 ENCOUNTER — Ambulatory Visit: Payer: MEDICAID | Admitting: Physical Therapy

## 2022-08-15 NOTE — Therapy (Signed)
[facility] [address] [place], [place], [address] Phone: [phone]   Fax:  [phone]  Patient Details  Name: Kristin Simmons MRN: [id number] Date of Birth: 09/07/1985 Referring Provider:  No ref. provider found  Encounter Date: 08/15/2022  Patient has not returned to outpatient clinic since evaluation 07/27/22 and has missed 3 consecutive OT visits.  OT has been in touch with patient and mother previously re: attendance policy and made contact again today.  Spoke with patient who gave permission to call her mother where a message was left about discharge at this time due to multiple missed visits.   She is being discharged from OT at this time without goal reassessment.  Victorino Sparrow, OT 08/15/2022, 8:38 AM  [facility] [address] [place], [place], [address] Phone: [phone]   Fax:  [phone]

## 2022-08-15 NOTE — Telephone Encounter (Signed)
OTR contact patient and left voicemail with mother re: DC from OT/PT due to noncompliance with attendance policy s/p 3rd missed visit today for OT and 4th missed visit for PT on Friday.  Patient gave permission for OT to contact mother and voicemail was left re: possible consideration of Home Health or need for new referral for outpatient with week to week visits to be scheduled due to difficulty with attendance policy.   Discharge note written with no reassessment of goals s/p evaluation only.

## 2022-08-19 ENCOUNTER — Ambulatory Visit: Payer: MEDICAID | Admitting: Physical Therapy

## 2022-08-22 ENCOUNTER — Ambulatory Visit: Payer: MEDICAID | Admitting: Occupational Therapy

## 2022-08-22 ENCOUNTER — Ambulatory Visit: Payer: MEDICAID | Admitting: Physical Therapy

## 2022-08-26 ENCOUNTER — Ambulatory Visit: Payer: MEDICAID | Admitting: Physical Therapy

## 2022-08-29 ENCOUNTER — Ambulatory Visit: Payer: MEDICAID | Admitting: Physical Therapy

## 2022-08-29 ENCOUNTER — Ambulatory Visit: Payer: MEDICAID | Admitting: Occupational Therapy

## 2022-08-30 ENCOUNTER — Other Ambulatory Visit: Payer: Self-pay | Admitting: Neurosurgery

## 2022-09-02 ENCOUNTER — Ambulatory Visit: Payer: MEDICAID | Admitting: Physical Therapy

## 2022-09-05 ENCOUNTER — Ambulatory Visit: Payer: MEDICAID | Admitting: Occupational Therapy

## 2022-09-05 ENCOUNTER — Ambulatory Visit: Payer: MEDICAID | Admitting: Physical Therapy

## 2022-09-05 ENCOUNTER — Other Ambulatory Visit (HOSPITAL_COMMUNITY): Payer: Self-pay | Admitting: Neurosurgery

## 2022-09-05 DIAGNOSIS — I607 Nontraumatic subarachnoid hemorrhage from unspecified intracranial artery: Secondary | ICD-10-CM

## 2022-09-09 ENCOUNTER — Ambulatory Visit: Payer: Medicaid Other | Admitting: Physical Therapy

## 2022-09-09 ENCOUNTER — Other Ambulatory Visit: Payer: Self-pay | Admitting: Neurosurgery

## 2022-09-12 ENCOUNTER — Ambulatory Visit: Payer: Medicaid Other | Admitting: Physical Therapy

## 2022-09-12 ENCOUNTER — Encounter: Payer: Medicaid Other | Admitting: Occupational Therapy

## 2022-09-12 NOTE — Progress Notes (Signed)
Surgical Instructions   Your procedure is scheduled on August 6. Report to [facility] Main Entrance "A" at 5:30 A.M., then check in with the Admitting office. Any questions or running late day of surgery: call [phone]  Questions prior to your surgery date: call [phone], Monday-Friday, 8am-4pm. If you experience any cold or flu symptoms such as cough, fever, chills, shortness of breath, etc. between now and your scheduled surgery, please notify us at the above number.     Remember:  Do not eat or drink anything after midnight the night before your surgery   Take these medicines the morning of surgery with A SIP OF WATER  amLODipine (NORVASC)  Baclofen  gabapentin (NEURONTIN)   May take these medicines IF NEEDED: acetaminophen (TYLENOL)  traMADol (ULTRAM)   One week prior to surgery, STOP taking any Aspirin (unless otherwise instructed by your surgeon) Aleve, Naproxen, Ibuprofen, Motrin, Advil, Goody's, BC's, diclofenac Sodium (VOLTAREN) 1 % GEL,all herbal medications, fish oil, and non-prescription vitamins.                     Do NOT Smoke (Tobacco/Vaping) for 24 hours prior to your procedure.  If you use a CPAP at night, you may bring your mask/headgear for your overnight stay.   You will be asked to remove any contacts, glasses, piercing's, hearing aid's, dentures/partials prior to surgery. Please bring cases for these items if needed.    Patients discharged the day of surgery will not be allowed to drive home, and someone needs to stay with them for 24 hours.  SURGICAL WAITING ROOM VISITATION Patients may have no more than 2 support people in the waiting area - these visitors may rotate.   Pre-op nurse will coordinate an appropriate time for 1 ADULT support person, who may not rotate, to accompany patient in pre-op.  Children under the age of 62 must have an adult with them who is not the patient and must remain in the main waiting area with an adult.  If the  patient needs to stay at the hospital during part of their recovery, the visitor guidelines for inpatient rooms apply.  Please refer to the [facility] website for the visitor guidelines for any additional information.   If you received a COVID test during your pre-op visit  it is requested that you wear a mask when out in public, stay away from anyone that may not be feeling well and notify your surgeon if you develop symptoms. If you have been in contact with anyone that has tested positive in the last 10 days please notify you surgeon.      Pre-operative CHG Bathing Instructions   You can play a key role in reducing the risk of infection after surgery. Your skin needs to be as free of germs as possible. You can reduce the number of germs on your skin by washing with CHG (chlorhexidine gluconate) soap before surgery. CHG is an antiseptic soap that kills germs and continues to kill germs even after washing.   DO NOT use if you have an allergy to chlorhexidine/CHG or antibacterial soaps. If your skin becomes reddened or irritated, stop using the CHG and notify one of our RNs at [phone].              TAKE A SHOWER THE NIGHT BEFORE SURGERY AND THE DAY OF SURGERY    Please keep in mind the following:  DO NOT shave, including legs and underarms, 48 hours prior to surgery.  You may shave your face before/day of surgery.  Place clean sheets on your bed the night before surgery Use a clean washcloth (not used since being washed) for each shower. DO NOT sleep with pet's night before surgery.  CHG Shower Instructions:  If you choose to wash your hair and private area, wash first with your normal shampoo/soap.  After you use shampoo/soap, rinse your hair and body thoroughly to remove shampoo/soap residue.  Turn the water OFF and apply half the bottle of CHG soap to a CLEAN washcloth.  Apply CHG soap ONLY FROM YOUR NECK DOWN TO YOUR TOES (washing for 3-5 minutes)  DO NOT use CHG soap on  face, private areas, open wounds, or sores.  Pay special attention to the area where your surgery is being performed.  If you are having back surgery, having someone wash your back for you may be helpful. Wait 2 minutes after CHG soap is applied, then you may rinse off the CHG soap.  Pat dry with a clean towel  Put on clean pajamas    Additional instructions for the day of surgery: DO NOT APPLY any lotions, deodorants, cologne, or perfumes.   Do not wear jewelry or makeup Do not wear nail polish, gel polish, artificial nails, or any other type of covering on natural nails (fingers and toes) Do not bring valuables to the hospital. [facility] is not responsible for valuables/personal belongings. Put on clean/comfortable clothes.  Please brush your teeth.  Ask your nurse before applying any prescription medications to the skin.

## 2022-09-13 ENCOUNTER — Inpatient Hospital Stay (HOSPITAL_COMMUNITY)
Admission: RE | Admit: 2022-09-13 | Discharge: 2022-09-13 | Disposition: A | Discharge status: Home / Self Care | Payer: MEDICAID | Source: Ambulatory Visit

## 2022-09-13 NOTE — Progress Notes (Signed)
Attempted to call patient at the [phone] number to make sure she was still coming to her 1000 appt.  Call unable to be completed.

## 2022-09-13 NOTE — Progress Notes (Signed)
Pt did not show up for PAT appt. Contacted pt's mom. She said she got the pt a new phone yesterday with a new number. Pt's mother said she would call pt immediately to have her call and reschedule PAT appt. Pt's mother was given Gail's number Museum/gallery conservator).

## 2022-09-14 ENCOUNTER — Encounter (HOSPITAL_COMMUNITY): Payer: Self-pay

## 2022-09-14 ENCOUNTER — Other Ambulatory Visit: Payer: Self-pay

## 2022-09-14 ENCOUNTER — Encounter (HOSPITAL_COMMUNITY)
Admission: RE | Admit: 2022-09-14 | Discharge: 2022-09-14 | Disposition: A | Discharge status: Home / Self Care | Payer: MEDICAID | Source: Ambulatory Visit | Attending: Neurosurgery | Admitting: Neurosurgery

## 2022-09-14 VITALS — BP 138/105 | HR 85 | Temp 98.4°F | Resp 17 | Ht 64.0 in | Wt 109.0 lb

## 2022-09-14 DIAGNOSIS — Z789 Other specified health status: Secondary | ICD-10-CM | POA: Insufficient documentation

## 2022-09-14 DIAGNOSIS — M952 Other acquired deformity of head: Secondary | ICD-10-CM | POA: Insufficient documentation

## 2022-09-14 DIAGNOSIS — I10 Essential (primary) hypertension: Secondary | ICD-10-CM | POA: Diagnosis not present

## 2022-09-14 DIAGNOSIS — Z8709 Personal history of other diseases of the respiratory system: Secondary | ICD-10-CM | POA: Diagnosis not present

## 2022-09-14 DIAGNOSIS — Z8673 Personal history of transient ischemic attack (TIA), and cerebral infarction without residual deficits: Secondary | ICD-10-CM | POA: Insufficient documentation

## 2022-09-14 DIAGNOSIS — Z862 Personal history of diseases of the blood and blood-forming organs and certain disorders involving the immune mechanism: Secondary | ICD-10-CM | POA: Diagnosis not present

## 2022-09-14 DIAGNOSIS — Z01812 Encounter for preprocedural laboratory examination: Secondary | ICD-10-CM | POA: Diagnosis not present

## 2022-09-14 DIAGNOSIS — Z87891 Personal history of nicotine dependence: Secondary | ICD-10-CM | POA: Insufficient documentation

## 2022-09-14 DIAGNOSIS — Z8541 Personal history of malignant neoplasm of cervix uteri: Secondary | ICD-10-CM | POA: Diagnosis not present

## 2022-09-14 DIAGNOSIS — Z01818 Encounter for other preprocedural examination: Secondary | ICD-10-CM

## 2022-09-14 DIAGNOSIS — F1090 Alcohol use, unspecified, uncomplicated: Secondary | ICD-10-CM

## 2022-09-14 NOTE — Progress Notes (Signed)
Anesthesia Chart Review:  Case: [id number] Date/Time: 09/20/22 0715   Procedure: CRANIOPLASTY, HARVEST OF ABDOMINAL BONE FLAP (Right)   Anesthesia type: General   Pre-op diagnosis: ACQUIRED SKULL DEFECT   [facility] ROOM 20 / [facility]   Surgeons: Lisbeth Renshaw, MD       DISCUSSION: Patient is a 37 year old female scheduled for the above procedure. She underwent cerebral angiogram on 03/08/22 showing 6 mm right ophthalmic aneurysm, and irregular 4 mm aneurysm at that origin of the right anterior temporal branch. S/p coil embolization of the right ophthalmic aneurysm 03/08/22. She was scheduled for elective right MCA aneurysm clipping on 05/31/22, but during the initial portion of the procedure, she had an intraoperative rupture with severe diffuse brain swelling. Dr. Conchita Paris was able to complete the procedure and surgically clipped the right RCA aneurysm, but elected to implant the bone flap in the abdomen due to the amount of swelling. She had left hemiplegia post-operatively and worked with PT/OT. Long-term EEG did not show evidence of seizures. Transcranial Doppler and CTA suggested possible proximal right M1 spasm that was treated with hyperdynamic therapy. She required salt tablets and hypertonic saline for hyponatremia which then stabilized. Echocardiogram showed EF 70-75% grade 1 diastolic dysfunction. Patient was eventually extubated and weaned from pressure support and ultimately discharged to [facility] for ongoing rehab 06/21/22 - 07/20/22. Overall she made improvements with primarily 4/5 LUE/LLE movement compared to 5/5 on the right.   History includes smoking, HTN, anemia, asthma, cervical cancer, cerebral aneurysm (s/p coil embolization of RICA/ophthalmic aneurysm 03/08/22; s/p right pterional craniectomy for clipping of MCA aneurysm, complex due to intraoperative rupture and need for temporary MCA occlusion, placement of bone flap in subcutaneous abdominal pocket on 05/31/22), tubal sterilization  (03/27/18). Alcohol use is documented as 14 standard drinks per week.   Prior to her 03/08/22 coil embolization of RICA aneurysm, she was taking her mother's losartan's as needed if she felt her BP was high. (She had previously been on amlodipine 10 mg daily and Maxzide 37.5-25 mg daily.). She is no longer taking any any anti-hypertensives. She actually required [facility] ~ May 2024 hospitalization. BP 138/105 at PAT. She does does not check home BP. She refused further evaluation and labs at PAT. Asked nursing staff to encourage her to follow-up with primary care.   I notified [name] at Dr. Val Riles office of elevated BP readings and patient's refusal to do labs at PAT.    VS: BP (!) 138/105   Pulse 85   Temp 36.9 C   Resp 17   Ht 5\' 4"  (1.626 m)   Wt 49.4 kg   LMP 09/05/2022 (Approximate)   SpO2 100%   BMI 18.71 kg/m    PROVIDERS: Tylene Fantasia., PA-C is PCP. [facility]      LABS: She refused labs at PAT, so will need labs on the day of surgery. Last results in [facility] include: Lab Results  Component Value Date   WBC 6.5 07/18/2022   HGB 11.2 (L) 07/18/2022   HCT 34.1 (L) 07/18/2022   PLT 266 07/18/2022   GLUCOSE 87 07/18/2022   TRIG 116 06/07/2022   ALT 40 07/18/2022   AST 17 07/18/2022   NA 134 (L) 07/18/2022   K 3.6 07/18/2022   CL 105 07/18/2022   CREATININE 0.66 07/18/2022   BUN 9 07/18/2022   CO2 22 07/18/2022   TSH 3.260 06/11/2022   HGBA1C 5.1 06/02/2022    OTHER:  Overnight EEG 06/06/22 - 06/07/22: IMPRESSION: This study is suggestive of cortical dysfunction arising from right hemisphere likely secondary to underlying structural abnormality. No seizures or epileptiform discharges were seen throughout the recording.    IMAGES: CT Head 06/29/22: IMPRESSION: 1. Postoperative changes from prior right pterional decompressive craniectomy with underlying evolving right MCA distribution infarct. Swelling has decreased  since prior, with decreased protrusion of brain parenchyma through the craniectomy defect. Previously seen hyperdense blood products within this region have largely resolved. 2. Residual small volume subdural blood along the falx and tentorium, measuring up to 2-3 mm without significant mass effect. 3. Persistent 8 mm left-to-right shift due to herniation through the craniectomy defect, similar to prior.  CT Orbits 06/29/22: IMPRESSION: 1. Minimal hazy asymmetric stranding within the right intraorbital fat, suspected to reflect a degree of passive venous congestion due to the evolving process within the right cerebral hemisphere. No frank retro-orbital hematoma or other collection. Changes of possible mild and/or early infection would be the primary differential consideration, and correlation with physical exam is recommended. 2. 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 Findings better evaluated on concomitant head CT.  1V PCXR 06/17/22: FINDINGS: The heart size and mediastinal contours are within normal limits. Both lungs are clear. Feeding tube is seen entering stomach. Left internal jugular catheter is unchanged position. The visualized skeletal structures are unremarkable. IMPRESSION: Stable support apparatus. No acute cardiopulmonary abnormality seen.  CTA Head 06/13/22: IMPRESSION: 1. Evolving large right MCA territory infarct with herniation of tissue through the right pterional craniectomy, more pronounced than on prior CT. Residual hyperdense blood products are significantly decreased when compared to prior CT. 2. New area of hypodensity in the right parietal lobe, consistent with recent infarct. 3. 8 mm RIGHTWARD midline shift compared to a 4 mm LEFTWARD midline shift on prior. 4. Severe vasospasm of the supraclinoid right ICA, right A1/ACA and M1/MCA  segments. Distal branches of right MCA are seen herniating through the craniectomy but remain patent.   Diagnostic Cerebral Angiogram with Coil Embolization 03/08/22: IMPRESSION: 1. Successful coil embolization of a 6 mm right ophthalmic aneurysm with minimal neck filling post treatment. 2. Irregular appearing aneurysm at the origin of the right anterior temporal branch measuring approximately 4 mm in maximal dimension.      EKG:  EKG 05/23/22: Normal sinus rhythm RSR prime leads V1 and V2, probably normal variant. Confirmed by Tessa Lerner [phone]) on 05/23/2022 11:47:59 PM   EKG 03/14/21: Sinus rhythm Probable left atrial enlargement RSR' in V1 or V2, probably normal variant Borderline T abnormalities, anterior leads ST elev, probable normal early repol pattern Borderline prolonged QT interval Confirmed by Pricilla Loveless [phone]) on 03/14/2021 4:23:22 PM - Borderline anterior T wave abnormalities in V2-3 appear new, other EKG appears stable when compared to 04/12/14 tracing.      CV:  Transcranial Doppler 06/20/22: Summary:  Mildly elevated left middle cerebral artery mean flow velocities suggest  mild vasospasm. slightly elevated right middle cerebral artery mean flow  velocities of unclear significance.    Echo 06/12/22: IMPRESSIONS   1. Left ventricular ejection fraction, by estimation, is 70 to 75%. The  left ventricle has hyperdynamic function. The left ventricle has no  regional wall motion abnormalities. There is mild concentric left  ventricular hypertrophy. Left ventricular  diastolic parameters are consistent with Grade I diastolic dysfunction  (impaired relaxation).   2. Right ventricular systolic function is hyperdynamic. The right  ventricular size is normal. Tricuspid regurgitation signal is inadequate  for assessing PA pressure.   3. The mitral valve is normal in structure. No evidence of mitral valve  regurgitation. No evidence of mitral stenosis.   4. The  aortic valve is grossly normal. Aortic valve regurgitation is not  visualized. No aortic stenosis is present.   5. The inferior vena cava is normal in size with greater than 50%  respiratory variability, suggesting right atrial pressure of 3 mmHg.  - Conclusion(s)/Recommendation(s): No intracardiac source of embolism  detected on this transthoracic study. Consider a transesophageal  echocardiogram to exclude cardiac source of embolism if clinically  indicated.     Past Medical History:  Diagnosis Date   Anemia    Anxiety    Arthritis    Asthma    Cancer (HCC)    cervical   Depression    Headache    Hypertension    She was taking her mother's blood pressure meds but states she doesn't need bp meds now.   Vaginal Pap smear, abnormal     Past Surgical History:  Procedure Laterality Date   CRANIOTOMY Right 05/31/2022   Procedure: RIGHT PTERIONAL CRANIOTOMY FOR CLIPPING OF MIDDLE CEREBRAL ARTERY  ANEURYSM WITH PLACEMENT OF BONEFLAP IN ABDOMEN;  Surgeon: Lisbeth Renshaw, MD;  Location: [facility];  Service: Neurosurgery;  Laterality: Right;   IR 3D INDEPENDENT WKST  03/08/2022   IR ANGIO INTRA EXTRACRAN SEL INTERNAL CAROTID BILAT MOD SED  03/08/2022   IR ANGIO VERTEBRAL SEL VERTEBRAL BILAT MOD SED  03/08/2022   IR ANGIOGRAM FOLLOW UP STUDY  03/08/2022   IR ANGIOGRAM FOLLOW UP STUDY  03/08/2022   IR ANGIOGRAM FOLLOW UP STUDY  03/08/2022   IR NEURO EACH ADD'L AFTER BASIC UNI RIGHT (MS)  03/08/2022   IR TRANSCATH/EMBOLIZ  03/08/2022   RADIOLOGY WITH ANESTHESIA N/A 03/08/2022   Procedure: Diagnostic angiogram, Possible Coil Embolization of Aneurysm;  Surgeon: Lisbeth Renshaw, MD;  Location: [facility] OR;  Service: Radiology;  Laterality: N/A;   TUBAL LIGATION Bilateral 03/27/2018   Procedure: POST PARTUM TUBAL LIGATION;  Surgeon: Levie Heritage, DO;  [facility]: [facility];  Service: Gynecology;  Laterality: Bilateral;    MEDICATIONS:  acetaminophen (TYLENOL) 325 MG tablet    Lactobacillus (ACIDOPHILUS) CAPS capsule   amLODipine (NORVASC) 10 MG tablet   Baclofen 5 MG TABS   diclofenac Sodium (VOLTAREN) 1 % GEL   folic acid (FOLVITE) 1 MG tablet   gabapentin (NEURONTIN) 300 MG capsule   lidocaine (LIDODERM) 5 %   polyethylene glycol (MIRALAX / GLYCOLAX) 17 g packet   traMADol (ULTRAM) 50 MG tablet   traZODone (DESYREL) 100 MG tablet   No current facility-administered medications for this encounter.    Shonna Chock, PA-C Surgical Short Stay/Anesthesiology [facility] Phone [phone] [facility] Phone [phone] 09/15/2022 11:01 AM

## 2022-09-14 NOTE — Progress Notes (Signed)
PCP - Kizzie Furnish, PA-C Cardiologist - denies  PPM/ICD - denies   Chest x-ray - 06/17/22 EKG - 05/23/22 Stress Test - denies ECHO - 06/12/22 Cardiac Cath - denies  Sleep Study - denies   DM- denies  ASA/Blood Thinner Instructions: n/a   ERAS Protcol - no, NPO   COVID TEST- n/a   Anesthesia review: yes, pt had high BP at PAT appt. Pt states this is how her BP "always is." Pt states she does not take any BP meds and has not taken any BP meds since being discharged after surgery in April. Pt also said she "hates needles" and refused pre-op labs. Revonda Standard, Anesthesia PA, aware of this pt.  Patient denies shortness of breath, fever, cough and chest pain at PAT appointment   All instructions explained to the patient, with a verbal understanding of the material. Patient agrees to go over the instructions while at home for a better understanding.  The opportunity to ask questions was provided.

## 2022-09-14 NOTE — Progress Notes (Signed)
[name], with Dr. Val Riles office, aware of pt's refusal of lab work at [facility].

## 2022-09-15 NOTE — Anesthesia Preprocedure Evaluation (Addendum)
Anesthesia Evaluation  Patient identified by MRN, date of birth, ID band Patient awake    Reviewed: Allergy & Precautions, NPO status , [name] Chart, lab work & pertinent test results  Airway Mallampati: II  TM Distance: >3 FB Neck ROM: Full    Dental no notable dental hx.    Pulmonary asthma , Current Smoker and Patient abstained from smoking.   Pulmonary exam normal        Cardiovascular hypertension, Pt. on medications Normal cardiovascular exam     Neuro/Psych  Headaches PSYCHIATRIC DISORDERS Anxiety Depression     Neuromuscular disease    GI/Hepatic negative GI ROS, Neg liver ROS,,,  Endo/Other  negative endocrine ROS    Renal/GU negative Renal ROS     Musculoskeletal  (+) Arthritis ,    Abdominal   Peds  Hematology negative hematology ROS (+)   Anesthesia Other Findings ACQUIRED SKULL DEFECT  Reproductive/Obstetrics Hcg negative                              Anesthesia Physical Anesthesia Plan  ASA: 2  Anesthesia Plan: General   Post-op Pain Management:    Induction: Intravenous  PONV Risk Score and Plan: 2 and Ondansetron, Dexamethasone and Treatment may vary due to age or medical condition  Airway Management Planned: Oral ETT  Additional Equipment:   Intra-op Plan:   Post-operative Plan: Extubation in OR  Informed Consent: I have reviewed the patients History and Physical, chart, labs and discussed the procedure including the risks, benefits and alternatives for the proposed anesthesia with the patient or authorized representative who has indicated his/her understanding and acceptance.     Dental advisory given  Plan Discussed with: CRNA  Anesthesia Plan Comments: (PAT note written 09/15/2022 by Shonna Chock, PA-C.  )        Anesthesia Quick Evaluation

## 2022-09-16 ENCOUNTER — Ambulatory Visit: Payer: Medicaid Other | Admitting: Physical Therapy

## 2022-09-19 ENCOUNTER — Ambulatory Visit: Payer: Medicaid Other | Admitting: Physical Therapy

## 2022-09-19 ENCOUNTER — Encounter: Payer: Medicaid Other | Admitting: Occupational Therapy

## 2022-09-20 ENCOUNTER — Inpatient Hospital Stay (HOSPITAL_COMMUNITY): Payer: MEDICAID | Admitting: Vascular Surgery

## 2022-09-20 ENCOUNTER — Other Ambulatory Visit: Payer: Self-pay

## 2022-09-20 ENCOUNTER — Encounter (HOSPITAL_COMMUNITY)
Admission: RE | Disposition: A | Discharge status: Home / Self Care | Payer: Self-pay | Source: Home / Self Care | Attending: Neurosurgery

## 2022-09-20 ENCOUNTER — Inpatient Hospital Stay (HOSPITAL_COMMUNITY)
Admission: RE | Admit: 2022-09-20 | Discharge: 2022-09-21 | DRG: 941 | Disposition: A | Discharge status: Home / Self Care | Payer: MEDICAID | Attending: Neurosurgery | Admitting: Neurosurgery

## 2022-09-20 ENCOUNTER — Inpatient Hospital Stay (HOSPITAL_COMMUNITY): Payer: MEDICAID | Admitting: Certified Registered Nurse Anesthetist

## 2022-09-20 ENCOUNTER — Encounter (HOSPITAL_COMMUNITY): Payer: Self-pay | Admitting: Neurosurgery

## 2022-09-20 DIAGNOSIS — Q759 Congenital malformation of skull and face bones, unspecified: Principal | ICD-10-CM

## 2022-09-20 DIAGNOSIS — I10 Essential (primary) hypertension: Secondary | ICD-10-CM | POA: Diagnosis present

## 2022-09-20 DIAGNOSIS — Z9889 Other specified postprocedural states: Secondary | ICD-10-CM

## 2022-09-20 DIAGNOSIS — Z8679 Personal history of other diseases of the circulatory system: Secondary | ICD-10-CM | POA: Diagnosis not present

## 2022-09-20 DIAGNOSIS — J45909 Unspecified asthma, uncomplicated: Secondary | ICD-10-CM | POA: Diagnosis present

## 2022-09-20 DIAGNOSIS — Z48811 Encounter for surgical aftercare following surgery on the nervous system: Secondary | ICD-10-CM | POA: Diagnosis present

## 2022-09-20 DIAGNOSIS — M952 Other acquired deformity of head: Secondary | ICD-10-CM

## 2022-09-20 DIAGNOSIS — F17210 Nicotine dependence, cigarettes, uncomplicated: Secondary | ICD-10-CM | POA: Diagnosis present

## 2022-09-20 DIAGNOSIS — Z79899 Other long term (current) drug therapy: Secondary | ICD-10-CM | POA: Diagnosis not present

## 2022-09-20 HISTORY — PX: CRANIOPLASTY: SHX1407

## 2022-09-20 LAB — CBC
HCT: 35.4 % — ABNORMAL LOW (ref 36.0–46.0)
Hemoglobin: 11.9 g/dL — ABNORMAL LOW (ref 12.0–15.0)
MCH: 29.6 pg (ref 26.0–34.0)
MCHC: 33.6 g/dL (ref 30.0–36.0)
MCV: 88.1 fL (ref 80.0–100.0)
Platelets: 257 10*3/uL (ref 150–400)
RBC: 4.02 MIL/uL (ref 3.87–5.11)
RDW: 13.4 % (ref 11.5–15.5)
WBC: 5.2 10*3/uL (ref 4.0–10.5)
nRBC: 0 % (ref 0.0–0.2)

## 2022-09-20 LAB — POCT PREGNANCY, URINE: Preg Test, Ur: NEGATIVE

## 2022-09-20 LAB — BASIC METABOLIC PANEL
Anion gap: 9 (ref 5–15)
BUN: 6 mg/dL (ref 6–20)
CO2: 23 mmol/L (ref 22–32)
Calcium: 8.9 mg/dL (ref 8.9–10.3)
Chloride: 105 mmol/L (ref 98–111)
Creatinine, Ser: 0.75 mg/dL (ref 0.44–1.00)
GFR, Estimated: >60 mL/min (ref >60)
Glucose, Bld: 91 mg/dL (ref 70–99)
Potassium: 4.3 mmol/L (ref 3.5–5.1)
Sodium: 137 mmol/L (ref 135–145)

## 2022-09-20 LAB — TYPE AND SCREEN
ABO/RH(D): O POS
Antibody Screen: NEGATIVE

## 2022-09-20 LAB — MRSA NEXT GEN BY PCR, NASAL: MRSA by PCR Next Gen: NOT DETECTED

## 2022-09-20 SURGERY — CRANIOPLASTY
Anesthesia: General | Laterality: Right

## 2022-09-20 MED ORDER — ONDANSETRON HCL 4 MG/2ML IJ SOLN
INTRAMUSCULAR | Status: AC
  Filled 2022-09-20: qty 2

## 2022-09-20 MED ORDER — PHENYLEPHRINE 80 MCG/ML (10ML) SYRINGE FOR IV PUSH (FOR BLOOD PRESSURE SUPPORT)
PREFILLED_SYRINGE | INTRAVENOUS | Status: DC | PRN
  Administered 2022-09-20 (×2): 80 ug via INTRAVENOUS

## 2022-09-20 MED ORDER — MIDAZOLAM HCL 2 MG/2ML IJ SOLN
INTRAMUSCULAR | Status: AC
  Filled 2022-09-20: qty 2

## 2022-09-20 MED ORDER — HEMOSTATIC AGENTS (NO CHARGE) OPTIME
TOPICAL | Status: DC | PRN
  Administered 2022-09-20: 1 via TOPICAL

## 2022-09-20 MED ORDER — CLEVIDIPINE BUTYRATE 0.5 MG/ML IV EMUL
INTRAVENOUS | Status: AC
  Filled 2022-09-20: qty 50

## 2022-09-20 MED ORDER — OXYCODONE HCL 5 MG PO TABS: 5.0000 mg | ORAL_TABLET | Freq: Once | ORAL | Status: DC | PRN

## 2022-09-20 MED ORDER — PROPOFOL 1000 MG/100ML IV EMUL
INTRAVENOUS | Status: AC
  Filled 2022-09-20: qty 200

## 2022-09-20 MED ORDER — ESMOLOL HCL 100 MG/10ML IV SOLN
INTRAVENOUS | Status: DC | PRN
Start: 2022-09-20 — End: 2022-09-20
  Administered 2022-09-20 (×2): 50 mg via INTRAVENOUS

## 2022-09-20 MED ORDER — BACITRACIN ZINC 500 UNIT/GM EX OINT
TOPICAL_OINTMENT | CUTANEOUS | Status: AC
  Filled 2022-09-20: qty 28.35

## 2022-09-20 MED ORDER — BUPIVACAINE HCL (PF) 0.5 % IJ SOLN
INTRAMUSCULAR | Status: DC | PRN
  Administered 2022-09-20: 5 mL
  Administered 2022-09-20: 4 mL

## 2022-09-20 MED ORDER — PROPOFOL 10 MG/ML IV BOLUS
INTRAVENOUS | Status: AC
  Filled 2022-09-20: qty 20

## 2022-09-20 MED ORDER — ORAL CARE MOUTH RINSE: 15.0000 mL | Freq: Once | OROMUCOSAL | Status: AC

## 2022-09-20 MED ORDER — ONDANSETRON HCL 4 MG PO TABS: 4.0000 mg | ORAL_TABLET | ORAL | Status: DC | PRN

## 2022-09-20 MED ORDER — LIDOCAINE 2% (20 MG/ML) 5 ML SYRINGE
INTRAMUSCULAR | Status: DC | PRN
  Administered 2022-09-20: 60 mg via INTRAVENOUS

## 2022-09-20 MED ORDER — LABETALOL HCL 5 MG/ML IV SOLN
INTRAVENOUS | Status: DC | PRN
  Administered 2022-09-20: 5 mg via INTRAVENOUS
  Administered 2022-09-20: 10 mg via INTRAVENOUS

## 2022-09-20 MED ORDER — THROMBIN 20000 UNITS EX SOLR
CUTANEOUS | Status: AC
  Filled 2022-09-20: qty 20000

## 2022-09-20 MED ORDER — OXYCODONE HCL 5 MG/5ML PO SOLN: 5.0000 mg | Freq: Once | ORAL | Status: DC | PRN

## 2022-09-20 MED ORDER — MORPHINE SULFATE (PF) 2 MG/ML IV SOLN
1.0000 mg | INTRAVENOUS | Status: DC | PRN
  Administered 2022-09-20 – 2022-09-21 (×4): 2 mg via INTRAVENOUS
  Filled 2022-09-20 (×4): qty 1

## 2022-09-20 MED ORDER — ROCURONIUM BROMIDE 10 MG/ML (PF) SYRINGE
PREFILLED_SYRINGE | INTRAVENOUS | Status: AC
  Filled 2022-09-20: qty 10

## 2022-09-20 MED ORDER — THROMBIN 5000 UNITS EX SOLR
CUTANEOUS | Status: AC
  Filled 2022-09-20: qty 5000

## 2022-09-20 MED ORDER — LIDOCAINE 2% (20 MG/ML) 5 ML SYRINGE
INTRAMUSCULAR | Status: AC
  Filled 2022-09-20: qty 5

## 2022-09-20 MED ORDER — BUPIVACAINE HCL (PF) 0.5 % IJ SOLN
INTRAMUSCULAR | Status: AC
  Filled 2022-09-20: qty 30

## 2022-09-20 MED ORDER — PROMETHAZINE HCL 25 MG/ML IJ SOLN: 6.2500 mg | INTRAMUSCULAR | Status: DC | PRN

## 2022-09-20 MED ORDER — CEFAZOLIN SODIUM-DEXTROSE 2-4 GM/100ML-% IV SOLN
2.0000 g | INTRAVENOUS | Status: AC
  Administered 2022-09-20: 2 g via INTRAVENOUS
  Filled 2022-09-20: qty 100

## 2022-09-20 MED ORDER — THROMBIN 5000 UNITS EX SOLR: OROMUCOSAL | Status: DC | PRN

## 2022-09-20 MED ORDER — DEXAMETHASONE SODIUM PHOSPHATE 10 MG/ML IJ SOLN
INTRAMUSCULAR | Status: AC
  Filled 2022-09-20: qty 1

## 2022-09-20 MED ORDER — CHLORHEXIDINE GLUCONATE 0.12 % MT SOLN
15.0000 mL | Freq: Once | OROMUCOSAL | Status: AC
  Administered 2022-09-20: 15 mL via OROMUCOSAL
  Filled 2022-09-20: qty 15

## 2022-09-20 MED ORDER — FENTANYL CITRATE (PF) 250 MCG/5ML IJ SOLN
INTRAMUSCULAR | Status: DC | PRN
  Administered 2022-09-20: 150 ug via INTRAVENOUS
  Administered 2022-09-20 (×2): 50 ug via INTRAVENOUS

## 2022-09-20 MED ORDER — ONDANSETRON HCL 4 MG/2ML IJ SOLN
INTRAMUSCULAR | Status: DC | PRN
  Administered 2022-09-20: 4 mg via INTRAVENOUS

## 2022-09-20 MED ORDER — FENTANYL CITRATE (PF) 250 MCG/5ML IJ SOLN
INTRAMUSCULAR | Status: AC
  Filled 2022-09-20: qty 5

## 2022-09-20 MED ORDER — CEFAZOLIN SODIUM-DEXTROSE 1-4 GM/50ML-% IV SOLN
1.0000 g | Freq: Three times a day (TID) | INTRAVENOUS | Status: AC
  Administered 2022-09-20 (×2): 1 g via INTRAVENOUS
  Filled 2022-09-20 (×2): qty 50

## 2022-09-20 MED ORDER — ACETAMINOPHEN 650 MG RE SUPP: 650.0000 mg | RECTAL | Status: DC | PRN

## 2022-09-20 MED ORDER — THROMBIN 20000 UNITS EX SOLR: CUTANEOUS | Status: DC | PRN

## 2022-09-20 MED ORDER — SUGAMMADEX SODIUM 200 MG/2ML IV SOLN
INTRAVENOUS | Status: DC | PRN
  Administered 2022-09-20: 98.8 mg via INTRAVENOUS

## 2022-09-20 MED ORDER — DEXMEDETOMIDINE HCL IN NACL 80 MCG/20ML IV SOLN
INTRAVENOUS | Status: DC | PRN
Start: 2022-09-20 — End: 2022-09-20
  Administered 2022-09-20: 10 ug via INTRAVENOUS

## 2022-09-20 MED ORDER — DEXAMETHASONE SODIUM PHOSPHATE 10 MG/ML IJ SOLN
INTRAMUSCULAR | Status: DC | PRN
  Administered 2022-09-20: 10 mg via INTRAVENOUS

## 2022-09-20 MED ORDER — ACETAMINOPHEN 10 MG/ML IV SOLN: 750.0000 mg | Freq: Once | INTRAVENOUS | Status: DC | PRN

## 2022-09-20 MED ORDER — ONDANSETRON HCL 4 MG/2ML IJ SOLN: 4.0000 mg | INTRAMUSCULAR | Status: DC | PRN

## 2022-09-20 MED ORDER — PROPOFOL 10 MG/ML IV BOLUS
INTRAVENOUS | Status: DC | PRN
  Administered 2022-09-20: 150 mg via INTRAVENOUS

## 2022-09-20 MED ORDER — LACTATED RINGERS IV SOLN: INTRAVENOUS | Status: DC

## 2022-09-20 MED ORDER — HYDROCODONE-ACETAMINOPHEN 5-325 MG PO TABS
1.0000 | ORAL_TABLET | ORAL | Status: DC | PRN
  Administered 2022-09-20 – 2022-09-21 (×4): 1 via ORAL
  Filled 2022-09-20 (×5): qty 1

## 2022-09-20 MED ORDER — CHLORHEXIDINE GLUCONATE CLOTH 2 % EX PADS: 6.0000 | MEDICATED_PAD | Freq: Once | CUTANEOUS | Status: DC

## 2022-09-20 MED ORDER — PHENYLEPHRINE HCL-NACL 20-0.9 MG/250ML-% IV SOLN
INTRAVENOUS | Status: DC | PRN
  Administered 2022-09-20: 20 ug/min via INTRAVENOUS

## 2022-09-20 MED ORDER — PANTOPRAZOLE SODIUM 40 MG IV SOLR
40.0000 mg | Freq: Every day | INTRAVENOUS | Status: DC
  Administered 2022-09-20: 40 mg via INTRAVENOUS
  Filled 2022-09-20: qty 10

## 2022-09-20 MED ORDER — MIDAZOLAM HCL 2 MG/2ML IJ SOLN
INTRAMUSCULAR | Status: DC | PRN
  Administered 2022-09-20: 2 mg via INTRAVENOUS

## 2022-09-20 MED ORDER — HYDROMORPHONE HCL 1 MG/ML IJ SOLN
INTRAMUSCULAR | Status: DC | PRN
Start: 2022-09-20 — End: 2022-09-20
  Administered 2022-09-20: .5 mg via INTRAVENOUS

## 2022-09-20 MED ORDER — FENTANYL CITRATE (PF) 100 MCG/2ML IJ SOLN
25.0000 ug | INTRAMUSCULAR | Status: DC | PRN
  Administered 2022-09-20: 25 ug via INTRAVENOUS

## 2022-09-20 MED ORDER — SODIUM CHLORIDE 0.9 % IV SOLN: INTRAVENOUS | Status: DC

## 2022-09-20 MED ORDER — LEVETIRACETAM IN NACL 500 MG/100ML IV SOLN
500.0000 mg | Freq: Two times a day (BID) | INTRAVENOUS | Status: DC
  Administered 2022-09-20 – 2022-09-21 (×3): 500 mg via INTRAVENOUS
  Filled 2022-09-20 (×3): qty 100

## 2022-09-20 MED ORDER — 0.9 % SODIUM CHLORIDE (POUR BTL) OPTIME
TOPICAL | Status: DC | PRN
  Administered 2022-09-20: 2000 mL

## 2022-09-20 MED ORDER — FENTANYL CITRATE (PF) 100 MCG/2ML IJ SOLN
INTRAMUSCULAR | Status: AC
  Filled 2022-09-20: qty 2

## 2022-09-20 MED ORDER — PROMETHAZINE HCL 25 MG PO TABS: 12.5000 mg | ORAL_TABLET | ORAL | Status: DC | PRN

## 2022-09-20 MED ORDER — LABETALOL HCL 5 MG/ML IV SOLN
10.0000 mg | INTRAVENOUS | Status: DC | PRN
  Administered 2022-09-21: 20 mg via INTRAVENOUS
  Administered 2022-09-21 (×2): 40 mg via INTRAVENOUS
  Filled 2022-09-20: qty 4
  Filled 2022-09-20 (×2): qty 8

## 2022-09-20 MED ORDER — ROCURONIUM BROMIDE 10 MG/ML (PF) SYRINGE
PREFILLED_SYRINGE | INTRAVENOUS | Status: DC | PRN
  Administered 2022-09-20 (×2): 20 mg via INTRAVENOUS
  Administered 2022-09-20: 50 mg via INTRAVENOUS

## 2022-09-20 MED ORDER — LIDOCAINE-EPINEPHRINE 1 %-1:100000 IJ SOLN
INTRAMUSCULAR | Status: AC
  Filled 2022-09-20: qty 1

## 2022-09-20 MED ORDER — LIDOCAINE-EPINEPHRINE 1 %-1:100000 IJ SOLN
INTRAMUSCULAR | Status: DC | PRN
  Administered 2022-09-20: 4 mL
  Administered 2022-09-20: 5 mL

## 2022-09-20 MED ORDER — AMISULPRIDE (ANTIEMETIC) 5 MG/2ML IV SOLN: 10.0000 mg | Freq: Once | INTRAVENOUS | Status: DC | PRN

## 2022-09-20 MED ORDER — HYDROMORPHONE HCL 1 MG/ML IJ SOLN
INTRAMUSCULAR | Status: AC
  Filled 2022-09-20: qty 0.5

## 2022-09-20 MED ORDER — PHENYLEPHRINE HCL-NACL 20-0.9 MG/250ML-% IV SOLN
INTRAVENOUS | Status: AC
  Filled 2022-09-20: qty 750

## 2022-09-20 MED ORDER — CHLORHEXIDINE GLUCONATE CLOTH 2 % EX PADS
6.0000 | MEDICATED_PAD | Freq: Every day | CUTANEOUS | Status: DC
  Administered 2022-09-20: 6 via TOPICAL

## 2022-09-20 MED ORDER — ACETAMINOPHEN 325 MG PO TABS
650.0000 mg | ORAL_TABLET | ORAL | Status: DC | PRN
  Administered 2022-09-20: 650 mg via ORAL
  Filled 2022-09-20 (×2): qty 2

## 2022-09-20 MED ORDER — PHENYLEPHRINE 80 MCG/ML (10ML) SYRINGE FOR IV PUSH (FOR BLOOD PRESSURE SUPPORT)
PREFILLED_SYRINGE | INTRAVENOUS | Status: AC
  Filled 2022-09-20: qty 10

## 2022-09-20 MED ORDER — GABAPENTIN 300 MG PO CAPS
600.0000 mg | ORAL_CAPSULE | Freq: Every day | ORAL | Status: DC
  Administered 2022-09-20: 600 mg via ORAL
  Filled 2022-09-20: qty 2

## 2022-09-20 SURGICAL SUPPLY — 66 items
APL SKNCLS STERI-STRIP NONHPOA (GAUZE/BANDAGES/DRESSINGS)
BAG COUNTER SPONGE SURGICOUNT (BAG) ×1 IMPLANT
BAG SPNG CNTER NS LX DISP (BAG) ×1
BENZOIN TINCTURE PRP APPL 2/3 (GAUZE/BANDAGES/DRESSINGS) IMPLANT
BLADE CLIPPER SURG (BLADE) ×1 IMPLANT
BNDG CMPR 75X41 PLY HI ABS (GAUZE/BANDAGES/DRESSINGS) ×1
BNDG GAUZE DERMACEA FLUFF 4 (GAUZE/BANDAGES/DRESSINGS) IMPLANT
BNDG GZE DERMACEA 4 6PLY (GAUZE/BANDAGES/DRESSINGS) ×2
BNDG STRETCH 4X75 STRL LF (GAUZE/BANDAGES/DRESSINGS) IMPLANT
BUR MATCHSTICK NEURO 3.0 LAGG (BURR) IMPLANT
BUR SPIRAL ROUTER 2.3 (BUR) IMPLANT
CANISTER SUCT 3000ML PPV (MISCELLANEOUS) ×1 IMPLANT
CLIP TI MEDIUM 6 (CLIP) IMPLANT
DRAPE NEUROLOGICAL W/INCISE (DRAPES) ×1 IMPLANT
DRAPE SURG 17X23 STRL (DRAPES) IMPLANT
DRAPE WARM FLUID 44X44 (DRAPES) ×1 IMPLANT
DRSG OPSITE POSTOP 4X6 (GAUZE/BANDAGES/DRESSINGS) IMPLANT
DRSG TELFA 3X8 NADH STRL (GAUZE/BANDAGES/DRESSINGS) IMPLANT
DURAPREP 6ML APPLICATOR 50/CS (WOUND CARE) ×1 IMPLANT
ELECT REM PT RETURN 9FT ADLT (ELECTROSURGICAL) ×1
ELECTRODE REM PT RTRN 9FT ADLT (ELECTROSURGICAL) ×1 IMPLANT
FORCEPS BIPOLAR SPETZLER 8 1.0 (NEUROSURGERY SUPPLIES) IMPLANT
GAUZE 4X4 16PLY ~~LOC~~+RFID DBL (SPONGE) IMPLANT
GAUZE SPONGE 4X4 12PLY STRL (GAUZE/BANDAGES/DRESSINGS) ×1 IMPLANT
GLOVE BIO SURGEON STRL SZ7 (GLOVE) IMPLANT
GLOVE BIOGEL PI IND STRL 7.0 (GLOVE) IMPLANT
GLOVE BIOGEL PI IND STRL 7.5 (GLOVE) ×1 IMPLANT
GLOVE ECLIPSE 7.0 STRL STRAW (GLOVE) ×2 IMPLANT
GLOVE EXAM NITRILE XL STR (GLOVE) IMPLANT
GOWN STRL REUS W/ TWL LRG LVL3 (GOWN DISPOSABLE) ×2 IMPLANT
GOWN STRL REUS W/ TWL XL LVL3 (GOWN DISPOSABLE) IMPLANT
GOWN STRL REUS W/TWL 2XL LVL3 (GOWN DISPOSABLE) IMPLANT
GOWN STRL REUS W/TWL LRG LVL3 (GOWN DISPOSABLE) ×2
GOWN STRL REUS W/TWL XL LVL3 (GOWN DISPOSABLE)
HEMOSTAT POWDER KIT SURGIFOAM (HEMOSTASIS) IMPLANT
HEMOSTAT SURGICEL 2X14 (HEMOSTASIS) IMPLANT
KIT BASIN OR (CUSTOM PROCEDURE TRAY) ×1 IMPLANT
KIT TURNOVER KIT B (KITS) ×1 IMPLANT
NDL HYPO 25X1 1.5 SAFETY (NEEDLE) ×1 IMPLANT
NEEDLE HYPO 25X1 1.5 SAFETY (NEEDLE) ×1 IMPLANT
NS IRRIG 1000ML POUR BTL (IV SOLUTION) ×1 IMPLANT
PACK BATTERY CMF DISP FOR DVR (ORTHOPEDIC DISPOSABLE SUPPLIES) IMPLANT
PACK CRANIOTOMY CUSTOM (CUSTOM PROCEDURE TRAY) ×1 IMPLANT
PATTIES SURGICAL .5 X.5 (GAUZE/BANDAGES/DRESSINGS) IMPLANT
PATTIES SURGICAL .5 X3 (DISPOSABLE) IMPLANT
PATTIES SURGICAL 1X1 (DISPOSABLE) IMPLANT
PLATE CRANIAL 4H UNI NEURO III (Plate) IMPLANT
SCREW UNIII AXS SD 1.5X4 (Screw) IMPLANT
SPONGE NEURO XRAY DETECT 1X3 (DISPOSABLE) IMPLANT
SPONGE SURGIFOAM ABS GEL 100 (HEMOSTASIS) ×1 IMPLANT
STAPLER VISISTAT 35W (STAPLE) ×1 IMPLANT
STOCKINETTE 6 STRL (DRAPES) ×1 IMPLANT
SUT ETHILON 3 0 FSL (SUTURE) IMPLANT
SUT ETHILON 3 0 PS 1 (SUTURE) IMPLANT
SUT NURALON 4 0 TR CR/8 (SUTURE) ×3 IMPLANT
SUT STEEL 0 (SUTURE)
SUT STEEL 0 18XMFL TIE 17 (SUTURE) IMPLANT
SUT VIC AB 0 CT1 18XCR BRD8 (SUTURE) ×2 IMPLANT
SUT VIC AB 0 CT1 8-18 (SUTURE) ×3
SUT VIC AB 3-0 SH 8-18 (SUTURE) ×2 IMPLANT
TOWEL GREEN STERILE (TOWEL DISPOSABLE) ×1 IMPLANT
TOWEL GREEN STERILE FF (TOWEL DISPOSABLE) ×1 IMPLANT
TRAY FOLEY MTR SLVR 16FR STAT (SET/KITS/TRAYS/PACK) ×1 IMPLANT
TUBE CONNECTING 12X1/4 (SUCTIONS) ×1 IMPLANT
UNDERPAD 30X36 HEAVY ABSORB (UNDERPADS AND DIAPERS) ×1 IMPLANT
WATER STERILE IRR 1000ML POUR (IV SOLUTION) ×3 IMPLANT

## 2022-09-20 NOTE — Anesthesia Procedure Notes (Signed)
Procedure Name: Intubation Date/Time: 09/20/2022 7:46 AM  Performed by: Owens Loffler, RNPre-anesthesia Checklist: Patient identified, Emergency Drugs available, Suction available, Patient being monitored and Timeout performed Patient Re-evaluated:Patient Re-evaluated prior to induction Oxygen Delivery Method: Circle system utilized Preoxygenation: Pre-oxygenation with 100% oxygen Induction Type: IV induction Ventilation: Mask ventilation without difficulty Laryngoscope Size: Mac and 3 Grade View: Grade I Tube type: Oral Tube size: 7.0 mm Number of attempts: 1 Airway Equipment and Method: Stylet Placement Confirmation: ETT inserted through vocal cords under direct vision, positive ETCO2, CO2 detector and breath sounds checked- equal and bilateral Secured at: 22 cm Tube secured with: Tape Dental Injury: Teeth and Oropharynx as per pre-operative assessment

## 2022-09-20 NOTE — Transfer of Care (Signed)
Immediate Anesthesia Transfer of Care Note  Patient: Kristin Simmons  Procedure(s) Performed: Rebecka Apley, HARVEST OF ABDOMINAL BONE FLAP (Right)  Patient Location: PACU  Anesthesia Type:General  Level of Consciousness: drowsy and patient cooperative  Airway & Oxygen Therapy: Patient Spontanous Breathing and Patient connected to face mask oxygen  Post-op Assessment: Report given to RN and Post -op Vital signs reviewed and stable  Post vital signs: Reviewed and stable  Last Vitals:  Vitals Value Taken Time  BP 148/99 09/20/22 1002  Temp    Pulse 64 09/20/22 1007  Resp 16 09/20/22 1007  SpO2 100 % 09/20/22 1007  Vitals shown include unfiled device data.  Last Pain:  Vitals:   09/20/22 0622  TempSrc: Oral  PainSc:       Patients Stated Pain Goal: 0 (09/20/22 2841)  Complications: There were no known notable events for this encounter.

## 2022-09-20 NOTE — Op Note (Signed)
  NEUROSURGERY OPERATIVE NOTE   PREOP DIAGNOSIS:  Cranial defect   POSTOP DIAGNOSIS: Same  PROCEDURE: Right pterional cranioplasty Harvest of subcutaneous abdominal bone flap  SURGEON: Dr. Lisbeth Renshaw, MD  ASSISTANT: None  ANESTHESIA: General Endotracheal  EBL: 100cc  SPECIMENS: Non  DRAINS: None  COMPLICATIONS: None immediate  CONDITION: Hemodynamically stable to PACU  HISTORY: Kristin Simmons is a 37 y.o. female with a history of middle cerebral artery aneurysm who underwent surgical clipping complicated by intraoperative rupture requiring craniectomy and placement of bone flap into the abdomen.  Patient has made a reasonable neurologic recovery and comes in today for cranioplasty.  The risks, benefits, and alternatives to surgery were all reviewed in detail with the patient and her family.  After all questions were answered informed consent was obtained and witnessed.  PROCEDURE IN DETAIL: The patient was brought to the operating room. After induction of general anesthesia, the patient was positioned on the operative table in the supine position. All pressure points were meticulously padded. Previous Skin incisions over the right scalp and right lower quadrant then marked out and prepped and draped in the usual sterile fashion.  After timeout was conducted, the cranial incision was opened sharply.  Bovie electrocautery was used to dissect through the galea and the temporalis muscle.  The edges of the craniectomy defect were identified.  Dissection was then carried out to identify a plane between the dural substitute previously placed and the galea.  In this way, the craniectomy defect was circumferentially dissected.  The abdominal incision was then also opened sharply and the subcutaneous bone flap was identified and explanted.  Hemostasis was secured on the brain surface using a combination of morselized Gelfoam with thrombin and bipolar electrocautery.  The bone flap  was then plated with standard titanium plates and screws.  It was then placed over the craniectomy defect and secured with more screws.  The wounds were then irrigated with normal saline.  No active bleeding was identified.  The temporalis muscle was then reapproximated with interrupted 0 Vicryl stitches.  The galea was reapproximated with interrupted 0 and 3-0 Vicryl stitches and the skin was closed with staples.  Abdominal wound was also closed in layers with interrupted 0 Vicryl stitches.  Wounds then closed with staples and headwrap applied.  At the end of the case all sponge, needle, and instrument counts were correct. The patient was then transferred to the stretcher, extubated, and taken to the post-anesthesia care unit in stable hemodynamic condition.  Lisbeth Renshaw, MD [facility]

## 2022-09-20 NOTE — H&P (Signed)
Chief Complaint   No chief complaint on file.  History of Present Illness  ALEXA FAVELA is a 37 y.o. female seen in follow-up. Briefly, she is now approximately three months status post right middle cerebral artery aneurysm clipping complicated by early intraoperative rupture and subsequent left-sided weakness. She is actually made an excellent recovery, walking with a cane, with some residual movement in the left arm. She does not really report any significant headaches at this point. She does not report any further "swelling" around the craniectomy defect. Past Medical History  Past Medical History:  Diagnosis Date  . Anemia   . Anxiety   . Arthritis   . Asthma   . Cancer (HCC)    cervical  . Depression   . Headache   . Hypertension    She was taking her mother's blood pressure meds but states she doesn't need bp meds now.  . Vaginal Pap smear, abnormal     Past Surgical History  Past Surgical History:  Procedure Laterality Date  . CRANIOTOMY Right 05/31/2022   Procedure: RIGHT PTERIONAL CRANIOTOMY FOR CLIPPING OF MIDDLE CEREBRAL ARTERY  ANEURYSM WITH PLACEMENT OF BONEFLAP IN ABDOMEN;  Surgeon: Lisbeth Renshaw, MD;  Location: [facility];  Service: Neurosurgery;  Laterality: Right;  . IR 3D INDEPENDENT WKST  03/08/2022  . IR ANGIO INTRA EXTRACRAN SEL INTERNAL CAROTID BILAT MOD SED  03/08/2022  . IR ANGIO VERTEBRAL SEL VERTEBRAL BILAT MOD SED  03/08/2022  . IR ANGIOGRAM FOLLOW UP STUDY  03/08/2022  . IR ANGIOGRAM FOLLOW UP STUDY  03/08/2022  . IR ANGIOGRAM FOLLOW UP STUDY  03/08/2022  . IR NEURO EACH ADD'L AFTER BASIC UNI RIGHT (MS)  03/08/2022  . IR TRANSCATH/EMBOLIZ  03/08/2022  . RADIOLOGY WITH ANESTHESIA N/A 03/08/2022   Procedure: Diagnostic angiogram, Possible Coil Embolization of Aneurysm;  Surgeon: Lisbeth Renshaw, MD;  Location: [facility] OR;  Service: Radiology;  Laterality: N/A;  . TUBAL LIGATION Bilateral 03/27/2018   Procedure: POST PARTUM TUBAL LIGATION;  Surgeon:  Levie Heritage, DO;  [facility]: [facility];  Service: Gynecology;  Laterality: Bilateral;    Social History  Social History   Tobacco Use  . Smoking status: Every Day    Current packs/day: 0.50    Average packs/day: 0.5 packs/day for 15.0 years (7.5 ttl pk-yrs)    Types: Cigarettes  . Smokeless tobacco: Never  Vaping Use  . Vaping status: Some Days  . Substances: Nicotine  Substance Use Topics  . Alcohol use: Yes    Alcohol/week: 14.0 standard drinks of alcohol    Types: 14 Cans of beer per week    Comment: 2 beers daily (malt liquor)  . Drug use: Not Currently    Medications   Prior to Admission medications   Medication Sig Start Date End Date Taking? Authorizing Provider  acetaminophen (TYLENOL) 325 MG tablet Take 2 tablets (650 mg total) by mouth every 4 (four) hours as needed for mild pain (temp > 100.5). 03/09/22  Yes Lisbeth Renshaw, MD  gabapentin (NEURONTIN) 300 MG capsule Take 1 capsule (300 mg) by mouth twice daily and 2 capsules (600 mg) nightly. Patient taking differently: Take 600 mg by mouth at bedtime. 07/19/22  Yes Angiulli, Mcarthur Rossetti, PA-C  traMADol (ULTRAM) 50 MG tablet Take 0.5 tablets (25 mg total) by mouth every 8 (eight) hours as needed (severe headache). Patient taking differently: Take 50 mg by mouth at bedtime. 07/19/22  Yes Angiulli, Mcarthur Rossetti, PA-C  Lactobacillus (ACIDOPHILUS) CAPS capsule Take 1 capsule  by mouth with breakfast, with lunch, and with evening meal. Patient not taking: Reported on 09/14/2022 07/19/22   Angiulli, Mcarthur Rossetti, PA-C  amLODipine (NORVASC) 10 MG tablet Take 1 tablet (10 mg total) by mouth daily. Patient not taking: Reported on 09/14/2022 07/19/22   Angiulli, Mcarthur Rossetti, PA-C  Baclofen 5 MG TABS Take 1 tablet (5 mg total) by mouth 2 (two) times daily. Patient not taking: Reported on 09/14/2022 07/19/22   Angiulli, Mcarthur Rossetti, PA-C  diclofenac Sodium (VOLTAREN) 1 % GEL Apply 2 g topically 4 (four) times daily. Patient not taking: Reported  on 09/14/2022 07/19/22   Angiulli, Mcarthur Rossetti, PA-C  folic acid (FOLVITE) 1 MG tablet Take 1 tablet (1 mg total) by mouth daily. Patient not taking: Reported on 09/14/2022 07/19/22   Angiulli, Mcarthur Rossetti, PA-C  lidocaine (LIDODERM) 5 % Place 3 patches onto the skin daily. Patient not taking: Reported on 09/14/2022 07/19/22   Angiulli, Mcarthur Rossetti, PA-C  polyethylene glycol (MIRALAX / GLYCOLAX) 17 g packet Take 17 g by mouth daily. Patient not taking: Reported on 09/14/2022 07/19/22   Angiulli, Mcarthur Rossetti, PA-C  traZODone (DESYREL) 100 MG tablet Take 1 tablet (100 mg total) by mouth at bedtime as needed for sleep. Patient not taking: Reported on 09/14/2022 07/19/22   Angiulli, Mcarthur Rossetti, PA-C    Allergies  Allergies  Allergen Reactions  . Latex Itching and Rash    Review of Systems  ROS  Neurologic Exam  Awake, alert, oriented Memory and concentration grossly intact Speech fluent, appropriate CN grossly intact Motor exam: Upper Extremities Deltoid Bicep Tricep Grip  Right 5/5 5/5 5/5 5/5  Left 5/5 5/5 5/5 5/5   Lower Extremities IP Quad PF DF EHL  Right 5/5 5/5 5/5 5/5 5/5  Left 5/5 5/5 5/5 5/5 5/5   Sensation grossly intact to LT  Imaging   Impression  - 38 y.o. female s/p right MCA clipping/craniectomy who has made a reasonable neurological recovery now presenting for cranioplasty  Plan  - proceed with right cranioplasty with harvesting of abdominal bone flap.  I have reviewed the procedure and associated risks/benefits/alternatives with the patient in the office. All questions were answered and consent was obtained.

## 2022-09-20 NOTE — Anesthesia Postprocedure Evaluation (Signed)
Anesthesia Post Note  Patient: Kristin Simmons  Procedure(s) Performed: CRANIOPLASTY, HARVEST OF ABDOMINAL BONE FLAP (Right)     Patient location during evaluation: PACU Anesthesia Type: General Level of consciousness: awake Pain management: pain level controlled Vital Signs Assessment: post-procedure vital signs reviewed and stable Respiratory status: spontaneous breathing, nonlabored ventilation and respiratory function stable Cardiovascular status: blood pressure returned to baseline and stable Postop Assessment: no apparent nausea or vomiting Anesthetic complications: no   There were no known notable events for this encounter.  Last Vitals:  Vitals:   09/20/22 1530 09/20/22 1600  BP: (!) 144/96   Pulse: 67   Resp: 15   Temp:  (!) 36.1 C  SpO2: 98%     Last Pain:  Vitals:   09/20/22 1600  TempSrc: Axillary  PainSc:                  Catheryn Bacon 

## 2022-09-21 MED ORDER — HYDROCODONE-ACETAMINOPHEN 5-325 MG PO TABS: 1.0000 | ORAL_TABLET | Freq: Four times a day (QID) | ORAL | 0 refills | Status: AC | PRN

## 2022-09-21 NOTE — Progress Notes (Signed)
  NEUROSURGERY PROGRESS NOTE   No issues overnight. Mild HA. No other complaints.  EXAM:  BP (!) 167/99   Pulse 80   Temp 97.6 F (36.4 C) (Axillary)   Resp (!) 21   Ht 5\' 4"  (1.626 m)   Wt 49.4 kg   LMP 09/05/2022 (Approximate)   SpO2 98%   BMI 18.71 kg/m   Awake, alert, oriented  Speech fluent, appropriate  CN grossly intact  Stable mild left hemiparesis  IMPRESSION:  37 y.o. female POD#1 right cranioplasty, doing well  PLAN: - d/c home today   Lisbeth Renshaw, MD [facility]

## 2022-09-21 NOTE — Discharge Summary (Signed)
Physician Discharge Summary  Patient ID: Kristin Simmons MRN: [id number] DOB/AGE: [birth year] 37 y.o.  Admit date: 09/20/2022 Discharge date: 09/21/2022  Admission Diagnoses:  S/p craniectomy  Discharge Diagnoses:  Same Principal Problem:   Cranial anomaly Active Problems:   Status post craniectomy   Discharged Condition: Stable  Hospital Course:  KYNSLEY Simmons is a 37 y.o. female admitted after elective right cranioplasty.  Patient was at neurologic baseline on postoperative day #1.  She was tolerating diet, voiding normally.  She was therefore discharged home in stable condition.  Treatments: Surgery -right cranioplasty  Discharge Exam: Blood pressure (!) 167/99, pulse 80, temperature 97.6 F (36.4 C), temperature source Axillary, resp. rate (!) 21, height 5\' 4"  (1.626 m), weight 49.4 kg, last menstrual period 09/05/2022, SpO2 98%. Awake, alert, oriented Speech fluent, appropriate CN grossly intact Stable left hemiparesis Wound c/d/i  Disposition: Discharge disposition: 01-Home or Self Care       Discharge Instructions     Call MD for:  redness, tenderness, or signs of infection (pain, swelling, redness, odor or green/yellow discharge around incision site)   Complete by: As directed    Call MD for:  temperature >100.4   Complete by: As directed    Diet - low sodium heart healthy   Complete by: As directed    Discharge instructions   Complete by: As directed    Walk at home as much as possible, at least 4 times / day   Increase activity slowly   Complete by: As directed    Lifting restrictions   Complete by: As directed    No lifting > 10 lbs   May shower / Bathe   Complete by: As directed    48 hours after surgery   May walk up steps   Complete by: As directed    Other Restrictions   Complete by: As directed    No bending/twisting at waist   Remove dressing in 24 hours   Complete by: As directed       Allergies as of 09/21/2022       Reactions    Latex Itching, Rash        Medication List     STOP taking these medications    traMADol 50 MG tablet Commonly known as: ULTRAM       TAKE these medications    acetaminophen 325 MG tablet Commonly known as: TYLENOL Take 2 tablets (650 mg total) by mouth every 4 (four) hours as needed for mild pain (temp > 100.5).   Acidophilus Caps capsule Take 1 capsule by mouth with breakfast, with lunch, and with evening meal.   amLODipine 10 MG tablet Commonly known as: NORVASC Take 1 tablet (10 mg total) by mouth daily.   Baclofen 5 MG Tabs Take 1 tablet (5 mg total) by mouth 2 (two) times daily.   diclofenac Sodium 1 % Gel Commonly known as: VOLTAREN Apply 2 g topically 4 (four) times daily.   folic acid 1 MG tablet Commonly known as: FOLVITE Take 1 tablet (1 mg total) by mouth daily.   gabapentin 300 MG capsule Commonly known as: NEURONTIN Take 1 capsule (300 mg) by mouth twice daily and 2 capsules (600 mg) nightly. What changed:  how much to take how to take this when to take this additional instructions   HYDROcodone-acetaminophen 5-325 MG tablet Commonly known as: NORCO/VICODIN Take 1 tablet by mouth every 6 (six) hours as needed for up to 5 days for moderate pain.  lidocaine 5 % Commonly known as: LIDODERM Place 3 patches onto the skin daily.   polyethylene glycol 17 g packet Commonly known as: MIRALAX / GLYCOLAX Take 17 g by mouth daily.   traZODone 100 MG tablet Commonly known as: DESYREL Take 1 tablet (100 mg total) by mouth at bedtime as needed for sleep.        Follow-up Information     Lisbeth Renshaw, MD Follow up in 2 week(s).   Specialty: Neurosurgery Contact information: [address] [place] [address] [phone]                 Signed: Jackelyn Hoehn 09/21/2022, 10:16 AM

## 2022-09-21 NOTE — Discharge Instructions (Signed)
Patient education given on follow up appointment and wound care and the patient expresses understanding and acceptance of instructions. ,  C 09/21/2022 11:43 AM

## 2022-09-21 NOTE — Progress Notes (Signed)
Patient not responding to labetalol, BP reamins in 160s. Dr. Conchita Paris aware, ok with BP <180.

## 2022-09-21 NOTE — TOC Transition Note (Signed)
Transition of Care [facility]) - CM/SW Discharge Note   Patient Details  Name: Kristin Simmons MRN: [id number] Date of Birth: 1985/04/19  [facility] CM/SW Contact:  Mearl Latin, LCSW Phone Number: 09/21/2022, 10:58 AM   Clinical Narrative:    Patient ready for discharge home today. No TOC needs identified.    Final next level of care: Home/Self Care Barriers to Discharge: No Barriers Identified   Patient Goals and CMS Choice      Discharge Placement                         Discharge Plan and Services Additional resources added to the After Visit Summary for                                       Social Determinants of Health (SDOH) Interventions SDOH Screenings   Food Insecurity: No Food Insecurity (09/20/2022)  Housing: Low Risk  (09/20/2022)  Transportation Needs: No Transportation Needs (09/20/2022)  Utilities: Not At Risk (09/20/2022)  Tobacco Use: High Risk (09/20/2022)     Readmission Risk Interventions    06/21/2022    3:36 PM  Readmission Risk Prevention Plan  Medication Screening Complete  Transportation Screening Complete

## 2022-09-22 ENCOUNTER — Ambulatory Visit (HOSPITAL_COMMUNITY)
Admission: RE | Admit: 2022-09-22 | Discharge: 2022-09-22 | Disposition: A | Discharge status: Home / Self Care | Payer: MEDICAID | Source: Ambulatory Visit | Attending: Neurosurgery | Admitting: Neurosurgery

## 2022-09-22 DIAGNOSIS — I607 Nontraumatic subarachnoid hemorrhage from unspecified intracranial artery: Secondary | ICD-10-CM | POA: Insufficient documentation

## 2022-09-23 ENCOUNTER — Emergency Department (HOSPITAL_COMMUNITY)
Admission: EM | Admit: 2022-09-23 | Discharge: 2022-09-24 | Disposition: A | Discharge status: Home / Self Care | Payer: MEDICAID | Attending: Emergency Medicine | Admitting: Emergency Medicine

## 2022-09-23 ENCOUNTER — Encounter (HOSPITAL_COMMUNITY): Payer: Self-pay | Admitting: Neurosurgery

## 2022-09-23 ENCOUNTER — Emergency Department (HOSPITAL_COMMUNITY): Payer: MEDICAID

## 2022-09-23 ENCOUNTER — Ambulatory Visit: Payer: Medicaid Other | Admitting: Physical Therapy

## 2022-09-23 ENCOUNTER — Other Ambulatory Visit: Payer: Self-pay

## 2022-09-23 DIAGNOSIS — J449 Chronic obstructive pulmonary disease, unspecified: Secondary | ICD-10-CM | POA: Insufficient documentation

## 2022-09-23 DIAGNOSIS — Z79899 Other long term (current) drug therapy: Secondary | ICD-10-CM | POA: Diagnosis not present

## 2022-09-23 DIAGNOSIS — Z8541 Personal history of malignant neoplasm of cervix uteri: Secondary | ICD-10-CM | POA: Insufficient documentation

## 2022-09-23 DIAGNOSIS — Z5329 Procedure and treatment not carried out because of patient's decision for other reasons: Secondary | ICD-10-CM | POA: Diagnosis not present

## 2022-09-23 DIAGNOSIS — R569 Unspecified convulsions: Secondary | ICD-10-CM | POA: Diagnosis not present

## 2022-09-23 DIAGNOSIS — R4182 Altered mental status, unspecified: Secondary | ICD-10-CM

## 2022-09-23 DIAGNOSIS — F17210 Nicotine dependence, cigarettes, uncomplicated: Secondary | ICD-10-CM | POA: Insufficient documentation

## 2022-09-23 LAB — CBC WITH DIFFERENTIAL/PLATELET
Abs Immature Granulocytes: 0.02 10*3/uL (ref 0.00–0.07)
Basophils Absolute: 0 10*3/uL (ref 0.0–0.1)
Basophils Relative: 0 %
Eosinophils Absolute: 0 10*3/uL (ref 0.0–0.5)
Eosinophils Relative: 0 %
HCT: 32.6 % — ABNORMAL LOW (ref 36.0–46.0)
Hemoglobin: 10.8 g/dL — ABNORMAL LOW (ref 12.0–15.0)
Immature Granulocytes: 0 %
Lymphocytes Relative: 13 %
Lymphs Abs: 1 10*3/uL (ref 0.7–4.0)
MCH: 28.3 pg (ref 26.0–34.0)
MCHC: 33.1 g/dL (ref 30.0–36.0)
MCV: 85.6 fL (ref 80.0–100.0)
Monocytes Absolute: 0.4 10*3/uL (ref 0.1–1.0)
Monocytes Relative: 5 %
Neutro Abs: 6.5 10*3/uL (ref 1.7–7.7)
Neutrophils Relative %: 82 %
Platelets: 311 10*3/uL (ref 150–400)
RBC: 3.81 MIL/uL — ABNORMAL LOW (ref 3.87–5.11)
RDW: 13.5 % (ref 11.5–15.5)
WBC: 7.9 10*3/uL (ref 4.0–10.5)
nRBC: 0 % (ref 0.0–0.2)

## 2022-09-23 LAB — COMPREHENSIVE METABOLIC PANEL
ALT: 8 U/L (ref 0–44)
AST: 12 U/L — ABNORMAL LOW (ref 15–41)
Albumin: 2.8 g/dL — ABNORMAL LOW (ref 3.5–5.0)
Alkaline Phosphatase: 45 U/L (ref 38–126)
Anion gap: 15 (ref 5–15)
BUN: 6 mg/dL (ref 6–20)
CO2: 27 mmol/L (ref 22–32)
Calcium: 9.2 mg/dL (ref 8.9–10.3)
Chloride: 95 mmol/L — ABNORMAL LOW (ref 98–111)
Creatinine, Ser: 0.75 mg/dL (ref 0.44–1.00)
GFR, Estimated: >60 mL/min (ref >60)
Glucose, Bld: 66 mg/dL — ABNORMAL LOW (ref 70–99)
Potassium: 3.4 mmol/L — ABNORMAL LOW (ref 3.5–5.1)
Sodium: 137 mmol/L (ref 135–145)
Total Bilirubin: 0.8 mg/dL (ref 0.3–1.2)
Total Protein: 6.3 g/dL — ABNORMAL LOW (ref 6.5–8.1)

## 2022-09-23 LAB — I-STAT CG4 LACTIC ACID, ED: Lactic Acid, Venous: 1.7 mmol/L (ref 0.5–1.9)

## 2022-09-23 LAB — HCG, QUANTITATIVE, PREGNANCY: hCG, Beta Chain, Quant, S: <1 m[IU]/mL (ref <5)

## 2022-09-23 MED ORDER — LIDOCAINE-EPINEPHRINE-TETRACAINE (LET) TOPICAL GEL
3.0000 mL | Freq: Once | TOPICAL | Status: AC
  Administered 2022-09-23: 3 mL via TOPICAL
  Filled 2022-09-23: qty 3

## 2022-09-23 NOTE — ED Provider Notes (Signed)
[place] EMERGENCY DEPARTMENT AT [facility] Provider Note  CSN: [id number] Arrival date & time: 09/23/22 2033  Chief Complaint(s) Seizures  HPI Kristin Simmons is a 37 y.o. female with past medical history as below, significant for [facility], MCA aneurysm, right ophthalmic aneurysm s/p clipping  who presents to the ED with complaint of possible seizure. She was recently admitted 8/6 (dc 8/7) following right cranioplasty. Reports she has been doing well since discharge. This morning was noted to have a flapping motion to her left hand, somewhat rhythmic. Pt was less alert during this time. Symptoms resolved after a few seconds and she did not have a post-ictal period. She feels back to baseline currently. She has mild pain at her surgical site but o/w no complaints. This episode was witnessed by mother. Reports that previously she has had similar hand movements on the right side but not on the left previously. Prior EEG in last few months which was stable per review, not on AED's currently   Past Medical History Past Medical History:  Diagnosis Date   Anemia    Anxiety    Arthritis    Asthma    Cancer (HCC)    cervical   Depression    Headache    Hypertension    She was taking her mother's blood pressure meds but states she doesn't need bp meds now.   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   Brain bleed (HCC) 06/02/2022   Status post craniotomy 05/31/2022   SAH (subarachnoid hemorrhage) (HCC) 05/31/2022   Cerebral aneurysm 03/08/2022   Aneurysm, cerebral, nonruptured 03/08/2022   Other spondylosis with radiculopathy, cervical region 04/15/2021   PROM (premature rupture of membranes) 03/26/2018   Postpartum hypertension 03/26/2018   Trichomoniasis 12/05/2017    High risk social situation  12/05/2017   Supervision of normal pregnancy 10/19/2017   History of cocaine use 10/19/2017   Adjustment disorder with depressed mood 09/13/2017   Anxiety disorder, unspecified 09/12/2017   Abnormal Pap smear of cervix 01/18/2016   Smoker 01/13/2016   Depression 01/13/2016   Alcohol use affecting pregnancy, first trimester 01/13/2016   History of preterm delivery, currently pregnant 01/13/2016   Underweight 01/13/2016   Home Medication(s) Prior to Admission medications   Medication Sig Start Date End Date Taking? Authorizing Provider  acetaminophen (TYLENOL) 325 MG tablet Take 2 tablets (650 mg total) by mouth every 4 (four) hours as needed for mild pain (temp > 100.5). 03/09/22  Yes Lisbeth Renshaw, MD  HYDROcodone-acetaminophen (NORCO/VICODIN) 5-325 MG tablet Take 1 tablet by mouth every 6 (six) hours as needed for up to 5 days for moderate pain. 09/21/22 09/26/22 Yes Lisbeth Renshaw, MD  Lactobacillus (ACIDOPHILUS) CAPS capsule Take 1 capsule by mouth with breakfast, with lunch, and with evening meal. Patient not taking: Reported on 09/14/2022 07/19/22   Angiulli, Mcarthur Rossetti, PA-C  amLODipine (NORVASC) 10 MG tablet Take 1 tablet (10 mg total) by mouth daily. Patient not taking: Reported on 09/14/2022 07/19/22   Angiulli, Mcarthur Rossetti, PA-C  Baclofen 5 MG TABS Take 1 tablet (5 mg total) by mouth 2 (two) times daily. Patient not taking: Reported on 09/14/2022 07/19/22   Angiulli, Mcarthur Rossetti, PA-C  diclofenac Sodium (VOLTAREN) 1 % GEL Apply 2 g topically 4 (four) times daily.  Patient not taking: Reported on 09/14/2022 07/19/22   Angiulli, Mcarthur Rossetti, PA-C  folic acid (FOLVITE) 1 MG tablet Take 1 tablet (1 mg total) by mouth daily. Patient not taking: Reported on 09/14/2022 07/19/22   Angiulli, Mcarthur Rossetti, PA-C  gabapentin (NEURONTIN) 300 MG capsule Take 1 capsule (300 mg) by mouth twice daily and 2 capsules (600 mg) nightly. Patient not taking: Reported on 09/23/2022 07/19/22    Angiulli, Mcarthur Rossetti, PA-C  lidocaine (LIDODERM) 5 % Place 3 patches onto the skin daily. Patient not taking: Reported on 09/14/2022 07/19/22   Angiulli, Mcarthur Rossetti, PA-C  polyethylene glycol (MIRALAX / GLYCOLAX) 17 g packet Take 17 g by mouth daily. Patient not taking: Reported on 09/14/2022 07/19/22   Angiulli, Mcarthur Rossetti, PA-C  traZODone (DESYREL) 100 MG tablet Take 1 tablet (100 mg total) by mouth at bedtime as needed for sleep. Patient not taking: Reported on 09/14/2022 07/19/22   Angiulli, Mcarthur Rossetti, PA-C                                                                                                                                    Past Surgical History Past Surgical History:  Procedure Laterality Date   CRANIOPLASTY Right 09/20/2022   Procedure: CRANIOPLASTY, HARVEST OF ABDOMINAL BONE FLAP;  Surgeon: Lisbeth Renshaw, MD;  Location: [facility] OR;  Service: Neurosurgery;  Laterality: Right;   CRANIOTOMY Right 05/31/2022   Procedure: RIGHT PTERIONAL CRANIOTOMY FOR CLIPPING OF MIDDLE CEREBRAL ARTERY  ANEURYSM WITH PLACEMENT OF BONEFLAP IN ABDOMEN;  Surgeon: Lisbeth Renshaw, MD;  Location: [facility] OR;  Service: Neurosurgery;  Laterality: Right;   IR 3D INDEPENDENT WKST  03/08/2022   IR ANGIO INTRA EXTRACRAN SEL INTERNAL CAROTID BILAT MOD SED  03/08/2022   IR ANGIO VERTEBRAL SEL VERTEBRAL BILAT MOD SED  03/08/2022   IR ANGIOGRAM FOLLOW UP STUDY  03/08/2022   IR ANGIOGRAM FOLLOW UP STUDY  03/08/2022   IR ANGIOGRAM FOLLOW UP STUDY  03/08/2022   IR NEURO EACH ADD'L AFTER BASIC UNI RIGHT (MS)  03/08/2022   IR TRANSCATH/EMBOLIZ  03/08/2022   RADIOLOGY WITH ANESTHESIA N/A 03/08/2022   Procedure: Diagnostic angiogram, Possible Coil Embolization of Aneurysm;  Surgeon: Lisbeth Renshaw, MD;  Location[facility];  Service: Radiology;  Laterality: N/A;   TUBAL LIGATION Bilateral 03/27/2018   Procedure: POST PARTUM TUBAL LIGATION;  Surgeon: Levie Heritage, DO;  Location: [facility];  Service: Gynecology;   Laterality: Bilateral;   Family History Family History  Problem Relation Age of Onset   Alzheimer's disease Paternal Grandmother    Cancer Maternal Grandmother    Cancer Maternal Grandfather    Hypertension Father    Anemia Mother    Hypertension Mother    Thyroid disease Mother    Diabetes Sister    Hypertension Sister    Mental illness Brother    Asthma Daughter    Bronchitis Daughter    Asthma Daughter  Bronchitis Daughter    Asthma Daughter    Bronchitis Daughter     Social History Social History   Tobacco Use   Smoking status: Every Day    Current packs/day: 0.50    Average packs/day: 0.5 packs/day for 15.0 years (7.5 ttl pk-yrs)    Types: Cigarettes   Smokeless tobacco: Never  Vaping Use   Vaping status: Some Days   Substances: Nicotine  Substance Use Topics   Alcohol use: Yes    Alcohol/week: 14.0 standard drinks of alcohol    Types: 14 Cans of beer per week    Comment: 2 beers daily (malt liquor)   Drug use: Not Currently   Allergies Latex  Review of Systems Review of Systems  Constitutional:  Negative for chills and fever.  HENT:  Negative for congestion.   Eyes:  Negative for photophobia and pain.  Respiratory:  Negative for chest tightness and shortness of breath.   Gastrointestinal:  Negative for abdominal pain, nausea and vomiting.  Genitourinary:  Negative for difficulty urinating.  Musculoskeletal:  Negative for arthralgias.  Skin:  Positive for wound. Negative for rash.  Neurological:  Positive for tremors and headaches. Negative for speech difficulty and numbness.  Psychiatric/Behavioral:  Negative for agitation and confusion.     Physical Exam Vital Signs  I have reviewed the triage vital signs BP (!) 148/91   Pulse 92   Temp 98.3 F (36.8 C) (Oral)   Resp 12   Ht 5\' 4"  (1.626 m)   Wt 49.4 kg   LMP 09/05/2022 (Approximate)   SpO2 100%   BMI 18.69 kg/m  Physical Exam Vitals and nursing note reviewed.  Constitutional:       General: She is not in acute distress.    Appearance: Normal appearance.  HENT:     Head: Normocephalic and atraumatic. No raccoon eyes, Battle's sign, right periorbital erythema or left periorbital erythema.     Jaw: There is normal jaw occlusion.      Right Ear: External ear normal.     Left Ear: External ear normal.     Nose: Nose normal.     Mouth/Throat:     Mouth: Mucous membranes are moist.  Eyes:     General: No scleral icterus.       Right eye: No discharge.        Left eye: No discharge.     Extraocular Movements: Extraocular movements intact.     Pupils: Pupils are equal, round, and reactive to light.  Cardiovascular:     Rate and Rhythm: Normal rate and regular rhythm.     Pulses: Normal pulses.     Heart sounds: Normal heart sounds.  Pulmonary:     Effort: Pulmonary effort is normal. No respiratory distress.     Breath sounds: Normal breath sounds. No stridor.  Abdominal:     General: Abdomen is flat. There is no distension.     Palpations: Abdomen is soft.     Tenderness: There is no abdominal tenderness.  Musculoskeletal:     Cervical back: Normal range of motion. No rigidity.     Right lower leg: No edema.     Left lower leg: No edema.  Skin:    General: Skin is warm and dry.     Capillary Refill: Capillary refill takes less than 2 seconds.  Neurological:     Mental Status: She is alert and oriented to person, place, and time.     GCS: GCS eye subscore is  4. GCS verbal subscore is 5. GCS motor subscore is 6.     Cranial Nerves: Cranial nerves 2-12 are intact. No dysarthria.     Sensory: Sensation is intact. No sensory deficit.     Motor: Weakness present.     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   Psychiatric:        Mood and Affect: Mood normal.        Behavior: Behavior normal. Behavior is  cooperative.     ED Results and Treatments Labs (all labs ordered are listed, but only abnormal results are displayed) Labs Reviewed  CBC WITH DIFFERENTIAL/PLATELET - Abnormal; Notable for the following components:      Result Value   RBC 3.81 (*)    Hemoglobin 10.8 (*)    HCT 32.6 (*)    All other components within normal limits  COMPREHENSIVE METABOLIC PANEL - Abnormal; Notable for the following components:   Potassium 3.4 (*)    Chloride 95 (*)    Glucose, Bld 66 (*)    Total Protein 6.3 (*)    Albumin 2.8 (*)    AST 12 (*)    All other components within normal limits  RAPID URINE DRUG SCREEN, HOSP PERFORMED - Abnormal; Notable for the following components:   Opiates POSITIVE (*)    All other components within normal limits  HCG, QUANTITATIVE, PREGNANCY  I-STAT CG4 LACTIC ACID, ED                                                                                                                          Radiology CT Head Wo Contrast  Result Date: 09/23/2022 CLINICAL DATA:  Seizure disorder. EXAM: CT HEAD WITHOUT CONTRAST TECHNIQUE: Contiguous axial images were obtained from the base of the skull through the vertex without intravenous contrast. RADIATION DOSE REDUCTION: This exam was performed according to the departmental dose-optimization program which includes automated exposure control, adjustment of the mA and/or kV according to patient size and/or use of iterative reconstruction technique. COMPARISON:  09/22/2022 and 07/02/2022 FINDINGS: Brain: Unchanged appearance of the brain with small amount of extra-axial blood over the right frontal operculum, underneath the craniotomy site. Right frontal and anterior temporal encephalomalacia. Mild dilatation of the right lateral ventricle. Rightward midline shift of approximately 7 mm. Vascular: Coil site near the right carotid terminus. Aneurysm clip near the right MCA bifurcation. Skull: Right frontoparietal craniotomy with small volume  pneumocephalus and overlying skin staples. Sinuses/Orbits: Negative Other: None IMPRESSION: 1. Unchanged appearance of the brain with small amount of extra-axial blood over the right frontal operculum, underneath the craniotomy site. 2. Rightward midline shift of approximately 7 mm. 3. Right frontal and anterior temporal encephalomalacia. Electronically Signed   By: Deatra Robinson M.D.   On: 09/23/2022 22:07    Pertinent labs & imaging results that were  available during my care of the patient were reviewed by me and considered in my medical decision making (see MDM for details).  Medications Ordered in ED Medications  lidocaine-EPINEPHrine-tetracaine (LET) topical gel (3 mLs Topical Given 09/23/22 2218)                                                                                                                                     Procedures Procedures  (including critical care time)  Medical Decision Making / ED Course    Medical Decision Making:    LILLYE MUNDELL is a 37 y.o. female  with past medical history as below, significant for [facility], MCA aneurysm, right ophthalmic aneurysm s/p clipping  who presents to the ED with complaint of possible seizure. . The complaint involves an extensive differential diagnosis and also carries with it a high risk of complications and morbidity.  Serious etiology was considered. Ddx includes but is not limited to: Differential diagnoses for altered mental status includes but is not exclusive to alcohol, illicit or prescription medications, intracranial pathology such as stroke, intracerebral hemorrhage, fever or infectious causes including sepsis, hypoxemia, uremia, trauma, endocrine related disorders such as diabetes, hypoglycemia, thyroid-related diseases, etc.   Complete initial physical exam performed, notably the patient  was NAD, back to baseline, neuro exam unchanged from prior CVA per pt/mother.    Reviewed and confirmed nursing documentation for  past medical history, family history, social history.  Vital signs reviewed.    Clinical Course as of 09/24/22 1548  Fri Sep 23, 2022  2309 Pt reports she is back to her baseline No complaints currently Requesting discharge  Mother at bedside Neuro exam is nonfocal [SG]  Sat Sep 24, 2022  0011 [facility] seems similar to prior, will d/w nsgy [SG]  0020 Nsgy will review images and call back [SG]  0028 Opiates(!): POSITIVE Recent rx for opiates post op [SG]  0028 Spoke w/ nsgy, recommend medicine admission for seizure workup w/ NSGY on consult.  [SG]  [phone] Consult to Dr. Janalyn Shy, hospitalist, who states she will not have capacity to see this patient tonight, will be a sign out to the AM hospitalist team. She is requesting that I order an EEG to be initiated in the ED and states AM hospital medicine team will consult and admit the patient after shift change.  [RS]    Clinical Course User Index [RS] Sponseller, Eugene Gavia, PA-C [SG] Sloan Leiter, DO    Narrative: 37 yo female Hx mx intracranial aneurysms s/p repair ?seizure this morning, now back to baseline No post ictal period, was less responsive during episode per mother  Workup here stable Spoke with NSGY (cosentino) recommend seizure workup/eeg overnight and they can see in consult tomorrow if needed  Signed out to incoming provider pending admission               Additional history obtained: -Additional history obtained from  family -External records from outside source obtained and reviewed including: Chart review including previous notes, labs, imaging, consultation notes including  Recent op visits Prior imaging PDMP   Lab Tests: -I ordered, reviewed, and interpreted labs.   The pertinent results include:   Labs Reviewed  CBC WITH DIFFERENTIAL/PLATELET - Abnormal; Notable for the following components:      Result Value   RBC 3.81 (*)    Hemoglobin 10.8 (*)    HCT 32.6 (*)    All other components within  normal limits  COMPREHENSIVE METABOLIC PANEL - Abnormal; Notable for the following components:   Potassium 3.4 (*)    Chloride 95 (*)    Glucose, Bld 66 (*)    Total Protein 6.3 (*)    Albumin 2.8 (*)    AST 12 (*)    All other components within normal limits  RAPID URINE DRUG SCREEN, HOSP PERFORMED - Abnormal; Notable for the following components:   Opiates POSITIVE (*)    All other components within normal limits  HCG, QUANTITATIVE, PREGNANCY  I-STAT CG4 LACTIC ACID, ED    Notable for as above stable  EKG   EKG Interpretation Date/Time:  Friday September 23 2022 21:05:27 EDT Ventricular Rate:  90 PR Interval:  152 QRS Duration:  91 QT Interval:  370 QTC Calculation: 453 R Axis:   69  Text Interpretation: Sinus rhythm RSR' in V1 or V2, probably normal variant Confirmed by Zadie Rhine ([id number]) on 09/24/2022 3:01:08 AM         Imaging Studies ordered: I ordered imaging studies including CTH I independently visualized the following imaging with scope of interpretation limited to determining acute life threatening conditions related to emergency care; findings noted above, significant for post op changes I independently visualized and interpreted imaging. I agree with the radiologist interpretation   Medicines ordered and prescription drug management: Meds ordered this encounter  Medications   lidocaine-EPINEPHrine-tetracaine (LET) topical gel    -I have reviewed the patients home medicines and have made adjustments as needed   Consultations Obtained: I requested consultation with the NSGY,  and discussed lab and imaging findings as well as pertinent plan - they recommend: overnight admission seizure w/u   Cardiac Monitoring: The patient was maintained on a cardiac monitor.  I personally viewed and interpreted the cardiac monitored which showed an underlying rhythm of: NSR  Social Determinants of Health:  Diagnosis or treatment significantly limited by social  determinants of health: current smoker   Reevaluation: After the interventions noted above, I reevaluated the patient and found that they have resolved  Co morbidities that complicate the patient evaluation  Past Medical History:  Diagnosis Date   Anemia    Anxiety    Arthritis    Asthma    Cancer (HCC)    cervical   Depression    Headache    Hypertension    She was taking her mother's blood pressure meds but states she doesn't need bp meds now.   Vaginal Pap smear, abnormal       Dispostion: Disposition decision including need for hospitalization was considered, and patient disposition pending at time of sign out.    Final Clinical Impression(s) / ED Diagnoses Final diagnoses:  Seizure-like activity (HCC)  Altered mental status, unspecified altered mental status type        Sloan Leiter, DO 09/24/22 1548

## 2022-09-23 NOTE — ED Triage Notes (Signed)
Pt from home after 2-3 witnessed seizures today. 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 Skull flap replaced 4 days ago. Dc'd yesterday. Per EMS pt had a bottle of norco that was dispensed yesterday for 20 pills and had only 1 pill left.

## 2022-09-24 ENCOUNTER — Encounter: Payer: Self-pay | Admitting: Surgery

## 2022-09-24 NOTE — ED Provider Notes (Signed)
  Physical Exam  BP (!) 140/101   Pulse 76   Temp 98.4 F (36.9 C) (Oral)   Resp 16   Ht 5\' 4"  (1.626 m)   Wt 49.4 kg   LMP 09/05/2022 (Approximate)   SpO2 100%   BMI 18.69 kg/m   Physical Exam  Procedures  Procedures  ED Course / MDM   Clinical Course as of 09/24/22 0242  Fri Sep 23, 2022  2309 Pt reports she is back to her baseline No complaints currently Requesting discharge  Simmons at bedside Neuro exam is nonfocal [SG]  Sat Sep 24, 2022  0011 [facility] seems similar to prior, will d/w nsgy [SG]  0020 Nsgy will review images and call back [SG]  0028 Opiates(!): POSITIVE Recent rx for opiates post op [SG]  0028 Spoke w/ nsgy, recommend medicine admission for seizure workup w/ NSGY on consult.  [SG]  [phone] Consult to Dr. Janalyn Shy, hospitalist, who states she will not have capacity to see this patient tonight, will be a sign out to the AM hospitalist team. She is requesting that I order an EEG to be initiated in the ED and states AM hospital medicine team will consult and admit the patient after shift change.  [RS]    Clinical Course User Index [RS] , Eugene Gavia, PA-C [SG] Sloan Leiter, DO   Medical Decision Making Amount and/or Complexity of Data Reviewed Labs: ordered. Decision-making details documented in ED Course. Radiology: ordered.    Care of this patient assumed from preceding ED provider Dr. Gwenette Greet and time of shift change.  Please see his associated note for further insight into the patient's ED course.  In brief patient is a 37 year old female with extensive history of intracranial aneurysms, history of CVA and most recently was admitted on 8/6 for cranioplasty.  Presented to the ED for possible seizure with rhythmic flapping motion of the left hand noted by family in the morning.  Patient underwent workup in the emergency department without acute emergent concern.  Her case was discussed with neurosurgery per preceding ED provider who recommended medical  admission for completion of seizure workup.  Care of the patient was signed out to this provider pending hospital medicine consult call.  Consult to hospital medicine as above, however subsequently patient's ED RN informed me that she did not wish to be admitted.  Patient was evaluated bedside by this provider, neuroexam is nonfocal.  Patient adamant that she does not want to be admitted to the hospital.  States she will follow-up in the outpatient setting with her neurosurgeon Dr. Conchita Kristin.  She was informed that she will leaving AGAINST MEDICAL ADVICE.  She voiced understanding of this and wished to proceed with AMA.  Kristin Simmons voiced understanding of her medical evaluation and treatment plan. Each of their questions answered to their expressed satisfaction.  Return precautions were given.   This chart was dictated using voice recognition software, Dragon. Despite the best efforts of this provider to proofread and correct errors, errors may still occur which can change documentation meaning.       Kristin Lore, PA-C 09/24/22 0354    Zadie Rhine, MD 09/24/22 [phone]

## 2022-09-24 NOTE — Discharge Instructions (Addendum)
You were seen in the ER today for your concern for seizure-like activity. Please call your neurosurgeon today for a follow up. You are leaving against medical advice; Please return to the ER with any new severe symptoms.

## 2022-09-28 ENCOUNTER — Emergency Department (HOSPITAL_COMMUNITY): Payer: MEDICAID

## 2022-09-28 ENCOUNTER — Other Ambulatory Visit: Payer: Self-pay

## 2022-09-28 ENCOUNTER — Observation Stay (HOSPITAL_COMMUNITY)
Admission: EM | Admit: 2022-09-28 | Discharge: 2022-09-28 | Discharge status: Against Medical Advice | Payer: MEDICAID | Attending: Internal Medicine | Admitting: Internal Medicine

## 2022-09-28 ENCOUNTER — Encounter (HOSPITAL_COMMUNITY): Payer: Self-pay

## 2022-09-28 DIAGNOSIS — Z87891 Personal history of nicotine dependence: Secondary | ICD-10-CM | POA: Diagnosis not present

## 2022-09-28 DIAGNOSIS — Z818 Family history of other mental and behavioral disorders: Secondary | ICD-10-CM

## 2022-09-28 DIAGNOSIS — Z91040 Latex allergy status: Secondary | ICD-10-CM | POA: Diagnosis not present

## 2022-09-28 DIAGNOSIS — Z825 Family history of asthma and other chronic lower respiratory diseases: Secondary | ICD-10-CM

## 2022-09-28 DIAGNOSIS — Z820 Family history of epilepsy and other diseases of the nervous system: Secondary | ICD-10-CM

## 2022-09-28 DIAGNOSIS — R4182 Altered mental status, unspecified: Secondary | ICD-10-CM | POA: Diagnosis not present

## 2022-09-28 DIAGNOSIS — I671 Cerebral aneurysm, nonruptured: Secondary | ICD-10-CM | POA: Insufficient documentation

## 2022-09-28 DIAGNOSIS — Z79899 Other long term (current) drug therapy: Secondary | ICD-10-CM | POA: Diagnosis not present

## 2022-09-28 DIAGNOSIS — E876 Hypokalemia: Secondary | ICD-10-CM | POA: Diagnosis present

## 2022-09-28 DIAGNOSIS — Z8541 Personal history of malignant neoplasm of cervix uteri: Secondary | ICD-10-CM

## 2022-09-28 DIAGNOSIS — G40909 Epilepsy, unspecified, not intractable, without status epilepticus: Secondary | ICD-10-CM | POA: Diagnosis not present

## 2022-09-28 DIAGNOSIS — R569 Unspecified convulsions: Secondary | ICD-10-CM | POA: Diagnosis present

## 2022-09-28 DIAGNOSIS — I10 Essential (primary) hypertension: Secondary | ICD-10-CM | POA: Diagnosis present

## 2022-09-28 DIAGNOSIS — J45909 Unspecified asthma, uncomplicated: Secondary | ICD-10-CM | POA: Diagnosis present

## 2022-09-28 DIAGNOSIS — F419 Anxiety disorder, unspecified: Secondary | ICD-10-CM | POA: Diagnosis present

## 2022-09-28 DIAGNOSIS — Z9889 Other specified postprocedural states: Secondary | ICD-10-CM | POA: Diagnosis not present

## 2022-09-28 DIAGNOSIS — I69354 Hemiplegia and hemiparesis following cerebral infarction affecting left non-dominant side: Secondary | ICD-10-CM | POA: Diagnosis not present

## 2022-09-28 DIAGNOSIS — F17210 Nicotine dependence, cigarettes, uncomplicated: Secondary | ICD-10-CM | POA: Insufficient documentation

## 2022-09-28 DIAGNOSIS — G8194 Hemiplegia, unspecified affecting left nondominant side: Secondary | ICD-10-CM | POA: Diagnosis not present

## 2022-09-28 DIAGNOSIS — Z833 Family history of diabetes mellitus: Secondary | ICD-10-CM | POA: Diagnosis not present

## 2022-09-28 DIAGNOSIS — Z8249 Family history of ischemic heart disease and other diseases of the circulatory system: Secondary | ICD-10-CM | POA: Diagnosis not present

## 2022-09-28 DIAGNOSIS — F32A Depression, unspecified: Secondary | ICD-10-CM | POA: Diagnosis present

## 2022-09-28 DIAGNOSIS — Z8349 Family history of other endocrine, nutritional and metabolic diseases: Secondary | ICD-10-CM | POA: Diagnosis not present

## 2022-09-28 DIAGNOSIS — Z5329 Procedure and treatment not carried out because of patient's decision for other reasons: Secondary | ICD-10-CM | POA: Diagnosis present

## 2022-09-28 LAB — URINALYSIS, ROUTINE W REFLEX MICROSCOPIC
Bilirubin Urine: NEGATIVE
Glucose, UA: NEGATIVE mg/dL
Ketones, ur: NEGATIVE mg/dL
Leukocytes,Ua: NEGATIVE
Nitrite: NEGATIVE
Protein, ur: 30 mg/dL — AB
Specific Gravity, Urine: 1.02 (ref 1.005–1.030)
pH: 6.5 (ref 5.0–8.0)

## 2022-09-28 LAB — CBC WITH DIFFERENTIAL/PLATELET
Abs Immature Granulocytes: 0.01 10*3/uL (ref 0.00–0.07)
Basophils Absolute: 0.1 10*3/uL (ref 0.0–0.1)
Basophils Relative: 1 %
Eosinophils Absolute: 0.2 10*3/uL (ref 0.0–0.5)
Eosinophils Relative: 2 %
HCT: 38.9 % (ref 36.0–46.0)
Hemoglobin: 12.2 g/dL (ref 12.0–15.0)
Immature Granulocytes: 0 %
Lymphocytes Relative: 52 %
Lymphs Abs: 4.2 10*3/uL — ABNORMAL HIGH (ref 0.7–4.0)
MCH: 27.9 pg (ref 26.0–34.0)
MCHC: 31.4 g/dL (ref 30.0–36.0)
MCV: 88.8 fL (ref 80.0–100.0)
Monocytes Absolute: 0.6 10*3/uL (ref 0.1–1.0)
Monocytes Relative: 8 %
Neutro Abs: 3 10*3/uL (ref 1.7–7.7)
Neutrophils Relative %: 37 %
Platelets: 467 10*3/uL — ABNORMAL HIGH (ref 150–400)
RBC: 4.38 MIL/uL (ref 3.87–5.11)
RDW: 13.9 % (ref 11.5–15.5)
WBC: 8.1 10*3/uL (ref 4.0–10.5)
nRBC: 0 % (ref 0.0–0.2)

## 2022-09-28 LAB — BASIC METABOLIC PANEL
Anion gap: 20 — ABNORMAL HIGH (ref 5–15)
BUN: 7 mg/dL (ref 6–20)
CO2: 16 mmol/L — ABNORMAL LOW (ref 22–32)
Calcium: 9.7 mg/dL (ref 8.9–10.3)
Chloride: 102 mmol/L (ref 98–111)
Creatinine, Ser: 0.96 mg/dL (ref 0.44–1.00)
GFR, Estimated: >60 mL/min (ref >60)
Glucose, Bld: 120 mg/dL — ABNORMAL HIGH (ref 70–99)
Potassium: 3 mmol/L — ABNORMAL LOW (ref 3.5–5.1)
Sodium: 138 mmol/L (ref 135–145)

## 2022-09-28 LAB — MAGNESIUM: Magnesium: 1.5 mg/dL — ABNORMAL LOW (ref 1.7–2.4)

## 2022-09-28 LAB — URINALYSIS, MICROSCOPIC (REFLEX): RBC / HPF: >50 RBC/hpf (ref 0–5)

## 2022-09-28 LAB — RAPID URINE DRUG SCREEN, HOSP PERFORMED
Amphetamines: NOT DETECTED
Barbiturates: NOT DETECTED
Benzodiazepines: NOT DETECTED
Cocaine: NOT DETECTED
Opiates: NOT DETECTED
Tetrahydrocannabinol: NOT DETECTED

## 2022-09-28 MED ORDER — ONDANSETRON HCL 4 MG PO TABS: 4.0000 mg | ORAL_TABLET | Freq: Four times a day (QID) | ORAL | Status: DC | PRN

## 2022-09-28 MED ORDER — POTASSIUM CHLORIDE 10 MEQ/100ML IV SOLN
10.0000 meq | INTRAVENOUS | Status: AC
  Administered 2022-09-28 (×5): 10 meq via INTRAVENOUS
  Filled 2022-09-28 (×5): qty 100

## 2022-09-28 MED ORDER — SODIUM CHLORIDE 0.9 % IV SOLN: INTRAVENOUS | Status: DC

## 2022-09-28 MED ORDER — LEVETIRACETAM IN NACL 500 MG/100ML IV SOLN
500.0000 mg | Freq: Two times a day (BID) | INTRAVENOUS | Status: DC
  Administered 2022-09-28: 500 mg via INTRAVENOUS
  Filled 2022-09-28: qty 100

## 2022-09-28 MED ORDER — LORAZEPAM 2 MG/ML IJ SOLN
2.0000 mg | INTRAMUSCULAR | Status: DC | PRN
  Filled 2022-09-28: qty 1

## 2022-09-28 MED ORDER — ACETAMINOPHEN 325 MG PO TABS: 650.0000 mg | ORAL_TABLET | Freq: Four times a day (QID) | ORAL | Status: DC | PRN

## 2022-09-28 MED ORDER — LEVETIRACETAM IN NACL 1000 MG/100ML IV SOLN
1000.0000 mg | Freq: Once | INTRAVENOUS | Status: AC
  Administered 2022-09-28: 1000 mg via INTRAVENOUS
  Filled 2022-09-28: qty 100

## 2022-09-28 MED ORDER — MAGNESIUM SULFATE 4 GM/100ML IV SOLN
4.0000 g | Freq: Once | INTRAVENOUS | Status: AC
  Administered 2022-09-28: 4 g via INTRAVENOUS
  Filled 2022-09-28: qty 100

## 2022-09-28 MED ORDER — ALBUTEROL SULFATE (2.5 MG/3ML) 0.083% IN NEBU: 2.5000 mg | INHALATION_SOLUTION | RESPIRATORY_TRACT | Status: DC | PRN

## 2022-09-28 MED ORDER — ACETAMINOPHEN 650 MG RE SUPP: 650.0000 mg | Freq: Four times a day (QID) | RECTAL | Status: DC | PRN

## 2022-09-28 MED ORDER — ONDANSETRON HCL 4 MG/2ML IJ SOLN: 4.0000 mg | Freq: Four times a day (QID) | INTRAMUSCULAR | Status: DC | PRN

## 2022-09-28 NOTE — ED Notes (Signed)
Pt back in room from CT at this time.  Per CT, pt had 30-40 second seizure on table following scan.  IV being obtained at this time.

## 2022-09-28 NOTE — ED Notes (Signed)
Pt is resting in bed, she is asleep and per mother has been very sleepy and lethargic. Pt is attached to monitor/vitals. No distress noted or reported. Side rails up x 2, call light within reach, pt covered with blankets (face is visible), mother is at the bedside.

## 2022-09-28 NOTE — ED Notes (Signed)
Pt is currently awake and eating breakfast while sitting up in the bed. Mother is at the bedside. Pt states that she wants to go home. When I say that being admitted is a good thing, so that we can figure out why you are having seizures, she responds with "I am fine, I wanna go home". Pt is able to eat on her own. When asked how the french toast is she says it is horrible.

## 2022-09-28 NOTE — ED Triage Notes (Addendum)
EMS was called out for a seizure witnessed by her daughter. Patient had a brain surgery about 2 weeks ago. Arrives with surgical staples to the right side of her head. Denies any pain. Patient has been taking tylenol for pain. Patient arrives today with a second seizure since surgery.

## 2022-09-28 NOTE — H&P (Signed)
History and Physical    Kristin Simmons KGU:[id number] DOB: 10/22/1985 DOA: 09/28/2022  PCP: Pcp, No  Patient coming from: home  I have personally briefly reviewed patient's old medical records in [facility]  Chief Complaint: seizure  HPI: Kristin Simmons is a 37 y.o. 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 Patient noted to have seizure activity at home and EMS was called.  Patient currently notes no n/v/d/ abdominal pain, no current HA. No fever chills, no dysuria or diarrhea. Patient states feels back to her baseline.  ED Course:  Vitals: afeb bp 120/105, hr 87, rr 15 , sat 100% on ra  EKG: nsr , early repolarization   CTH: Liquefaction and expansion of nonacute hematoma beneath the right craniotomy flap. Also, there is mild increase in scalp fluid collection external to the bone flap. Despite this change there is still ipsilateral midline shift in the setting of prior right MCA infarct with volume loss.  Wbc 8.1, hgb 12.2, plt 467,  NA 138, K 3, CL 102, bicarb 15, glu 120  cr 0.96  Mag 1.5   IN ED patient also has seizure episode described as tonic clonic.  Neurology was consulted and patient was loaded with 1 gram of keppra.   UA: wbc 6-10 , +bacteria , RBC>50 UDS: neg Tx keppra 1 mg  Review of Systems: As per HPI otherwise 10 point review of systems negative.   Past Medical History:  Diagnosis Date   Anemia    Anxiety    Arthritis    Asthma    Cancer (HCC)    cervical   Depression    Headache    Hypertension    She was taking her mother's blood pressure meds but states she doesn't need bp meds now.   Vaginal Pap smear, abnormal     Past Surgical History:  Procedure Laterality Date   CRANIOPLASTY Right 09/20/2022   Procedure: CRANIOPLASTY, HARVEST OF ABDOMINAL BONE FLAP;  Surgeon:  Lisbeth Renshaw, MD;  Location: [facility] OR;  Service: Neurosurgery;  Laterality: Right;   CRANIOTOMY Right 05/31/2022   Procedure: RIGHT PTERIONAL CRANIOTOMY FOR CLIPPING OF MIDDLE CEREBRAL ARTERY  ANEURYSM WITH PLACEMENT OF BONEFLAP IN ABDOMEN;  Surgeon: Lisbeth Renshaw, MD;  Location: [facility] OR;  Service: Neurosurgery;  Laterality: Right;   IR 3D INDEPENDENT WKST  03/08/2022   IR ANGIO INTRA EXTRACRAN SEL INTERNAL CAROTID BILAT MOD SED  03/08/2022   IR ANGIO VERTEBRAL SEL VERTEBRAL BILAT MOD SED  03/08/2022   IR ANGIOGRAM FOLLOW UP STUDY  03/08/2022   IR ANGIOGRAM FOLLOW UP STUDY  03/08/2022   IR ANGIOGRAM FOLLOW UP STUDY  03/08/2022   IR NEURO EACH ADD'L AFTER BASIC UNI RIGHT (MS)  03/08/2022   IR TRANSCATH/EMBOLIZ  03/08/2022   RADIOLOGY WITH ANESTHESIA N/A 03/08/2022   Procedure: Diagnostic angiogram, Possible Coil Embolization of Aneurysm;  Surgeon: Lisbeth Renshaw, MD;  Location: [facility] OR;  Service: Radiology;  Laterality: N/A;   TUBAL LIGATION Bilateral 03/27/2018   Procedure: POST PARTUM TUBAL LIGATION;  Surgeon: Levie Heritage, DO;  Location: [facility];  Service: Gynecology;  Laterality: Bilateral;     reports that she has been smoking cigarettes. She has a 7.5 pack-year smoking history. She has never used smokeless tobacco. She reports current alcohol use of about 14.0 standard drinks of alcohol per week. She reports that  she does not currently use drugs.  Allergies  Allergen Reactions   Latex Itching and Rash    Family History  Problem Relation Age of Onset   Alzheimer's disease Paternal Grandmother    Cancer Maternal Grandmother    Cancer Maternal Grandfather    Hypertension Father    Anemia Mother    Hypertension Mother    Thyroid disease Mother    Diabetes Sister    Hypertension Sister    Mental illness Brother    Asthma Daughter    Bronchitis Daughter    Asthma Daughter    Bronchitis Daughter    Asthma Daughter    Bronchitis Daughter     Prior to  Admission medications   Medication Sig Start Date End Date Taking? Authorizing Provider  acetaminophen (TYLENOL) 325 MG tablet Take 2 tablets (650 mg total) by mouth every 4 (four) hours as needed for mild pain (temp > 100.5). 03/09/22   Lisbeth Renshaw, MD  Lactobacillus (ACIDOPHILUS) CAPS capsule Take 1 capsule by mouth with breakfast, with lunch, and with evening meal. Patient not taking: Reported on 09/14/2022 07/19/22   Angiulli, Mcarthur Rossetti, PA-C  amLODipine (NORVASC) 10 MG tablet Take 1 tablet (10 mg total) by mouth daily. Patient not taking: Reported on 09/14/2022 07/19/22   Angiulli, Mcarthur Rossetti, PA-C  Baclofen 5 MG TABS Take 1 tablet (5 mg total) by mouth 2 (two) times daily. Patient not taking: Reported on 09/14/2022 07/19/22   Angiulli, Mcarthur Rossetti, PA-C  diclofenac Sodium (VOLTAREN) 1 % GEL Apply 2 g topically 4 (four) times daily. Patient not taking: Reported on 09/14/2022 07/19/22   Angiulli, Mcarthur Rossetti, PA-C  folic acid (FOLVITE) 1 MG tablet Take 1 tablet (1 mg total) by mouth daily. Patient not taking: Reported on 09/14/2022 07/19/22   Angiulli, Mcarthur Rossetti, PA-C  gabapentin (NEURONTIN) 300 MG capsule Take 1 capsule (300 mg) by mouth twice daily and 2 capsules (600 mg) nightly. Patient not taking: Reported on 09/23/2022 07/19/22   Angiulli, Mcarthur Rossetti, PA-C  lidocaine (LIDODERM) 5 % Place 3 patches onto the skin daily. Patient not taking: Reported on 09/14/2022 07/19/22   Angiulli, Mcarthur Rossetti, PA-C  polyethylene glycol (MIRALAX / GLYCOLAX) 17 g packet Take 17 g by mouth daily. Patient not taking: Reported on 09/14/2022 07/19/22   Angiulli, Mcarthur Rossetti, PA-C  traZODone (DESYREL) 100 MG tablet Take 1 tablet (100 mg total) by mouth at bedtime as needed for sleep. Patient not taking: Reported on 09/14/2022 07/19/22   Charlton Amor, PA-C    Physical Exam: Vitals:   09/28/22 0645 09/28/22 0700 09/28/22 0715 09/28/22 0730  BP: 126/88 121/84 119/81   Pulse: 68 73 73 66  Resp: (!) 24 17 (!) 21 (!) 23  Temp:       TempSrc:      SpO2: 99% 98% 97% 98%    Constitutional: NAD, calm, comfortable Vitals:   09/28/22 0645 09/28/22 0700 09/28/22 0715 09/28/22 0730  BP: 126/88 121/84 119/81   Pulse: 68 73 73 66  Resp: (!) 24 17 (!) 21 (!) 23  Temp:      TempSrc:      SpO2: 99% 98% 97% 98%   Eyes: PERRL, lids and conjunctivae normal ENMT: Mucous membranes are moist. Posterior pharynx clear of any exudate or lesions.Normal dentition.  Neck: normal, supple, no masses, no thyromegaly Respiratory: clear to auscultation bilaterally, no wheezing, no crackles. Normal respiratory effort. No accessory muscle use.  Cardiovascular: Regular rate and rhythm, no murmurs / rubs /  gallops. No extremity edema. 2+ pedal pulses.  Abdomen: no tenderness, no masses palpated. No hepatosplenomegaly. Bowel sounds positive.  Musculoskeletal: no clubbing / cyanosis. No joint deformity upper and lower extremities. Good ROM, no contractures. Normal muscle tone.  Skin: no rashes, lesions, ulcers. No induration Neurologic: CN 2-12 grossly intact. Sensation intact,  Strength 4+ on L, 5/5 on right  Psychiatric: Normal judgment and insight. Alert and oriented x 3. Normal mood.    Labs on Admission: I have personally reviewed following labs and imaging studies  CBC: Recent Labs  Lab 09/23/22 2106 09/28/22 0515  WBC 7.9 8.1  NEUTROABS 6.5 3.0  HGB 10.8* 12.2  HCT 32.6* 38.9  MCV 85.6 88.8  PLT 311 467*   Basic Metabolic Panel: Recent Labs  Lab 09/23/22 2106 09/28/22 0515  NA 137 138  K 3.4* 3.0*  CL 95* 102  CO2 27 16*  GLUCOSE 66* 120*  BUN 6 7  CREATININE 0.75 0.96  CALCIUM 9.2 9.7   GFR: Estimated Creatinine Clearance: 64.4 mL/min (by C-G formula based on SCr of 0.96 mg/dL). Liver Function Tests: Recent Labs  Lab 09/23/22 2106  AST 12*  ALT 8  ALKPHOS 45  BILITOT 0.8  PROT 6.3*  ALBUMIN 2.8*   No results for input(s): "LIPASE", "AMYLASE" in the last 168 hours. No results for input(s): "AMMONIA" in  the last 168 hours. Coagulation Profile: No results for input(s): "INR", "PROTIME" in the last 168 hours. Cardiac Enzymes: No results for input(s): "CKTOTAL", "CKMB", "CKMBINDEX", "TROPONINI" in the last 168 hours. BNP (last 3 results) No results for input(s): "PROBNP" in the last 8760 hours. HbA1C: No results for input(s): "HGBA1C" in the last 72 hours. CBG: No results for input(s): "GLUCAP" in the last 168 hours. Lipid Profile: No results for input(s): "CHOL", "HDL", "LDLCALC", "TRIG", "CHOLHDL", "LDLDIRECT" in the last 72 hours. Thyroid Function Tests: No results for input(s): "TSH", "T4TOTAL", "FREET4", "T3FREE", "THYROIDAB" in the last 72 hours. Anemia Panel: No results for input(s): "VITAMINB12", "FOLATE", "FERRITIN", "TIBC", "IRON", "RETICCTPCT" in the last 72 hours. Urine analysis:    Component Value Date/Time   COLORURINE STRAW (A) 06/16/2022 0129   APPEARANCEUR CLEAR 06/16/2022 0129   LABSPEC 1.013 06/16/2022 0129   PHURINE 6.0 06/16/2022 0129   GLUCOSEU 150 (A) 06/16/2022 0129   HGBUR NEGATIVE 06/16/2022 0129   BILIRUBINUR NEGATIVE 06/16/2022 0129   KETONESUR NEGATIVE 06/16/2022 0129   PROTEINUR NEGATIVE 06/16/2022 0129   UROBILINOGEN 1.0 08/13/2014 1547   NITRITE NEGATIVE 06/16/2022 0129   LEUKOCYTESUR NEGATIVE 06/16/2022 0129    Radiological Exams on Admission: CT HEAD WO CONTRAST ( )  Result Date: 09/28/2022 CLINICAL DATA:  Mental status change with unknown cause EXAM: CT HEAD WITHOUT CONTRAST TECHNIQUE: Contiguous axial images were obtained from the base of the skull through the vertex without intravenous contrast. RADIATION DOSE REDUCTION: This exam was performed according to the departmental dose-optimization program which includes automated exposure control, adjustment of the mA and/or kV according to patient size and/or use of iterative reconstruction technique. COMPARISON:  Head CT from 5 days prior FINDINGS: Brain: Interval liquefaction of hemorrhage  beneath a right craniotomy flap. There is also more distended appearance of fluid and gas collection external to the same bone flap. Thickness spanning theflap is 4.1 cm and the anterior to posterior span is ~ 5.5 cm. There is mass effect on the underlying frontal lobe but the frontal lobe is low density with volume loss, midline shift is still towards the right. No evidence of interval infarct.  No hydrocephalus. 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 Skull: Right perianal craniotomy as described. Sinuses/Orbits: No acute finding. IMPRESSION: Liquefaction and expansion of nonacute hematoma beneath the right craniotomy flap. Also, there is mild increase in scalp fluid collection external to the bone flap. Despite this change there is still ipsilateral midline shift in the setting of prior right MCA infarct with volume loss. Electronically Signed   By: Tiburcio Pea M.D.   On: 09/28/2022 05:43    EKG: Independently reviewed. 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 who is s/p crainoplasty 8/7 -s/p 1gram keepra load in ED - discussed with on call neurologist patient can be discharged on 500 mg po bid with neurology follow in am if no further break through seizure -f/u with neurosurgery at previously scheduled interval   Hypokalemia  Hypomagnesemia - s/p KCL  -replete magnesium  - will need to be within normal limits prior to discharge  Hx of Cocaine abuse -UDS currently negative   Asthma -no acute exacerbation -prn nebs   Anxiety  -not on treatment currently  Hypertension  Resume home regimen   DVT prophylaxis: scd Code Status: full/ as discussed per patient wishes in event of cardiac arrest i Family Communication: Roberts,Felicia (Mother) [phone] (Mobile)  Disposition Plan: patient  expected to be admitted less than 2 midnights  Consults called:  Neurology Dr Selina Cooley  Admission status: med tele/progressive   Lurline Del MD [facility]   If 7PM-7AM, please contact night-coverage [email or web address] Password [facility]  09/28/2022, 7:51 AM

## 2022-09-28 NOTE — ED Notes (Signed)
Pt refusing IV at this time.  Pt reportedly refused with EMS as well.

## 2022-09-28 NOTE — ED Provider Notes (Signed)
[place] EMERGENCY DEPARTMENT AT [facility] Provider Note   CSN: [id number] Arrival date & time: 09/28/22  0411     History  Chief Complaint  Patient presents with   Seizures    Kristin Simmons is a 37 y.o. female.  Brought to the emergency department by ambulance from home.  Patient reportedly had seizure-like activity at home.       Home Medications Prior to Admission medications   Medication Sig Start Date End Date Taking? Authorizing Provider  acetaminophen (TYLENOL) 325 MG tablet Take 2 tablets (650 mg total) by mouth every 4 (four) hours as needed for mild pain (temp > 100.5). 03/09/22   Lisbeth Renshaw, MD  Lactobacillus (ACIDOPHILUS) CAPS capsule Take 1 capsule by mouth with breakfast, with lunch, and with evening meal. Patient not taking: Reported on 09/14/2022 07/19/22   Angiulli, Mcarthur Rossetti, PA-C  amLODipine (NORVASC) 10 MG tablet Take 1 tablet (10 mg total) by mouth daily. Patient not taking: Reported on 09/14/2022 07/19/22   Angiulli, Mcarthur Rossetti, PA-C  Baclofen 5 MG TABS Take 1 tablet (5 mg total) by mouth 2 (two) times daily. Patient not taking: Reported on 09/14/2022 07/19/22   Angiulli, Mcarthur Rossetti, PA-C  diclofenac Sodium (VOLTAREN) 1 % GEL Apply 2 g topically 4 (four) times daily. Patient not taking: Reported on 09/14/2022 07/19/22   Angiulli, Mcarthur Rossetti, PA-C  folic acid (FOLVITE) 1 MG tablet Take 1 tablet (1 mg total) by mouth daily. Patient not taking: Reported on 09/14/2022 07/19/22   Angiulli, Mcarthur Rossetti, PA-C  gabapentin (NEURONTIN) 300 MG capsule Take 1 capsule (300 mg) by mouth twice daily and 2 capsules (600 mg) nightly. Patient not taking: Reported on 09/23/2022 07/19/22   Angiulli, Mcarthur Rossetti, PA-C  lidocaine (LIDODERM) 5 % Place 3 patches onto the skin daily. Patient not taking: Reported on 09/14/2022 07/19/22   Angiulli, Mcarthur Rossetti, PA-C  polyethylene glycol (MIRALAX / GLYCOLAX) 17 g packet Take 17 g by mouth daily. Patient not taking: Reported on 09/14/2022 07/19/22    Angiulli, Mcarthur Rossetti, PA-C  traZODone (DESYREL) 100 MG tablet Take 1 tablet (100 mg total) by mouth at bedtime as needed for sleep. Patient not taking: Reported on 09/14/2022 07/19/22   Angiulli, Mcarthur Rossetti, PA-C      Allergies    Latex    Review of Systems   Review of Systems  Physical Exam Updated Vital Signs BP (!) 120/105 (BP Location: Right Arm)   Pulse 87   Temp 98.2 F (36.8 C) (Oral)   Resp 15   LMP 09/05/2022 (Approximate)   SpO2 100%  Physical Exam Vitals and nursing note reviewed.  Constitutional:      General: She is not in acute distress.    Appearance: She is well-developed.  HENT:     Head: Normocephalic and atraumatic.     Mouth/Throat:     Mouth: Mucous membranes are moist.  Eyes:     General: Vision grossly intact. Gaze aligned appropriately.     Extraocular Movements: Extraocular movements intact.     Conjunctiva/sclera: Conjunctivae normal.  Cardiovascular:     Rate and Rhythm: Normal rate and regular rhythm.     Pulses: Normal pulses.     Heart sounds: Normal heart sounds, S1 normal and S2 normal. No murmur heard.    No friction rub. No gallop.  Pulmonary:     Effort: Pulmonary effort is normal. No respiratory distress.     Breath sounds: Normal breath sounds.  Abdominal:  General: Bowel sounds are normal.     Palpations: Abdomen is soft.     Tenderness: There is no abdominal tenderness. There is no guarding or rebound.     Hernia: No hernia is present.  Musculoskeletal:        General: No swelling.     Cervical back: Full passive range of motion without pain, normal range of motion and neck supple. No spinous process tenderness or muscular tenderness. Normal range of motion.     Right lower leg: No edema.     Left lower leg: No edema.  Skin:    General: Skin is warm and dry.     Capillary Refill: Capillary refill takes less than 2 seconds.     Findings: No ecchymosis, erythema, rash or wound.     Comments: Surgical staples intact frontal  scalp, no drainage, erythema  Neurological:     General: No focal deficit present.     Mental Status: She is alert and oriented to person, place, and time.     GCS: GCS eye subscore is 4. GCS verbal subscore is 5. GCS motor subscore is 6.     Cranial Nerves: Cranial nerves 2-12 are intact.     Sensory: Sensation is intact.     Motor: Motor function is intact.     Coordination: Coordination is intact.  Psychiatric:        Attention and Perception: Attention normal.        Mood and Affect: Mood normal.        Speech: Speech normal.        Behavior: Behavior normal.     ED Results / Procedures / Treatments   Labs (all labs ordered are listed, but only abnormal results are displayed) Labs Reviewed  CBC WITH DIFFERENTIAL/PLATELET - Abnormal; Notable for the following components:      Result Value   Platelets 467 (*)    Lymphs Abs 4.2 (*)    All other components within normal limits  BASIC METABOLIC PANEL - Abnormal; Notable for the following components:   Potassium 3.0 (*)    CO2 16 (*)    Glucose, Bld 120 (*)    Anion gap 20 (*)    All other components within normal limits  RAPID URINE DRUG SCREEN, HOSP PERFORMED    EKG EKG Interpretation Date/Time:  Wednesday September 28 2022 04:27:10 EDT Ventricular Rate:  88 PR Interval:  163 QRS Duration:  93 QT Interval:  403 QTC Calculation: 488 R Axis:   69  Text Interpretation: Sinus rhythm RSR' in V1 or V2, probably normal variant ST elev, probable normal early repol pattern Borderline prolonged QT interval Confirmed by Gilda Crease [phone]) on 09/28/2022 4:38:51 AM  Radiology CT HEAD WO CONTRAST ( )  Result Date: 09/28/2022 CLINICAL DATA:  Mental status change with unknown cause EXAM: CT HEAD WITHOUT CONTRAST TECHNIQUE: Contiguous axial images were obtained from the base of the skull through the vertex without intravenous contrast. RADIATION DOSE REDUCTION: This exam was performed according to the departmental  dose-optimization program which includes automated exposure control, adjustment of the mA and/or kV according to patient size and/or use of iterative reconstruction technique. COMPARISON:  Head CT from 5 days prior FINDINGS: Brain: Interval liquefaction of hemorrhage beneath a right craniotomy flap. There is also more distended appearance of fluid and gas collection external to the same bone flap. Thickness spanning theflap is 4.1 cm and the anterior to posterior span is ~ 5.5 cm. There is mass effect  on the underlying frontal lobe but the frontal lobe is low density with volume loss, midline shift is still towards the right. No evidence of interval infarct. No hydrocephalus. 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 Skull: Right perianal craniotomy as described. Sinuses/Orbits: No acute finding. IMPRESSION: Liquefaction and expansion of nonacute hematoma beneath the right craniotomy flap. Also, there is mild increase in scalp fluid collection external to the bone flap. Despite this change there is still ipsilateral midline shift in the setting of prior right MCA infarct with volume loss. Electronically Signed   By: Tiburcio Pea M.D.   On: 09/28/2022 05:43    Procedures Procedures    Medications Ordered in ED Medications  levETIRAcetam (KEPPRA) IVPB 1000 mg/100 mL premix (0 mg Intravenous Stopped 09/28/22 0608)    ED Course/ Medical Decision Making/ A&P                                 Medical Decision Making Amount and/or Complexity of Data Reviewed External Data Reviewed: labs, radiology, ECG and notes. Labs: ordered. Decision-making details documented in ED Course. Radiology: ordered and independent interpretation performed. Decision-making details documented in ED Course. ECG/medicine tests: ordered and independent interpretation performed. Decision-making details documented in ED Course.  Risk Prescription drug management.   Differential diagnosis considered  includes, but not limited to: Seizure; CNS infection; intracranial bleed  Presents to the emergency department from home.  Mother called EMS secondary to a seizure.  Patient had a first-time seizure several days ago, was seen in the ED.  She was to be admitted but then opted to be discharged.  She is not on antiepileptics.  EMS found the patient to be confused and what seemed to be postictal.  On arrival to the ER she is awake and interacting.  She was initially declining intervention.  She did consent to a CT scan.  While in the CT scanner she had a seizure.  She had generalized tonic-clonic activity, shaking of the lower extremities, rigidity and extension of her arms with loss of consciousness.  This lasted 30 to 40 seconds and then she had a postictal state.  This is consistent with a seizure.  Cussed with Dr. Derry Lory, neurology.  Recommends observation on the medical service.  Loaded with Keppra 20 mg/kg IV and initiate Keppra 500 twice daily.  If there are no breakthrough seizures, she can be discharged.  Repeat CT without significant change from prior.  CRITICAL CARE Performed by: Gilda Crease   Total critical care time: 30 minutes  Critical care time was exclusive of separately billable procedures and treating other patients.  Critical care was necessary to treat or prevent imminent or life-threatening deterioration.  Critical care was time spent personally by me on the following activities: development of treatment plan with patient and/or surrogate as well as nursing, discussions with consultants, evaluation of patient's response to treatment, examination of patient, obtaining history from patient or surrogate, ordering and performing treatments and interventions, ordering and review of laboratory studies, ordering and review of radiographic studies, pulse oximetry and re-evaluation of patient's condition.         Final Clinical Impression(s) / ED Diagnoses Final  diagnoses:  Seizure [facility])    Rx / DC Orders ED Discharge Orders     None         , Canary Brim, MD 09/28/22 0630

## 2022-09-28 NOTE — Progress Notes (Signed)
Patient requesting to leave AMA (against medical advice). Writer notified Skip Mayer, MD. MD assessed patient at bedside. [name] mother is at the bedside. Safety precautions in place. Call bell within reach. Bed in lowest position and locked.

## 2022-09-28 NOTE — Progress Notes (Signed)
Patient admitted to 3W room 31. Patient transported by ED RN. SBAR/chart reviewed prior to patient arrival. [name] mother is at the bedside. [name] IVF infusing and potassium, per order. Patient awakens to verbal stimuli. Patient placed on unit telemetry/cardiac monitor, per order. VSS. Respirations are even and unlabored. NAD. RA. Patient denies CP or SOB. Virtual RN aware of patient's admission. Safety precaution maintained. Seizure precautions initiated, per order.

## 2022-09-28 NOTE — ED Notes (Signed)
ED TO INPATIENT HANDOFF REPORT  ED Nurse Name and Phone #: elizabeth [phone]  S Name/Age/Gender Kristin Simmons 37 y.o. female Room/Bed: 019C/019C  Code Status   Code Status: Full Code  Home/SNF/Other Home Patient oriented to: self, place, time, and situation Is this baseline? Yes   Triage Complete: Triage complete  Chief Complaint Seizure disorder [facility]) [G40.909]  Triage Note EMS was called out for a seizure witnessed by her daughter. Patient had a brain surgery about 2 weeks ago. Arrives with surgical staples to the right side of her head. Denies any pain. Patient has been taking tylenol for pain. Patient arrives today with a second seizure since surgery.    Allergies Allergies  Allergen Reactions   Latex Itching and Rash    Level of Care/Admitting Diagnosis ED Disposition     ED Disposition  Admit   Condition  --   Comment  Hospital Area: [facility] [[id number]]  Level of Care: Progressive [102]  Admit to Progressive based on following criteria: NEUROLOGICAL AND NEUROSURGICAL complex patients with significant risk of instability, who do not meet ICU criteria, yet require close observation or frequent assessment (< / = every 2 - 4 hours) with medical / nursing intervention.  May admit patient to [facility] or [facility] if equivalent level of care is available:: No  Covid Evaluation: Recent COVID positive no isolation required infection day 21-90  Diagnosis: Seizure disorder [facility]) [[id number]]  Admitting Physician: Lurline Del [[id number]]  Attending Physician: Lurline Del [[id number]]  Certification:: I certify this patient will need inpatient services for at least 2 midnights          B Medical/Surgery History Past Medical History:  Diagnosis Date   Anemia    Anxiety    Arthritis    Asthma    Cancer (HCC)    cervical   Depression    Headache    Hypertension    She was taking her mother's blood pressure meds but states she  doesn't need bp meds now.   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 WITH PLACEMENT OF BONEFLAP IN ABDOMEN;  Surgeon: Lisbeth Renshaw, MD;  Location: [facility] OR;  Service: Neurosurgery;  Laterality: Right;   IR 3D INDEPENDENT WKST  03/08/2022   IR ANGIO INTRA EXTRACRAN SEL INTERNAL CAROTID BILAT MOD SED  03/08/2022   IR ANGIO VERTEBRAL SEL VERTEBRAL BILAT MOD SED  03/08/2022   IR ANGIOGRAM FOLLOW UP STUDY  03/08/2022   IR ANGIOGRAM FOLLOW UP STUDY  03/08/2022   IR ANGIOGRAM FOLLOW UP STUDY  03/08/2022   IR NEURO EACH ADD'L AFTER BASIC UNI RIGHT (MS)  03/08/2022   IR TRANSCATH/EMBOLIZ  03/08/2022   RADIOLOGY WITH ANESTHESIA N/A 03/08/2022   Procedure: Diagnostic angiogram, Possible Coil Embolization of Aneurysm;  Surgeon: Lisbeth Renshaw, MD;  Location: [facility] OR;  Service: Radiology;  Laterality: N/A;   TUBAL LIGATION Bilateral 03/27/2018   Procedure: POST PARTUM TUBAL LIGATION;  Surgeon: Levie Heritage, DO;  [facility];  Service: Gynecology;  Laterality: Bilateral;     A IV Location/Drains/Wounds Patient Lines/Drains/Airways Status     Active Line/Drains/Airways     Name Placement date  Placement time Site Days   Peripheral IV 09/28/22 20 G Left Forearm 09/28/22  0516  Forearm  less than 1            Intake/Output Last 24 hours  Intake/Output Summary (Last 24 hours) at 09/28/2022 0855 Last data filed at 09/28/2022 0608 Gross per 24 hour  Intake 100 ml  Output --  Net 100 ml    Labs/Imaging Results for orders placed or performed during the hospital encounter of 09/28/22 (from the past 48 hour(s))  CBC with  Differential/Platelet     Status: Abnormal   Collection Time: 09/28/22  5:15 AM  Result Value Ref Range   WBC 8.1 4.0 - 10.5 K/uL   RBC 4.38 3.87 - 5.11 MIL/uL   Hemoglobin 12.2 12.0 - 15.0 g/dL   HCT 40.9 81.1 - 91.4 %   MCV 88.8 80.0 - 100.0 fL   MCH 27.9 26.0 - 34.0 pg   MCHC 31.4 30.0 - 36.0 g/dL   RDW 78.2 95.6 - 21.3 %   Platelets 467 (H) 150 - 400 K/uL   nRBC 0.0 0.0 - 0.2 %   Neutrophils Relative % 37 %   Neutro Abs 3.0 1.7 - 7.7 K/uL   Lymphocytes Relative 52 %   Lymphs Abs 4.2 (H) 0.7 - 4.0 K/uL   Monocytes Relative 8 %   Monocytes Absolute 0.6 0.1 - 1.0 K/uL   Eosinophils Relative 2 %   Eosinophils Absolute 0.2 0.0 - 0.5 K/uL   Basophils Relative 1 %   Basophils Absolute 0.1 0.0 - 0.1 K/uL   Immature Granulocytes 0 %   Abs Immature Granulocytes 0.01 0.00 - 0.07 K/uL    Comment: Performed at [facility], [address], [place], [place] [address]  Basic metabolic panel     Status: Abnormal   Collection Time: 09/28/22  5:15 AM  Result Value Ref Range   Sodium 138 135 - 145 mmol/L   Potassium 3.0 (L) 3.5 - 5.1 mmol/L   Chloride 102 98 - 111 mmol/L   CO2 16 (L) 22 - 32 mmol/L   Glucose, Bld 120 (H) 70 - 99 mg/dL    Comment: Glucose reference range applies only to samples taken after fasting for at least 8 hours.   BUN 7 6 - 20 mg/dL   Creatinine, Ser 8.46 0.44 - 1.00 mg/dL   Calcium 9.7 8.9 - 96.2 mg/dL   GFR, Estimated >95 >28 mL/min    Comment: (NOTE) Calculated using the CKD-EPI Creatinine Equation (2021)    Anion gap 20 (H) 5 - 15    Comment: ELECTROLYTES REPEATED TO VERIFY Performed at [facility], [address] [place], [place] [address]    CT HEAD WO CONTRAST ( )  Result Date: 09/28/2022 CLINICAL DATA:  Mental status change with unknown cause EXAM: CT HEAD WITHOUT CONTRAST TECHNIQUE: Contiguous axial images were obtained from the base of the skull through the vertex without intravenous contrast. RADIATION DOSE REDUCTION: This exam was  performed according to the departmental dose-optimization program which includes automated exposure control, adjustment of the mA and/or kV according to patient size and/or use of iterative reconstruction technique. COMPARISON:  Head CT from 5 days prior FINDINGS: Brain: Interval liquefaction of hemorrhage beneath a right craniotomy flap. There is also more distended appearance of fluid and gas collection external to the same bone flap. Thickness spanning theflap is 4.1 cm and the anterior to posterior span is ~ 5.5 cm. There is mass effect on  the underlying frontal lobe but the frontal lobe is low density with volume loss, midline shift is still towards the right. No evidence of interval infarct. No hydrocephalus. 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 Skull: Right perianal craniotomy as described. Sinuses/Orbits: No acute finding. IMPRESSION: Liquefaction and expansion of nonacute hematoma beneath the right craniotomy flap. Also, there is mild increase in scalp fluid collection external to the bone flap. Despite this change there is still ipsilateral midline shift in the setting of prior right MCA infarct with volume loss. Electronically Signed   By: Tiburcio Pea M.D.   On: 09/28/2022 05:43    Pending Labs Unresulted Labs (From admission, onward)     Start     Ordered   09/29/22 0500  CBC  Tomorrow morning,   R        09/28/22 0820   09/29/22 0500  Comprehensive metabolic panel  Tomorrow morning,   R        09/28/22 0820   09/29/22 0500  Protime-INR  Tomorrow morning,   R        09/28/22 0820   09/28/22 0821  Urinalysis, Routine w reflex microscopic -Urine, Clean Catch  Once,   R       Question:  Specimen Source  Answer:  Urine, Clean Catch   09/28/22 0820   09/28/22 0749  Magnesium  Once,   R        09/28/22 0749   09/28/22 0615  Rapid urine drug screen (hospital performed)  ONCE - STAT,   STAT        09/28/22 0614            Vitals/Pain Today's Vitals    09/28/22 0700 09/28/22 0715 09/28/22 0730 09/28/22 0758  BP: 121/84 119/81    Pulse: 73 73 66   Resp: 17 (!) 21 (!) 23   Temp:    97.7 F (36.5 C)  TempSrc:    Axillary  SpO2: 98% 97% 98%   PainSc:    Asleep    Isolation Precautions No active isolations  Medications Medications  0.9 %  sodium chloride infusion (has no administration in time range)  acetaminophen (TYLENOL) tablet 650 mg (has no administration in time range)    Or  acetaminophen (TYLENOL) suppository 650 mg (has no administration in time range)  ondansetron (ZOFRAN) tablet 4 mg (has no administration in time range)    Or  ondansetron (ZOFRAN) injection 4 mg (has no administration in time range)  albuterol (PROVENTIL) (2.5 MG/3ML) 0.083% nebulizer solution 2.5 mg (has no administration in time range)  levETIRAcetam (KEPPRA) IVPB 500 mg/100 mL premix (0 mg Intravenous Stopped 09/28/22 0809)  LORazepam (ATIVAN) injection 2 mg (has no administration in time range)  potassium chloride 10 mEq in 100 mL IVPB (10 mEq Intravenous New Bag/Given 09/28/22 0812)  levETIRAcetam (KEPPRA) IVPB 1000 mg/100 mL premix (0 mg Intravenous Stopped 09/28/22 1610)    Mobility walks with person assist     Focused Assessments Neuro Assessment Handoff:  Swallow screen pass?  N/a         Neuro Assessment:   Neuro Checks:      Has TPA been given? No If patient is a Neuro Trauma and patient is going to OR before floor call report to 4N Charge nurse: [phone] or [phone]   R Recommendations: See Admitting Provider Note  Report given to:   Additional Notes:  [place], lethargic

## 2022-09-28 NOTE — Plan of Care (Signed)
  Problem: Pain Managment: Goal: General experience of comfort will improve Outcome: Progressing   Problem: Safety: Goal: Ability to remain free from injury will improve Outcome: Progressing   

## 2022-09-28 NOTE — ED Notes (Signed)
ED TO INPATIENT HANDOFF REPORT  ED Nurse Name and Phone #:  Marisue Ivan [phone]  V Name/Age/Gender Kristin Simmons 37 y.o. female Room/Bed: 043C/043C  Code Status   Code Status: Full Code  Home/SNF/Other Home Patient oriented to: self, place, and situation Is this baseline? Yes   Triage Complete: Triage complete  Chief Complaint Seizure disorder [facility]) [G40.909]  Triage Note EMS was called out for a seizure witnessed by her daughter. Patient had a brain surgery about 2 weeks ago. Arrives with surgical staples to the right side of her head. Denies any pain. Patient has been taking tylenol for pain. Patient arrives today with a second seizure since surgery.    Allergies Allergies  Allergen Reactions   Latex Itching and Rash    Level of Care/Admitting Diagnosis ED Disposition     ED Disposition  Admit   Condition  --   Comment  Hospital Area: [facility] [[id number]]  Level of Care: Progressive [102]  Admit to Progressive based on following criteria: NEUROLOGICAL AND NEUROSURGICAL complex patients with significant risk of instability, who do not meet ICU criteria, yet require close observation or frequent assessment (< / = every 2 - 4 hours) with medical / nursing intervention.  May admit patient to [facility] if equivalent level of care is available:: No  Covid Evaluation: Recent COVID positive no isolation required infection day 21-90  Diagnosis: Seizure disorder [facility]) [[id number]]  Admitting Physician: Lurline Del [[id number]]  Attending Physician: Lurline Del [[id number]]  Certification:: I certify this patient will need inpatient services for at least 2 midnights          B Medical/Surgery History Past Medical History:  Diagnosis Date   Anemia    Anxiety    Arthritis    Asthma    Cancer (HCC)    cervical   Depression    Headache    Hypertension    She was taking her mother's blood pressure meds but states she doesn't need  bp meds now.   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 WITH PLACEMENT OF BONEFLAP IN ABDOMEN;  Surgeon: Lisbeth Renshaw, MD;  Location: [facility] OR;  Service: Neurosurgery;  Laterality: Right;   IR 3D INDEPENDENT WKST  03/08/2022   IR ANGIO INTRA EXTRACRAN SEL INTERNAL CAROTID BILAT MOD SED  03/08/2022   IR ANGIO VERTEBRAL SEL VERTEBRAL BILAT MOD SED  03/08/2022   IR ANGIOGRAM FOLLOW UP STUDY  03/08/2022   IR ANGIOGRAM FOLLOW UP STUDY  03/08/2022   IR ANGIOGRAM FOLLOW UP STUDY  03/08/2022   IR NEURO EACH ADD'L AFTER BASIC UNI RIGHT (MS)  03/08/2022   IR TRANSCATH/EMBOLIZ  03/08/2022   RADIOLOGY WITH ANESTHESIA N/A 03/08/2022   Procedure: Diagnostic angiogram, Possible Coil Embolization of Aneurysm;  Surgeon: Lisbeth Renshaw, MD;  Location: [facility] OR;  Service: Radiology;  Laterality: N/A;   TUBAL LIGATION Bilateral 03/27/2018   Procedure: POST PARTUM TUBAL LIGATION;  Surgeon: Levie Heritage, DO;  [facility];  Service: Gynecology;  Laterality: Bilateral;     A IV Location/Drains/Wounds Patient Lines/Drains/Airways Status     Active Line/Drains/Airways     Name Placement date  Placement time Site Days   Peripheral IV 09/28/22 20 G Left Forearm 09/28/22  0516  Forearm  less than 1            Intake/Output Last 24 hours  Intake/Output Summary (Last 24 hours) at 09/28/2022 1024 Last data filed at 09/28/2022 0608 Gross per 24 hour  Intake 100 ml  Output --  Net 100 ml    Labs/Imaging Results for orders placed or performed during the hospital encounter of 09/28/22 (from the past 48 hour(s))  CBC with Differential/Platelet      Status: Abnormal   Collection Time: 09/28/22  5:15 AM  Result Value Ref Range   WBC 8.1 4.0 - 10.5 K/uL   RBC 4.38 3.87 - 5.11 MIL/uL   Hemoglobin 12.2 12.0 - 15.0 g/dL   HCT 74.2 59.5 - 63.8 %   MCV 88.8 80.0 - 100.0 fL   MCH 27.9 26.0 - 34.0 pg   MCHC 31.4 30.0 - 36.0 g/dL   RDW 75.6 43.3 - 29.5 %   Platelets 467 (H) 150 - 400 K/uL   nRBC 0.0 0.0 - 0.2 %   Neutrophils Relative % 37 %   Neutro Abs 3.0 1.7 - 7.7 K/uL   Lymphocytes Relative 52 %   Lymphs Abs 4.2 (H) 0.7 - 4.0 K/uL   Monocytes Relative 8 %   Monocytes Absolute 0.6 0.1 - 1.0 K/uL   Eosinophils Relative 2 %   Eosinophils Absolute 0.2 0.0 - 0.5 K/uL   Basophils Relative 1 %   Basophils Absolute 0.1 0.0 - 0.1 K/uL   Immature Granulocytes 0 %   Abs Immature Granulocytes 0.01 0.00 - 0.07 K/uL    Comment: Performed at [facility], [address] [place], [place] [address]  Basic metabolic panel     Status: Abnormal   Collection Time: 09/28/22  5:15 AM  Result Value Ref Range   Sodium 138 135 - 145 mmol/L   Potassium 3.0 (L) 3.5 - 5.1 mmol/L   Chloride 102 98 - 111 mmol/L   CO2 16 (L) 22 - 32 mmol/L   Glucose, Bld 120 (H) 70 - 99 mg/dL    Comment: Glucose reference range applies only to samples taken after fasting for at least 8 hours.   BUN 7 6 - 20 mg/dL   Creatinine, Ser 6.60 0.44 - 1.00 mg/dL   Calcium 9.7 8.9 - 63.0 mg/dL   GFR, Estimated >16 >01 mL/min    Comment: (NOTE) Calculated using the CKD-EPI Creatinine Equation (2021)    Anion gap 20 (H) 5 - 15    Comment: ELECTROLYTES REPEATED TO VERIFY Performed at [facility], [address], [place], [place] [address]   Magnesium     Status: Abnormal   Collection Time: 09/28/22  5:15 AM  Result Value Ref Range   Magnesium 1.5 (L) 1.7 - 2.4 mg/dL    Comment: Performed at [facility], [address], [place], [place] [address]   CT HEAD WO CONTRAST ( )  Result Date: 09/28/2022 CLINICAL DATA:  Mental status change with unknown  cause EXAM: CT HEAD WITHOUT CONTRAST TECHNIQUE: Contiguous axial images were obtained from the base of the skull through the vertex without intravenous contrast. RADIATION DOSE REDUCTION: This exam was performed according to the departmental dose-optimization program which includes automated exposure control, adjustment of the mA and/or kV according to patient size and/or use of iterative reconstruction technique. COMPARISON:  Head CT from 5 days prior FINDINGS: Brain:  Interval liquefaction of hemorrhage beneath a right craniotomy flap. There is also more distended appearance of fluid and gas collection external to the same bone flap. Thickness spanning theflap is 4.1 cm and the anterior to posterior span is ~ 5.5 cm. There is mass effect on the underlying frontal lobe but the frontal lobe is low density with volume loss, midline shift is still towards the right. No evidence of interval infarct. No hydrocephalus. 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 Skull: Right perianal craniotomy as described. Sinuses/Orbits: No acute finding. IMPRESSION: Liquefaction and expansion of nonacute hematoma beneath the right craniotomy flap. Also, there is mild increase in scalp fluid collection external to the bone flap. Despite this change there is still ipsilateral midline shift in the setting of prior right MCA infarct with volume loss. Electronically Signed   By: Tiburcio Pea M.D.   On: 09/28/2022 05:43    Pending Labs Unresulted Labs (From admission, onward)     Start     Ordered   09/29/22 0500  CBC  Tomorrow morning,   R        09/28/22 0820   09/29/22 0500  Comprehensive metabolic panel  Tomorrow morning,   R        09/28/22 0820   09/29/22 0500  Protime-INR  Tomorrow morning,   R        09/28/22 0820   09/28/22 0821  Urinalysis, Routine w reflex microscopic -Urine, Clean Catch  Once,   R       Question:  Specimen Source  Answer:  Urine, Clean Catch   09/28/22 0820   09/28/22 0615   Rapid urine drug screen (hospital performed)  ONCE - STAT,   STAT        09/28/22 0614            Vitals/Pain Today's Vitals   09/28/22 0730 09/28/22 0758 09/28/22 0941 09/28/22 0945  BP:    (!) 139/106  Pulse: 66     Resp: (!) 23   19  Temp:  97.7 F (36.5 C)    TempSrc:  Axillary    SpO2: 98%     PainSc:  Asleep 2      Isolation Precautions No active isolations  Medications Medications  0.9 %  sodium chloride infusion ( Intravenous New Bag/Given 09/28/22 0904)  acetaminophen (TYLENOL) tablet 650 mg (has no administration in time range)    Or  acetaminophen (TYLENOL) suppository 650 mg (has no administration in time range)  ondansetron (ZOFRAN) tablet 4 mg (has no administration in time range)    Or  ondansetron (ZOFRAN) injection 4 mg (has no administration in time range)  albuterol (PROVENTIL) (2.5 MG/3ML) 0.083% nebulizer solution 2.5 mg (has no administration in time range)  levETIRAcetam (KEPPRA) IVPB 500 mg/100 mL premix (0 mg Intravenous Stopped 09/28/22 0809)  LORazepam (ATIVAN) injection 2 mg (has no administration in time range)  potassium chloride 10 mEq in 100 mL IVPB (10 mEq Intravenous New Bag/Given 09/28/22 1006)  levETIRAcetam (KEPPRA) IVPB 1000 mg/100 mL premix (0 mg Intravenous Stopped 09/28/22 1610)    Mobility walks     Focused Assessments Neuro Assessment Handoff:  Swallow screen pass? Yes  Cardiac Rhythm: Normal sinus rhythm       Neuro Assessment: Exceptions to [facility] Neuro Checks:      Has TPA been given? No If patient is a Neuro Trauma and patient is going to OR before floor call report to 4N Charge nurse: [phone] or [phone]  R Recommendations: See Admitting Provider Note  Report given to:   Additional Notes:

## 2023-05-16 ENCOUNTER — Encounter: Payer: Self-pay | Admitting: Neurology

## 2023-05-16 ENCOUNTER — Emergency Department (HOSPITAL_COMMUNITY): Payer: MEDICAID

## 2023-05-16 ENCOUNTER — Other Ambulatory Visit: Payer: Self-pay

## 2023-05-16 ENCOUNTER — Encounter (HOSPITAL_COMMUNITY): Payer: Self-pay

## 2023-05-16 ENCOUNTER — Emergency Department (HOSPITAL_COMMUNITY)
Admission: EM | Admit: 2023-05-16 | Discharge: 2023-05-16 | Discharge status: Against Medical Advice | Payer: MEDICAID | Attending: Emergency Medicine | Admitting: Emergency Medicine

## 2023-05-16 DIAGNOSIS — Z5329 Procedure and treatment not carried out because of patient's decision for other reasons: Secondary | ICD-10-CM | POA: Diagnosis not present

## 2023-05-16 DIAGNOSIS — Z91040 Latex allergy status: Secondary | ICD-10-CM | POA: Diagnosis not present

## 2023-05-16 DIAGNOSIS — G40909 Epilepsy, unspecified, not intractable, without status epilepticus: Secondary | ICD-10-CM | POA: Insufficient documentation

## 2023-05-16 DIAGNOSIS — R569 Unspecified convulsions: Secondary | ICD-10-CM | POA: Diagnosis present

## 2023-05-16 DIAGNOSIS — G9389 Other specified disorders of brain: Secondary | ICD-10-CM

## 2023-05-16 LAB — CBC WITH DIFFERENTIAL/PLATELET
Abs Immature Granulocytes: 0.03 10*3/uL (ref 0.00–0.07)
Basophils Absolute: 0 10*3/uL (ref 0.0–0.1)
Basophils Relative: 0 %
Eosinophils Absolute: 0.1 10*3/uL (ref 0.0–0.5)
Eosinophils Relative: 1 %
HCT: 35.5 % — ABNORMAL LOW (ref 36.0–46.0)
Hemoglobin: 11.7 g/dL — ABNORMAL LOW (ref 12.0–15.0)
Immature Granulocytes: 0 %
Lymphocytes Relative: 18 %
Lymphs Abs: 1.6 10*3/uL (ref 0.7–4.0)
MCH: 27.5 pg (ref 26.0–34.0)
MCHC: 33 g/dL (ref 30.0–36.0)
MCV: 83.5 fL (ref 80.0–100.0)
Monocytes Absolute: 0.4 10*3/uL (ref 0.1–1.0)
Monocytes Relative: 4 %
Neutro Abs: 7 10*3/uL (ref 1.7–7.7)
Neutrophils Relative %: 77 %
Platelets: 357 10*3/uL (ref 150–400)
RBC: 4.25 MIL/uL (ref 3.87–5.11)
RDW: 15.2 % (ref 11.5–15.5)
WBC: 9.2 10*3/uL (ref 4.0–10.5)
nRBC: 0 % (ref 0.0–0.2)

## 2023-05-16 LAB — URINALYSIS, ROUTINE W REFLEX MICROSCOPIC
Bacteria, UA: NONE SEEN
Bilirubin Urine: NEGATIVE
Glucose, UA: NEGATIVE mg/dL
Ketones, ur: 5 mg/dL — AB
Nitrite: NEGATIVE
Protein, ur: 100 mg/dL — AB
Specific Gravity, Urine: 1.016 (ref 1.005–1.030)
pH: 5 (ref 5.0–8.0)

## 2023-05-16 LAB — RAPID URINE DRUG SCREEN, HOSP PERFORMED
Amphetamines: NOT DETECTED
Barbiturates: NOT DETECTED
Benzodiazepines: POSITIVE — AB
Cocaine: NOT DETECTED
Opiates: NOT DETECTED
Tetrahydrocannabinol: NOT DETECTED

## 2023-05-16 LAB — COMPREHENSIVE METABOLIC PANEL WITH GFR
ALT: 8 U/L (ref 0–44)
AST: 19 U/L (ref 15–41)
Albumin: 3.3 g/dL — ABNORMAL LOW (ref 3.5–5.0)
Alkaline Phosphatase: 52 U/L (ref 38–126)
Anion gap: 11 (ref 5–15)
BUN: <5 mg/dL — ABNORMAL LOW (ref 6–20)
CO2: 19 mmol/L — ABNORMAL LOW (ref 22–32)
Calcium: 8.8 mg/dL — ABNORMAL LOW (ref 8.9–10.3)
Chloride: 106 mmol/L (ref 98–111)
Creatinine, Ser: 0.89 mg/dL (ref 0.44–1.00)
GFR, Estimated: >60 mL/min (ref >60)
Glucose, Bld: 116 mg/dL — ABNORMAL HIGH (ref 70–99)
Potassium: 4.1 mmol/L (ref 3.5–5.1)
Sodium: 136 mmol/L (ref 135–145)
Total Bilirubin: 0.7 mg/dL (ref 0.0–1.2)
Total Protein: 6.7 g/dL (ref 6.5–8.1)

## 2023-05-16 LAB — ETHANOL: Alcohol, Ethyl (B): <10 mg/dL (ref <10)

## 2023-05-16 LAB — HCG, SERUM, QUALITATIVE: Preg, Serum: NEGATIVE

## 2023-05-16 LAB — CBG MONITORING, ED: Glucose-Capillary: 106 mg/dL — ABNORMAL HIGH (ref 70–99)

## 2023-05-16 LAB — MAGNESIUM: Magnesium: 1.5 mg/dL — ABNORMAL LOW (ref 1.7–2.4)

## 2023-05-16 MED ORDER — LEVETIRACETAM 500 MG PO TABS: 500.0000 mg | ORAL_TABLET | Freq: Two times a day (BID) | ORAL | Status: DC

## 2023-05-16 MED ORDER — LEVETIRACETAM 500 MG PO TABS: 500.0000 mg | ORAL_TABLET | Freq: Two times a day (BID) | ORAL | 1 refills | Status: DC

## 2023-05-16 MED ORDER — MAG-OXIDE 200 MG PO TABS: 200.0000 mg | ORAL_TABLET | Freq: Two times a day (BID) | ORAL | 0 refills | Status: AC

## 2023-05-16 MED ORDER — LEVETIRACETAM IN NACL 1000 MG/100ML IV SOLN
1000.0000 mg | Freq: Once | INTRAVENOUS | Status: AC
  Administered 2023-05-16: 1000 mg via INTRAVENOUS
  Filled 2023-05-16: qty 100

## 2023-05-16 MED ORDER — MAGNESIUM SULFATE 2 GM/50ML IV SOLN
2.0000 g | Freq: Once | INTRAVENOUS | Status: AC
  Administered 2023-05-16: 2 g via INTRAVENOUS
  Filled 2023-05-16: qty 50

## 2023-05-16 NOTE — Consult Note (Addendum)
 NEUROLOGY CONSULT NOTE   Date of service: May 16, 2023 Patient Name: Kristin Simmons MRN:  [id number] DOB:  1985-09-05 Chief Complaint: "Seizure" Requesting Provider: Dione Booze, MD  History of Present Illness  Kristin Simmons is a 38 y.o. 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  She was given 5 mg of Versed IM.  In the ER, she was given a load of Keppra 1 g IV x 1.  She was found to have hypomagnesemia, which is being repleted.  She remains very somnolent even after 3 to 4 hours of arrival for which she is being admitted to the hospitalist and neurology was consulted for recommendations on antiepileptics. She is not able to provide any history Significant other is at bedside-he provides some history.  He has recently been released from the prison and has been back with her.    ROS  Unable to ascertain due to her mentation  Past History   Past Medical History:  Diagnosis Date   Anemia    Anxiety    Arthritis    Asthma    Cancer (HCC)    cervical   Depression    Headache    Hypertension    She was taking her mother's blood pressure meds but states she doesn't need bp meds now.   Vaginal Pap smear, abnormal     Past Surgical History:  Procedure Laterality Date   CRANIOPLASTY Right 09/20/2022   Procedure: CRANIOPLASTY, HARVEST OF ABDOMINAL BONE FLAP;  Surgeon: Lisbeth Renshaw, MD;  Location: [facility] OR;  Service: Neurosurgery;  Laterality: Right;   CRANIOTOMY Right 05/31/2022   Procedure: RIGHT  PTERIONAL CRANIOTOMY FOR CLIPPING OF MIDDLE CEREBRAL ARTERY  ANEURYSM WITH PLACEMENT OF BONEFLAP IN ABDOMEN;  Surgeon: Lisbeth Renshaw, MD;  Location: [facility] OR;  Service: Neurosurgery;  Laterality: Right;   IR 3D INDEPENDENT WKST  03/08/2022   IR ANGIO INTRA EXTRACRAN SEL INTERNAL CAROTID BILAT MOD SED  03/08/2022   IR ANGIO VERTEBRAL SEL VERTEBRAL BILAT MOD SED  03/08/2022   IR ANGIOGRAM FOLLOW UP STUDY  03/08/2022   IR ANGIOGRAM FOLLOW UP STUDY  03/08/2022   IR ANGIOGRAM FOLLOW UP STUDY  03/08/2022   IR NEURO EACH ADD'L AFTER BASIC UNI RIGHT (MS)  03/08/2022   IR TRANSCATH/EMBOLIZ  03/08/2022   RADIOLOGY WITH ANESTHESIA N/A 03/08/2022   Procedure: Diagnostic angiogram, Possible Coil Embolization of Aneurysm;  Surgeon: Lisbeth Renshaw, MD;  Location: [facility] OR;  Service: Radiology;  Laterality: N/A;   TUBAL LIGATION Bilateral 03/27/2018   Procedure: POST PARTUM TUBAL LIGATION;  Surgeon: Levie Heritage, DO;  Location: [facility];  Service: Gynecology;  Laterality: Bilateral;    Family History: Family History  Problem Relation Age of Onset   Alzheimer's disease Paternal Grandmother    Cancer Maternal Grandmother    Cancer Maternal Grandfather    Hypertension Father    Anemia Mother    Hypertension Mother    Thyroid disease Mother    Diabetes Sister    Hypertension Sister    Mental illness Brother  Asthma Daughter    Bronchitis Daughter    Asthma Daughter    Bronchitis Daughter    Asthma Daughter    Bronchitis Daughter     Social History  reports that she has been smoking cigarettes. She has a 7.5 pack-year smoking history. She has never used smokeless tobacco. She reports current alcohol use of about 14.0 standard drinks of alcohol per week. She reports that she does not currently use drugs.  Allergies  Allergen Reactions   Latex Itching and Rash    Medications  No current facility-administered medications for this encounter.  Current Outpatient Medications:     acetaminophen (TYLENOL) 325 MG tablet, Take 2 tablets (650 mg total) by mouth every 4 (four) hours as needed for mild pain (temp > 100.5)., Disp: , Rfl:    Lactobacillus (ACIDOPHILUS) CAPS capsule, Take 1 capsule by mouth with breakfast, with lunch, and with evening meal. (Patient not taking: Reported on 09/14/2022), Disp: 100 capsule, Rfl: 0   amLODipine (NORVASC) 10 MG tablet, Take 1 tablet (10 mg total) by mouth daily. (Patient not taking: Reported on 09/14/2022), Disp: 30 tablet, Rfl: 0   Baclofen 5 MG TABS, Take 1 tablet (5 mg total) by mouth 2 (two) times daily. (Patient not taking: Reported on 09/14/2022), Disp: 60 tablet, Rfl: 0   diclofenac Sodium (VOLTAREN) 1 % GEL, Apply 2 g topically 4 (four) times daily. (Patient not taking: Reported on 09/14/2022), Disp: 200 g, Rfl: 0   folic acid (FOLVITE) 1 MG tablet, Take 1 tablet (1 mg total) by mouth daily. (Patient not taking: Reported on 09/14/2022), Disp: 30 tablet, Rfl: 0   gabapentin (NEURONTIN) 300 MG capsule, Take 1 capsule (300 mg) by mouth twice daily and 2 capsules (600 mg) nightly. (Patient not taking: Reported on 09/23/2022), Disp: 120 capsule, Rfl: 0   HYDROcodone-acetaminophen (NORCO/VICODIN) 5-325 MG tablet, Take 1 tablet by mouth every 6 (six) hours as needed for moderate pain or severe pain. (Patient not taking: Reported on 09/28/2022), Disp: , Rfl:    lidocaine (LIDODERM) 5 %, Place 3 patches onto the skin daily. (Patient not taking: Reported on 09/14/2022), Disp: 30 patch, Rfl: 0   polyethylene glycol (MIRALAX / GLYCOLAX) 17 g packet, Take 17 g by mouth daily. (Patient not taking: Reported on 09/14/2022), Disp: 14 each, Rfl: 0   traZODone (DESYREL) 100 MG tablet, Take 1 tablet (100 mg total) by mouth at bedtime as needed for sleep. (Patient not taking: Reported on 09/14/2022), Disp: 30 tablet, Rfl: 0  Vitals   Vitals:   05/16/23 0139 05/16/23 0140 05/16/23 0245  BP: (!) 145/97  (!) 124/92  Pulse: (!) 123  100  Resp: (!) 32  (!) 23   Temp: (!) 96.3 F (35.7 C)    TempSrc: Temporal    SpO2: 95%  99%  Height:  5\' 5"  (1.651 m)     Body mass index is 18.12 kg/m.  Physical Exam  General: Well-developed well-nourished woman in no acute distress sleeping in bed HEENT: Right hemispheric scar from the craniotomy and cranioplasty, otherwise unremarkable.  Swollen lip-question tongue bite/lip bite. CVS: Regular rate rhythm Respiratory: Breathing well and saturating normally on room air Neurological exam Obtunded Only moans to noxious stimulation Does not follow commands Nonverbal Cranial nerves: Pupils are equal round reactive to light, gaze is midline, oculocephalic reflexes are present, no gaze preference or deviation, mild facial asymmetry with left nasolabial fold flattening at rest. Motor examination with mild increased tone and minimal withdrawal to noxious stimulation in  left upper extremity.  She is able to raise the right upper extremity, right lower extremity and left lower extremity after some coaching antigravity for 10 and 5 seconds. Sensation: Grimaces and withdraws all 4 to noxious stimulation-weakest in the left upper extremity. Coordination cannot be assessed given her mentation.  Labs/Imaging/Neurodiagnostic studies   CBC:  Recent Labs  Lab 06-07-23 0237  WBC 9.2  NEUTROABS 7.0  HGB 11.7*  HCT 35.5*  MCV 83.5  PLT 357   Basic Metabolic Panel:  Lab Results  Component Value Date   NA 136 06/07/23   K 4.1 Jun 07, 2023   CO2 19 (L) 2023-06-07   GLUCOSE 116 (H) 06/07/23   BUN <5 (L) 07-Jun-2023   CREATININE 0.89 06-07-2023   CALCIUM 8.8 (L) 06-07-2023   GFRNONAA >60 2023-06-07   GFRAA >60 03/26/2018   Magnesium 1.5  HgbA1c:  Lab Results  Component Value Date   HGBA1C 5.1 06/02/2022   Urine Drug Screen:     Component Value Date/Time   LABOPIA NONE DETECTED 06/07/23 0156   COCAINSCRNUR NONE DETECTED 06/07/2023 0156   LABBENZ POSITIVE (A) 2023/06/07 0156   AMPHETMU NONE DETECTED  Jun 07, 2023 0156   THCU NONE DETECTED 06/07/23 0156   LABBARB NONE DETECTED 2023/06/07 0156    Alcohol Level     Component Value Date/Time   ETH <10 2023-06-07 0237   CT Head without contrast(Personally reviewed): Temporal evolution of the encephalomalacia in the right frontal and temporal lobes deep to the craniotomy flap compared to 09/28/2022.  No acute abnormality.  ASSESSMENT   Kristin Simmons is a 38 y.o. female prior medical history of hypertension, multiple cerebral aneurysms status post clipping and aneurysm surgery complicated by ruptured aneurysm followed by subarachnoid hemorrhage and left hemiparesis from a right MCA infarct with a large area of encephalomalacia in the right frontal and temporal lobes presenting with multiple seizures.  According to the significant other, her antiepileptics were stopped a few days ago-the prescribing physician had stopped giving her refills according to the significant other. She also was mildly hypomagnesemic.  She remains quite obtunded about 3 3-1/2 hours after presentation.  This prolonged postictal state might be also related to her poor brain reserve due to encephalomalacia. Overall I think, likely provoked seizure in the setting of hypomagnesemia and the patient with increased propensity to have seizures due to large area of encephalomalacia.  Impression: Breakthrough seizure in the setting of discontinuation of antiepileptic medication, prolonged postictal state due to poor brain reserve multiple seizures  RECOMMENDATIONS  I would start her back on Keppra 500 twice daily Maintain seizure precautions Routine EEG in the morning.  Consider discharging the patient if the EEG is not suggestive of ongoing seizure activity-but if the routine EEG is concerning for ictal activity, may need prolonged continuous EEG. Replete electrolytes per primary team Neurology will follow along the EEG results with you and will follow up rounding if she is not  returning back to baseline or the EEG shows any abnormality.  Plan was discussed with Dr. Preston Fleeting and relayed to Dr. Loney Loh, medicine hospitalist admitting. ______________________________________________________________________    Signed, Milon Dikes, MD [facility]

## 2023-05-16 NOTE — Discharge Instructions (Addendum)
 You are leaving Against Medical Advice.  We recommended you stay for further treatment and evaluation of your seizures.  It is very important to take your meds as prescribed.  If you develop recurrent seizures, have new or worsening headaches, change your mind about admission, or have any other new/concerning symptoms then return to the ER.  - According to [place] law, you can not drive unless you are seizure / syncope free for at least 6 months and under physician's care.    - Please maintain precautions. Do not participate in activities where a loss of awareness could harm you or someone else. No swimming alone, no tub bathing, no hot tubs, no driving, no operating motorized vehicles (cars, ATVs, motocycles, etc), lawnmowers, power tools or firearms. No standing at heights, such as rooftops, ladders or stairs. Avoid hot objects such as stoves, heaters, open fires. Wear a helmet when riding a bicycle, scooter, skateboard, etc. and avoid areas of traffic. Set your water heater to 120 degrees or less.

## 2023-05-16 NOTE — ED Triage Notes (Signed)
 Patient BIB [facility] from home due to seizure. Patient had witnessed seizure earlier on in the day, however refused to go to the hospital. Patient had another seizure this evening to which EMS was called. EMS witnessed 1 seizure en route to which 5mg  versed IM given. Per EMS patient has remained post ictal. Patient was previously on keppra however was taken off medication 1 week ago. VSS en route. Family at bedside.

## 2023-05-16 NOTE — ED Provider Notes (Signed)
  EMERGENCY DEPARTMENT AT [facility] Provider Note   CSN: [id number] Arrival date & time: 05/16/23  0127     History  Chief Complaint  Patient presents with   Seizures    Kristin Simmons is a 38 y.o. female.  The history is provided by the spouse and the EMS personnel. The history is limited by the condition of the patient (Patient unresponsive).  Seizures She has history of hypertension, asthma, cerebral aneurysm clipping, stroke with left hemiparesis, seizures and comes in following several seizures today.  She had a seizure at about 2 in the afternoon from which she completely recovered, then she had 2 seizures tonight and continues to be unresponsive.  She apparently was taken off of her anticonvulsant 4 days ago.  EMS gave midazolam intramuscularly.   Home Medications Prior to Admission medications   Medication Sig Start Date End Date Taking? Authorizing Provider  acetaminophen (TYLENOL) 325 MG tablet Take 2 tablets (650 mg total) by mouth every 4 (four) hours as needed for mild pain (temp > 100.5). 03/09/22   Lisbeth Renshaw, MD  Lactobacillus (ACIDOPHILUS) CAPS capsule Take 1 capsule by mouth with breakfast, with lunch, and with evening meal. Patient not taking: Reported on 09/14/2022 07/19/22   Angiulli, Mcarthur Rossetti, PA-C  amLODipine (NORVASC) 10 MG tablet Take 1 tablet (10 mg total) by mouth daily. Patient not taking: Reported on 09/14/2022 07/19/22   Angiulli, Mcarthur Rossetti, PA-C  Baclofen 5 MG TABS Take 1 tablet (5 mg total) by mouth 2 (two) times daily. Patient not taking: Reported on 09/14/2022 07/19/22   Angiulli, Mcarthur Rossetti, PA-C  diclofenac Sodium (VOLTAREN) 1 % GEL Apply 2 g topically 4 (four) times daily. Patient not taking: Reported on 09/14/2022 07/19/22   Angiulli, Mcarthur Rossetti, PA-C  folic acid (FOLVITE) 1 MG tablet Take 1 tablet (1 mg total) by mouth daily. Patient not taking: Reported on 09/14/2022 07/19/22   Angiulli, Mcarthur Rossetti, PA-C  gabapentin (NEURONTIN) 300 MG  capsule Take 1 capsule (300 mg) by mouth twice daily and 2 capsules (600 mg) nightly. Patient not taking: Reported on 09/23/2022 07/19/22   Angiulli, Mcarthur Rossetti, PA-C  HYDROcodone-acetaminophen (NORCO/VICODIN) 5-325 MG tablet Take 1 tablet by mouth every 6 (six) hours as needed for moderate pain or severe pain. Patient not taking: Reported on 09/28/2022    [provider]  lidocaine (LIDODERM) 5 % Place 3 patches onto the skin daily. Patient not taking: Reported on 09/14/2022 07/19/22   Angiulli, Mcarthur Rossetti, PA-C  polyethylene glycol (MIRALAX / GLYCOLAX) 17 g packet Take 17 g by mouth daily. Patient not taking: Reported on 09/14/2022 07/19/22   Angiulli, Mcarthur Rossetti, PA-C  traZODone (DESYREL) 100 MG tablet Take 1 tablet (100 mg total) by mouth at bedtime as needed for sleep. Patient not taking: Reported on 09/14/2022 07/19/22   Angiulli, Mcarthur Rossetti, PA-C      Allergies    Latex    Review of Systems   Review of Systems  Unable to perform ROS: Patient unresponsive  Neurological:  Positive for seizures.    Physical Exam Updated Vital Signs BP (!) 145/97 (BP Location: Left Arm)   Pulse (!) 123   Temp (!) 96.3 F (35.7 C) (Temporal)   Resp (!) 32   Ht 5\' 5"  (1.651 m)   SpO2 95%   BMI 18.12 kg/m  Physical Exam Vitals and nursing note reviewed.   38 year old female, minimally responsive to deep painful stimuli but protecting her airway. Vital  signs are significant for elevated heart rate and respiratory rate and blood pressure. Oxygen saturation is 95%, which is normal. Head is normocephalic and atraumatic.  Pupils are 4 mm and sluggishly reactive, eyes deviated to the right. Oropharynx is clear. Neck is supple without adenopathy. Lungs are clear without rales, wheezes, or rhonchi. Chest is nontender. Heart is tachycardic without murmur. Abdomen is soft, flat. Extremities have no cyanosis or edema, full range of motion is present. Skin is warm and dry without rash. Neurologic: Unresponsive  except to deep painful stimuli, eyes deviated to the right but no other focal findings.  ED Results / Procedures / Treatments   Labs (all labs ordered are listed, but only abnormal results are displayed) Labs Reviewed  HCG, SERUM, QUALITATIVE  COMPREHENSIVE METABOLIC PANEL WITH GFR  CBC WITH DIFFERENTIAL/PLATELET  ETHANOL  URINALYSIS, ROUTINE W REFLEX MICROSCOPIC  RAPID URINE DRUG SCREEN, HOSP PERFORMED  MAGNESIUM  CBG MONITORING, ED    EKG EKG Interpretation Date/Time:  Tuesday May 16 2023 02:24:51 EDT Ventricular Rate:  106 PR Interval:  155 QRS Duration:  84 QT Interval:  349 QTC Calculation: 464 R Axis:   73  Text Interpretation: Sinus tachycardia Probable left atrial enlargement RSR' in V1 or V2, probably normal variant Nonspecific T abnormalities, anterior leads When compared with ECG of 09/28/2022, HEART RATE has increased Nonspecific T wave abnormality is now present Confirmed by Dione Booze ([id number]) on 05/16/2023 2:38:40 AM  Radiology No results found.  Procedures Procedures  Cardiac monitor shows sinus tachycardia, per my interpretation.  Medications Ordered in ED Medications  levETIRAcetam (KEPPRA) IVPB 1000 mg/100 mL premix (has no administration in time range)    ED Course/ Medical Decision Making/ A&P                                 Medical Decision Making Amount and/or Complexity of Data Reviewed Labs: ordered. Radiology: ordered.  Risk Prescription drug management. Decision regarding hospitalization.   Seizure in patient with previous stroke and aneurysm clipping and recent discontinuation of anticonvulsants.  Although not having motor activity, she clearly is still having seizure activity with eyes deviated to the right.  I have ordered a loading dose of levetiracetam.  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  I  have ordered routine labs as well as CT angiogram of the head and neck.  I reviewed her laboratory test, my interpretation is not pregnant, elevated random glucose level, mild metabolic acidosis consistent with recent seizure, mild anemia which had been present previously, hypomagnesemia and I have ordered intravenous magnesium 4, normal urinalysis, drug screen positive for benzodiazepines likely secondary to midazolam given in the ambulance.  Chest x-ray shows no active disease.  Have independently viewed the image, and agree with radiologist interpretation.  Patient is now starting to wake up, is able to talk with her significant other.  She no longer has eye deviation but is extremely sleepy and not talking to me.  Patient is refusing CT angiogram and actually wants to go home.  Unfortunately, she still is very sleepy and I do not feel that she is demonstrating that she is able to understand the risks of leaving AGAINST MEDICAL ADVICE.  Since she will not agree to CT angiogram, I am ordering a plain CT which at least would tell me if  she had any hemorrhage.  CT scan shows evolution of encephalomalacia but no evidence of any acute abnormality.  I have reevaluated the patient, and she is still refusing many interventions, but I am not able to engage with her regarding ability to understand risks and benefits of leaving AGAINST MEDICAL ADVICE.  Therefore, I am making arrangements to have her admitted.  I have discussed case with Dr. Loney Loh of [facility], who agrees to admit the patient but requests consultation with neurology.  I have contacted Dr. Wilford Corner of neurology service who agrees to see the patient in consultation.  CRITICAL CARE Performed by: Dione Booze Total critical care time: 60 minutes Critical care time was exclusive of separately billable procedures and treating other patients. Critical care was necessary to treat or prevent imminent or life-threatening deterioration. Critical care  was time spent personally by me on the following activities: development of treatment plan with patient and/or surrogate as well as nursing, discussions with consultants, evaluation of patient's response to treatment, examination of patient, obtaining history from patient or surrogate, ordering and performing treatments and interventions, ordering and review of laboratory studies, ordering and review of radiographic studies, pulse oximetry and re-evaluation of patient's condition.  Final Clinical Impression(s) / ED Diagnoses Final diagnoses:  Seizure (HCC)  Hypomagnesemia    Rx / DC Orders ED Discharge Orders     None         Dione Booze, MD 05/16/23 [phone]

## 2023-05-16 NOTE — ED Provider Notes (Signed)
 I was asked to see the patient as she is requesting discharge. She is currently awake, alert, and oriented to person, place, time and situation. Her significant other is at the bedside.  I strongly recommended she stay as per neurology recommendations.  Discussed she may develop recurrent seizures which can result in permanent neurologic disability and/or death.  She seems to understand all this but still wants to leave AGAINST MEDICAL ADVICE.  Will refill her Keppra prescription as well as give her magnesium supplementation.  I have strongly recommended she stay but have also discussed that she may return at any time and is encouraged to do so.   Pricilla Loveless, MD 05/16/23 [phone]

## 2023-05-16 NOTE — ED Notes (Signed)
Pt requesting to leave. EDP made aware 

## 2023-07-20 ENCOUNTER — Encounter: Payer: Self-pay | Admitting: Neurology

## 2023-07-20 ENCOUNTER — Ambulatory Visit (INDEPENDENT_AMBULATORY_CARE_PROVIDER_SITE_OTHER): Payer: MEDICAID | Admitting: Neurology

## 2023-07-20 VITALS — BP 127/83 | HR 71 | Ht 64.0 in | Wt 105.4 lb

## 2023-07-20 DIAGNOSIS — Z8679 Personal history of other diseases of the circulatory system: Secondary | ICD-10-CM

## 2023-07-20 DIAGNOSIS — I69398 Other sequelae of cerebral infarction: Secondary | ICD-10-CM

## 2023-07-20 DIAGNOSIS — G40209 Localization-related (focal) (partial) symptomatic epilepsy and epileptic syndromes with complex partial seizures, not intractable, without status epilepticus: Secondary | ICD-10-CM

## 2023-07-20 DIAGNOSIS — R252 Cramp and spasm: Secondary | ICD-10-CM

## 2023-07-20 MED ORDER — LEVETIRACETAM 500 MG PO TABS: ORAL_TABLET | ORAL | 11 refills | Status: DC

## 2023-07-20 NOTE — Patient Instructions (Signed)
 Good to meet you.  Increase the Levetiracetam  500mg : take 2 tablets every morning, 2 tablets every evening  2. You can take Tylenol  or Aleve  as needed for headaches  3. Referral will be sent to Dr. 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 Follow-up in 2 months, call for any changes   Seizure Precautions: 1. If medication has been prescribed for you to prevent seizures, take it exactly as directed.  Do not stop taking the medicine without talking to your doctor first, even if you have not had a seizure in a long time.   2. Avoid activities in which a seizure would cause danger to yourself or to others.  Don't operate dangerous machinery, swim alone, or climb in high or dangerous places, such as on ladders, roofs, or girders.  Do not drive unless your doctor says you may.  3. If you have any warning that you may have a seizure, lay down in a safe place where you can't hurt yourself.    4.  No driving for 6 months from last seizure, as per [place]  state law.   Please refer to the following link on the [employer]ile[employer] of America's website for more information: http://www.epilepsyfoundation.org/answerplace/Social/driving/drivingu.cfm   5.  Maintain good sleep hygiene. Avoid alcohol.  6.  Notify your neurology if you are planning pregnancy or if you become pregnant.  7.  Contact your doctor if you have any problems that may be related to the medicine you are taking.  8.  Call 911 and bring the patient back to the ED if:        A.  The seizure lasts longer than 5 minutes.       B.  The patient doesn't awaken shortly after the seizure  C.  The patient has new problems such as difficulty seeing, speaking or moving  D.  The patient was injured during the seizure  E.  The patient has a temperature over 102 F (39C)  F.  The patient vomited and now is having trouble breathing

## 2023-07-20 NOTE — Progress Notes (Signed)
 NEUROLOGY CONSULTATION NOTE  Kristin Simmons MRN: [id number] DOB: 1985-09-15  Referring provider: Dr. Jerilynn Montenegro (ER) Primary care provider: none listed  Reason for consult:  seizure   Dear Dr Aldean Amass:  Thank you for your kind referral of Kristin Simmons for consultation of the above symptoms. Although her history is well known to you, please allow me to reiterate it for the purpose of our medical record. The patient was accompanied to the clinic by her daughter who also provides collateral information. Records and images were personally reviewed where available.   HISTORY OF PRESENT ILLNESS: This is a 38 year old right-handed woman with a complicated history presenting for evaluation of seizures. Records were reviewed. 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 In 04/2022, she underwent elective right craniotomy for clipping of MCA aneurysm, complicated by intraoperative rupture requiring mechanical ventilation. 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 She had overnight EEG at that time for left-sided movements, EEG showed continuous slowing over the right hemisphere. She had residual left upper extremity weakness and was in inpatient rehab from May to June 2024 and started on Neurontin  300mg  in AM, 600mg  in PM (no longer taking). She was in the ER in 09/2022 for rhythmic flapping motion of the left hand, less alert, episode lasted a few seconds. Admission was recommended but she signed out AMA. Five days later, she was brought  back to the ER for seizure, confused on EMS arrival. While in the CT scanner, she had a generalized tonic-clonic seizure lasting 30-40 seconds. She was discharged home on Levetiracetam  500mg  BID. She was in the ER on 05/16/23 after 3 seizures, one witnessed by EMS. Her daughter reports her left hand and left side of face locked up followed by convulsion. Per notes, she was taken off medication 4 days prior/prescribing physician stopped giving refills. Head CT no acute changes, there was encephalomalacia deep to the craniotomy flap in the right frontal and temporal lobes, cortical hyperattenuation in the area of encephalomalacia suggesting laminar necrosis. She was obtunded for 3.5 hours, then once she woke up another 2 hours later, she requested discharged and left AMA with a 2 month supply of Levetiracetam  500mg  BID. She states she ran out a week ago because she would take an additional dose up to 4 times in the day as needed when she feels a seizure described as left arm/hand cramping badly. She denies any loss of consciousness, however her daughter reports she would be unresponsive when her left hand locked up, she thinks the last episode was 1-2 months ago. Last GTC was the one in April. Her daughter denies any isolated staring spells but she endorses losing time several times a day, forgetting a lot.   She denies any olfactory/gustatory hallucinations, rising epigastric sensation, myoclonic jerks. She has numbness on the tip of her fingers on the right hand, left hand also feels numb. She denies any cramping on the right or on her lower extremities. She has frequent right frontal headaches occurring 2-3 times a day. It feels like someone cutting it up. She feels  the LEV also helps with the headaches, makes me sleep. They don't last long, gone when she wakes up. No associated nausea/vomiting. No dizziness, diplopia, dysarthria/dysphagia, neck/back pain, bowel/bladder dysfunction. She has heavy periods. She  gets 7-8 hours of sleep. She has 5 children at home. Mood is fine. She reports she does not have a PCP to prescribe her medications, Amlodipine  is on her medication list, BP today normal 127/83. She reports the left wrist aches but is not currently cramping.   Epilepsy Risk Factors:  right MCA stroke s/p craniotomy. She had a normal birth and early development.  There is no history of febrile convulsions, CNS infections such as meningitis/encephalitis, significant traumatic brain injury, or family history of seizures.   PAST MEDICAL HISTORY: Past Medical History:  Diagnosis Date   Anemia    Anxiety    Arthritis    Asthma    Cancer (HCC)    cervical   Depression    Headache    Hypertension    She was taking her mother's blood pressure meds but states she doesn't need bp meds now.   Vaginal Pap smear, abnormal     PAST SURGICAL HISTORY: Past Surgical History:  Procedure Laterality Date   CRANIOPLASTY Right 09/20/2022   Procedure: CRANIOPLASTY, HARVEST OF ABDOMINAL BONE FLAP;  Surgeon: Augusto Blonder, MD;  Location: [facility] OR;  Service: Neurosurgery;  Laterality: Right;   CRANIOTOMY Right 05/31/2022   Procedure: RIGHT PTERIONAL CRANIOTOMY FOR CLIPPING OF MIDDLE CEREBRAL ARTERY  ANEURYSM WITH PLACEMENT OF BONEFLAP IN ABDOMEN;  Surgeon: Augusto Blonder, MD;  Location: [facility] OR;  Service: Neurosurgery;  Laterality: Right;   IR 3D INDEPENDENT WKST  03/08/2022   IR ANGIO INTRA EXTRACRAN SEL INTERNAL CAROTID BILAT MOD SED  03/08/2022   IR ANGIO VERTEBRAL SEL VERTEBRAL BILAT MOD SED  03/08/2022   IR ANGIOGRAM FOLLOW UP STUDY  03/08/2022   IR ANGIOGRAM FOLLOW UP STUDY  03/08/2022   IR ANGIOGRAM FOLLOW UP STUDY  03/08/2022   IR NEURO EACH ADD'L AFTER BASIC UNI RIGHT (MS)  03/08/2022   IR TRANSCATH/EMBOLIZ  03/08/2022   RADIOLOGY WITH ANESTHESIA N/A 03/08/2022   Procedure: Diagnostic angiogram, Possible Coil Embolization of Aneurysm;  Surgeon: Augusto Blonder, MD;  [facility] OR;  Service:  Radiology;  Laterality: N/A;   TUBAL LIGATION Bilateral 03/27/2018   Procedure: POST PARTUM TUBAL LIGATION;  Surgeon: Malka Sea, DO;  Location: [facility];  Service: Gynecology;  Laterality: Bilateral;    MEDICATIONS: Current Outpatient Medications on File Prior to Visit  Medication Sig Dispense Refill   levETIRAcetam  (KEPPRA ) 500 MG tablet Take 1 tablet (500 mg total) by mouth 2 (two) times daily. (Patient taking differently: Take 500 mg by mouth 2 (two) times daily. Taken as needed she is taken normally 1 in the morning and 3 at night) 60 tablet 1   acetaminophen  (TYLENOL ) 325 MG tablet Take 2 tablets (650 mg total) by mouth every 4 (four) hours as needed for mild pain (temp > 100.5). (Patient not taking: Reported on 07/20/2023)     Lactobacillus (ACIDOPHILUS) CAPS capsule Take 1 capsule by mouth with breakfast, with lunch, and with evening meal. (Patient not taking: Reported on 09/14/2022) 100 capsule 0   amLODipine  (NORVASC ) 10 MG tablet Take 1 tablet (10 mg total) by mouth daily. (Patient not taking: Reported on 09/14/2022) 30 tablet 0   Baclofen  5 MG TABS Take 1 tablet (5 mg total) by mouth 2 (two) times daily. (Patient not taking: Reported on 09/14/2022) 60  tablet 0   diclofenac  Sodium (VOLTAREN ) 1 % GEL Apply 2 g topically 4 (four) times daily. (Patient not taking: Reported on 09/14/2022) 200 g 0   folic acid  (FOLVITE ) 1 MG tablet Take 1 tablet (1 mg total) by mouth daily. (Patient not taking: Reported on 09/14/2022) 30 tablet 0   gabapentin  (NEURONTIN ) 300 MG capsule Take 1 capsule (300 mg) by mouth twice daily and 2 capsules (600 mg) nightly. (Patient not taking: Reported on 09/23/2022) 120 capsule 0   HYDROcodone -acetaminophen  (NORCO/VICODIN) 5-325 MG tablet Take 1 tablet by mouth every 6 (six) hours as needed for moderate pain or severe pain. (Patient not taking: Reported on 09/28/2022)     lidocaine  (LIDODERM ) 5 % Place 3 patches onto the skin daily. (Patient not taking: Reported  on 09/14/2022) 30 patch 0   polyethylene glycol (MIRALAX  / GLYCOLAX ) 17 g packet Take 17 g by mouth daily. (Patient not taking: Reported on 09/14/2022) 14 each 0   traZODone  (DESYREL ) 100 MG tablet Take 1 tablet (100 mg total) by mouth at bedtime as needed for sleep. (Patient not taking: Reported on 09/14/2022) 30 tablet 0   No current facility-administered medications on file prior to visit.    ALLERGIES: Allergies  Allergen Reactions   Latex Itching and Rash    FAMILY HISTORY: Family History  Problem Relation Age of Onset   Alzheimer's disease Paternal Grandmother    Cancer Maternal Grandmother    Cancer Maternal Grandfather    Hypertension Father    Anemia Mother    Hypertension Mother    Thyroid disease Mother    Diabetes Sister    Hypertension Sister    Mental illness Brother    Asthma Daughter    Bronchitis Daughter    Asthma Daughter    Bronchitis Daughter    Asthma Daughter    Bronchitis Daughter     SOCIAL HISTORY: Social History   Socioeconomic History   Marital status: Single    Spouse name: Not on file   Number of children: 5   Years of education: Not on file   Highest education level: Not on file  Occupational History   Not on file  Tobacco Use   Smoking status: Every Day    Current packs/day: 0.50    Average packs/day: 0.5 packs/day for 15.0 years (7.5 ttl pk-yrs)    Types: Cigarettes   Smokeless tobacco: Never  Vaping Use   Vaping status: Some Days   Substances: Nicotine  Substance and Sexual Activity   Alcohol use: Yes    Alcohol/week: 14.0 standard drinks of alcohol    Types: 14 Cans of beer per week    Comment: 2 beers daily (malt liquor)   Drug use: Not Currently   Sexual activity: Yes    Birth control/protection: None  Other Topics Concern   Not on file  Social History Narrative   Are you right handed or left handed? Right    Are you currently employed ? No    What is your current occupation?   Do you live at home alone? No    Who  lives with you? Family    What type of home do you live in: 1 story or 2 story? 1 level        Social Drivers of Corporate investment banker Strain: Not on file  Food Insecurity: No Food Insecurity (09/28/2022)   Hunger Vital Sign    Worried About Running Out of Food in the Last Year: Never true  Ran Out of Food in the Last Year: Never true  Transportation Needs: No Transportation Needs (09/28/2022)   PRAPARE - Administrator, Civil Service (Medical): No    Lack of Transportation (Non-Medical): No  Physical Activity: Not on file  Stress: Not on file  Social Connections: Not on file  Intimate Partner Violence: Not At Risk (09/28/2022)   Humiliation, Afraid, Rape, and Kick questionnaire    Fear of Current or Ex-Partner: No    Emotionally Abused: No    Physically Abused: No    Sexually Abused: No     PHYSICAL EXAM: Vitals:   07/20/23 1007  BP: 127/83  Pulse: 71  SpO2: 99%   General: No acute distress, flat affect Head:  Normocephalic/atraumatic Skin/Extremities: No rash, no edema Neurological Exam: Mental status: alert and awake, no dysarthria or aphasia, Fund of knowledge is appropriate.  Attention and concentration are normal.   Cranial nerves: CN I: not tested CN II: pupils equal, round, visual fields intact CN III, IV, VI:  full range of motion, no nystagmus, no ptosis CN V: facial sensation intact CN VII: upper and lower face symmetric CN VIII: hearing intact to conversation Bulk & Tone: normal, no fasciculations. Motor: 3/5 left arm abduction, unable to lift above shoulder level, she guards the left hand but appears to have 2/5 left wrist/finger extension. 5/5 on right UE and both LE. Sensation: intact to light touch, cold, pin, vibration sense.  No extinction to double simultaneous stimulation.  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 She has focal seizures with left hand ?dystonic posturing progressing to convulsions. Last GTC was in 05/2023, however she reports frequent left hand cramping where she takes additional doses of Levetiracetam . We discussed increasing dose of Levetiracetam  500mg : take 2 tablets BID (1000mg  BID) to hopefully reduce need for mid-day dosing. She was advised she can take Tylenol  or Aleve  as needed for headaches but to minimize to 3 times a week to avoid rebound headaches. She has pain on the left arm with some spasticity and will be referred to PMR for consideration for Botox. She reports there are she has not followed up with Neurosurgery and that there are still staples on her abdomen(?) from the surgery, follow-up request sent to Dr. Cleven [place] office. [place] driving laws were discussed with the patient, and she knows to stop driving after a seizure, until 6 months seizure-free. She was advised to set-up PCP care at the [facility]. Follow-up in 2 months, call for any changes.    Thank you for allowing me to participate in the care of this patient. Please do not hesitate to call for any questions or concerns.   Rayfield Cairo, M.D.  CC: Dr. Aldean Amass

## 2023-07-24 ENCOUNTER — Encounter: Payer: Self-pay | Admitting: Physical Medicine & Rehabilitation

## 2023-08-29 ENCOUNTER — Ambulatory Visit: Payer: MEDICAID | Admitting: Physical Medicine & Rehabilitation

## 2023-09-07 ENCOUNTER — Encounter: Payer: MEDICAID | Admitting: Physical Medicine & Rehabilitation

## 2023-10-09 ENCOUNTER — Encounter: Payer: Self-pay | Admitting: Neurology

## 2023-10-09 ENCOUNTER — Ambulatory Visit: Payer: MEDICAID | Admitting: Neurology

## 2023-10-09 VITALS — BP 113/87 | HR 68 | Resp 20 | Ht 64.0 in | Wt 103.0 lb

## 2023-10-09 DIAGNOSIS — I69398 Other sequelae of cerebral infarction: Secondary | ICD-10-CM | POA: Diagnosis not present

## 2023-10-09 DIAGNOSIS — R252 Cramp and spasm: Secondary | ICD-10-CM

## 2023-10-09 DIAGNOSIS — G40209 Localization-related (focal) (partial) symptomatic epilepsy and epileptic syndromes with complex partial seizures, not intractable, without status epilepticus: Secondary | ICD-10-CM

## 2023-10-09 DIAGNOSIS — Z8679 Personal history of other diseases of the circulatory system: Secondary | ICD-10-CM | POA: Diagnosis not present

## 2023-10-09 MED ORDER — LEVETIRACETAM 500 MG PO TABS: ORAL_TABLET | ORAL | 11 refills | Status: DC

## 2023-10-09 MED ORDER — DICLOFENAC SODIUM 1 % EX GEL: 2.0000 g | Freq: Four times a day (QID) | CUTANEOUS | 0 refills | Status: AC

## 2023-10-09 MED ORDER — DULOXETINE HCL 20 MG PO CPEP: 20.0000 mg | ORAL_CAPSULE | Freq: Every day | ORAL | 6 refills | Status: DC

## 2023-10-09 NOTE — Progress Notes (Unsigned)
 NEUROLOGY FOLLOW UP OFFICE NOTE  Kristin Simmons [id number] May 14, 1985  HISTORY OF PRESENT ILLNESS: I had the pleasure of seeing Kristin Simmons in follow-up in the neurology clinic on 10/09/2023.  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 She is alone in the office today. Records and images were personally reviewed where available. On her last visit, she reported taking an additional dose of Levetiracetam  when her left arm would cramp badly. Levetiracetam  increased to 1000mg  BID. She denies any convulsions since 05/2023. She states she only take an extra dose every once in a blue moon now. She wonders if the diffuse body itching is due to Keppra . A week ago, she would find herself shaking as she wakes up, with the shaking stopping when she wakes up. No tongue bite or incontinence. She continues to report numbness in the left foot, pain in the left hand, arm/shoulder. Sometimes she feels a pinching on the left side of her head. 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 She denies any dizziness, vision changes, no falls. Mood is depressed.    History on Initial Assessment 07/20/2023: This is a 38 year old right-handed woman with a complicated history presenting for evaluation of seizures. Records were reviewed. 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 In 04/2022, she underwent elective right craniotomy for clipping of MCA aneurysm, complicated by intraoperative rupture requiring mechanical ventilation. She developed MCA territory edema with hemorrhagic transformation then had severe vasospasm with evolving large right MCA infarct with herniation, new area of  hypodensity in the right parietal lobe, and rightward midline shift. She had overnight EEG at that time for left-sided movements, EEG showed continuous slowing over the right hemisphere. She had residual left upper extremity weakness and was in inpatient rehab from May to June 2024 and started on Neurontin  300mg  in AM, 600mg  in PM (no longer taking). She was in the ER in 09/2022 for rhythmic flapping motion of the left hand, less alert, episode lasted a few seconds. Admission was recommended but she signed out AMA. Five days later, she was brought back to the ER for seizure, confused on EMS arrival. While in the CT scanner, she had a generalized tonic-clonic seizure lasting 30-40 seconds. She was discharged home on Levetiracetam  500mg  BID. She was in the ER on 05/16/23 after 3 seizures, one witnessed by EMS. Her daughter reports her left hand and left side of face locked up followed by convulsion. Per notes, she was taken off medication 4 days prior/prescribing physician stopped giving refills. Head CT no acute changes, there was encephalomalacia deep to the craniotomy flap in the right frontal and temporal lobes, cortical hyperattenuation in the area of encephalomalacia suggesting laminar necrosis. She was obtunded for 3.5 hours, then once she woke up another 2 hours later, she requested discharged and left AMA with a 2 month supply of Levetiracetam  500mg  BID. She states she ran out a week ago because she would take an additional dose up to 4 times in the day as needed when she feels a seizure described as left arm/hand cramping badly. She denies any loss of consciousness, however her daughter reports she would be unresponsive  when her left hand locked up, she thinks the last episode was 1-2 months ago. Last GTC was the one in April. Her daughter denies any isolated staring spells but she endorses losing time several times a day, forgetting a lot.   She denies any olfactory/gustatory hallucinations, rising  epigastric sensation, myoclonic jerks. She has numbness on the tip of her fingers on the right hand, left hand also feels numb. She denies any cramping on the right or on her lower extremities. She has frequent right frontal headaches occurring 2-3 times a day. It feels like someone cutting it up. She feels the LEV also helps with the headaches, makes me sleep. They don't last long, gone when she wakes up. No associated nausea/vomiting. No dizziness, diplopia, dysarthria/dysphagia, neck/back pain, bowel/bladder dysfunction. She has heavy periods. She gets 7-8 hours of sleep. She has 5 children at home. Mood is fine. She reports she does not have a PCP to prescribe her medications, Amlodipine  is on her medication list, BP today normal 127/83. She reports the left wrist aches but is not currently cramping.   Epilepsy Risk Factors:  right MCA stroke s/p craniotomy. She had a normal birth and early development.  There is no history of febrile convulsions, CNS infections such as meningitis/encephalitis, significant traumatic brain injury, or family history of seizures.   PAST MEDICAL HISTORY: Past Medical History:  Diagnosis Date   Anemia    Anxiety    Arthritis    Asthma    Cancer (HCC)    cervical   Depression    Headache    Hypertension    She was taking her mother's blood pressure meds but states she doesn't need bp meds now.   Vaginal Pap smear, abnormal     MEDICATIONS: Current Outpatient Medications on File Prior to Visit  Medication Sig Dispense Refill   acetaminophen  (TYLENOL ) 325 MG tablet Take 2 tablets (650 mg total) by mouth every 4 (four) hours as needed for mild pain (temp > 100.5). (Patient not taking: Reported on 10/09/2023)     Lactobacillus (ACIDOPHILUS) CAPS capsule Take 1 capsule by mouth with breakfast, with lunch, and with evening meal. (Patient not taking: Reported on 10/09/2023) 100 capsule 0   amLODipine  (NORVASC ) 10 MG tablet Take 1 tablet (10 mg total) by mouth  daily. (Patient not taking: Reported on 10/09/2023) 30 tablet 0   Baclofen  5 MG TABS Take 1 tablet (5 mg total) by mouth 2 (two) times daily. (Patient not taking: Reported on 10/09/2023) 60 tablet 0   diclofenac  Sodium (VOLTAREN ) 1 % GEL Apply 2 g topically 4 (four) times daily. (Patient not taking: Reported on 10/09/2023) 200 g 0   folic acid  (FOLVITE ) 1 MG tablet Take 1 tablet (1 mg total) by mouth daily. (Patient not taking: Reported on 10/09/2023) 30 tablet 0   gabapentin  (NEURONTIN ) 300 MG capsule Take 1 capsule (300 mg) by mouth twice daily and 2 capsules (600 mg) nightly. (Patient not taking: Reported on 10/09/2023) 120 capsule 0   HYDROcodone -acetaminophen  (NORCO/VICODIN) 5-325 MG tablet Take 1 tablet by mouth every 6 (six) hours as needed for moderate pain or severe pain. (Patient not taking: Reported on 10/09/2023)     levETIRAcetam  (KEPPRA ) 500 MG tablet Take 2 tablets every morning, 2 tablets every evening (Patient not taking: Reported on 10/09/2023) 120 tablet 11   lidocaine  (LIDODERM ) 5 % Place 3 patches onto the skin daily. (Patient not taking: Reported on 10/09/2023) 30 patch 0   polyethylene glycol (MIRALAX  / GLYCOLAX )  17 g packet Take 17 g by mouth daily. (Patient not taking: Reported on 10/09/2023) 14 each 0   traZODone  (DESYREL ) 100 MG tablet Take 1 tablet (100 mg total) by mouth at bedtime as needed for sleep. (Patient not taking: Reported on 10/09/2023) 30 tablet 0   No current facility-administered medications on file prior to visit.    ALLERGIES: Allergies  Allergen Reactions   Latex Itching and Rash    FAMILY HISTORY: Family History  Problem Relation Age of Onset   Alzheimer's disease Paternal Grandmother    Cancer Maternal Grandmother    Cancer Maternal Grandfather    Hypertension Father    Anemia Mother    Hypertension Mother    Thyroid disease Mother    Diabetes Sister    Hypertension Sister    Mental illness Brother    Asthma Daughter    Bronchitis Daughter     Asthma Daughter    Bronchitis Daughter    Asthma Daughter    Bronchitis Daughter     SOCIAL HISTORY: Social History   Socioeconomic History   Marital status: Single    Spouse name: Not on file   Number of children: 5   Years of education: Not on file   Highest education level: Not on file  Occupational History   Not on file  Tobacco Use   Smoking status: Every Day    Current packs/day: 0.50    Average packs/day: 0.5 packs/day for 15.0 years (7.5 ttl pk-yrs)    Types: Cigarettes   Smokeless tobacco: Never  Vaping Use   Vaping status: Some Days   Substances: Nicotine  Substance and Sexual Activity   Alcohol use: Yes    Alcohol/week: 14.0 standard drinks of alcohol    Types: 14 Cans of beer per week    Comment: 2 beers daily (malt liquor)   Drug use: Not Currently   Sexual activity: Yes    Birth control/protection: None  Other Topics Concern   Not on file  Social History Narrative   Are you right handed or left handed? Right    Are you currently employed ? No    What is your current occupation?   Do you live at home alone? No    Who lives with you? Family    What type of home do you live in: 1 story or 2 story? 1 level        Social Drivers of Corporate investment banker Strain: Not on file  Food Insecurity: No Food Insecurity (09/28/2022)   Hunger Vital Sign    Worried About Running Out of Food in the Last Year: Never true    Ran Out of Food in the Last Year: Never true  Transportation Needs: No Transportation Needs (09/28/2022)   PRAPARE - Administrator, Civil Service (Medical): No    Lack of Transportation (Non-Medical): No  Physical Activity: Not on file  Stress: Not on file  Social Connections: Not on file  Intimate Partner Violence: Not At Risk (09/28/2022)   Humiliation, Afraid, Rape, and Kick questionnaire    Fear of Current or Ex-Partner: No    Emotionally Abused: No    Physically Abused: No    Sexually Abused: No     PHYSICAL  EXAM: Vitals:   10/09/23 0850  BP: 113/87  Pulse: 68  Resp: 20  SpO2: 95%   General: No acute distress Head:  Normocephalic/atraumatic Skin/Extremities: No rash, no edema Neurological Exam: alert and awake. No aphasia  or dysarthria. Fund of knowledge is appropriate. Attention and concentration are normal.   Cranial nerves: Pupils equal, round. Extraocular movements intact with no nystagmus. Visual fields full.  No facial asymmetry.  Motor: increased tone on left wrist, 3/5 left UE otherwise 5/5 on left UE and both LE. Finger to nose testing intact.  Gait slow and cautious, no ataxia.    IMPRESSION: This is a 38 yo RH woman with a complicated history s/p coil embolization of right ophthalmic artery 02/2022, right MCA aneurysm clipping in 04/2022 complicated by intraoperative rupture and right MCA stroke and subsequent seizures. She denies any GTCs since 05/2023 and appears to have less focal seizures. Continue Levetiracetam  1000mg  BID (500mg : 2 tabs BID). She continues to report pain likely due to spasticity on left upper extremity, proceed with PMR evaluation for consideration for Botox. She was on Diclofenac  cream while at inpatient rehab, refill sent. She asks for medication for depression, we discussed starting Cymbalta  to hopefully help with pain as well, side effects discussed, start 20mg  at bedtime. She was also advised to establish care with a PCP. She does not drive. Follow-up in 3-4 months, call for any changes.   Thank you for allowing me to participate in her care.  Please do not hesitate to call for any questions or concerns.    Kristin Simmons, M.D.

## 2023-10-09 NOTE — Patient Instructions (Addendum)
 Good to see you.  Continue Keppra  500mg : take 2 tablets twice a day  2. Start Cymbalta  (Duloxetine ) 20mg : take 1 capsule every night  3. A refill was sent for Diclofenac  cream to rub on arm for pain  4. Please call Dr. Carilyn office to schedule appointment for consideration for Botox for your hand: [phone]  5. Please stop by next door at [facility] to establish primary care   6. Follow-up in 3-4 months, call for any changes   Seizure Precautions: 1. If medication has been prescribed for you to prevent seizures, take it exactly as directed.  Do not stop taking the medicine without talking to your doctor first, even if you have not had a seizure in a long time.   2. Avoid activities in which a seizure would cause danger to yourself or to others.  Don't operate dangerous machinery, swim alone, or climb in high or dangerous places, such as on ladders, roofs, or girders.  Do not drive unless your doctor says you may.  3. If you have any warning that you may have a seizure, lay down in a safe place where you can't hurt yourself.    4.  No driving for 6 months from last seizure, as per [place]  state law.   Please refer to the following link on the Epilepsy Foundation of America's website for more information: http://www.epilepsyfoundation.org/answerplace/Social/driving/drivingu.cfm   5.  Maintain good sleep hygiene. Avoid alcohol  6.  Notify your neurology if you are planning pregnancy or if you become pregnant.  7.  Contact your doctor if you have any problems that may be related to the medicine you are taking.  8.  Call 911 and bring the patient back to the ED if:        A.  The seizure lasts longer than 5 minutes.       B.  The patient doesn't awaken shortly after the seizure  C.  The patient has new problems such as difficulty seeing, speaking or moving  D.  The patient was injured during the seizure  E.  The patient has a temperature over 102 F  (39C)  F.  The patient vomited and now is having trouble breathing

## 2023-11-02 ENCOUNTER — Encounter: Payer: MEDICAID | Attending: Physical Medicine & Rehabilitation | Admitting: Physical Medicine & Rehabilitation

## 2023-11-02 ENCOUNTER — Encounter: Payer: Self-pay | Admitting: Physical Medicine & Rehabilitation

## 2023-11-02 VITALS — BP 123/88 | HR 73 | Ht 64.0 in | Wt 101.2 lb

## 2023-11-02 DIAGNOSIS — I69834 Monoplegia of upper limb following other cerebrovascular disease affecting left non-dominant side: Secondary | ICD-10-CM | POA: Insufficient documentation

## 2023-11-02 DIAGNOSIS — G8320 Monoplegia of upper limb affecting unspecified side: Secondary | ICD-10-CM | POA: Insufficient documentation

## 2023-11-02 NOTE — Progress Notes (Signed)
 Subjective:    Patient ID: Kristin Simmons, female    DOB: March 19, 1985, 38 y.o.   MRN: [id number]  HPI 38 year old female with history of right MCA distribution infarct who was referred for left hand spasms.  Her MCA infarct was related to a right MCA aneurysm and underwent craniotomy in March 2024.  The patient was on inpatient rehab in May and June 2024.  Additional issues have included seizure disorder She is on levetiracetam  1000 mg twice daily for seizures Noted increased weakness in LUE since seizure. She has no new weakness in the left lower limb.  No new weakness in the oral or pharyngeal musculature.  No double vision.  She remains independent with all self-care and mobility. She is afraid that sensory stimulation left upper extremity trigger seizures. She has been drinking margaritas at night.  We discussed that alcohol can lower seizure threshold and that she needs to take her Keppra  as prescribed and avoid alcohol.  CT HEAD WITHOUT CONTRAST   TECHNIQUE: Contiguous axial images were obtained from the base of the skull through the vertex without intravenous contrast.   RADIATION DOSE REDUCTION: This exam was performed according to the departmental dose-optimization program which includes automated exposure control, adjustment of the mA and/or kV according to patient size and/or use of iterative reconstruction technique.   COMPARISON:  CT 09/28/2022   FINDINGS: Brain: Encephalomalacia deep to the craniotomy flap in the right frontal and temporal lobes. Cortical hyperattenuation in the area of encephalomalacia suggesting laminar necrosis. No evidence of acute infarct. No intracranial hemorrhage or extra-axial fluid collection. No hydrocephalus. Ex vacuo dilatation of the right ventricle.   Vascular: Aneurysm coil in the right circle-of-Willis with associated streak artifact.   Skull: No fracture or focal lesion.   Sinuses/Orbits: No acute finding.   Other: None.    IMPRESSION: Temporal evolution of the encephalomalacia in the right frontal and temporal lobes deep to the craniotomy flap compared to 09/28/2022. No acute abnormality.     Electronically Signed   By: Norman Gatlin M.D.   On: 05/16/2023 03:45 Pain Inventory Average Pain 9 Pain Right Now 7 My pain is constant, tingling, and aching  In the last 24 hours, has pain interfered with the following? General activity 5 Relation with others 7 Enjoyment of life 10 What TIME of day is your pain at its worst? night Sleep (in general) Poor  Pain is worse with: walking and with left arm in downward position Pain improves with: sleeping on it Relief from Meds: 1  walk without assistance how many minutes can you walk? No problem walking ability to climb steps?  yes do you drive?  no Do you have any goals in this area?  yes  I need assistance with the following:  dressing, bathing, and household duties Do you have any goals in this area?  yes  numbness depression  Any changes since last visit?  no  Any changes since last visit?  no    Family History  Problem Relation Age of Onset   Alzheimer's disease Paternal Grandmother    Cancer Maternal Grandmother    Cancer Maternal Grandfather    Hypertension Father    Anemia Mother    Hypertension Mother    Thyroid disease Mother    Diabetes Sister    Hypertension Sister    Mental illness Brother    Asthma Daughter    Bronchitis Daughter    Asthma Daughter    Bronchitis Daughter    Asthma  Daughter    Bronchitis Daughter    Social History   Socioeconomic History   Marital status: Single    Spouse name: Not on file   Number of children: 5   Years of education: Not on file   Highest education level: Not on file  Occupational History   Not on file  Tobacco Use   Smoking status: Every Day    Current packs/day: 0.50    Average packs/day: 0.5 packs/day for 15.0 years (7.5 ttl pk-yrs)    Types: Cigarettes   Smokeless  tobacco: Never  Vaping Use   Vaping status: Never Used  Substance and Sexual Activity   Alcohol use: Yes    Alcohol/week: 14.0 standard drinks of alcohol    Types: 14 Cans of beer per week    Comment: 2 beers daily (malt liquor)   Drug use: Not Currently   Sexual activity: Yes    Birth control/protection: None  Other Topics Concern   Not on file  Social History Narrative   Are you right handed or left handed? Right    Are you currently employed ? No    What is your current occupation?   Do you live at home alone? No    Who lives with you? Family    What type of home do you live in: 1 story or 2 story? 1 level        Social Drivers of Corporate investment banker Strain: Not on file  Food Insecurity: No Food Insecurity (09/28/2022)   Hunger Vital Sign    Worried About Running Out of Food in the Last Year: Never true    Ran Out of Food in the Last Year: Never true  Transportation Needs: No Transportation Needs (09/28/2022)   PRAPARE - Administrator, Civil Service (Medical): No    Lack of Transportation (Non-Medical): No  Physical Activity: Not on file  Stress: Not on file  Social Connections: Not on file   Past Surgical History:  Procedure Laterality Date   CRANIOPLASTY Right 09/20/2022   Procedure: CRANIOPLASTY, HARVEST OF ABDOMINAL BONE FLAP;  Surgeon: Lanis Pupa, MD;  Location: [facility] OR;  Service: Neurosurgery;  Laterality: Right;   CRANIOTOMY Right 05/31/2022   Procedure: RIGHT PTERIONAL CRANIOTOMY FOR CLIPPING OF MIDDLE CEREBRAL ARTERY  ANEURYSM WITH PLACEMENT OF BONEFLAP IN ABDOMEN;  Surgeon: Lanis Pupa, MD;  Location: [facility] OR;  Service: Neurosurgery;  Laterality: Right;   IR 3D INDEPENDENT WKST  03/08/2022   IR ANGIO INTRA EXTRACRAN SEL INTERNAL CAROTID BILAT MOD SED  03/08/2022   IR ANGIO VERTEBRAL SEL VERTEBRAL BILAT MOD SED  03/08/2022   IR ANGIOGRAM FOLLOW UP STUDY  03/08/2022   IR ANGIOGRAM FOLLOW UP STUDY  03/08/2022   IR ANGIOGRAM FOLLOW  UP STUDY  03/08/2022   IR NEURO EACH ADD'L AFTER BASIC UNI RIGHT (MS)  03/08/2022   IR TRANSCATH/EMBOLIZ  03/08/2022   RADIOLOGY WITH ANESTHESIA N/A 03/08/2022   Procedure: Diagnostic angiogram, Possible Coil Embolization of Aneurysm;  Surgeon: Lanis Pupa, MD;  Location: [facility] OR;  Service: Radiology;  Laterality: N/A;   TUBAL LIGATION Bilateral 03/27/2018   Procedure: POST PARTUM TUBAL LIGATION;  Surgeon: Barbra Lang PARAS, DO;  Location: [facility];  Service: Gynecology;  Laterality: Bilateral;   Past Medical History:  Diagnosis Date   Anemia    Anxiety    Arthritis    Asthma    Cancer (HCC)    cervical   Depression    Headache  Hypertension    She was taking her mother's blood pressure meds but states she doesn't need bp meds now.   Vaginal Pap smear, abnormal    BP 123/88   Pulse 73   Ht 5' 4 (1.626 m)   Wt 101 lb 3.2 oz (45.9 kg)   SpO2 97%   BMI 17.37 kg/m   Opioid Risk Score:   Fall Risk Score:  `1  Depression screen PHQ 2/9     11/02/2023    1:34 PM 10/19/2017    2:35 PM 12/08/2015    3:41 PM  Depression screen PHQ 2/9  Decreased Interest 1 0 2  Down, Depressed, Hopeless 3 0 3  PHQ - 2 Score 4 0 5  Altered sleeping 1 3 1   Tired, decreased energy 1 0 2  Change in appetite 0 1 1  Feeling bad or failure about yourself  3 0 1  Trouble concentrating 2 0 1  Moving slowly or fidgety/restless 1 0 0  Suicidal thoughts 3 0 2   PHQ-9 Score 15 4 13      Data saved with a previous flowsheet row definition    Review of Systems  Musculoskeletal:         Left hand pain, not able to move the left hand  All other systems reviewed and are negative.      Objective:   Physical Exam  General no acute distress Mood affect appropriate Speech without dysarthria or aphasia Extraocular muscles are intact No facial droop Visual fields are intact Left upper extremity strength is 3 - at the deltoid bicep tricep finger flexors and extensors She has mildly  diminished fine motor in the left upper extremity. She has a 1 fingerbreadth shoulder subluxation left upper extremity. Left lower extremity has normal strength and normal tone. She ambulates without assistive device no evidence of toe drag or knee instability Right upper limb and right lower limb normal strength and tone. Left upper limb tone MAS 0 at the elbow flexors wrist flexors finger flexors No evidence of clonus at the wrist or fingers      Assessment & Plan:   1.  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  She has made an excellent functional recovery but is still concerned that her left upper extremity strength has not improved.  We discussed that given the timeframe since her stroke with no further functional recovery is expected.  She was hoping that Botox injections could restore her strength. We discussed that her tone is normal she does not have evidence of excessive spasticity and that Botox would not have any beneficial effect for her. The patient will follow-up with neurology in December.

## 2024-01-25 ENCOUNTER — Telehealth: Payer: Self-pay | Admitting: Neurology

## 2024-01-25 DIAGNOSIS — I69834 Monoplegia of upper limb following other cerebrovascular disease affecting left non-dominant side: Secondary | ICD-10-CM

## 2024-01-25 NOTE — Telephone Encounter (Signed)
 Pt thought here next appt was for Botox, she is inquiring why its not and how she can start. Call back for next steps

## 2024-01-26 NOTE — Telephone Encounter (Signed)
 Pt called no answer left a voice mail to call the office back

## 2024-01-26 NOTE — Telephone Encounter (Signed)
 Pls let her know that I reviewed the notes from Dr. Carilyn who performs the Botox and he did not feel that it would help her. At this point, we will need to start physical and occupational therapy again for her arm to hopefully help better. We can try muscle relaxant again, it can make her drowsy but if she wants to try, we can start it. Thanks

## 2024-01-29 ENCOUNTER — Telehealth: Payer: Self-pay | Admitting: Neurology

## 2024-01-29 MED ORDER — BACLOFEN 5 MG PO TABS: ORAL_TABLET | ORAL | 4 refills | Status: AC

## 2024-01-29 NOTE — Addendum Note (Signed)
 Addended by: TAFT MOATS on: 01/29/2024 02:56 PM   Modules accepted: Orders

## 2024-01-29 NOTE — Telephone Encounter (Signed)
 I sent in Rx for Baclofen  5mg : take 1 tablet as needed at night to help with muscle spasms. It may make her drowsy so we are starting a low dose, but if no side effects, it can be taken three times a day as needed.

## 2024-01-29 NOTE — Telephone Encounter (Signed)
 Pt called no answer left a voice mail to call the office back

## 2024-01-29 NOTE — Addendum Note (Signed)
 Addended by: GEORJEAN DARICE HERO on: 01/29/2024 03:12 PM   Modules accepted: Orders

## 2024-01-29 NOTE — Telephone Encounter (Signed)
 Pt calling back

## 2024-01-29 NOTE — Telephone Encounter (Signed)
 Pt called an informed that Dr Georjean reviewed the notes from Dr. Carilyn who performs the Botox and he did not feel that it would help her. At this point, we will need to start physical and occupational therapy again for her arm to hopefully help better. We can try muscle relaxant again, it can make her drowsy but if she wants to try, we can start it. Pt asked how does he know if we didn't try it? She was advised that he read the information that was in her chart an made his decision. She then stated what it sounded like 'everybody in that building goin get me paid' her daughter asked her mama do you want the muscle relaxant she stated yes she wants them. I asked her again would she like to do the physical and occupational therapy she said yes orders have been placed. Pt was advised that they will call her to get an appointment scheduled,

## 2024-01-30 ENCOUNTER — Ambulatory Visit: Payer: MEDICAID | Admitting: Neurology

## 2024-01-30 ENCOUNTER — Encounter: Payer: Self-pay | Admitting: Neurology

## 2024-01-30 VITALS — BP 114/83 | HR 78 | Ht 64.0 in | Wt 104.6 lb

## 2024-01-30 DIAGNOSIS — G40209 Localization-related (focal) (partial) symptomatic epilepsy and epileptic syndromes with complex partial seizures, not intractable, without status epilepticus: Secondary | ICD-10-CM

## 2024-01-30 DIAGNOSIS — M792 Neuralgia and neuritis, unspecified: Secondary | ICD-10-CM

## 2024-01-30 MED ORDER — DULOXETINE HCL 20 MG PO CPEP: ORAL_CAPSULE | ORAL | 6 refills | Status: AC

## 2024-01-30 MED ORDER — GABAPENTIN 100 MG PO CAPS: 100.0000 mg | ORAL_CAPSULE | Freq: Every day | ORAL | 5 refills | Status: AC

## 2024-01-30 MED ORDER — LEVETIRACETAM 500 MG PO TABS: ORAL_TABLET | ORAL | 11 refills | Status: AC

## 2024-01-30 NOTE — Patient Instructions (Addendum)
 Good to see you.   Referral will be sent for Occupational therapy of the left hand Address: [address], [place], [place] [address] Phone: [phone]  2. Start Gabapentin  100mg : take 1 capsule every night to hopefully help with hand pain  3. Try the Baclofen  5mg  as needed for stiffness/pain (can take three times a day as needed)  4. Take the Keppra  500mg  2 tablets at 6am, 2 tablets at 6pm  5. Take the Duloxetine  20mg  at bedtime  6. Follow-up in 4 months, call for any changes   Seizure Precautions: 1. If medication has been prescribed for you to prevent seizures, take it exactly as directed.  Do not stop taking the medicine without talking to your doctor first, even if you have not had a seizure in a long time.   2. Avoid activities in which a seizure would cause danger to yourself or to others.  Don't operate dangerous machinery, swim alone, or climb in high or dangerous places, such as on ladders, roofs, or girders.  Do not drive unless your doctor says you may.  3. If you have any warning that you may have a seizure, lay down in a safe place where you can't hurt yourself.    4.  No driving for 6 months from last seizure, as per [place]  state law.   Please refer to the following link on the [employer]ile[employer] of [employer]'s website for more information: http://www.epilepsyfoundation.org/answerplace/Social/driving/drivingu.cfm   5.  Maintain good sleep hygiene. Avoid alcohol.  6.  Notify your neurology if you are planning pregnancy or if you become pregnant.  7.  Contact your doctor if you have any problems that may be related to the medicine you are taking.  8.  Call 911 and bring the patient back to the ED if:        A.  The seizure lasts longer than 5 minutes.       B.  The patient doesn't awaken shortly after the seizure  C.  The patient has new problems such as difficulty seeing, speaking or moving  D.  The patient was injured during the seizure  E.  The patient  has a temperature over 102 F (39C)  F.  The patient vomited and now is having trouble breathing

## 2024-01-30 NOTE — Progress Notes (Unsigned)
 NEUROLOGY FOLLOW UP OFFICE NOTE  Kristin Simmons [id number] 15-Mar-1985  HISTORY OF PRESENT ILLNESS: I had the pleasure of seeing Kristin Simmons in follow-up in the neurology clinic on 01/30/2024.  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 She is accompanied by her mother who helps supplement the history today.  Records and images were personally reviewed where available. Since her last visit, she denies any convulsions since 05/2023. She reports occasional times she has small ones where she feels it coming on in her right hand, she alerts her daughter who applies an ice pack and the symptoms resolve. Last episode was 2 weeks ago. She takes the Levetiracetam  1000mg  at 6am and then at 2:30pm. She has headaches that can last all day, she takes an additional dose of Levetiracetam  which puts her to sleep to help with the headaches. She asked about depression medication on last visit, Duloxetine  20mg  was restarted, she takes it in the morning and it makes her drowsy. She continues to have mood swings that her mother also has noticed.  She continues to report numbness and pain on the left hand. When hand is moved, she has pain in the fingers. She also has numbness in the toes of her left foot. She wears an elastic wrist brace and asks for another brace. She was kindly evaluated by PMR specialist Dr. Carilyn evaluation indicated that tone is normal, she does not have evidence of excessive spasticity, and Botox would not have any beneficial effect for her. She is unhappy with this assessment and wanted a second opinion, however her mother cannot drive her further away from [place]. She has not yet started the Baclofen .   History on Initial Assessment 07/20/2023: This is a 38 year old right-handed woman with a complicated history presenting for evaluation of seizures. Records were reviewed. 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 In 04/2022, she underwent elective right craniotomy for clipping of MCA aneurysm, complicated by intraoperative rupture requiring mechanical ventilation. 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 She had overnight EEG at that time for left-sided movements, EEG showed continuous slowing over the right hemisphere. She had residual left upper extremity weakness and was in inpatient rehab from May to June 2024 and started on Neurontin  300mg  in AM, 600mg  in PM (no longer taking). She was in the ER in 09/2022 for rhythmic flapping motion of the left hand, less alert, episode lasted a few seconds. Admission was recommended but she signed out AMA. Five days later, she was brought back to the ER for seizure, confused on EMS arrival. While in the CT scanner, she had a generalized tonic-clonic seizure lasting 30-40 seconds. She was discharged home on Levetiracetam  500mg  BID. She was in the ER on 05/16/23 after 3 seizures, one witnessed by EMS. Her daughter reports her left hand and left side of face locked up followed by convulsion. Per notes, she was taken off medication 4 days prior/prescribing physician stopped giving refills. Head CT no acute changes, there was encephalomalacia deep to the craniotomy flap in  the right frontal and temporal lobes, cortical hyperattenuation in the area of encephalomalacia suggesting laminar necrosis. She was obtunded for 3.5 hours, then once she woke up another 2 hours later, she requested discharged and left AMA with a 2 month supply of Levetiracetam  500mg  BID. She  states she ran out a week ago because she would take an additional dose up to 4 times in the day as needed when she feels a seizure described as left arm/hand cramping badly. She denies any loss of consciousness, however her daughter reports she would be unresponsive when her left hand locked up, she thinks the last episode was 1-2 months ago. Last GTC was the one in April. Her daughter denies any isolated staring spells but she endorses losing time several times a day, forgetting a lot.   She denies any olfactory/gustatory hallucinations, rising epigastric sensation, myoclonic jerks. She has numbness on the tip of her fingers on the right hand, left hand also feels numb. She denies any cramping on the right or on her lower extremities. She has frequent right frontal headaches occurring 2-3 times a day. It feels like someone cutting it up. She feels the LEV also helps with the headaches, makes me sleep. They don't last long, gone when she wakes up. No associated nausea/vomiting. No dizziness, diplopia, dysarthria/dysphagia, neck/back pain, bowel/bladder dysfunction. She has heavy periods. She gets 7-8 hours of sleep. She has 5 children at home. Mood is fine. She reports she does not have a PCP to prescribe her medications, Amlodipine  is on her medication list, BP today normal 127/83. She reports the left wrist aches but is not currently cramping.   Epilepsy Risk Factors:  right MCA stroke s/p craniotomy. She had a normal birth and early development.  There is no history of febrile convulsions, CNS infections such as meningitis/encephalitis, significant traumatic brain injury, or family history of seizures.   PAST MEDICAL HISTORY: Past Medical History:  Diagnosis Date   Anemia    Anxiety    Arthritis    Asthma    Cancer (HCC)    cervical   Depression    Headache    Hypertension    She was taking her mother's blood pressure meds but states she doesn't need bp meds now.   Vaginal Pap smear,  abnormal     MEDICATIONS: Medications Ordered Prior to Encounter[1]  ALLERGIES: Allergies[2]  FAMILY HISTORY: Family History  Problem Relation Age of Onset   Alzheimer's disease Paternal Grandmother    Cancer Maternal Grandmother    Cancer Maternal Grandfather    Hypertension Father    Anemia Mother    Hypertension Mother    Thyroid disease Mother    Diabetes Sister    Hypertension Sister    Mental illness Brother    Asthma Daughter    Bronchitis Daughter    Asthma Daughter    Bronchitis Daughter    Asthma Daughter    Bronchitis Daughter     SOCIAL HISTORY: Social History   Socioeconomic History   Marital status: Single    Spouse name: Not on file   Number of children: 5   Years of education: Not on file   Highest education level: Not on file  Occupational History   Not on file  Tobacco Use   Smoking status: Every Day    Current packs/day: 0.50    Average packs/day: 0.5 packs/day for 15.0 years (7.5 ttl pk-yrs)    Types: Cigarettes   Smokeless tobacco: Never  Vaping Use  Vaping status: Some Days  Substance and Sexual Activity   Alcohol use: Yes    Alcohol/week: 14.0 standard drinks of alcohol    Types: 14 Cans of beer per week    Comment: 2 beers daily (malt liquor)   Drug use: Not Currently   Sexual activity: Yes    Birth control/protection: None  Other Topics Concern   Not on file  Social History Narrative   Are you right handed or left handed? Right    Are you currently employed ? No    What is your current occupation?   Do you live at home alone? No    Who lives with you? Family    What type of home do you live in: 1 story or 2 story? 1 level        Social Drivers of Health   Tobacco Use: High Risk (01/30/2024)   Patient History    Smoking Tobacco Use: Every Day    Smokeless Tobacco Use: Never    Passive Exposure: Not on file  Financial Resource Strain: Not on file  Food Insecurity: No Food Insecurity (09/28/2022)   Hunger Vital Sign     Worried About Running Out of Food in the Last Year: Never true    Ran Out of Food in the Last Year: Never true  Transportation Needs: No Transportation Needs (09/28/2022)   PRAPARE - Administrator, Civil Service (Medical): No    Lack of Transportation (Non-Medical): No  Physical Activity: Not on file  Stress: Not on file  Social Connections: Not on file  Intimate Partner Violence: Not At Risk (09/28/2022)   Humiliation, Afraid, Rape, and Kick questionnaire    Fear of Current or Ex-Partner: No    Emotionally Abused: No    Physically Abused: No    Sexually Abused: No  Depression (PHQ2-9): High Risk (11/02/2023)   Depression (PHQ2-9)    PHQ-2 Score: 15  Alcohol Screen: Not on file  Housing: Medium Risk (09/28/2022)   Housing    Last Housing Risk Score: 1  Utilities: At Risk (09/28/2022)   AHC Utilities    Threatened with loss of utilities: Yes  Health Literacy: Not on file     PHYSICAL EXAM: Vitals:   01/30/24 1528  BP: 114/83  Pulse: 78  SpO2: 100%   General: No acute distress Head:  Normocephalic/atraumatic Skin/Extremities: No rash, no edema Neurological Exam: alert and awake. No aphasia or dysarthria. Fund of knowledge is appropriate.  Attention and concentration are normal.   Cranial nerves: Pupils equal, round. Extraocular movements intact with no nystagmus. Visual fields full.  No facial asymmetry.  Motor: 5/5 right UE and LE, 3/5 left UE, 5/5 left LE.  Finger to nose testing intact on right.  Gait slow and cautious, no ataxia. No tremors.    IMPRESSION: This is a 38 yo RH woman with a complicated history s/p coil embolization of right ophthalmic artery 02/2022, right MCA aneurysm clipping in 04/2022 complicated by intraoperative rupture and right MCA stroke and subsequent seizures. She denies any GTCs since 05/2023. She reports applying an ice pack when she has smaller episodes with the right hand does help. She is not a candidate for Botox, per Dr. Carilyn. We  discussed doing occupational therapy for her left hand, she is asking about a brace. Main issue appears to be pain, she will do a trial of Gabepentin 100mg  at bedtime, side effects discussed. She can try prn Baclofen  for the hand stiffness, can be  taken up to three times a  day as needed. She was instructed to take the Cymbalta  20mg  at bedtime instead to help with the drowsiness. COntinue Levetiracetam  1000mg  BID, instructed to take 10-12 hours apart. She does not drive. Follow-up in 4 months, call for any changes.   Thank you for allowing me to participate in her care.  Please do not hesitate to call for any questions or concerns.    Darice Shivers, M.D.         [1]  Current Outpatient Medications on File Prior to Visit  Medication Sig Dispense Refill   Baclofen  5 MG TABS Take 1 tablet as needed at bedtime for muscle spasms 30 tablet 4   diclofenac  Sodium (VOLTAREN ) 1 % GEL Apply 2 g topically 4 (four) times daily. 200 g 0   No current facility-administered medications on file prior to visit.  [2]  Allergies Allergen Reactions   Latex Itching and Rash

## 2024-01-30 NOTE — Telephone Encounter (Signed)
 Pt called informed that Dr Georjean  sent in Rx for Baclofen  5mg : take 1 tablet as needed at night to help with muscle spasms. It may make her drowsy so we are starting a low dose, but if no side effects, it can be taken three times a day as needed.

## 2024-01-31 ENCOUNTER — Telehealth: Payer: Self-pay | Admitting: Neurology

## 2024-01-31 NOTE — Telephone Encounter (Signed)
 Called the pharmacy they have her RX ready for pick up I called pt told her

## 2024-01-31 NOTE — Telephone Encounter (Signed)
 Pt called in this morning and  she stated that she was told that the Dr. Leroy  send a prescription call  gabapentin  (NEURONTIN ) 100 MG capsul  to [facility]  on [address]. Thanks

## 2024-02-01 ENCOUNTER — Encounter: Payer: Self-pay | Admitting: Neurology

## 2024-06-11 ENCOUNTER — Ambulatory Visit: Payer: Self-pay | Admitting: Neurology
